# Patient Record
Sex: Female | Born: 1950 | Race: Black or African American | Hispanic: No | Smoking: Former smoker
Health system: Southern US, Community
[De-identification: ages and names within clinical notes are randomized; demographics above are authoritative.]

## PROBLEM LIST (undated history)

## (undated) DIAGNOSIS — I219 Acute myocardial infarction, unspecified: Secondary | ICD-10-CM

## (undated) DIAGNOSIS — J45909 Unspecified asthma, uncomplicated: Secondary | ICD-10-CM

## (undated) DIAGNOSIS — K635 Polyp of colon: Secondary | ICD-10-CM

## (undated) DIAGNOSIS — G8929 Other chronic pain: Secondary | ICD-10-CM

## (undated) DIAGNOSIS — Z95810 Presence of automatic (implantable) cardiac defibrillator: Secondary | ICD-10-CM

## (undated) DIAGNOSIS — E041 Nontoxic single thyroid nodule: Secondary | ICD-10-CM

## (undated) DIAGNOSIS — I209 Angina pectoris, unspecified: Secondary | ICD-10-CM

## (undated) DIAGNOSIS — N183 Chronic kidney disease, stage 3 unspecified: Secondary | ICD-10-CM

## (undated) DIAGNOSIS — I509 Heart failure, unspecified: Secondary | ICD-10-CM

## (undated) DIAGNOSIS — M1990 Unspecified osteoarthritis, unspecified site: Secondary | ICD-10-CM

## (undated) DIAGNOSIS — R519 Headache, unspecified: Secondary | ICD-10-CM

## (undated) DIAGNOSIS — J449 Chronic obstructive pulmonary disease, unspecified: Secondary | ICD-10-CM

## (undated) DIAGNOSIS — K56609 Unspecified intestinal obstruction, unspecified as to partial versus complete obstruction: Secondary | ICD-10-CM

## (undated) DIAGNOSIS — K589 Irritable bowel syndrome without diarrhea: Secondary | ICD-10-CM

## (undated) DIAGNOSIS — I2510 Atherosclerotic heart disease of native coronary artery without angina pectoris: Secondary | ICD-10-CM

## (undated) DIAGNOSIS — M797 Fibromyalgia: Secondary | ICD-10-CM

## (undated) HISTORY — PX: ABDOMINAL HYSTERECTOMY: SHX81

## (undated) HISTORY — DX: Other chronic pain: G89.29

## (undated) HISTORY — DX: Unspecified osteoarthritis, unspecified site: M19.90

## (undated) HISTORY — DX: Polyp of colon: K63.5

## (undated) HISTORY — DX: Chronic obstructive pulmonary disease, unspecified: J44.9

## (undated) HISTORY — DX: Acute myocardial infarction, unspecified: I21.9

## (undated) HISTORY — DX: Atherosclerotic heart disease of native coronary artery without angina pectoris: I25.10

## (undated) HISTORY — DX: Unspecified intestinal obstruction, unspecified as to partial versus complete obstruction: K56.609

## (undated) HISTORY — DX: Irritable bowel syndrome, unspecified: K58.9

## (undated) HISTORY — PX: BLEPHAROPLASTY: SUR158

## (undated) HISTORY — DX: Unspecified asthma, uncomplicated: J45.909

## (undated) HISTORY — DX: Headache, unspecified: R51.9

## (undated) HISTORY — PX: COLON SURGERY: SHX602

## (undated) HISTORY — PX: REFRACTIVE SURGERY: SHX103

## (undated) MED FILL — Ferumoxytol Inj 510 MG/17ML (30 MG/ML) (Elemental Fe): INTRAVENOUS | Qty: 17 | Status: AC

---

## 2012-04-19 DIAGNOSIS — H02409 Unspecified ptosis of unspecified eyelid: Secondary | ICD-10-CM | POA: Insufficient documentation

## 2013-08-16 DIAGNOSIS — Z8249 Family history of ischemic heart disease and other diseases of the circulatory system: Secondary | ICD-10-CM | POA: Insufficient documentation

## 2013-08-16 HISTORY — DX: Family history of ischemic heart disease and other diseases of the circulatory system: Z82.49

## 2013-12-08 DIAGNOSIS — E1122 Type 2 diabetes mellitus with diabetic chronic kidney disease: Secondary | ICD-10-CM | POA: Insufficient documentation

## 2013-12-08 DIAGNOSIS — N1832 Chronic kidney disease, stage 3b: Secondary | ICD-10-CM | POA: Insufficient documentation

## 2013-12-08 DIAGNOSIS — E119 Type 2 diabetes mellitus without complications: Secondary | ICD-10-CM

## 2013-12-08 HISTORY — DX: Type 2 diabetes mellitus without complications: E11.9

## 2014-04-13 DIAGNOSIS — I2510 Atherosclerotic heart disease of native coronary artery without angina pectoris: Secondary | ICD-10-CM

## 2014-04-13 HISTORY — DX: Atherosclerotic heart disease of native coronary artery without angina pectoris: I25.10

## 2014-11-08 DIAGNOSIS — H409 Unspecified glaucoma: Secondary | ICD-10-CM

## 2014-11-08 DIAGNOSIS — R3915 Urgency of urination: Secondary | ICD-10-CM | POA: Insufficient documentation

## 2014-11-08 DIAGNOSIS — R351 Nocturia: Secondary | ICD-10-CM | POA: Insufficient documentation

## 2014-11-08 DIAGNOSIS — Z90710 Acquired absence of both cervix and uterus: Secondary | ICD-10-CM | POA: Insufficient documentation

## 2014-11-08 DIAGNOSIS — G43909 Migraine, unspecified, not intractable, without status migrainosus: Secondary | ICD-10-CM

## 2014-11-08 DIAGNOSIS — N952 Postmenopausal atrophic vaginitis: Secondary | ICD-10-CM

## 2014-11-08 DIAGNOSIS — N3941 Urge incontinence: Secondary | ICD-10-CM | POA: Insufficient documentation

## 2014-11-08 DIAGNOSIS — I4949 Other premature depolarization: Secondary | ICD-10-CM

## 2014-11-08 DIAGNOSIS — R3982 Chronic bladder pain: Secondary | ICD-10-CM

## 2014-11-08 DIAGNOSIS — I10 Essential (primary) hypertension: Secondary | ICD-10-CM

## 2014-11-08 HISTORY — DX: Chronic bladder pain: R39.82

## 2014-11-08 HISTORY — DX: Unspecified glaucoma: H40.9

## 2014-11-08 HISTORY — DX: Essential (primary) hypertension: I10

## 2014-11-08 HISTORY — DX: Migraine, unspecified, not intractable, without status migrainosus: G43.909

## 2014-11-08 HISTORY — DX: Other premature depolarization: I49.49

## 2014-11-08 HISTORY — DX: Postmenopausal atrophic vaginitis: N95.2

## 2014-12-22 DIAGNOSIS — R109 Unspecified abdominal pain: Secondary | ICD-10-CM | POA: Insufficient documentation

## 2014-12-22 DIAGNOSIS — R1030 Lower abdominal pain, unspecified: Secondary | ICD-10-CM | POA: Insufficient documentation

## 2014-12-22 DIAGNOSIS — K5904 Chronic idiopathic constipation: Secondary | ICD-10-CM

## 2014-12-22 HISTORY — DX: Chronic idiopathic constipation: K59.04

## 2016-01-22 DIAGNOSIS — I10 Essential (primary) hypertension: Secondary | ICD-10-CM | POA: Insufficient documentation

## 2016-01-22 HISTORY — DX: Essential (primary) hypertension: I10

## 2017-04-07 DIAGNOSIS — R079 Chest pain, unspecified: Secondary | ICD-10-CM | POA: Insufficient documentation

## 2017-04-08 DIAGNOSIS — I739 Peripheral vascular disease, unspecified: Secondary | ICD-10-CM | POA: Insufficient documentation

## 2017-04-08 HISTORY — DX: Peripheral vascular disease, unspecified: I73.9

## 2018-03-18 DIAGNOSIS — K5732 Diverticulitis of large intestine without perforation or abscess without bleeding: Secondary | ICD-10-CM | POA: Insufficient documentation

## 2018-03-18 DIAGNOSIS — J449 Chronic obstructive pulmonary disease, unspecified: Secondary | ICD-10-CM

## 2018-03-18 HISTORY — DX: Diverticulitis of large intestine without perforation or abscess without bleeding: K57.32

## 2018-03-18 HISTORY — DX: Chronic obstructive pulmonary disease, unspecified: J44.9

## 2018-05-20 DIAGNOSIS — K5792 Diverticulitis of intestine, part unspecified, without perforation or abscess without bleeding: Secondary | ICD-10-CM | POA: Insufficient documentation

## 2018-05-20 HISTORY — DX: Diverticulitis of intestine, part unspecified, without perforation or abscess without bleeding: K57.92

## 2018-05-28 DIAGNOSIS — D72829 Elevated white blood cell count, unspecified: Secondary | ICD-10-CM | POA: Insufficient documentation

## 2018-05-28 HISTORY — DX: Elevated white blood cell count, unspecified: D72.829

## 2018-12-01 HISTORY — PX: CORONARY ANGIOPLASTY WITH STENT PLACEMENT: SHX49

## 2019-01-13 DIAGNOSIS — M5030 Other cervical disc degeneration, unspecified cervical region: Secondary | ICD-10-CM

## 2019-01-13 DIAGNOSIS — G8929 Other chronic pain: Secondary | ICD-10-CM

## 2019-01-13 DIAGNOSIS — K219 Gastro-esophageal reflux disease without esophagitis: Secondary | ICD-10-CM | POA: Insufficient documentation

## 2019-01-13 DIAGNOSIS — D509 Iron deficiency anemia, unspecified: Secondary | ICD-10-CM | POA: Insufficient documentation

## 2019-01-13 DIAGNOSIS — Z720 Tobacco use: Secondary | ICD-10-CM | POA: Insufficient documentation

## 2019-01-13 DIAGNOSIS — Z9889 Other specified postprocedural states: Secondary | ICD-10-CM | POA: Insufficient documentation

## 2019-01-13 DIAGNOSIS — E782 Mixed hyperlipidemia: Secondary | ICD-10-CM | POA: Insufficient documentation

## 2019-01-13 DIAGNOSIS — M5442 Lumbago with sciatica, left side: Secondary | ICD-10-CM | POA: Insufficient documentation

## 2019-01-13 HISTORY — DX: Iron deficiency anemia, unspecified: D50.9

## 2019-01-13 HISTORY — DX: Other cervical disc degeneration, unspecified cervical region: M50.30

## 2019-01-13 HISTORY — DX: Mixed hyperlipidemia: E78.2

## 2019-01-13 HISTORY — DX: Other chronic pain: G89.29

## 2019-01-13 HISTORY — DX: Gastro-esophageal reflux disease without esophagitis: K21.9

## 2019-01-13 HISTORY — DX: Lumbago with sciatica, left side: M54.42

## 2019-01-23 DIAGNOSIS — Z7689 Persons encountering health services in other specified circumstances: Secondary | ICD-10-CM | POA: Insufficient documentation

## 2019-01-23 DIAGNOSIS — E559 Vitamin D deficiency, unspecified: Secondary | ICD-10-CM | POA: Insufficient documentation

## 2019-01-23 DIAGNOSIS — Z01810 Encounter for preprocedural cardiovascular examination: Secondary | ICD-10-CM | POA: Insufficient documentation

## 2019-02-22 DIAGNOSIS — Z8619 Personal history of other infectious and parasitic diseases: Secondary | ICD-10-CM | POA: Insufficient documentation

## 2019-02-22 DIAGNOSIS — E889 Metabolic disorder, unspecified: Secondary | ICD-10-CM

## 2019-02-22 DIAGNOSIS — J302 Other seasonal allergic rhinitis: Secondary | ICD-10-CM | POA: Insufficient documentation

## 2019-02-22 DIAGNOSIS — G63 Polyneuropathy in diseases classified elsewhere: Secondary | ICD-10-CM

## 2019-02-22 DIAGNOSIS — E13319 Other specified diabetes mellitus with unspecified diabetic retinopathy without macular edema: Secondary | ICD-10-CM

## 2019-02-22 HISTORY — DX: Polyneuropathy in diseases classified elsewhere: G63

## 2019-02-22 HISTORY — DX: Other specified diabetes mellitus with unspecified diabetic retinopathy without macular edema: E13.319

## 2019-02-22 HISTORY — DX: Metabolic disorder, unspecified: E88.9

## 2019-02-24 ENCOUNTER — Other Ambulatory Visit: Payer: Self-pay | Admitting: Orthopedic Surgery

## 2019-02-24 DIAGNOSIS — M5416 Radiculopathy, lumbar region: Secondary | ICD-10-CM

## 2019-02-24 DIAGNOSIS — E1142 Type 2 diabetes mellitus with diabetic polyneuropathy: Secondary | ICD-10-CM

## 2019-02-24 HISTORY — DX: Type 2 diabetes mellitus with diabetic polyneuropathy: E11.42

## 2019-03-29 DIAGNOSIS — F331 Major depressive disorder, recurrent, moderate: Secondary | ICD-10-CM | POA: Insufficient documentation

## 2019-04-06 DIAGNOSIS — S99922A Unspecified injury of left foot, initial encounter: Secondary | ICD-10-CM | POA: Insufficient documentation

## 2019-04-12 DIAGNOSIS — Z4889 Encounter for other specified surgical aftercare: Secondary | ICD-10-CM | POA: Insufficient documentation

## 2019-04-20 ENCOUNTER — Other Ambulatory Visit: Payer: Medicare Other

## 2019-04-20 DIAGNOSIS — M7061 Trochanteric bursitis, right hip: Secondary | ICD-10-CM

## 2019-04-20 HISTORY — DX: Trochanteric bursitis, right hip: M70.61

## 2019-05-31 DIAGNOSIS — I519 Heart disease, unspecified: Secondary | ICD-10-CM

## 2019-05-31 HISTORY — DX: Heart disease, unspecified: I51.9

## 2019-08-06 DIAGNOSIS — R739 Hyperglycemia, unspecified: Secondary | ICD-10-CM

## 2019-08-06 DIAGNOSIS — I161 Hypertensive emergency: Secondary | ICD-10-CM | POA: Insufficient documentation

## 2019-08-06 DIAGNOSIS — I249 Acute ischemic heart disease, unspecified: Secondary | ICD-10-CM

## 2019-08-06 DIAGNOSIS — I447 Left bundle-branch block, unspecified: Secondary | ICD-10-CM

## 2019-08-06 DIAGNOSIS — I160 Hypertensive urgency: Secondary | ICD-10-CM

## 2019-09-12 DIAGNOSIS — J329 Chronic sinusitis, unspecified: Secondary | ICD-10-CM | POA: Insufficient documentation

## 2019-09-12 DIAGNOSIS — R5383 Other fatigue: Secondary | ICD-10-CM | POA: Insufficient documentation

## 2019-10-05 DIAGNOSIS — R079 Chest pain, unspecified: Secondary | ICD-10-CM

## 2019-11-05 DIAGNOSIS — G629 Polyneuropathy, unspecified: Secondary | ICD-10-CM | POA: Insufficient documentation

## 2019-11-05 DIAGNOSIS — F32A Depression, unspecified: Secondary | ICD-10-CM | POA: Insufficient documentation

## 2019-11-05 HISTORY — DX: Depression, unspecified: F32.A

## 2019-11-08 DIAGNOSIS — N2889 Other specified disorders of kidney and ureter: Secondary | ICD-10-CM

## 2019-11-08 HISTORY — DX: Other specified disorders of kidney and ureter: N28.89

## 2019-11-15 DIAGNOSIS — Z7901 Long term (current) use of anticoagulants: Secondary | ICD-10-CM | POA: Insufficient documentation

## 2019-11-15 DIAGNOSIS — Z7982 Long term (current) use of aspirin: Secondary | ICD-10-CM | POA: Insufficient documentation

## 2019-12-12 DIAGNOSIS — D649 Anemia, unspecified: Secondary | ICD-10-CM | POA: Insufficient documentation

## 2019-12-12 DIAGNOSIS — R7989 Other specified abnormal findings of blood chemistry: Secondary | ICD-10-CM

## 2019-12-12 DIAGNOSIS — Z862 Personal history of diseases of the blood and blood-forming organs and certain disorders involving the immune mechanism: Secondary | ICD-10-CM | POA: Insufficient documentation

## 2019-12-12 HISTORY — DX: Anemia, unspecified: D64.9

## 2019-12-12 HISTORY — DX: Other specified abnormal findings of blood chemistry: R79.89

## 2019-12-27 DIAGNOSIS — L600 Ingrowing nail: Secondary | ICD-10-CM | POA: Insufficient documentation

## 2020-01-02 DIAGNOSIS — D509 Iron deficiency anemia, unspecified: Secondary | ICD-10-CM

## 2020-01-07 DIAGNOSIS — R197 Diarrhea, unspecified: Secondary | ICD-10-CM | POA: Insufficient documentation

## 2020-01-14 DIAGNOSIS — M2550 Pain in unspecified joint: Secondary | ICD-10-CM | POA: Insufficient documentation

## 2020-01-14 DIAGNOSIS — M797 Fibromyalgia: Secondary | ICD-10-CM | POA: Insufficient documentation

## 2020-01-14 HISTORY — DX: Fibromyalgia: M79.7

## 2020-01-14 HISTORY — DX: Pain in unspecified joint: M25.50

## 2020-01-24 DIAGNOSIS — M79674 Pain in right toe(s): Secondary | ICD-10-CM | POA: Insufficient documentation

## 2020-03-13 DIAGNOSIS — H00014 Hordeolum externum left upper eyelid: Secondary | ICD-10-CM

## 2020-03-13 DIAGNOSIS — E538 Deficiency of other specified B group vitamins: Secondary | ICD-10-CM

## 2020-03-13 DIAGNOSIS — R7989 Other specified abnormal findings of blood chemistry: Secondary | ICD-10-CM

## 2020-03-13 HISTORY — DX: Hordeolum externum left upper eyelid: H00.014

## 2020-03-13 HISTORY — DX: Deficiency of other specified B group vitamins: E53.8

## 2020-03-13 HISTORY — DX: Other specified abnormal findings of blood chemistry: R79.89

## 2020-04-02 DIAGNOSIS — D509 Iron deficiency anemia, unspecified: Secondary | ICD-10-CM

## 2020-04-11 DIAGNOSIS — M436 Torticollis: Secondary | ICD-10-CM | POA: Insufficient documentation

## 2020-05-14 DIAGNOSIS — L080 Pyoderma: Secondary | ICD-10-CM | POA: Insufficient documentation

## 2020-06-22 DIAGNOSIS — I340 Nonrheumatic mitral (valve) insufficiency: Secondary | ICD-10-CM

## 2020-06-23 DIAGNOSIS — I5020 Unspecified systolic (congestive) heart failure: Secondary | ICD-10-CM

## 2020-06-23 DIAGNOSIS — I519 Heart disease, unspecified: Secondary | ICD-10-CM

## 2020-06-23 DIAGNOSIS — I255 Ischemic cardiomyopathy: Secondary | ICD-10-CM

## 2020-06-23 DIAGNOSIS — I2510 Atherosclerotic heart disease of native coronary artery without angina pectoris: Secondary | ICD-10-CM

## 2020-06-23 DIAGNOSIS — E119 Type 2 diabetes mellitus without complications: Secondary | ICD-10-CM

## 2020-06-23 DIAGNOSIS — I10 Essential (primary) hypertension: Secondary | ICD-10-CM

## 2020-06-23 DIAGNOSIS — E669 Obesity, unspecified: Secondary | ICD-10-CM

## 2020-06-23 DIAGNOSIS — E785 Hyperlipidemia, unspecified: Secondary | ICD-10-CM

## 2020-06-24 DIAGNOSIS — I5020 Unspecified systolic (congestive) heart failure: Secondary | ICD-10-CM | POA: Diagnosis not present

## 2020-06-24 DIAGNOSIS — I255 Ischemic cardiomyopathy: Secondary | ICD-10-CM | POA: Diagnosis not present

## 2020-06-24 DIAGNOSIS — E119 Type 2 diabetes mellitus without complications: Secondary | ICD-10-CM | POA: Diagnosis not present

## 2020-06-24 DIAGNOSIS — I2510 Atherosclerotic heart disease of native coronary artery without angina pectoris: Secondary | ICD-10-CM | POA: Diagnosis not present

## 2020-06-25 DIAGNOSIS — I255 Ischemic cardiomyopathy: Secondary | ICD-10-CM | POA: Diagnosis not present

## 2020-06-25 DIAGNOSIS — I5020 Unspecified systolic (congestive) heart failure: Secondary | ICD-10-CM | POA: Diagnosis not present

## 2020-06-25 DIAGNOSIS — I2510 Atherosclerotic heart disease of native coronary artery without angina pectoris: Secondary | ICD-10-CM | POA: Diagnosis not present

## 2020-06-25 DIAGNOSIS — E119 Type 2 diabetes mellitus without complications: Secondary | ICD-10-CM | POA: Diagnosis not present

## 2020-06-26 ENCOUNTER — Encounter: Payer: Self-pay | Admitting: Cardiology

## 2020-06-26 ENCOUNTER — Encounter: Payer: Self-pay | Admitting: *Deleted

## 2020-06-26 DIAGNOSIS — E669 Obesity, unspecified: Secondary | ICD-10-CM | POA: Insufficient documentation

## 2020-06-26 DIAGNOSIS — E66811 Obesity, class 1: Secondary | ICD-10-CM

## 2020-06-26 DIAGNOSIS — Z6831 Body mass index (BMI) 31.0-31.9, adult: Secondary | ICD-10-CM

## 2020-06-26 DIAGNOSIS — E6609 Other obesity due to excess calories: Secondary | ICD-10-CM

## 2020-06-26 HISTORY — DX: Obesity, class 1: E66.811

## 2020-06-26 HISTORY — DX: Body mass index (BMI) 31.0-31.9, adult: Z68.31

## 2020-06-26 HISTORY — DX: Other obesity due to excess calories: E66.09

## 2020-06-27 ENCOUNTER — Ambulatory Visit (INDEPENDENT_AMBULATORY_CARE_PROVIDER_SITE_OTHER): Payer: Medicare Other | Admitting: Cardiology

## 2020-06-27 ENCOUNTER — Other Ambulatory Visit: Payer: Self-pay

## 2020-06-27 ENCOUNTER — Encounter: Payer: Self-pay | Admitting: *Deleted

## 2020-06-27 VITALS — BP 102/64 | HR 88 | Ht 65.0 in | Wt 179.0 lb

## 2020-06-27 DIAGNOSIS — I519 Heart disease, unspecified: Secondary | ICD-10-CM | POA: Diagnosis not present

## 2020-06-27 DIAGNOSIS — I10 Essential (primary) hypertension: Secondary | ICD-10-CM

## 2020-06-27 DIAGNOSIS — I2510 Atherosclerotic heart disease of native coronary artery without angina pectoris: Secondary | ICD-10-CM | POA: Diagnosis not present

## 2020-06-27 DIAGNOSIS — E119 Type 2 diabetes mellitus without complications: Secondary | ICD-10-CM

## 2020-06-27 DIAGNOSIS — I739 Peripheral vascular disease, unspecified: Secondary | ICD-10-CM | POA: Diagnosis not present

## 2020-06-27 DIAGNOSIS — Z720 Tobacco use: Secondary | ICD-10-CM

## 2020-06-27 MED ORDER — ENTRESTO 24-26 MG PO TABS: 1.0000 | ORAL_TABLET | Freq: Two times a day (BID) | ORAL | 2 refills | Status: DC

## 2020-06-27 NOTE — Progress Notes (Signed)
Cardiology Office Note:    Date:  06/27/2020   ID:  [name], DOB 1950-12-11, MRN [id number]  PCP:  Laurel Dimmer, FNP   Cardiologist:  Thomasene Ripple, DO  Electrophysiologist:  None   Referring MD: No ref. provider found    hospital follow-up  History of Present Illness:    Karen Warner is a 69 y.o. female with past medical history of coronary artery disease status post left heart catheterization in September 2020 with DES to LAD, based on echo done in June 2020 ischemic cardiomyopathy EF 40-45%, hypertension, hyperlipidemia,  smoker presented to the read of [facility] to be evaluated for shortness of breath.  Based on my chart review on Care everywhere the patient follows with Dr. Desma Maxim with [facility] and was last seen by him in March 2021.  At that time it appeared that everything was stable.  He discussed her echocardiogram showing LV dysfunction of 40-45% on echocardiogram which was done in June 2020. Also discussion that was done was for smoking cessation.     I did see the patient around of hospital after she presented to the emergency room for  shortness of breath.  She was admitted for acute exacerbation of systolic heart failure. Her echo was shown to have a depressed ejection fraction EF between 25 - 30%. Her medications were adjusted while inpatient.   The patient is here today for a follow up visit.  She reports that she has been experiencing some musculoskeletal pain as well as has been very tired since her discharge from the hospital.  No other complaints at this time.  Past Medical History:  Diagnosis Date  . Arthralgia 01/14/2020  . Atherosclerotic heart disease of native coronary artery without angina pectoris 04/13/2014  . Chronic bilateral low back pain with bilateral sciatica 01/13/2019  . Chronic bladder pain 11/08/2014   Last Assessment & Plan:  Formatting of this note might be different from the original. Patient  has chronic pain syndrome in general I think this is unfortunately worsening her pelvic pain and bladder pain her examination today was very reassuring I am advised her to use the estrogen cream I did start her on Elavil 10 milligrams at that time if she does tolerated will increase it to 25 milligrams.   . Chronic idiopathic constipation 12/22/2014   Formatting of this note might be different from the original. Last Assessment & Plan:  For better bowel emptying please use either Citrucel / Benefiber start with 2 tablespoons daily and titrate up or down to effect.  Also please use glycerin suppositories as needed to assist in evacuation. Last Assessment & Plan:  Formatting of this note might be different from the original. For better bowel empt  . Chronic obstructive pulmonary disease, unspecified (HCC) 03/18/2018  . Class 1 obesity due to excess calories with serious comorbidity and body mass index (BMI) of 31.0 to 31.9 in adult 06/26/2020  . Coronary artery disease   . DDD (degenerative disc disease), cervical 01/13/2019  . Depression 11/05/2019  . Diabetic polyneuropathy associated with type 2 diabetes mellitus (HCC) 02/24/2019  . Diverticulitis 05/20/2018  . Diverticulitis of colon 03/18/2018  . Family history of ischemic heart disease (IHD) 08/16/2013  . Fibromyalgia affecting multiple sites 01/14/2020  . Gastro-esophageal reflux disease without esophagitis 01/13/2019  . Glaucoma 11/08/2014  . History of anemia 12/12/2019  . Hordeolum externum of left upper eyelid 03/13/2020  . Hypertension 11/08/2014  . Iron deficiency anemia 01/13/2019  .  Left renal mass 11/08/2019  . Leukocytosis 05/28/2018  . Low vitamin B12 level 03/13/2020  . Low vitamin D level 12/12/2019  . LV dysfunction 05/31/2019  . Malignant essential hypertension 01/22/2016  . Migraine, unspecified, not intractable, without status migrainosus 11/08/2014  . Mixed hyperlipidemia 01/13/2019  . Myocardial infarction (HCC)   . Other premature beats  11/08/2014  . 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 PVD (peripheral vascular disease) (HCC) 04/08/2017  . 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 Trochanteric bursitis of right hip 04/20/2019  . Type 2 diabetes mellitus without complications (HCC) 12/08/2013  . Vaginal atrophy 11/08/2014   Formatting of this note might be different from the original. Last Assessment & Plan:  For vaginal atrophy please place a pea size dab of Estrogen vaginal cream  into the vagina 3 times a week ( Monday, Wednesday, Friday) Last Assessment & Plan:  Formatting of this note might be different from the original. For vaginal atrophy please place a pea size dab of Estrogen vaginal cream  into the vagina     Past Surgical History:  Procedure Laterality Date  . ABDOMINAL HYSTERECTOMY    . BLEPHAROPLASTY Bilateral   . COLON SURGERY    . CORONARY ANGIOPLASTY WITH STENT PLACEMENT  2020   X3  . REFRACTIVE SURGERY Left    [place]    Current Medications: Current Meds  Medication Sig  . acetaminophen (TYLENOL) 325 MG tablet Take 2 tablets by mouth every 6 (six) hours as needed.  . ACETAMINOPHEN-BUTALBITAL 50-325 MG TABS Take 1 tablet by mouth every 4 (four) hours as needed.  Marland Kitchen albuterol (PROVENTIL) (2.5 MG/3ML) 0.083% nebulizer solution INL CONTENTS OF 1 VIAL VIA NEBULIZER Q 4 H PRF WHEEZING OR COUGH  . albuterol (VENTOLIN HFA) 108 (90 Base) MCG/ACT inhaler Inhale 2 puffs into the lungs every 6 (six) hours as needed.  Marland Kitchen aspirin 81 MG EC tablet 81 mg daily.  . baclofen (LIORESAL) 10 MG tablet Take 10 mg by mouth in the morning and at bedtime.  . budesonide (PULMICORT) 1 MG/2ML nebulizer solution Take 1 mg by nebulization in the morning and at bedtime.  . carvedilol (COREG) 6.25 MG tablet Take 6.25 mg by mouth 2 (two) times daily with a meal.  . dicyclomine (BENTYL) 10 MG capsule Take 10 mg by mouth every 6 (six) hours as needed for spasms.  . ergocalciferol (VITAMIN D2) 1.25 MG  (50000 UT) capsule Take 50,000 Units by mouth once a week.  . esomeprazole (NEXIUM) 40 MG capsule Take 40 mg by mouth daily at 12 noon.  . famotidine (PEPCID) 40 MG tablet Take 40 mg by mouth at bedtime.  . ferrous sulfate 324 MG TBEC Take 324 mg by mouth daily with breakfast.  . folic acid (FOLVITE) 1 MG tablet Take 1 mg by mouth daily.  . furosemide (LASIX) 40 MG tablet Take 40 mg by mouth daily.  . insulin lispro (HUMALOG) 100 UNIT/ML KwikPen Inject 16 Units into the skin 3 (three) times daily. Administer 16 units three times daily before a meal per sliding scale  . magnesium oxide (MAG-OX) 400 MG tablet Take 400 mg by mouth 2 (two) times daily.  . nitroGLYCERIN (NITROSTAT) 0.4 MG SL tablet Place 0.4 mg under the tongue every 5 (five) minutes as needed for chest pain.  . pregabalin (LYRICA) 50 MG capsule Take 50 mg by mouth 3 (three) times daily.  . rosuvastatin (CRESTOR) 40 MG tablet Take 40 mg by mouth  daily.  . spironolactone (ALDACTONE) 25 MG tablet Take 12.5 mg by mouth daily.  . SUMAtriptan (IMITREX) 50 MG tablet 50 mg as directed.  . ticagrelor (BRILINTA) 90 MG TABS tablet Take 90 mg by mouth in the morning and at bedtime.  . vitamin B-12 (CYANOCOBALAMIN) 1000 MCG tablet Take 1,000 mcg by mouth daily.  . [DISCONTINUED] irbesartan (AVAPRO) 75 MG tablet Take 75 mg by mouth daily.     Allergies:   Bee venom, Amoxicillin-pot clavulanate, Buprenorphine hcl, Clarithromycin, Duloxetine hcl, Hydrocodone, Lactose, Liraglutide, Oxycodone, and Tramadol hcl   Social History   Socioeconomic History  . Marital status: Married    Spouse name: Not on file  . Number of children: Not on file  . Years of education: Not on file  . Highest education level: Not on file  Occupational History  . Not on file  Tobacco Use  . Smoking status: Former Games developer  . Smokeless tobacco: Never Used  Substance and Sexual Activity  . Alcohol use: Never  . Drug use: Never  . Sexual activity: Not on file  Other  Topics Concern  . Not on file  Social History Narrative  . Not on file   Social Determinants of Health   Financial Resource Strain:   . Difficulty of Paying Living Expenses:   Food Insecurity:   . Worried About Programme researcher, broadcasting/film/video in the Last Year:   . Barista in the Last Year:   Transportation Needs:   . Freight forwarder (Medical):   Marland Kitchen Lack of Transportation (Non-Medical):   Physical Activity:   . Days of Exercise per Week:   . Minutes of Exercise per Session:   Stress:   . Feeling of Stress :   Social Connections:   . Frequency of Communication with Friends and Family:   . Frequency of Social Gatherings with Friends and Family:   . Attends Religious Services:   . Active Member of Clubs or Organizations:   . Attends Banker Meetings:   Marland Kitchen Marital Status:      Family History: The patient's family history includes Coronary artery disease in her mother; Diabetes in her daughter, maternal grandmother, and mother; Glaucoma in her mother; Hypertension in her mother; Lung cancer in her mother; Migraines in her father and son.  ROS:   Review of Systems  Constitution: Negative for decreased appetite, fever and weight gain.  HENT: Negative for congestion, ear discharge, hoarse voice and sore throat.   Eyes: Negative for discharge, redness, vision loss in right eye and visual halos.  Cardiovascular: Negative for chest pain, dyspnea on exertion, leg swelling, orthopnea and palpitations.  Respiratory: Negative for cough, hemoptysis, shortness of breath and snoring.   Endocrine: Negative for heat intolerance and polyphagia.  Hematologic/Lymphatic: Negative for bleeding problem. Does not bruise/bleed easily.  Skin: Negative for flushing, nail changes, rash and suspicious lesions.  Musculoskeletal: Negative for arthritis, joint pain, muscle cramps, myalgias, neck pain and stiffness.  Gastrointestinal: Negative for abdominal pain, bowel incontinence, diarrhea and  excessive appetite.  Genitourinary: Negative for decreased libido, genital sores and incomplete emptying.  Neurological: Negative for brief paralysis, focal weakness, headaches and loss of balance.  Psychiatric/Behavioral: Negative for altered mental status, depression and suicidal ideas.  Allergic/Immunologic: Negative for HIV exposure and persistent infections.    EKGs/Labs/Other Studies Reviewed:    The following studies were reviewed today:   EKG: None today her EKG from [facility] which was done on June 22, 2020 which  showed normal sinus rhythm and left bundle branch block was again reviewed by me today.  Echo FINDINGS June 22, 2020 ------- Procedure:2D imaging, m-mode, color and spectral Doppler were obtained and reviewed. ECG rhythm:Sinus rhythm. Study quality:This was a technically adequate study. Left Ventricle:The left ventricle is mildly dilated.   There is borderline concentric left ventricular hypertrophy.   Overall left ventricular systolic function is severely impaired with, an EF between 25 - 30 %.   The diastolic filling pattern indicates impaired relaxation.  Mid Inferoseptum, Inferior, Anteroseptal akinesis. Apical akinesis.  Mid anterior LV wall motion is akinetic.    Mid inferoseptal LV wall motion is akinetic.    Mid anteroseptal LV wall motion is akinetic.    Apical anterior LV wall motion is dyskinetic.   Apical lateral LV wall motion is dyskinetic.   Apical inferior LV wall motion is dyskinetic.   Apical septum LV wall motion is dyskinetic. Right Ventricle:The right ventricle is normal in size and function. Left Atrium:The left atrium is normal in size. Right Atrium:The right atrium is normal in size and function. ASD/VSD:No evidence of interatrial communication by color flow doppler analysis. Aortic Valve:The aortic valve is trileaflet and appears structurally normal.   There is mild aortic valve sclerosis.   There is no evidence of aortic regurgitation.    There is no evidence of aortic stenosis. Mitral Valve:Normal appearing mitral valve.    Mild mitral regurgitation is present. Tricuspid Valve:The tricuspid valve appears structurally normal.   Trace tricuspid regurgitation present. Pulmonic Valve:The pulmonic valve is normal.   There is no pulmonic regurgitation present. Aorta:The aortic root, ascending aorta and aortic arch appear normal. ZOX:WRUEAV inferior vena cava with normal inspiratory collapse. Pericardium:There is no pericardial effusion.  CONCLUSIONS ----------- 1. Overall left ventricular systolic function is severely impaired with, an EF between 25 - 30 %. 2. Mid anterior LV wall motion is akinetic. 3. Mid inferoseptal LV wall motion is akinetic. 4. Mid anteroseptal LV wall motion is akinetic. 5. Apical anterior LV wall motion is dyskinetic. 6. Apical lateral LV wall motion is dyskinetic. 7. Apical inferior LV wall motion is dyskinetic. 8. Apical septum LV wall motion is dyskinetic. 9. The left atrium is normal in size. 10. Mild mitral regurgitation is present. 11. Trace tricuspid regurgitation present  Recent Labs: No results found for requested labs within last 8760 hours.  Recent Lipid Panel No results found for: CHOL, TRIG, HDL, CHOLHDL, VLDL, LDLCALC, LDLDIRECT  Physical Exam:    VS:  BP (!) 102/64 (BP Location: Left Arm, Patient Position: Sitting, Cuff Size: Normal)   Pulse 88   Ht 5\' 5"  (1.651 m)   Wt 179 lb (81.2 kg)   SpO2 98%   BMI 29.79 kg/m     Wt Readings from Last 3 Encounters:  06/27/20 179 lb (81.2 kg)  06/14/20 188 lb (85.3 kg)     GEN: Well nourished, well developed in no acute distress HEENT: Normal NECK: No JVD; No carotid bruits LYMPHATICS: No lymphadenopathy CARDIAC: S1S2 noted,RRR, no murmurs, rubs, gallops RESPIRATORY:  Clear to auscultation without rales, wheezing or rhonchi  ABDOMEN: Soft, non-tender, non-distended, +bowel sounds, no guarding. EXTREMITIES: No edema, No cyanosis,  no clubbing MUSCULOSKELETAL:  No deformity  SKIN: Warm and dry NEUROLOGIC:  Alert and oriented x 3, non-focal PSYCHIATRIC:  Normal affect, good insight  ASSESSMENT:    1. Atherosclerosis of native coronary artery of native heart without angina pectoris   2. Essential hypertension   3. LV dysfunction  4. Malignant essential hypertension   5. PVD (peripheral vascular disease) (HCC)   6. Type 2 diabetes mellitus without complication, unspecified whether long term insulin use (HCC)   7. Tobacco abuse    PLAN:     Heart failure with reduced ejection fraction-I did see the patient while hospitalized at [facility] her EF showed depressed 25-30% compared to 40 to 45% which was done in October 2020.  There is some adjustments made to her medication regimen.  She was started on spironolactone, continue on her carvedilol at a lower dose due to hypotension.  At this time given the fact that the patient was on irbesartan and had the decreased ejection fraction I am going to stop the irbesartan watch out for 3 days and then started patient on Entresto low-dose which she has been advised to start on Sunday morning.  The plan is to optimize her medication regimen then repeat her echocardiogram in 30 days.    If her medical management does not improve her LV function we will consider doing an ischemic evaluation.  At this time she does not complain of any anginal symptoms.  We will continue patient on her dual antiplatelet therapy with aspirin 81 mg daily as well as a 1 to 90 mg twice daily.  She is also on rosuvastatin 40 mg daily.  Hypertension-her blood pressure is acceptable at this time medication changes as noted above.  Hyperlipidemia-continue with Crestor.  Diabetes mellitus per her primary provider.  Tobacco use-cessation advised.  The patient is requesting that records to be sent to [facility] as she does have a friend who works in advance heart and lung failure and  she would like her to review her records.  I have asked the patient to have her friend also record request will be more than happy to share this information with her.  The patient is in agreement with the above plan. The patient left the office in stable condition.  The patient will follow up in 1 month or sooner if needed.   Medication Adjustments/Labs and Tests Ordered: Current medicines are reviewed at length with the patient today.  Concerns regarding medicines are outlined above.  No orders of the defined types were placed in this encounter.  Meds ordered this encounter  Medications  . sacubitril-valsartan (ENTRESTO) 24-26 MG    Sig: Take 1 tablet by mouth 2 (two) times daily.    Dispense:  60 tablet    Refill:  2    Patient Instructions  Medication Instructions:  Your physician has recommended you make the following change in your medication:   STOP: irbesartan   After being off irbesartan for 3 days START: Entresto 24/26 mg twice daily.    *If you gain 2 lbs in 3 days or 5 lbs in 1 week please cal lour office.   *If you need a refill on your cardiac medications before your next appointment, please call your pharmacy*   Lab Work: None.  If you have labs (blood work) drawn today and your tests are completely normal, you will receive your results only by: Marland Kitchen MyChart Message (if you have MyChart) OR . A paper copy in the mail If you have any lab test that is abnormal or we need to change your treatment, we will call you to review the results.   Testing/Procedures: None.    Follow-Up: At [facility], you and your health needs are our priority.  As part of our continuing mission to  provide you with exceptional heart care, we have created designated Provider Care Teams.  These Care Teams include your primary Cardiologist (physician) and Advanced Practice Providers (APPs -  Physician Assistants and Nurse Practitioners) who all work together to provide you with the care  you need, when you need it.  We recommend signing up for the patient portal called "MyChart".  Sign up information is provided on this After Visit Summary.  MyChart is used to connect with patients for Virtual Visits (Telemedicine).  Patients are able to view lab/test results, encounter notes, upcoming appointments, etc.  Non-urgent messages can be sent to your provider as well.   To learn more about what you can do with MyChart, go to [email or web address].    Your next appointment:   1 month(s)  The format for your next appointment:   In Person  Provider:   Thomasene Ripple, DO   Other Instructions  Sacubitril; Valsartan Oral Tablets What is this medicine? SACUBITRIL; VALSARTAN (sak UE bi tril; val SAR tan) is a combination of a neprilysin inhibitor and a an angiotensin II receptor blocker. It treats heart failure. This medicine may be used for other purposes; ask your health care provider or pharmacist if you have questions. COMMON BRAND NAME(S): Entresto What should I tell my health care provider before I take this medicine? They need to know if you have any of these conditions:  diabetes and take a medicine that contains aliskiren  kidney disease  liver disease  an unusual or allergic reaction to sacubitril; valsartan, drugs called angiotensin converting enzyme (ACE) inhibitors, angiotensin II receptor blockers (ARBs), other medicines, foods, dyes, or preservatives  pregnant or trying to get pregnant  breast-feeding How should I use this medicine? Take this drug by mouth. Take it as directed on the prescription label at the same time every day. You can take it with or without food. If it upsets your stomach, take it with food. Keep taking it unless your health care provider tells you to stop. Talk to your health care provider about the use of this drug in children. While it may be prescribed for children as young as 1 for selected conditions, precautions do apply. Overdosage:  If you think you have taken too much of this medicine contact a poison control center or emergency room at once. NOTE: This medicine is only for you. Do not share this medicine with others. What if I miss a dose? If you miss a dose, take it as soon as you can. If it is almost time for your next dose, take only that dose. Do not take double or extra doses. What may interact with this medicine? Do not take this medicine with any of the following medicines:  aliskiren if you have diabetes  angiotensin-converting enzyme (ACE) inhibitors, like benazepril, captopril, enalapril, fosinopril, lisinopril, or ramipril This medicine may also interact with the following medicines:  angiotensin II receptor blockers (ARBs) like azilsartan, candesartan, eprosartan, irbesartan, losartan, olmesartan, telmisartan, or valsartan  lithium  NSAIDS, medicines for pain and inflammation, like ibuprofen or naproxen  potassium-sparing diuretics like amiloride, spironolactone, and triamterene  potassium supplements This list may not describe all possible interactions. Give your health care provider a list of all the medicines, herbs, non-prescription drugs, or dietary supplements you use. Also tell them if you smoke, drink alcohol, or use illegal drugs. Some items may interact with your medicine. What should I watch for while using this medicine? Tell your doctor or healthcare professional if your symptoms do  not start to get better or if they get worse. Do not become pregnant while taking this medicine. Women should inform their doctor if they wish to become pregnant or think they might be pregnant. There is a potential for serious side effects to an unborn child. Talk to your health care professional or pharmacist for more information. You may get dizzy. Do not drive, use machinery, or do anything that needs mental alertness until you know how this medicine affects you. Do not stand or sit up quickly, especially if  you are an older patient. This reduces the risk of dizzy or fainting spells. Avoid alcoholic drinks; they can make you more dizzy. What side effects may I notice from receiving this medicine? Side effects that you should report to your doctor or health care professional as soon as possible:  allergic reactions like skin rash, itching or hives, swelling of the face, lips, or tongue  signs and symptoms of increased potassium like muscle weakness; chest pain; or fast, irregular heartbeat  signs and symptoms of kidney injury like trouble passing urine or change in the amount of urine  signs and symptoms of low blood pressure like feeling dizzy or lightheaded, or if you develop extreme fatigue Side effects that usually do not require medical attention (report to your doctor or health care professional if they continue or are bothersome):  cough This list may not describe all possible side effects. Call your doctor for medical advice about side effects. You may report side effects to FDA at 1-800-FDA-1088. Where should I keep my medicine? Keep out of the reach of children and pets. Store at room temperature between 20 and 25 degrees C (68 and 77 degrees F). Protect from moisture. Keep the container tightly closed. Throw away any unused drug after the expiration date. NOTE: This sheet is a summary. It may not cover all possible information. If you have questions about this medicine, talk to your doctor, pharmacist, or health care provider.  2020 Elsevier/Gold Standard (2019-06-22 16:03:07)      Adopting a Healthy Lifestyle.  Know what a healthy weight is for you (roughly BMI <25) and aim to maintain this   Aim for 7+ servings of fruits and vegetables daily   65-80+ fluid ounces of water or unsweet tea for healthy kidneys   Limit to max 1 drink of alcohol per day; avoid smoking/tobacco   Limit animal fats in diet for cholesterol and heart health - choose grass fed whenever available     Avoid highly processed foods, and foods high in saturated/trans fats   Aim for low stress - take time to unwind and care for your mental health   Aim for 150 min of moderate intensity exercise weekly for heart health, and weights twice weekly for bone health   Aim for 7-9 hours of sleep daily   When it comes to diets, agreement about the perfect plan isnt easy to find, even among the experts. Experts at the [facility] of [employer] developed an idea known as the Healthy Eating Plate. Just imagine a plate divided into logical, healthy portions.   The emphasis is on diet quality:   Load up on vegetables and fruits - one-half of your plate: Aim for color and variety, and remember that potatoes dont count.   Go for whole grains - one-quarter of your plate: Whole wheat, barley, wheat berries, quinoa, oats, brown rice, and foods made with them. If you want pasta, go with whole wheat pasta.  Protein power - one-quarter of your plate: Fish, chicken, beans, and nuts are all healthy, versatile protein sources. Limit red meat.   The diet, however, does go beyond the plate, offering a few other suggestions.   Use healthy plant oils, such as olive, canola, soy, corn, sunflower and peanut. Check the labels, and avoid partially hydrogenated oil, which have unhealthy trans fats.   If youre thirsty, drink water. Coffee and tea are good in moderation, but skip sugary drinks and limit milk and dairy products to one or two daily servings.   The type of carbohydrate in the diet is more important than the amount. Some sources of carbohydrates, such as vegetables, fruits, whole grains, and beans-are healthier than others.   Finally, stay active  Signed, Thomasene Ripple, DO  06/27/2020 2:40 PM    [facility]

## 2020-06-27 NOTE — Patient Instructions (Signed)
Medication Instructions:  Your physician has recommended you make the following change in your medication:   STOP: irbesartan   After being off irbesartan for 3 days START: Entresto 24/26 mg twice daily.    *If you gain 2 lbs in 3 days or 5 lbs in 1 week please cal lour office.   *If you need a refill on your cardiac medications before your next appointment, please call your pharmacy*   Lab Work: None.  If you have labs (blood work) drawn today and your tests are completely normal, you will receive your results only by:  MyChart Message (if you have MyChart) OR  A paper copy in the mail If you have any lab test that is abnormal or we need to change your treatment, we will call you to review the results.   Testing/Procedures: None.    Follow-Up: At [facility], you and your health needs are our priority.  As part of our continuing mission to provide you with exceptional heart care, we have created designated Provider Care Teams.  These Care Teams include your primary Cardiologist (physician) and Advanced Practice Providers (APPs -  Physician Assistants and Nurse Practitioners) who all work together to provide you with the care you need, when you need it.  We recommend signing up for the patient portal called "MyChart".  Sign up information is provided on this After Visit Summary.  MyChart is used to connect with patients for Virtual Visits (Telemedicine).  Patients are able to view lab/test results, encounter notes, upcoming appointments, etc.  Non-urgent messages can be sent to your provider as well.   To learn more about what you can do with MyChart, go to [email or web address].    Your next appointment:   1 month(s)  The format for your next appointment:   In Person  Provider:   Thomasene Ripple, DO   Other Instructions  Sacubitril; Valsartan Oral Tablets What is this medicine? SACUBITRIL; VALSARTAN (sak UE bi tril; val SAR tan) is a combination of a neprilysin  inhibitor and a an angiotensin II receptor blocker. It treats heart failure. This medicine may be used for other purposes; ask your health care provider or pharmacist if you have questions. COMMON BRAND NAME(S): Entresto What should I tell my health care provider before I take this medicine? They need to know if you have any of these conditions:  diabetes and take a medicine that contains aliskiren  kidney disease  liver disease  an unusual or allergic reaction to sacubitril; valsartan, drugs called angiotensin converting enzyme (ACE) inhibitors, angiotensin II receptor blockers (ARBs), other medicines, foods, dyes, or preservatives  pregnant or trying to get pregnant  breast-feeding How should I use this medicine? Take this drug by mouth. Take it as directed on the prescription label at the same time every day. You can take it with or without food. If it upsets your stomach, take it with food. Keep taking it unless your health care provider tells you to stop. Talk to your health care provider about the use of this drug in children. While it may be prescribed for children as young as 1 for selected conditions, precautions do apply. Overdosage: If you think you have taken too much of this medicine contact a poison control center or emergency room at once. NOTE: This medicine is only for you. Do not share this medicine with others. What if I miss a dose? If you miss a dose, take it as soon as you can. If it is almost  time for your next dose, take only that dose. Do not take double or extra doses. What may interact with this medicine? Do not take this medicine with any of the following medicines:  aliskiren if you have diabetes  angiotensin-converting enzyme (ACE) inhibitors, like benazepril, captopril, enalapril, fosinopril, lisinopril, or ramipril This medicine may also interact with the following medicines:  angiotensin II receptor blockers (ARBs) like azilsartan, candesartan,  eprosartan, irbesartan, losartan, olmesartan, telmisartan, or valsartan  lithium  NSAIDS, medicines for pain and inflammation, like ibuprofen or naproxen  potassium-sparing diuretics like amiloride, spironolactone, and triamterene  potassium supplements This list may not describe all possible interactions. Give your health care provider a list of all the medicines, herbs, non-prescription drugs, or dietary supplements you use. Also tell them if you smoke, drink alcohol, or use illegal drugs. Some items may interact with your medicine. What should I watch for while using this medicine? Tell your doctor or healthcare professional if your symptoms do not start to get better or if they get worse. Do not become pregnant while taking this medicine. Women should inform their doctor if they wish to become pregnant or think they might be pregnant. There is a potential for serious side effects to an unborn child. Talk to your health care professional or pharmacist for more information. You may get dizzy. Do not drive, use machinery, or do anything that needs mental alertness until you know how this medicine affects you. Do not stand or sit up quickly, especially if you are an older patient. This reduces the risk of dizzy or fainting spells. Avoid alcoholic drinks; they can make you more dizzy. What side effects may I notice from receiving this medicine? Side effects that you should report to your doctor or health care professional as soon as possible:  allergic reactions like skin rash, itching or hives, swelling of the face, lips, or tongue  signs and symptoms of increased potassium like muscle weakness; chest pain; or fast, irregular heartbeat  signs and symptoms of kidney injury like trouble passing urine or change in the amount of urine  signs and symptoms of low blood pressure like feeling dizzy or lightheaded, or if you develop extreme fatigue Side effects that usually do not require medical  attention (report to your doctor or health care professional if they continue or are bothersome):  cough This list may not describe all possible side effects. Call your doctor for medical advice about side effects. You may report side effects to FDA at 1-800-FDA-1088. Where should I keep my medicine? Keep out of the reach of children and pets. Store at room temperature between 20 and 25 degrees C (68 and 77 degrees F). Protect from moisture. Keep the container tightly closed. Throw away any unused drug after the expiration date. NOTE: This sheet is a summary. It may not cover all possible information. If you have questions about this medicine, talk to your doctor, pharmacist, or health care provider.  2020 Elsevier/Gold Standard (2019-06-22 16:03:07)

## 2020-07-13 ENCOUNTER — Encounter (HOSPITAL_BASED_OUTPATIENT_CLINIC_OR_DEPARTMENT_OTHER): Payer: Self-pay

## 2020-07-13 ENCOUNTER — Emergency Department (HOSPITAL_BASED_OUTPATIENT_CLINIC_OR_DEPARTMENT_OTHER)
Admission: EM | Admit: 2020-07-13 | Discharge: 2020-07-13 | Disposition: A | Discharge status: Home / Self Care | Payer: Medicare Other | Attending: Emergency Medicine | Admitting: Emergency Medicine

## 2020-07-13 ENCOUNTER — Other Ambulatory Visit: Payer: Self-pay

## 2020-07-13 ENCOUNTER — Telehealth: Payer: Self-pay | Admitting: Cardiology

## 2020-07-13 ENCOUNTER — Emergency Department (HOSPITAL_BASED_OUTPATIENT_CLINIC_OR_DEPARTMENT_OTHER): Payer: Medicare Other

## 2020-07-13 DIAGNOSIS — I110 Hypertensive heart disease with heart failure: Secondary | ICD-10-CM | POA: Diagnosis not present

## 2020-07-13 DIAGNOSIS — Z7982 Long term (current) use of aspirin: Secondary | ICD-10-CM | POA: Insufficient documentation

## 2020-07-13 DIAGNOSIS — I509 Heart failure, unspecified: Secondary | ICD-10-CM | POA: Diagnosis not present

## 2020-07-13 DIAGNOSIS — Z794 Long term (current) use of insulin: Secondary | ICD-10-CM | POA: Insufficient documentation

## 2020-07-13 DIAGNOSIS — I2510 Atherosclerotic heart disease of native coronary artery without angina pectoris: Secondary | ICD-10-CM | POA: Diagnosis not present

## 2020-07-13 DIAGNOSIS — Z79899 Other long term (current) drug therapy: Secondary | ICD-10-CM | POA: Diagnosis not present

## 2020-07-13 DIAGNOSIS — R0789 Other chest pain: Secondary | ICD-10-CM | POA: Diagnosis present

## 2020-07-13 DIAGNOSIS — E1142 Type 2 diabetes mellitus with diabetic polyneuropathy: Secondary | ICD-10-CM | POA: Insufficient documentation

## 2020-07-13 DIAGNOSIS — Z7951 Long term (current) use of inhaled steroids: Secondary | ICD-10-CM | POA: Insufficient documentation

## 2020-07-13 DIAGNOSIS — R0600 Dyspnea, unspecified: Secondary | ICD-10-CM | POA: Diagnosis not present

## 2020-07-13 DIAGNOSIS — J449 Chronic obstructive pulmonary disease, unspecified: Secondary | ICD-10-CM | POA: Insufficient documentation

## 2020-07-13 DIAGNOSIS — R079 Chest pain, unspecified: Secondary | ICD-10-CM | POA: Diagnosis not present

## 2020-07-13 DIAGNOSIS — R0602 Shortness of breath: Secondary | ICD-10-CM | POA: Insufficient documentation

## 2020-07-13 DIAGNOSIS — E11319 Type 2 diabetes mellitus with unspecified diabetic retinopathy without macular edema: Secondary | ICD-10-CM | POA: Diagnosis not present

## 2020-07-13 DIAGNOSIS — Z87891 Personal history of nicotine dependence: Secondary | ICD-10-CM | POA: Diagnosis not present

## 2020-07-13 HISTORY — DX: Heart failure, unspecified: I50.9

## 2020-07-13 LAB — BASIC METABOLIC PANEL
Anion gap: 10 (ref 5–15)
BUN: 19 mg/dL (ref 8–23)
CO2: 25 mmol/L (ref 22–32)
Calcium: 9.3 mg/dL (ref 8.9–10.3)
Chloride: 104 mmol/L (ref 98–111)
Creatinine, Ser: 1.47 mg/dL — ABNORMAL HIGH (ref 0.44–1.00)
GFR calc Af Amer: 41 mL/min — ABNORMAL LOW (ref >60)
GFR calc non Af Amer: 36 mL/min — ABNORMAL LOW (ref >60)
Glucose, Bld: 82 mg/dL (ref 70–99)
Potassium: 3.6 mmol/L (ref 3.5–5.1)
Sodium: 139 mmol/L (ref 135–145)

## 2020-07-13 LAB — CBC
HCT: 39 % (ref 36.0–46.0)
Hemoglobin: 11.9 g/dL — ABNORMAL LOW (ref 12.0–15.0)
MCH: 27.2 pg (ref 26.0–34.0)
MCHC: 30.5 g/dL (ref 30.0–36.0)
MCV: 89.2 fL (ref 80.0–100.0)
Platelets: 338 10*3/uL (ref 150–400)
RBC: 4.37 MIL/uL (ref 3.87–5.11)
RDW: 15.5 % (ref 11.5–15.5)
WBC: 8.7 10*3/uL (ref 4.0–10.5)
nRBC: 0 % (ref 0.0–0.2)

## 2020-07-13 LAB — BRAIN NATRIURETIC PEPTIDE: B Natriuretic Peptide: 209.8 pg/mL — ABNORMAL HIGH (ref 0.0–100.0)

## 2020-07-13 LAB — TROPONIN I (HIGH SENSITIVITY)
Troponin I (High Sensitivity): 8 ng/L (ref <18)
Troponin I (High Sensitivity): 8 ng/L (ref <18)

## 2020-07-13 NOTE — ED Notes (Signed)
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 Approx 0300hrs this am felt hot and having some SOB, having sinus congestion type symptoms, has continued, having some chest tightness in the middle of her chest and also describes as heaviness. Did have some SOB. Placed on cont cardiac monitor with cont pox monitoring and int NBP monitoring.

## 2020-07-13 NOTE — ED Notes (Signed)
Repeat Troponin obtained from [facility]

## 2020-07-13 NOTE — ED Triage Notes (Addendum)
Pt c/o CP x today-denies fever/flu sx-states she had recent CHF dx-NAD-steady gait

## 2020-07-13 NOTE — ED Notes (Signed)
AVS reviewed with pt, opportunity for questions provided. Encouraged pt to follow up with her cardiologist.

## 2020-07-13 NOTE — Telephone Encounter (Signed)
Called patient she reports that she has had chest tightness and shortness of breath since yesterday. The tightness she describes in her chest is a constricting feeling and sometimes she feels it in her neck as well. She doesn't not have any lower extremity swelling, she isn't sure if she has had weight gain but she thinks she has but can't give a solid number because she hasn't been monitoring it. I advised her to at least be seen by urgent care of [facility] emergency [facility] due to her complaint. She verbally understood. No further questions.

## 2020-07-13 NOTE — Telephone Encounter (Signed)
Pt c/o Shortness Of Breath: STAT if SOB developed within the last 24 hours or pt is noticeably SOB on the phone  1. Are you currently SOB (can you hear that pt is SOB on the phone)? Yes   2. How long have you been experiencing SOB? Since yesterday, 07/12/20 around 3:00 PM  3. Are you SOB when sitting or when up moving around? Both  4. Are you currently experiencing any other symptoms? Chest tightness (no pain)

## 2020-07-13 NOTE — ED Provider Notes (Signed)
[facility] EMERGENCY DEPARTMENT Provider Note   CSN: [id number] Arrival date & time: 07/13/20  1512     History Chief Complaint  Patient presents with  . Chest Pain    Karen Warner is a 69 y.o. female with a medical history including CAD, MI sp 3 stents in 2020, CHF with EF of 25-30% on last echo in July 2021 presents today with chest pressure and tightness that started last night. States she noticed pain starting around bedtime and described pressure in her id sternal area with band like tightening below the chest. States the pain comes and goes lasting about 10 minutes at a time. Around 3 am she she became short of breath and tried sitting up and breathing cool air which helped, however she continues to have SOB that worsens with exertion and laying down.   Recently evaluated at [facility] ED and admitted for CHF with echo showing EF of 25-20% change from previous echo in June 2020 with EF of 40-45%. Follow with Cardiology on 7/28 with Dr. Servando Salina and started on spironolactone and entresto in addition to carvedilol and dual antiplatelet therapy with plan for repeat echo in 30 days. She reports compliance with medications. She denies fever, chills, diaphoresis, cough, n/v/d, weakness, lightheadedness, and dysuria.   Past Medical History:  Diagnosis Date  . Arthralgia 01/14/2020  . Atherosclerotic heart disease of native coronary artery without angina pectoris 04/13/2014  . CHF (congestive heart failure) (HCC)   . Chronic bilateral low back pain with bilateral sciatica 01/13/2019  . Chronic bladder pain 11/08/2014   Last Assessment & Plan:  Formatting of this note might be different from the original. Patient has chronic pain syndrome in general I think this is unfortunately worsening her pelvic pain and bladder pain her examination today was very reassuring I am advised her to use the estrogen cream I did start her on Elavil 10 milligrams at that time if she does tolerated  will increase it to 25 milligrams.   . Chronic idiopathic constipation 12/22/2014   Formatting of this note might be different from the original. Last Assessment & Plan:  For better bowel emptying please use either Citrucel / Benefiber start with 2 tablespoons daily and titrate up or down to effect.  Also please use glycerin suppositories as needed to assist in evacuation. Last Assessment & Plan:  Formatting of this note might be different from the original. For better bowel empt  . Chronic obstructive pulmonary disease, unspecified (HCC) 03/18/2018  . Class 1 obesity due to excess calories with serious comorbidity and body mass index (BMI) of 31.0 to 31.9 in adult 06/26/2020  . Coronary artery disease   . DDD (degenerative disc disease), cervical 01/13/2019  . Depression 11/05/2019  . Diabetic polyneuropathy associated with type 2 diabetes mellitus (HCC) 02/24/2019  . Diverticulitis 05/20/2018  . Diverticulitis of colon 03/18/2018  . Family history of ischemic heart disease (IHD) 08/16/2013  . Fibromyalgia affecting multiple sites 01/14/2020  . Gastro-esophageal reflux disease without esophagitis 01/13/2019  . Glaucoma 11/08/2014  . History of anemia 12/12/2019  . Hordeolum externum of left upper eyelid 03/13/2020  . Hypertension 11/08/2014  . Iron deficiency anemia 01/13/2019  . Left renal mass 11/08/2019  . Leukocytosis 05/28/2018  . Low vitamin B12 level 03/13/2020  . Low vitamin D level 12/12/2019  . LV dysfunction 05/31/2019  . Malignant essential hypertension 01/22/2016  . Migraine, unspecified, not intractable, without status migrainosus 11/08/2014  . Mixed hyperlipidemia 01/13/2019  . Myocardial  infarction (HCC)   . Other premature beats 11/08/2014  . 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 PVD (peripheral vascular disease) (HCC) 04/08/2017  . 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 Trochanteric bursitis of right hip 04/20/2019  . Type 2 diabetes mellitus without  complications (HCC) 12/08/2013  . Vaginal atrophy 11/08/2014   Formatting of this note might be different from the original. Last Assessment & Plan:  For vaginal atrophy please place a pea size dab of Estrogen vaginal cream  into the vagina 3 times a week ( Monday, Wednesday, Friday) Last Assessment & Plan:  Formatting of this note might be different from the original. For vaginal atrophy please place a pea size dab of Estrogen vaginal cream  into the vagina     Patient Active Problem List   Diagnosis Date Noted  . Class 1 obesity due to excess calories with serious comorbidity and body mass index (BMI) of 31.0 to 31.9 in adult 06/26/2020  . Pustular rash 05/14/2020  . Torticollis, acute 04/11/2020  . Hordeolum externum of left upper eyelid 03/13/2020  . Low vitamin B12 level 03/13/2020  . Pain of right great toe 01/24/2020  . Arthralgia 01/14/2020  . Fibromyalgia affecting multiple sites 01/14/2020  . Diarrhea of presumed infectious origin 01/07/2020  . Ingrown nail 12/27/2019  . History of anemia 12/12/2019  . Low hemoglobin 12/12/2019  . Low vitamin D level 12/12/2019  . Long-term use of aspirin therapy 11/15/2019  . Left renal mass 11/08/2019  . Depression 11/05/2019  . Neuropathy 11/05/2019  . Other fatigue 09/12/2019  . Rhinosinusitis 09/12/2019  . Hypertensive emergency 08/06/2019  . LV dysfunction 05/31/2019  . Trochanteric bursitis of right hip 04/20/2019  . Aftercare following surgery 04/12/2019  . Injury of toenail of left foot 04/06/2019  . Moderate episode of recurrent major depressive disorder (HCC) 03/29/2019  . Diabetic polyneuropathy associated with type 2 diabetes mellitus (HCC) 02/24/2019  . History of disseminated herpes simplex 02/22/2019  . 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 Seasonal allergies 02/22/2019  . Encounter to establish care 01/23/2019  . Vitamin D deficiency  01/23/2019  . Chronic bilateral low back pain with bilateral sciatica 01/13/2019  . DDD (degenerative disc disease), cervical 01/13/2019  . Gastro-esophageal reflux disease without esophagitis 01/13/2019  . History of intestinal surgery 01/13/2019  . Iron deficiency anemia 01/13/2019  . Mixed hyperlipidemia 01/13/2019  . Tobacco abuse 01/13/2019  . Leukocytosis 05/28/2018  . Diverticulitis 05/20/2018  . Chronic obstructive pulmonary disease, unspecified (HCC) 03/18/2018  . Diverticulitis of colon 03/18/2018  . PVD (peripheral vascular disease) (HCC) 04/08/2017  . Chest pain 04/07/2017  . Malignant essential hypertension 01/22/2016  . Lower abdominal pain 12/22/2014  . Chronic idiopathic constipation 12/22/2014  . Chronic bladder pain 11/08/2014  . Glaucoma 11/08/2014  . H/O vaginal hysterectomy 11/08/2014  . Hypertension 11/08/2014  . Migraine, unspecified, not intractable, without status migrainosus 11/08/2014  . Nocturia 11/08/2014  . Other premature beats 11/08/2014  . Urge incontinence 11/08/2014  . Urinary urgency 11/08/2014  . Vaginal atrophy 11/08/2014  . Atherosclerotic heart disease of native coronary artery without angina pectoris 04/13/2014  . Type 2 diabetes mellitus without complications (HCC) 12/08/2013  . Family history of ischemic heart disease (IHD) 08/16/2013  . Ptosis 04/19/2012    Past Surgical History:  Procedure Laterality Date  . ABDOMINAL HYSTERECTOMY    . BLEPHAROPLASTY Bilateral   . COLON  SURGERY    . CORONARY ANGIOPLASTY WITH STENT PLACEMENT  2020   X3  . REFRACTIVE SURGERY Left    [place]     OB History   No obstetric history on file.     Family History  Problem Relation Age of Onset  . Lung cancer Mother   . Coronary artery disease Mother   . Diabetes Mother   . Glaucoma Mother   . Hypertension Mother   . Migraines Father   . Diabetes Maternal Grandmother   . Diabetes Daughter   . Migraines Son     Social History    Tobacco Use  . Smoking status: Former Games developer  . Smokeless tobacco: Never Used  Vaping Use  . Vaping Use: Never used  Substance Use Topics  . Alcohol use: Never  . Drug use: Never    Home Medications Prior to Admission medications   Medication Sig Start Date End Date Taking? Authorizing Provider  acetaminophen (TYLENOL) 325 MG tablet Take 2 tablets by mouth every 6 (six) hours as needed. 08/15/19   [provider]  ACETAMINOPHEN-BUTALBITAL 50-325 MG TABS Take 1 tablet by mouth every 4 (four) hours as needed.    [provider]  albuterol (PROVENTIL) (2.5 MG/3ML) 0.083% nebulizer solution INL CONTENTS OF 1 VIAL VIA NEBULIZER Q 4 H PRF WHEEZING OR COUGH 01/26/18   [provider]  albuterol (VENTOLIN HFA) 108 (90 Base) MCG/ACT inhaler Inhale 2 puffs into the lungs every 6 (six) hours as needed. 01/26/18   [provider]  aspirin 81 MG EC tablet 81 mg daily.    [provider]  baclofen (LIORESAL) 10 MG tablet Take 10 mg by mouth in the morning and at bedtime. 06/29/17   [provider]  budesonide (PULMICORT) 1 MG/2ML nebulizer solution Take 1 mg by nebulization in the morning and at bedtime.    [provider]  carvedilol (COREG) 6.25 MG tablet Take 6.25 mg by mouth 2 (two) times daily with a meal.    [provider]  dicyclomine (BENTYL) 10 MG capsule Take 10 mg by mouth every 6 (six) hours as needed for spasms.    [provider]  ergocalciferol (VITAMIN D2) 1.25 MG (50000 UT) capsule Take 50,000 Units by mouth once a week.    [provider]  esomeprazole (NEXIUM) 40 MG capsule Take 40 mg by mouth daily at 12 noon.    [provider]  famotidine (PEPCID) 40 MG tablet Take 40 mg by mouth at bedtime.    [provider]  ferrous sulfate 324 MG TBEC Take 324 mg by mouth daily with breakfast.    [provider]  folic acid (FOLVITE) 1 MG tablet Take 1 mg by mouth daily.     [provider]  furosemide (LASIX) 40 MG tablet Take 40 mg by mouth daily.    [provider]  insulin lispro (HUMALOG) 100 UNIT/ML KwikPen Inject 16 Units into the skin 3 (three) times daily. Administer 16 units three times daily before a meal per sliding scale 09/22/16   [provider]  magnesium oxide (MAG-OX) 400 MG tablet Take 400 mg by mouth 2 (two) times daily.    [provider]  nitroGLYCERIN (NITROSTAT) 0.4 MG SL tablet Place 0.4 mg under the tongue every 5 (five) minutes as needed for chest pain.    [provider]  pregabalin (LYRICA) 50 MG capsule Take 50 mg by mouth 3 (three) times daily.    [provider]  rosuvastatin (CRESTOR) 40 MG tablet Take 40 mg by mouth daily.    [provider]  sacubitril-valsartan (ENTRESTO) 24-26 MG Take 1 tablet by mouth 2 (two) times daily. 06/27/20   Tobb, Kardie, DO  spironolactone (ALDACTONE) 25 MG tablet Take 12.5 mg by mouth daily.    [provider]  SUMAtriptan (IMITREX) 50 MG tablet 50 mg as directed. 05/11/20   [provider]  ticagrelor (BRILINTA) 90 MG TABS tablet Take 90 mg by mouth in the morning and at bedtime. 08/15/19 08/14/20  [provider]  vitamin B-12 (CYANOCOBALAMIN) 1000 MCG tablet Take 1,000 mcg by mouth daily.    [provider]    Allergies    Bee venom, Amoxicillin-pot clavulanate, Buprenorphine hcl, Clarithromycin, Duloxetine hcl, Hydrocodone, Lactose, Liraglutide, Oxycodone, and Tramadol hcl  Review of Systems   Review of Systems  Constitutional: Negative for chills and fever.  HENT: Negative for congestion and sore throat.   Eyes: Negative for pain and redness.  Respiratory: Positive for chest tightness and shortness of breath. Negative for cough.   Cardiovascular: Positive for chest pain and palpitations. Negative for leg swelling.  Gastrointestinal: Negative for abdominal distention, abdominal pain, nausea and  vomiting.  Endocrine: Negative for polydipsia and polyphagia.  Genitourinary: Negative for dysuria and flank pain.  Musculoskeletal: Negative for gait problem and joint swelling.  Neurological: Negative for dizziness, weakness and numbness.    Physical Exam Updated Vital Signs BP 120/73 (BP Location: Right Arm)   Pulse 72   Temp 98.1 F (36.7 C) (Oral)   Resp 16   Ht 5\' 5"  (1.651 m)   Wt 81.6 kg   SpO2 100%   BMI 29.95 kg/m   Physical Exam Constitutional:      Appearance: She is well-developed.  HENT:     Head: Normocephalic and atraumatic.  Eyes:     Extraocular Movements: Extraocular movements intact.     Pupils: Pupils are equal, round, and reactive to light.  Cardiovascular:     Rate and Rhythm: Normal rate and regular rhythm.     Pulses:          Radial pulses are 2+ on the right side and 2+ on the left side.     Heart sounds: Normal heart sounds. No murmur heard.  No friction rub. No gallop.   Pulmonary:     Effort: Pulmonary effort is normal.     Breath sounds: Normal breath sounds.  Chest:     Chest wall: No mass, deformity or tenderness.  Abdominal:     General: Bowel sounds are normal.     Palpations: Abdomen is soft.  Musculoskeletal:        General: Normal range of motion.     Cervical back: Normal range of motion and neck supple.     Right lower leg: 1+ Pitting Edema present.     Left lower leg: 1+ Pitting Edema present.  Skin:    General: Skin is warm and dry.     Capillary Refill: Capillary refill takes less than 2 seconds.  Neurological:     General: No focal deficit present.     Mental Status: She is alert and oriented to person, place, and time.  Psychiatric:        Mood and Affect: Mood normal.        Behavior: Behavior normal.     ED Results / Procedures / Treatments   Labs (all labs ordered are listed, but only abnormal results  are displayed) Labs Reviewed  BASIC METABOLIC PANEL - Abnormal; Notable for the following components:       Result Value   Creatinine, Ser 1.47 (*)    GFR calc non Af Amer 36 (*)    GFR calc Af Amer 41 (*)    All other components within normal limits  CBC - Abnormal; Notable for the following components:   Hemoglobin 11.9 (*)    All other components within normal limits  BRAIN NATRIURETIC PEPTIDE - Abnormal; Notable for the following components:   B Natriuretic Peptide 209.8 (*)    All other components within normal limits  TROPONIN I (HIGH SENSITIVITY)  TROPONIN I (HIGH SENSITIVITY)    EKG EKG Interpretation  Date/Time:  Friday July 13 2020 15:15:20 EDT Ventricular Rate:  88 PR Interval:  146 QRS Duration: 146 QT Interval:  398 QTC Calculation: 481 R Axis:   42 Text Interpretation: Normal sinus rhythm Left bundle branch block Abnormal ECG No previous ECGs available Confirmed by Alvira Monday ([id number]) on 07/13/2020 7:35:45 PM   Radiology DG Chest 2 View  Result Date: 07/13/2020 CLINICAL DATA:  Chest pain EXAM: CHEST - 2 VIEW COMPARISON:  06/22/2020 chest radiograph and prior. FINDINGS: Mild cardiomegaly. Both lungs are clear. No pneumothorax or pleural effusion. No acute osseous abnormality. IMPRESSION: No focal airspace disease. Mild cardiomegaly, unchanged. Electronically Signed   By: Stana Bunting M.D.   On: 07/13/2020 15:56    Procedures Procedures (including critical care time)  Medications Ordered in ED Medications - No data to display  ED Course  I have reviewed the triage vital signs and the nursing notes.  Pertinent labs & imaging results that were available during my care of the patient were reviewed by me and considered in my medical decision making (see chart for details).  Clinical Course as of Jul 15 303  Fri Jul 13, 2020  1905 BP: 119/74 [JL]  1909 Creatinine(!): 1.47 [JL]  2056 Troponin I (High Sensitivity): 8 [JL]    Clinical Course User Index [JL] Quincy Simmonds, MD   MDM Rules/Calculators/A&P                          69 y/o female with  history of CAD, MI sp stents, CHF presents for 2 days of chest pain and dyspnea concerning for ACS vs CHF exacerbation.Recently admitted to  [facility] for CHF with BNP in 3000s and found to have EF of 20-25% on echo. She is HDS today. Creatinine increased to 1.47 compared to 1 on 06/22/2020, BNP now 209. EKG showing no acute change with tropnin negative x2, and CXR negative. Likely presenting with CHF exacerbation, given increase in Cr will not increase dieretics. Shared decision making with patient for admission for ischemic workup vs close follow up with cardiologist. Discharged home with instructions to follow up with cardiologist.  Final Clinical Impression(s) / ED Diagnoses Final diagnoses:  Nonspecific chest pain  Dyspnea, unspecified type    Rx / DC Orders ED Discharge Orders    None       Quincy Simmonds, MD 07/14/20 6045    Alvira Monday, MD 07/16/20 2345

## 2020-07-16 ENCOUNTER — Telehealth: Payer: Self-pay | Admitting: Cardiology

## 2020-07-16 NOTE — Telephone Encounter (Signed)
How do you advise?

## 2020-07-16 NOTE — Telephone Encounter (Signed)
Patient wanted to know if it will be safe for her to travel. The [facility] on Friday and the Doctors there advised her to contact Dr. Servando Salina to determine if it is safe to travel or not. The patient will be traveling by car and stopping intermittently between here and [place].  The patient would like to leave today if possible. Please advise

## 2020-07-16 NOTE — Telephone Encounter (Signed)
  Called and spoke with the pt regarding Dr. Mallory Shirk recommendations. Pt verbalized understanding and had no additional questions.

## 2020-07-16 NOTE — Telephone Encounter (Signed)
Please let the patient know that traveling is always a personal decision. She does have multiple cardiovascular condition that could be exacerbated during the course of traveling. If she chooses to travel she need to be aware of her leg edema, as well as signs of angina. She has nitroglycerin, if she gains more than 3 lbs in 3 days and 5 lbs in 1 week to take an extra Lasix. She  will also need to know the location of the nearest emergency department if she finds herself needing to be evaluated.

## 2020-07-26 ENCOUNTER — Telehealth: Payer: Self-pay | Admitting: Cardiology

## 2020-07-26 MED ORDER — TICAGRELOR 90 MG PO TABS: 90.0000 mg | ORAL_TABLET | Freq: Two times a day (BID) | ORAL | 11 refills | Status: DC

## 2020-07-26 NOTE — Telephone Encounter (Signed)
Patient states that her family member that lives with her tested positive for covid. She was tested negative. Does her appt on 08/01/20 with Dr. Servando Salina need to be cancelled? If so, can she do virtual? Please advise.

## 2020-07-26 NOTE — Telephone Encounter (Signed)
Spoke with patient just now and let her know that Dr. Servando Salina said that it would be best to make her appointment virtual. She verbalizes understanding and thanks me for the call back.

## 2020-08-01 ENCOUNTER — Telehealth: Payer: Medicare Other | Admitting: Cardiology

## 2020-08-01 ENCOUNTER — Telehealth: Payer: Self-pay

## 2020-08-01 ENCOUNTER — Encounter: Payer: Self-pay | Admitting: Cardiology

## 2020-08-01 VITALS — BP 115/76 | HR 83 | Ht 65.0 in | Wt 181.4 lb

## 2020-08-01 NOTE — Telephone Encounter (Signed)
  Patient Consent for Virtual Visit         Karen Warner has provided verbal consent on 08/01/2020 for a virtual visit (video or telephone).   CONSENT FOR VIRTUAL VISIT FOR:  Karen Warner  By participating in this virtual visit I agree to the following:  I hereby voluntarily request, consent and authorize [facility] and its employed or contracted physicians, physician assistants, nurse practitioners or other licensed health care professionals (the Practitioner), to provide me with telemedicine health care services (the "Services") as deemed necessary by the treating Practitioner. I acknowledge and consent to receive the Services by the Practitioner via telemedicine. I understand that the telemedicine visit will involve communicating with the Practitioner through live audiovisual communication technology and the disclosure of certain medical information by electronic transmission. I acknowledge that I have been given the opportunity to request an in-person assessment or other available alternative prior to the telemedicine visit and am voluntarily participating in the telemedicine visit.  I understand that I have the right to withhold or withdraw my consent to the use of telemedicine in the course of my care at any time, without affecting my right to future care or treatment, and that the Practitioner or I may terminate the telemedicine visit at any time. I understand that I have the right to inspect all information obtained and/or recorded in the course of the telemedicine visit and may receive copies of available information for a reasonable fee.  I understand that some of the potential risks of receiving the Services via telemedicine include:  Marland Kitchen Delay or interruption in medical evaluation due to technological equipment failure or disruption; . Information transmitted may not be sufficient (e.g. poor resolution of images) to allow for appropriate medical decision making  by the Practitioner; and/or  . In rare instances, security protocols could fail, causing a breach of personal health information.  Furthermore, I acknowledge that it is my responsibility to provide information about my medical history, conditions and care that is complete and accurate to the best of my ability. I acknowledge that Practitioner's advice, recommendations, and/or decision may be based on factors not within their control, such as incomplete or inaccurate data provided by me or distortions of diagnostic images or specimens that may result from electronic transmissions. I understand that the practice of medicine is not an exact science and that Practitioner makes no warranties or guarantees regarding treatment outcomes. I acknowledge that a copy of this consent can be made available to me via my patient portal [facility] MyChart), or I can request a printed copy by calling the office of [facility].    I understand that my insurance will be billed for this visit.   I have read or had this consent read to me. . I understand the contents of this consent, which adequately explains the benefits and risks of the Services being provided via telemedicine.  . I have been provided ample opportunity to ask questions regarding this consent and the Services and have had my questions answered to my satisfaction. . I give my informed consent for the services to be provided through the use of telemedicine in my medical care

## 2020-08-01 NOTE — H&P (View-Only) (Signed)
  Cartez Mogle, DO  08/01/2020 1:16 PM    [facility]

## 2020-08-01 NOTE — Progress Notes (Signed)
  Sabirin Baray, DO  08/01/2020 1:16 PM    [facility] 

## 2020-08-02 ENCOUNTER — Telehealth: Payer: Medicare Other | Admitting: Cardiology

## 2020-08-02 DIAGNOSIS — I255 Ischemic cardiomyopathy: Secondary | ICD-10-CM

## 2020-08-02 DIAGNOSIS — E785 Hyperlipidemia, unspecified: Secondary | ICD-10-CM

## 2020-08-02 DIAGNOSIS — I2510 Atherosclerotic heart disease of native coronary artery without angina pectoris: Secondary | ICD-10-CM

## 2020-08-02 DIAGNOSIS — E119 Type 2 diabetes mellitus without complications: Secondary | ICD-10-CM

## 2020-08-02 DIAGNOSIS — I10 Essential (primary) hypertension: Secondary | ICD-10-CM

## 2020-08-03 ENCOUNTER — Encounter (HOSPITAL_COMMUNITY)
Admission: RE | Disposition: A | Discharge status: Home / Self Care | Payer: Self-pay | Source: Home / Self Care | Attending: Cardiovascular Disease

## 2020-08-03 ENCOUNTER — Ambulatory Visit (HOSPITAL_COMMUNITY)
Admission: RE | Admit: 2020-08-03 | Discharge: 2020-08-03 | Disposition: A | Discharge status: Home / Self Care | Payer: Medicare Other | Attending: Cardiovascular Disease | Admitting: Cardiovascular Disease

## 2020-08-03 DIAGNOSIS — Z955 Presence of coronary angioplasty implant and graft: Secondary | ICD-10-CM

## 2020-08-03 DIAGNOSIS — Z888 Allergy status to other drugs, medicaments and biological substances status: Secondary | ICD-10-CM | POA: Insufficient documentation

## 2020-08-03 DIAGNOSIS — I200 Unstable angina: Secondary | ICD-10-CM | POA: Diagnosis present

## 2020-08-03 DIAGNOSIS — I10 Essential (primary) hypertension: Secondary | ICD-10-CM | POA: Diagnosis not present

## 2020-08-03 DIAGNOSIS — E782 Mixed hyperlipidemia: Secondary | ICD-10-CM | POA: Diagnosis present

## 2020-08-03 DIAGNOSIS — K219 Gastro-esophageal reflux disease without esophagitis: Secondary | ICD-10-CM | POA: Diagnosis present

## 2020-08-03 DIAGNOSIS — I214 Non-ST elevation (NSTEMI) myocardial infarction: Secondary | ICD-10-CM

## 2020-08-03 DIAGNOSIS — Z7982 Long term (current) use of aspirin: Secondary | ICD-10-CM | POA: Insufficient documentation

## 2020-08-03 DIAGNOSIS — I25110 Atherosclerotic heart disease of native coronary artery with unstable angina pectoris: Secondary | ICD-10-CM | POA: Diagnosis not present

## 2020-08-03 DIAGNOSIS — Z885 Allergy status to narcotic agent status: Secondary | ICD-10-CM | POA: Diagnosis not present

## 2020-08-03 DIAGNOSIS — F17200 Nicotine dependence, unspecified, uncomplicated: Secondary | ICD-10-CM | POA: Diagnosis not present

## 2020-08-03 DIAGNOSIS — Z7902 Long term (current) use of antithrombotics/antiplatelets: Secondary | ICD-10-CM | POA: Insufficient documentation

## 2020-08-03 DIAGNOSIS — Z95820 Peripheral vascular angioplasty status with implants and grafts: Secondary | ICD-10-CM

## 2020-08-03 DIAGNOSIS — I255 Ischemic cardiomyopathy: Secondary | ICD-10-CM | POA: Diagnosis not present

## 2020-08-03 DIAGNOSIS — Z6831 Body mass index (BMI) 31.0-31.9, adult: Secondary | ICD-10-CM | POA: Diagnosis not present

## 2020-08-03 DIAGNOSIS — Z7951 Long term (current) use of inhaled steroids: Secondary | ICD-10-CM | POA: Insufficient documentation

## 2020-08-03 DIAGNOSIS — E6609 Other obesity due to excess calories: Secondary | ICD-10-CM | POA: Insufficient documentation

## 2020-08-03 DIAGNOSIS — Z79899 Other long term (current) drug therapy: Secondary | ICD-10-CM | POA: Insufficient documentation

## 2020-08-03 DIAGNOSIS — Z720 Tobacco use: Secondary | ICD-10-CM | POA: Diagnosis present

## 2020-08-03 DIAGNOSIS — I2510 Atherosclerotic heart disease of native coronary artery without angina pectoris: Secondary | ICD-10-CM | POA: Diagnosis present

## 2020-08-03 DIAGNOSIS — Z880 Allergy status to penicillin: Secondary | ICD-10-CM | POA: Insufficient documentation

## 2020-08-03 DIAGNOSIS — I447 Left bundle-branch block, unspecified: Secondary | ICD-10-CM | POA: Insufficient documentation

## 2020-08-03 DIAGNOSIS — Z794 Long term (current) use of insulin: Secondary | ICD-10-CM | POA: Insufficient documentation

## 2020-08-03 DIAGNOSIS — E669 Obesity, unspecified: Secondary | ICD-10-CM

## 2020-08-03 HISTORY — PX: CORONARY STENT INTERVENTION: CATH118234

## 2020-08-03 HISTORY — PX: LEFT HEART CATH AND CORONARY ANGIOGRAPHY: CATH118249

## 2020-08-03 HISTORY — PX: CORONARY PRESSURE/FFR STUDY: CATH118243

## 2020-08-03 HISTORY — PX: INTRAVASCULAR PRESSURE WIRE/FFR STUDY: CATH118243

## 2020-08-03 LAB — POCT ACTIVATED CLOTTING TIME
Activated Clotting Time: 346 seconds
Activated Clotting Time: 268 seconds

## 2020-08-03 LAB — GLUCOSE, CAPILLARY: Glucose-Capillary: 92 mg/dL (ref 70–99)

## 2020-08-03 SURGERY — LEFT HEART CATH AND CORONARY ANGIOGRAPHY
Anesthesia: LOCAL

## 2020-08-03 MED ORDER — IOHEXOL 350 MG/ML SOLN
INTRAVENOUS | Status: DC | PRN
  Administered 2020-08-03: 150 mL

## 2020-08-03 MED ORDER — FENTANYL CITRATE (PF) 100 MCG/2ML IJ SOLN
INTRAMUSCULAR | Status: AC
  Filled 2020-08-03: qty 2

## 2020-08-03 MED ORDER — FENTANYL CITRATE (PF) 100 MCG/2ML IJ SOLN
INTRAMUSCULAR | Status: DC | PRN
  Administered 2020-08-03 (×3): 25 ug via INTRAVENOUS

## 2020-08-03 MED ORDER — SODIUM CHLORIDE 0.9 % WEIGHT BASED INFUSION: 1.0000 mL/kg/h | INTRAVENOUS | Status: DC

## 2020-08-03 MED ORDER — MIDAZOLAM HCL 2 MG/2ML IJ SOLN
INTRAMUSCULAR | Status: AC
  Filled 2020-08-03: qty 2

## 2020-08-03 MED ORDER — NITROGLYCERIN 1 MG/10 ML FOR IR/CATH LAB
INTRA_ARTERIAL | Status: DC | PRN
  Administered 2020-08-03: 200 ug
  Administered 2020-08-03: 100 ug

## 2020-08-03 MED ORDER — SODIUM CHLORIDE 0.9 % IV SOLN: 250.0000 mL | INTRAVENOUS | Status: DC | PRN

## 2020-08-03 MED ORDER — TICAGRELOR 90 MG PO TABS
ORAL_TABLET | ORAL | Status: AC
  Filled 2020-08-03: qty 1

## 2020-08-03 MED ORDER — VERAPAMIL HCL 2.5 MG/ML IV SOLN
INTRAVENOUS | Status: DC | PRN
  Administered 2020-08-03: 10 mL via INTRA_ARTERIAL

## 2020-08-03 MED ORDER — LIDOCAINE HCL (PF) 1 % IJ SOLN
INTRAMUSCULAR | Status: AC
  Filled 2020-08-03: qty 30

## 2020-08-03 MED ORDER — HEPARIN (PORCINE) IN NACL 1000-0.9 UT/500ML-% IV SOLN
INTRAVENOUS | Status: DC | PRN
  Administered 2020-08-03 (×2): 500 mL

## 2020-08-03 MED ORDER — HYDRALAZINE HCL 20 MG/ML IJ SOLN: 10.0000 mg | INTRAMUSCULAR | Status: DC | PRN

## 2020-08-03 MED ORDER — MIDAZOLAM HCL 2 MG/2ML IJ SOLN
INTRAMUSCULAR | Status: DC | PRN
  Administered 2020-08-03: 1 mg via INTRAVENOUS
  Administered 2020-08-03: 2 mg via INTRAVENOUS

## 2020-08-03 MED ORDER — LIDOCAINE HCL (PF) 1 % IJ SOLN
INTRAMUSCULAR | Status: DC | PRN
  Administered 2020-08-03: 2 mL

## 2020-08-03 MED ORDER — SODIUM CHLORIDE 0.9% FLUSH: 3.0000 mL | INTRAVENOUS | Status: DC | PRN

## 2020-08-03 MED ORDER — LABETALOL HCL 5 MG/ML IV SOLN: 10.0000 mg | INTRAVENOUS | Status: DC | PRN

## 2020-08-03 MED ORDER — VERAPAMIL HCL 2.5 MG/ML IV SOLN
INTRAVENOUS | Status: AC
  Filled 2020-08-03: qty 2

## 2020-08-03 MED ORDER — SODIUM CHLORIDE 0.9 % WEIGHT BASED INFUSION
3.0000 mL/kg/h | INTRAVENOUS | Status: AC
  Administered 2020-08-03: 3 mL/kg/h via INTRAVENOUS

## 2020-08-03 MED ORDER — ONDANSETRON HCL 4 MG/2ML IJ SOLN: 4.0000 mg | Freq: Four times a day (QID) | INTRAMUSCULAR | Status: DC | PRN

## 2020-08-03 MED ORDER — HEPARIN SODIUM (PORCINE) 1000 UNIT/ML IJ SOLN
INTRAMUSCULAR | Status: AC
  Filled 2020-08-03: qty 1

## 2020-08-03 MED ORDER — HEPARIN SODIUM (PORCINE) 1000 UNIT/ML IJ SOLN
INTRAMUSCULAR | Status: DC | PRN
  Administered 2020-08-03 (×2): 5000 [IU] via INTRAVENOUS

## 2020-08-03 MED ORDER — HEPARIN (PORCINE) IN NACL 1000-0.9 UT/500ML-% IV SOLN
INTRAVENOUS | Status: AC
  Filled 2020-08-03: qty 1000

## 2020-08-03 MED ORDER — TICAGRELOR 90 MG PO TABS
ORAL_TABLET | ORAL | Status: DC | PRN
  Administered 2020-08-03: 180 mg via ORAL

## 2020-08-03 MED ORDER — ACETAMINOPHEN 325 MG PO TABS
650.0000 mg | ORAL_TABLET | ORAL | Status: DC | PRN
  Administered 2020-08-03: 650 mg via ORAL
  Filled 2020-08-03: qty 2

## 2020-08-03 MED ORDER — SODIUM CHLORIDE 0.9% FLUSH: 3.0000 mL | Freq: Two times a day (BID) | INTRAVENOUS | Status: DC

## 2020-08-03 SURGICAL SUPPLY — 20 items
BALLN SAPPHIRE 2.5X12 (BALLOONS) ×2
BALLN SAPPHIRE ~~LOC~~ 3.75X18 (BALLOONS) ×2 IMPLANT
BALLOON SAPPHIRE 2.5X12 (BALLOONS) ×1 IMPLANT
CATH 5FR JL3.5 JR4 ANG PIG MP (CATHETERS) ×2 IMPLANT
CATH LAUNCHER 5F EBU3.0 (CATHETERS) ×1 IMPLANT
CATH LAUNCHER 5F EBU3.5 (CATHETERS) ×2 IMPLANT
CATHETER LAUNCHER 5F EBU3.0 (CATHETERS) ×2
DEVICE RAD COMP TR BAND LRG (VASCULAR PRODUCTS) ×2 IMPLANT
GLIDESHEATH SLEND SS 6F .021 (SHEATH) ×2 IMPLANT
GUIDEWIRE INQWIRE 1.5J.035X260 (WIRE) ×1 IMPLANT
GUIDEWIRE PRESSURE COMET II (WIRE) ×2 IMPLANT
INQWIRE 1.5J .035X260CM (WIRE) ×2
KIT ENCORE 26 ADVANTAGE (KITS) ×2 IMPLANT
KIT ESSENTIALS PG (KITS) ×2 IMPLANT
KIT HEART LEFT (KITS) ×2 IMPLANT
PACK CARDIAC CATHETERIZATION (CUSTOM PROCEDURE TRAY) ×2 IMPLANT
STENT RESOLUTE ONYX 3.0X8 (Permanent Stent) ×2 IMPLANT
STENT RESOLUTE ONYX 3.5X30 (Permanent Stent) ×2 IMPLANT
TRANSDUCER W/STOPCOCK (MISCELLANEOUS) ×2 IMPLANT
TUBING CIL FLEX 10 FLL-RA (TUBING) ×2 IMPLANT

## 2020-08-03 NOTE — Discharge Instructions (Signed)
Radial Site Care  This sheet gives you information about how to care for yourself after your procedure. Your health care provider may also give you more specific instructions. If you have problems or questions, contact your health care provider. What can I expect after the procedure? After the procedure, it is common to have:  Bruising and tenderness at the catheter insertion area. Follow these instructions at home: Medicines  Take over-the-counter and prescription medicines only as told by your health care provider. Insertion site care  Follow instructions from your health care provider about how to take care of your insertion site. Make sure you: ? Wash your hands with soap and water before you change your bandage (dressing). If soap and water are not available, use hand sanitizer. ? Change your dressing as told by your health care provider. ? Leave stitches (sutures), skin glue, or adhesive strips in place. These skin closures may need to stay in place for 2 weeks or longer. If adhesive strip edges start to loosen and curl up, you may trim the loose edges. Do not remove adhesive strips completely unless your health care provider tells you to do that.  Check your insertion site every day for signs of infection. Check for: ? Redness, swelling, or pain. ? Fluid or blood. ? Pus or a bad smell. ? Warmth.  Do not take baths, swim, or use a hot tub until your health care provider approves.  You may shower 24-48 hours after the procedure, or as directed by your health care provider. ? Remove the dressing and gently wash the site with plain soap and water. ? Pat the area dry with a clean towel. ? Do not rub the site. That could cause bleeding.  Do not apply powder or lotion to the site. Activity   For 24 hours after the procedure, or as directed by your health care provider: ? Do not flex or bend the affected arm. ? Do not push or pull heavy objects with the affected arm. ? Do not  drive yourself home from the hospital or clinic. You may drive 24 hours after the procedure unless your health care provider tells you not to. ? Do not operate machinery or power tools.  Do not lift anything that is heavier than 10 lb (4.5 kg), or the limit that you are told, until your health care provider says that it is safe.  Ask your health care provider when it is okay to: ? Return to work or school. ? Resume usual physical activities or sports. ? Resume sexual activity. General instructions  If the catheter site starts to bleed, raise your arm and put firm pressure on the site. If the bleeding does not stop, get help right away. This is a medical emergency.  If you went home on the same day as your procedure, a responsible adult should be with you for the first 24 hours after you arrive home.  Keep all follow-up visits as told by your health care provider. This is important. Contact a health care provider if:  You have a fever.  You have redness, swelling, or yellow drainage around your insertion site. Get help right away if:  You have unusual pain at the radial site.  The catheter insertion area swells very fast.  The insertion area is bleeding, and the bleeding does not stop when you hold steady pressure on the area.  Your arm or hand becomes pale, cool, tingly, or numb. These symptoms may represent a serious problem   that is an emergency. Do not wait to see if the symptoms will go away. Get medical help right away. Call your local emergency services (911 in the [place] Do not drive yourself to the hospital. Summary  After the procedure, it is common to have bruising and tenderness at the site.  Follow instructions from your health care provider about how to take care of your radial site wound. Check the wound every day for signs of infection.  Do not lift anything that is heavier than 10 lb (4.5 kg), or the limit that you are told, until your health care provider says  that it is safe. This information is not intended to replace advice given to you by your health care provider. Make sure you discuss any questions you have with your health care provider. Document Revised: 12/23/2017 Document Reviewed: 12/23/2017 Elsevier Patient Education  2020 [employer]  

## 2020-08-03 NOTE — Interval H&P Note (Signed)
History and Physical Interval Note:  08/03/2020 12:57 PM  Karen Warner  has presented today for surgery, with the diagnosis of non-stemi.  The various methods of treatment have been discussed with the patient and family. After consideration of risks, benefits and other options for treatment, the patient has consented to  Procedure(s): LEFT HEART CATH AND CORONARY ANGIOGRAPHY (N/A) as a surgical intervention.  The patient's history has been reviewed, patient examined, no change in status, stable for surgery.  I have reviewed the patient's chart and labs.  Questions were answered to the patient's satisfaction.    Paper records reviewed from [facility].  I personally reviewed the patient's H&P, cardiology consultation, and discharge summary.  She is admitted with chest pain and minimal troponin elevation.  The patient has known coronary artery disease with 3 coronary stents placed about 1 year ago at [facility]  Plan was initially for stress testing, but the patient's echocardiogram revealed severe LV dysfunction with an LVEF of 20 to 25%.  This was reduced from her baseline LVEF which is known to be 40 to 45%.  Because of severe segmental LV dysfunction consistent with ischemic heart disease, she is referred for cardiac catheterization to evaluate for obstructive coronary disease.  I personally reviewed her labs which show mild troponin elevation in a flat range and are otherwise unremarkable.  Her EKG shows left bundle branch block which is chronic.  Further plans/disposition pending her cardiac catheterization results.  She is also consented for the possibility of PCI.  She remains on dual antiplatelet therapy with aspirin and ticagrelor.  Tonny Bollman

## 2020-08-03 NOTE — Progress Notes (Signed)
Ward Givens, NP in to see client and ok to d/c home

## 2020-08-03 NOTE — Progress Notes (Signed)
Discussed stent, restrictions, diet, exercise, Brilinta, NTG and CRPII. Pt receptive but very sleepy. Needs reiteration, left materials. She is very serious about her Brilinta. She has tried to do CRPII but had orthopedic issues that hindered. She then tried PT recently. She has a pool that she can walk in. Encouraged her to do so once wrist is healed. Will refer to [place] CRPII. She has quit smoking. [id number] Ethelda Chick CES, ACSM 2:59 PM 08/03/2020

## 2020-08-03 NOTE — Discharge Summary (Signed)
Discharge Summary for Same Day PCI   Patient ID: Lynnlee Revels MRN: [id number]; DOB: 07-20-1951  Admit date: 08/03/2020 Discharge date: 08/03/2020  Primary Care Provider: Laurel Dimmer, FNP  Primary Cardiologist: Thomasene Ripple, DO  Primary Electrophysiologist:  None   Discharge Diagnoses    Principal Problem:   Non-ST elevation (NSTEMI) myocardial infarction [facility]) Active Problems:   Atherosclerotic heart disease of native coronary artery without angina pectoris   Class 1 obesity due to excess calories with serious comorbidity and body mass index (BMI) of 31.0 to 31.9 in adult   Gastro-esophageal reflux disease without esophagitis   Hypertension   Mixed hyperlipidemia   Tobacco abuse   Unstable angina (HCC)   Ischemic cardiomyopathy   Diagnostic Studies/Procedures    Cardiac Catheterization 08/03/2020:   Non-stenotic Mid LAD lesion.   1.  Severe single-vessel coronary artery disease with hemodynamically significant severe stenosis of the proximal LAD, treated successfully with PCI using overlapping drug-eluting stents guided by pressure wire analysis 2.  Continued patency of the distal stented segment in the LAD, but evidence of a possible chronic dissection at the distal stent edge into a small caliber apical LAD 3.  Nonobstructive left circumflex stenosis (dominant circumflex) 4.  Small, nondominant RCA with no significant stenosis 5.  Normal LVEDP  Continue aspirin and ticagrelor x12 months.  Consider discharge later today if no early complications arise.  Continue medical therapy for ischemic cardiomyopathy and coronary artery disease.  Coronary Diagrams  Diagnostic Dominance: Left  Intervention    _____________   History of Present Illness     Patrcia Schnepp is a 69 y.o. female with pmh of CAD, LHC in September 2020 with DES to LAD, echo in January 2020 with EF 40-45% but a more recent echo showing EF 20-25%, ischemic  cardiomyopathy, HTN, HLD, LBBB, smoker who was admitted for same day PCI. The patient presented to the ER at [facility] for chest pain and seen by cardiology. She was scheduled to have a virtual appointment with Dr. Servando Salina 08/01/20 but she no-showed. The day before she presented to the ED the patient took medications for her migraine headache. After this she developed retrosternal chest pain described as a pressure. It was persistent. She that she was having indigestion so she took a gas-ex with no relief. The pain was 8/10 and radiated into the back and shoulders. She took SL nitro x 1 and went to the ED. In the ED EKG had no acute changes. First troponin was negative. She was given aspirin and Lovenox injection. The second troponin was 0.05 but the third came back at 0.26. The patient se stopped smoking. The patient was transferred to [facility] for a cardiac catheterization.   Cardiac catheterization was arranged for further evaluation.  Hospital Course     The patient underwent cardiac cath as noted above which showed severe single vessel CAD of the prox LAD treated with PCI/DES. Also showed nonobstructive disease of the left circumflex and small nondominant RCA with no significant stenosis, normal LVEDP. Plan for DAPT with ASA/Brilinta for at least 12 months. Patient was previously taking this for previous stenting in September 2020, will continue for another year. The patient was seen by cardiac rehab while in short stay. There were no observed complications post cath. Radial cath site was re-evaluated prior to discharge and found to be stable without any complications. Instructions/precautions regarding cath site care were given prior to discharge. Follow up with our office has been  arranged. Medications are listed below.  _____________  Cath/PCI Registry Performance & Quality Measures: 1. Aspirin prescribed? - Yes 2. ADP Receptor Inhibitor (Plavix/Clopidogrel, Brilinta/Ticagrelor or  Effient/Prasugrel) prescribed (includes medically managed patients)? - Yes 3. High Intensity Statin (Lipitor 40-80mg  or Crestor 20-40mg ) prescribed? - Yes 4. For EF <40%, was ACEI/ARB prescribed? - Yes 5. For EF <40%, Aldosterone Antagonist (Spironolactone or Eplerenone) prescribed? - Yes 6. Cardiac Rehab Phase II ordered (Included Medically managed Patients)? - Yes  _____________   Discharge Vitals Blood pressure 120/66, pulse 80, resp. rate 15, SpO2 100 %.  There were no vitals filed for this visit.  Last Labs & Radiologic Studies     High Sensitivity Troponin:   Recent Labs  Lab 07/13/20 1600 07/13/20 2027  TROPONINIHS 8 8    ____________  DG Chest 2 View  Result Date: 07/13/2020 CLINICAL DATA:  Chest pain EXAM: CHEST - 2 VIEW COMPARISON:  06/22/2020 chest radiograph and prior. FINDINGS: Mild cardiomegaly. Both lungs are clear. No pneumothorax or pleural effusion. No acute osseous abnormality. IMPRESSION: No focal airspace disease. Mild cardiomegaly, unchanged. Electronically Signed   By: Stana Bunting M.D.   On: 07/13/2020 15:56   Disposition   Pt is being discharged home today in good condition.  Follow-up Plans & Appointments     Follow-up Information    Tobb, Lavona Mound, DO Follow up on 08/09/2020.   Specialty: Cardiology Why: @ 1:20PM Contact information: [address] [place] [address] [phone]              Discharge Instructions    Amb Referral to Cardiac Rehabilitation   Complete by: As directed    To [place]   Diagnosis:  Coronary Stents PTCA     After initial evaluation and assessments completed: Virtual Based Care may be provided alone or in conjunction with Phase 2 Cardiac Rehab based on patient barriers.: Yes       Discharge Medications   Allergies as of 08/03/2020      Reactions   Bee Venom Anaphylaxis   Amoxicillin-pot Clavulanate Diarrhea, Nausea And Vomiting, Other (See Comments)   Other reaction(s): GI Intolerance    Buprenorphine Hcl    Other reaction(s): Itching   Clarithromycin    Abdominal pain and diarrhea   Duloxetine Hcl    drowsiness   Hydrocodone    Other reaction(s): Itching   Lactose    Liraglutide    Abdominal discomfort   Oxycodone Itching   Other reaction(s): Other (see comments) "Funny feeling in head"  "off the edge"   Tramadol Hcl    Other reaction(s): Itching      Medication List    TAKE these medications   acetaminophen 325 MG tablet Commonly known as: TYLENOL Take 2 tablets by mouth every 6 (six) hours as needed.   ACETAMINOPHEN-BUTALBITAL 50-325 MG Tabs Take 1 tablet by mouth every 4 (four) hours as needed.   albuterol 108 (90 Base) MCG/ACT inhaler Commonly known as: VENTOLIN HFA Inhale 2 puffs into the lungs every 6 (six) hours as needed.   albuterol (2.5 MG/3ML) 0.083% nebulizer solution Commonly known as: PROVENTIL INL CONTENTS OF 1 VIAL VIA NEBULIZER Q 4 H PRF WHEEZING OR COUGH   aspirin 81 MG EC tablet 81 mg daily.   baclofen 10 MG tablet Commonly known as: LIORESAL Take 10 mg by mouth daily.   budesonide 1 MG/2ML nebulizer solution Commonly known as: PULMICORT Take 1 mg by nebulization in the morning and at bedtime.   carvedilol 6.25 MG  tablet Commonly known as: COREG Take 6.25 mg by mouth 2 (two) times daily with a meal.   diclofenac Sodium 1 % Gel Commonly known as: VOLTAREN Apply 2 g topically 2 (two) times daily as needed.   dicyclomine 10 MG capsule Commonly known as: BENTYL Take 10 mg by mouth every 6 (six) hours as needed for spasms.   Entresto 24-26 MG Generic drug: sacubitril-valsartan Take 1 tablet by mouth 2 (two) times daily.   ergocalciferol 1.25 MG (50000 UT) capsule Commonly known as: VITAMIN D2 Take 50,000 Units by mouth once a week.   esomeprazole 40 MG capsule Commonly known as: NEXIUM Take 40 mg by mouth daily at 12 noon.   famotidine 40 MG tablet Commonly known as: PEPCID Take 40 mg by mouth at bedtime.     ferrous sulfate 324 MG Tbec Take 324 mg by mouth daily with breakfast.   folic acid 1 MG tablet Commonly known as: FOLVITE Take 1 mg by mouth daily.   furosemide 40 MG tablet Commonly known as: LASIX Take 40 mg by mouth daily.   insulin lispro 100 UNIT/ML KwikPen Commonly known as: HUMALOG Inject 16 Units into the skin 3 (three) times daily. Administer 16 units three times daily before a meal per sliding scale   MAGnesium-Oxide 400 (241.3 Mg) MG tablet Generic drug: magnesium oxide Take 400 mg by mouth daily.   nitroGLYCERIN 0.4 MG SL tablet Commonly known as: NITROSTAT Place 0.4 mg under the tongue every 5 (five) minutes as needed for chest pain.   pregabalin 50 MG capsule Commonly known as: LYRICA Take 50 mg by mouth 3 (three) times daily.   rosuvastatin 40 MG tablet Commonly known as: CRESTOR Take 40 mg by mouth daily.   spironolactone 25 MG tablet Commonly known as: ALDACTONE Take 12.5 mg by mouth daily.   SUMAtriptan 50 MG tablet Commonly known as: IMITREX 50 mg as directed.   ticagrelor 90 MG Tabs tablet Commonly known as: BRILINTA Take 1 tablet (90 mg total) by mouth in the morning and at bedtime.   Evaristo Bury FlexTouch 200 UNIT/ML FlexTouch Pen Generic drug: insulin degludec Inject 40 Units into the skin 2 (two) times daily.   vitamin B-12 1000 MCG tablet Commonly known as: CYANOCOBALAMIN Take 1,000 mcg by mouth daily.          Allergies Allergies  Allergen Reactions  . Bee Venom Anaphylaxis  . Amoxicillin-Pot Clavulanate Diarrhea, Nausea And Vomiting and Other (See Comments)    Other reaction(s): GI Intolerance  . Buprenorphine Hcl     Other reaction(s): Itching  . Clarithromycin     Abdominal pain and diarrhea  . Duloxetine Hcl     drowsiness  . Hydrocodone     Other reaction(s): Itching  . Lactose   . Liraglutide     Abdominal discomfort  . Oxycodone Itching    Other reaction(s): Other (see comments) "Funny feeling in head"  "off  the edge"   . Tramadol Hcl     Other reaction(s): Itching    Outstanding Labs/Studies   None  Duration of Discharge Encounter   Greater than 30 minutes including physician time.  Signed, Nicolasa Ducking, NP 08/03/2020, 7:26 PM

## 2020-08-07 ENCOUNTER — Encounter (HOSPITAL_COMMUNITY): Payer: Self-pay | Admitting: Cardiovascular Disease

## 2020-08-07 ENCOUNTER — Other Ambulatory Visit: Payer: Self-pay | Admitting: Cardiology

## 2020-08-07 MED ORDER — CARVEDILOL 6.25 MG PO TABS
6.2500 mg | ORAL_TABLET | Freq: Two times a day (BID) | ORAL | 3 refills | Status: DC
Start: 2020-08-07 — End: 2020-12-25

## 2020-08-07 MED ORDER — NITROGLYCERIN 0.4 MG SL SUBL: 0.4000 mg | SUBLINGUAL_TABLET | SUBLINGUAL | 3 refills | Status: DC | PRN

## 2020-08-07 NOTE — Telephone Encounter (Signed)
Refills sent in per request 

## 2020-08-07 NOTE — Telephone Encounter (Signed)
*  STAT* If patient is at the pharmacy, call can be transferred to refill team.   1. Which medications need to be refilled? (please list name of each medication and dose if known)  carvedilol (COREG) 6.25 MG tablet nitroGLYCERIN (NITROSTAT) 0.4 MG SL tablet  2. Which pharmacy/location (including street and city if local pharmacy) is medication to be sent to? [facility] [phone] - [place], [place] - [address]  3. Do they need a 30 day or 90 day supply? 30 day  Patient is out of medication.

## 2020-08-09 ENCOUNTER — Ambulatory Visit (INDEPENDENT_AMBULATORY_CARE_PROVIDER_SITE_OTHER): Payer: Medicare Other | Admitting: Cardiology

## 2020-08-09 ENCOUNTER — Other Ambulatory Visit: Payer: Self-pay

## 2020-08-09 ENCOUNTER — Encounter: Payer: Self-pay | Admitting: Cardiology

## 2020-08-09 VITALS — BP 102/58 | HR 90 | Ht 65.0 in | Wt 182.8 lb

## 2020-08-09 DIAGNOSIS — I2510 Atherosclerotic heart disease of native coronary artery without angina pectoris: Secondary | ICD-10-CM

## 2020-08-09 DIAGNOSIS — Z720 Tobacco use: Secondary | ICD-10-CM | POA: Diagnosis not present

## 2020-08-09 DIAGNOSIS — I10 Essential (primary) hypertension: Secondary | ICD-10-CM

## 2020-08-09 DIAGNOSIS — I255 Ischemic cardiomyopathy: Secondary | ICD-10-CM | POA: Diagnosis not present

## 2020-08-09 NOTE — Progress Notes (Signed)
Cardiology Office Note:    Date:  08/09/2020   ID:  Karen Warner, DOB May 08, 1951, MRN [id number]  PCP:  Laurel Dimmer, FNP  Cardiologist:  Thomasene Ripple, DO  Electrophysiologist:  None   Referring MD: Selena Batten *   " I am doing well"  History of Present Illness:    Karen Warner is a 69 y.o. female with a hx of coronary artery disease with recent stent to the LAD, ischemic cardiomyopathy and recent EF 20 to 25%, hypertension, hyperlipidemia smoker.  The patient was recently admitted at [facility] for chest pain and transferred to [facility] due to elevation in troponin.  [place] her troponin was 0.05 which peaked up to 0.26.  At [facility] she underwent left heart catheterization which showed significant LAD gnosis was treated with a drug-eluting stent.  She was discharged on good medical therapy and discharged home.  Today she tells me that since her hospitalization she has been improving but is still really tired.  She denies any chest pain.  Past Medical History:  Diagnosis Date  . Arthralgia 01/14/2020  . Atherosclerotic heart disease of native coronary artery without angina pectoris 04/13/2014  . CHF (congestive heart failure) (HCC)   . Chronic bilateral low back pain with bilateral sciatica 01/13/2019  . Chronic bladder pain 11/08/2014   Last Assessment & Plan:  Formatting of this note might be different from the original. Patient has chronic pain syndrome in general I think this is unfortunately worsening her pelvic pain and bladder pain her examination today was very reassuring I am advised her to use the estrogen cream I did start her on Elavil 10 milligrams at that time if she does tolerated will increase it to 25 milligrams.   . Chronic idiopathic constipation 12/22/2014   Formatting of this note might be different from the original. Last Assessment & Plan:  For better bowel emptying please use either Citrucel  / Benefiber start with 2 tablespoons daily and titrate up or down to effect.  Also please use glycerin suppositories as needed to assist in evacuation. Last Assessment & Plan:  Formatting of this note might be different from the original. For better bowel empt  . Chronic obstructive pulmonary disease, unspecified (HCC) 03/18/2018  . Class 1 obesity due to excess calories with serious comorbidity and body mass index (BMI) of 31.0 to 31.9 in adult 06/26/2020  . Coronary artery disease   . DDD (degenerative disc disease), cervical 01/13/2019  . Depression 11/05/2019  . Diabetic polyneuropathy associated with type 2 diabetes mellitus (HCC) 02/24/2019  . Diverticulitis 05/20/2018  . Diverticulitis of colon 03/18/2018  . Family history of ischemic heart disease (IHD) 08/16/2013  . Fibromyalgia affecting multiple sites 01/14/2020  . Gastro-esophageal reflux disease without esophagitis 01/13/2019  . Glaucoma 11/08/2014  . History of anemia 12/12/2019  . Hordeolum externum of left upper eyelid 03/13/2020  . Hypertension 11/08/2014  . Iron deficiency anemia 01/13/2019  . Left renal mass 11/08/2019  . Leukocytosis 05/28/2018  . Low vitamin B12 level 03/13/2020  . Low vitamin D level 12/12/2019  . LV dysfunction 05/31/2019  . Malignant essential hypertension 01/22/2016  . Migraine, unspecified, not intractable, without status migrainosus 11/08/2014  . Mixed hyperlipidemia 01/13/2019  . Myocardial infarction (HCC)   . Other premature beats 11/08/2014  . 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 PVD (peripheral vascular disease) (HCC) 04/08/2017  . 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  Trochanteric bursitis of right hip 04/20/2019  . Type 2 diabetes mellitus without complications (HCC) 12/08/2013  . Vaginal atrophy 11/08/2014   Formatting of this note might be different from the original. Last Assessment & Plan:  For vaginal atrophy please place a pea size dab of Estrogen vaginal cream   into the vagina 3 times a week ( Monday, Wednesday, Friday) Last Assessment & Plan:  Formatting of this note might be different from the original. For vaginal atrophy please place a pea size dab of Estrogen vaginal cream  into the vagina     Past Surgical History:  Procedure Laterality Date  . ABDOMINAL HYSTERECTOMY    . BLEPHAROPLASTY Bilateral   . COLON SURGERY    . CORONARY ANGIOPLASTY WITH STENT PLACEMENT  2020   X3  . CORONARY STENT INTERVENTION N/A 08/03/2020   Procedure: CORONARY STENT INTERVENTION;  Surgeon: Tonny Bollman, MD;  Location: [facility] INVASIVE CV LAB;  Service: Cardiovascular;  Laterality: N/A;  . INTRAVASCULAR PRESSURE WIRE/FFR STUDY N/A 08/03/2020   Procedure: INTRAVASCULAR PRESSURE WIRE/FFR STUDY;  Surgeon: Tonny Bollman, MD;  Location: [facility] INVASIVE CV LAB;  Service: Cardiovascular;  Laterality: N/A;  . LEFT HEART CATH AND CORONARY ANGIOGRAPHY N/A 08/03/2020   Procedure: LEFT HEART CATH AND CORONARY ANGIOGRAPHY;  Surgeon: Tonny Bollman, MD;  Location: [facility] INVASIVE CV LAB;  Service: Cardiovascular;  Laterality: N/A;  . REFRACTIVE SURGERY Left    [place]    Current Medications: Current Meds  Medication Sig  . acetaminophen (TYLENOL) 325 MG tablet Take 2 tablets by mouth every 6 (six) hours as needed.  . ACETAMINOPHEN-BUTALBITAL 50-325 MG TABS Take 1 tablet by mouth every 4 (four) hours as needed.  Marland Kitchen albuterol (PROVENTIL) (2.5 MG/3ML) 0.083% nebulizer solution INL CONTENTS OF 1 VIAL VIA NEBULIZER Q 4 H PRF WHEEZING OR COUGH  . albuterol (VENTOLIN HFA) 108 (90 Base) MCG/ACT inhaler Inhale 2 puffs into the lungs every 6 (six) hours as needed.  Marland Kitchen aspirin 81 MG EC tablet 81 mg daily.  . baclofen (LIORESAL) 10 MG tablet Take 10 mg by mouth daily.   . budesonide (PULMICORT) 1 MG/2ML nebulizer solution Take 1 mg by nebulization in the morning and at bedtime.  . carvedilol (COREG) 6.25 MG tablet Take 1 tablet (6.25 mg total) by mouth 2 (two) times daily with a meal.  .  diclofenac Sodium (VOLTAREN) 1 % GEL Apply 2 g topically 2 (two) times daily as needed.  . dicyclomine (BENTYL) 10 MG capsule Take 10 mg by mouth every 6 (six) hours as needed for spasms.  Marland Kitchen esomeprazole (NEXIUM) 40 MG capsule Take 40 mg by mouth daily at 12 noon.  . famotidine (PEPCID) 40 MG tablet Take 40 mg by mouth at bedtime.  . ferrous sulfate 324 MG TBEC Take 324 mg by mouth daily with breakfast.  . folic acid (FOLVITE) 1 MG tablet Take 1 mg by mouth daily.  . furosemide (LASIX) 40 MG tablet Take 40 mg by mouth daily.  . insulin lispro (HUMALOG) 100 UNIT/ML KwikPen Inject 16 Units into the skin 3 (three) times daily. Administer 16 units three times daily before a meal per sliding scale  . MAGNESIUM-OXIDE 400 (241.3 Mg) MG tablet Take 400 mg by mouth daily.  . nitroGLYCERIN (NITROSTAT) 0.4 MG SL tablet Place 1 tablet (0.4 mg total) under the tongue every 5 (five) minutes as needed for chest pain.  . pregabalin (LYRICA) 50 MG capsule Take 50 mg by mouth 3 (three) times daily.  . rosuvastatin (CRESTOR)  40 MG tablet Take 40 mg by mouth daily.  . sacubitril-valsartan (ENTRESTO) 24-26 MG Take 1 tablet by mouth 2 (two) times daily.  Marland Kitchen spironolactone (ALDACTONE) 25 MG tablet Take 12.5 mg by mouth daily.  . ticagrelor (BRILINTA) 90 MG TABS tablet Take 1 tablet (90 mg total) by mouth in the morning and at bedtime.  Evaristo Bury FLEXTOUCH 200 UNIT/ML FlexTouch Pen Inject 40 Units into the skin 2 (two) times daily.  . vitamin B-12 (CYANOCOBALAMIN) 1000 MCG tablet Take 1,000 mcg by mouth daily.     Allergies:   Bee venom, Amoxicillin-pot clavulanate, Buprenorphine hcl, Clarithromycin, Duloxetine hcl, Hydrocodone, Lactose, Liraglutide, Oxycodone, and Tramadol hcl   Social History   Socioeconomic History  . Marital status: Married    Spouse name: Not on file  . Number of children: Not on file  . Years of education: Not on file  . Highest education level: Not on file  Occupational History  . Not on  file  Tobacco Use  . Smoking status: Former Games developer  . Smokeless tobacco: Never Used  Vaping Use  . Vaping Use: Never used  Substance and Sexual Activity  . Alcohol use: Never  . Drug use: Never  . Sexual activity: Not on file  Other Topics Concern  . Not on file  Social History Narrative  . Not on file   Social Determinants of Health   Financial Resource Strain:   . Difficulty of Paying Living Expenses: Not on file  Food Insecurity:   . Worried About Programme researcher, broadcasting/film/video in the Last Year: Not on file  . Ran Out of Food in the Last Year: Not on file  Transportation Needs:   . Lack of Transportation (Medical): Not on file  . Lack of Transportation (Non-Medical): Not on file  Physical Activity:   . Days of Exercise per Week: Not on file  . Minutes of Exercise per Session: Not on file  Stress:   . Feeling of Stress : Not on file  Social Connections:   . Frequency of Communication with Friends and Family: Not on file  . Frequency of Social Gatherings with Friends and Family: Not on file  . Attends Religious Services: Not on file  . Active Member of Clubs or Organizations: Not on file  . Attends Banker Meetings: Not on file  . Marital Status: Not on file     Family History: The patient's family history includes Coronary artery disease in her mother; Diabetes in her daughter, maternal grandmother, and mother; Glaucoma in her mother; Hypertension in her mother; Lung cancer in her mother; Migraines in her father and son.  ROS:   Review of Systems  Constitution: Negative for decreased appetite, fever and weight gain.  HENT: Negative for congestion, ear discharge, hoarse voice and sore throat.   Eyes: Negative for discharge, redness, vision loss in right eye and visual halos.  Cardiovascular: Negative for chest pain, dyspnea on exertion, leg swelling, orthopnea and palpitations.  Respiratory: Negative for cough, hemoptysis, shortness of breath and snoring.     Endocrine: Negative for heat intolerance and polyphagia.  Hematologic/Lymphatic: Negative for bleeding problem. Does not bruise/bleed easily.  Skin: Negative for flushing, nail changes, rash and suspicious lesions.  Musculoskeletal: Negative for arthritis, joint pain, muscle cramps, myalgias, neck pain and stiffness.  Gastrointestinal: Negative for abdominal pain, bowel incontinence, diarrhea and excessive appetite.  Genitourinary: Negative for decreased libido, genital sores and incomplete emptying.  Neurological: Negative for brief paralysis, focal weakness, headaches  and loss of balance.  Psychiatric/Behavioral: Negative for altered mental status, depression and suicidal ideas.  Allergic/Immunologic: Negative for HIV exposure and persistent infections.    EKGs/Labs/Other Studies Reviewed:    The following studies were reviewed today:   EKG: None today   LHC   Non-stenotic Mid LAD lesion.  1.  Severe single-vessel coronary artery disease with hemodynamically significant severe stenosis of the proximal LAD, treated successfully with PCI using overlapping drug-eluting stents guided by pressure wire analysis 2.  Continued patency of the distal stented segment in the LAD, but evidence of a possible chronic dissection at the distal stent edge into a small caliber apical LAD 3.  Nonobstructive left circumflex stenosis (dominant circumflex) 4.  Small, nondominant RCA with no significant stenosis 5.  Normal LVEDP  Continue aspirin and ticagrelor x12 months.  Consider discharge later today if no early complications arise.  Continue medical therapy for ischemic cardiomyopathy and coronary artery disease.   Recent Labs: 07/13/2020: B Natriuretic Peptide 209.8; BUN 19; Creatinine, Ser 1.47; Hemoglobin 11.9; Platelets 338; Potassium 3.6; Sodium 139  Recent Lipid Panel No results found for: CHOL, TRIG, HDL, CHOLHDL, VLDL, LDLCALC, LDLDIRECT  Physical Exam:    VS:  BP (!) 102/58   Pulse  90   Ht 5\' 5"  (1.651 m)   Wt 182 lb 12.8 oz (82.9 kg)   SpO2 98%   BMI 30.42 kg/m     Wt Readings from Last 3 Encounters:  08/09/20 182 lb 12.8 oz (82.9 kg)  08/01/20 181 lb 6.4 oz (82.3 kg)  07/13/20 180 lb (81.6 kg)     GEN: Well nourished, well developed in no acute distress HEENT: Normal NECK: No JVD; No carotid bruits LYMPHATICS: No lymphadenopathy CARDIAC: S1S2 noted,RRR, no murmurs, rubs, gallops RESPIRATORY:  Clear to auscultation without rales, wheezing or rhonchi  ABDOMEN: Soft, non-tender, non-distended, +bowel sounds, no guarding. EXTREMITIES: No edema, No cyanosis, no clubbing MUSCULOSKELETAL:  No deformity  SKIN: Warm and dry NEUROLOGIC:  Alert and oriented x 3, non-focal PSYCHIATRIC:  Normal affect, good insight  ASSESSMENT:    1. Coronary artery disease involving native coronary artery of native heart without angina pectoris   2. Ischemic cardiomyopathy   3. Essential hypertension   4. Tobacco use    PLAN:    She appears to be still a bit fatigued but notes that she is getting by.  We again talked about cardiac rehab with the patient prefer aqua therapy.  She had discussed with [facility] who only does aerobics which I encouraged her to do for now until we can find a location for good aqua therapies.  I explained to her that the goal is to keep her active and to help with the deconditioning.  Coronary artery disease-recent stent in the LAD.  Continue patient on the aspirin and Brilinta.  She will remain on her rosuvastatin 40 mg daily.  Ischemic cardiomyopathy she is on Entresto 24-26 twice daily, Aldactone 12.5 mg daily, carvedilol 6.25 mg twice a day.  Her blood pressure cannot tolerate an up titration of any of any medication at this time.  I am hoping that once where her blood pressure can tolerate it I am going to start increasing her medication to target goals.  She also is a great candidate for SGL 2 inhibitor but for now we have to watch her blood pressure for  starting this medication.  Diabetes which is managed by her PCP blood sugar she tells me is not well controlled I discussed  with the patient that she needs to talk with her PCP for probably optimizing her insulin regimen.  She reports that she stopped smoking therefore continued smoking cessation advised.  The patient understands the need to lose weight with diet and exercise. We have discussed specific strategies for this.  I plan to repeat an echocardiogram in about 3 months.  The patient is in agreement with the above plan. The patient left the office in stable condition.  The patient will follow up in 4 weeks or sooner at which time I am hoping that I am able to continue to optimize her medication for ischemic cardiomyopathy.   Medication Adjustments/Labs and Tests Ordered: Current medicines are reviewed at length with the patient today.  Concerns regarding medicines are outlined above.  No orders of the defined types were placed in this encounter.  No orders of the defined types were placed in this encounter.   Patient Instructions  Medication Instructions:  Your physician recommends that you continue on your current medications as directed. Please refer to the Current Medication list given to you today.  *If you need a refill on your cardiac medications before your next appointment, please call your pharmacy*   Lab Work: None If you have labs (blood work) drawn today and your tests are completely normal, you will receive your results only by: Marland Kitchen MyChart Message (if you have MyChart) OR . A paper copy in the mail If you have any lab test that is abnormal or we need to change your treatment, we will call you to review the results.   Testing/Procedures: None   Follow-Up: At [facility], you and your health needs are our priority.  As part of our continuing mission to provide you with exceptional heart care, we have created designated Provider Care Teams.  These Care Teams  include your primary Cardiologist (physician) and Advanced Practice Providers (APPs -  Physician Assistants and Nurse Practitioners) who all work together to provide you with the care you need, when you need it.  We recommend signing up for the patient portal called "MyChart".  Sign up information is provided on this After Visit Summary.  MyChart is used to connect with patients for Virtual Visits (Telemedicine).  Patients are able to view lab/test results, encounter notes, upcoming appointments, etc.  Non-urgent messages can be sent to your provider as well.   To learn more about what you can do with MyChart, go to [email or web address].    Your next appointment:   4 week(s)  The format for your next appointment:   In Person  Provider:   Thomasene Ripple, DO   Other Instructions      Adopting a Healthy Lifestyle.  Know what a healthy weight is for you (roughly BMI <25) and aim to maintain this   Aim for 7+ servings of fruits and vegetables daily   65-80+ fluid ounces of water or unsweet tea for healthy kidneys   Limit to max 1 drink of alcohol per day; avoid smoking/tobacco   Limit animal fats in diet for cholesterol and heart health - choose grass fed whenever available   Avoid highly processed foods, and foods high in saturated/trans fats   Aim for low stress - take time to unwind and care for your mental health   Aim for 150 min of moderate intensity exercise weekly for heart health, and weights twice weekly for bone health   Aim for 7-9 hours of sleep daily   When it comes to diets, agreement  about the perfect plan isnt easy to find, even among the experts. Experts at the [facility] [employer] developed an idea known as the Healthy Eating Plate. Just imagine a plate divided into logical, healthy portions.   The emphasis is on diet quality:   Load up on vegetables and fruits - one-half of your plate: Aim for color and variety, and remember that potatoes dont  count.   Go for whole grains - one-quarter of your plate: Whole wheat, barley, wheat berries, quinoa, oats, brown rice, and foods made with them. If you want pasta, go with whole wheat pasta.   Protein power - one-quarter of your plate: Fish, chicken, beans, and nuts are all healthy, versatile protein sources. Limit red meat.   The diet, however, does go beyond the plate, offering a few other suggestions.   Use healthy plant oils, such as olive, canola, soy, corn, sunflower and peanut. Check the labels, and avoid partially hydrogenated oil, which have unhealthy trans fats.   If youre thirsty, drink water. Coffee and tea are good in moderation, but skip sugary drinks and limit milk and dairy products to one or two daily servings.   The type of carbohydrate in the diet is more important than the amount. Some sources of carbohydrates, such as vegetables, fruits, whole grains, and beans-are healthier than others.   Finally, stay active  Signed, Thomasene Ripple, DO  08/09/2020 3:47 PM    [facility]

## 2020-08-09 NOTE — Patient Instructions (Signed)
Medication Instructions:  °Your physician recommends that you continue on your current medications as directed. Please refer to the Current Medication list given to you today.  °*If you need a refill on your cardiac medications before your next appointment, please call your pharmacy* ° ° °Lab Work: °None °If you have labs (blood work) drawn today and your tests are completely normal, you will receive your results only by: °MyChart Message (if you have MyChart) OR °A paper copy in the mail °If you have any lab test that is abnormal or we need to change your treatment, we will call you to review the results. ° ° °Testing/Procedures: °None ° ° °Follow-Up: °At [facility], you and your health needs are our priority.  As part of our continuing mission to provide you with exceptional heart care, we have created designated Provider Care Teams.  These Care Teams include your primary Cardiologist (physician) and Advanced Practice Providers (APPs -  Physician Assistants and Nurse Practitioners) who all work together to provide you with the care you need, when you need it. ° °We recommend signing up for the patient portal called "MyChart".  Sign up information is provided on this After Visit Summary.  MyChart is used to connect with patients for Virtual Visits (Telemedicine).  Patients are able to view lab/test results, encounter notes, upcoming appointments, etc.  Non-urgent messages can be sent to your provider as well.   °To learn more about what you can do with MyChart, go to [email or web address].   ° °Your next appointment:   °4 week(s) ° °The format for your next appointment:   °In Person ° °Provider:   °Kardie Tobb, DO   ° ° °Other Instructions °  °

## 2020-08-10 ENCOUNTER — Telehealth (HOSPITAL_COMMUNITY): Payer: Self-pay

## 2020-08-10 NOTE — Telephone Encounter (Signed)
Faxed cardiac rehab referral to [place] [facility] phase II. 

## 2020-09-04 ENCOUNTER — Other Ambulatory Visit: Payer: Self-pay | Admitting: Cardiology

## 2020-09-04 MED ORDER — SPIRONOLACTONE 25 MG PO TABS
12.5000 mg | ORAL_TABLET | Freq: Every day | ORAL | 1 refills | Status: DC
Start: 2020-09-04 — End: 2021-01-01

## 2020-09-04 MED ORDER — TICAGRELOR 90 MG PO TABS: 90.0000 mg | ORAL_TABLET | Freq: Two times a day (BID) | ORAL | 1 refills | Status: DC

## 2020-09-04 NOTE — Telephone Encounter (Signed)
*  STAT* If patient is at the pharmacy, call can be transferred to refill team.   1. Which medications need to be refilled? (please list name of each medication and dose if known)  ticagrelor (BRILINTA) 90 MG TABS tablet 1 tablet 2x daily  spironolactone (ALDACTONE) 25 MG tablet 0.5 tablet once daily  2. Which pharmacy/location (including street and city if local pharmacy) is medication to be sent to? [facility] [phone] - [place], [place] - [address]  3. Do they need a 30 day or 90 day supply? 30 with refills   Patient tried to call the pharmacy to get refills but the pharmacy needs authorization from the doctor.  The patient is out of medication

## 2020-09-04 NOTE — Telephone Encounter (Signed)
Medications filled.  

## 2020-09-07 ENCOUNTER — Ambulatory Visit: Payer: Medicare Other | Admitting: Cardiology

## 2020-09-12 ENCOUNTER — Other Ambulatory Visit: Payer: Self-pay

## 2020-09-12 DIAGNOSIS — I2510 Atherosclerotic heart disease of native coronary artery without angina pectoris: Secondary | ICD-10-CM | POA: Insufficient documentation

## 2020-09-12 DIAGNOSIS — I509 Heart failure, unspecified: Secondary | ICD-10-CM | POA: Insufficient documentation

## 2020-09-12 DIAGNOSIS — I219 Acute myocardial infarction, unspecified: Secondary | ICD-10-CM | POA: Insufficient documentation

## 2020-09-13 ENCOUNTER — Other Ambulatory Visit: Payer: Self-pay

## 2020-09-13 ENCOUNTER — Ambulatory Visit (INDEPENDENT_AMBULATORY_CARE_PROVIDER_SITE_OTHER): Payer: Medicare Other | Admitting: Cardiology

## 2020-09-13 ENCOUNTER — Encounter: Payer: Self-pay | Admitting: Cardiology

## 2020-09-13 VITALS — BP 110/80 | HR 82 | Ht 65.0 in | Wt 185.0 lb

## 2020-09-13 DIAGNOSIS — I10 Essential (primary) hypertension: Secondary | ICD-10-CM

## 2020-09-13 DIAGNOSIS — E1142 Type 2 diabetes mellitus with diabetic polyneuropathy: Secondary | ICD-10-CM | POA: Diagnosis not present

## 2020-09-13 DIAGNOSIS — Z720 Tobacco use: Secondary | ICD-10-CM

## 2020-09-13 DIAGNOSIS — I2510 Atherosclerotic heart disease of native coronary artery without angina pectoris: Secondary | ICD-10-CM

## 2020-09-13 DIAGNOSIS — I519 Heart disease, unspecified: Secondary | ICD-10-CM | POA: Diagnosis not present

## 2020-09-13 DIAGNOSIS — I255 Ischemic cardiomyopathy: Secondary | ICD-10-CM

## 2020-09-13 NOTE — Progress Notes (Signed)
Cardiology Office Note:    Date:  09/13/2020   ID:  Karen Warner, DOB 04-Apr-1951, MRN [id number]  PCP:  Laurel Dimmer, FNP  Cardiologist:  Thomasene Ripple, DO  Electrophysiologist:  None   Referring MD: Selena Batten *  I am doing okay   History of Present Illness:     Britteney Warner is a 69 y.o. female with a hx of coronary artery disease with recent stent to the LAD in September 2021, ischemic cardiomyopathy and recent EF 20 to 25%, hypertension, hyperlipidemia, current smoker.    I did see the patient August 09, 2020 at that time her blood pressure was  Low therefore no significant medication changes were made due to concern for potential hypertensive response.   We discussed rehab and she was planning on considering cardiac rehab or aquatics.  She is undergoing a lot of stress with her estranged husband who lives in [place].  But she has been able to take care of herself medically and find some means to exercise.  Past Medical History:  Diagnosis Date   Arthralgia 01/14/2020   Atherosclerotic heart disease of native coronary artery without angina pectoris 04/13/2014   CHF (congestive heart failure) (HCC)    Chronic bilateral low back pain with bilateral sciatica 01/13/2019   Chronic bladder pain 11/08/2014   Last Assessment & Plan:  Formatting of this note might be different from the original. Patient has chronic pain syndrome in general I think this is unfortunately worsening her pelvic pain and bladder pain her examination today was very reassuring I am advised her to use the estrogen cream I did start her on Elavil 10 milligrams at that time if she does tolerated will increase it to 25 milligrams.    Chronic idiopathic constipation 12/22/2014   Formatting of this note might be different from the original. Last Assessment & Plan:  For better bowel emptying please use either Citrucel / Benefiber start with 2 tablespoons daily and  titrate up or down to effect.  Also please use glycerin suppositories as needed to assist in evacuation. Last Assessment & Plan:  Formatting of this note might be different from the original. For better bowel empt   Chronic obstructive pulmonary disease, unspecified (HCC) 03/18/2018   Class 1 obesity due to excess calories with serious comorbidity and body mass index (BMI) of 31.0 to 31.9 in adult 06/26/2020   Coronary artery disease    DDD (degenerative disc disease), cervical 01/13/2019   Depression 11/05/2019   Diabetic polyneuropathy associated with type 2 diabetes mellitus (HCC) 02/24/2019   Diverticulitis 05/20/2018   Diverticulitis of colon 03/18/2018   Family history of ischemic heart disease (IHD) 08/16/2013   Fibromyalgia affecting multiple sites 01/14/2020   Gastro-esophageal reflux disease without esophagitis 01/13/2019   Glaucoma 11/08/2014   History of anemia 12/12/2019   Hordeolum externum of left upper eyelid 03/13/2020   Hypertension 11/08/2014   Iron deficiency anemia 01/13/2019   Left renal mass 11/08/2019   Leukocytosis 05/28/2018   Low vitamin B12 level 03/13/2020   Low vitamin D level 12/12/2019   LV dysfunction 05/31/2019   Malignant essential hypertension 01/22/2016   Migraine, unspecified, not intractable, without status migrainosus 11/08/2014   Mixed hyperlipidemia 01/13/2019   Myocardial infarction (HCC)    Other premature beats 11/08/2014   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 Last Assessment & Plan:  For vaginal atrophy please place a pea size dab of Estrogen vaginal cream  into the vagina 3 times a week ( Monday,  Wednesday, Friday) Last Assessment & Plan:  Formatting of this note might be different from the original. For vaginal atrophy please place a pea size dab of Estrogen vaginal cream  into the vagina     Past Surgical History:  Procedure Laterality Date   ABDOMINAL HYSTERECTOMY     BLEPHAROPLASTY Bilateral    COLON SURGERY     CORONARY ANGIOPLASTY WITH STENT PLACEMENT  2020   X3   CORONARY STENT INTERVENTION N/A 08/03/2020   Procedure: CORONARY STENT INTERVENTION;  Surgeon: Tonny Bollman, MD;  Location: [facility] INVASIVE CV LAB;  Service: Cardiovascular;  Laterality: N/A;   INTRAVASCULAR PRESSURE WIRE/FFR STUDY N/A 08/03/2020   Procedure: INTRAVASCULAR PRESSURE WIRE/FFR STUDY;  Surgeon: Tonny Bollman, MD;  Location: [facility] INVASIVE CV LAB;  Service: Cardiovascular;  Laterality: N/A;   LEFT HEART CATH AND CORONARY ANGIOGRAPHY N/A 08/03/2020   Procedure: LEFT HEART CATH AND CORONARY ANGIOGRAPHY;  Surgeon: Tonny Bollman, MD;  Location: [facility] INVASIVE CV LAB;  Service: Cardiovascular;  Laterality: N/A;   REFRACTIVE SURGERY Left    [place]    Current Medications: Current Meds  Medication Sig   acetaminophen (TYLENOL) 325 MG tablet Take 2 tablets by mouth every 6 (six) hours as needed.   ACETAMINOPHEN-BUTALBITAL 50-325 MG TABS Take 1 tablet by mouth every 4 (four) hours as needed.   albuterol (PROVENTIL) (2.5 MG/3ML) 0.083% nebulizer solution INL CONTENTS OF 1 VIAL VIA NEBULIZER Q 4 H PRF WHEEZING OR COUGH   albuterol (VENTOLIN HFA) 108 (90 Base) MCG/ACT inhaler Inhale 2 puffs into the lungs every 6 (six) hours as needed.   aspirin 81 MG EC tablet 81 mg daily.   baclofen (LIORESAL) 10 MG tablet Take 10 mg by mouth daily.    budesonide (PULMICORT) 1 MG/2ML nebulizer solution Take 1 mg by nebulization in the morning and at bedtime.   carvedilol (COREG) 6.25 MG tablet Take 1 tablet (6.25 mg total) by mouth 2 (two) times daily with a meal.   diclofenac Sodium (VOLTAREN) 1 % GEL Apply 2 g  topically 2 (two) times daily as needed.   dicyclomine (BENTYL) 10 MG capsule Take 10 mg by mouth every 6 (six) hours as needed for spasms.   esomeprazole (NEXIUM) 40 MG capsule Take 40 mg by mouth daily at 12 noon.   famotidine (PEPCID) 40 MG tablet Take 40 mg by mouth at bedtime.   ferrous sulfate 324 MG TBEC Take 324 mg by mouth daily with breakfast.   folic acid (FOLVITE) 1 MG tablet Take 1 mg by mouth daily.   furosemide (LASIX) 40 MG tablet Take 40 mg by mouth daily.   insulin lispro (HUMALOG) 100 UNIT/ML KwikPen Inject 16 Units into the skin 3 (three) times daily. Administer 16 units three times daily before a meal per sliding scale   MAGNESIUM-OXIDE 400 (241.3 Mg) MG tablet Take 400 mg by mouth daily.   nitroGLYCERIN (NITROSTAT) 0.4 MG SL tablet Place 1 tablet (0.4 mg total) under the tongue every 5 (five) minutes as needed for chest pain.   predniSONE (DELTASONE) 5 MG tablet Take by mouth.   pregabalin (LYRICA) 50 MG capsule Take 50 mg by mouth  3 (three) times daily.   rosuvastatin (CRESTOR) 40 MG tablet Take 40 mg by mouth daily.   sacubitril-valsartan (ENTRESTO) 24-26 MG Take 1 tablet by mouth 2 (two) times daily.   spironolactone (ALDACTONE) 25 MG tablet Take 0.5 tablets (12.5 mg total) by mouth daily.   ticagrelor (BRILINTA) 90 MG TABS tablet Take 1 tablet (90 mg total) by mouth in the morning and at bedtime.   TRESIBA FLEXTOUCH 200 UNIT/ML FlexTouch Pen Inject 40 Units into the skin 2 (two) times daily.   TRINTELLIX 5 MG TABS tablet Take 5 mg by mouth daily.   vitamin B-12 (CYANOCOBALAMIN) 1000 MCG tablet Take 1,000 mcg by mouth daily.     Allergies:   Bee venom, Amoxicillin-pot clavulanate, Buprenorphine hcl, Clarithromycin, Duloxetine hcl, Hydrocodone, Lactose, Liraglutide, Oxycodone, and Tramadol hcl   Social History   Socioeconomic History   Marital status: Married    Spouse name: Not on file   Number of children: Not on file   Years of education:  Not on file   Highest education level: Not on file  Occupational History   Not on file  Tobacco Use   Smoking status: Former Smoker   Smokeless tobacco: Never Used  Building services engineer Use: Never used  Substance and Sexual Activity   Alcohol use: Never   Drug use: Never   Sexual activity: Not on file  Other Topics Concern   Not on file  Social History Narrative   Not on file   Social Determinants of Health   Financial Resource Strain:    Difficulty of Paying Living Expenses: Not on file  Food Insecurity:    Worried About Programme researcher, broadcasting/film/video in the Last Year: Not on file   [employer] of Food in the Last Year: Not on file  Transportation Needs:    Lack of Transportation (Medical): Not on file   Lack of Transportation (Non-Medical): Not on file  Physical Activity:    Days of Exercise per Week: Not on file   Minutes of Exercise per Session: Not on file  Stress:    Feeling of Stress : Not on file  Social Connections:    Frequency of Communication with Friends and Family: Not on file   Frequency of Social Gatherings with Friends and Family: Not on file   Attends Religious Services: Not on file   Active Member of Clubs or Organizations: Not on file   Attends Banker Meetings: Not on file   Marital Status: Not on file     Family History: The patient's family history includes Coronary artery disease in her mother; Diabetes in her daughter, maternal grandmother, and mother; Glaucoma in her mother; Hypertension in her mother; Lung cancer in her mother; Migraines in her father and son.  ROS:   Review of Systems  Constitution: Negative for decreased appetite, fever and weight gain.  HENT: Negative for congestion, ear discharge, hoarse voice and sore throat.   Eyes: Negative for discharge, redness, vision loss in right eye and visual halos.  Cardiovascular: Negative for chest pain, dyspnea on exertion, leg swelling, orthopnea and palpitations.    Respiratory: Negative for cough, hemoptysis, shortness of breath and snoring.   Endocrine: Negative for heat intolerance and polyphagia.  Hematologic/Lymphatic: Negative for bleeding problem. Does not bruise/bleed easily.  Skin: Negative for flushing, nail changes, rash and suspicious lesions.  Musculoskeletal: Negative for arthritis, joint pain, muscle cramps, myalgias, neck pain and stiffness.  Gastrointestinal: Negative for abdominal pain, bowel incontinence,  diarrhea and excessive appetite.  Genitourinary: Negative for decreased libido, genital sores and incomplete emptying.  Neurological: Negative for brief paralysis, focal weakness, headaches and loss of balance.  Psychiatric/Behavioral: Negative for altered mental status, depression and suicidal ideas.  Allergic/Immunologic: Negative for HIV exposure and persistent infections.    EKGs/Labs/Other Studies Reviewed:    The following studies were reviewed today:   EKG:  The ekg ordered today demonstrates   LHC   Non-stenotic Mid LAD lesion. 1. Severe single-vessel coronary artery disease with hemodynamically significant severe stenosis of the proximal LAD, treated successfully with PCI using overlapping drug-eluting stents guided by pressure wire analysis 2. Continued patency of the distal stented segment in the LAD, but evidence of a possible chronic dissection at the distal stent edge into a small caliber apical LAD 3. Nonobstructive left circumflex stenosis (dominant circumflex) 4. Small, nondominant RCA with no significant stenosis 5. Normal LVEDP  Continue aspirin and ticagrelor x12 months. Consider discharge later today if no early complications arise. Continue medical therapy for ischemic cardiomyopathy and coronary artery disease.   Recent Labs: 07/13/2020: B Natriuretic Peptide 209.8; BUN 19; Creatinine, Ser 1.47; Hemoglobin 11.9; Platelets 338; Potassium 3.6; Sodium 139  Recent Lipid Panel No results found  for: CHOL, TRIG, HDL, CHOLHDL, VLDL, LDLCALC, LDLDIRECT  Physical Exam:    VS:  BP 110/80 (BP Location: Right Arm, Patient Position: Sitting, Cuff Size: Normal)    Pulse 82    Ht 5\' 5"  (1.651 m)    Wt 185 lb (83.9 kg)    SpO2 97%    BMI 30.79 kg/m     Wt Readings from Last 3 Encounters:  09/13/20 185 lb (83.9 kg)  08/09/20 182 lb 12.8 oz (82.9 kg)  08/01/20 181 lb 6.4 oz (82.3 kg)     GEN: Well nourished, well developed in no acute distress HEENT: Normal NECK: No JVD; No carotid bruits LYMPHATICS: No lymphadenopathy CARDIAC: S1S2 noted,RRR, no murmurs, rubs, gallops RESPIRATORY:  Clear to auscultation without rales, wheezing or rhonchi  ABDOMEN: Soft, non-tender, non-distended, +bowel sounds, no guarding. EXTREMITIES: No edema, No cyanosis, no clubbing MUSCULOSKELETAL:  No deformity  SKIN: Warm and dry NEUROLOGIC:  Alert and oriented x 3, non-focal PSYCHIATRIC:  Normal affect, good insight  ASSESSMENT:    1. Atherosclerosis of native coronary artery of native heart without angina pectoris   2. Coronary artery disease involving native coronary artery of native heart without angina pectoris   3. Diabetic polyneuropathy associated with type 2 diabetes mellitus (HCC)   4. Tobacco abuse   5. Left ventricular dysfunction   6. Primary hypertension   7. Ischemic cardiomyopathy    PLAN:     She has no anginal symptoms, but tells me that few weeks ago she was going to significant stress and she felt a bit of chest pressure.  I have advised the patient if she feels that weight again next time to try nitroglycerin and bring this up to my attention.  Continue patient on her current dual antiplatelet therapy with her Crestor.  In terms of her cardiomyopathy continue her Entresto 24-26 mg twice daily, spironolactone 12.5 mg daily, carvedilol 6.25 mg twice daily.  She takes Lasix 40 mg daily.  I advised the patient if her weight goes up 3 pounds 2 days 5 pounds 1 week to take an extra  Lasix dose in the evening.  Should be a great candidate for 10/01/20 however I am good will forward my note to the patient PCP who manages her  diabetes medications before I start her on this medicine.  I am hoping she can review my note and give some approval for this.  The patient understands the need to lose weight with diet and exercise. We have discussed specific strategies for this.  Blood work will be done today to assess kidney function electrolytes.  The patient is in agreement with the above plan. The patient left the office in stable condition.  The patient will follow up in 8 weeks or sooner if needed.   Medication Adjustments/Labs and Tests Ordered: Current medicines are reviewed at length with the patient today.  Concerns regarding medicines are outlined above.  Orders Placed This Encounter  Procedures   Basic metabolic panel   Magnesium   No orders of the defined types were placed in this encounter.   Patient Instructions  Medication Instructions:    If you gain 3lbs over 2 days or 5lbs in 1 week take a extra 40 mg of lasix in the evening.   *If you need a refill on your cardiac medications before your next appointment, please call your pharmacy*   Lab Work: None/  If you have labs (blood work) drawn today and your tests are completely normal, you will receive your results only by:  MyChart Message (if you have MyChart) OR  A paper copy in the mail If you have any lab test that is abnormal or we need to change your treatment, we will call you to review the results.   Testing/Procedures: None.   Follow-Up: At [facility], you and your health needs are our priority.  As part of our continuing mission to provide you with exceptional heart care, we have created designated Provider Care Teams.  These Care Teams include your primary Cardiologist (physician) and Advanced Practice Providers (APPs -  Physician Assistants and Nurse Practitioners) who all work  together to provide you with the care you need, when you need it.  We recommend signing up for the patient portal called "MyChart".  Sign up information is provided on this After Visit Summary.  MyChart is used to connect with patients for Virtual Visits (Telemedicine).  Patients are able to view lab/test results, encounter notes, upcoming appointments, etc.  Non-urgent messages can be sent to your provider as well.   To learn more about what you can do with MyChart, go to [email or web address].    Your next appointment:   8 week(s)  The format for your next appointment:   In Person  Provider:   Thomasene Ripple, DO   Other Instructions      Adopting a Healthy Lifestyle.  Know what a healthy weight is for you (roughly BMI <25) and aim to maintain this   Aim for 7+ servings of fruits and vegetables daily   65-80+ fluid ounces of water or unsweet tea for healthy kidneys   Limit to max 1 drink of alcohol per day; avoid smoking/tobacco   Limit animal fats in diet for cholesterol and heart health - choose grass fed whenever available   Avoid highly processed foods, and foods high in saturated/trans fats   Aim for low stress - take time to unwind and care for your mental health   Aim for 150 min of moderate intensity exercise weekly for heart health, and weights twice weekly for bone health   Aim for 7-9 hours of sleep daily   When it comes to diets, agreement about the perfect plan isnt easy to find, even among the experts. Experts at  the [employer] developed an idea known as the Healthy Eating Plate. Just imagine a plate divided into logical, healthy portions.   The emphasis is on diet quality:   Load up on vegetables and fruits - one-half of your plate: Aim for color and variety, and remember that potatoes dont count.   Go for whole grains - one-quarter of your plate: Whole wheat, barley, wheat berries, quinoa, oats, brown rice, and foods made with  them. If you want pasta, go with whole wheat pasta.   Protein power - one-quarter of your plate: Fish, chicken, beans, and nuts are all healthy, versatile protein sources. Limit red meat.   The diet, however, does go beyond the plate, offering a few other suggestions.   Use healthy plant oils, such as olive, canola, soy, corn, sunflower and peanut. Check the labels, and avoid partially hydrogenated oil, which have unhealthy trans fats.   If youre thirsty, drink water. Coffee and tea are good in moderation, but skip sugary drinks and limit milk and dairy products to one or two daily servings.   The type of carbohydrate in the diet is more important than the amount. Some sources of carbohydrates, such as vegetables, fruits, whole grains, and beans-are healthier than others.   Finally, stay active  Signed, Thomasene Ripple, DO  09/13/2020 2:25 PM    [facility]

## 2020-09-13 NOTE — Patient Instructions (Signed)
Medication Instructions:    If you gain 3lbs over 2 days or 5lbs in 1 week take a extra 40 mg of lasix in the evening.   *If you need a refill on your cardiac medications before your next appointment, please call your pharmacy*   Lab Work: None/  If you have labs (blood work) drawn today and your tests are completely normal, you will receive your results only by: Marland Kitchen MyChart Message (if you have MyChart) OR . A paper copy in the mail If you have any lab test that is abnormal or we need to change your treatment, we will call you to review the results.   Testing/Procedures: None.   Follow-Up: At [facility], you and your health needs are our priority.  As part of our continuing mission to provide you with exceptional heart care, we have created designated Provider Care Teams.  These Care Teams include your primary Cardiologist (physician) and Advanced Practice Providers (APPs -  Physician Assistants and Nurse Practitioners) who all work together to provide you with the care you need, when you need it.  We recommend signing up for the patient portal called "MyChart".  Sign up information is provided on this After Visit Summary.  MyChart is used to connect with patients for Virtual Visits (Telemedicine).  Patients are able to view lab/test results, encounter notes, upcoming appointments, etc.  Non-urgent messages can be sent to your provider as well.   To learn more about what you can do with MyChart, go to [email or web address].    Your next appointment:   8 week(s)  The format for your next appointment:   In Person  Provider:   Thomasene Ripple, DO   Other Instructions

## 2020-09-14 LAB — BASIC METABOLIC PANEL
BUN/Creatinine Ratio: 12 (ref 12–28)
BUN: 13 mg/dL (ref 8–27)
CO2: 26 mmol/L (ref 20–29)
Calcium: 9.8 mg/dL (ref 8.7–10.3)
Chloride: 103 mmol/L (ref 96–106)
Creatinine, Ser: 1.11 mg/dL — ABNORMAL HIGH (ref 0.57–1.00)
GFR calc Af Amer: 58 mL/min/{1.73_m2} — ABNORMAL LOW (ref >59)
GFR calc non Af Amer: 50 mL/min/{1.73_m2} — ABNORMAL LOW (ref >59)
Glucose: 178 mg/dL — ABNORMAL HIGH (ref 65–99)
Potassium: 4 mmol/L (ref 3.5–5.2)
Sodium: 142 mmol/L (ref 134–144)

## 2020-09-14 LAB — MAGNESIUM: Magnesium: 1.9 mg/dL (ref 1.6–2.3)

## 2020-09-24 ENCOUNTER — Telehealth: Payer: Self-pay | Admitting: Cardiology

## 2020-09-24 MED ORDER — FUROSEMIDE 40 MG PO TABS
40.0000 mg | ORAL_TABLET | Freq: Every day | ORAL | 3 refills | Status: DC
Start: 2020-09-24 — End: 2021-01-01

## 2020-09-24 NOTE — Telephone Encounter (Signed)
Refill of Furosemide 40 mg sent to [facility] on [address].

## 2020-09-24 NOTE — Telephone Encounter (Signed)
   *  STAT* If patient is at the pharmacy, call can be transferred to refill team.   1. Which medications need to be refilled? (please list name of each medication and dose if known)   furosemide (LASIX) 40 MG tablet    2. Which pharmacy/location (including street and city if local pharmacy) is medication to be sent to? [facility] [phone] - [place], [place] - [address] OF [address]  3. Do they need a 30 day or 90 day supply? 90 days

## 2020-09-30 DIAGNOSIS — R0981 Nasal congestion: Secondary | ICD-10-CM | POA: Insufficient documentation

## 2020-10-04 ENCOUNTER — Other Ambulatory Visit: Payer: Self-pay | Admitting: Cardiology

## 2020-10-05 DIAGNOSIS — I509 Heart failure, unspecified: Secondary | ICD-10-CM

## 2020-10-05 DIAGNOSIS — I447 Left bundle-branch block, unspecified: Secondary | ICD-10-CM

## 2020-10-05 DIAGNOSIS — I2510 Atherosclerotic heart disease of native coronary artery without angina pectoris: Secondary | ICD-10-CM

## 2020-10-05 DIAGNOSIS — E785 Hyperlipidemia, unspecified: Secondary | ICD-10-CM

## 2020-10-05 DIAGNOSIS — D649 Anemia, unspecified: Secondary | ICD-10-CM

## 2020-10-05 DIAGNOSIS — I110 Hypertensive heart disease with heart failure: Secondary | ICD-10-CM

## 2020-10-05 DIAGNOSIS — K219 Gastro-esophageal reflux disease without esophagitis: Secondary | ICD-10-CM

## 2020-10-06 DIAGNOSIS — I509 Heart failure, unspecified: Secondary | ICD-10-CM

## 2020-10-06 DIAGNOSIS — I255 Ischemic cardiomyopathy: Secondary | ICD-10-CM

## 2020-10-06 DIAGNOSIS — I10 Essential (primary) hypertension: Secondary | ICD-10-CM

## 2020-10-06 DIAGNOSIS — E785 Hyperlipidemia, unspecified: Secondary | ICD-10-CM

## 2020-10-06 DIAGNOSIS — F17200 Nicotine dependence, unspecified, uncomplicated: Secondary | ICD-10-CM

## 2020-10-06 DIAGNOSIS — I110 Hypertensive heart disease with heart failure: Secondary | ICD-10-CM

## 2020-10-07 DIAGNOSIS — I110 Hypertensive heart disease with heart failure: Secondary | ICD-10-CM | POA: Diagnosis not present

## 2020-10-07 DIAGNOSIS — I509 Heart failure, unspecified: Secondary | ICD-10-CM | POA: Diagnosis not present

## 2020-10-07 DIAGNOSIS — I10 Essential (primary) hypertension: Secondary | ICD-10-CM | POA: Diagnosis not present

## 2020-10-07 DIAGNOSIS — E785 Hyperlipidemia, unspecified: Secondary | ICD-10-CM | POA: Diagnosis not present

## 2020-10-16 ENCOUNTER — Encounter: Payer: Self-pay | Admitting: Cardiology

## 2020-10-16 ENCOUNTER — Telehealth: Payer: Self-pay | Admitting: Cardiology

## 2020-10-16 ENCOUNTER — Telehealth (INDEPENDENT_AMBULATORY_CARE_PROVIDER_SITE_OTHER): Payer: Medicare Other | Admitting: Cardiology

## 2020-10-16 ENCOUNTER — Other Ambulatory Visit: Payer: Self-pay

## 2020-10-16 VITALS — BP 107/62 | HR 88 | Wt 183.3 lb

## 2020-10-16 DIAGNOSIS — I10 Essential (primary) hypertension: Secondary | ICD-10-CM | POA: Diagnosis not present

## 2020-10-16 DIAGNOSIS — I2510 Atherosclerotic heart disease of native coronary artery without angina pectoris: Secondary | ICD-10-CM

## 2020-10-16 DIAGNOSIS — R0683 Snoring: Secondary | ICD-10-CM

## 2020-10-16 DIAGNOSIS — I519 Heart disease, unspecified: Secondary | ICD-10-CM | POA: Diagnosis not present

## 2020-10-16 DIAGNOSIS — I447 Left bundle-branch block, unspecified: Secondary | ICD-10-CM

## 2020-10-16 DIAGNOSIS — R0989 Other specified symptoms and signs involving the circulatory and respiratory systems: Secondary | ICD-10-CM | POA: Diagnosis not present

## 2020-10-16 NOTE — Telephone Encounter (Signed)
Karen Warner is calling stating she was supposed to discuss having a sleep study performed with Dr. Servando Salina during her appointment today, but they got off track talking about something else. Please advise.

## 2020-10-16 NOTE — Progress Notes (Addendum)
Telephone visit  Date:  10/17/2020   ID:  [name], DOB June 07, 1951, MRN [id number]  The patient is at home having requested a video visit due to inability to have transportation. The Provider in the office  PCP:  Laurel Dimmer, FNP  Cardiologist:  Thomasene Ripple, DO  Electrophysiologist:  None   Evaluation Performed: Posthospitalization follow-up visit  Chief Complaint: Intermittent weight gain  History of Present Illness:    Karen Warner is a 69 y.o. female with a hx of coronary artery disease with recent stent to the LAD in September 2021, ischemic cardiomyopathy and recent EF 20 to 25%, hypertension, hyperlipidemia, current smoker.  I saw the patient on September 13, 2020 at that time she was doing well from a cardiovascular standpoint she had no anginal symptoms, in terms of her cardiomyopathy her medication was optimized she was on Entresto spironolactone carvedilol and Lasix daily.  In the interim the patient has some shortness of breath requiring hospitalization at [facility].  She was actively diuresed and she was discharged home.  Today she is presenting for a follow-up visit via virtual visit due to not being able to have transportation to be here.  She is saddened because she has lost her family member from a car accident.  But she tells me she is doing better with her blood pressure at home.  She has weighed herself recently which showed that she had Increased weight gain and was told by her PCP to take an additional diuretic medication.  She has a home nurse and physical therapy coming in.  The patient {does not have symptoms concerning for COVID-19 infection (fever, chills, cough, or new shortness of breath).    Past Medical History:  Diagnosis Date  . Arthralgia 01/14/2020  . Atherosclerotic heart disease of native coronary artery without angina pectoris 04/13/2014  . CHF (congestive heart failure) (HCC)   . Chronic bilateral  low back pain with bilateral sciatica 01/13/2019  . Chronic bladder pain 11/08/2014   Last Assessment & Plan:  Formatting of this note might be different from the original. Patient has chronic pain syndrome in general I think this is unfortunately worsening her pelvic pain and bladder pain her examination today was very reassuring I am advised her to use the estrogen cream I did start her on Elavil 10 milligrams at that time if she does tolerated will increase it to 25 milligrams.   . Chronic idiopathic constipation 12/22/2014   Formatting of this note might be different from the original. Last Assessment & Plan:  For better bowel emptying please use either Citrucel / Benefiber start with 2 tablespoons daily and titrate up or down to effect.  Also please use glycerin suppositories as needed to assist in evacuation. Last Assessment & Plan:  Formatting of this note might be different from the original. For better bowel empt  . Chronic obstructive pulmonary disease, unspecified (HCC) 03/18/2018  . Class 1 obesity due to excess calories with serious comorbidity and body mass index (BMI) of 31.0 to 31.9 in adult 06/26/2020  . Coronary artery disease   . DDD (degenerative disc disease), cervical 01/13/2019  . Depression 11/05/2019  . Diabetic polyneuropathy associated with type 2 diabetes mellitus (HCC) 02/24/2019  . Diverticulitis 05/20/2018  . Diverticulitis of colon 03/18/2018  . Family history of ischemic heart disease (IHD) 08/16/2013  . Fibromyalgia affecting multiple sites 01/14/2020  . Gastro-esophageal reflux disease without esophagitis 01/13/2019  . Glaucoma 11/08/2014  . History of  anemia 12/12/2019  . Hordeolum externum of left upper eyelid 03/13/2020  . Hypertension 11/08/2014  . Iron deficiency anemia 01/13/2019  . Left renal mass 11/08/2019  . Leukocytosis 05/28/2018  . Low vitamin B12 level 03/13/2020  . Low vitamin D level 12/12/2019  . LV dysfunction 05/31/2019  . Malignant essential hypertension  01/22/2016  . Migraine, unspecified, not intractable, without status migrainosus 11/08/2014  . Mixed hyperlipidemia 01/13/2019  . Myocardial infarction (HCC)   . Other premature beats 11/08/2014  . 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 PVD (peripheral vascular disease) (HCC) 04/08/2017  . 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 Trochanteric bursitis of right hip 04/20/2019  . Type 2 diabetes mellitus without complications (HCC) 12/08/2013  . Vaginal atrophy 11/08/2014   Formatting of this note might be different from the original. Last Assessment & Plan:  For vaginal atrophy please place a pea size dab of Estrogen vaginal cream  into the vagina 3 times a week ( Monday, Wednesday, Friday) Last Assessment & Plan:  Formatting of this note might be different from the original. For vaginal atrophy please place a pea size dab of Estrogen vaginal cream  into the vagina    Past Surgical History:  Procedure Laterality Date  . ABDOMINAL HYSTERECTOMY    . BLEPHAROPLASTY Bilateral   . COLON SURGERY    . CORONARY ANGIOPLASTY WITH STENT PLACEMENT  2020   X3  . CORONARY STENT INTERVENTION N/A 08/03/2020   Procedure: CORONARY STENT INTERVENTION;  Surgeon: Tonny Bollman, MD;  Location: [facility] INVASIVE CV LAB;  Service: Cardiovascular;  Laterality: N/A;  . INTRAVASCULAR PRESSURE WIRE/FFR STUDY N/A 08/03/2020   Procedure: INTRAVASCULAR PRESSURE WIRE/FFR STUDY;  Surgeon: Tonny Bollman, MD;  Location: [facility] INVASIVE CV LAB;  Service: Cardiovascular;  Laterality: N/A;  . LEFT HEART CATH AND CORONARY ANGIOGRAPHY N/A 08/03/2020   Procedure: LEFT HEART CATH AND CORONARY ANGIOGRAPHY;  Surgeon: Tonny Bollman, MD;  Location: [facility] INVASIVE CV LAB;  Service: Cardiovascular;  Laterality: N/A;  . REFRACTIVE SURGERY Left    [place]     No outpatient medications have been marked as taking for the 10/16/20 encounter (Video Visit) with Shameer Molstad, DO.     Allergies:   Bee venom,  Amoxicillin-pot clavulanate, Buprenorphine hcl, Clarithromycin, Duloxetine hcl, Hydrocodone, Lactose, Liraglutide, Oxycodone, and Tramadol hcl   Social History   Tobacco Use  . Smoking status: Former Games developer  . Smokeless tobacco: Never Used  Vaping Use  . Vaping Use: Never used  Substance Use Topics  . Alcohol use: Never  . Drug use: Never     Family Hx: The patient's family history includes Coronary artery disease in her mother; Diabetes in her daughter, maternal grandmother, and mother; Glaucoma in her mother; Hypertension in her mother; Lung cancer in her mother; Migraines in her father and son.  ROS:   Review of Systems  Constitution: Negative for decreased appetite, fever and weight gain.  HENT: Negative for congestion, ear discharge, hoarse voice and sore throat.   Eyes: Negative for discharge, redness, vision loss in right eye and visual halos.  Cardiovascular: Negative for chest pain, dyspnea on exertion, leg swelling, orthopnea and palpitations.  Respiratory: Negative for cough, hemoptysis, shortness of breath and snoring.   Endocrine: Negative for heat intolerance and polyphagia.  Hematologic/Lymphatic: Negative for bleeding problem. Does not bruise/bleed easily.  Skin: Negative for flushing, nail changes, rash and suspicious lesions.  Musculoskeletal: Negative for arthritis, joint pain, muscle cramps, myalgias, neck pain and  stiffness.  Gastrointestinal: Negative for abdominal pain, bowel incontinence, diarrhea and excessive appetite.  Genitourinary: Negative for decreased libido, genital sores and incomplete emptying.  Neurological: Negative for brief paralysis, focal weakness, headaches and loss of balance.  Psychiatric/Behavioral: Negative for altered mental status, depression and suicidal ideas.  Allergic/Immunologic: Negative for HIV exposure and persistent infections.    Prior CV studies:    LHC  Non-stenotic Mid LAD lesion. 1. Severe single-vessel coronary  artery disease with hemodynamically significant severe stenosis of the proximal LAD, treated successfully with PCI using overlapping drug-eluting stents guided by pressure wire analysis 2. Continued patency of the distal stented segment in the LAD, but evidence of a possible chronic dissection at the distal stent edge into a small caliber apical LAD 3. Nonobstructive left circumflex stenosis (dominant circumflex) 4. Small, nondominant RCA with no significant stenosis 5. Normal LVEDP  Continue aspirin and ticagrelor x12 months. Consider discharge later today if no early complications arise. Continue medical therapy for ischemic cardiomyopathy and coronary artery disease.   Labs/Other Tests and Data Reviewed:    EKG:   Recent Labs: 07/13/2020: B Natriuretic Peptide 209.8; Hemoglobin 11.9; Platelets 338 09/13/2020: BUN 13; Creatinine, Ser 1.11; Magnesium 1.9; Potassium 4.0; Sodium 142   Recent Lipid Panel No results found for: CHOL, TRIG, HDL, CHOLHDL, LDLCALC, LDLDIRECT  Wt Readings from Last 3 Encounters:  10/16/20 183 lb 4.8 oz (83.1 kg)  09/13/20 185 lb (83.9 kg)  08/09/20 182 lb 12.8 oz (82.9 kg)     Objective:    Vital Signs:  BP 107/62   Pulse 88   Wt 183 lb 4.8 oz (83.1 kg)   BMI 30.50 kg/m      ASSESSMENT & PLAN:    Assessment: Heart failure with reduced ejection fraction Coronary artery disease status post recent PCI Depressed ejection fraction Ischemic cardiomyopathy Left bundle branch block Hyperlipidemia Snoring  Plan: Based on the patient reporting of her clinical picture she appears to be stable.  She denies any leg edema therefore I suspect at this point she is euvolemic but will continue her current Lasix dosing.  She will stay on her current dose of Entresto, Aldactone, Coreg and to antiplatelet therapy.  She recently had PCI done so I am going to repeat her echocardiogram on guideline directed medical therapy.  If her ejection fraction does not  improve I have discussed with the patient will consider moving to the direction of resynchronization therapy.   She also has significant long-term snoring which we discussed when she was in the hospital, at this time I think it would be beneficial for the patient to undergo sleep study. She will come in 2 weeks for blood work. Follow-up will be in 8 weeks.  COVID-19 Education: The signs and symptoms of COVID-19 were discussed with the patient and how to seek care for testing (follow up with PCP or arrange E-visit).  Time:   Today, I have spent 12 minutes with the patient with telehealth technology discussing the above problems.     Medication Adjustments/Labs and Tests Ordered: Current medicines are reviewed at length with the patient today.  Concerns regarding medicines are outlined above.   Tests Ordered: Orders Placed This Encounter  Procedures  . Basic metabolic panel  . Magnesium    Medication Changes: No orders of the defined types were placed in this encounter.   Follow Up: Follow-up in 8 weeks  Signed, Thomasene Ripple, DO  10/17/2020 7:42 AM    [facility]

## 2020-10-16 NOTE — Telephone Encounter (Signed)
What diagnosis would you like [place] to use?

## 2020-10-16 NOTE — Telephone Encounter (Signed)
Will route to Dr. Servando Salina to see if she wants to order a sleep study for pt.

## 2020-10-16 NOTE — Telephone Encounter (Signed)
Yes please order sleep study for the patient.

## 2020-10-16 NOTE — Patient Instructions (Signed)
Medication Instructions:  Your physician recommends that you continue on your current medications as directed. Please refer to the Current Medication list given to you today.  *If you need a refill on your cardiac medications before your next appointment, please call your pharmacy*   Lab Work: Your physician recommends that you return for lab work 2 weeks: bmp mg  If you have labs (blood work) drawn today and your tests are completely normal, you will receive your results only by: Marland Kitchen MyChart Message (if you have MyChart) OR . A paper copy in the mail If you have any lab test that is abnormal or we need to change your treatment, we will call you to review the results.   Testing/Procedures: None   Follow-Up: At [facility], you and your health needs are our priority.  As part of our continuing mission to provide you with exceptional heart care, we have created designated Provider Care Teams.  These Care Teams include your primary Cardiologist (physician) and Advanced Practice Providers (APPs -  Physician Assistants and Nurse Practitioners) who all work together to provide you with the care you need, when you need it.  We recommend signing up for the patient portal called "MyChart".  Sign up information is provided on this After Visit Summary.  MyChart is used to connect with patients for Virtual Visits (Telemedicine).  Patients are able to view lab/test results, encounter notes, upcoming appointments, etc.  Non-urgent messages can be sent to your provider as well.   To learn more about what you can do with MyChart, go to [email or web address].    Your next appointment:   8 week(s)  The format for your next appointment:   In Person  Provider:   Thomasene Ripple, DO   Other Instructions

## 2020-10-17 DIAGNOSIS — R0683 Snoring: Secondary | ICD-10-CM | POA: Insufficient documentation

## 2020-10-17 NOTE — Addendum Note (Signed)
Addended by: Burnetta Sabin on: 10/17/2020 09:45 AM   Modules accepted: Orders

## 2020-10-17 NOTE — Telephone Encounter (Signed)
Sleep study has been ordered for pt. Called pt and have made her aware that once they get authorization from her insurance company, they will reach out to her to get it arranged.  Pt was very appreciative of the call.

## 2020-10-17 NOTE — Telephone Encounter (Signed)
Please use Snoring

## 2020-10-31 ENCOUNTER — Telehealth: Payer: Self-pay | Admitting: Cardiology

## 2020-10-31 NOTE — Telephone Encounter (Signed)
Spoke with the patient who states that she has been having some shortness of breath with exertion. She denies any swelling. She reports that over thanksgiving she was playing with her granddaughter and had some chest tightness. This went away after she rested. She has not taken any nitroglycerin for episodes of chest tightness. She also reports BP at ortho this morning was 95/54. She states that she did feel a bit lightheaded but also felt like her blood sugar was low.  Patient is currently not having any symptoms.  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 Reviewed nitroglycerin protocol with patient if chest tightness returns without relief as well as ER precautions. Also advised to monitor her BP at home and call back if numbers remain low.

## 2020-10-31 NOTE — Telephone Encounter (Signed)
Pt c/o Shortness Of Breath: STAT if SOB developed within the last 24 hours or pt is noticeably SOB on the phone  1. Are you currently SOB (can you hear that pt is SOB on the phone)? No   2. How long have you been experiencing SOB? Since last heart attack 08/03/20   3. Are you SOB when sitting or when up moving around? When talking or exerting herself   4. Are you currently experiencing any other symptoms? Hypotension (BP was 95/54 at ortho appt today) & Occasional tightness in chest that passes after sitting down to rest  Karen Warner is calling stating her Orthopedic Doctor was concerned with her hypotension and SOB at her appt today and advised her to call. Please advise.

## 2020-11-02 ENCOUNTER — Telehealth: Payer: Self-pay

## 2020-11-02 DIAGNOSIS — A6004 Herpesviral vulvovaginitis: Secondary | ICD-10-CM | POA: Insufficient documentation

## 2020-11-02 LAB — BASIC METABOLIC PANEL
BUN/Creatinine Ratio: 13 (ref 12–28)
BUN: 16 mg/dL (ref 8–27)
CO2: 24 mmol/L (ref 20–29)
Calcium: 9.2 mg/dL (ref 8.7–10.3)
Chloride: 105 mmol/L (ref 96–106)
Creatinine, Ser: 1.19 mg/dL — ABNORMAL HIGH (ref 0.57–1.00)
GFR calc Af Amer: 53 mL/min/{1.73_m2} — ABNORMAL LOW (ref >59)
GFR calc non Af Amer: 46 mL/min/{1.73_m2} — ABNORMAL LOW (ref >59)
Glucose: 258 mg/dL — ABNORMAL HIGH (ref 65–99)
Potassium: 4.6 mmol/L (ref 3.5–5.2)
Sodium: 140 mmol/L (ref 134–144)

## 2020-11-02 LAB — MAGNESIUM: Magnesium: 2 mg/dL (ref 1.6–2.3)

## 2020-11-02 MED ORDER — ISOSORBIDE MONONITRATE ER 30 MG PO TB24: 30.0000 mg | ORAL_TABLET | Freq: Every day | ORAL | 3 refills | Status: DC

## 2020-11-02 NOTE — Telephone Encounter (Signed)
-----   Message from Thomasene Ripple, DO sent at 11/02/2020 10:19 AM EST ----- Creatinine baseline. Also please find out see how she is doing if she still is experiencing intermittent chest pain is go ahead and start her on Imdur 30 mg daily.  If not I like to see her soon.

## 2020-11-02 NOTE — Telephone Encounter (Signed)
Spoke with patient regarding results and recommendation.  Patient verbalizes understanding and is agreeable to plan of care. Advised patient to call back with any issues or concerns.  

## 2020-11-02 NOTE — Addendum Note (Signed)
Addended by: Delorse Limber I on: 11/02/2020 08:24 AM   Modules accepted: Orders

## 2020-11-02 NOTE — Telephone Encounter (Signed)
Spoke to the patient just now and she let me know that she is still having brief episodes of chest pain. I advised that we would start her on Imdur 30 mg daily and she is agreeable to this. Her labs have resulted and are in the chart at this time. I also moved up her appointment for her so that she could be seen sooner.

## 2020-11-02 NOTE — Telephone Encounter (Signed)
Please check with patient - to see how she is doing. She also did not get her blood work done. I would like her to get bmp, mag. If she is still experiencing chest pain - start Imdur 30 mg daily. Also please see if she has an upcoming appointment.

## 2020-11-07 ENCOUNTER — Other Ambulatory Visit: Payer: Self-pay

## 2020-11-08 ENCOUNTER — Ambulatory Visit: Payer: Medicare Other | Admitting: Cardiology

## 2020-11-12 ENCOUNTER — Other Ambulatory Visit: Payer: Self-pay

## 2020-11-12 ENCOUNTER — Ambulatory Visit (INDEPENDENT_AMBULATORY_CARE_PROVIDER_SITE_OTHER): Payer: Medicare Other | Admitting: Cardiology

## 2020-11-12 ENCOUNTER — Encounter: Payer: Self-pay | Admitting: Cardiology

## 2020-11-12 VITALS — BP 130/62 | HR 88 | Ht 65.0 in | Wt 187.0 lb

## 2020-11-12 DIAGNOSIS — I10 Essential (primary) hypertension: Secondary | ICD-10-CM

## 2020-11-12 DIAGNOSIS — I255 Ischemic cardiomyopathy: Secondary | ICD-10-CM

## 2020-11-12 DIAGNOSIS — E1142 Type 2 diabetes mellitus with diabetic polyneuropathy: Secondary | ICD-10-CM

## 2020-11-12 DIAGNOSIS — I2510 Atherosclerotic heart disease of native coronary artery without angina pectoris: Secondary | ICD-10-CM | POA: Diagnosis not present

## 2020-11-12 DIAGNOSIS — E782 Mixed hyperlipidemia: Secondary | ICD-10-CM

## 2020-11-12 MED ORDER — DAPAGLIFLOZIN PROPANEDIOL 10 MG PO TABS: 10.0000 mg | ORAL_TABLET | Freq: Every day | ORAL | 12 refills | Status: DC

## 2020-11-12 MED ORDER — ISOSORBIDE MONONITRATE ER 60 MG PO TB24: 60.0000 mg | ORAL_TABLET | Freq: Every day | ORAL | 3 refills | Status: DC

## 2020-11-12 NOTE — Progress Notes (Signed)
Cardiology Office Note:    Date:  11/12/2020   ID:  Karen Warner, DOB 1951-09-28, MRN [id number]  PCP:  Laurel Dimmer, FNP  Cardiologist:  Thomasene Ripple, DO  Electrophysiologist:  None   Referring MD: Selena Batten *   " I am doing well"   History of Present Illness:    Karen Warner is a 69 y.o. female with a hx of coronary artery disease with recent stent to the [facility] September 2021, ischemic cardiomyopathy and recent EF 20 to 25%, hypertension, hyperlipidemia, current smoker.  I saw the patient on September 13, 2020 at that time she was doing well from a cardiovascular standpoint she had no anginal symptoms, in terms of her cardiomyopathy her medication was optimized she was on Entresto spironolactone carvedilol and Lasix daily.  I saw the patient on November 16 via telemedicine at that time she had just recently been discharged from [facility].  She appears to have been doing well from a cardiovascular standpoint.  Based on her description of her clinical picture I kept the patient on her medication and recommended she come to get some blood work.  We also discussed possibility for repeating an echocardiogram and start discussing possibility for resynchronization therapy.  Today she is here for follow-up visit she is very sedentary she has lost her estranged husband, her best friend is in hospice and her aunt was just recently diagnosed with cancer  Past Medical History:  Diagnosis Date  . Arthralgia 01/14/2020  . Atherosclerotic heart disease of native coronary artery without angina pectoris 04/13/2014  . CHF (congestive heart failure) (HCC)   . Chronic bilateral low back pain with bilateral sciatica 01/13/2019  . Chronic bladder pain 11/08/2014   Last Assessment & Plan:  Formatting of this note might be different from the original. Patient has chronic pain syndrome in general I think this is unfortunately worsening her pelvic pain and  bladder pain her examination today was very reassuring I am advised her to use the estrogen cream I did start her on Elavil 10 milligrams at that time if she does tolerated will increase it to 25 milligrams.   . Chronic idiopathic constipation 12/22/2014   Formatting of this note might be different from the original. Last Assessment & Plan:  For better bowel emptying please use either Citrucel / Benefiber start with 2 tablespoons daily and titrate up or down to effect.  Also please use glycerin suppositories as needed to assist in evacuation. Last Assessment & Plan:  Formatting of this note might be different from the original. For better bowel empt  . Chronic obstructive pulmonary disease, unspecified (HCC) 03/18/2018  . Class 1 obesity due to excess calories with serious comorbidity and body mass index (BMI) of 31.0 to 31.9 in adult 06/26/2020  . Coronary artery disease   . DDD (degenerative disc disease), cervical 01/13/2019  . Depression 11/05/2019  . Diabetic polyneuropathy associated with type 2 diabetes mellitus (HCC) 02/24/2019  . Diverticulitis 05/20/2018  . Diverticulitis of colon 03/18/2018  . Family history of ischemic heart disease (IHD) 08/16/2013  . Fibromyalgia affecting multiple sites 01/14/2020  . Gastro-esophageal reflux disease without esophagitis 01/13/2019  . Glaucoma 11/08/2014  . History of anemia 12/12/2019  . Hordeolum externum of left upper eyelid 03/13/2020  . Hypertension 11/08/2014  . Iron deficiency anemia 01/13/2019  . Left renal mass 11/08/2019  . Leukocytosis 05/28/2018  . Low vitamin B12 level 03/13/2020  . Low vitamin D level 12/12/2019  .  LV dysfunction 05/31/2019  . Malignant essential hypertension 01/22/2016  . Migraine, unspecified, not intractable, without status migrainosus 11/08/2014  . Mixed hyperlipidemia 01/13/2019  . Myocardial infarction (HCC)   . Other premature beats 11/08/2014  . 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 PVD (peripheral  vascular disease) (HCC) 04/08/2017  . 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 Trochanteric bursitis of right hip 04/20/2019  . Type 2 diabetes mellitus without complications (HCC) 12/08/2013  . Vaginal atrophy 11/08/2014   Formatting of this note might be different from the original. Last Assessment & Plan:  For vaginal atrophy please place a pea size dab of Estrogen vaginal cream  into the vagina 3 times a week ( Monday, Wednesday, Friday) Last Assessment & Plan:  Formatting of this note might be different from the original. For vaginal atrophy please place a pea size dab of Estrogen vaginal cream  into the vagina     Past Surgical History:  Procedure Laterality Date  . ABDOMINAL HYSTERECTOMY    . BLEPHAROPLASTY Bilateral   . COLON SURGERY    . CORONARY ANGIOPLASTY WITH STENT PLACEMENT  2020   X3  . CORONARY STENT INTERVENTION N/A 08/03/2020   Procedure: CORONARY STENT INTERVENTION;  Surgeon: Tonny Bollman, MD;  Location: [facility] INVASIVE CV LAB;  Service: Cardiovascular;  Laterality: N/A;  . INTRAVASCULAR PRESSURE WIRE/FFR STUDY N/A 08/03/2020   Procedure: INTRAVASCULAR PRESSURE WIRE/FFR STUDY;  Surgeon: Tonny Bollman, MD;  Location: [facility] INVASIVE CV LAB;  Service: Cardiovascular;  Laterality: N/A;  . LEFT HEART CATH AND CORONARY ANGIOGRAPHY N/A 08/03/2020   Procedure: LEFT HEART CATH AND CORONARY ANGIOGRAPHY;  Surgeon: Tonny Bollman, MD;  Location: [facility] INVASIVE CV LAB;  Service: Cardiovascular;  Laterality: N/A;  . REFRACTIVE SURGERY Left    [place]    Current Medications: Current Meds  Medication Sig  . acetaminophen (TYLENOL) 325 MG tablet Take 2 tablets by mouth every 6 (six) hours as needed.  . ACETAMINOPHEN-BUTALBITAL 50-325 MG TABS Take 1 tablet by mouth every 4 (four) hours as needed.  Marland Kitchen albuterol (PROVENTIL) (2.5 MG/3ML) 0.083% nebulizer solution INL CONTENTS OF 1 VIAL VIA NEBULIZER Q 4 H PRF WHEEZING OR COUGH  . albuterol (VENTOLIN HFA) 108 (90 Base) MCG/ACT inhaler Inhale  2 puffs into the lungs every 6 (six) hours as needed.  Marland Kitchen aspirin 81 MG EC tablet 81 mg daily.  . baclofen (LIORESAL) 10 MG tablet Take 10 mg by mouth daily.   . budesonide (PULMICORT) 1 MG/2ML nebulizer solution Take 1 mg by nebulization in the morning and at bedtime.  . carvedilol (COREG) 6.25 MG tablet Take 1 tablet (6.25 mg total) by mouth 2 (two) times daily with a meal.  . diclofenac Sodium (VOLTAREN) 1 % GEL Apply 2 g topically 2 (two) times daily as needed.  . dicyclomine (BENTYL) 10 MG capsule Take 10 mg by mouth every 6 (six) hours as needed for spasms.  Marland Kitchen ENTRESTO 24-26 MG TAKE 1 TABLET BY MOUTH TWICE DAILY  . esomeprazole (NEXIUM) 40 MG capsule Take 40 mg by mouth daily at 12 noon.  . famotidine (PEPCID) 40 MG tablet Take 40 mg by mouth at bedtime.  . ferrous sulfate 324 MG TBEC Take 324 mg by mouth daily with breakfast.  . folic acid (FOLVITE) 1 MG tablet Take 1 mg by mouth daily.  . furosemide (LASIX) 40 MG tablet Take 1 tablet (40 mg total) by mouth daily.  . insulin lispro (HUMALOG) 100 UNIT/ML KwikPen Inject 16  Units into the skin 3 (three) times daily. Administer 16 units three times daily before a meal per sliding scale  . loratadine (CLARITIN) 10 MG tablet Take 10 mg by mouth daily.  Marland Kitchen MAGNESIUM-OXIDE 400 (241.3 Mg) MG tablet Take 400 mg by mouth daily.  . nitroGLYCERIN (NITROSTAT) 0.4 MG SL tablet Place 1 tablet (0.4 mg total) under the tongue every 5 (five) minutes as needed for chest pain.  Marland Kitchen nystatin (MYCOSTATIN) 100000 UNIT/ML suspension Take 5 mLs by mouth 4 (four) times daily.  . potassium chloride (KLOR-CON) 10 MEQ tablet Take 10 mEq by mouth daily.  . predniSONE (DELTASONE) 5 MG tablet Take by mouth.  . pregabalin (LYRICA) 50 MG capsule Take 50 mg by mouth 3 (three) times daily.  . rosuvastatin (CRESTOR) 40 MG tablet Take 40 mg by mouth daily.  Marland Kitchen spironolactone (ALDACTONE) 25 MG tablet Take 0.5 tablets (12.5 mg total) by mouth daily.  . SYMBICORT 80-4.5 MCG/ACT  inhaler SMARTSIG:2 Puff(s) By Mouth Twice Daily  . ticagrelor (BRILINTA) 90 MG TABS tablet Take 1 tablet (90 mg total) by mouth in the morning and at bedtime.  . torsemide (DEMADEX) 20 MG tablet Take 10 mg by mouth daily.  Evaristo Bury FLEXTOUCH 200 UNIT/ML FlexTouch Pen Inject 40 Units into the skin 2 (two) times daily.  . TRINTELLIX 5 MG TABS tablet Take 5 mg by mouth daily.  . vitamin B-12 (CYANOCOBALAMIN) 1000 MCG tablet Take 1,000 mcg by mouth daily.  Marland Kitchen WAL-DRYL ALLERGY 12.5 MG/5ML liquid Take 12.5 mg by mouth.  . [DISCONTINUED] isosorbide mononitrate (IMDUR) 30 MG 24 hr tablet Take 1 tablet (30 mg total) by mouth daily.     Allergies:   Bee venom, Sumatriptan, Tramadol, Amoxicillin-pot clavulanate, Buprenorphine hcl, Clarithromycin, Duloxetine hcl, Hydrocodone, Lactose, Liraglutide, Tramadol hcl, and Oxycodone   Social History   Socioeconomic History  . Marital status: Married    Spouse name: Not on file  . Number of children: Not on file  . Years of education: Not on file  . Highest education level: Not on file  Occupational History  . Not on file  Tobacco Use  . Smoking status: Former Games developer  . Smokeless tobacco: Never Used  Vaping Use  . Vaping Use: Never used  Substance and Sexual Activity  . Alcohol use: Never  . Drug use: Never  . Sexual activity: Not on file  Other Topics Concern  . Not on file  Social History Narrative  . Not on file   Social Determinants of Health   Financial Resource Strain: Not on file  Food Insecurity: Not on file  Transportation Needs: Not on file  Physical Activity: Not on file  Stress: Not on file  Social Connections: Not on file     Family History: The patient's family history includes Coronary artery disease in her mother; Diabetes in her daughter, maternal grandmother, and mother; Glaucoma in her mother; Hypertension in her mother; Lung cancer in her mother; Migraines in her father and son.  ROS:   Review of Systems   Constitution: Negative for decreased appetite, fever and weight gain.  HENT: Negative for congestion, ear discharge, hoarse voice and sore throat.   Eyes: Negative for discharge, redness, vision loss in right eye and visual halos.  Cardiovascular: Negative for chest pain, dyspnea on exertion, leg swelling, orthopnea and palpitations.  Respiratory: Negative for cough, hemoptysis, shortness of breath and snoring.   Endocrine: Negative for heat intolerance and polyphagia.  Hematologic/Lymphatic: Negative for bleeding problem. Does not bruise/bleed  easily.  Skin: Negative for flushing, nail changes, rash and suspicious lesions.  Musculoskeletal: Negative for arthritis, joint pain, muscle cramps, myalgias, neck pain and stiffness.  Gastrointestinal: Negative for abdominal pain, bowel incontinence, diarrhea and excessive appetite.  Genitourinary: Negative for decreased libido, genital sores and incomplete emptying.  Neurological: Negative for brief paralysis, focal weakness, headaches and loss of balance.  Psychiatric/Behavioral: Negative for altered mental status, depression and suicidal ideas.  Allergic/Immunologic: Negative for HIV exposure and persistent infections.    EKGs/Labs/Other Studies Reviewed:    The following studies were reviewed today:   EKG: None today    Recent Labs: 07/13/2020: B Natriuretic Peptide 209.8; Hemoglobin 11.9; Platelets 338 11/01/2020: BUN 16; Creatinine, Ser 1.19; Magnesium 2.0; Potassium 4.6; Sodium 140  Recent Lipid Panel No results found for: CHOL, TRIG, HDL, CHOLHDL, VLDL, LDLCALC, LDLDIRECT  Physical Exam:    VS:  BP 130/62   Pulse 88   Ht 5\' 5"  (1.651 m)   Wt 187 lb (84.8 kg)   SpO2 98%   BMI 31.12 kg/m     Wt Readings from Last 3 Encounters:  11/12/20 187 lb (84.8 kg)  10/16/20 183 lb 4.8 oz (83.1 kg)  09/13/20 185 lb (83.9 kg)     GEN: Well nourished, well developed in no acute distress HEENT: Normal NECK: No JVD; No carotid  bruits LYMPHATICS: No lymphadenopathy CARDIAC: S1S2 noted,RRR, no murmurs, rubs, gallops RESPIRATORY:  Clear to auscultation without rales, wheezing or rhonchi  ABDOMEN: Soft, non-tender, non-distended, +bowel sounds, no guarding. EXTREMITIES: No edema, No cyanosis, no clubbing MUSCULOSKELETAL:  No deformity  SKIN: Warm and dry NEUROLOGIC:  Alert and oriented x 3, non-focal PSYCHIATRIC:  Normal affect, good insight  ASSESSMENT:    1. Ischemic cardiomyopathy   2. Primary hypertension   3. Coronary artery disease involving native heart without angina pectoris, unspecified vessel or lesion type   4. Diabetic polyneuropathy associated with type 2 diabetes mellitus (HCC)   5. Mixed hyperlipidemia    PLAN:      Clinically she appears to be improving and she is euvolemic today in the office.  I will keep her on her current Lasix dosing.  Keep her Entresto, Aldactone and carvedilol.  I like to add 09/15/20 to her medication regimen.  We discussed repeating her echocardiogram in January to assess LV function.  I am concerned about this patient because of all the great stresses in his life that she is at high risk for stress-induced cardiomyopathy over a superimposed ischemic cardiomyopathy.  I have educated patient about the signs and symptoms expect in this situation.  I will see the patient in 1 month.  At her last visit I set the patient up for sleep study this is still pending.  Her blood pressure is acceptable in the office today.  Coronary artery disease, she has had some intermittent angina I am going to increase her Imdur to 60 mg daily.  Continue her aspirin and her Brilinta and rosuvastatin.  The patient is in agreement with the above plan. The patient left the office in stable condition.  The patient will follow up in   Medication Adjustments/Labs and Tests Ordered: Current medicines are reviewed at length with the patient today.  Concerns regarding medicines are outlined above.   Orders Placed This Encounter  Procedures  . Basic metabolic panel  . Magnesium  . ECHOCARDIOGRAM COMPLETE   Meds ordered this encounter  Medications  . isosorbide mononitrate (IMDUR) 60 MG 24 hr tablet  Sig: Take 1 tablet (60 mg total) by mouth daily.    Dispense:  90 tablet    Refill:  3  . dapagliflozin propanediol (FARXIGA) 10 MG TABS tablet    Sig: Take 1 tablet (10 mg total) by mouth daily before breakfast.    Dispense:  30 tablet    Refill:  12    Patient Instructions  Medication Instructions:  Your physician has recommended you make the following change in your medication:   Start Farxiga 10 mg daily. Increase Imdur 60 mg daily.  *If you need a refill on your cardiac medications before your next appointment, please call your pharmacy*   Lab Work: Your physician recommends that you have labs done in the office today. Your test included  basic metabolic panel and mganesium.  If you have labs (blood work) drawn today and your tests are completely normal, you will receive your results only by: Marland Kitchen MyChart Message (if you have MyChart) OR . A paper copy in the mail If you have any lab test that is abnormal or we need to change your treatment, we will call you to review the results.   Testing/Procedures:  Your physician has requested that you have an echocardiogram. Echocardiography is a painless test that uses sound waves to create images of your heart. It provides your doctor with information about the size and shape of your heart and how well your heart's chambers and valves are working. This procedure takes approximately one hour. There are no restrictions for this procedure.     Follow-Up: At [facility], you and your health needs are our priority.  As part of our continuing mission to provide you with exceptional heart care, we have created designated Provider Care Teams.  These Care Teams include your primary Cardiologist (physician) and Advanced Practice  Providers (APPs -  Physician Assistants and Nurse Practitioners) who all work together to provide you with the care you need, when you need it.  We recommend signing up for the patient portal called "MyChart".  Sign up information is provided on this After Visit Summary.  MyChart is used to connect with patients for Virtual Visits (Telemedicine).  Patients are able to view lab/test results, encounter notes, upcoming appointments, etc.  Non-urgent messages can be sent to your provider as well.   To learn more about what you can do with MyChart, go to [email or web address].    Your next appointment:   1 month(s)  The format for your next appointment:   In Person  Provider:   Thomasene Ripple, DO   Other Instructions Isosorbide Mononitrate extended-release tablets What is this medicine? ISOSORBIDE MONONITRATE (eye soe SOR bide mon oh NYE trate) is a vasodilator. It relaxes blood vessels, increasing the blood and oxygen supply to your heart. This medicine is used to prevent chest pain caused by angina. It will not help to stop an episode of chest pain. This medicine may be used for other purposes; ask your health care provider or pharmacist if you have questions. COMMON BRAND NAME(S): Imdur, Isotrate ER What should I tell my health care provider before I take this medicine? They need to know if you have any of these conditions:  previous heart attack or heart failure  an unusual or allergic reaction to isosorbide mononitrate, nitrates, other medicines, foods, dyes, or preservatives  pregnant or trying to get pregnant  breast-feeding How should I use this medicine? Take this medicine by mouth with a glass of water. Follow the directions on the  prescription label. Do not crush or chew. Take your medicine at regular intervals. Do not take your medicine more often than directed. Do not stop taking this medicine except on the advice of your doctor or health care professional. Talk to your  pediatrician regarding the use of this medicine in children. Special care may be needed. Overdosage: If you think you have taken too much of this medicine contact a poison control center or emergency room at once. NOTE: This medicine is only for you. Do not share this medicine with others. What if I miss a dose? If you miss a dose, take it as soon as you can. If it is almost time for your next dose, take only that dose. Do not take double or extra doses. What may interact with this medicine? Do not take this medicine with any of the following medications:  medicines used to treat erectile dysfunction (ED) like avanafil, sildenafil, tadalafil, and vardenafil  riociguat This medicine may also interact with the following medications:  medicines for high blood pressure  other medicines for angina or heart failure This list may not describe all possible interactions. Give your health care provider a list of all the medicines, herbs, non-prescription drugs, or dietary supplements you use. Also tell them if you smoke, drink alcohol, or use illegal drugs. Some items may interact with your medicine. What should I watch for while using this medicine? Check your heart rate and blood pressure regularly while you are taking this medicine. Ask your doctor or health care professional what your heart rate and blood pressure should be and when you should contact him or her. Tell your doctor or health care professional if you feel your medicine is no longer working. You may get dizzy. Do not drive, use machinery, or do anything that needs mental alertness until you know how this medicine affects you. To reduce the risk of dizzy or fainting spells, do not sit or stand up quickly, especially if you are an older patient. Alcohol can make you more dizzy, and increase flushing and rapid heartbeats. Avoid alcoholic drinks. Do not treat yourself for coughs, colds, or pain while you are taking this medicine without asking  your doctor or health care professional for advice. Some ingredients may increase your blood pressure. What side effects may I notice from receiving this medicine? Side effects that you should report to your doctor or health care professional as soon as possible:  bluish discoloration of lips, fingernails, or palms of hands  irregular heartbeat, palpitations  low blood pressure  nausea, vomiting  persistent headache  unusually weak or tired Side effects that usually do not require medical attention (report to your doctor or health care professional if they continue or are bothersome):  flushing of the face or neck  rash This list may not describe all possible side effects. Call your doctor for medical advice about side effects. You may report side effects to FDA at 1-800-FDA-1088. Where should I keep my medicine? Keep out of the reach of children. Store between 15 and 30 degrees C (59 and 86 degrees F). Keep container tightly closed. Throw away any unused medicine after the expiration date. NOTE: This sheet is a summary. It may not cover all possible information. If you have questions about this medicine, talk to your doctor, pharmacist, or health care provider.  2020 Elsevier/Gold Standard (2013-09-16 14:48:19) Dapagliflozin tablets What is this medicine? DAPAGLIFLOZIN (DAP a gli FLOE zin) controls blood sugar in people with diabetes. It is  used with lifestyle changes like diet and exercise. It also treats heart failure. It may lower the need for treatment of heart failure in the hospital. This medicine may be used for other purposes; ask your health care provider or pharmacist if you have questions. COMMON BRAND NAME(S): Marcelline Deist What should I tell my health care provider before I take this medicine? They need to know if you have any of these conditions:  dehydration  diabetic ketoacidosis  diet low in salt  eating less due to illness, surgery, dieting, or any other  reason  having surgery  history of pancreatitis or pancreas problems  history of yeast infection of the penis or vagina  if you often drink alcohol  infections in the bladder, kidneys, or urinary tract  kidney disease  low blood pressure  on hemodialysis  problems urinating  type 1 diabetes  uncircumcised female  an unusual or allergic reaction to dapagliflozin, other medicines, foods, dyes, or preservatives  pregnant or trying to get pregnant  breast-feeding How should I use this medicine? Take this medicine by mouth with a glass of water. Follow the directions on the prescription label. You can take it with or without food. If it upsets your stomach, take it with food. Take this medicine in the morning. Take your dose at the same time each day. Do not take more often than directed. Do not stop taking except on your doctor's advice. A special MedGuide will be given to you by the pharmacist with each prescription and refill. Be sure to read this information carefully each time. Talk to your pediatrician regarding the use of this medicine in children. Special care may be needed. Overdosage: If you think you have taken too much of this medicine contact a poison control center or emergency room at once. NOTE: This medicine is only for you. Do not share this medicine with others. What if I miss a dose? If you miss a dose, take it as soon as you can. If it is almost time for your next dose, take only that dose. Do not take double or extra doses. What may interact with this medicine? Do not take this medicine with any of the following medications:  gatifloxacin This medicine may also interact with the following medications:  alcohol  certain medicines for blood pressure, heart disease  diuretics  insulin  nateglinide  pioglitazone  quinolone antibiotics like ciprofloxacin, levofloxacin, ofloxacin  repaglinide  some herbal dietary supplements  steroid medicines  like prednisone or cortisone  sulfonylureas like glimepiride, glipizide, glyburide  thyroid medicine This list may not describe all possible interactions. Give your health care provider a list of all the medicines, herbs, non-prescription drugs, or dietary supplements you use. Also tell them if you smoke, drink alcohol, or use illegal drugs. Some items may interact with your medicine. What should I watch for while using this medicine? Visit your doctor or health care professional for regular checks on your progress. This medicine can cause a serious condition in which there is too much acid in the blood. If you develop nausea, vomiting, stomach pain, unusual tiredness, or breathing problems, stop taking this medicine and call your doctor right away. If possible, use a ketone dipstick to check for ketones in your urine. A test called the HbA1C (A1C) will be monitored. This is a simple blood test. It measures your blood sugar control over the last 2 to 3 months. You will receive this test every 3 to 6 months. Learn how to check  your blood sugar. Learn the symptoms of low and high blood sugar and how to manage them. Always carry a quick-source of sugar with you in case you have symptoms of low blood sugar. Examples include hard sugar candy or glucose tablets. Make sure others know that you can choke if you eat or drink when you develop serious symptoms of low blood sugar, such as seizures or unconsciousness. They must get medical help at once. Tell your doctor or health care professional if you have high blood sugar. You might need to change the dose of your medicine. If you are sick or exercising more than usual, you might need to change the dose of your medicine. Do not skip meals. Ask your doctor or health care professional if you should avoid alcohol. Many nonprescription cough and cold products contain sugar or alcohol. These can affect blood sugar. Wear a medical ID bracelet or chain, and carry a  card that describes your disease and details of your medicine and dosage times. What side effects may I notice from receiving this medicine? Side effects that you should report to your doctor or health care professional as soon as possible:  allergic reactions like skin rash, itching or hives, swelling of the face, lips, or tongue  breathing problems  dizziness  feeling faint or lightheaded, falls  muscle weakness  nausea, vomiting, unusual stomach upset or pain  new pain or tenderness, change in skin color, sores or ulcers, or infection in legs or feet  penile discharge, itching, or pain in men  signs and symptoms of a genital infection, such as fever; tenderness, redness, or swelling in the genitals or area from the genitals to the back of the rectum  signs and symptoms of low blood sugar such as feeling anxious, confusion, dizziness, increased hunger, unusually weak or tired, sweating, shakiness, cold, irritable, headache, blurred vision, fast heartbeat, loss of consciousness  signs and symptoms of a urinary tract infection, such as fever, chills, a burning feeling when urinating, blood in the urine, back pain  trouble passing urine or change in the amount of urine, including an urgent need to urinate more often, in larger amounts, or at night  unusual tiredness  vaginal discharge, itching, or odor in women Side effects that usually do not require medical attention (report to your doctor or health care professional if they continue or are bothersome):  mild increase in urination  thirsty This list may not describe all possible side effects. Call your doctor for medical advice about side effects. You may report side effects to FDA at 1-800-FDA-1088. Where should I keep my medicine? Keep out of the reach of children. Store at room temperature between 15 and 30 degrees C (59 and 86 degrees F). Throw away any unused medicine after the expiration date. NOTE: This sheet is a  summary. It may not cover all possible information. If you have questions about this medicine, talk to your doctor, pharmacist, or health care provider.  2020 Elsevier/Gold Standard (2019-04-07 18:58:14)      Adopting a Healthy Lifestyle.  Know what a healthy weight is for you (roughly BMI <25) and aim to maintain this   Aim for 7+ servings of fruits and vegetables daily   65-80+ fluid ounces of water or unsweet tea for healthy kidneys   Limit to max 1 drink of alcohol per day; avoid smoking/tobacco   Limit animal fats in diet for cholesterol and heart health - choose grass fed whenever available   Avoid highly  processed foods, and foods high in saturated/trans fats   Aim for low stress - take time to unwind and care for your mental health   Aim for 150 min of moderate intensity exercise weekly for heart health, and weights twice weekly for bone health   Aim for 7-9 hours of sleep daily   When it comes to diets, agreement about the perfect plan isnt easy to find, even among the experts. Experts at the [facility] [employer] developed an idea known as the Healthy Eating Plate. Just imagine a plate divided into logical, healthy portions.   The emphasis is on diet quality:   Load up on vegetables and fruits - one-half of your plate: Aim for color and variety, and remember that potatoes dont count.   Go for whole grains - one-quarter of your plate: Whole wheat, barley, wheat berries, quinoa, oats, brown rice, and foods made with them. If you want pasta, go with whole wheat pasta.   Protein power - one-quarter of your plate: Fish, chicken, beans, and nuts are all healthy, versatile protein sources. Limit red meat.   The diet, however, does go beyond the plate, offering a few other suggestions.   Use healthy plant oils, such as olive, canola, soy, corn, sunflower and peanut. Check the labels, and avoid partially hydrogenated oil, which have unhealthy trans fats.   If  youre thirsty, drink water. Coffee and tea are good in moderation, but skip sugary drinks and limit milk and dairy products to one or two daily servings.   The type of carbohydrate in the diet is more important than the amount. Some sources of carbohydrates, such as vegetables, fruits, whole grains, and beans-are healthier than others.   Finally, stay active  Signed, Thomasene Ripple, DO  11/12/2020 5:33 PM    [facility]

## 2020-11-12 NOTE — Patient Instructions (Signed)
Medication Instructions:  Your physician has recommended you make the following change in your medication:   Start Farxiga 10 mg daily. Increase Imdur 60 mg daily.  *If you need a refill on your cardiac medications before your next appointment, please call your pharmacy*   Lab Work: Your physician recommends that you have labs done in the office today. Your test included  basic metabolic panel and mganesium.  If you have labs (blood work) drawn today and your tests are completely normal, you will receive your results only by: Marland Kitchen MyChart Message (if you have MyChart) OR . A paper copy in the mail If you have any lab test that is abnormal or we need to change your treatment, we will call you to review the results.   Testing/Procedures:  Your physician has requested that you have an echocardiogram. Echocardiography is a painless test that uses sound waves to create images of your heart. It provides your doctor with information about the size and shape of your heart and how well your heart's chambers and valves are working. This procedure takes approximately one hour. There are no restrictions for this procedure.     Follow-Up: At [facility], you and your health needs are our priority.  As part of our continuing mission to provide you with exceptional heart care, we have created designated Provider Care Teams.  These Care Teams include your primary Cardiologist (physician) and Advanced Practice Providers (APPs -  Physician Assistants and Nurse Practitioners) who all work together to provide you with the care you need, when you need it.  We recommend signing up for the patient portal called "MyChart".  Sign up information is provided on this After Visit Summary.  MyChart is used to connect with patients for Virtual Visits (Telemedicine).  Patients are able to view lab/test results, encounter notes, upcoming appointments, etc.  Non-urgent messages can be sent to your provider as well.   To  learn more about what you can do with MyChart, go to [email or web address].    Your next appointment:   1 month(s)  The format for your next appointment:   In Person  Provider:   Thomasene Ripple, DO   Other Instructions Isosorbide Mononitrate extended-release tablets What is this medicine? ISOSORBIDE MONONITRATE (eye soe SOR bide mon oh NYE trate) is a vasodilator. It relaxes blood vessels, increasing the blood and oxygen supply to your heart. This medicine is used to prevent chest pain caused by angina. It will not help to stop an episode of chest pain. This medicine may be used for other purposes; ask your health care provider or pharmacist if you have questions. COMMON BRAND NAME(S): Imdur, Isotrate ER What should I tell my health care provider before I take this medicine? They need to know if you have any of these conditions:  previous heart attack or heart failure  an unusual or allergic reaction to isosorbide mononitrate, nitrates, other medicines, foods, dyes, or preservatives  pregnant or trying to get pregnant  breast-feeding How should I use this medicine? Take this medicine by mouth with a glass of water. Follow the directions on the prescription label. Do not crush or chew. Take your medicine at regular intervals. Do not take your medicine more often than directed. Do not stop taking this medicine except on the advice of your doctor or health care professional. Talk to your pediatrician regarding the use of this medicine in children. Special care may be needed. Overdosage: If you think you have taken too much of  this medicine contact a poison control center or emergency room at once. NOTE: This medicine is only for you. Do not share this medicine with others. What if I miss a dose? If you miss a dose, take it as soon as you can. If it is almost time for your next dose, take only that dose. Do not take double or extra doses. What may interact with this medicine? Do not  take this medicine with any of the following medications:  medicines used to treat erectile dysfunction (ED) like avanafil, sildenafil, tadalafil, and vardenafil  riociguat This medicine may also interact with the following medications:  medicines for high blood pressure  other medicines for angina or heart failure This list may not describe all possible interactions. Give your health care provider a list of all the medicines, herbs, non-prescription drugs, or dietary supplements you use. Also tell them if you smoke, drink alcohol, or use illegal drugs. Some items may interact with your medicine. What should I watch for while using this medicine? Check your heart rate and blood pressure regularly while you are taking this medicine. Ask your doctor or health care professional what your heart rate and blood pressure should be and when you should contact him or her. Tell your doctor or health care professional if you feel your medicine is no longer working. You may get dizzy. Do not drive, use machinery, or do anything that needs mental alertness until you know how this medicine affects you. To reduce the risk of dizzy or fainting spells, do not sit or stand up quickly, especially if you are an older patient. Alcohol can make you more dizzy, and increase flushing and rapid heartbeats. Avoid alcoholic drinks. Do not treat yourself for coughs, colds, or pain while you are taking this medicine without asking your doctor or health care professional for advice. Some ingredients may increase your blood pressure. What side effects may I notice from receiving this medicine? Side effects that you should report to your doctor or health care professional as soon as possible:  bluish discoloration of lips, fingernails, or palms of hands  irregular heartbeat, palpitations  low blood pressure  nausea, vomiting  persistent headache  unusually weak or tired Side effects that usually do not require medical  attention (report to your doctor or health care professional if they continue or are bothersome):  flushing of the face or neck  rash This list may not describe all possible side effects. Call your doctor for medical advice about side effects. You may report side effects to FDA at 1-800-FDA-1088. Where should I keep my medicine? Keep out of the reach of children. Store between 15 and 30 degrees C (59 and 86 degrees F). Keep container tightly closed. Throw away any unused medicine after the expiration date. NOTE: This sheet is a summary. It may not cover all possible information. If you have questions about this medicine, talk to your doctor, pharmacist, or health care provider.  2020 Elsevier/Gold Standard (2013-09-16 14:48:19) Dapagliflozin tablets What is this medicine? DAPAGLIFLOZIN (DAP a gli FLOE zin) controls blood sugar in people with diabetes. It is used with lifestyle changes like diet and exercise. It also treats heart failure. It may lower the need for treatment of heart failure in the hospital. This medicine may be used for other purposes; ask your health care provider or pharmacist if you have questions. COMMON BRAND NAME(S): Marcelline Deist What should I tell my health care provider before I take this medicine? They need to know  if you have any of these conditions:  dehydration  diabetic ketoacidosis  diet low in salt  eating less due to illness, surgery, dieting, or any other reason  having surgery  history of pancreatitis or pancreas problems  history of yeast infection of the penis or vagina  if you often drink alcohol  infections in the bladder, kidneys, or urinary tract  kidney disease  low blood pressure  on hemodialysis  problems urinating  type 1 diabetes  uncircumcised female  an unusual or allergic reaction to dapagliflozin, other medicines, foods, dyes, or preservatives  pregnant or trying to get pregnant  breast-feeding How should I use this  medicine? Take this medicine by mouth with a glass of water. Follow the directions on the prescription label. You can take it with or without food. If it upsets your stomach, take it with food. Take this medicine in the morning. Take your dose at the same time each day. Do not take more often than directed. Do not stop taking except on your doctor's advice. A special MedGuide will be given to you by the pharmacist with each prescription and refill. Be sure to read this information carefully each time. Talk to your pediatrician regarding the use of this medicine in children. Special care may be needed. Overdosage: If you think you have taken too much of this medicine contact a poison control center or emergency room at once. NOTE: This medicine is only for you. Do not share this medicine with others. What if I miss a dose? If you miss a dose, take it as soon as you can. If it is almost time for your next dose, take only that dose. Do not take double or extra doses. What may interact with this medicine? Do not take this medicine with any of the following medications:  gatifloxacin This medicine may also interact with the following medications:  alcohol  certain medicines for blood pressure, heart disease  diuretics  insulin  nateglinide  pioglitazone  quinolone antibiotics like ciprofloxacin, levofloxacin, ofloxacin  repaglinide  some herbal dietary supplements  steroid medicines like prednisone or cortisone  sulfonylureas like glimepiride, glipizide, glyburide  thyroid medicine This list may not describe all possible interactions. Give your health care provider a list of all the medicines, herbs, non-prescription drugs, or dietary supplements you use. Also tell them if you smoke, drink alcohol, or use illegal drugs. Some items may interact with your medicine. What should I watch for while using this medicine? Visit your doctor or health care professional for regular checks on  your progress. This medicine can cause a serious condition in which there is too much acid in the blood. If you develop nausea, vomiting, stomach pain, unusual tiredness, or breathing problems, stop taking this medicine and call your doctor right away. If possible, use a ketone dipstick to check for ketones in your urine. A test called the HbA1C (A1C) will be monitored. This is a simple blood test. It measures your blood sugar control over the last 2 to 3 months. You will receive this test every 3 to 6 months. Learn how to check your blood sugar. Learn the symptoms of low and high blood sugar and how to manage them. Always carry a quick-source of sugar with you in case you have symptoms of low blood sugar. Examples include hard sugar candy or glucose tablets. Make sure others know that you can choke if you eat or drink when you develop serious symptoms of low blood sugar, such as  seizures or unconsciousness. They must get medical help at once. Tell your doctor or health care professional if you have high blood sugar. You might need to change the dose of your medicine. If you are sick or exercising more than usual, you might need to change the dose of your medicine. Do not skip meals. Ask your doctor or health care professional if you should avoid alcohol. Many nonprescription cough and cold products contain sugar or alcohol. These can affect blood sugar. Wear a medical ID bracelet or chain, and carry a card that describes your disease and details of your medicine and dosage times. What side effects may I notice from receiving this medicine? Side effects that you should report to your doctor or health care professional as soon as possible:  allergic reactions like skin rash, itching or hives, swelling of the face, lips, or tongue  breathing problems  dizziness  feeling faint or lightheaded, falls  muscle weakness  nausea, vomiting, unusual stomach upset or pain  new pain or tenderness, change  in skin color, sores or ulcers, or infection in legs or feet  penile discharge, itching, or pain in men  signs and symptoms of a genital infection, such as fever; tenderness, redness, or swelling in the genitals or area from the genitals to the back of the rectum  signs and symptoms of low blood sugar such as feeling anxious, confusion, dizziness, increased hunger, unusually weak or tired, sweating, shakiness, cold, irritable, headache, blurred vision, fast heartbeat, loss of consciousness  signs and symptoms of a urinary tract infection, such as fever, chills, a burning feeling when urinating, blood in the urine, back pain  trouble passing urine or change in the amount of urine, including an urgent need to urinate more often, in larger amounts, or at night  unusual tiredness  vaginal discharge, itching, or odor in women Side effects that usually do not require medical attention (report to your doctor or health care professional if they continue or are bothersome):  mild increase in urination  thirsty This list may not describe all possible side effects. Call your doctor for medical advice about side effects. You may report side effects to FDA at 1-800-FDA-1088. Where should I keep my medicine? Keep out of the reach of children. Store at room temperature between 15 and 30 degrees C (59 and 86 degrees F). Throw away any unused medicine after the expiration date. NOTE: This sheet is a summary. It may not cover all possible information. If you have questions about this medicine, talk to your doctor, pharmacist, or health care provider.  2020 Elsevier/Gold Standard (2019-04-07 18:58:14)

## 2020-11-13 ENCOUNTER — Telehealth: Payer: Self-pay

## 2020-11-13 LAB — BASIC METABOLIC PANEL
BUN/Creatinine Ratio: 15 (ref 12–28)
BUN: 23 mg/dL (ref 8–27)
CO2: 23 mmol/L (ref 20–29)
Calcium: 9.4 mg/dL (ref 8.7–10.3)
Chloride: 102 mmol/L (ref 96–106)
Creatinine, Ser: 1.58 mg/dL — ABNORMAL HIGH (ref 0.57–1.00)
GFR calc Af Amer: 38 mL/min/{1.73_m2} — ABNORMAL LOW (ref >59)
GFR calc non Af Amer: 33 mL/min/{1.73_m2} — ABNORMAL LOW (ref >59)
Glucose: 349 mg/dL — ABNORMAL HIGH (ref 65–99)
Potassium: 4.5 mmol/L (ref 3.5–5.2)
Sodium: 137 mmol/L (ref 134–144)

## 2020-11-13 LAB — MAGNESIUM: Magnesium: 1.9 mg/dL (ref 1.6–2.3)

## 2020-11-13 NOTE — Telephone Encounter (Signed)
Spoke with patient regarding results and recommendation.  Patient verbalizes understanding and is agreeable to plan of care. Advised patient to call back with any issues or concerns.  

## 2020-11-13 NOTE — Telephone Encounter (Signed)
-----   Message from Thomasene Ripple, DO sent at 11/13/2020  9:30 AM EST ----- Creatinine slightly elevated, will repeat blood work in 1 month when you come for your next visit.  Your sugars are also elevated.  Please let your PCP know that I also started you on Farxiga

## 2020-11-16 ENCOUNTER — Telehealth: Payer: Self-pay | Admitting: Cardiology

## 2020-11-16 NOTE — Telephone Encounter (Signed)
Spoke to the patient just now and let her know that Dr. Servando Salina does recommend that she proceed to the ED to be evaluated if she feels like she could be losing consciousness. She verbalizes understanding of this and states that her sleep study has not been scheduled as of yet.   I let her know that I would look into this for her.

## 2020-11-16 NOTE — Telephone Encounter (Signed)
New Message:     Please call, pt is having problems with her medications. She does not know which medicine it is. Very concerned, keep dropping off to sleep and waking up and not remembering certain things that have happen.

## 2020-11-16 NOTE — Telephone Encounter (Signed)
Spoke to the patient just now who let me know that she has been having trouble staying awake during the day. She states that she does not know if she is falling asleep or if she is losing consciousness. She states that this started when she was last in the hospital. She states that she thinks she is sleeping but she is unsure. She will wake up and she will not remember what was going on or what she was doing. This happens all day on and off.   She is going to see her PCP in the morning and will discuss this with them as well.   I advised her however that if she feels that she is losing consciousness then she needs to proceed to the ED. She states that she is unsure if it is that or if she is sleeping.   I will route to Dr. Servando Salina to advise further.

## 2020-11-16 NOTE — Telephone Encounter (Signed)
Please let her know if she is losing consciousness definitely she needs to go to the emergency department.  We also set her up for sleep study does she have a date for the sleep study yet to be able to rule out sleep apnea as a source of her daytime sleepiness-please asked the patient?

## 2020-11-17 DIAGNOSIS — R55 Syncope and collapse: Secondary | ICD-10-CM | POA: Insufficient documentation

## 2020-11-17 DIAGNOSIS — I110 Hypertensive heart disease with heart failure: Secondary | ICD-10-CM | POA: Insufficient documentation

## 2020-11-20 DIAGNOSIS — R400 Somnolence: Secondary | ICD-10-CM | POA: Insufficient documentation

## 2020-11-21 DIAGNOSIS — I951 Orthostatic hypotension: Secondary | ICD-10-CM | POA: Insufficient documentation

## 2020-11-21 DIAGNOSIS — E860 Dehydration: Secondary | ICD-10-CM | POA: Insufficient documentation

## 2020-11-26 ENCOUNTER — Telehealth: Payer: Self-pay | Admitting: Cardiology

## 2020-11-26 MED ORDER — TORSEMIDE 20 MG PO TABS: 10.0000 mg | ORAL_TABLET | Freq: Every day | ORAL | 3 refills | Status: DC

## 2020-11-26 NOTE — Telephone Encounter (Signed)
*  STAT* If patient is at the pharmacy, call can be transferred to refill team.   1. Which medications need to be refilled? (please list name of each medication and dose if known) torsemide   2. Which pharmacy/location (including street and city if local pharmacy) is medication to be sent to? [facility]  3. Do they need a 30 day or 90 day supply? 30

## 2020-11-28 ENCOUNTER — Telehealth: Payer: Self-pay | Admitting: Gastroenterology

## 2020-11-28 NOTE — Telephone Encounter (Signed)
Good morning Dr. Lavon Paganini, we received a referral and records for this patient to be seen for GERD.  She has been to a GI specialist within past year.  Will be sending records to you.  Can you please review and advise on scheduling?  Thank you.

## 2020-12-04 NOTE — Telephone Encounter (Signed)
Ok to schedule next available appt Thanks 

## 2020-12-05 ENCOUNTER — Encounter: Payer: Self-pay | Admitting: Gastroenterology

## 2020-12-05 NOTE — Telephone Encounter (Signed)
Patient scheduled for 01/14/2021.

## 2020-12-07 ENCOUNTER — Ambulatory Visit: Payer: Medicare Other | Admitting: Cardiology

## 2020-12-14 ENCOUNTER — Telehealth: Payer: Self-pay | Admitting: Cardiology

## 2020-12-14 NOTE — Telephone Encounter (Signed)
I would prefer she get the echo done here in our office.  We can reschedule her and I am okay if she wants to push her visit back.

## 2020-12-14 NOTE — Telephone Encounter (Signed)
Called patient to reschedule Echo on 12/17/20 due to office closing for weather. She stated she needs her Echo completed before her next appt with Dr. Servando Salina on 12/27/20. She requested to have the Echo completed at [facility]. If this is okay please complete RH order form and I will call to schedule.

## 2020-12-17 ENCOUNTER — Other Ambulatory Visit: Payer: Medicare Other

## 2020-12-18 ENCOUNTER — Telehealth: Payer: Self-pay | Admitting: Cardiology

## 2020-12-18 NOTE — Telephone Encounter (Signed)
     Pt would like to request to send echo order to [facility]. She wanted to get her echo before seeing Dr. Servando Salina on 01/27

## 2020-12-18 NOTE — Telephone Encounter (Signed)
Order has been placed for Split Night Sent to precert Awaiting authorization

## 2020-12-19 NOTE — Telephone Encounter (Signed)
Please let the patient know that it would be best for her to reschedule her echo in the office.

## 2020-12-19 NOTE — Telephone Encounter (Signed)
Called patient. Informed her that Dr. Servando Salina would like her to schedule echo at our office. Will have scheduler call patient.

## 2020-12-25 ENCOUNTER — Other Ambulatory Visit: Payer: Self-pay

## 2020-12-25 MED ORDER — CARVEDILOL 6.25 MG PO TABS: 6.2500 mg | ORAL_TABLET | Freq: Two times a day (BID) | ORAL | 2 refills | Status: DC

## 2020-12-25 NOTE — Telephone Encounter (Signed)
Refill of Carvedilol 6.25 mg sent to [facility].

## 2020-12-26 ENCOUNTER — Emergency Department (HOSPITAL_COMMUNITY): Payer: Medicare Other

## 2020-12-26 ENCOUNTER — Inpatient Hospital Stay (HOSPITAL_COMMUNITY)
Admission: EM | Admit: 2020-12-26 | Discharge: 2021-01-01 | DRG: 227 | Disposition: A | Discharge status: Home / Self Care | Payer: Medicare Other | Attending: Internal Medicine | Admitting: Internal Medicine

## 2020-12-26 ENCOUNTER — Other Ambulatory Visit: Payer: Self-pay

## 2020-12-26 ENCOUNTER — Telehealth: Payer: Self-pay

## 2020-12-26 ENCOUNTER — Encounter (HOSPITAL_COMMUNITY): Payer: Self-pay | Admitting: Emergency Medicine

## 2020-12-26 DIAGNOSIS — R931 Abnormal findings on diagnostic imaging of heart and coronary circulation: Secondary | ICD-10-CM | POA: Diagnosis present

## 2020-12-26 DIAGNOSIS — Z83511 Family history of glaucoma: Secondary | ICD-10-CM

## 2020-12-26 DIAGNOSIS — F419 Anxiety disorder, unspecified: Secondary | ICD-10-CM | POA: Diagnosis present

## 2020-12-26 DIAGNOSIS — Z7982 Long term (current) use of aspirin: Secondary | ICD-10-CM

## 2020-12-26 DIAGNOSIS — R55 Syncope and collapse: Secondary | ICD-10-CM | POA: Diagnosis present

## 2020-12-26 DIAGNOSIS — Z833 Family history of diabetes mellitus: Secondary | ICD-10-CM

## 2020-12-26 DIAGNOSIS — Z888 Allergy status to other drugs, medicaments and biological substances status: Secondary | ICD-10-CM

## 2020-12-26 DIAGNOSIS — Z91030 Bee allergy status: Secondary | ICD-10-CM

## 2020-12-26 DIAGNOSIS — I10 Essential (primary) hypertension: Secondary | ICD-10-CM | POA: Diagnosis present

## 2020-12-26 DIAGNOSIS — Z8249 Family history of ischemic heart disease and other diseases of the circulatory system: Secondary | ICD-10-CM

## 2020-12-26 DIAGNOSIS — N1831 Chronic kidney disease, stage 3a: Secondary | ICD-10-CM | POA: Diagnosis present

## 2020-12-26 DIAGNOSIS — I130 Hypertensive heart and chronic kidney disease with heart failure and stage 1 through stage 4 chronic kidney disease, or unspecified chronic kidney disease: Secondary | ICD-10-CM | POA: Diagnosis not present

## 2020-12-26 DIAGNOSIS — D649 Anemia, unspecified: Secondary | ICD-10-CM | POA: Diagnosis present

## 2020-12-26 DIAGNOSIS — Z90710 Acquired absence of both cervix and uterus: Secondary | ICD-10-CM

## 2020-12-26 DIAGNOSIS — I5042 Chronic combined systolic (congestive) and diastolic (congestive) heart failure: Secondary | ICD-10-CM | POA: Diagnosis present

## 2020-12-26 DIAGNOSIS — E782 Mixed hyperlipidemia: Secondary | ICD-10-CM | POA: Diagnosis present

## 2020-12-26 DIAGNOSIS — I2510 Atherosclerotic heart disease of native coronary artery without angina pectoris: Secondary | ICD-10-CM | POA: Diagnosis present

## 2020-12-26 DIAGNOSIS — M5030 Other cervical disc degeneration, unspecified cervical region: Secondary | ICD-10-CM | POA: Diagnosis present

## 2020-12-26 DIAGNOSIS — Z794 Long term (current) use of insulin: Secondary | ICD-10-CM

## 2020-12-26 DIAGNOSIS — R0989 Other specified symptoms and signs involving the circulatory and respiratory systems: Secondary | ICD-10-CM | POA: Diagnosis present

## 2020-12-26 DIAGNOSIS — K5790 Diverticulosis of intestine, part unspecified, without perforation or abscess without bleeding: Secondary | ICD-10-CM | POA: Diagnosis present

## 2020-12-26 DIAGNOSIS — F331 Major depressive disorder, recurrent, moderate: Secondary | ICD-10-CM | POA: Diagnosis present

## 2020-12-26 DIAGNOSIS — J449 Chronic obstructive pulmonary disease, unspecified: Secondary | ICD-10-CM | POA: Diagnosis present

## 2020-12-26 DIAGNOSIS — R0600 Dyspnea, unspecified: Secondary | ICD-10-CM

## 2020-12-26 DIAGNOSIS — Z955 Presence of coronary angioplasty implant and graft: Secondary | ICD-10-CM

## 2020-12-26 DIAGNOSIS — E119 Type 2 diabetes mellitus without complications: Secondary | ICD-10-CM

## 2020-12-26 DIAGNOSIS — Z862 Personal history of diseases of the blood and blood-forming organs and certain disorders involving the immune mechanism: Secondary | ICD-10-CM

## 2020-12-26 DIAGNOSIS — M797 Fibromyalgia: Secondary | ICD-10-CM | POA: Diagnosis present

## 2020-12-26 DIAGNOSIS — I447 Left bundle-branch block, unspecified: Secondary | ICD-10-CM | POA: Diagnosis present

## 2020-12-26 DIAGNOSIS — K219 Gastro-esophageal reflux disease without esophagitis: Secondary | ICD-10-CM | POA: Diagnosis present

## 2020-12-26 DIAGNOSIS — Z880 Allergy status to penicillin: Secondary | ICD-10-CM

## 2020-12-26 DIAGNOSIS — I252 Old myocardial infarction: Secondary | ICD-10-CM

## 2020-12-26 DIAGNOSIS — N1832 Chronic kidney disease, stage 3b: Secondary | ICD-10-CM

## 2020-12-26 DIAGNOSIS — G894 Chronic pain syndrome: Secondary | ICD-10-CM | POA: Diagnosis present

## 2020-12-26 DIAGNOSIS — M5442 Lumbago with sciatica, left side: Secondary | ICD-10-CM | POA: Diagnosis present

## 2020-12-26 DIAGNOSIS — Z95810 Presence of automatic (implantable) cardiac defibrillator: Secondary | ICD-10-CM

## 2020-12-26 DIAGNOSIS — F32A Depression, unspecified: Secondary | ICD-10-CM | POA: Diagnosis present

## 2020-12-26 DIAGNOSIS — G8929 Other chronic pain: Secondary | ICD-10-CM | POA: Diagnosis present

## 2020-12-26 DIAGNOSIS — E860 Dehydration: Secondary | ICD-10-CM | POA: Diagnosis present

## 2020-12-26 DIAGNOSIS — Z7952 Long term (current) use of systemic steroids: Secondary | ICD-10-CM

## 2020-12-26 DIAGNOSIS — Z7951 Long term (current) use of inhaled steroids: Secondary | ICD-10-CM

## 2020-12-26 DIAGNOSIS — D631 Anemia in chronic kidney disease: Secondary | ICD-10-CM | POA: Diagnosis present

## 2020-12-26 DIAGNOSIS — Z87891 Personal history of nicotine dependence: Secondary | ICD-10-CM

## 2020-12-26 DIAGNOSIS — E11319 Type 2 diabetes mellitus with unspecified diabetic retinopathy without macular edema: Secondary | ICD-10-CM | POA: Diagnosis present

## 2020-12-26 DIAGNOSIS — I951 Orthostatic hypotension: Secondary | ICD-10-CM | POA: Diagnosis present

## 2020-12-26 DIAGNOSIS — I739 Peripheral vascular disease, unspecified: Secondary | ICD-10-CM | POA: Diagnosis present

## 2020-12-26 DIAGNOSIS — Z79899 Other long term (current) drug therapy: Secondary | ICD-10-CM

## 2020-12-26 DIAGNOSIS — Z885 Allergy status to narcotic agent status: Secondary | ICD-10-CM

## 2020-12-26 DIAGNOSIS — G43909 Migraine, unspecified, not intractable, without status migrainosus: Secondary | ICD-10-CM | POA: Diagnosis present

## 2020-12-26 DIAGNOSIS — Z20822 Contact with and (suspected) exposure to covid-19: Secondary | ICD-10-CM | POA: Diagnosis present

## 2020-12-26 DIAGNOSIS — Z6831 Body mass index (BMI) 31.0-31.9, adult: Secondary | ICD-10-CM

## 2020-12-26 DIAGNOSIS — E1142 Type 2 diabetes mellitus with diabetic polyneuropathy: Secondary | ICD-10-CM | POA: Diagnosis present

## 2020-12-26 DIAGNOSIS — Z87892 Personal history of anaphylaxis: Secondary | ICD-10-CM

## 2020-12-26 DIAGNOSIS — H409 Unspecified glaucoma: Secondary | ICD-10-CM | POA: Diagnosis present

## 2020-12-26 DIAGNOSIS — M5441 Lumbago with sciatica, right side: Secondary | ICD-10-CM | POA: Diagnosis present

## 2020-12-26 DIAGNOSIS — I110 Hypertensive heart disease with heart failure: Secondary | ICD-10-CM | POA: Diagnosis present

## 2020-12-26 DIAGNOSIS — E1151 Type 2 diabetes mellitus with diabetic peripheral angiopathy without gangrene: Secondary | ICD-10-CM | POA: Diagnosis present

## 2020-12-26 DIAGNOSIS — E1122 Type 2 diabetes mellitus with diabetic chronic kidney disease: Secondary | ICD-10-CM

## 2020-12-26 DIAGNOSIS — I255 Ischemic cardiomyopathy: Secondary | ICD-10-CM | POA: Diagnosis present

## 2020-12-26 LAB — CBC
HCT: 38 % (ref 36.0–46.0)
Hemoglobin: 11.6 g/dL — ABNORMAL LOW (ref 12.0–15.0)
MCH: 27.4 pg (ref 26.0–34.0)
MCHC: 30.5 g/dL (ref 30.0–36.0)
MCV: 89.8 fL (ref 80.0–100.0)
Platelets: 345 10*3/uL (ref 150–400)
RBC: 4.23 MIL/uL (ref 3.87–5.11)
RDW: 15.8 % — ABNORMAL HIGH (ref 11.5–15.5)
WBC: 7.2 10*3/uL (ref 4.0–10.5)
nRBC: 0 % (ref 0.0–0.2)

## 2020-12-26 LAB — URINALYSIS, ROUTINE W REFLEX MICROSCOPIC
Bilirubin Urine: NEGATIVE
Glucose, UA: >=500 mg/dL — AB
Ketones, ur: NEGATIVE mg/dL
Leukocytes,Ua: NEGATIVE
Nitrite: NEGATIVE
Protein, ur: NEGATIVE mg/dL
Specific Gravity, Urine: 1.013 (ref 1.005–1.030)
pH: 5 (ref 5.0–8.0)

## 2020-12-26 LAB — CBG MONITORING, ED: Glucose-Capillary: 75 mg/dL (ref 70–99)

## 2020-12-26 LAB — BASIC METABOLIC PANEL
Anion gap: 10 (ref 5–15)
BUN: 18 mg/dL (ref 8–23)
CO2: 22 mmol/L (ref 22–32)
Calcium: 9.1 mg/dL (ref 8.9–10.3)
Chloride: 109 mmol/L (ref 98–111)
Creatinine, Ser: 1.43 mg/dL — ABNORMAL HIGH (ref 0.44–1.00)
GFR, Estimated: 39 mL/min — ABNORMAL LOW (ref >60)
Glucose, Bld: 127 mg/dL — ABNORMAL HIGH (ref 70–99)
Potassium: 3.8 mmol/L (ref 3.5–5.1)
Sodium: 141 mmol/L (ref 135–145)

## 2020-12-26 LAB — TROPONIN I (HIGH SENSITIVITY): Troponin I (High Sensitivity): 14 ng/L (ref <18)

## 2020-12-26 LAB — SARS CORONAVIRUS 2 BY RT PCR (HOSPITAL ORDER, PERFORMED IN ~~LOC~~ HOSPITAL LAB): SARS Coronavirus 2: NEGATIVE

## 2020-12-26 LAB — MAGNESIUM: Magnesium: 2.3 mg/dL (ref 1.7–2.4)

## 2020-12-26 MED ORDER — ALBUTEROL SULFATE HFA 108 (90 BASE) MCG/ACT IN AERS
2.0000 | INHALATION_SPRAY | Freq: Four times a day (QID) | RESPIRATORY_TRACT | Status: DC | PRN
  Filled 2020-12-26: qty 6.7

## 2020-12-26 MED ORDER — IPRATROPIUM-ALBUTEROL 20-100 MCG/ACT IN AERS
1.0000 | INHALATION_SPRAY | Freq: Two times a day (BID) | RESPIRATORY_TRACT | Status: DC
  Administered 2020-12-27 – 2021-01-01 (×9): 1 via RESPIRATORY_TRACT
  Filled 2020-12-26: qty 4

## 2020-12-26 MED ORDER — TICAGRELOR 90 MG PO TABS
90.0000 mg | ORAL_TABLET | Freq: Two times a day (BID) | ORAL | Status: DC
  Administered 2020-12-27 – 2020-12-28 (×3): 90 mg via ORAL
  Filled 2020-12-26 (×3): qty 1

## 2020-12-26 MED ORDER — INSULIN ASPART 100 UNIT/ML ~~LOC~~ SOLN
0.0000 [IU] | Freq: Three times a day (TID) | SUBCUTANEOUS | Status: DC
  Administered 2020-12-27 – 2020-12-28 (×2): 3 [IU] via SUBCUTANEOUS
  Administered 2020-12-29: 2 [IU] via SUBCUTANEOUS
  Administered 2020-12-29: 3 [IU] via SUBCUTANEOUS
  Administered 2020-12-29: 2 [IU] via SUBCUTANEOUS
  Administered 2020-12-30 (×3): 3 [IU] via SUBCUTANEOUS
  Administered 2020-12-31: 2 [IU] via SUBCUTANEOUS
  Administered 2020-12-31: 3 [IU] via SUBCUTANEOUS
  Administered 2021-01-01: 5 [IU] via SUBCUTANEOUS
  Administered 2021-01-01: 8 [IU] via SUBCUTANEOUS

## 2020-12-26 MED ORDER — ASPIRIN EC 81 MG PO TBEC
81.0000 mg | DELAYED_RELEASE_TABLET | Freq: Every day | ORAL | Status: DC
  Administered 2020-12-27 – 2021-01-01 (×6): 81 mg via ORAL
  Filled 2020-12-26 (×6): qty 1

## 2020-12-26 MED ORDER — FOLIC ACID 1 MG PO TABS
1.0000 mg | ORAL_TABLET | Freq: Every day | ORAL | Status: DC
Start: 2020-12-27 — End: 2021-01-01
  Administered 2020-12-27 – 2021-01-01 (×6): 1 mg via ORAL
  Filled 2020-12-26 (×6): qty 1

## 2020-12-26 MED ORDER — SACUBITRIL-VALSARTAN 24-26 MG PO TABS
1.0000 | ORAL_TABLET | Freq: Two times a day (BID) | ORAL | Status: DC
  Administered 2020-12-27 – 2020-12-28 (×3): 1 via ORAL
  Filled 2020-12-26 (×5): qty 1

## 2020-12-26 MED ORDER — ROSUVASTATIN CALCIUM 20 MG PO TABS
40.0000 mg | ORAL_TABLET | Freq: Every day | ORAL | Status: DC
Start: 2020-12-27 — End: 2021-01-01
  Administered 2020-12-27 – 2021-01-01 (×6): 40 mg via ORAL
  Filled 2020-12-26 (×4): qty 2
  Filled 2020-12-26: qty 8
  Filled 2020-12-26: qty 2

## 2020-12-26 MED ORDER — ENOXAPARIN SODIUM 40 MG/0.4ML ~~LOC~~ SOLN
40.0000 mg | Freq: Every day | SUBCUTANEOUS | Status: DC
  Administered 2020-12-27 – 2020-12-29 (×3): 40 mg via SUBCUTANEOUS
  Filled 2020-12-26 (×4): qty 0.4

## 2020-12-26 MED ORDER — ACETAMINOPHEN 325 MG PO TABS
650.0000 mg | ORAL_TABLET | Freq: Four times a day (QID) | ORAL | Status: DC | PRN
  Administered 2020-12-28 – 2020-12-30 (×3): 650 mg via ORAL
  Filled 2020-12-26 (×5): qty 2

## 2020-12-26 MED ORDER — PREGABALIN 25 MG PO CAPS
50.0000 mg | ORAL_CAPSULE | Freq: Three times a day (TID) | ORAL | Status: DC
  Administered 2020-12-27 – 2021-01-01 (×18): 50 mg via ORAL
  Filled 2020-12-26 (×18): qty 2

## 2020-12-26 MED ORDER — POLYETHYLENE GLYCOL 3350 17 G PO PACK: 17.0000 g | PACK | Freq: Every day | ORAL | Status: DC | PRN

## 2020-12-26 MED ORDER — POTASSIUM CHLORIDE ER 10 MEQ PO TBCR
10.0000 meq | EXTENDED_RELEASE_TABLET | Freq: Every day | ORAL | Status: DC
  Administered 2020-12-27 – 2020-12-28 (×2): 10 meq via ORAL
  Filled 2020-12-26 (×4): qty 1

## 2020-12-26 MED ORDER — VITAMIN B-12 1000 MCG PO TABS
1000.0000 ug | ORAL_TABLET | Freq: Every day | ORAL | Status: DC
  Administered 2020-12-27 – 2021-01-01 (×6): 1000 ug via ORAL
  Filled 2020-12-26 (×6): qty 1

## 2020-12-26 MED ORDER — ACETAMINOPHEN 650 MG RE SUPP: 650.0000 mg | Freq: Four times a day (QID) | RECTAL | Status: DC | PRN

## 2020-12-26 MED ORDER — DICYCLOMINE HCL 10 MG PO CAPS
10.0000 mg | ORAL_CAPSULE | Freq: Four times a day (QID) | ORAL | Status: DC | PRN
  Administered 2020-12-27 – 2021-01-01 (×4): 10 mg via ORAL
  Filled 2020-12-26 (×4): qty 1

## 2020-12-26 MED ORDER — SPIRONOLACTONE 12.5 MG HALF TABLET
12.5000 mg | ORAL_TABLET | Freq: Every day | ORAL | Status: DC
  Administered 2020-12-27 – 2020-12-28 (×2): 12.5 mg via ORAL
  Filled 2020-12-26 (×2): qty 1

## 2020-12-26 MED ORDER — MAGNESIUM OXIDE 400 (241.3 MG) MG PO TABS
400.0000 mg | ORAL_TABLET | Freq: Every day | ORAL | Status: DC
Start: 2020-12-27 — End: 2021-01-01
  Administered 2020-12-27 – 2021-01-01 (×6): 400 mg via ORAL
  Filled 2020-12-26 (×6): qty 1

## 2020-12-26 MED ORDER — FERROUS SULFATE 325 (65 FE) MG PO TABS
324.0000 mg | ORAL_TABLET | Freq: Every day | ORAL | Status: DC
  Administered 2020-12-27 – 2020-12-31 (×5): 324 mg via ORAL
  Filled 2020-12-26 (×6): qty 1

## 2020-12-26 MED ORDER — BUDESONIDE 0.5 MG/2ML IN SUSP
1.0000 mg | Freq: Two times a day (BID) | RESPIRATORY_TRACT | Status: DC | PRN
  Filled 2020-12-26: qty 4

## 2020-12-26 MED ORDER — VORTIOXETINE HBR 5 MG PO TABS
5.0000 mg | ORAL_TABLET | Freq: Every day | ORAL | Status: DC
  Administered 2020-12-27 – 2020-12-31 (×5): 5 mg via ORAL
  Filled 2020-12-26 (×7): qty 1

## 2020-12-26 MED ORDER — MOMETASONE FURO-FORMOTEROL FUM 100-5 MCG/ACT IN AERO: 2.0000 | INHALATION_SPRAY | Freq: Two times a day (BID) | RESPIRATORY_TRACT | Status: DC

## 2020-12-26 MED ORDER — PANTOPRAZOLE SODIUM 40 MG PO TBEC
40.0000 mg | DELAYED_RELEASE_TABLET | Freq: Every day | ORAL | Status: DC
  Administered 2020-12-27 – 2021-01-01 (×6): 40 mg via ORAL
  Filled 2020-12-26 (×6): qty 1

## 2020-12-26 MED ORDER — UMECLIDINIUM BROMIDE 62.5 MCG/INH IN AEPB
1.0000 | INHALATION_SPRAY | Freq: Every day | RESPIRATORY_TRACT | Status: DC
  Administered 2020-12-27 – 2021-01-01 (×4): 1 via RESPIRATORY_TRACT
  Filled 2020-12-26 (×2): qty 7

## 2020-12-26 MED ORDER — SODIUM CHLORIDE 0.9% FLUSH
3.0000 mL | Freq: Two times a day (BID) | INTRAVENOUS | Status: DC
  Administered 2020-12-27 – 2021-01-01 (×9): 3 mL via INTRAVENOUS

## 2020-12-26 MED ORDER — CARVEDILOL 6.25 MG PO TABS
6.2500 mg | ORAL_TABLET | Freq: Two times a day (BID) | ORAL | Status: DC
  Administered 2020-12-27 (×2): 6.25 mg via ORAL
  Filled 2020-12-26: qty 2
  Filled 2020-12-26 (×2): qty 1

## 2020-12-26 MED ORDER — DULOXETINE HCL 30 MG PO CPEP: 30.0000 mg | ORAL_CAPSULE | Freq: Every day | ORAL | Status: DC

## 2020-12-26 MED ORDER — TORSEMIDE 10 MG PO TABS
10.0000 mg | ORAL_TABLET | Freq: Every day | ORAL | Status: DC
  Administered 2020-12-27 – 2020-12-28 (×2): 10 mg via ORAL
  Filled 2020-12-26 (×2): qty 1

## 2020-12-26 MED ORDER — ISOSORBIDE MONONITRATE ER 60 MG PO TB24
60.0000 mg | ORAL_TABLET | Freq: Every day | ORAL | Status: DC
  Administered 2020-12-27: 60 mg via ORAL
  Filled 2020-12-26: qty 2
  Filled 2020-12-26: qty 1

## 2020-12-26 MED ORDER — ARFORMOTEROL TARTRATE 15 MCG/2ML IN NEBU
15.0000 ug | INHALATION_SOLUTION | Freq: Two times a day (BID) | RESPIRATORY_TRACT | Status: DC
  Administered 2020-12-28 – 2021-01-01 (×7): 15 ug via RESPIRATORY_TRACT
  Filled 2020-12-26 (×15): qty 2

## 2020-12-26 MED ORDER — NITROGLYCERIN 0.4 MG SL SUBL
0.4000 mg | SUBLINGUAL_TABLET | SUBLINGUAL | Status: DC | PRN
  Administered 2020-12-29 – 2020-12-31 (×3): 0.4 mg via SUBLINGUAL
  Filled 2020-12-26 (×3): qty 1

## 2020-12-26 MED ORDER — INSULIN GLARGINE 100 UNIT/ML ~~LOC~~ SOLN
25.0000 [IU] | Freq: Two times a day (BID) | SUBCUTANEOUS | Status: DC
  Administered 2020-12-27 – 2021-01-01 (×11): 25 [IU] via SUBCUTANEOUS
  Filled 2020-12-26 (×13): qty 0.25

## 2020-12-26 MED ORDER — FUROSEMIDE 20 MG PO TABS: 40.0000 mg | ORAL_TABLET | Freq: Every day | ORAL | Status: DC

## 2020-12-26 NOTE — ED Provider Notes (Signed)
[facility] EMERGENCY DEPARTMENT Provider Note   CSN: [id number] Arrival date & time: 12/26/20  1645     History Chief Complaint  Patient presents with  . Loss of Consciousness    Karen Warner is a 70 y.o. female.  Patient presents to ER chief complaint of 3 syncopal episodes today.  She states that she was eating lunch when she woke up with her head on her table.  She does not know how that happened.  Denies headache or chest pain or abdominal pain.  This happened 2 additional times when she was eating.  Denies falling to the ground.  She states she had chest pain about 4 days ago but no recent chest pain.  Denies fevers vomiting cough or diarrhea otherwise.        Past Medical History:  Diagnosis Date  . Arthralgia 01/14/2020  . Atherosclerotic heart disease of native coronary artery without angina pectoris 04/13/2014  . CHF (congestive heart failure) (HCC)   . Chronic bilateral low back pain with bilateral sciatica 01/13/2019  . Chronic bladder pain 11/08/2014   Last Assessment & Plan:  Formatting of this note might be different from the original. Patient has chronic pain syndrome in general I think this is unfortunately worsening her pelvic pain and bladder pain her examination today was very reassuring I am advised her to use the estrogen cream I did start her on Elavil 10 milligrams at that time if she does tolerated will increase it to 25 milligrams.   . Chronic idiopathic constipation 12/22/2014   Formatting of this note might be different from the original. Last Assessment & Plan:  For better bowel emptying please use either Citrucel / Benefiber start with 2 tablespoons daily and titrate up or down to effect.  Also please use glycerin suppositories as needed to assist in evacuation. Last Assessment & Plan:  Formatting of this note might be different from the original. For better bowel empt  . Chronic obstructive pulmonary disease, unspecified (HCC)  03/18/2018  . Class 1 obesity due to excess calories with serious comorbidity and body mass index (BMI) of 31.0 to 31.9 in adult 06/26/2020  . Coronary artery disease   . DDD (degenerative disc disease), cervical 01/13/2019  . Depression 11/05/2019  . Diabetic polyneuropathy associated with type 2 diabetes mellitus (HCC) 02/24/2019  . Diverticulitis 05/20/2018  . Diverticulitis of colon 03/18/2018  . Family history of ischemic heart disease (IHD) 08/16/2013  . Fibromyalgia affecting multiple sites 01/14/2020  . Gastro-esophageal reflux disease without esophagitis 01/13/2019  . Glaucoma 11/08/2014  . History of anemia 12/12/2019  . Hordeolum externum of left upper eyelid 03/13/2020  . Hypertension 11/08/2014  . Iron deficiency anemia 01/13/2019  . Left renal mass 11/08/2019  . Leukocytosis 05/28/2018  . Low vitamin B12 level 03/13/2020  . Low vitamin D level 12/12/2019  . LV dysfunction 05/31/2019  . Malignant essential hypertension 01/22/2016  . Migraine, unspecified, not intractable, without status migrainosus 11/08/2014  . Mixed hyperlipidemia 01/13/2019  . Myocardial infarction (HCC)   . Other premature beats 11/08/2014  . 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 PVD (peripheral vascular disease) (HCC) 04/08/2017  . 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 Trochanteric bursitis of right hip 04/20/2019  . Type 2 diabetes mellitus without complications (HCC) 12/08/2013  . Vaginal atrophy 11/08/2014   Formatting of this note might be different from the original. Last Assessment & Plan:  For vaginal atrophy please place  a pea size dab of Estrogen vaginal cream  into the vagina 3 times a week ( Monday, Wednesday, Friday) Last Assessment & Plan:  Formatting of this note might be different from the original. For vaginal atrophy please place a pea size dab of Estrogen vaginal cream  into the vagina     Patient Active Problem List   Diagnosis Date Noted  . 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 Orthostatic hypotension 11/21/2020  . Daytime sleepiness 11/20/2020  . Hypertensive heart disease with heart failure (HCC) 11/17/2020  . Syncope 11/17/2020  . Herpes simplex vulvovaginitis 11/02/2020  . Snoring 10/17/2020  . Depressed left ventricular ejection fraction 10/16/2020  . Left bundle branch block 10/16/2020  . Nasal congestion 09/30/2020  . Coronary artery disease   . Myocardial infarction (HCC)   . Unstable angina (HCC) 08/03/2020  . Ischemic cardiomyopathy 08/03/2020  . Non-ST elevation (NSTEMI) myocardial infarction (HCC) 08/03/2020  . Class 1 obesity due to excess calories with serious comorbidity and body mass index (BMI) of 31.0 to 31.9 in adult 06/26/2020  . Pustular rash 05/14/2020  . Torticollis, acute 04/11/2020  . Hordeolum externum of left upper eyelid 03/13/2020  . Low vitamin B12 level 03/13/2020  . Pain of right great toe 01/24/2020  . Arthralgia 01/14/2020  . Fibromyalgia affecting multiple sites 01/14/2020  . Diarrhea of presumed infectious origin 01/07/2020  . Ingrown nail 12/27/2019  . History of anemia 12/12/2019  . Low hemoglobin 12/12/2019  . Low vitamin D level 12/12/2019  . Long-term use of aspirin therapy 11/15/2019  . Left renal mass 11/08/2019  . Depression 11/05/2019  . Neuropathy 11/05/2019  . Other fatigue 09/12/2019  . Rhinosinusitis 09/12/2019  . Hypertensive emergency 08/06/2019  . Left ventricular dysfunction 05/31/2019  . LV dysfunction 05/31/2019  . Trochanteric bursitis of right hip 04/20/2019  . Aftercare following surgery 04/12/2019  . Injury of toenail of left foot 04/06/2019  . Moderate episode of recurrent major depressive disorder (HCC) 03/29/2019  . Diabetic polyneuropathy associated with type 2 diabetes mellitus (HCC) 02/24/2019  . History of disseminated herpes simplex 02/22/2019  . 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  Seasonal allergies 02/22/2019  . Encounter to establish care 01/23/2019  . Vitamin D deficiency 01/23/2019  . Chronic bilateral low back pain with bilateral sciatica 01/13/2019  . DDD (degenerative disc disease), cervical 01/13/2019  . Gastro-esophageal reflux disease without esophagitis 01/13/2019  . History of intestinal surgery 01/13/2019  . Iron deficiency anemia 01/13/2019  . Mixed hyperlipidemia 01/13/2019  . Tobacco abuse 01/13/2019  . Leukocytosis 05/28/2018  . Diverticulitis 05/20/2018  . Chronic obstructive pulmonary disease, unspecified (HCC) 03/18/2018  . Diverticulitis of colon 03/18/2018  . PVD (peripheral vascular disease) (HCC) 04/08/2017  . Chest pain 04/07/2017  . Malignant essential hypertension 01/22/2016  . Lower abdominal pain 12/22/2014  . Chronic idiopathic constipation 12/22/2014  . Chronic bladder pain 11/08/2014  . Glaucoma 11/08/2014  . H/O vaginal hysterectomy 11/08/2014  . Hypertension 11/08/2014  . Migraine, unspecified, not intractable, without status migrainosus 11/08/2014  . Nocturia 11/08/2014  . Other premature beats 11/08/2014  . Urge incontinence 11/08/2014  . Urinary urgency 11/08/2014  . Vaginal atrophy 11/08/2014  . Atherosclerotic heart disease of native coronary artery without angina pectoris 04/13/2014  . Type 2 diabetes mellitus without complications (HCC) 12/08/2013  . Family history of ischemic heart disease (IHD) 08/16/2013  . Ptosis 04/19/2012    Past Surgical History:  Procedure  Laterality Date  . ABDOMINAL HYSTERECTOMY    . BLEPHAROPLASTY Bilateral   . COLON SURGERY    . CORONARY ANGIOPLASTY WITH STENT PLACEMENT  2020   X3  . CORONARY STENT INTERVENTION N/A 08/03/2020   Procedure: CORONARY STENT INTERVENTION;  Surgeon: Tonny Bollman, MD;  Location: [facility] INVASIVE CV LAB;  Service: Cardiovascular;  Laterality: N/A;  . INTRAVASCULAR PRESSURE WIRE/FFR STUDY N/A 08/03/2020   Procedure: INTRAVASCULAR PRESSURE WIRE/FFR STUDY;   Surgeon: Tonny Bollman, MD;  Location: [facility] INVASIVE CV LAB;  Service: Cardiovascular;  Laterality: N/A;  . LEFT HEART CATH AND CORONARY ANGIOGRAPHY N/A 08/03/2020   Procedure: LEFT HEART CATH AND CORONARY ANGIOGRAPHY;  Surgeon: Tonny Bollman, MD;  Location: [facility] INVASIVE CV LAB;  Service: Cardiovascular;  Laterality: N/A;  . REFRACTIVE SURGERY Left    [place]     OB History   No obstetric history on file.     Family History  Problem Relation Age of Onset  . Lung cancer Mother   . Coronary artery disease Mother   . Diabetes Mother   . Glaucoma Mother   . Hypertension Mother   . Migraines Father   . Diabetes Maternal Grandmother   . Diabetes Daughter   . Migraines Son     Social History   Tobacco Use  . Smoking status: Former Games developer  . Smokeless tobacco: Never Used  Vaping Use  . Vaping Use: Never used  Substance Use Topics  . Alcohol use: Never  . Drug use: Never    Home Medications Prior to Admission medications   Medication Sig Start Date End Date Taking? Authorizing Provider  acetaminophen (TYLENOL) 325 MG tablet Take 2 tablets by mouth every 6 (six) hours as needed. 08/15/19   [provider]  ACETAMINOPHEN-BUTALBITAL 50-325 MG TABS Take 1 tablet by mouth every 4 (four) hours as needed.    [provider]  albuterol (PROVENTIL) (2.5 MG/3ML) 0.083% nebulizer solution INL CONTENTS OF 1 VIAL VIA NEBULIZER Q 4 H PRF WHEEZING OR COUGH 01/26/18   [provider]  albuterol (VENTOLIN HFA) 108 (90 Base) MCG/ACT inhaler Inhale 2 puffs into the lungs every 6 (six) hours as needed. 01/26/18   [provider]  aspirin 81 MG EC tablet 81 mg daily.    [provider]  baclofen (LIORESAL) 10 MG tablet Take 10 mg by mouth daily.  06/29/17   [provider]  budesonide (PULMICORT) 1 MG/2ML nebulizer solution Take 1 mg by nebulization in the morning and at bedtime.    [provider]  carvedilol (COREG) 6.25 MG tablet Take 1  tablet (6.25 mg total) by mouth 2 (two) times daily with a meal. 12/25/20   Tobb, Kardie, DO  COMBIVENT RESPIMAT 20-100 MCG/ACT AERS respimat Inhale 1 puff into the lungs 2 (two) times daily. 12/21/20   [provider]  dapagliflozin propanediol (FARXIGA) 10 MG TABS tablet Take 1 tablet (10 mg total) by mouth daily before breakfast. 11/12/20   Tobb, Kardie, DO  diclofenac Sodium (VOLTAREN) 1 % GEL Apply 2 g topically 2 (two) times daily as needed. 06/29/20   [provider]  dicyclomine (BENTYL) 10 MG capsule Take 10 mg by mouth every 6 (six) hours as needed for spasms.    [provider]  DULoxetine (CYMBALTA) 30 MG capsule Take 30 mg by mouth daily. 12/25/20   [provider]  ENTRESTO 24-26 MG TAKE 1 TABLET BY MOUTH TWICE DAILY 10/04/20   Tobb, Kardie, DO  esomeprazole (NEXIUM) 40 MG  capsule Take 40 mg by mouth daily at 12 noon.    [provider]  famotidine (PEPCID) 40 MG tablet Take 40 mg by mouth at bedtime.    [provider]  ferrous sulfate 324 MG TBEC Take 324 mg by mouth daily with breakfast.    [provider]  fluticasone (FLONASE) 50 MCG/ACT nasal spray Place into both nostrils. 12/25/20   [provider]  folic acid (FOLVITE) 1 MG tablet Take 1 mg by mouth daily.    [provider]  furosemide (LASIX) 40 MG tablet Take 1 tablet (40 mg total) by mouth daily. 09/24/20   Tobb, Kardie, DO  insulin lispro (HUMALOG) 100 UNIT/ML KwikPen Inject 16 Units into the skin 3 (three) times daily. Administer 16 units three times daily before a meal per sliding scale 09/22/16   [provider]  isosorbide mononitrate (IMDUR) 60 MG 24 hr tablet Take 1 tablet (60 mg total) by mouth daily. 11/12/20 02/10/21  Tobb, Kardie, DO  latanoprost (XALATAN) 0.005 % ophthalmic solution SMARTSIG:In Eye(s) 12/25/20   [provider]  loratadine (CLARITIN) 10 MG tablet Take 10 mg by mouth daily. 09/19/20   [provider]  magnesium oxide (MAG-OX) 400 MG tablet Take by mouth. 12/11/20   [provider]  MAGNESIUM-OXIDE 400 (241.3 Mg) MG tablet Take 400 mg by mouth daily. 07/27/20   [provider]  nitroGLYCERIN (NITROSTAT) 0.4 MG SL tablet Place 1 tablet (0.4 mg total) under the tongue every 5 (five) minutes as needed for chest pain. 08/07/20   Tobb, Kardie, DO  nystatin (MYCOSTATIN) 100000 UNIT/ML suspension Take 5 mLs by mouth 4 (four) times daily. 10/20/20   [provider]  nystatin cream (MYCOSTATIN) Apply topically.    [provider]  potassium chloride (KLOR-CON) 10 MEQ tablet Take 10 mEq by mouth daily. 10/07/20   [provider]  predniSONE (DELTASONE) 5 MG tablet Take by mouth. 08/31/20   [provider]  pregabalin (LYRICA) 50 MG capsule Take 50 mg by mouth 3 (three) times daily.    [provider]  rosuvastatin (CRESTOR) 40 MG tablet Take 40 mg by mouth daily.    [provider]  SEREVENT DISKUS 50 MCG/DOSE diskus inhaler Inhale 1 puff into the lungs 2 (two) times daily. 12/22/20   [provider]  SPIRIVA RESPIMAT 2.5 MCG/ACT AERS SMARTSIG:2 Puff(s) Via Inhaler Daily 12/22/20   [provider]  spironolactone (ALDACTONE) 25 MG tablet Take 0.5 tablets (12.5 mg total) by mouth daily. 09/04/20   Tobb, [place], DO  SYMBICORT 80-4.5 MCG/ACT inhaler SMARTSIG:2 Puff(s) By Mouth Twice Daily 10/07/20   [provider]  ticagrelor (BRILINTA) 90 MG TABS tablet Take 1 tablet (90 mg total) by mouth in the morning and at bedtime. 09/04/20 09/04/21  Tobb, Kardie, DO  torsemide (DEMADEX) 20 MG tablet Take 0.5 tablets (10 mg total) by mouth daily. 11/26/20   Tobb, Kardie, DO  TRESIBA FLEXTOUCH 200 UNIT/ML FlexTouch Pen Inject 40 Units into the skin 2 (two) times daily. 06/29/20   [provider]  TRINTELLIX 5 MG TABS tablet Take 5 mg by mouth daily. 08/14/20   [provider]  valACYclovir (VALTREX) 1000 MG tablet  Take 1,000 mg by mouth 2 (two) times daily. 11/02/20   [provider]  vitamin B-12 (CYANOCOBALAMIN) 1000 MCG tablet Take 1,000 mcg by mouth daily.    [provider]  Vitamin D, Ergocalciferol, (DRISDOL) 1.25 MG (50000 UNIT) CAPS capsule Take 50,000 Units by  mouth once a week. 12/25/20   [provider]  WAL-DRYL ALLERGY 12.5 MG/5ML liquid Take 12.5 mg by mouth. 09/16/20   [provider]    Allergies    Bee venom, Sumatriptan, Tramadol, Amoxicillin-pot clavulanate, Oxycodone, Buprenorphine hcl, Clarithromycin, Duloxetine hcl, Hydrocodone, Lactose, Liraglutide, and Tramadol hcl  Review of Systems   Review of Systems  Constitutional: Negative for fever.  HENT: Negative for ear pain.   Eyes: Negative for pain.  Respiratory: Negative for cough.   Cardiovascular: Negative for chest pain.  Gastrointestinal: Negative for abdominal pain.  Genitourinary: Negative for flank pain.  Musculoskeletal: Negative for back pain.  Skin: Negative for rash.  Neurological: Positive for syncope. Negative for headaches.    Physical Exam Updated Vital Signs BP 130/78 (BP Location: Right Arm)   Pulse 81   Temp 98 F (36.7 C) (Oral)   Resp 16   SpO2 100%   Physical Exam Constitutional:      General: She is not in acute distress.    Appearance: Normal appearance.  HENT:     Head: Normocephalic.     Nose: Nose normal.  Eyes:     Extraocular Movements: Extraocular movements intact.  Cardiovascular:     Rate and Rhythm: Normal rate.  Pulmonary:     Effort: Pulmonary effort is normal.  Musculoskeletal:        General: Normal range of motion.     Cervical back: Normal range of motion.  Neurological:     General: No focal deficit present.     Mental Status: She is alert and oriented to person, place, and time. Mental status is at baseline.     Cranial Nerves: No cranial nerve deficit.     Motor: No weakness.     ED Results / Procedures / Treatments    Labs (all labs ordered are listed, but only abnormal results are displayed) Labs Reviewed  BASIC METABOLIC PANEL - Abnormal; Notable for the following components:      Result Value   Glucose, Bld 127 (*)    Creatinine, Ser 1.43 (*)    GFR, Estimated 39 (*)    All other components within normal limits  CBC - Abnormal; Notable for the following components:   Hemoglobin 11.6 (*)    RDW 15.8 (*)    All other components within normal limits  URINALYSIS, ROUTINE W REFLEX MICROSCOPIC - Abnormal; Notable for the following components:   APPearance HAZY (*)    Glucose, UA >=500 (*)    Hgb urine dipstick MODERATE (*)    Bacteria, UA RARE (*)    All other components within normal limits  SARS CORONAVIRUS 2 BY RT PCR (HOSPITAL ORDER, PERFORMED IN  HOSPITAL LAB)  CBG MONITORING, ED  TROPONIN I (HIGH SENSITIVITY)    EKG EKG Interpretation  Date/Time:  Wednesday December 26 2020 20:46:59 EST Ventricular Rate:  85 PR Interval:  152 QRS Duration: 157 QT Interval:  418 QTC Calculation: 498 R Axis:   72 Text Interpretation: Sinus rhythm Probable left atrial enlargement Left bundle branch block Confirmed by Norman Clay ([id number]) on 12/26/2020 8:49:27 PM   Radiology DG Chest 1 View  Result Date: 12/26/2020 CLINICAL DATA:  Chest pain EXAM: CHEST  1 VIEW COMPARISON:  12/23/2020 FINDINGS: Single frontal view of the chest demonstrates stable enlargement of the cardiac silhouette. No airspace disease, effusion, or pneumothorax. No acute bony abnormalities. IMPRESSION: 1. Stable cardiomegaly.  No acute process. Electronically Signed   By: Maxwell Caul.D.  On: 12/26/2020 21:05    Procedures Procedures   Medications Ordered in ED Medications - No data to display  ED Course  I have reviewed the triage vital signs and the nursing notes.  Pertinent labs & imaging results that were available during my care of the patient were reviewed by me and considered in my medical decision making  (see chart for details).    MDM Rules/Calculators/A&P                          Labs otherwise unremarkable.  Patient is 69 years old history of cardiac disease presenting with multiple simple episodes.  Placed on the monitor, admitted to the hospitalist team for syncope.   Final Clinical Impression(s) / ED Diagnoses Final diagnoses:  Syncope, unspecified syncope type    Rx / DC Orders ED Discharge Orders    None       Cheryll Cockayne, MD 12/26/20 2147

## 2020-12-26 NOTE — ED Triage Notes (Signed)
Patient states she was sitting at home at the table and then she woke up on the floor with food around her. Then later she was sitting at a computer and woke up with her head on the computer. Patient states her blood pressure has been low. Also reports shortness of breath.

## 2020-12-26 NOTE — Telephone Encounter (Signed)
Patient called office today. This nurse spoke with Dr. Servando Salina about patient's symptoms. Dr. Servando Salina advises for patient  to go to the ED. Spoke with patient. Patient verbalizes understanding and will keep appointment tomorrow if not admitted to the hospital. No further questions or concerns at this time.

## 2020-12-26 NOTE — ED Notes (Signed)
The pt has migraine headaches  She is c/o a strange feeling on the rt aide of her body for several days  A and o x4

## 2020-12-26 NOTE — ED Notes (Signed)
Then pt had a radid covid test 2-3 days ago at [facility] that was negative

## 2020-12-26 NOTE — H&P (Signed)
History and Physical   Karen Warner ZOX:[id number] DOB: 03-11-1951 DOA: 12/26/2020  PCP: Laurel Dimmer, FNP   Patient coming from: Home  Chief Complaint: Syncope  HPI: Karen Warner is a 70 y.o. female with medical history significant of since her history CAD, hyperlipidemia, PVD, back pain, fibromyalgia, neuropathy, COPD, obesity, GERD, CHF, depression, diabetes, diverticulosis, glaucoma, hypertension, left bundle branch block, anemia, Luetscher syndrome (mineralocorticoid excess), migraines, orthostatic hypotension, syncope, CKD who presents after multiple episodes of syncope and collapse.  Patient states that she was sitting at the table at home and she awoke on the floor with food around her.  She states that later she had another episode where she was sitting at her computer and awoke with her head on the keyboard.  She denies significant head trauma or falls.  As above she does have history of syncope and orthostatic hypotension she states that her blood pressure has been lower recently.  She Denies prodromal symptoms of palpitation, lightheadedness, dizziness. This is similar to previously syncope episodes per her report. She reports chronic SOB and intermittent Chest pain (last was several days ago). Denies fever, abdominal pain, nausea.  ED Course: Vital signs in the ED significant for one blood pressure reading of 106 systolic with other readings all in the low 110s to 120s.  Vital signs otherwise stable.  Lab work-up showed BMP with creatinine elevated to 1.43 which is around her baseline, glucose 127, hemoglobin 11.6 and CBC which is near baseline.  Troponin pending, respiratory panel for flu and Covid pending.  Urinalysis showed glucose, hemoglobin, rare bacteria.  Chest x-ray was without acute abnormality.  Review of Systems: As per HPI otherwise all other systems reviewed and are negative.  Past Medical History:  Diagnosis Date  . Arthralgia  01/14/2020  . Atherosclerotic heart disease of native coronary artery without angina pectoris 04/13/2014  . CHF (congestive heart failure) (HCC)   . Chronic bilateral low back pain with bilateral sciatica 01/13/2019  . Chronic bladder pain 11/08/2014   Last Assessment & Plan:  Formatting of this note might be different from the original. Patient has chronic pain syndrome in general I think this is unfortunately worsening her pelvic pain and bladder pain her examination today was very reassuring I am advised her to use the estrogen cream I did start her on Elavil 10 milligrams at that time if she does tolerated will increase it to 25 milligrams.   . Chronic idiopathic constipation 12/22/2014   Formatting of this note might be different from the original. Last Assessment & Plan:  For better bowel emptying please use either Citrucel / Benefiber start with 2 tablespoons daily and titrate up or down to effect.  Also please use glycerin suppositories as needed to assist in evacuation. Last Assessment & Plan:  Formatting of this note might be different from the original. For better bowel empt  . Chronic obstructive pulmonary disease, unspecified (HCC) 03/18/2018  . Class 1 obesity due to excess calories with serious comorbidity and body mass index (BMI) of 31.0 to 31.9 in adult 06/26/2020  . Coronary artery disease   . DDD (degenerative disc disease), cervical 01/13/2019  . Depression 11/05/2019  . Diabetic polyneuropathy associated with type 2 diabetes mellitus (HCC) 02/24/2019  . Diverticulitis 05/20/2018  . Diverticulitis of colon 03/18/2018  . Family history of ischemic heart disease (IHD) 08/16/2013  . Fibromyalgia affecting multiple sites 01/14/2020  . Gastro-esophageal reflux disease without esophagitis 01/13/2019  . Glaucoma 11/08/2014  .  History of anemia 12/12/2019  . Hordeolum externum of left upper eyelid 03/13/2020  . Hypertension 11/08/2014  . Iron deficiency anemia 01/13/2019  . Left renal mass 11/08/2019   . Leukocytosis 05/28/2018  . Low vitamin B12 level 03/13/2020  . Low vitamin D level 12/12/2019  . LV dysfunction 05/31/2019  . Malignant essential hypertension 01/22/2016  . Migraine, unspecified, not intractable, without status migrainosus 11/08/2014  . Mixed hyperlipidemia 01/13/2019  . Myocardial infarction (HCC)   . Other premature beats 11/08/2014  . 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 PVD (peripheral vascular disease) (HCC) 04/08/2017  . 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 Trochanteric bursitis of right hip 04/20/2019  . Type 2 diabetes mellitus without complications (HCC) 12/08/2013  . Vaginal atrophy 11/08/2014   Formatting of this note might be different from the original. Last Assessment & Plan:  For vaginal atrophy please place a pea size dab of Estrogen vaginal cream  into the vagina 3 times a week ( Monday, Wednesday, Friday) Last Assessment & Plan:  Formatting of this note might be different from the original. For vaginal atrophy please place a pea size dab of Estrogen vaginal cream  into the vagina     Past Surgical History:  Procedure Laterality Date  . ABDOMINAL HYSTERECTOMY    . BLEPHAROPLASTY Bilateral   . COLON SURGERY    . CORONARY ANGIOPLASTY WITH STENT PLACEMENT  2020   X3  . CORONARY STENT INTERVENTION N/A 08/03/2020   Procedure: CORONARY STENT INTERVENTION;  Surgeon: Tonny Bollman, MD;  Location: [facility] INVASIVE CV LAB;  Service: Cardiovascular;  Laterality: N/A;  . INTRAVASCULAR PRESSURE WIRE/FFR STUDY N/A 08/03/2020   Procedure: INTRAVASCULAR PRESSURE WIRE/FFR STUDY;  Surgeon: Tonny Bollman, MD;  Location: [facility] INVASIVE CV LAB;  Service: Cardiovascular;  Laterality: N/A;  . LEFT HEART CATH AND CORONARY ANGIOGRAPHY N/A 08/03/2020   Procedure: LEFT HEART CATH AND CORONARY ANGIOGRAPHY;  Surgeon: Tonny Bollman, MD;  Location: [facility] INVASIVE CV LAB;  Service: Cardiovascular;  Laterality: N/A;  . REFRACTIVE SURGERY Left    [place]     Social History  reports that she has quit smoking. She has never used smokeless tobacco. She reports that she does not drink alcohol and does not use drugs.  Allergies  Allergen Reactions  . Bee Venom Anaphylaxis  . Sumatriptan Other (See Comments) and Shortness Of Breath    Migraine worsened  . Tramadol Hives  . Amoxicillin-Pot Clavulanate Diarrhea, Nausea And Vomiting and Other (See Comments)    Other reaction(s): GI Intolerance  . Oxycodone Itching and Hives    Other reaction(s): Other (see comments) "Funny feeling in head"  "off the edge"   . Buprenorphine Hcl     Other reaction(s): Itching  . Clarithromycin     Abdominal pain and diarrhea Abdominal pain and diarrhea Abdominal pain and diarrhea  . Duloxetine Hcl     drowsiness drowsiness drowsiness  . Hydrocodone     Other reaction(s): Itching  . Lactose   . Liraglutide     Abdominal discomfort Abdominal discomfort Abdominal discomfort  . Tramadol Hcl     Other reaction(s): Itching    Family History  Problem Relation Age of Onset  . Lung cancer Mother   . Coronary artery disease Mother   . Diabetes Mother   . Glaucoma Mother   . Hypertension Mother   . Migraines Father   . Diabetes Maternal Grandmother   . Diabetes Daughter   . Migraines Son  Reviewed on admission  Prior to Admission medications   Medication Sig Start Date End Date Taking? Authorizing Provider  acetaminophen (TYLENOL) 325 MG tablet Take 2 tablets by mouth every 6 (six) hours as needed. 08/15/19   [provider]  ACETAMINOPHEN-BUTALBITAL 50-325 MG TABS Take 1 tablet by mouth every 4 (four) hours as needed.    [provider]  albuterol (PROVENTIL) (2.5 MG/3ML) 0.083% nebulizer solution INL CONTENTS OF 1 VIAL VIA NEBULIZER Q 4 H PRF WHEEZING OR COUGH 01/26/18   [provider]  albuterol (VENTOLIN HFA) 108 (90 Base) MCG/ACT inhaler Inhale 2 puffs into the lungs every 6 (six) hours as needed. 01/26/18   [provider]  aspirin 81 MG EC tablet 81 mg daily.    [provider]  baclofen (LIORESAL) 10 MG tablet Take 10 mg by mouth daily.  06/29/17   [provider]  budesonide (PULMICORT) 1 MG/2ML nebulizer solution Take 1 mg by nebulization in the morning and at bedtime.    [provider]  carvedilol (COREG) 6.25 MG tablet Take 1 tablet (6.25 mg total) by mouth 2 (two) times daily with a meal. 12/25/20   Tobb, Kardie, DO  COMBIVENT RESPIMAT 20-100 MCG/ACT AERS respimat Inhale 1 puff into the lungs 2 (two) times daily. 12/21/20   [provider]  dapagliflozin propanediol (FARXIGA) 10 MG TABS tablet Take 1 tablet (10 mg total) by mouth daily before breakfast. 11/12/20   Tobb, Kardie, DO  diclofenac Sodium (VOLTAREN) 1 % GEL Apply 2 g topically 2 (two) times daily as needed. 06/29/20   [provider]  dicyclomine (BENTYL) 10 MG capsule Take 10 mg by mouth every 6 (six) hours as needed for spasms.    [provider]  DULoxetine (CYMBALTA) 30 MG capsule Take 30 mg by mouth daily. 12/25/20   [provider]  ENTRESTO 24-26 MG TAKE 1 TABLET BY MOUTH TWICE DAILY 10/04/20   Tobb, Kardie, DO  esomeprazole (NEXIUM) 40 MG capsule Take 40 mg by mouth daily at 12 noon.    [provider]  famotidine (PEPCID) 40 MG tablet Take 40 mg by mouth at bedtime.    [provider]  ferrous sulfate 324 MG TBEC Take 324 mg by mouth daily with breakfast.    [provider]  fluticasone (FLONASE) 50 MCG/ACT nasal spray Place into both nostrils. 12/25/20   [provider]  folic acid (FOLVITE) 1 MG tablet Take 1 mg by mouth daily.    [provider]  furosemide (LASIX) 40 MG tablet Take 1 tablet (40 mg total) by mouth daily. 09/24/20   Tobb, Kardie, DO  insulin lispro (HUMALOG) 100 UNIT/ML KwikPen Inject 16 Units into the skin 3 (three) times daily. Administer 16 units three times daily before a meal per sliding scale 09/22/16    [provider]  isosorbide mononitrate (IMDUR) 60 MG 24 hr tablet Take 1 tablet (60 mg total) by mouth daily. 11/12/20 02/10/21  Tobb, Kardie, DO  latanoprost (XALATAN) 0.005 % ophthalmic solution SMARTSIG:In Eye(s) 12/25/20   [provider]  loratadine (CLARITIN) 10 MG tablet Take 10 mg by mouth daily. 09/19/20   [provider]  magnesium oxide (MAG-OX) 400 MG tablet Take by mouth. 12/11/20   [provider]  MAGNESIUM-OXIDE 400 (241.3 Mg) MG tablet Take 400 mg by mouth daily. 07/27/20   [provider]  nitroGLYCERIN (NITROSTAT) 0.4 MG SL tablet Place 1 tablet (0.4 mg total) under the tongue every 5 (five)  minutes as needed for chest pain. 08/07/20   Tobb, Kardie, DO  nystatin (MYCOSTATIN) 100000 UNIT/ML suspension Take 5 mLs by mouth 4 (four) times daily. 10/20/20   [provider]  nystatin cream (MYCOSTATIN) Apply topically.    [provider]  potassium chloride (KLOR-CON) 10 MEQ tablet Take 10 mEq by mouth daily. 10/07/20   [provider]  predniSONE (DELTASONE) 5 MG tablet Take by mouth. 08/31/20   [provider]  pregabalin (LYRICA) 50 MG capsule Take 50 mg by mouth 3 (three) times daily.    [provider]  rosuvastatin (CRESTOR) 40 MG tablet Take 40 mg by mouth daily.    [provider]  SEREVENT DISKUS 50 MCG/DOSE diskus inhaler Inhale 1 puff into the lungs 2 (two) times daily. 12/22/20   [provider]  SPIRIVA RESPIMAT 2.5 MCG/ACT AERS SMARTSIG:2 Puff(s) Via Inhaler Daily 12/22/20   [provider]  spironolactone (ALDACTONE) 25 MG tablet Take 0.5 tablets (12.5 mg total) by mouth daily. 09/04/20   Tobb, Lavona [place]oundKard[place], DO  SYMBICORT 80-4.5 MCG/ACT inhaler SMARTSIG:2 Puff(s) By Mouth Twice Daily 10/07/20   [provider]  ticagrelor (BRILINTA) 90 MG TABS tablet Take 1 tablet (90 mg total) by mouth in the morning and at bedtime. 09/04/20 09/04/21  Tobb, Kardie, DO  torsemide  (DEMADEX) 20 MG tablet Take 0.5 tablets (10 mg total) by mouth daily. 11/26/20   Tobb, Kardie, DO  TRESIBA FLEXTOUCH 200 UNIT/ML FlexTouch Pen Inject 40 Units into the skin 2 (two) times daily. 06/29/20   [provider]  TRINTELLIX 5 MG TABS tablet Take 5 mg by mouth daily. 08/14/20   [provider]  valACYclovir (VALTREX) 1000 MG tablet Take 1,000 mg by mouth 2 (two) times daily. 11/02/20   [provider]  vitamin B-12 (CYANOCOBALAMIN) 1000 MCG tablet Take 1,000 mcg by mouth daily.    [provider]  Vitamin D, Ergocalciferol, (DRISDOL) 1.25 MG (50000 UNIT) CAPS capsule Take 50,000 Units by mouth once a week. 12/25/20   [provider]  WAL-DRYL ALLERGY 12.5 MG/5ML liquid Take 12.5 mg by mouth. 09/16/20   [provider]    Physical Exam: Vitals:   12/26/20 2130 12/26/20 2145 12/26/20 2200 12/26/20 2215  BP: (!) 123/111 123/60 113/80 118/78  Pulse: 83 84 82 81  Resp: 18 15 17 18   Temp:      TempSrc:      SpO2: 99% 98% 99% 99%   Physical Exam Constitutional:      General: She is not in acute distress.    Appearance: Normal appearance. She is obese.  HENT:     Head: Normocephalic and atraumatic.     Mouth/Throat:     Mouth: Mucous membranes are moist.     Pharynx: Oropharynx is clear.  Eyes:     Extraocular Movements: Extraocular movements intact.     Pupils: Pupils are equal, round, and reactive to light.  Cardiovascular:     Rate and Rhythm: Normal rate and regular rhythm.     Pulses: Normal pulses.     Heart sounds: Normal heart sounds.  Pulmonary:     Effort: Pulmonary effort is normal. No respiratory distress.     Breath sounds: Normal breath sounds.  Abdominal:     General: Bowel sounds are normal. There is no distension.     Palpations: Abdomen is soft.     Tenderness: There is no abdominal tenderness.  Musculoskeletal:        General: No  swelling or deformity.  Skin:    General: Skin is warm and dry.   Neurological:     General: No focal deficit present.     Mental Status: Mental status is at baseline.    Labs on Admission: I have personally reviewed following labs and imaging studies  CBC: Recent Labs  Lab 12/26/20 1712  WBC 7.2  HGB 11.6*  HCT 38.0  MCV 89.8  PLT 345    Basic Metabolic Panel: Recent Labs  Lab 12/26/20 1712  NA 141  K 3.8  CL 109  CO2 22  GLUCOSE 127*  BUN 18  CREATININE 1.43*  CALCIUM 9.1    GFR: CrCl cannot be calculated (Unknown ideal weight.).  Liver Function Tests: No results for input(s): AST, ALT, ALKPHOS, BILITOT, PROT, ALBUMIN in the last 168 hours.  Urine analysis:    Component Value Date/Time   COLORURINE YELLOW 12/26/2020 1655   APPEARANCEUR HAZY (A) 12/26/2020 1655   LABSPEC 1.013 12/26/2020 1655   PHURINE 5.0 12/26/2020 1655   GLUCOSEU >=500 (A) 12/26/2020 1655   HGBUR MODERATE (A) 12/26/2020 1655   BILIRUBINUR NEGATIVE 12/26/2020 1655   KETONESUR NEGATIVE 12/26/2020 1655   PROTEINUR NEGATIVE 12/26/2020 1655   NITRITE NEGATIVE 12/26/2020 1655   LEUKOCYTESUR NEGATIVE 12/26/2020 1655    Radiological Exams on Admission: DG Chest 1 View  Result Date: 12/26/2020 CLINICAL DATA:  Chest pain EXAM: CHEST  1 VIEW COMPARISON:  12/23/2020 FINDINGS: Single frontal view of the chest demonstrates stable enlargement of the cardiac silhouette. No airspace disease, effusion, or pneumothorax. No acute bony abnormalities. IMPRESSION: 1. Stable cardiomegaly.  No acute process. Electronically Signed   By: Sharlet Salina M.D.   On: 12/26/2020 21:05    EKG: Independently reviewed.  Normal sinus rhythm with left bundle branch block, rate 81 bpm.  Similar to previous.  Assessment/Plan Principal Problem:   Syncope and collapse Active Problems:   Chronic bilateral low back pain with bilateral sciatica   Chronic obstructive pulmonary disease, unspecified (HCC)   Depression   Diabetic polyneuropathy associated with type 2 diabetes mellitus  (HCC)   Fibromyalgia affecting multiple sites   Gastro-esophageal reflux disease without esophagitis   History of anemia   Low hemoglobin   Hypertension   PVD (peripheral vascular disease) (HCC)   Migraine, unspecified, not intractable, without status migrainosus   Mixed hyperlipidemia   Moderate episode of recurrent major depressive disorder (HCC)   Type 2 diabetes mellitus without complications (HCC)   Hypertensive heart disease with heart failure (HCC)   Coronary artery disease   Depressed left ventricular ejection fraction   Luetscher's syndrome   Orthostatic hypotension   Syncope  History of orthostatic hypotension History of syncope Syncope and collapse > Possibly orthostatic due to history of this, however her lack of prodrome and sudden onset is suspicious for arrhythmic etiology - Orthostatic vitals - Monitor on telemetry - Echocardiogram, was due for this outpatient as well - TSH  CAD status post MI and stenting x2 Hyperlipidemia PVD - Continue home aspirin, Brilinta, Coreg, Entresto - Follow-up troponin  Back pain Fibromyalgia Neuropathy - Continue home Bentyl, Lyrica  COPD - Continue home Symbicort, Spiriva (replaces Serevent), Combivent, Pulmicort, as needed albuterol  GERD  - Continue home PPI  CHF Hypertension > Blood pressure somewhat soft in the 100s, but with no hypotension - Continue home Coreg, Entresto, Imdur, spironolactone, and torsemide  Depression - Continue home Trintellix  Diabetes > 40 units twice daily long-acting and 16 units 3 times daily  short acting at home - 25 units twice daily Lantus - 8 units 3 times daily with meals - SSI  Anemia > Hemoglobin stable at 11.6 - Continue home iron, folate, B12  CKD > Creatinine stable at 1.43 - Avoid nephrotoxic agents - Trend renal function electrolytes  Hx Migraines - As needed Tylenol for now  Of note, Hx of Luetscher syndrome (mineralocorticoid excess)  DVT  prophylaxis: Lovenox  Code Status:   Full  Family Communication:  Patient states that she informed her daughter, I was going to attempt to call her however there is no information listed for her in the chart. Disposition Plan:   Patient is from:  Home  Anticipated DC to:  Home  Anticipated DC date:  1 to 2 days  Anticipated DC barriers: None  Consults called:  None  Admission status:  Observation, telemetry   Severity of Illness: The appropriate patient status for this patient is OBSERVATION. Observation status is judged to be reasonable and necessary in order to provide the required intensity of service to ensure the patient's safety. The patient's presenting symptoms, physical exam findings, and initial radiographic and laboratory data in the context of their medical condition is felt to place them at decreased risk for further clinical deterioration. Furthermore, it is anticipated that the patient will be medically stable for discharge from the hospital within 2 midnights of admission. The following factors support the patient status of observation.   " The patient's presenting symptoms include syncope and collapse. " The physical exam findings include stable exam. " The initial radiographic and laboratory data are stable with stable creatinine 1.43, glucose 127, hemoglobin 11.6.  Troponin pending respiratory panel pending.Synetta Fail MD [facility]  How to contact the [facility] Attending or Consulting provider 7A - 7P or covering provider during after hours 7P -7A, for this patient?   1. Check the care team in [facility] and look for a) attending/consulting [facility] provider listed and b) the [facility] team listed 2. Log into [email or web address] and use [place]'s universal password to access. If you do not have the password, please contact the hospital operator. 3. Locate the [facility] provider you are looking for under [facility] and page to a number that you can be directly reached. 4. If  you still have difficulty reaching the provider, please page the [facility] (Director on Call) for the Hospitalists listed on amion for assistance.  12/26/2020, 10:47 PM

## 2020-12-27 ENCOUNTER — Ambulatory Visit: Payer: Medicare Other | Admitting: Cardiology

## 2020-12-27 ENCOUNTER — Encounter (HOSPITAL_COMMUNITY): Payer: Self-pay | Admitting: Internal Medicine

## 2020-12-27 ENCOUNTER — Other Ambulatory Visit: Payer: Self-pay

## 2020-12-27 ENCOUNTER — Observation Stay (HOSPITAL_BASED_OUTPATIENT_CLINIC_OR_DEPARTMENT_OTHER): Payer: Medicare Other

## 2020-12-27 DIAGNOSIS — I10 Essential (primary) hypertension: Secondary | ICD-10-CM | POA: Diagnosis not present

## 2020-12-27 DIAGNOSIS — K219 Gastro-esophageal reflux disease without esophagitis: Secondary | ICD-10-CM | POA: Diagnosis present

## 2020-12-27 DIAGNOSIS — K5790 Diverticulosis of intestine, part unspecified, without perforation or abscess without bleeding: Secondary | ICD-10-CM | POA: Diagnosis present

## 2020-12-27 DIAGNOSIS — I429 Cardiomyopathy, unspecified: Secondary | ICD-10-CM | POA: Diagnosis not present

## 2020-12-27 DIAGNOSIS — Z20822 Contact with and (suspected) exposure to covid-19: Secondary | ICD-10-CM | POA: Diagnosis present

## 2020-12-27 DIAGNOSIS — I130 Hypertensive heart and chronic kidney disease with heart failure and stage 1 through stage 4 chronic kidney disease, or unspecified chronic kidney disease: Secondary | ICD-10-CM | POA: Diagnosis present

## 2020-12-27 DIAGNOSIS — I5022 Chronic systolic (congestive) heart failure: Secondary | ICD-10-CM | POA: Diagnosis not present

## 2020-12-27 DIAGNOSIS — R0989 Other specified symptoms and signs involving the circulatory and respiratory systems: Secondary | ICD-10-CM | POA: Diagnosis not present

## 2020-12-27 DIAGNOSIS — J449 Chronic obstructive pulmonary disease, unspecified: Secondary | ICD-10-CM | POA: Diagnosis present

## 2020-12-27 DIAGNOSIS — I447 Left bundle-branch block, unspecified: Secondary | ICD-10-CM | POA: Diagnosis present

## 2020-12-27 DIAGNOSIS — F32A Depression, unspecified: Secondary | ICD-10-CM | POA: Diagnosis present

## 2020-12-27 DIAGNOSIS — I5042 Chronic combined systolic (congestive) and diastolic (congestive) heart failure: Secondary | ICD-10-CM | POA: Diagnosis present

## 2020-12-27 DIAGNOSIS — E1151 Type 2 diabetes mellitus with diabetic peripheral angiopathy without gangrene: Secondary | ICD-10-CM | POA: Diagnosis present

## 2020-12-27 DIAGNOSIS — E1122 Type 2 diabetes mellitus with diabetic chronic kidney disease: Secondary | ICD-10-CM | POA: Diagnosis present

## 2020-12-27 DIAGNOSIS — I2510 Atherosclerotic heart disease of native coronary artery without angina pectoris: Secondary | ICD-10-CM | POA: Diagnosis not present

## 2020-12-27 DIAGNOSIS — I951 Orthostatic hypotension: Secondary | ICD-10-CM | POA: Diagnosis present

## 2020-12-27 DIAGNOSIS — E782 Mixed hyperlipidemia: Secondary | ICD-10-CM | POA: Diagnosis present

## 2020-12-27 DIAGNOSIS — G43909 Migraine, unspecified, not intractable, without status migrainosus: Secondary | ICD-10-CM | POA: Diagnosis present

## 2020-12-27 DIAGNOSIS — I255 Ischemic cardiomyopathy: Secondary | ICD-10-CM | POA: Diagnosis present

## 2020-12-27 DIAGNOSIS — R55 Syncope and collapse: Secondary | ICD-10-CM

## 2020-12-27 DIAGNOSIS — M797 Fibromyalgia: Secondary | ICD-10-CM | POA: Diagnosis present

## 2020-12-27 DIAGNOSIS — F331 Major depressive disorder, recurrent, moderate: Secondary | ICD-10-CM | POA: Diagnosis present

## 2020-12-27 DIAGNOSIS — D631 Anemia in chronic kidney disease: Secondary | ICD-10-CM | POA: Diagnosis present

## 2020-12-27 DIAGNOSIS — M5030 Other cervical disc degeneration, unspecified cervical region: Secondary | ICD-10-CM | POA: Diagnosis present

## 2020-12-27 DIAGNOSIS — F419 Anxiety disorder, unspecified: Secondary | ICD-10-CM | POA: Diagnosis present

## 2020-12-27 DIAGNOSIS — E11319 Type 2 diabetes mellitus with unspecified diabetic retinopathy without macular edema: Secondary | ICD-10-CM | POA: Diagnosis present

## 2020-12-27 DIAGNOSIS — E1142 Type 2 diabetes mellitus with diabetic polyneuropathy: Secondary | ICD-10-CM | POA: Diagnosis present

## 2020-12-27 DIAGNOSIS — N1831 Chronic kidney disease, stage 3a: Secondary | ICD-10-CM | POA: Diagnosis present

## 2020-12-27 LAB — COMPREHENSIVE METABOLIC PANEL
ALT: 11 U/L (ref 0–44)
AST: 17 U/L (ref 15–41)
Albumin: 3.2 g/dL — ABNORMAL LOW (ref 3.5–5.0)
Alkaline Phosphatase: 46 U/L (ref 38–126)
Anion gap: 8 (ref 5–15)
BUN: 16 mg/dL (ref 8–23)
CO2: 23 mmol/L (ref 22–32)
Calcium: 8.7 mg/dL — ABNORMAL LOW (ref 8.9–10.3)
Chloride: 109 mmol/L (ref 98–111)
Creatinine, Ser: 1.34 mg/dL — ABNORMAL HIGH (ref 0.44–1.00)
GFR, Estimated: 43 mL/min — ABNORMAL LOW (ref >60)
Glucose, Bld: 204 mg/dL — ABNORMAL HIGH (ref 70–99)
Potassium: 3.7 mmol/L (ref 3.5–5.1)
Sodium: 140 mmol/L (ref 135–145)
Total Bilirubin: 0.5 mg/dL (ref 0.3–1.2)
Total Protein: 6.4 g/dL — ABNORMAL LOW (ref 6.5–8.1)

## 2020-12-27 LAB — HIV ANTIBODY (ROUTINE TESTING W REFLEX): HIV Screen 4th Generation wRfx: NONREACTIVE

## 2020-12-27 LAB — CBC
HCT: 36.6 % (ref 36.0–46.0)
Hemoglobin: 11.2 g/dL — ABNORMAL LOW (ref 12.0–15.0)
MCH: 27.8 pg (ref 26.0–34.0)
MCHC: 30.6 g/dL (ref 30.0–36.0)
MCV: 90.8 fL (ref 80.0–100.0)
Platelets: 308 10*3/uL (ref 150–400)
RBC: 4.03 MIL/uL (ref 3.87–5.11)
RDW: 15.8 % — ABNORMAL HIGH (ref 11.5–15.5)
WBC: 8.3 10*3/uL (ref 4.0–10.5)
nRBC: 0 % (ref 0.0–0.2)

## 2020-12-27 LAB — CBG MONITORING, ED
Glucose-Capillary: 199 mg/dL — ABNORMAL HIGH (ref 70–99)
Glucose-Capillary: 154 mg/dL — ABNORMAL HIGH (ref 70–99)
Glucose-Capillary: 99 mg/dL (ref 70–99)

## 2020-12-27 LAB — ECHOCARDIOGRAM COMPLETE
Area-P 1/2: 5.27 cm2
Height: 65 in
Weight: 3008 oz

## 2020-12-27 LAB — GLUCOSE, CAPILLARY
Glucose-Capillary: 113 mg/dL — ABNORMAL HIGH (ref 70–99)
Glucose-Capillary: 225 mg/dL — ABNORMAL HIGH (ref 70–99)

## 2020-12-27 NOTE — Progress Notes (Signed)
  Echocardiogram 2D Echocardiogram has been performed.  Karen Warner [name] 12/27/2020, 10:37 AM

## 2020-12-27 NOTE — Progress Notes (Signed)
PROGRESS NOTE    Karen Warner  JGO:[id number]  DOB: 11/28/1951  DOA: 12/26/2020 PCP: Laurel Dimmer, FNP Outpatient Specialists:   Hospital course:  Karen Warner is a 70 y.o. female with medical history significant of since her history CAD, hyperlipidemia, PVD, back pain, fibromyalgia, neuropathy, COPD, obesity, GERD, CHF, depression, diabetes, diverticulosis, glaucoma, hypertension, left bundle branch block, anemia, Luetscher syndrome (mineralocorticoid excess), migraines, orthostatic hypotension, syncope, CKD was sent from her PCP/cardiologist office for recurrent syncope and orthostatic hypotension.   Subjective:  Patient states she is frustrated with the recurrent syncope which has been going on for the past 4 months.  Medications in the past 4 months include Farxiga and inhaled bronchodilators.  Patient notes she has no prodrome prior to syncope.   Objective: Vitals:   12/27/20 1527 12/27/20 1715 12/27/20 1743 12/27/20 1802  BP: 101/65 (!) 95/54 (!) 98/55 96/81  Pulse: 79 79 78 86  Resp: 17 15 16 17   Temp: 98.2 F (36.8 C)  98 F (36.7 C) 98 F (36.7 C)  TempSrc: Oral  Oral Oral  SpO2: 98% 97% 98% 100%  Weight:      Height:       No intake or output data in the 24 hours ending 12/27/20 1830 Filed Weights   12/26/20 2300  Weight: 85.3 kg     Exam:  General: Relatively well-appearing female looking older than stated age lying flat in bed in no acute distress. Eyes: sclera anicteric, conjuctiva mild injection bilaterally CVS: S1-S2, regular  Respiratory:  decreased air entry bilaterally secondary to decreased inspiratory effort, rales at bases  GI: NABS, soft, NT  LE: No edema.  Neuro: A/O x 3, Moving all extremities equally with normal strength, CN 3-12 intact, grossly nonfocal.  Psych: patient is logical and coherent, judgement and insight appear normal, mood and affect appropriate to situation.   Assessment & Plan:    Recurrent syncope with orthostatic changes Patient is not orthostatic here however will repeat  Of note syncope started 4 months ago when 12/28/20 was also started, possibly patient is having diuresis causing intravascular volume depletion. Will hold Marcelline Deist. However agree that her lack of prodrome is more suggestive of possible arrhythmia Continue patient on telemetry Echocardiogram ordered and pending Cardiology consult in the morning  HTN Blood pressure low normal on Coreg, Entresto, indoor, spironolactone and torsemide She may benefit from decreasing some of these medications given orthostasis Await cardiology input  DM2 Blood sugars under reasonable control on present regimen Farxiga being held  CAD/PVD No acute issues  Continue aspirin Brilinta Coreg and Entresto  COPD No wheezes on exam Continue home inhaled bronchodilators  Anxiety depression Continue home Trintellix    DVT prophylaxis: Lovenox Code Status: Full Family Communication: None Disposition Plan:   Patient is from: Home  Anticipated Discharge Location: Home  Barriers to Discharge: Still getting worked up for orthostatic syncope  Is patient medically stable for Discharge: No   Consultants:  Cardiology consultation in the morning  Procedures:  None  Antimicrobials:  None   Data Reviewed:  Basic Metabolic Panel: Recent Labs  Lab 12/26/20 1712 12/26/20 2200 12/27/20 0259  NA 141  --  140  K 3.8  --  3.7  CL 109  --  109  CO2 22  --  23  GLUCOSE 127*  --  204*  BUN 18  --  16  CREATININE 1.43*  --  1.34*  CALCIUM 9.1  --  8.7*  MG  --  2.3  --    Liver Function Tests: Recent Labs  Lab 12/27/20 0259  AST 17  ALT 11  ALKPHOS 46  BILITOT 0.5  PROT 6.4*  ALBUMIN 3.2*   No results for input(s): LIPASE, AMYLASE in the last 168 hours. No results for input(s): AMMONIA in the last 168 hours. CBC: Recent Labs  Lab 12/26/20 1712 12/27/20 0259  WBC 7.2 8.3  HGB 11.6* 11.2*   HCT 38.0 36.6  MCV 89.8 90.8  PLT 345 308   Cardiac Enzymes: No results for input(s): CKTOTAL, CKMB, CKMBINDEX, TROPONINI in the last 168 hours. BNP (last 3 results) No results for input(s): PROBNP in the last 8760 hours. CBG: Recent Labs  Lab 12/26/20 2209 12/27/20 0249 12/27/20 0627 12/27/20 1228  GLUCAP 75 199* 154* 99    Recent Results (from the past 240 hour(s))  SARS Coronavirus 2 by RT PCR (hospital order, performed in [facility] hospital lab) Nasopharyngeal Nasopharyngeal Swab     Status: None   Collection Time: 12/26/20  9:40 PM   Specimen: Nasopharyngeal Swab  Result Value Ref Range Status   SARS Coronavirus 2 NEGATIVE NEGATIVE Final    Comment: (NOTE) SARS-CoV-2 target nucleic acids are NOT DETECTED.  The SARS-CoV-2 RNA is generally detectable in upper and lower respiratory specimens during the acute phase of infection. The lowest concentration of SARS-CoV-2 viral copies this assay can detect is 250 copies / mL. A negative result does not preclude SARS-CoV-2 infection and should not be used as the sole basis for treatment or other patient management decisions.  A negative result may occur with improper specimen collection / handling, submission of specimen other than nasopharyngeal swab, presence of viral mutation(s) within the areas targeted by this assay, and inadequate number of viral copies (<250 copies / mL). A negative result must be combined with clinical observations, patient history, and epidemiological information.  Fact Sheet for Patients:   BoilerBrush.com.cy  Fact Sheet for Healthcare Providers: https://pope.com/  This test is not yet approved or  cleared by the [place] FDA and has been authorized for detection and/or diagnosis of SARS-CoV-2 by FDA under an Emergency Use Authorization (EUA).  This EUA will remain in effect (meaning this test can be used) for the duration of the COVID-19  declaration under Section 564(b)(1) of the Act, 21 U.S.C. section 360bbb-3(b)(1), unless the authorization is terminated or revoked sooner.  Performed at [facility], [address], [place], [place] [address]       Studies: DG Chest 1 View  Result Date: 12/26/2020 CLINICAL DATA:  Chest pain EXAM: CHEST  1 VIEW COMPARISON:  12/23/2020 FINDINGS: Single frontal view of the chest demonstrates stable enlargement of the cardiac silhouette. No airspace disease, effusion, or pneumothorax. No acute bony abnormalities. IMPRESSION: 1. Stable cardiomegaly.  No acute process. Electronically Signed   By: Sharlet Salina M.D.   On: 12/26/2020 21:05   ECHOCARDIOGRAM COMPLETE  Result Date: 12/27/2020    ECHOCARDIOGRAM REPORT   Patient Name:   Karen Warner Date of Exam: 12/27/2020 Medical Rec #:  [id number]                 Height:       65.0 in Accession #:    [id number]                Weight:       188.0 lb Date of Birth:  1951-08-13  BSA:          1.927 m Patient Age:    69 years                  BP:           72/61 mmHg Patient Gender: F                         HR:           77 bpm. Exam Location:  Inpatient Procedure: 2D Echo, Cardiac Doppler and Color Doppler Indications:    R55 Syncope  History:        Patient has no prior history of Echocardiogram examinations.                 CHF, CAD and Previous Myocardial Infarction, COPD; Risk                 Factors:Hypertension, Diabetes and Dyslipidemia.  Sonographer:    Elmarie Shiley Dance Referring Phys: [id number] Cecille Po MELVIN IMPRESSIONS  1. Left ventricular ejection fraction, by estimation, is 20 to 25%. The left ventricle has severely decreased function. The left ventricle demonstrates global hypokinesis. The left ventricular internal cavity size was mildly dilated. Left ventricular diastolic parameters are consistent with Grade I diastolic dysfunction (impaired relaxation).  2. Right ventricular systolic function is normal. The  right ventricular size is normal. Tricuspid regurgitation signal is inadequate for assessing PA pressure.  3. The mitral valve is normal in structure. Trivial mitral valve regurgitation. No evidence of mitral stenosis.  4. The aortic valve is tricuspid. Aortic valve regurgitation is not visualized. No aortic stenosis is present.  5. The inferior vena cava is normal in size with greater than 50% respiratory variability, suggesting right atrial pressure of 3 mmHg. FINDINGS  Left Ventricle: Left ventricular ejection fraction, by estimation, is 20 to 25%. The left ventricle has severely decreased function. The left ventricle demonstrates global hypokinesis. The left ventricular internal cavity size was mildly dilated. There is no left ventricular hypertrophy. Left ventricular diastolic parameters are consistent with Grade I diastolic dysfunction (impaired relaxation). Right Ventricle: The right ventricular size is normal. No increase in right ventricular wall thickness. Right ventricular systolic function is normal. Tricuspid regurgitation signal is inadequate for assessing PA pressure. Left Atrium: Left atrial size was normal in size. Right Atrium: Right atrial size was normal in size. Pericardium: There is no evidence of pericardial effusion. Mitral Valve: The mitral valve is normal in structure. Trivial mitral valve regurgitation. No evidence of mitral valve stenosis. Tricuspid Valve: The tricuspid valve is normal in structure. Tricuspid valve regurgitation is not demonstrated. Aortic Valve: The aortic valve is tricuspid. Aortic valve regurgitation is not visualized. No aortic stenosis is present. Pulmonic Valve: The pulmonic valve was normal in structure. Pulmonic valve regurgitation is not visualized. Aorta: The aortic root is normal in size and structure. Venous: The inferior vena cava is normal in size with greater than 50% respiratory variability, suggesting right atrial pressure of 3 mmHg. IAS/Shunts: No atrial  level shunt detected by color flow Doppler.  LEFT VENTRICLE PLAX 2D LVOT diam:     1.70 cm  Diastology LV SV:         33       LV e' medial:    4.13 cm/s LV SV Index:   17       LV E/e' medial:  21.6 LVOT Area:     2.27 cm LV e'  lateral:   4.03 cm/s                         LV E/e' lateral: 22.1  RIGHT VENTRICLE             IVC RV Basal diam:  2.50 cm     IVC diam: 1.70 cm RV S prime:     12.60 cm/s TAPSE (M-mode): 1.6 cm LEFT ATRIUM             Index       RIGHT ATRIUM           Index LA Vol (A2C):   54.4 ml 28.23 ml/m RA Area:     13.20 cm LA Vol (A4C):   38.0 ml 19.72 ml/m RA Volume:   33.40 ml  17.33 ml/m LA Biplane Vol: 46.7 ml 24.24 ml/m  AORTIC VALVE LVOT Vmax:   80.80 cm/s LVOT Vmean:  58.400 cm/s LVOT VTI:    0.145 m  AORTA Ao Root diam: 2.90 cm Ao Asc diam:  3.10 cm MITRAL VALVE MV Area (PHT): 5.27 cm     SHUNTS MV Decel Time: 144 msec     Systemic VTI:  0.14 m MV E velocity: 89.20 cm/s   Systemic Diam: 1.70 cm MV A velocity: 115.00 cm/s MV E/A ratio:  0.78 Marca Ancona MD Electronically signed by Marca Ancona MD Signature Date/Time: 12/27/2020/6:23:47 PM    Final      Scheduled Meds: . arformoterol  15 mcg Nebulization BID  . aspirin EC  81 mg Oral Daily  . carvedilol  6.25 mg Oral BID WC  . enoxaparin (LOVENOX) injection  40 mg Subcutaneous QHS  . ferrous sulfate  324 mg Oral Q breakfast  . folic acid  1 mg Oral Daily  . insulin aspart  0-15 Units Subcutaneous TID WC  . insulin glargine  25 Units Subcutaneous BID  . Ipratropium-Albuterol  1 puff Inhalation BID  . isosorbide mononitrate  60 mg Oral Daily  . magnesium oxide  400 mg Oral Daily  . pantoprazole  40 mg Oral Daily  . potassium chloride  10 mEq Oral Daily  . pregabalin  50 mg Oral TID  . rosuvastatin  40 mg Oral Daily  . sacubitril-valsartan  1 tablet Oral BID  . sodium chloride flush  3 mL Intravenous Q12H  . spironolactone  12.5 mg Oral Daily  . ticagrelor  90 mg Oral BID  . torsemide  10 mg Oral Daily  .  umeclidinium bromide  1 puff Inhalation Daily  . vitamin B-12  1,000 mcg Oral Daily  . vortioxetine HBr  5 mg Oral QHS   Continuous Infusions:  Principal Problem:   Syncope and collapse Active Problems:   Chronic bilateral low back pain with bilateral sciatica   Chronic obstructive pulmonary disease, unspecified (HCC)   Depression   Diabetic polyneuropathy associated with type 2 diabetes mellitus (HCC)   Fibromyalgia affecting multiple sites   Gastro-esophageal reflux disease without esophagitis   History of anemia   Low hemoglobin   Hypertension   PVD (peripheral vascular disease) (HCC)   Migraine, unspecified, not intractable, without status migrainosus   Mixed hyperlipidemia   Moderate episode of recurrent major depressive disorder (HCC)   Type 2 diabetes mellitus without complications (HCC)   Hypertensive heart disease with heart failure (HCC)   Coronary artery disease   Depressed left ventricular ejection fraction   Luetscher's syndrome   Orthostatic hypotension  Syncope     Chadd Tollison Orma Flamingublu Kendarious Gudino, [facility]  If 7PM-7AM, please contact night-coverage [email or web address] Password [email or web address] 12/27/2020, 6:30 PM    LOS: 0 days

## 2020-12-27 NOTE — Progress Notes (Signed)
Attempted to call nurse back to update her on ready room but a different nurse answered the phone and she just came back from lunch and knows nothing about this. The ready room is now updated in the system.

## 2020-12-27 NOTE — ED Notes (Signed)
Report is given, room is not ready. RN to call back when room is clean. CN notified.

## 2020-12-27 NOTE — ED Notes (Signed)
Report attempted and placed on hold for more than 5 min.

## 2020-12-27 NOTE — Progress Notes (Signed)
Patient arrived to unit in NAD, VS stable and patient free from pain. Patient oriented to room and call bell in reach.   

## 2020-12-27 NOTE — Progress Notes (Signed)
Instructed patient that she needs to call when wanting to ambulate in her room or to go to the bathroom due to her syncopal episodes. She wanted to be left sitting in the chair to eat her dinner, call bell in reach and agreed to call when she wanted to get up.

## 2020-12-28 DIAGNOSIS — I447 Left bundle-branch block, unspecified: Secondary | ICD-10-CM | POA: Diagnosis not present

## 2020-12-28 DIAGNOSIS — I429 Cardiomyopathy, unspecified: Secondary | ICD-10-CM

## 2020-12-28 DIAGNOSIS — R55 Syncope and collapse: Secondary | ICD-10-CM

## 2020-12-28 DIAGNOSIS — I2510 Atherosclerotic heart disease of native coronary artery without angina pectoris: Secondary | ICD-10-CM | POA: Diagnosis not present

## 2020-12-28 LAB — BASIC METABOLIC PANEL
Anion gap: 10 (ref 5–15)
BUN: 12 mg/dL (ref 8–23)
CO2: 23 mmol/L (ref 22–32)
Calcium: 9 mg/dL (ref 8.9–10.3)
Chloride: 105 mmol/L (ref 98–111)
Creatinine, Ser: 1.22 mg/dL — ABNORMAL HIGH (ref 0.44–1.00)
GFR, Estimated: 48 mL/min — ABNORMAL LOW (ref >60)
Glucose, Bld: 105 mg/dL — ABNORMAL HIGH (ref 70–99)
Potassium: 3.7 mmol/L (ref 3.5–5.1)
Sodium: 138 mmol/L (ref 135–145)

## 2020-12-28 LAB — MAGNESIUM: Magnesium: 2 mg/dL (ref 1.7–2.4)

## 2020-12-28 LAB — GLUCOSE, CAPILLARY
Glucose-Capillary: 101 mg/dL — ABNORMAL HIGH (ref 70–99)
Glucose-Capillary: 165 mg/dL — ABNORMAL HIGH (ref 70–99)
Glucose-Capillary: 102 mg/dL — ABNORMAL HIGH (ref 70–99)
Glucose-Capillary: 179 mg/dL — ABNORMAL HIGH (ref 70–99)

## 2020-12-28 LAB — CBC
HCT: 37.2 % (ref 36.0–46.0)
Hemoglobin: 11.1 g/dL — ABNORMAL LOW (ref 12.0–15.0)
MCH: 26.8 pg (ref 26.0–34.0)
MCHC: 29.8 g/dL — ABNORMAL LOW (ref 30.0–36.0)
MCV: 89.9 fL (ref 80.0–100.0)
Platelets: 320 10*3/uL (ref 150–400)
RBC: 4.14 MIL/uL (ref 3.87–5.11)
RDW: 15.5 % (ref 11.5–15.5)
WBC: 6.9 10*3/uL (ref 4.0–10.5)
nRBC: 0 % (ref 0.0–0.2)

## 2020-12-28 LAB — TSH: TSH: 1.606 u[IU]/mL (ref 0.350–4.500)

## 2020-12-28 MED ORDER — TICAGRELOR 90 MG PO TABS
90.0000 mg | ORAL_TABLET | Freq: Two times a day (BID) | ORAL | Status: AC
  Administered 2020-12-28 – 2020-12-29 (×3): 90 mg via ORAL
  Filled 2020-12-28 (×3): qty 1

## 2020-12-28 NOTE — Consult Note (Signed)
Cardiology Consultation:   Patient ID: Karen Warner MRN: [id number]; DOB: [birth year]  Admit date: 12/26/2020 Date of Consult: 12/28/2020  Primary Care Provider: Laurel Dimmer, FNP [facility] Cardiologist: Thomasene Ripple, DO    Patient Profile:   Karen Warner is a 70 y.o. female with a hx of CAD s/p stenting to LAD, chronic systolic heart failure secondary to ischemic cardiomyopathy, hypertension, hyperlipidemia, recurrent syncope and morbid obesity who is being seen today for the evaluation of syncope at the request of Dr. Luberta Robertson.  Hx of CAD s/p DES to LAD in 2020. LVEF was 40-45% at that time.  LVEF of 25-30% by echo 05/2020.   Last cardiac catheterization September 2021 in setting of non-STEMI showed severe stenosis of proximal LAD s/p overlapping DES guided by pressure wire analysis.Continued patency of the distal stented segment in the LAD, but evidence of a possible chronic dissection at the distal stent edge into a small caliber apical LAD.   Last seen by Dr. Servando Salina 11/12/2020.  History of Present Illness:   [name] admitted for syncope.  Patient reported history of multiple syncope in past 3 to 4 months.  Sometimes occurring multiple times in a week.  Most of her syncope episode occurred while sitting.  Denied prodromal symptoms of chest pain, dizziness, shortness of breath or palpitation.  Patient lives with fianc and stepchild who has witnessed some of these episode.  No seizure-like activity.  On day of admit January 26 patient had a 3 episode of syncope.  First episode while on breakfast table>> found herself slammed on table.  Second episode while taking pills on couch>> found herself on couch & pills on the follow. 3rd episode while on computer table.  Patient denies any prodromal symptoms.  Never occurred while walking.  She called our office and advised to go to ER.  Patient reported previously admitted at [facility] for  dehydration.  She also had recent chest pain requiring sublingual nitroglycerin and evaluation in ER.  She carries diagnoses of orthostatic hypotension.  Reports intermittent low blood pressure.  However, patient never felt dizzy while standing from sitting or laying position.  He has pending sleep study in March 2022.  No syncope episode while admitted.  Telemetry without arrhythmia.  TSH normal.  Hemoglobin stable. Serum creatinine 1.43>>1.34>>1.22 (baseline creatinine 1.1-1.2 with suspected CKD stage III)  Echo 12/27/20 1. Left ventricular ejection fraction, by estimation, is 20 to 25%. The  left ventricle has severely decreased function. The left ventricle  demonstrates global hypokinesis. The left ventricular internal cavity size  was mildly dilated. Left ventricular  diastolic parameters are consistent with Grade I diastolic dysfunction  (impaired relaxation).  2. Right ventricular systolic function is normal. The right ventricular  size is normal. Tricuspid regurgitation signal is inadequate for assessing  PA pressure.  3. The mitral valve is normal in structure. Trivial mitral valve  regurgitation. No evidence of mitral stenosis.  4. The aortic valve is tricuspid. Aortic valve regurgitation is not  visualized. No aortic stenosis is present.  5. The inferior vena cava is normal in size with greater than 50%  respiratory variability, suggesting right atrial pressure of 3 mmHg.   Past Medical History:  Diagnosis Date  . Arthralgia 01/14/2020  . Atherosclerotic heart disease of native coronary artery without angina pectoris 04/13/2014  . CHF (congestive heart failure) (HCC)   . Chronic bilateral low back pain with bilateral sciatica 01/13/2019  . Chronic bladder pain 11/08/2014   Last  Assessment & Plan:  Formatting of this note might be different from the original. Patient has chronic pain syndrome in general I think this is unfortunately worsening her pelvic pain and bladder pain  her examination today was very reassuring I am advised her to use the estrogen cream I did start her on Elavil 10 milligrams at that time if she does tolerated will increase it to 25 milligrams.   . Chronic idiopathic constipation 12/22/2014   Formatting of this note might be different from the original. Last Assessment & Plan:  For better bowel emptying please use either Citrucel / Benefiber start with 2 tablespoons daily and titrate up or down to effect.  Also please use glycerin suppositories as needed to assist in evacuation. Last Assessment & Plan:  Formatting of this note might be different from the original. For better bowel empt  . Chronic obstructive pulmonary disease, unspecified (HCC) 03/18/2018  . Class 1 obesity due to excess calories with serious comorbidity and body mass index (BMI) of 31.0 to 31.9 in adult 06/26/2020  . Coronary artery disease   . DDD (degenerative disc disease), cervical 01/13/2019  . Depression 11/05/2019  . Diabetic polyneuropathy associated with type 2 diabetes mellitus (HCC) 02/24/2019  . Diverticulitis 05/20/2018  . Diverticulitis of colon 03/18/2018  . Family history of ischemic heart disease (IHD) 08/16/2013  . Fibromyalgia affecting multiple sites 01/14/2020  . Gastro-esophageal reflux disease without esophagitis 01/13/2019  . Glaucoma 11/08/2014  . History of anemia 12/12/2019  . Hordeolum externum of left upper eyelid 03/13/2020  . Hypertension 11/08/2014  . Iron deficiency anemia 01/13/2019  . Left renal mass 11/08/2019  . Leukocytosis 05/28/2018  . Low vitamin B12 level 03/13/2020  . Low vitamin D level 12/12/2019  . LV dysfunction 05/31/2019  . Malignant essential hypertension 01/22/2016  . Migraine, unspecified, not intractable, without status migrainosus 11/08/2014  . Mixed hyperlipidemia 01/13/2019  . Myocardial infarction (HCC)   . Other premature beats 11/08/2014  . 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 PVD (peripheral vascular  disease) (HCC) 04/08/2017  . 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 Trochanteric bursitis of right hip 04/20/2019  . Type 2 diabetes mellitus without complications (HCC) 12/08/2013  . Vaginal atrophy 11/08/2014   Formatting of this note might be different from the original. Last Assessment & Plan:  For vaginal atrophy please place a pea size dab of Estrogen vaginal cream  into the vagina 3 times a week ( Monday, Wednesday, Friday) Last Assessment & Plan:  Formatting of this note might be different from the original. For vaginal atrophy please place a pea size dab of Estrogen vaginal cream  into the vagina     Past Surgical History:  Procedure Laterality Date  . ABDOMINAL HYSTERECTOMY    . BLEPHAROPLASTY Bilateral   . COLON SURGERY    . CORONARY ANGIOPLASTY WITH STENT PLACEMENT  2020   X3  . CORONARY STENT INTERVENTION N/A 08/03/2020   Procedure: CORONARY STENT INTERVENTION;  Surgeon: Tonny Bollman, MD;  Location: [facility] INVASIVE CV LAB;  Service: Cardiovascular;  Laterality: N/A;  . INTRAVASCULAR PRESSURE WIRE/FFR STUDY N/A 08/03/2020   Procedure: INTRAVASCULAR PRESSURE WIRE/FFR STUDY;  Surgeon: Tonny Bollman, MD;  Location: [facility] INVASIVE CV LAB;  Service: Cardiovascular;  Laterality: N/A;  . LEFT HEART CATH AND CORONARY ANGIOGRAPHY N/A 08/03/2020   Procedure: LEFT HEART CATH AND CORONARY ANGIOGRAPHY;  Surgeon: Tonny Bollman, MD;  Location: [facility] INVASIVE CV LAB;  Service: Cardiovascular;  Laterality: N/A;  .  REFRACTIVE SURGERY Left    [place]     Inpatient Medications: Scheduled Meds: . arformoterol  15 mcg Nebulization BID  . aspirin EC  81 mg Oral Daily  . carvedilol  6.25 mg Oral BID WC  . enoxaparin (LOVENOX) injection  40 mg Subcutaneous QHS  . ferrous sulfate  324 mg Oral Q breakfast  . folic acid  1 mg Oral Daily  . insulin aspart  0-15 Units Subcutaneous TID WC  . insulin glargine  25 Units Subcutaneous BID  . Ipratropium-Albuterol  1 puff Inhalation BID  . isosorbide  mononitrate  60 mg Oral Daily  . magnesium oxide  400 mg Oral Daily  . pantoprazole  40 mg Oral Daily  . potassium chloride  10 mEq Oral Daily  . pregabalin  50 mg Oral TID  . rosuvastatin  40 mg Oral Daily  . sacubitril-valsartan  1 tablet Oral BID  . sodium chloride flush  3 mL Intravenous Q12H  . spironolactone  12.5 mg Oral Daily  . ticagrelor  90 mg Oral BID  . torsemide  10 mg Oral Daily  . umeclidinium bromide  1 puff Inhalation Daily  . vitamin B-12  1,000 mcg Oral Daily  . vortioxetine HBr  5 mg Oral QHS   Continuous Infusions:  PRN Meds: acetaminophen **OR** acetaminophen, albuterol, budesonide, dicyclomine, nitroGLYCERIN, polyethylene glycol  Allergies:    Allergies  Allergen Reactions  . Bee Venom Anaphylaxis  . Sumatriptan Other (See Comments) and Shortness Of Breath    Migraine worsened  . Tramadol Hives  . Amoxicillin-Pot Clavulanate Diarrhea, Nausea And Vomiting and Other (See Comments)    Other reaction(s): GI Intolerance  . Oxycodone Itching and Hives    Other reaction(s): Other (see comments) "Funny feeling in head"  "off the edge"   . Buprenorphine Hcl     Other reaction(s): Itching  . Clarithromycin     Abdominal pain and diarrhea Abdominal pain and diarrhea Abdominal pain and diarrhea  . Duloxetine Hcl     drowsiness drowsiness drowsiness  . Hydrocodone     Other reaction(s): Itching  . Lactose   . Liraglutide     Abdominal discomfort Abdominal discomfort Abdominal discomfort  . Tramadol Hcl     Other reaction(s): Itching    Social History:   Social History   Socioeconomic History  . Marital status: Married    Spouse name: Not on file  . Number of children: Not on file  . Years of education: Not on file  . Highest education level: Not on file  Occupational History  . Not on file  Tobacco Use  . Smoking status: Former Games developermoker  . Smokeless tobacco: Never Used  Vaping Use  . Vaping Use: Never used  Substance and Sexual Activity   . Alcohol use: Never  . Drug use: Never  . Sexual activity: Not on file  Other Topics Concern  . Not on file  Social History Narrative  . Not on file   Social Determinants of Health   Financial Resource Strain: Not on file  Food Insecurity: Not on file  Transportation Needs: Not on file  Physical Activity: Not on file  Stress: Not on file  Social Connections: Not on file  Intimate Partner Violence: Not on file    Family History:  Family History  Problem Relation Age of Onset  . Lung cancer Mother   . Coronary artery disease Mother   . Diabetes Mother   . Glaucoma Mother   .  Hypertension Mother   . Migraines Father   . Diabetes Maternal Grandmother   . Diabetes Daughter   . Migraines Son      ROS:  Please see the history of present illness.  All other ROS reviewed and negative.     Physical Exam/Data:   Vitals:   12/27/20 2336 12/28/20 0502 12/28/20 0857 12/28/20 0919  BP: 103/72 107/70  97/65  Pulse: 72 86  78  Resp: 17 17  16   Temp: (!) 97.5 F (36.4 C) 98.2 F (36.8 C)  (!) 97.5 F (36.4 C)  TempSrc: Oral Oral  Oral  SpO2: 98% 98% 99% 100%  Weight:  85.5 kg    Height:       No intake or output data in the 24 hours ending 12/28/20 1222 Last 3 Weights 12/28/2020 12/26/2020 11/12/2020  Weight (lbs) 188 lb 6.4 oz 188 lb 187 lb  Weight (kg) 85.458 kg 85.276 kg 84.823 kg     Body mass index is 31.35 kg/m.  General:  Well nourished, well developed, in no acute distress HEENT: normal Lymph: no adenopathy Neck: no JVD Endocrine:  No thryomegaly Vascular: No carotid bruits; FA pulses 2+ bilaterally without bruits  Cardiac:  normal S1, S2; RRR; no murmur  Lungs:  clear to auscultation bilaterally, no wheezing, rhonchi or rales  Abd: soft, nontender, no hepatomegaly  Ext: no edema Musculoskeletal:  No deformities, BUE and BLE strength normal and equal Skin: warm and dry  Neuro:  CNs 2-12 intact, no focal abnormalities noted Psych:  Normal affect   EKG:   The EKG was personally reviewed and demonstrates: Sinus rhythm with chronic left bundle branch block and T wave inversion in inferior lateral leads Telemetry:  Telemetry was personally reviewed and demonstrates: Sinus rhythm  Relevant CV Studies: CORONARY STENT INTERVENTION   08/03/2020  INTRAVASCULAR PRESSURE WIRE/FFR STUDY  LEFT HEART CATH AND CORONARY ANGIOGRAPHY    Conclusion    Non-stenotic Mid LAD lesion.   1.  Severe single-vessel coronary artery disease with hemodynamically significant severe stenosis of the proximal LAD, treated successfully with PCI using overlapping drug-eluting stents guided by pressure wire analysis 2.  Continued patency of the distal stented segment in the LAD, but evidence of a possible chronic dissection at the distal stent edge into a small caliber apical LAD 3.  Nonobstructive left circumflex stenosis (dominant circumflex) 4.  Small, nondominant RCA with no significant stenosis 5.  Normal LVEDP  Continue aspirin and ticagrelor x12 months.  Consider discharge later today if no early complications arise.  Continue medical therapy for ischemic cardiomyopathy and coronary artery disease.  Diagnostic Dominance: Left    Intervention        Laboratory Data:  High Sensitivity Troponin:   Recent Labs  Lab 12/26/20 2246  TROPONINIHS 14     Chemistry Recent Labs  Lab 12/26/20 1712 12/27/20 0259 12/28/20 0736  NA 141 140 138  K 3.8 3.7 3.7  CL 109 109 105  CO2 22 23 23   GLUCOSE 127* 204* 105*  BUN 18 16 12   CREATININE 1.43* 1.34* 1.22*  CALCIUM 9.1 8.7* 9.0  GFRNONAA 39* 43* 48*  ANIONGAP 10 8 10     Recent Labs  Lab 12/27/20 0259  PROT 6.4*  ALBUMIN 3.2*  AST 17  ALT 11  ALKPHOS 46  BILITOT 0.5   Hematology Recent Labs  Lab 12/26/20 1712 12/27/20 0259 12/28/20 0736  WBC 7.2 8.3 6.9  RBC 4.23 4.03 4.14  HGB 11.6* 11.2* 11.1*  HCT 38.0 36.6 37.2  MCV 89.8 90.8 89.9  MCH 27.4 27.8 26.8  MCHC 30.5 30.6 29.8*  RDW  15.8* 15.8* 15.5  PLT 345 308 320   BNPNo results for input(s): BNP, PROBNP in the last 168 hours.  DDimer No results for input(s): DDIMER in the last 168 hours.   Radiology/Studies:  DG Chest 1 View  Result Date: 12/26/2020 CLINICAL DATA:  Chest pain EXAM: CHEST  1 VIEW COMPARISON:  12/23/2020 FINDINGS: Single frontal view of the chest demonstrates stable enlargement of the cardiac silhouette. No airspace disease, effusion, or pneumothorax. No acute bony abnormalities. IMPRESSION: 1. Stable cardiomegaly.  No acute process. Electronically Signed   By: Sharlet Salina M.D.   On: 12/26/2020 21:05   ECHOCARDIOGRAM COMPLETE  Result Date: 12/27/2020    ECHOCARDIOGRAM REPORT   Patient Name:   Karen Warner Date of Exam: 12/27/2020 Medical Rec #:  [id number]                 Height:       65.0 in Accession #:    [id number]                Weight:       188.0 lb Date of Birth:  October 27, 1951                 BSA:          1.927 m Patient Age:    69 years                  BP:           72/61 mmHg Patient Gender: F                         HR:           77 bpm. Exam Location:  Inpatient Procedure: 2D Echo, Cardiac Doppler and Color Doppler Indications:    R55 Syncope  History:        Patient has no prior history of Echocardiogram examinations.                 CHF, CAD and Previous Myocardial Infarction, COPD; Risk                 Factors:Hypertension, Diabetes and Dyslipidemia.  Sonographer:    Elmarie Shiley Dance Referring Phys: [id number] Cecille Po MELVIN IMPRESSIONS  1. Left ventricular ejection fraction, by estimation, is 20 to 25%. The left ventricle has severely decreased function. The left ventricle demonstrates global hypokinesis. The left ventricular internal cavity size was mildly dilated. Left ventricular diastolic parameters are consistent with Grade I diastolic dysfunction (impaired relaxation).  2. Right ventricular systolic function is normal. The right ventricular size is normal. Tricuspid  regurgitation signal is inadequate for assessing PA pressure.  3. The mitral valve is normal in structure. Trivial mitral valve regurgitation. No evidence of mitral stenosis.  4. The aortic valve is tricuspid. Aortic valve regurgitation is not visualized. No aortic stenosis is present.  5. The inferior vena cava is normal in size with greater than 50% respiratory variability, suggesting right atrial pressure of 3 mmHg. FINDINGS  Left Ventricle: Left ventricular ejection fraction, by estimation, is 20 to 25%. The left ventricle has severely decreased function. The left ventricle demonstrates global hypokinesis. The left ventricular internal cavity size was mildly dilated. There is no left ventricular hypertrophy. Left ventricular diastolic parameters are consistent with Grade I diastolic dysfunction (impaired  relaxation). Right Ventricle: The right ventricular size is normal. No increase in right ventricular wall thickness. Right ventricular systolic function is normal. Tricuspid regurgitation signal is inadequate for assessing PA pressure. Left Atrium: Left atrial size was normal in size. Right Atrium: Right atrial size was normal in size. Pericardium: There is no evidence of pericardial effusion. Mitral Valve: The mitral valve is normal in structure. Trivial mitral valve regurgitation. No evidence of mitral valve stenosis. Tricuspid Valve: The tricuspid valve is normal in structure. Tricuspid valve regurgitation is not demonstrated. Aortic Valve: The aortic valve is tricuspid. Aortic valve regurgitation is not visualized. No aortic stenosis is present. Pulmonic Valve: The pulmonic valve was normal in structure. Pulmonic valve regurgitation is not visualized. Aorta: The aortic root is normal in size and structure. Venous: The inferior vena cava is normal in size with greater than 50% respiratory variability, suggesting right atrial pressure of 3 mmHg. IAS/Shunts: No atrial level shunt detected by color flow Doppler.   LEFT VENTRICLE PLAX 2D LVOT diam:     1.70 cm  Diastology LV SV:         33       LV e' medial:    4.13 cm/s LV SV Index:   17       LV E/e' medial:  21.6 LVOT Area:     2.27 cm LV e' lateral:   4.03 cm/s                         LV E/e' lateral: 22.1  RIGHT VENTRICLE             IVC RV Basal diam:  2.50 cm     IVC diam: 1.70 cm RV S prime:     12.60 cm/s TAPSE (M-mode): 1.6 cm LEFT ATRIUM             Index       RIGHT ATRIUM           Index LA Vol (A2C):   54.4 ml 28.23 ml/m RA Area:     13.20 cm LA Vol (A4C):   38.0 ml 19.72 ml/m RA Volume:   33.40 ml  17.33 ml/m LA Biplane Vol: 46.7 ml 24.24 ml/m  AORTIC VALVE LVOT Vmax:   80.80 cm/s LVOT Vmean:  58.400 cm/s LVOT VTI:    0.145 m  AORTA Ao Root diam: 2.90 cm Ao Asc diam:  3.10 cm MITRAL VALVE MV Area (PHT): 5.27 cm     SHUNTS MV Decel Time: 144 msec     Systemic VTI:  0.14 m MV E velocity: 89.20 cm/s   Systemic Diam: 1.70 cm MV A velocity: 115.00 cm/s MV E/A ratio:  0.78 Marca Ancona MD Electronically signed by Marca Ancona MD Signature Date/Time: 12/27/2020/6:23:47 PM    Final      Assessment and Plan:   1. Recurrent syncope - Patient with 3 to 4 months history of recurrent syncope.  No prodromal symptoms each time.  Previously felt vasovagal mediated and dehydration (evaluated at [facility], no records available for review).  No mention of syncope on Dr. Mallory Shirk note.  Each episode happened while sitting.  No seizure-like activity.  Her blood pressure is soft here but not positive for orthostatic.  -Patient has pending sleep study and reports daytime tiredness.  Suspect her symptoms could be falling asleep.  However, she does have cardiac problem which may lead to syncope.  -No arrhythmia on telemetry. -Blood pressure soft on advanced  heart failure regimen. ?  Chronic soft blood pressure leading to fatigue. -Could consider monitor at discharge versus ICD with CRT-D given LBBB (see below).  2.  Ischemic cardiomyopathy/chronic combined systolic  and diastolic heart failure - Hx of CAD s/p DES to LAD in 2020. LVEF was 40-45% at that time.  LVEF of 25-30% by echo 05/2020.  - Last cardiac catheterization September 2021 in setting of non-STEMI showed severe stenosis of proximal LAD s/p overlapping DES guided by pressure wire analysis.Continued patency of the distal stented segment in the LAD, but evidence of a possible chronic dissection at the distal stent edge into a small caliber apical LAD.  -Echo this admission showed depressed LV function at 20 to 25%, grade 1 diastolic dysfunction.  Normal RV function. -Her EF remains low despite guideline directed medical therapy to 6.25 mg twice daily, spironolactone 12.5 mg daily, Entresto 24/26 mg twice daily and Farxiga. She does have indications for CRT-D -Appears euvolemic on exam -Not sure why patient on Lasix and torsemide both at home - Will plan to simplify her medication.   3.  CAD  -Last cath September 2021 as above with concern for possible chronic dissection of distal LAD. -Patient has recurrent chest pain requiring nitroglycerin 1 every week or 2. -She is treated with dual antiplatelet therapy aspirin and Brilinta; Imdur; beta-blocker and statin.  4. Acute on presumed CKD III - Serum creatinine 1.43>>1.34>>1.22 (baseline creatinine 1.1-1.2)  5. DM - Per primary team     For questions or updates, please contact [facility] Please consult [email or web address] for contact info under    Lorelei Pont, [place]  12/28/2020 12:22 PM

## 2020-12-28 NOTE — Progress Notes (Signed)
PROGRESS NOTE    Karen Warner  OIN:[id number]  DOB: 03/23/1951  DOA: 12/26/2020 PCP: Laurel Dimmer, FNP Outpatient Specialists:   Hospital course:  [name] is a 70 y.o. female with medical history significant of since her history CAD, hyperlipidemia, PVD, back pain, fibromyalgia, neuropathy, COPD, obesity, GERD, CHF, depression, diabetes, diverticulosis, glaucoma, hypertension, left bundle branch block, anemia, Luetscher syndrome (mineralocorticoid excess), migraines, orthostatic hypotension, syncope, CKD was sent from her PCP/cardiologist office for recurrent syncope and orthostatic hypotension.   Subjective:  Patient is very appreciative of cardiology consultation done earlier today.  She understands she is to have a defibrillator placed and they are going to change her medications around.  She states she feels relieved that there is a plan in place to help her with her recurrent syncope.  Objective: Vitals:   12/28/20 0502 12/28/20 0857 12/28/20 0919 12/28/20 1647  BP: 107/70  97/65 94/64  Pulse: 86  78 85  Resp: 17  16 17   Temp: 98.2 F (36.8 C)  (!) 97.5 F (36.4 C) (!) 97.5 F (36.4 C)  TempSrc: Oral  Oral Oral  SpO2: 98% 99% 100% 100%  Weight: 85.5 kg     Height:       No intake or output data in the 24 hours ending 12/28/20 1653 Filed Weights   12/26/20 2300 12/28/20 0502  Weight: 85.3 kg 85.5 kg     Exam:  General: Appears much better than yesterday, sitting on side of bed chatting with her husband.   Eyes: sclera anicteric, conjuctiva mild injection bilaterally CVS: S1-S2, regular  Respiratory:  decreased air entry bilaterally secondary to decreased inspiratory effort, rales at bases  GI: NABS, soft, NT  LE: No edema.  Neuro: A/O x 3, Moving all extremities equally with normal strength, CN 3-12 intact, grossly nonfocal.  Psych: patient is logical and coherent, judgement and insight appear normal, mood and affect  appropriate to situation.   Assessment & Plan:   Recurrent syncope with orthostatic changes Very much appreciate cardiology consultation Plan is for CRT-D on Monday per cardiology Cardiology is also concerned about her relative hypotension and has discontinued all of her cardiac medications including carvedilol, Entresto, spironolactone and torsemide. Plan is to add back one medication at a time starting with carvedilol followed by Entresto. I am continuing to hold Marcelline Deist has episodes of syncope coincide with initiation of medicine and this may be causing some diuresis intravascular volume depletion.  HFrEF Will need to follow exam closely as etiologies are holding all of her medications including carvedilol, Entresto, spironolactone and torsemide.  As noted above.  HTN Medications were discontinued as noted above Cardiology to add back medications as tolerated by BP.  DM2 Blood sugars under reasonable control on present regimen Farxiga being held  CAD/PVD No acute issues  Continue aspirin, Brilinta and rosuvastatin Imdur has been discontinued by cardiology  COPD No wheezes on exam Continue home inhaled bronchodilators  Anxiety depression Continue home Trintellix    DVT prophylaxis: Lovenox Code Status: Full Family Communication: None Disposition Plan:   Patient is from: Home  Anticipated Discharge Location: Home  Barriers to Discharge: Still getting worked up for orthostatic syncope  Is patient medically stable for Discharge: No   Consultants:  Cardiology  EP  Procedures:  Plan is for CRT-D on Monday.  Antimicrobials:  None   Data Reviewed:  Basic Metabolic Panel: Recent Labs  Lab 12/26/20 1712 12/26/20 2200 12/27/20 0259 12/28/20 0736  NA 141  --  140 138  K 3.8  --  3.7 3.7  CL 109  --  109 105  CO2 22  --  23 23  GLUCOSE 127*  --  204* 105*  BUN 18  --  16 12  CREATININE 1.43*  --  1.34* 1.22*  CALCIUM 9.1  --  8.7* 9.0  MG  --  2.3   --  2.0   Liver Function Tests: Recent Labs  Lab 12/27/20 0259  AST 17  ALT 11  ALKPHOS 46  BILITOT 0.5  PROT 6.4*  ALBUMIN 3.2*   No results for input(s): LIPASE, AMYLASE in the last 168 hours. No results for input(s): AMMONIA in the last 168 hours. CBC: Recent Labs  Lab 12/26/20 1712 12/27/20 0259 12/28/20 0736  WBC 7.2 8.3 6.9  HGB 11.6* 11.2* 11.1*  HCT 38.0 36.6 37.2  MCV 89.8 90.8 89.9  PLT 345 308 320   Cardiac Enzymes: No results for input(s): CKTOTAL, CKMB, CKMBINDEX, TROPONINI in the last 168 hours. BNP (last 3 results) No results for input(s): PROBNP in the last 8760 hours. CBG: Recent Labs  Lab 12/27/20 1834 12/27/20 2128 12/28/20 0603 12/28/20 1117 12/28/20 1615  GLUCAP 113* 225* 101* 165* 102*    Recent Results (from the past 240 hour(s))  SARS Coronavirus 2 by RT PCR (hospital order, performed in [facility] hospital lab) Nasopharyngeal Nasopharyngeal Swab     Status: None   Collection Time: 12/26/20  9:40 PM   Specimen: Nasopharyngeal Swab  Result Value Ref Range Status   SARS Coronavirus 2 NEGATIVE NEGATIVE Final    Comment: (NOTE) SARS-CoV-2 target nucleic acids are NOT DETECTED.  The SARS-CoV-2 RNA is generally detectable in upper and lower respiratory specimens during the acute phase of infection. The lowest concentration of SARS-CoV-2 viral copies this assay can detect is 250 copies / mL. A negative result does not preclude SARS-CoV-2 infection and should not be used as the sole basis for treatment or other patient management decisions.  A negative result may occur with improper specimen collection / handling, submission of specimen other than nasopharyngeal swab, presence of viral mutation(s) within the areas targeted by this assay, and inadequate number of viral copies (<250 copies / mL). A negative result must be combined with clinical observations, patient history, and epidemiological information.  Fact Sheet for Patients:    BoilerBrush.com.cy  Fact Sheet for Healthcare Providers: https://pope.com/  This test is not yet approved or  cleared by the [place] FDA and has been authorized for detection and/or diagnosis of SARS-CoV-2 by FDA under an Emergency Use Authorization (EUA).  This EUA will remain in effect (meaning this test can be used) for the duration of the COVID-19 declaration under Section 564(b)(1) of the Act, 21 U.S.C. section 360bbb-3(b)(1), unless the authorization is terminated or revoked sooner.  Performed at [facility], [address], [place], [place] [address]       Studies: DG Chest 1 View  Result Date: 12/26/2020 CLINICAL DATA:  Chest pain EXAM: CHEST  1 VIEW COMPARISON:  12/23/2020 FINDINGS: Single frontal view of the chest demonstrates stable enlargement of the cardiac silhouette. No airspace disease, effusion, or pneumothorax. No acute bony abnormalities. IMPRESSION: 1. Stable cardiomegaly.  No acute process. Electronically Signed   By: Sharlet Salina M.D.   On: 12/26/2020 21:05   ECHOCARDIOGRAM COMPLETE  Result Date: 12/27/2020    ECHOCARDIOGRAM REPORT   Patient Name:   Karen Warner Date of Exam: 12/27/2020 Medical Rec #:  [id number]                 Height:       65.0 in Accession #:    [id number]                Weight:       188.0 lb Date of Birth:  June 22, 1951                 BSA:          1.927 m Patient Age:    69 years                  BP:           72/61 mmHg Patient Gender: F                         HR:           77 bpm. Exam Location:  Inpatient Procedure: 2D Echo, Cardiac Doppler and Color Doppler Indications:    R55 Syncope  History:        Patient has no prior history of Echocardiogram examinations.                 CHF, CAD and Previous Myocardial Infarction, COPD; Risk                 Factors:Hypertension, Diabetes and Dyslipidemia.  Sonographer:    Elmarie Shiley Dance Referring Phys: [id number] Cecille Po MELVIN  IMPRESSIONS  1. Left ventricular ejection fraction, by estimation, is 20 to 25%. The left ventricle has severely decreased function. The left ventricle demonstrates global hypokinesis. The left ventricular internal cavity size was mildly dilated. Left ventricular diastolic parameters are consistent with Grade I diastolic dysfunction (impaired relaxation).  2. Right ventricular systolic function is normal. The right ventricular size is normal. Tricuspid regurgitation signal is inadequate for assessing PA pressure.  3. The mitral valve is normal in structure. Trivial mitral valve regurgitation. No evidence of mitral stenosis.  4. The aortic valve is tricuspid. Aortic valve regurgitation is not visualized. No aortic stenosis is present.  5. The inferior vena cava is normal in size with greater than 50% respiratory variability, suggesting right atrial pressure of 3 mmHg. FINDINGS  Left Ventricle: Left ventricular ejection fraction, by estimation, is 20 to 25%. The left ventricle has severely decreased function. The left ventricle demonstrates global hypokinesis. The left ventricular internal cavity size was mildly dilated. There is no left ventricular hypertrophy. Left ventricular diastolic parameters are consistent with Grade I diastolic dysfunction (impaired relaxation). Right Ventricle: The right ventricular size is normal. No increase in right ventricular wall thickness. Right ventricular systolic function is normal. Tricuspid regurgitation signal is inadequate for assessing PA pressure. Left Atrium: Left atrial size was normal in size. Right Atrium: Right atrial size was normal in size. Pericardium: There is no evidence of pericardial effusion. Mitral Valve: The mitral valve is normal in structure. Trivial mitral valve regurgitation. No evidence of mitral valve stenosis. Tricuspid Valve: The tricuspid valve is normal in structure. Tricuspid valve regurgitation is not demonstrated. Aortic Valve: The aortic valve is  tricuspid. Aortic valve regurgitation is not visualized. No aortic stenosis is present. Pulmonic Valve: The pulmonic valve was normal in structure. Pulmonic valve regurgitation is not visualized. Aorta: The aortic root is normal in size and structure. Venous: The inferior vena cava is normal in size with greater than 50% respiratory variability, suggesting right atrial pressure of 3 mmHg.  IAS/Shunts: No atrial level shunt detected by color flow Doppler.  LEFT VENTRICLE PLAX 2D LVOT diam:     1.70 cm  Diastology LV SV:         33       LV e' medial:    4.13 cm/s LV SV Index:   17       LV E/e' medial:  21.6 LVOT Area:     2.27 cm LV e' lateral:   4.03 cm/s                         LV E/e' lateral: 22.1  RIGHT VENTRICLE             IVC RV Basal diam:  2.50 cm     IVC diam: 1.70 cm RV S prime:     12.60 cm/s TAPSE (M-mode): 1.6 cm LEFT ATRIUM             Index       RIGHT ATRIUM           Index LA Vol (A2C):   54.4 ml 28.23 ml/m RA Area:     13.20 cm LA Vol (A4C):   38.0 ml 19.72 ml/m RA Volume:   33.40 ml  17.33 ml/m LA Biplane Vol: 46.7 ml 24.24 ml/m  AORTIC VALVE LVOT Vmax:   80.80 cm/s LVOT Vmean:  58.400 cm/s LVOT VTI:    0.145 m  AORTA Ao Root diam: 2.90 cm Ao Asc diam:  3.10 cm MITRAL VALVE MV Area (PHT): 5.27 cm     SHUNTS MV Decel Time: 144 msec     Systemic VTI:  0.14 m MV E velocity: 89.20 cm/s   Systemic Diam: 1.70 cm MV A velocity: 115.00 cm/s MV E/A ratio:  0.78 Marca Anconaalton Mclean MD Electronically signed by Marca Anconaalton Mclean MD Signature Date/Time: 12/27/2020/6:23:47 PM    Final      Scheduled Meds: . arformoterol  15 mcg Nebulization BID  . aspirin EC  81 mg Oral Daily  . enoxaparin (LOVENOX) injection  40 mg Subcutaneous QHS  . ferrous sulfate  324 mg Oral Q breakfast  . folic acid  1 mg Oral Daily  . insulin aspart  0-15 Units Subcutaneous TID WC  . insulin glargine  25 Units Subcutaneous BID  . Ipratropium-Albuterol  1 puff Inhalation BID  . magnesium oxide  400 mg Oral Daily  .  pantoprazole  40 mg Oral Daily  . pregabalin  50 mg Oral TID  . rosuvastatin  40 mg Oral Daily  . sodium chloride flush  3 mL Intravenous Q12H  . ticagrelor  90 mg Oral BID  . umeclidinium bromide  1 puff Inhalation Daily  . vitamin B-12  1,000 mcg Oral Daily  . vortioxetine HBr  5 mg Oral QHS   Continuous Infusions:  Principal Problem:   Syncope and collapse Active Problems:   Chronic bilateral low back pain with bilateral sciatica   Chronic obstructive pulmonary disease, unspecified (HCC)   Depression   Diabetic polyneuropathy associated with type 2 diabetes mellitus (HCC)   Fibromyalgia affecting multiple sites   Gastro-esophageal reflux disease without esophagitis   History of anemia   Low hemoglobin   Hypertension   PVD (peripheral vascular disease) (HCC)   Migraine, unspecified, not intractable, without status migrainosus   Mixed hyperlipidemia   Moderate episode of recurrent major depressive disorder (HCC)   Type 2 diabetes mellitus without complications (HCC)   Hypertensive heart disease  with heart failure (HCC)   Coronary artery disease   Depressed left ventricular ejection fraction   Luetscher's syndrome   Orthostatic hypotension   Syncope     Clotine Heiner Orma Flaming, [facility]  If 7PM-7AM, please contact night-coverage [email or web address] Password [email or web address] 12/28/2020, 4:53 PM    LOS: 1 day

## 2020-12-28 NOTE — Telephone Encounter (Signed)
No PA Required °

## 2020-12-29 ENCOUNTER — Inpatient Hospital Stay (HOSPITAL_COMMUNITY): Payer: Medicare Other

## 2020-12-29 ENCOUNTER — Other Ambulatory Visit: Payer: Self-pay

## 2020-12-29 DIAGNOSIS — I2510 Atherosclerotic heart disease of native coronary artery without angina pectoris: Secondary | ICD-10-CM | POA: Diagnosis not present

## 2020-12-29 DIAGNOSIS — R55 Syncope and collapse: Secondary | ICD-10-CM | POA: Diagnosis not present

## 2020-12-29 DIAGNOSIS — I5022 Chronic systolic (congestive) heart failure: Secondary | ICD-10-CM | POA: Diagnosis not present

## 2020-12-29 LAB — BASIC METABOLIC PANEL
Anion gap: 8 (ref 5–15)
BUN: 14 mg/dL (ref 8–23)
CO2: 24 mmol/L (ref 22–32)
Calcium: 9.2 mg/dL (ref 8.9–10.3)
Chloride: 105 mmol/L (ref 98–111)
Creatinine, Ser: 1.22 mg/dL — ABNORMAL HIGH (ref 0.44–1.00)
GFR, Estimated: 48 mL/min — ABNORMAL LOW (ref >60)
Glucose, Bld: 198 mg/dL — ABNORMAL HIGH (ref 70–99)
Potassium: 3.9 mmol/L (ref 3.5–5.1)
Sodium: 137 mmol/L (ref 135–145)

## 2020-12-29 LAB — GLUCOSE, CAPILLARY
Glucose-Capillary: 121 mg/dL — ABNORMAL HIGH (ref 70–99)
Glucose-Capillary: 148 mg/dL — ABNORMAL HIGH (ref 70–99)
Glucose-Capillary: 158 mg/dL — ABNORMAL HIGH (ref 70–99)
Glucose-Capillary: 178 mg/dL — ABNORMAL HIGH (ref 70–99)

## 2020-12-29 LAB — TROPONIN I (HIGH SENSITIVITY)
Troponin I (High Sensitivity): 15 ng/L (ref <18)
Troponin I (High Sensitivity): 15 ng/L (ref <18)

## 2020-12-29 MED ORDER — MORPHINE SULFATE (PF) 2 MG/ML IV SOLN
1.0000 mg | Freq: Once | INTRAVENOUS | Status: AC
  Administered 2020-12-29: 1 mg via INTRAVENOUS
  Filled 2020-12-29: qty 1

## 2020-12-29 MED ORDER — CLOPIDOGREL BISULFATE 75 MG PO TABS: 75.0000 mg | ORAL_TABLET | Freq: Every day | ORAL | Status: DC

## 2020-12-29 MED ORDER — CLOPIDOGREL BISULFATE 300 MG PO TABS
300.0000 mg | ORAL_TABLET | Freq: Every day | ORAL | Status: AC
  Administered 2020-12-30: 300 mg via ORAL
  Filled 2020-12-29: qty 1

## 2020-12-29 MED ORDER — CARVEDILOL 3.125 MG PO TABS
3.1250 mg | ORAL_TABLET | Freq: Two times a day (BID) | ORAL | Status: DC
  Administered 2020-12-30 – 2021-01-01 (×5): 3.125 mg via ORAL
  Filled 2020-12-29 (×6): qty 1

## 2020-12-29 MED ORDER — SALINE SPRAY 0.65 % NA SOLN
1.0000 | NASAL | Status: DC | PRN
  Administered 2020-12-30: 1 via NASAL
  Filled 2020-12-29: qty 44

## 2020-12-29 MED ORDER — CLOPIDOGREL BISULFATE 300 MG PO TABS: 300.0000 mg | ORAL_TABLET | Freq: Every day | ORAL | Status: DC

## 2020-12-29 NOTE — Progress Notes (Addendum)
HOSPITAL MEDICINE OVERNIGHT EVENT NOTE    Notified by nursing that patient is exhibiting yet another bout of sounds to be extremely atypical chest discomfort in a patient with known significant coronary disease and cardiomyopathy.  Patient has already received nitroglycerin with no improvement in symptoms.  Patient is hemodynamically stable with no evidence of shortness of breath, hypoxia or tachycardia.  EKG reviewed, left bundle branch block makes ST segment analysis difficult but there is nothing obvious.  No evidence of arrhythmias on telemetry. Serial troponins performed earlier in this hospital stay all unremarkable.   Clinically, it seems extremely unlikely the patient would be suffering from a pulmonary embolism.  Chest x-ray performed earlier today revealed no evidence of acute cardiopulmonary disease.  It seems to me this patient has known coronary disease and according to cardiology notes additionally gets frequent bouts of chest discomfort.  While patient has been on scheduled daily nitrates in the past with as needed additional short acting nitroglycerin clinical response is variable.  Temped a one-time dose of 1 mg of IV morphine and monitor for response.  Will additionally order troponin (last done done afternoon of 1/29.)  Marinda Elk  MD [facility]

## 2020-12-29 NOTE — Consult Note (Signed)
Cardiology Consultation:   Patient ID: Karen Warner MRN: [id number]; DOB: 01-03-1951  Admit date: 12/26/2020 Date of Consult: 12/29/2020  Primary Care Provider: Laurel Dimmer, FNP [facility] Cardiologist: Thomasene Ripple, DO  [facility] Electrophysiologist:  None    Patient Profile:   Hiya Point is a 70 y.o. 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  History of Present Illness:   Karen Warner is a 70 year old female who was admitted for episodes of syncope.  She has been having syncope multiple times of the last 3 to 4 months.  They occur multiple times a week.  Her symptoms have occurred while sitting.  She has no prodrome.  She has woken up at one point and found her insulin needle stuck in her abdomen.  On the day of admission she had 3 episodes of syncope.  At first was at breakfast, the second was taking the pills on her couch and the third was at her computer table.  She denied any prodromal symptoms.  She called the cardiology office and was referred to the emergency room.  She does state that she went to her primary care's office and was found to be orthostatic.  Since being in the hospital, she has had no further issues.  She has not had any more syncopal episodes.  Telemetry shows sinus rhythm.   Past Medical History:  Diagnosis Date  . Arthralgia 01/14/2020  . Atherosclerotic heart disease of native coronary artery without angina pectoris 04/13/2014  . CHF (congestive heart failure) (HCC)   . Chronic bilateral low back pain with bilateral sciatica 01/13/2019  . Chronic bladder pain 11/08/2014   Last Assessment & Plan:  Formatting of this note might be different from the original. Patient has chronic  pain syndrome in general I think this is unfortunately worsening her pelvic pain and bladder pain her examination today was very reassuring I am advised her to use the estrogen cream I did start her on Elavil 10 milligrams at that time if she does tolerated Tiger Spieker increase it to 25 milligrams.   . Chronic idiopathic constipation 12/22/2014   Formatting of this note might be different from the original. Last Assessment & Plan:  For better bowel emptying please use either Citrucel / Benefiber start with 2 tablespoons daily and titrate up or down to effect.  Also please use glycerin suppositories as needed to assist in evacuation. Last Assessment & Plan:  Formatting of this note might be different from the original. For better bowel empt  . Chronic obstructive pulmonary disease, unspecified (HCC) 03/18/2018  . Class 1 obesity due to excess calories with serious comorbidity and body mass index (BMI) of 31.0 to 31.9 in adult 06/26/2020  . Coronary artery disease   . DDD (degenerative disc disease), cervical 01/13/2019  . Depression 11/05/2019  . Diabetic polyneuropathy associated with type 2 diabetes mellitus (HCC) 02/24/2019  . Diverticulitis 05/20/2018  . Diverticulitis of colon 03/18/2018  . Family history of ischemic heart disease (IHD) 08/16/2013  . Fibromyalgia affecting multiple sites 01/14/2020  . Gastro-esophageal reflux disease without esophagitis 01/13/2019  . Glaucoma 11/08/2014  . History of anemia 12/12/2019  . Hordeolum externum of left upper eyelid 03/13/2020  . Hypertension 11/08/2014  . Iron deficiency anemia 01/13/2019  . Left renal mass  11/08/2019  . Leukocytosis 05/28/2018  . Low vitamin B12 level 03/13/2020  . Low vitamin D level 12/12/2019  . LV dysfunction 05/31/2019  . Malignant essential hypertension 01/22/2016  . Migraine, unspecified, not intractable, without status migrainosus 11/08/2014  . Mixed hyperlipidemia 01/13/2019  . Myocardial infarction (HCC)   . Other premature beats 11/08/2014   . 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 PVD (peripheral vascular disease) (HCC) 04/08/2017  . 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 Trochanteric bursitis of right hip 04/20/2019  . Type 2 diabetes mellitus without complications (HCC) 12/08/2013  . Vaginal atrophy 11/08/2014   Formatting of this note might be different from the original. Last Assessment & Plan:  For vaginal atrophy please place a pea size dab of Estrogen vaginal cream  into the vagina 3 times a week ( Monday, Wednesday, Friday) Last Assessment & Plan:  Formatting of this note might be different from the original. For vaginal atrophy please place a pea size dab of Estrogen vaginal cream  into the vagina     Past Surgical History:  Procedure Laterality Date  . ABDOMINAL HYSTERECTOMY    . BLEPHAROPLASTY Bilateral   . COLON SURGERY    . CORONARY ANGIOPLASTY WITH STENT PLACEMENT  2020   X3  . CORONARY STENT INTERVENTION N/A 08/03/2020   Procedure: CORONARY STENT INTERVENTION;  Surgeon: Tonny Bollman, MD;  Location: [facility] INVASIVE CV LAB;  Service: Cardiovascular;  Laterality: N/A;  . INTRAVASCULAR PRESSURE WIRE/FFR STUDY N/A 08/03/2020   Procedure: INTRAVASCULAR PRESSURE WIRE/FFR STUDY;  Surgeon: Tonny Bollman, MD;  Location: [facility] INVASIVE CV LAB;  Service: Cardiovascular;  Laterality: N/A;  . LEFT HEART CATH AND CORONARY ANGIOGRAPHY N/A 08/03/2020   Procedure: LEFT HEART CATH AND CORONARY ANGIOGRAPHY;  Surgeon: Tonny Bollman, MD;  Location: [facility] INVASIVE CV LAB;  Service: Cardiovascular;  Laterality: N/A;  . REFRACTIVE SURGERY Left    [place]     Home Medications:  Prior to Admission medications   Medication Sig Start Date End Date Taking? Authorizing Provider  albuterol (PROVENTIL) (2.5 MG/3ML) 0.083% nebulizer solution INL CONTENTS OF 1 VIAL VIA NEBULIZER Q 4 H PRF WHEEZING OR COUGH 01/26/18  Yes [provider]  albuterol (VENTOLIN HFA) 108 (90 Base) MCG/ACT inhaler Inhale 2 puffs  into the lungs every 6 (six) hours as needed. 01/26/18  Yes [provider]  aspirin 81 MG EC tablet 81 mg daily.   Yes [provider]  baclofen (LIORESAL) 10 MG tablet Take 10 mg by mouth 2 (two) times daily. 06/29/17  Yes [provider]  carvedilol (COREG) 6.25 MG tablet Take 1 tablet (6.25 mg total) by mouth 2 (two) times daily with a meal. 12/25/20  Yes Tobb, Kardie, DO  COMBIVENT RESPIMAT 20-100 MCG/ACT AERS respimat Inhale 1 puff into the lungs 2 (two) times daily. 12/21/20  Yes [provider]  dapagliflozin propanediol (FARXIGA) 10 MG TABS tablet Take 1 tablet (10 mg total) by mouth daily before breakfast. 11/12/20  Yes Tobb, Kardie, DO  diclofenac Sodium (VOLTAREN) 1 % GEL Apply 2 g topically 2 (two) times daily as needed. 06/29/20  Yes [provider]  dicyclomine (BENTYL) 10 MG capsule Take 10 mg by mouth every 6 (six) hours as needed for spasms.   Yes [provider]  ENTRESTO 24-26 MG TAKE 1 TABLET BY MOUTH TWICE DAILY 10/04/20  Yes Tobb, Kardie, DO  esomeprazole (NEXIUM) 40 MG capsule Take 40 mg by mouth daily at 12 noon.  Yes [provider]  famotidine (PEPCID) 40 MG tablet Take 40 mg by mouth at bedtime.   Yes [provider]  ferrous sulfate 324 MG TBEC Take 324 mg by mouth daily with breakfast.   Yes [provider]  folic acid (FOLVITE) 1 MG tablet Take 1 mg by mouth daily.   Yes [provider]  insulin lispro (HUMALOG) 100 UNIT/ML KwikPen Inject 16 Units into the skin 3 (three) times daily. Administer 16 units three times daily before a meal per sliding scale 09/22/16  Yes [provider]  isosorbide mononitrate (IMDUR) 60 MG 24 hr tablet Take 1 tablet (60 mg total) by mouth daily. 11/12/20 02/10/21 Yes Tobb, Kardie, DO  loratadine (CLARITIN) 10 MG tablet Take 10 mg by mouth daily. 09/19/20  Yes [provider]  magnesium oxide (MAG-OX) 400 MG tablet Take 400 mg by mouth  daily. 12/11/20  Yes [provider]  potassium chloride (KLOR-CON) 10 MEQ tablet Take 10 mEq by mouth daily. 10/07/20  Yes [provider]  pregabalin (LYRICA) 50 MG capsule Take 50 mg by mouth 3 (three) times daily.   Yes [provider]  rosuvastatin (CRESTOR) 40 MG tablet Take 40 mg by mouth daily.   Yes [provider]  SEREVENT DISKUS 50 MCG/DOSE diskus inhaler Inhale 1 puff into the lungs 2 (two) times daily. 12/22/20  Yes [provider]  SPIRIVA RESPIMAT 2.5 MCG/ACT AERS SMARTSIG:2 Puff(s) Via Inhaler Daily 12/22/20  Yes [provider]  spironolactone (ALDACTONE) 25 MG tablet Take 0.5 tablets (12.5 mg total) by mouth daily. 09/04/20  Yes Tobb, Kardie, DO  ticagrelor (BRILINTA) 90 MG TABS tablet Take 1 tablet (90 mg total) by mouth in the morning and at bedtime. 09/04/20 09/04/21 Yes Tobb, Kardie, DO  torsemide (DEMADEX) 20 MG tablet Take 0.5 tablets (10 mg total) by mouth daily. 11/26/20  Yes Tobb, Kardie, DO  TRESIBA FLEXTOUCH 200 UNIT/ML FlexTouch Pen Inject 40 Units into the skin 2 (two) times daily. 06/29/20  Yes [provider]  TRINTELLIX 5 MG TABS tablet Take 5 mg by mouth at bedtime. 08/14/20  Yes [provider]  vitamin B-12 (CYANOCOBALAMIN) 1000 MCG tablet Take 1,000 mcg by mouth daily.   Yes [provider]  Vitamin D, Ergocalciferol, (DRISDOL) 1.25 MG (50000 UNIT) CAPS capsule Take 50,000 Units by mouth once a week. sundays 12/25/20  Yes [provider]  WAL-DRYL ALLERGY 12.5 MG/5ML liquid Take 12.5 mg by mouth daily as needed for allergies or itching. 09/16/20  Yes [provider]  acetaminophen (TYLENOL) 325 MG tablet Take 2 tablets by mouth every 6 (six) hours as needed. 08/15/19   [provider]  ACETAMINOPHEN-BUTALBITAL 50-325 MG TABS Take 1 tablet by mouth every 4 (four) hours as needed.    [provider]  budesonide (PULMICORT) 1 MG/2ML nebulizer solution Take 1 mg  by nebulization 2 (two) times daily as needed (shortness of breath).    [provider]  DULoxetine (CYMBALTA) 30 MG capsule Take 30 mg by mouth daily. Patient not taking: Reported on 12/26/2020 12/25/20   [provider]  fluticasone (FLONASE) 50 MCG/ACT nasal spray Place into both nostrils. 12/25/20   [provider]  furosemide (LASIX) 40 MG tablet Take 1 tablet (40 mg total) by mouth daily. Patient not taking: Reported on 12/26/2020 09/24/20   Tobb, Kardie, DO  latanoprost (XALATAN) 0.005 % ophthalmic solution SMARTSIG:In Eye(s) Patient not taking: Reported on 12/26/2020 12/25/20   [provider]  nitroGLYCERIN (NITROSTAT) 0.4 MG SL tablet Place 1 tablet (0.4 mg total) under the tongue every 5 (five) minutes as needed for chest pain. 08/07/20   Tobb, Kardie, DO  nystatin (MYCOSTATIN) 100000 UNIT/ML suspension Take 5 mLs by mouth 4 (four) times daily. 10/20/20   [provider]  nystatin cream (MYCOSTATIN) Apply topically. Patient not taking: Reported on 12/26/2020    [provider]  predniSONE (DELTASONE) 5 MG tablet Take by mouth. Patient not taking: Reported on 12/26/2020 08/31/20   [provider]  SYMBICORT 80-4.5 MCG/ACT inhaler SMARTSIG:2 Puff(s) By Mouth Twice Daily Patient not taking: Reported on 12/26/2020 10/07/20   [provider]  valACYclovir (VALTREX) 1000 MG tablet Take 1,000 mg by mouth 2 (two) times daily as needed (flares). 11/02/20   [provider]    Inpatient Medications: Scheduled Meds: . arformoterol  15 mcg Nebulization BID  . aspirin EC  81 mg Oral Daily  . enoxaparin (LOVENOX) injection  40 mg Subcutaneous QHS  . ferrous sulfate  324 mg Oral Q breakfast  . folic acid  1 mg Oral Daily  . insulin aspart  0-15 Units Subcutaneous TID WC  . insulin glargine  25 Units Subcutaneous BID  . Ipratropium-Albuterol  1 puff Inhalation BID  . magnesium oxide  400 mg Oral Daily  . pantoprazole  40 mg  Oral Daily  . pregabalin  50 mg Oral TID  . rosuvastatin  40 mg Oral Daily  . sodium chloride flush  3 mL Intravenous Q12H  . ticagrelor  90 mg Oral BID  . umeclidinium bromide  1 puff Inhalation Daily  . vitamin B-12  1,000 mcg Oral Daily  . vortioxetine HBr  5 mg Oral QHS   Continuous Infusions:  PRN Meds: acetaminophen **OR** acetaminophen, albuterol, budesonide, dicyclomine, nitroGLYCERIN, polyethylene glycol  Allergies:    Allergies  Allergen Reactions  . Bee Venom Anaphylaxis  . Sumatriptan Other (See Comments) and Shortness Of Breath    Migraine worsened  . Tramadol Hives  . Amoxicillin-Pot Clavulanate Diarrhea, Nausea And Vomiting and Other (See Comments)    Other reaction(s): GI Intolerance  . Oxycodone Itching and Hives    Other reaction(s): Other (see comments) "Funny feeling in head"  "off the edge"   . Buprenorphine Hcl     Other reaction(s): Itching  . Clarithromycin     Abdominal pain and diarrhea Abdominal pain and diarrhea Abdominal pain and diarrhea  . Duloxetine Hcl     drowsiness drowsiness drowsiness  . Hydrocodone     Other reaction(s): Itching  . Lactose   . Liraglutide     Abdominal discomfort Abdominal discomfort Abdominal discomfort  . Tramadol Hcl     Other reaction(s): Itching    Social History:   Social History   Socioeconomic History  . Marital status: Married    Spouse name: Not on file  . Number of children: Not on file  . Years of education: Not on file  . Highest education level: Not on file  Occupational History  . Not on file  Tobacco Use  . Smoking status: Former Games developer  . Smokeless tobacco: Never Used  Vaping Use  . Vaping Use: Never used  Substance and Sexual Activity  . Alcohol use: Never  . Drug use: Never  . Sexual activity: Not on file  Other Topics Concern  . Not on file  Social History Narrative  . Not on file   Social Determinants of Health   Financial Resource Strain: Not on  file  Food  Insecurity: Not on file  Transportation Needs: Not on file  Physical Activity: Not on file  Stress: Not on file  Social Connections: Not on file  Intimate Partner Violence: Not on file    Family History:    Family History  Problem Relation Age of Onset  . Lung cancer Mother   . Coronary artery disease Mother   . Diabetes Mother   . Glaucoma Mother   . Hypertension Mother   . Migraines Father   . Diabetes Maternal Grandmother   . Diabetes Daughter   . Migraines Son      ROS:  Please see the history of present illness.   All other ROS reviewed and negative.     Physical Exam/Data:   Vitals:   12/28/20 1647 12/28/20 2323 12/29/20 0500 12/29/20 0600  BP: 94/64 116/71  117/77  Pulse: 85 82  86  Resp: 17 18  18   Temp: (!) 97.5 F (36.4 C) 97.7 F (36.5 C)  97.9 F (36.6 C)  TempSrc: Oral Oral  Oral  SpO2: 100% 100%  100%  Weight:   87.4 kg   Height:        Intake/Output Summary (Last 24 hours) at 12/29/2020 1025 Last data filed at 12/28/2020 2130 Gross per 24 hour  Intake 120 ml  Output --  Net 120 ml   Last 3 Weights 12/29/2020 12/28/2020 12/26/2020  Weight (lbs) 192 lb 10.9 oz 188 lb 6.4 oz 188 lb  Weight (kg) 87.4 kg 85.458 kg 85.276 kg     Body mass index is 32.06 kg/m.  General:  Well nourished, well developed, in no acute distress HEENT: normal Lymph: no adenopathy Neck: no JVD Endocrine:  No thryomegaly Vascular: No carotid bruits; FA pulses 2+ bilaterally without bruits  Cardiac:  normal S1, S2; RRR; no murmur  Lungs:  clear to auscultation bilaterally, no wheezing, rhonchi or rales  Abd: soft, nontender, no hepatomegaly  Ext: no edema Musculoskeletal:  No deformities, BUE and BLE strength normal and equal Skin: warm and dry  Neuro:  CNs 2-12 intact, no focal abnormalities noted Psych:  Normal affect   EKG:  The EKG was personally reviewed and demonstrates: Sinus rhythm, atypical left bundle branch block Telemetry:  Telemetry was personally  reviewed and demonstrates: Sinus rhythm  Relevant CV Studies: TTE 12/27/2020 1. Left ventricular ejection fraction, by estimation, is 20 to 25%. The  left ventricle has severely decreased function. The left ventricle  demonstrates global hypokinesis. The left ventricular internal cavity size  was mildly dilated. Left ventricular  diastolic parameters are consistent with Grade I diastolic dysfunction  (impaired relaxation).  2. Right ventricular systolic function is normal. The right ventricular  size is normal. Tricuspid regurgitation signal is inadequate for assessing  PA pressure.  3. The mitral valve is normal in structure. Trivial mitral valve  regurgitation. No evidence of mitral stenosis.  4. The aortic valve is tricuspid. Aortic valve regurgitation is not  visualized. No aortic stenosis is present.  5. The inferior vena cava is normal in size with greater than 50%  respiratory variability, suggesting right atrial pressure of 3 mmHg.   Left heart cath 08/03/2020 1.  Severe single-vessel coronary artery disease with hemodynamically significant severe stenosis of the proximal LAD, treated successfully with PCI using overlapping drug-eluting stents guided by pressure wire analysis 2.  Continued patency of the distal stented segment in the LAD, but evidence of a possible chronic dissection at the distal stent edge into  a small caliber apical LAD 3.  Nonobstructive left circumflex stenosis (dominant circumflex) 4.  Small, nondominant RCA with no significant stenosis 5.  Normal LVEDP  Laboratory Data:  High Sensitivity Troponin:   Recent Labs  Lab 12/26/20 2246  TROPONINIHS 14     Chemistry Recent Labs  Lab 12/27/20 0259 12/28/20 0736 12/29/20 0757  NA 140 138 137  K 3.7 3.7 3.9  CL 109 105 105  CO2 23 23 24   GLUCOSE 204* 105* 198*  BUN 16 12 14   CREATININE 1.34* 1.22* 1.22*  CALCIUM 8.7* 9.0 9.2  GFRNONAA 43* 48* 48*  ANIONGAP 8 10 8     Recent Labs  Lab  12/27/20 0259  PROT 6.4*  ALBUMIN 3.2*  AST 17  ALT 11  ALKPHOS 46  BILITOT 0.5   Hematology Recent Labs  Lab 12/26/20 1712 12/27/20 0259 12/28/20 0736  WBC 7.2 8.3 6.9  RBC 4.23 4.03 4.14  HGB 11.6* 11.2* 11.1*  HCT 38.0 36.6 37.2  MCV 89.8 90.8 89.9  MCH 27.4 27.8 26.8  MCHC 30.5 30.6 29.8*  RDW 15.8* 15.8* 15.5  PLT 345 308 320   BNPNo results for input(s): BNP, PROBNP in the last 168 hours.  DDimer No results for input(s): DDIMER in the last 168 hours.   Radiology/Studies:  DG Chest 1 View  Result Date: 12/26/2020 CLINICAL DATA:  Chest pain EXAM: CHEST  1 VIEW COMPARISON:  12/23/2020 FINDINGS: Single frontal view of the chest demonstrates stable enlargement of the cardiac silhouette. No airspace disease, effusion, or pneumothorax. No acute bony abnormalities. IMPRESSION: 1. Stable cardiomegaly.  No acute process. Electronically Signed   By: Sharlet Salina M.D.   On: 12/26/2020 21:05   ECHOCARDIOGRAM COMPLETE  Result Date: 12/27/2020    ECHOCARDIOGRAM REPORT   Patient Name:   Mattilynn ANN EVANS WALTON Date of Exam: 12/27/2020 Medical Rec #:  [id number]                 Height:       65.0 in Accession #:    [id number]                Weight:       188.0 lb Date of Birth:  [birth year]                 BSA:          1.927 m Patient Age:    69 years                  BP:           72/61 mmHg Patient Gender: F                         HR:           77 bpm. Exam Location:  Inpatient Procedure: 2D Echo, Cardiac Doppler and Color Doppler Indications:    R55 Syncope  History:        Patient has no prior history of Echocardiogram examinations.                 CHF, CAD and Previous Myocardial Infarction, COPD; Risk                 Factors:Hypertension, Diabetes and Dyslipidemia.  Sonographer:    Elmarie Shiley Dance Referring Phys: [id number] Cecille Po MELVIN IMPRESSIONS  1. Left ventricular ejection fraction, by estimation, is 20 to 25%. The left ventricle has severely decreased function.  The left  ventricle demonstrates global hypokinesis. The left ventricular internal cavity size was mildly dilated. Left ventricular diastolic parameters are consistent with Grade I diastolic dysfunction (impaired relaxation).  2. Right ventricular systolic function is normal. The right ventricular size is normal. Tricuspid regurgitation signal is inadequate for assessing PA pressure.  3. The mitral valve is normal in structure. Trivial mitral valve regurgitation. No evidence of mitral stenosis.  4. The aortic valve is tricuspid. Aortic valve regurgitation is not visualized. No aortic stenosis is present.  5. The inferior vena cava is normal in size with greater than 50% respiratory variability, suggesting right atrial pressure of 3 mmHg. FINDINGS  Left Ventricle: Left ventricular ejection fraction, by estimation, is 20 to 25%. The left ventricle has severely decreased function. The left ventricle demonstrates global hypokinesis. The left ventricular internal cavity size was mildly dilated. There is no left ventricular hypertrophy. Left ventricular diastolic parameters are consistent with Grade I diastolic dysfunction (impaired relaxation). Right Ventricle: The right ventricular size is normal. No increase in right ventricular wall thickness. Right ventricular systolic function is normal. Tricuspid regurgitation signal is inadequate for assessing PA pressure. Left Atrium: Left atrial size was normal in size. Right Atrium: Right atrial size was normal in size. Pericardium: There is no evidence of pericardial effusion. Mitral Valve: The mitral valve is normal in structure. Trivial mitral valve regurgitation. No evidence of mitral valve stenosis. Tricuspid Valve: The tricuspid valve is normal in structure. Tricuspid valve regurgitation is not demonstrated. Aortic Valve: The aortic valve is tricuspid. Aortic valve regurgitation is not visualized. No aortic stenosis is present. Pulmonic Valve: The pulmonic valve was normal in  structure. Pulmonic valve regurgitation is not visualized. Aorta: The aortic root is normal in size and structure. Venous: The inferior vena cava is normal in size with greater than 50% respiratory variability, suggesting right atrial pressure of 3 mmHg. IAS/Shunts: No atrial level shunt detected by color flow Doppler.  LEFT VENTRICLE PLAX 2D LVOT diam:     1.70 cm  Diastology LV SV:         33       LV e' medial:    4.13 cm/s LV SV Index:   17       LV E/e' medial:  21.6 LVOT Area:     2.27 cm LV e' lateral:   4.03 cm/s                         LV E/e' lateral: 22.1  RIGHT VENTRICLE             IVC RV Basal diam:  2.50 cm     IVC diam: 1.70 cm RV S prime:     12.60 cm/s TAPSE (M-mode): 1.6 cm LEFT ATRIUM             Index       RIGHT ATRIUM           Index LA Vol (A2C):   54.4 ml 28.23 ml/m RA Area:     13.20 cm LA Vol (A4C):   38.0 ml 19.72 ml/m RA Volume:   33.40 ml  17.33 ml/m LA Biplane Vol: 46.7 ml 24.24 ml/m  AORTIC VALVE LVOT Vmax:   80.80 cm/s LVOT Vmean:  58.400 cm/s LVOT VTI:    0.145 m  AORTA Ao Root diam: 2.90 cm Ao Asc diam:  3.10 cm MITRAL VALVE MV Area (PHT): 5.27 cm     SHUNTS MV Decel Time:  144 msec     Systemic VTI:  0.14 m MV E velocity: 89.20 cm/s   Systemic Diam: 1.70 cm MV A velocity: 115.00 cm/s MV E/A ratio:  0.78 Marca Anconaalton Mclean MD Electronically signed by Marca Anconaalton Mclean MD Signature Date/Time: 12/27/2020/6:23:47 PM    Final      Assessment and Plan:   1. Chronic systolic heart failure due to ischemic cardiomyopathy: Ejection fraction this admission 20 to 25%.  Was previously on optimal medical therapy but these medications have been held due to her episode of syncope.  Per the patient's report, she was found to be orthostatic, though her episodes of syncope occurred while sitting.  We Dinero Chavira slowly add back her heart failure medications this admission. 2. Coronary artery disease status post LAD stents: Patient currently on aspirin and Brilinta.  If ICD would be implanted, would be  better for her to be off of Brilinta.  We Preslynn Bier switch to Plavix. 3. Recurrent syncope: Has been having episodes of syncope over the last 3 to 4 months.  These of occurred with no prodrome and have occurred while sitting.  She does have a history of orthostatic hypotension, but due to issues while sitting, this is unlikely due to orthostasis.  Is unclear to me whether or not these are due to arrhythmias, though this cannot be fully ruled out as she has not had syncope while on cardiac monitor here in the hospital.  She would likely benefit from an ICD.  She has an atypical left bundle branch block, but has had ECGs in the past with typical left bundle branch block.  She would likely need CRT-D.   Risk Assessment/Risk Scores:        New York Heart Association (NYHA) Functional Class NYHA Class II        For questions or updates, please contact [facility] Please consult [email or web address] for contact info under    Signed, Lila Lufkin Jorja LoaMartin Paralee Pendergrass, MD  12/29/2020 10:25 AM

## 2020-12-29 NOTE — Progress Notes (Signed)
PROGRESS NOTE    Karen Warner  NFA:[id number]  DOB: [birth year]  DOA: 12/26/2020 PCP: Laurel Dimmer, FNP Outpatient Specialists:   Hospital course:  Karen Warner is a 70 y.o. female with medical history significant of since her history CAD, hyperlipidemia, PVD, back pain, fibromyalgia, neuropathy, COPD, obesity, GERD, CHF, depression, diabetes, diverticulosis, glaucoma, hypertension, left bundle branch block, anemia, Luetscher syndrome (mineralocorticoid excess), migraines, orthostatic hypotension, syncope, CKD was sent from her PCP/cardiologist office for recurrent syncope and orthostatic hypotension.  Subjective: In to see patient early this AM, doing well.  At 1340 paged that patient having chest pain. At bedside, rates at 8/10, radiating to right arm. Associated dyspnea (she was sleeping), reports similar to prior MI.   Objective: Vitals:   12/28/20 1647 12/28/20 2323 12/29/20 0500 12/29/20 0600  BP: 94/64 116/71  117/77  Pulse: 85 82  86  Resp: 17 18  18   Temp: (!) 97.5 F (36.4 C) 97.7 F (36.5 C)  97.9 F (36.6 C)  TempSrc: Oral Oral  Oral  SpO2: 100% 100%  100%  Weight:   87.4 kg   Height:        Intake/Output Summary (Last 24 hours) at 12/29/2020 0742 Last data filed at 12/28/2020 2130 Gross per 24 hour  Intake 120 ml  Output --  Net 120 ml   Filed Weights   12/26/20 2300 12/28/20 0502 12/29/20 0500  Weight: 85.3 kg 85.5 kg 87.4 kg     Exam:  General: This PM laying flat, appears anxious  Eyes: sclera anicteric, conjuctiva mild injection bilaterally CVS: S1-S2, regular  Respiratory:  decreased air entry bilaterally secondary to decreased inspiratory effort, rales at bases  GI: NABS, soft, NT  LE: No edema.  Neuro: A/O x 3, Moving all extremities equally with normal strength, CN 3-12 intact, grossly nonfocal.  Psych: patient is logical and coherent, judgement and insight appear normal, mood and affect appropriate to  situation.   Assessment & Plan:   Recurrent syncope with orthostatic changes - Appreciate Cardiology and EP recommendations, planning for CRT-D  - Transition Brilinta to Plavix per EP   Atypical Chest Pain, sharp, stabbing, given history warrants evaluation, also considered anxiety, no tachycardia but could consider PE.  - EKG without changes - Troponin X2 -  Restarted carvedilol   HFrEF Will need to follow exam closely - Restarted carvedilol - Will restart Entresto, Aldactone, SGLT2i as BP allows   HTN -Holding medications as above, restarting slowly   DM2  Lantus 25 units + SS Holding home SGLT2 inhibitor   CAD/PVD No acute issues  Continue aspirin, Brilinta -> Plavis and rosuvastatin Imdur has been discontinued by cardiology  COPD No wheezes on exam Continue home inhaled bronchodilators  Anemia, chronic stable, continue iron and B12  CKD IIIA- monitor, avoid nephrotoxic agents   Anxiety depression Continue home Trintellix  DVT prophylaxis: Lovenox Code Status: Full Family Communication: None Disposition Plan:   Patient is from: Home  Anticipated Discharge Location: Home  Barriers to Discharge: Still getting worked up for orthostatic syncope  Is patient medically stable for Discharge: No   Consultants:  Cardiology  EP  Procedures:  Plan is for CRT-D on Monday 1/31 .  Antimicrobials:  None   Data Reviewed:  Basic Metabolic Panel: Recent Labs  Lab 12/26/20 1712 12/26/20 2200 12/27/20 0259 12/28/20 0736  NA 141  --  140 138  K 3.8  --  3.7 3.7  CL 109  --  109  105  CO2 22  --  23 23  GLUCOSE 127*  --  204* 105*  BUN 18  --  16 12  CREATININE 1.43*  --  1.34* 1.22*  CALCIUM 9.1  --  8.7* 9.0  MG  --  2.3  --  2.0   Liver Function Tests: Recent Labs  Lab 12/27/20 0259  AST 17  ALT 11  ALKPHOS 46  BILITOT 0.5  PROT 6.4*  ALBUMIN 3.2*   No results for input(s): LIPASE, AMYLASE in the last 168 hours. No results for input(s):  AMMONIA in the last 168 hours. CBC: Recent Labs  Lab 12/26/20 1712 12/27/20 0259 12/28/20 0736  WBC 7.2 8.3 6.9  HGB 11.6* 11.2* 11.1*  HCT 38.0 36.6 37.2  MCV 89.8 90.8 89.9  PLT 345 308 320   Cardiac Enzymes: No results for input(s): CKTOTAL, CKMB, CKMBINDEX, TROPONINI in the last 168 hours. BNP (last 3 results) No results for input(s): PROBNP in the last 8760 hours. CBG: Recent Labs  Lab 12/28/20 0603 12/28/20 1117 12/28/20 1615 12/28/20 2104 12/29/20 0604  GLUCAP 101* 165* 102* 179* 121*    Recent Results (from the past 240 hour(s))  SARS Coronavirus 2 by RT PCR (hospital order, performed in [facility] hospital lab) Nasopharyngeal Nasopharyngeal Swab     Status: None   Collection Time: 12/26/20  9:40 PM   Specimen: Nasopharyngeal Swab  Result Value Ref Range Status   SARS Coronavirus 2 NEGATIVE NEGATIVE Final    Comment: (NOTE) SARS-CoV-2 target nucleic acids are NOT DETECTED.  The SARS-CoV-2 RNA is generally detectable in upper and lower respiratory specimens during the acute phase of infection. The lowest concentration of SARS-CoV-2 viral copies this assay can detect is 250 copies / mL. A negative result does not preclude SARS-CoV-2 infection and should not be used as the sole basis for treatment or other patient management decisions.  A negative result may occur with improper specimen collection / handling, submission of specimen other than nasopharyngeal swab, presence of viral mutation(s) within the areas targeted by this assay, and inadequate number of viral copies (<250 copies / mL). A negative result must be combined with clinical observations, patient history, and epidemiological information.  Fact Sheet for Patients:   BoilerBrush.com.cy  Fact Sheet for Healthcare Providers: https://pope.com/  This test is not yet approved or  cleared by the [place] FDA and has been authorized for detection  and/or diagnosis of SARS-CoV-2 by FDA under an Emergency Use Authorization (EUA).  This EUA will remain in effect (meaning this test can be used) for the duration of the COVID-19 declaration under Section 564(b)(1) of the Act, 21 U.S.C. section 360bbb-3(b)(1), unless the authorization is terminated or revoked sooner.  Performed at [facility], [address], [place], [place] [address]       Studies: ECHOCARDIOGRAM COMPLETE  Result Date: 12/27/2020    ECHOCARDIOGRAM REPORT   Patient Name:   Gerrianne ANN Silvio Pate Date of Exam: 12/27/2020 Medical Rec #:  [id number]                 Height:       65.0 in Accession #:    [id number]                Weight:       188.0 lb Date of Birth:  12-May-1951                 BSA:  1.927 m Patient Age:    69 years                  BP:           72/61 mmHg Patient Gender: F                         HR:           77 bpm. Exam Location:  Inpatient Procedure: 2D Echo, Cardiac Doppler and Color Doppler Indications:    R55 Syncope  History:        Patient has no prior history of Echocardiogram examinations.                 CHF, CAD and Previous Myocardial Infarction, COPD; Risk                 Factors:Hypertension, Diabetes and Dyslipidemia.  Sonographer:    Elmarie Shiley Dance Referring Phys: [id number] Cecille Po MELVIN IMPRESSIONS  1. Left ventricular ejection fraction, by estimation, is 20 to 25%. The left ventricle has severely decreased function. The left ventricle demonstrates global hypokinesis. The left ventricular internal cavity size was mildly dilated. Left ventricular diastolic parameters are consistent with Grade I diastolic dysfunction (impaired relaxation).  2. Right ventricular systolic function is normal. The right ventricular size is normal. Tricuspid regurgitation signal is inadequate for assessing PA pressure.  3. The mitral valve is normal in structure. Trivial mitral valve regurgitation. No evidence of mitral stenosis.  4. The aortic valve is  tricuspid. Aortic valve regurgitation is not visualized. No aortic stenosis is present.  5. The inferior vena cava is normal in size with greater than 50% respiratory variability, suggesting right atrial pressure of 3 mmHg. FINDINGS  Left Ventricle: Left ventricular ejection fraction, by estimation, is 20 to 25%. The left ventricle has severely decreased function. The left ventricle demonstrates global hypokinesis. The left ventricular internal cavity size was mildly dilated. There is no left ventricular hypertrophy. Left ventricular diastolic parameters are consistent with Grade I diastolic dysfunction (impaired relaxation). Right Ventricle: The right ventricular size is normal. No increase in right ventricular wall thickness. Right ventricular systolic function is normal. Tricuspid regurgitation signal is inadequate for assessing PA pressure. Left Atrium: Left atrial size was normal in size. Right Atrium: Right atrial size was normal in size. Pericardium: There is no evidence of pericardial effusion. Mitral Valve: The mitral valve is normal in structure. Trivial mitral valve regurgitation. No evidence of mitral valve stenosis. Tricuspid Valve: The tricuspid valve is normal in structure. Tricuspid valve regurgitation is not demonstrated. Aortic Valve: The aortic valve is tricuspid. Aortic valve regurgitation is not visualized. No aortic stenosis is present. Pulmonic Valve: The pulmonic valve was normal in structure. Pulmonic valve regurgitation is not visualized. Aorta: The aortic root is normal in size and structure. Venous: The inferior vena cava is normal in size with greater than 50% respiratory variability, suggesting right atrial pressure of 3 mmHg. IAS/Shunts: No atrial level shunt detected by color flow Doppler.  LEFT VENTRICLE PLAX 2D LVOT diam:     1.70 cm  Diastology LV SV:         33       LV e' medial:    4.13 cm/s LV SV Index:   17       LV E/e' medial:  21.6 LVOT Area:     2.27 cm LV e' lateral:    4.03 cm/s  LV E/e' lateral: 22.1  RIGHT VENTRICLE             IVC RV Basal diam:  2.50 cm     IVC diam: 1.70 cm RV S prime:     12.60 cm/s TAPSE (M-mode): 1.6 cm LEFT ATRIUM             Index       RIGHT ATRIUM           Index LA Vol (A2C):   54.4 ml 28.23 ml/m RA Area:     13.20 cm LA Vol (A4C):   38.0 ml 19.72 ml/m RA Volume:   33.40 ml  17.33 ml/m LA Biplane Vol: 46.7 ml 24.24 ml/m  AORTIC VALVE LVOT Vmax:   80.80 cm/s LVOT Vmean:  58.400 cm/s LVOT VTI:    0.145 m  AORTA Ao Root diam: 2.90 cm Ao Asc diam:  3.10 cm MITRAL VALVE MV Area (PHT): 5.27 cm     SHUNTS MV Decel Time: 144 msec     Systemic VTI:  0.14 m MV E velocity: 89.20 cm/s   Systemic Diam: 1.70 cm MV A velocity: 115.00 cm/s MV E/A ratio:  0.78 Marca Ancona MD Electronically signed by Marca Ancona MD Signature Date/Time: 12/27/2020/6:23:47 PM    Final      Scheduled Meds: . arformoterol  15 mcg Nebulization BID  . aspirin EC  81 mg Oral Daily  . enoxaparin (LOVENOX) injection  40 mg Subcutaneous QHS  . ferrous sulfate  324 mg Oral Q breakfast  . folic acid  1 mg Oral Daily  . insulin aspart  0-15 Units Subcutaneous TID WC  . insulin glargine  25 Units Subcutaneous BID  . Ipratropium-Albuterol  1 puff Inhalation BID  . magnesium oxide  400 mg Oral Daily  . pantoprazole  40 mg Oral Daily  . pregabalin  50 mg Oral TID  . rosuvastatin  40 mg Oral Daily  . sodium chloride flush  3 mL Intravenous Q12H  . ticagrelor  90 mg Oral BID  . umeclidinium bromide  1 puff Inhalation Daily  . vitamin B-12  1,000 mcg Oral Daily  . vortioxetine HBr  5 mg Oral QHS   Continuous Infusions:  Principal Problem:   Syncope and collapse Active Problems:   Chronic bilateral low back pain with bilateral sciatica   Chronic obstructive pulmonary disease, unspecified (HCC)   Depression   Diabetic polyneuropathy associated with type 2 diabetes mellitus (HCC)   Fibromyalgia affecting multiple sites   Gastro-esophageal  reflux disease without esophagitis   History of anemia   Low hemoglobin   Hypertension   PVD (peripheral vascular disease) (HCC)   Migraine, unspecified, not intractable, without status migrainosus   Mixed hyperlipidemia   Moderate episode of recurrent major depressive disorder (HCC)   Type 2 diabetes mellitus without complications (HCC)   Hypertensive heart disease with heart failure (HCC)   Coronary artery disease   Depressed left ventricular ejection fraction   Luetscher's syndrome   Orthostatic hypotension   Syncope     Dann Ventress Bartholome Bill, [facility]  If 7PM-7AM, please contact night-coverage [email or web address] Password [email or web address] 12/29/2020, 7:42 AM    LOS: 2 days

## 2020-12-30 ENCOUNTER — Encounter (HOSPITAL_COMMUNITY): Payer: Self-pay | Admitting: Internal Medicine

## 2020-12-30 DIAGNOSIS — I951 Orthostatic hypotension: Secondary | ICD-10-CM

## 2020-12-30 DIAGNOSIS — R55 Syncope and collapse: Secondary | ICD-10-CM | POA: Diagnosis not present

## 2020-12-30 DIAGNOSIS — I2510 Atherosclerotic heart disease of native coronary artery without angina pectoris: Secondary | ICD-10-CM | POA: Diagnosis not present

## 2020-12-30 DIAGNOSIS — I5022 Chronic systolic (congestive) heart failure: Secondary | ICD-10-CM | POA: Diagnosis not present

## 2020-12-30 LAB — BASIC METABOLIC PANEL
Anion gap: 7 (ref 5–15)
BUN: 14 mg/dL (ref 8–23)
CO2: 25 mmol/L (ref 22–32)
Calcium: 8.7 mg/dL — ABNORMAL LOW (ref 8.9–10.3)
Chloride: 105 mmol/L (ref 98–111)
Creatinine, Ser: 1.16 mg/dL — ABNORMAL HIGH (ref 0.44–1.00)
GFR, Estimated: 51 mL/min — ABNORMAL LOW (ref >60)
Glucose, Bld: 252 mg/dL — ABNORMAL HIGH (ref 70–99)
Potassium: 3.8 mmol/L (ref 3.5–5.1)
Sodium: 137 mmol/L (ref 135–145)

## 2020-12-30 LAB — GLUCOSE, CAPILLARY
Glucose-Capillary: 166 mg/dL — ABNORMAL HIGH (ref 70–99)
Glucose-Capillary: 157 mg/dL — ABNORMAL HIGH (ref 70–99)
Glucose-Capillary: 180 mg/dL — ABNORMAL HIGH (ref 70–99)
Glucose-Capillary: 275 mg/dL — ABNORMAL HIGH (ref 70–99)

## 2020-12-30 LAB — CBC
HCT: 37.8 % (ref 36.0–46.0)
Hemoglobin: 11.3 g/dL — ABNORMAL LOW (ref 12.0–15.0)
MCH: 27 pg (ref 26.0–34.0)
MCHC: 29.9 g/dL — ABNORMAL LOW (ref 30.0–36.0)
MCV: 90.4 fL (ref 80.0–100.0)
Platelets: 313 10*3/uL (ref 150–400)
RBC: 4.18 MIL/uL (ref 3.87–5.11)
RDW: 15.4 % (ref 11.5–15.5)
WBC: 7.9 10*3/uL (ref 4.0–10.5)
nRBC: 0 % (ref 0.0–0.2)

## 2020-12-30 LAB — TROPONIN I (HIGH SENSITIVITY): Troponin I (High Sensitivity): 17 ng/L (ref <18)

## 2020-12-30 MED ORDER — SACUBITRIL-VALSARTAN 24-26 MG PO TABS
1.0000 | ORAL_TABLET | Freq: Two times a day (BID) | ORAL | Status: DC
  Administered 2020-12-30 – 2021-01-01 (×5): 1 via ORAL
  Filled 2020-12-30 (×6): qty 1

## 2020-12-30 MED ORDER — DICLOFENAC SODIUM 1 % EX GEL
2.0000 g | Freq: Three times a day (TID) | CUTANEOUS | Status: DC | PRN
  Administered 2020-12-30: 2 g via TOPICAL
  Filled 2020-12-30: qty 100

## 2020-12-30 NOTE — Progress Notes (Signed)
Progress Note    Karen Warner  JME:[id number] DOB: [birth year]  DOA: 12/26/2020 PCP: Laurel Dimmer, FNP      Brief Narrative:    Medical records reviewed and are as summarized below:  Karen Warner is a 70 y.o. female with medical history significant ofsince her history CAD, hyperlipidemia, PVD, back pain, fibromyalgia, neuropathy, COPD, obesity, GERD, CHF, depression, diabetes, diverticulosis, glaucoma, hypertension, left bundle branch block, anemia, Luetscher syndrome(mineralocorticoid excess),migraines, orthostatic hypotension, syncope, CKD was sent from her PCP/cardiologist office for recurrent syncope and orthostatic hypotension.      Assessment/Plan:   Principal Problem:   Syncope and collapse Active Problems:   Chronic bilateral low back pain with bilateral sciatica   Chronic obstructive pulmonary disease, unspecified (HCC)   Depression   Diabetic polyneuropathy associated with type 2 diabetes mellitus (HCC)   Fibromyalgia affecting multiple sites   Gastro-esophageal reflux disease without esophagitis   History of anemia   Low hemoglobin   Hypertension   PVD (peripheral vascular disease) (HCC)   Migraine, unspecified, not intractable, without status migrainosus   Mixed hyperlipidemia   Moderate episode of recurrent major depressive disorder (HCC)   Type 2 diabetes mellitus without complications (HCC)   Hypertensive heart disease with heart failure (HCC)   Coronary artery disease   Depressed left ventricular ejection fraction   Luetscher's syndrome   Orthostatic hypotension   Syncope    Body mass index is 31.5 kg/m.  (Obesity)    Recurrent syncope with orthostatic changes, chronic systolic CHF/ischemic cardiomyopathy, CAD, PVD, hypertension -Plan for CRT-D implant on 12/31/2020 Brilinta has been switched to Plavix.  Continue aspirin and rosuvastatin. Continue antihypertensives  Chest Pain,  Troponins were  negative. Chest pain may be due to anxiety.  DM2 Continue Lantus and NovoLog  COPD Continue bronchodilators  Other comorbidities include anxiety, depression, anemia of chronic disease, CKD stage IIIa.   Diet Order            Diet NPO time specified  Diet effective midnight           Diet heart healthy/carb modified Room service appropriate? Yes; Fluid consistency: Thin  Diet effective now                    Consultants:  Cardiologist  Procedures:  Plan for CRT placement on 12/31/2020    Medications:   . arformoterol  15 mcg Nebulization BID  . aspirin EC  81 mg Oral Daily  . carvedilol  3.125 mg Oral BID WC  . [START ON 12/31/2020] clopidogrel  75 mg Oral Daily  . enoxaparin (LOVENOX) injection  40 mg Subcutaneous QHS  . ferrous sulfate  324 mg Oral Q breakfast  . folic acid  1 mg Oral Daily  . insulin aspart  0-15 Units Subcutaneous TID WC  . insulin glargine  25 Units Subcutaneous BID  . Ipratropium-Albuterol  1 puff Inhalation BID  . magnesium oxide  400 mg Oral Daily  . pantoprazole  40 mg Oral Daily  . pregabalin  50 mg Oral TID  . rosuvastatin  40 mg Oral Daily  . sacubitril-valsartan  1 tablet Oral BID  . sodium chloride flush  3 mL Intravenous Q12H  . umeclidinium bromide  1 puff Inhalation Daily  . vitamin B-12  1,000 mcg Oral Daily  . vortioxetine HBr  5 mg Oral QHS   Continuous Infusions:   Anti-infectives (From admission, onward)   None  Family Communication/Anticipated D/C date and plan/Code Status   DVT prophylaxis: enoxaparin (LOVENOX) injection 40 mg Start: 12/26/20 2245     Code Status: Full Code  Family Communication: None Disposition Plan: None   Status is: Inpatient  Remains inpatient appropriate because:Inpatient level of care appropriate due to severity of illness   Dispo: The patient is from: Home              Anticipated d/c is to: Home              Anticipated d/c date is: 3 days               Patient currently is not medically stable to d/c.   Difficult to place patient No           Subjective:   She had chest pain last night but this is improved.  No shortness of breath.  Objective:    Vitals:   12/30/20 0532 12/30/20 0836 12/30/20 0937 12/30/20 1054  BP: 115/86 118/74  112/72  Pulse: 92 92  81  Resp: 18   17  Temp: 98.3 F (36.8 C)   97.7 F (36.5 C)  TempSrc: Oral   Oral  SpO2: 100%  99% 100%  Weight: 85.9 kg     Height:       No data found.  No intake or output data in the 24 hours ending 12/30/20 1425 Filed Weights   12/28/20 0502 12/29/20 0500 12/30/20 0532  Weight: 85.5 kg 87.4 kg 85.9 kg    Exam:  GEN: NAD SKIN: No rash EYES: EOMI ENT: MMM CV: RRR PULM: CTA B ABD: soft, obese, NT, +BS CNS: AAO x 3, non focal EXT: No edema or tenderness    Data Reviewed:   I have personally reviewed following labs and imaging studies:  Labs: Labs show the following:   Basic Metabolic Panel: Recent Labs  Lab 12/26/20 1712 12/26/20 2200 12/27/20 0259 12/28/20 0736 12/29/20 0757 12/30/20 0021  NA 141  --  140 138 137 137  K 3.8  --  3.7 3.7 3.9 3.8  CL 109  --  109 105 105 105  CO2 22  --  23 23 24 25   GLUCOSE 127*  --  204* 105* 198* 252*  BUN 18  --  16 12 14 14   CREATININE 1.43*  --  1.34* 1.22* 1.22* 1.16*  CALCIUM 9.1  --  8.7* 9.0 9.2 8.7*  MG  --  2.3  --  2.0  --   --    GFR Estimated Creatinine Clearance: 48.9 mL/min (A) (by C-G formula based on SCr of 1.16 mg/dL (H)). Liver Function Tests: Recent Labs  Lab 12/27/20 0259  AST 17  ALT 11  ALKPHOS 46  BILITOT 0.5  PROT 6.4*  ALBUMIN 3.2*   No results for input(s): LIPASE, AMYLASE in the last 168 hours. No results for input(s): AMMONIA in the last 168 hours. Coagulation profile No results for input(s): INR, PROTIME in the last 168 hours.  CBC: Recent Labs  Lab 12/26/20 1712 12/27/20 0259 12/28/20 0736 12/30/20 0021  WBC 7.2 8.3 6.9 7.9  HGB 11.6* 11.2*  11.1* 11.3*  HCT 38.0 36.6 37.2 37.8  MCV 89.8 90.8 89.9 90.4  PLT 345 308 320 313   Cardiac Enzymes: No results for input(s): CKTOTAL, CKMB, CKMBINDEX, TROPONINI in the last 168 hours. BNP (last 3 results) No results for input(s): PROBNP in the last 8760 hours. CBG: Recent Labs  Lab 12/29/20  1102 12/29/20 1604 12/29/20 2100 12/30/20 0615 12/30/20 1047  GLUCAP 148* 158* 178* 166* 157*   D-Dimer: No results for input(s): DDIMER in the last 72 hours. Hgb A1c: No results for input(s): HGBA1C in the last 72 hours. Lipid Profile: No results for input(s): CHOL, HDL, LDLCALC, TRIG, CHOLHDL, LDLDIRECT in the last 72 hours. Thyroid function studies: Recent Labs    12/28/20 0736  TSH 1.606   Anemia work up: No results for input(s): VITAMINB12, FOLATE, FERRITIN, TIBC, IRON, RETICCTPCT in the last 72 hours. Sepsis Labs: Recent Labs  Lab 12/26/20 1712 12/27/20 0259 12/28/20 0736 12/30/20 0021  WBC 7.2 8.3 6.9 7.9    Microbiology Recent Results (from the past 240 hour(s))  SARS Coronavirus 2 by RT PCR (hospital order, performed in [facility] hospital lab) Nasopharyngeal Nasopharyngeal Swab     Status: None   Collection Time: 12/26/20  9:40 PM   Specimen: Nasopharyngeal Swab  Result Value Ref Range Status   SARS Coronavirus 2 NEGATIVE NEGATIVE Final    Comment: (NOTE) SARS-CoV-2 target nucleic acids are NOT DETECTED.  The SARS-CoV-2 RNA is generally detectable in upper and lower respiratory specimens during the acute phase of infection. The lowest concentration of SARS-CoV-2 viral copies this assay can detect is 250 copies / mL. A negative result does not preclude SARS-CoV-2 infection and should not be used as the sole basis for treatment or other patient management decisions.  A negative result may occur with improper specimen collection / handling, submission of specimen other than nasopharyngeal swab, presence of viral mutation(s) within the areas targeted by this  assay, and inadequate number of viral copies (<250 copies / mL). A negative result must be combined with clinical observations, patient history, and epidemiological information.  Fact Sheet for Patients:   BoilerBrush.com.cy  Fact Sheet for Healthcare Providers: https://pope.com/  This test is not yet approved or  cleared by the [place] FDA and has been authorized for detection and/or diagnosis of SARS-CoV-2 by FDA under an Emergency Use Authorization (EUA).  This EUA will remain in effect (meaning this test can be used) for the duration of the COVID-19 declaration under Section 564(b)(1) of the Act, 21 U.S.C. section 360bbb-3(b)(1), unless the authorization is terminated or revoked sooner.  Performed at [facility], [address] [place], [place] [address]     Procedures and diagnostic studies:  DG Chest 1 View  Result Date: 12/29/2020 CLINICAL DATA:  Dyspnea.  Loss of consciousness. EXAM: CHEST  1 VIEW COMPARISON:  12/26/2020 FINDINGS: Stable enlarged cardiac silhouette and mildly tortuous aorta. Clear lungs with normal vascularity. Mild bilateral shoulder degenerative changes. IMPRESSION: No acute abnormality. Stable cardiomegaly. Electronically Signed   By: Beckie Salts M.D.   On: 12/29/2020 14:19               LOS: 3 days   Stefanie Hodgens  [facility]   Pager on [email or web address]. If 7PM-7AM, please contact night-coverage at [email or web address]     12/30/2020, 2:25 PM

## 2020-12-30 NOTE — Progress Notes (Signed)
Progress Note  Patient Name: Karen Warner Date of Encounter: 12/30/2020  [facility] Cardiologist: Thomasene Ripple, DO   Subjective   Patient feeling well this morning.  Had an episode of chest pain yesterday evening which was relieved by both nitroglycerin and morphine.  High-sensitivity troponin was 17 and ECG remained unchanged with left bundle branch block.  The patient feels that this may have been due to anxiety.  Inpatient Medications    Scheduled Meds: . arformoterol  15 mcg Nebulization BID  . aspirin EC  81 mg Oral Daily  . carvedilol  3.125 mg Oral BID WC  . [START ON 12/31/2020] clopidogrel  75 mg Oral Daily  . enoxaparin (LOVENOX) injection  40 mg Subcutaneous QHS  . ferrous sulfate  324 mg Oral Q breakfast  . folic acid  1 mg Oral Daily  . insulin aspart  0-15 Units Subcutaneous TID WC  . insulin glargine  25 Units Subcutaneous BID  . Ipratropium-Albuterol  1 puff Inhalation BID  . magnesium oxide  400 mg Oral Daily  . pantoprazole  40 mg Oral Daily  . pregabalin  50 mg Oral TID  . rosuvastatin  40 mg Oral Daily  . sacubitril-valsartan  1 tablet Oral BID  . sodium chloride flush  3 mL Intravenous Q12H  . umeclidinium bromide  1 puff Inhalation Daily  . vitamin B-12  1,000 mcg Oral Daily  . vortioxetine HBr  5 mg Oral QHS   Continuous Infusions:  PRN Meds: acetaminophen **OR** acetaminophen, albuterol, budesonide, dicyclomine, nitroGLYCERIN, polyethylene glycol, sodium chloride   Vital Signs    Vitals:   12/29/20 2328 12/30/20 0532 12/30/20 0836 12/30/20 0937  BP: 119/79 115/86 118/74   Pulse: 95 92 92   Resp: 18 18    Temp:  98.3 F (36.8 C)    TempSrc:  Oral    SpO2: 96% 100%  99%  Weight:  85.9 kg    Height:       No intake or output data in the 24 hours ending 12/30/20 0957 Last 3 Weights 12/30/2020 12/29/2020 12/28/2020  Weight (lbs) 189 lb 4.8 oz 192 lb 10.9 oz 188 lb 6.4 oz  Weight (kg) 85.866 kg 87.4 kg 85.458 kg       Telemetry    Sinus rhythm, left bundle branch block- Personally Reviewed  ECG    Sinus rhythm, left bundle branch block- Personally Reviewed  Physical Exam   GEN: No acute distress.   Neck: No JVD Cardiac: RRR, no murmurs, rubs, or gallops.  Respiratory: Clear to auscultation bilaterally. GI: Soft, nontender, non-distended  MS: No edema; No deformity. Neuro:  Nonfocal  Psych: Normal affect   Labs    High Sensitivity Troponin:   Recent Labs  Lab 12/26/20 2246 12/29/20 1411 12/29/20 1655 12/30/20 0336  TROPONINIHS 14 15 15 17       Chemistry Recent Labs  Lab 12/27/20 0259 12/28/20 0736 12/29/20 0757 12/30/20 0021  NA 140 138 137 137  K 3.7 3.7 3.9 3.8  CL 109 105 105 105  CO2 23 23 24 25   GLUCOSE 204* 105* 198* 252*  BUN 16 12 14 14   CREATININE 1.34* 1.22* 1.22* 1.16*  CALCIUM 8.7* 9.0 9.2 8.7*  PROT 6.4*  --   --   --   ALBUMIN 3.2*  --   --   --   AST 17  --   --   --   ALT 11  --   --   --  ALKPHOS 46  --   --   --   BILITOT 0.5  --   --   --   GFRNONAA 43* 48* 48* 51*  ANIONGAP 8 10 8 7      Hematology Recent Labs  Lab 12/27/20 0259 12/28/20 0736 12/30/20 0021  WBC 8.3 6.9 7.9  RBC 4.03 4.14 4.18  HGB 11.2* 11.1* 11.3*  HCT 36.6 37.2 37.8  MCV 90.8 89.9 90.4  MCH 27.8 26.8 27.0  MCHC 30.6 29.8* 29.9*  RDW 15.8* 15.5 15.4  PLT 308 320 313    BNPNo results for input(s): BNP, PROBNP in the last 168 hours.   DDimer No results for input(s): DDIMER in the last 168 hours.   Radiology    DG Chest 1 View  Result Date: 12/29/2020 CLINICAL DATA:  Dyspnea.  Loss of consciousness. EXAM: CHEST  1 VIEW COMPARISON:  12/26/2020 FINDINGS: Stable enlarged cardiac silhouette and mildly tortuous aorta. Clear lungs with normal vascularity. Mild bilateral shoulder degenerative changes. IMPRESSION: No acute abnormality. Stable cardiomegaly. Electronically Signed   By: 12/28/2020 M.D.   On: 12/29/2020 14:19    Cardiac Studies   TTE 12/28/19 1. Left  ventricular ejection fraction, by estimation, is 20 to 25%. The  left ventricle has severely decreased function. The left ventricle  demonstrates global hypokinesis. The left ventricular internal cavity size  was mildly dilated. Left ventricular  diastolic parameters are consistent with Grade I diastolic dysfunction  (impaired relaxation).  2. Right ventricular systolic function is normal. The right ventricular  size is normal. Tricuspid regurgitation signal is inadequate for assessing  PA pressure.  3. The mitral valve is normal in structure. Trivial mitral valve  regurgitation. No evidence of mitral stenosis.  4. The aortic valve is tricuspid. Aortic valve regurgitation is not  visualized. No aortic stenosis is present.  5. The inferior vena cava is normal in size with greater than 50%  respiratory variability, suggesting right atrial pressure of 3 mmHg.   Patient Profile     70 y.o. female with a history of coronary artery disease and ischemic cardiomyopathy presented to the hospital with syncope.  Assessment & Plan    Ischemic cardiomyopathy Coronary artery disease Syncope  Patient appears to be well compensated.  Due to her syncope, her heart failure medications were held.  And restarting slowly her current medications.  She is now on both Entresto and carvedilol.  She was previously on Aldactone and 66.  These can be added back as an outpatient.  Her episodes of syncope sound somewhat atypical for VT, though they occur when she is at rest, not orthostatic, and are quite sudden.  Due to that, I do think that she would benefit from CRT-D implant.  We Bonna Steury plan for tomorrow.  She did have some chest pain.  This was relieved with nitroglycerin and morphine.  The patient does feel like this was potentially due to anxiety.  Fortunately high-sensitivity troponin was negative.     For questions or updates, please contact [facility] Please consult [email or web address] for contact  info under        Signed, Nehemiah Montee [place], MD  12/30/2020, 9:57 AM

## 2020-12-31 ENCOUNTER — Inpatient Hospital Stay (HOSPITAL_COMMUNITY)
Admission: EM | Disposition: A | Discharge status: Home / Self Care | Payer: Self-pay | Source: Home / Self Care | Attending: Internal Medicine

## 2020-12-31 DIAGNOSIS — I10 Essential (primary) hypertension: Secondary | ICD-10-CM | POA: Diagnosis not present

## 2020-12-31 DIAGNOSIS — I5043 Acute on chronic combined systolic (congestive) and diastolic (congestive) heart failure: Secondary | ICD-10-CM

## 2020-12-31 DIAGNOSIS — I5022 Chronic systolic (congestive) heart failure: Secondary | ICD-10-CM

## 2020-12-31 DIAGNOSIS — R0989 Other specified symptoms and signs involving the circulatory and respiratory systems: Secondary | ICD-10-CM | POA: Diagnosis not present

## 2020-12-31 DIAGNOSIS — I255 Ischemic cardiomyopathy: Secondary | ICD-10-CM

## 2020-12-31 DIAGNOSIS — R55 Syncope and collapse: Secondary | ICD-10-CM | POA: Diagnosis not present

## 2020-12-31 DIAGNOSIS — I110 Hypertensive heart disease with heart failure: Secondary | ICD-10-CM

## 2020-12-31 DIAGNOSIS — I2510 Atherosclerotic heart disease of native coronary artery without angina pectoris: Secondary | ICD-10-CM | POA: Diagnosis not present

## 2020-12-31 HISTORY — PX: BIV ICD INSERTION CRT-D: EP1195

## 2020-12-31 LAB — SURGICAL PCR SCREEN
MRSA, PCR: NEGATIVE
Staphylococcus aureus: NEGATIVE

## 2020-12-31 LAB — GLUCOSE, CAPILLARY
Glucose-Capillary: 195 mg/dL — ABNORMAL HIGH (ref 70–99)
Glucose-Capillary: 181 mg/dL — ABNORMAL HIGH (ref 70–99)
Glucose-Capillary: 124 mg/dL — ABNORMAL HIGH (ref 70–99)
Glucose-Capillary: 261 mg/dL — ABNORMAL HIGH (ref 70–99)

## 2020-12-31 SURGERY — BIV ICD INSERTION CRT-D

## 2020-12-31 MED ORDER — SODIUM CHLORIDE 0.9 % IV SOLN: 250.0000 mL | INTRAVENOUS | Status: DC

## 2020-12-31 MED ORDER — LIDOCAINE HCL 1 % IJ SOLN
INTRAMUSCULAR | Status: AC
  Filled 2020-12-31: qty 60

## 2020-12-31 MED ORDER — MIDAZOLAM HCL 5 MG/5ML IJ SOLN
INTRAMUSCULAR | Status: DC | PRN
  Administered 2020-12-31 (×4): 1 mg via INTRAVENOUS

## 2020-12-31 MED ORDER — SODIUM CHLORIDE 0.9 % IV SOLN
INTRAVENOUS | Status: DC
Start: 2020-12-31 — End: 2020-12-31

## 2020-12-31 MED ORDER — FENTANYL CITRATE (PF) 100 MCG/2ML IJ SOLN
INTRAMUSCULAR | Status: DC | PRN
  Administered 2020-12-31: 25 ug via INTRAVENOUS
  Administered 2020-12-31 (×3): 12.5 ug via INTRAVENOUS

## 2020-12-31 MED ORDER — CHLORHEXIDINE GLUCONATE 4 % EX LIQD
60.0000 mL | Freq: Once | CUTANEOUS | Status: DC
  Filled 2020-12-31 (×2): qty 60

## 2020-12-31 MED ORDER — BUTALBITAL-APAP-CAFFEINE 50-325-40 MG PO TABS
1.0000 | ORAL_TABLET | ORAL | Status: DC | PRN
  Administered 2020-12-31: 1 via ORAL
  Filled 2020-12-31: qty 1

## 2020-12-31 MED ORDER — HEPARIN (PORCINE) IN NACL 1000-0.9 UT/500ML-% IV SOLN
INTRAVENOUS | Status: DC | PRN
  Administered 2020-12-31: 500 mL

## 2020-12-31 MED ORDER — MIDAZOLAM HCL 5 MG/5ML IJ SOLN
INTRAMUSCULAR | Status: AC
  Filled 2020-12-31: qty 5

## 2020-12-31 MED ORDER — VANCOMYCIN HCL IN DEXTROSE 1-5 GM/200ML-% IV SOLN
1000.0000 mg | INTRAVENOUS | Status: AC
  Administered 2020-12-31: 1000 mg via INTRAVENOUS
  Filled 2020-12-31: qty 200

## 2020-12-31 MED ORDER — KETOROLAC TROMETHAMINE 15 MG/ML IJ SOLN
15.0000 mg | Freq: Once | INTRAMUSCULAR | Status: AC
  Administered 2020-12-31: 15 mg via INTRAVENOUS
  Filled 2020-12-31: qty 1

## 2020-12-31 MED ORDER — IOHEXOL 350 MG/ML SOLN
INTRAVENOUS | Status: DC | PRN
  Administered 2020-12-31: 25 mL

## 2020-12-31 MED ORDER — FENTANYL CITRATE (PF) 100 MCG/2ML IJ SOLN
INTRAMUSCULAR | Status: AC
  Filled 2020-12-31: qty 2

## 2020-12-31 MED ORDER — SODIUM CHLORIDE 0.9% FLUSH
3.0000 mL | Freq: Two times a day (BID) | INTRAVENOUS | Status: DC
  Administered 2020-12-31: 3 mL via INTRAVENOUS

## 2020-12-31 MED ORDER — SODIUM CHLORIDE 0.9 % IV SOLN
80.0000 mg | INTRAVENOUS | Status: AC
  Administered 2020-12-31: 80 mg
  Filled 2020-12-31: qty 2

## 2020-12-31 MED ORDER — SODIUM CHLORIDE 0.9% FLUSH: 3.0000 mL | INTRAVENOUS | Status: DC | PRN

## 2020-12-31 MED ORDER — CHLORHEXIDINE GLUCONATE 4 % EX LIQD
60.0000 mL | Freq: Once | CUTANEOUS | Status: AC
  Administered 2020-12-31: 4 via TOPICAL
  Filled 2020-12-31: qty 60

## 2020-12-31 MED ORDER — VANCOMYCIN HCL IN DEXTROSE 1-5 GM/200ML-% IV SOLN
INTRAVENOUS | Status: AC
  Filled 2020-12-31: qty 200

## 2020-12-31 MED ORDER — SODIUM CHLORIDE 0.9 % IV SOLN
INTRAVENOUS | Status: AC
  Filled 2020-12-31: qty 2

## 2020-12-31 MED ORDER — LIDOCAINE HCL (PF) 1 % IJ SOLN
INTRAMUSCULAR | Status: DC | PRN
  Administered 2020-12-31: 60 mL

## 2020-12-31 MED ORDER — HYDROXYZINE HCL 25 MG PO TABS
25.0000 mg | ORAL_TABLET | Freq: Three times a day (TID) | ORAL | Status: DC | PRN
  Administered 2020-12-31: 25 mg via ORAL
  Filled 2020-12-31: qty 1

## 2020-12-31 SURGICAL SUPPLY — 20 items
CABLE SURGICAL S-101-97-12 (CABLE) ×2 IMPLANT
CATH ACUITYPRO 45CM H 9F (CATHETERS) ×2 IMPLANT
CATH ACUITYPRO IC-130 7F (CATHETERS) ×1
CATH GD CS-IC 130 CVD 69X7FR (CATHETERS) ×1 IMPLANT
CATH JOSEPH QUAD ALLRED 6F REP (CATHETERS) ×2 IMPLANT
CATH RIGHTSITE C315HIS02 (CATHETERS) ×2 IMPLANT
ICD MOMENTUM G125 (ICD Generator) ×2 IMPLANT
KIT ESSENTIALS PG (KITS) ×2 IMPLANT
LEAD INGEVITY 7840 45 (Lead) ×2 IMPLANT
LEAD RELIANCE 0137-59 (Lead) ×2 IMPLANT
LEAD SELECT SECURE 3830 383069 (Lead) ×1 IMPLANT
PAD PRO RADIOLUCENT 2001M-C (PAD) ×2 IMPLANT
SELECT SECURE 3830 383069 (Lead) ×2 IMPLANT
SHEATH 7FR PRELUDE SNAP 13 (SHEATH) ×2 IMPLANT
SHEATH 9.5FR PRELUDE SNAP 13 (SHEATH) ×2 IMPLANT
SHEATH 9FR PRELUDE SNAP 13 (SHEATH) ×2 IMPLANT
SLITTER 6232ADJ (MISCELLANEOUS) ×2 IMPLANT
TRAY PACEMAKER INSERTION (PACKS) ×2 IMPLANT
WIRE ACUITY WHISPER EDS 4648 (WIRE) ×4 IMPLANT
WIRE HI TORQ VERSACORE-J 145CM (WIRE) ×2 IMPLANT

## 2020-12-31 NOTE — Progress Notes (Addendum)
Progress Note  Patient Name: Karen Warner Date of Encounter: 12/31/2020  [facility] Cardiologist: Thomasene Ripple, DO   Subjective   No active complaint of CP, no SOB, no syncope since here  Inpatient Medications    Scheduled Meds: . arformoterol  15 mcg Nebulization BID  . aspirin EC  81 mg Oral Daily  . carvedilol  3.125 mg Oral BID WC  . ferrous sulfate  324 mg Oral Q breakfast  . folic acid  1 mg Oral Daily  . insulin aspart  0-15 Units Subcutaneous TID WC  . insulin glargine  25 Units Subcutaneous BID  . Ipratropium-Albuterol  1 puff Inhalation BID  . magnesium oxide  400 mg Oral Daily  . pantoprazole  40 mg Oral Daily  . pregabalin  50 mg Oral TID  . rosuvastatin  40 mg Oral Daily  . sacubitril-valsartan  1 tablet Oral BID  . sodium chloride flush  3 mL Intravenous Q12H  . umeclidinium bromide  1 puff Inhalation Daily  . vitamin B-12  1,000 mcg Oral Daily  . vortioxetine HBr  5 mg Oral QHS   Continuous Infusions:  PRN Meds: acetaminophen **OR** acetaminophen, albuterol, budesonide, diclofenac Sodium, dicyclomine, hydrOXYzine, nitroGLYCERIN, polyethylene glycol, sodium chloride   Vital Signs    Vitals:   12/30/20 1955 12/30/20 2352 12/31/20 0536 12/31/20 0741  BP:  99/62 118/69   Pulse:  84 91 96  Resp:  16 16 14   Temp:  97.9 F (36.6 C) 98.4 F (36.9 C)   TempSrc:  Oral Oral   SpO2: 99% 99% 100% 100%  Weight:   85.5 kg   Height:       No intake or output data in the 24 hours ending 12/31/20 0744 Last 3 Weights 12/31/2020 12/30/2020 12/29/2020  Weight (lbs) 188 lb 9.6 oz 189 lb 4.8 oz 192 lb 10.9 oz  Weight (kg) 85.548 kg 85.866 kg 87.4 kg      Telemetry    SR 80's - Personally Reviewed  ECG    No new EKGs - Personally Reviewed  Physical Exam   GEN: No acute distress.   Neck: No JVD Cardiac: RRR, no murmurs, rubs, or gallops.  Respiratory: CTA b/l. GI: Soft, nontender, non-distended  MS: No edema; No deformity. Neuro:   Nonfocal  Psych: Normal affect   Labs    High Sensitivity Troponin:   Recent Labs  Lab 12/26/20 2246 12/29/20 1411 12/29/20 1655 12/30/20 0336  TROPONINIHS 14 15 15 17       Chemistry Recent Labs  Lab 12/27/20 0259 12/28/20 0736 12/29/20 0757 12/30/20 0021  NA 140 138 137 137  K 3.7 3.7 3.9 3.8  CL 109 105 105 105  CO2 23 23 24 25   GLUCOSE 204* 105* 198* 252*  BUN 16 12 14 14   CREATININE 1.34* 1.22* 1.22* 1.16*  CALCIUM 8.7* 9.0 9.2 8.7*  PROT 6.4*  --   --   --   ALBUMIN 3.2*  --   --   --   AST 17  --   --   --   ALT 11  --   --   --   ALKPHOS 46  --   --   --   BILITOT 0.5  --   --   --   GFRNONAA 43* 48* 48* 51*  ANIONGAP 8 10 8 7      Hematology Recent Labs  Lab 12/27/20 0259 12/28/20 0736 12/30/20 0021  WBC 8.3 6.9 7.9  RBC 4.03 4.14 4.18  HGB 11.2* 11.1* 11.3*  HCT 36.6 37.2 37.8  MCV 90.8 89.9 90.4  MCH 27.8 26.8 27.0  MCHC 30.6 29.8* 29.9*  RDW 15.8* 15.5 15.4  PLT 308 320 313    BNPNo results for input(s): BNP, PROBNP in the last 168 hours.   DDimer No results for input(s): DDIMER in the last 168 hours.   Radiology    DG Chest 1 View  Result Date: 12/29/2020 CLINICAL DATA:  Dyspnea.  Loss of consciousness. EXAM: CHEST  1 VIEW COMPARISON:  12/26/2020 FINDINGS: Stable enlarged cardiac silhouette and mildly tortuous aorta. Clear lungs with normal vascularity. Mild bilateral shoulder degenerative changes. IMPRESSION: No acute abnormality. Stable cardiomegaly. Electronically Signed   By: Beckie Salts M.D.   On: 12/29/2020 14:19    Cardiac Studies    12/27/2020: TTE IMPRESSIONS  1. Left ventricular ejection fraction, by estimation, is 20 to 25%. The  left ventricle has severely decreased function. The left ventricle  demonstrates global hypokinesis. The left ventricular internal cavity size  was mildly dilated. Left ventricular  diastolic parameters are consistent with Grade I diastolic dysfunction  (impaired relaxation).  2. Right  ventricular systolic function is normal. The right ventricular  size is normal. Tricuspid regurgitation signal is inadequate for assessing  PA pressure.  3. The mitral valve is normal in structure. Trivial mitral valve  regurgitation. No evidence of mitral stenosis.  4. The aortic valve is tricuspid. Aortic valve regurgitation is not  visualized. No aortic stenosis is present.  5. The inferior vena cava is normal in size with greater than 50%  respiratory variability, suggesting right atrial pressure of 3 mmHg.     08/03/2020: LHC/PCI  Non-stenotic Mid LAD lesion.   1.  Severe single-vessel coronary artery disease with hemodynamically significant severe stenosis of the proximal LAD, treated successfully with PCI using overlapping drug-eluting stents guided by pressure wire analysis 2.  Continued patency of the distal stented segment in the LAD, but evidence of a possible chronic dissection at the distal stent edge into a small caliber apical LAD 3.  Nonobstructive left circumflex stenosis (dominant circumflex) 4.  Small, nondominant RCA with no significant stenosis 5.  Normal LVEDP  Continue aspirin and ticagrelor x12 months.  Consider discharge later today if no early complications arise.  Continue medical therapy for ischemic cardiomyopathy and coronary artery disease.    Patient Profile     70 y.o. female w/PMHx of CAD (PCI to LAD Sept 2021), CM (presumed ischemic, though fet out of proportion to CAD), HTN, HLD, COPD, ongoing smoker, depression, glaucoma, DM,    Assessment & Plan    1. Syncope 2. LBBB     No warning and recurrent, concerning of arrhythmic etiology in setting of CM     Seen by EP over the weekend, recommend CRT-D implant  I revisited the rational for ICD implant given CM and concerns of possible cardiac arrhythmia We discussed the implant procedure, potential risks/benifits she remains agreeable to proceed. Dr. Ladona Ridgel will see her later this  morning  Will hold plavix this AM (last coronary stent was 08/03/20) resume post implant pending pocket stability  2. CAD     S/p PCI Sep 2021 (prior stent 2020)     On ASA/brilinta >> plavix, BB, statin, getting nitrate and morphine PRN     Home imdur held on admission     Ongoing c/o CP (sounds somewhat chronic), she states that the stent done a few months ago in  September did not change or help the pattern of her CP, s/l NTG sometimes helps sometimes does not, Imdur at home, she was unaware was a nitrate but stated did not seem to change or help her CP either.      Morphine here did help when she got it (lnce)     HS trop neg     ? Mention associated with anxiety, ? Non-cardiac     Management deferred to primary cardiology team  3. CM     In review of Dr. Mallory Shirk note (dated July 2021, echo in June 2020 LVEF 40-45% >> discusses a hospitalization with subsequent LVEF 25-30% and started on GDMT)     Felt out of proportion to her CAD, ? ICM vs NICM     Not felt volume OL currently      Out pt on BB, Entresto, diuretics, aldactone      For questions or updates, please contact [facility] Please consult [email or web address] for contact info under    Signed, Sheilah Pigeon, PA-C  12/31/2020, 7:44 AM    EP Attending  Patient seen and examined. Agree with above. The patient has syncope, an EF of 25%, LBBB, and CAD. The concern is that she is either having transient CHB or VT. I have discussed the indications/risks/benefits/goals/expectations of Biv ICD insertion with the patient and her daughter and they wish to proceed.  Loman Chroman Taylor,MD

## 2020-12-31 NOTE — Progress Notes (Signed)
Progress Note    Karen Warner  NWG:[id number] DOB: 1951-01-27  DOA: 12/26/2020 PCP: Laurel Dimmer, FNP      Brief Narrative:    Medical records reviewed and are as summarized below:  Karen Warner is a 70 y.o. female with medical history significant ofsince her history CAD, hyperlipidemia, PVD, back pain, fibromyalgia, neuropathy, COPD, obesity, GERD, CHF, depression, diabetes, diverticulosis, glaucoma, hypertension, left bundle branch block, anemia, Luetscher syndrome(mineralocorticoid excess),migraines, orthostatic hypotension, syncope, CKD was sent from her PCP/cardiologist office for recurrent syncope and orthostatic hypotension.      Assessment/Plan:   Principal Problem:   Syncope and collapse Active Problems:   Chronic bilateral low back pain with bilateral sciatica   Chronic obstructive pulmonary disease, unspecified (HCC)   Depression   Diabetic polyneuropathy associated with type 2 diabetes mellitus (HCC)   Fibromyalgia affecting multiple sites   Gastro-esophageal reflux disease without esophagitis   History of anemia   Low hemoglobin   Hypertension   PVD (peripheral vascular disease) (HCC)   Migraine, unspecified, not intractable, without status migrainosus   Mixed hyperlipidemia   Moderate episode of recurrent major depressive disorder (HCC)   Type 2 diabetes mellitus without complications (HCC)   Hypertensive heart disease with heart failure (HCC)   Coronary artery disease   Depressed left ventricular ejection fraction   Luetscher's syndrome   Orthostatic hypotension   Syncope    Body mass index is 31.38 kg/m.  (Obesity)    Recurrent syncope with orthostatic changes, chronic systolic CHF/ischemic cardiomyopathy, CAD, PVD, hypertension -Plan for CRT-D implant today. Brilinta has been switched to Plavix.  Continue aspirin and rosuvastatin. Continue antihypertensives  Chest Pain,  Troponins were  negative. Chest pain may be due to anxiety.  DM2 Continue Lantus and NovoLog  COPD Continue bronchodilators  Depression and anxiety Continue vortioxetine  Other comorbidities include anemia of chronic disease, CKD stage IIIa.   Diet Order            Diet NPO time specified Except for: Sips with Meds  Diet effective now                    Consultants:  Cardiologist  Procedures:  Plan for CRT placement on 12/31/2020    Medications:   . [MAR Hold] arformoterol  15 mcg Nebulization BID  . [MAR Hold] aspirin EC  81 mg Oral Daily  . [MAR Hold] carvedilol  3.125 mg Oral BID WC  . chlorhexidine  60 mL Topical Once  . [MAR Hold] ferrous sulfate  324 mg Oral Q breakfast  . [MAR Hold] folic acid  1 mg Oral Daily  . gentamicin irrigation  80 mg Irrigation To Cath  . [MAR Hold] insulin aspart  0-15 Units Subcutaneous TID WC  . [MAR Hold] insulin glargine  25 Units Subcutaneous BID  . [MAR Hold] Ipratropium-Albuterol  1 puff Inhalation BID  . [MAR Hold] magnesium oxide  400 mg Oral Daily  . [MAR Hold] pantoprazole  40 mg Oral Daily  . [MAR Hold] pregabalin  50 mg Oral TID  . [MAR Hold] rosuvastatin  40 mg Oral Daily  . [MAR Hold] sacubitril-valsartan  1 tablet Oral BID  . [MAR Hold] sodium chloride flush  3 mL Intravenous Q12H  . [MAR Hold] sodium chloride flush  3 mL Intravenous Q12H  . [MAR Hold] umeclidinium bromide  1 puff Inhalation Daily  . [MAR Hold] vitamin B-12  1,000 mcg Oral Daily  . [facility]  Hold] vortioxetine HBr  5 mg Oral QHS   Continuous Infusions: . sodium chloride    . sodium chloride       Anti-infectives (From admission, onward)   Start     Dose/Rate Route Frequency Ordered Stop   12/31/20 1215  vancomycin (VANCOCIN) IVPB 1000 mg/200 mL premix        1,000 mg 200 mL/hr over 60 Minutes Intravenous On call 12/31/20 1116 12/31/20 1505   12/31/20 1200  gentamicin (GARAMYCIN) 80 mg in sodium chloride 0.9 % 500 mL irrigation        80 mg Irrigation  To Cath Lab 12/31/20 1116 01/01/21 1200             Family Communication/Anticipated D/C date and plan/Code Status   DVT prophylaxis: Place and maintain sequential compression device Start: 12/31/20 0651     Code Status: Full Code  Family Communication: None Disposition Plan: None   Status is: Inpatient  Remains inpatient appropriate because:Inpatient level of care appropriate due to severity of illness   Dispo: The patient is from: Home              Anticipated d/c is to: Home              Anticipated d/c date is: 1 day              Patient currently is not medically stable to d/c.   Difficult to place patient No           Subjective:   Interval events noted.  She said she had a rough night and the chest pain recurred. She admits to having anxiety.  She said her sister, her husband and her close friend all died on the same day in Dec 01, 2020.  She has been grieving and has had mixed emotions.   Objective:    Vitals:   12/31/20 1448 12/31/20 1454 12/31/20 1458 12/31/20 1505  BP:      Pulse:      Resp:      Temp:      TempSrc:      SpO2: 99% 100% 100% 99%  Weight:      Height:       No data found.  No intake or output data in the 24 hours ending 12/31/20 1505 Filed Weights   12/29/20 0500 12/30/20 0532 12/31/20 0536  Weight: 87.4 kg 85.9 kg 85.5 kg    Exam:  GEN: NAD SKIN: No rash EYES: EOMI ENT: MMM CV: RRR PULM: CTA B ABD: soft, obese, NT, +BS CNS: AAO x 3, non focal EXT: No edema or tenderness     Data Reviewed:   I have personally reviewed following labs and imaging studies:  Labs: Labs show the following:   Basic Metabolic Panel: Recent Labs  Lab 12/26/20 1712 12/26/20 2200 12/27/20 0259 12/28/20 0736 12/29/20 0757 12/30/20 0021  NA 141  --  140 138 137 137  K 3.8  --  3.7 3.7 3.9 3.8  CL 109  --  109 105 105 105  CO2 22  --  23 23 24 25   GLUCOSE 127*  --  204* 105* 198* 252*  BUN 18  --  16 12 14 14    CREATININE 1.43*  --  1.34* 1.22* 1.22* 1.16*  CALCIUM 9.1  --  8.7* 9.0 9.2 8.7*  MG  --  2.3  --  2.0  --   --    GFR Estimated Creatinine Clearance: 48.7 mL/min (A) (  by C-G formula based on SCr of 1.16 mg/dL (H)). Liver Function Tests: Recent Labs  Lab 12/27/20 0259  AST 17  ALT 11  ALKPHOS 46  BILITOT 0.5  PROT 6.4*  ALBUMIN 3.2*   No results for input(s): LIPASE, AMYLASE in the last 168 hours. No results for input(s): AMMONIA in the last 168 hours. Coagulation profile No results for input(s): INR, PROTIME in the last 168 hours.  CBC: Recent Labs  Lab 12/26/20 1712 12/27/20 0259 12/28/20 0736 12/30/20 0021  WBC 7.2 8.3 6.9 7.9  HGB 11.6* 11.2* 11.1* 11.3*  HCT 38.0 36.6 37.2 37.8  MCV 89.8 90.8 89.9 90.4  PLT 345 308 320 313   Cardiac Enzymes: No results for input(s): CKTOTAL, CKMB, CKMBINDEX, TROPONINI in the last 168 hours. BNP (last 3 results) No results for input(s): PROBNP in the last 8760 hours. CBG: Recent Labs  Lab 12/30/20 1047 12/30/20 1547 12/30/20 2057 12/31/20 0600 12/31/20 1104  GLUCAP 157* 180* 275* 195* 181*   D-Dimer: No results for input(s): DDIMER in the last 72 hours. Hgb A1c: No results for input(s): HGBA1C in the last 72 hours. Lipid Profile: No results for input(s): CHOL, HDL, LDLCALC, TRIG, CHOLHDL, LDLDIRECT in the last 72 hours. Thyroid function studies: No results for input(s): TSH, T4TOTAL, T3FREE, THYROIDAB in the last 72 hours.  Invalid input(s): FREET3 Anemia work up: No results for input(s): VITAMINB12, FOLATE, FERRITIN, TIBC, IRON, RETICCTPCT in the last 72 hours. Sepsis Labs: Recent Labs  Lab 12/26/20 1712 12/27/20 0259 12/28/20 0736 12/30/20 0021  WBC 7.2 8.3 6.9 7.9    Microbiology Recent Results (from the past 240 hour(s))  SARS Coronavirus 2 by RT PCR (hospital order, performed in [facility] hospital lab) Nasopharyngeal Nasopharyngeal Swab     Status: None   Collection Time: 12/26/20  9:40 PM    Specimen: Nasopharyngeal Swab  Result Value Ref Range Status   SARS Coronavirus 2 NEGATIVE NEGATIVE Final    Comment: (NOTE) SARS-CoV-2 target nucleic acids are NOT DETECTED.  The SARS-CoV-2 RNA is generally detectable in upper and lower respiratory specimens during the acute phase of infection. The lowest concentration of SARS-CoV-2 viral copies this assay can detect is 250 copies / mL. A negative result does not preclude SARS-CoV-2 infection and should not be used as the sole basis for treatment or other patient management decisions.  A negative result may occur with improper specimen collection / handling, submission of specimen other than nasopharyngeal swab, presence of viral mutation(s) within the areas targeted by this assay, and inadequate number of viral copies (<250 copies / mL). A negative result must be combined with clinical observations, patient history, and epidemiological information.  Fact Sheet for Patients:   BoilerBrush.com.cy  Fact Sheet for Healthcare Providers: https://pope.com/  This test is not yet approved or  cleared by the [place] FDA and has been authorized for detection and/or diagnosis of SARS-CoV-2 by FDA under an Emergency Use Authorization (EUA).  This EUA will remain in effect (meaning this test can be used) for the duration of the COVID-19 declaration under Section 564(b)(1) of the Act, 21 U.S.C. section 360bbb-3(b)(1), unless the authorization is terminated or revoked sooner.  Performed at [facility], [address], [place], [place] [address]   Surgical PCR screen     Status: None   Collection Time: 12/31/20 11:17 AM   Specimen: Nasal Mucosa; Nasal Swab  Result Value Ref Range Status   MRSA, PCR NEGATIVE NEGATIVE Final   Staphylococcus aureus  NEGATIVE NEGATIVE Final    Comment: (NOTE) The Xpert SA Assay (FDA approved for NASAL specimens in patients 71 years of age and older),  is one component of a comprehensive surveillance program. It is not intended to diagnose infection nor to guide or monitor treatment. Performed at [facility], [address], [place], [place] [address]     Procedures and diagnostic studies:  No results found.             LOS: 4 days   BERNARD AYIKU  [facility]   Pager on [email or web address]. If 7PM-7AM, please contact night-coverage at [email or web address]     12/31/2020, 3:05 PM

## 2020-12-31 NOTE — Progress Notes (Signed)
Patient had another episode of chest pain 10/10. Relieved by nitro SL. On call MD notified. See epic for orders. Will cont. to monitor.

## 2020-12-31 NOTE — Progress Notes (Addendum)
-*/  Progress Note  Patient Name: Karen Warner Date of Encounter: 12/31/2020  [facility] Cardiologist: Thomasene Ripple, DO   Subjective   -Another episode of chest pain last night while laying down which resolved with sublingual nitroglycerin x1.  Had chest pain episode on 1/29 late evening as well which required 2 nitroglycerin.  Both episodes occur while laying down.  Associated with shortness of breath.  Patient does not think this is related to acid reflux. -Patient lost significant other in December. -Currently chest pain and shortness of breath free  Inpatient Medications    Scheduled Meds: . arformoterol  15 mcg Nebulization BID  . aspirin EC  81 mg Oral Daily  . carvedilol  3.125 mg Oral BID WC  . ferrous sulfate  324 mg Oral Q breakfast  . folic acid  1 mg Oral Daily  . insulin aspart  0-15 Units Subcutaneous TID WC  . insulin glargine  25 Units Subcutaneous BID  . Ipratropium-Albuterol  1 puff Inhalation BID  . magnesium oxide  400 mg Oral Daily  . pantoprazole  40 mg Oral Daily  . pregabalin  50 mg Oral TID  . rosuvastatin  40 mg Oral Daily  . sacubitril-valsartan  1 tablet Oral BID  . sodium chloride flush  3 mL Intravenous Q12H  . sodium chloride flush  3 mL Intravenous Q12H  . umeclidinium bromide  1 puff Inhalation Daily  . vitamin B-12  1,000 mcg Oral Daily  . vortioxetine HBr  5 mg Oral QHS   Continuous Infusions:  PRN Meds: acetaminophen **OR** acetaminophen, albuterol, budesonide, diclofenac Sodium, dicyclomine, hydrOXYzine, nitroGLYCERIN, polyethylene glycol, sodium chloride   Vital Signs    Vitals:   12/30/20 1955 12/30/20 2352 12/31/20 0536 12/31/20 0741  BP:  99/62 118/69   Pulse:  84 91 96  Resp:  16 16 14   Temp:  97.9 F (36.6 C) 98.4 F (36.9 C)   TempSrc:  Oral Oral   SpO2: 99% 99% 100% 100%  Weight:   85.5 kg   Height:       No intake or output data in the 24 hours ending 12/31/20 0906 Last 3 Weights 12/31/2020 12/30/2020  12/29/2020  Weight (lbs) 188 lb 9.6 oz 189 lb 4.8 oz 192 lb 10.9 oz  Weight (kg) 85.548 kg 85.866 kg 87.4 kg      Telemetry    SR - Personally Reviewed  ECG    No new tracing  Physical Exam   GEN: No acute distress.   Neck: No JVD Cardiac: RRR, no murmurs, rubs, or gallops.  Respiratory: Clear to auscultation bilaterally. GI: Soft, nontender, non-distended  MS: No edema; No deformity. Neuro:  Nonfocal  Psych: Normal affect   Labs    High Sensitivity Troponin:   Recent Labs  Lab 12/26/20 2246 12/29/20 1411 12/29/20 1655 12/30/20 0336  TROPONINIHS 14 15 15 17       Chemistry Recent Labs  Lab 12/27/20 0259 12/28/20 0736 12/29/20 0757 12/30/20 0021  NA 140 138 137 137  K 3.7 3.7 3.9 3.8  CL 109 105 105 105  CO2 23 23 24 25   GLUCOSE 204* 105* 198* 252*  BUN 16 12 14 14   CREATININE 1.34* 1.22* 1.22* 1.16*  CALCIUM 8.7* 9.0 9.2 8.7*  PROT 6.4*  --   --   --   ALBUMIN 3.2*  --   --   --   AST 17  --   --   --   ALT  11  --   --   --   ALKPHOS 46  --   --   --   BILITOT 0.5  --   --   --   GFRNONAA 43* 48* 48* 51*  ANIONGAP 8 10 8 7      Hematology Recent Labs  Lab 12/27/20 0259 12/28/20 0736 12/30/20 0021  WBC 8.3 6.9 7.9  RBC 4.03 4.14 4.18  HGB 11.2* 11.1* 11.3*  HCT 36.6 37.2 37.8  MCV 90.8 89.9 90.4  MCH 27.8 26.8 27.0  MCHC 30.6 29.8* 29.9*  RDW 15.8* 15.5 15.4  PLT 308 320 313     Radiology    DG Chest 1 View  Result Date: 12/29/2020 CLINICAL DATA:  Dyspnea.  Loss of consciousness. EXAM: CHEST  1 VIEW COMPARISON:  12/26/2020 FINDINGS: Stable enlarged cardiac silhouette and mildly tortuous aorta. Clear lungs with normal vascularity. Mild bilateral shoulder degenerative changes. IMPRESSION: No acute abnormality. Stable cardiomegaly. Electronically Signed   By: 12/28/2020 M.D.   On: 12/29/2020 14:19    Cardiac Studies  Echo 12/27/20 1. Left ventricular ejection fraction, by estimation, is 20 to 25%. The  left ventricle has severely  decreased function. The left ventricle  demonstrates global hypokinesis. The left ventricular internal cavity size  was mildly dilated. Left ventricular  diastolic parameters are consistent with Grade I diastolic dysfunction  (impaired relaxation).  2. Right ventricular systolic function is normal. The right ventricular  size is normal. Tricuspid regurgitation signal is inadequate for assessing  PA pressure.  3. The mitral valve is normal in structure. Trivial mitral valve  regurgitation. No evidence of mitral stenosis.  4. The aortic valve is tricuspid. Aortic valve regurgitation is not  visualized. No aortic stenosis is present.  5. The inferior vena cava is normal in size with greater than 50%  respiratory variability, suggesting right atrial pressure of 3 mmHg.   Cath 08/03/2020 CORONARY STENT INTERVENTION  INTRAVASCULAR PRESSURE WIRE/FFR STUDY  LEFT HEART CATH AND CORONARY ANGIOGRAPHY    Conclusion    Non-stenotic Mid LAD lesion.   1.  Severe single-vessel coronary artery disease with hemodynamically significant severe stenosis of the proximal LAD, treated successfully with PCI using overlapping drug-eluting stents guided by pressure wire analysis 2.  Continued patency of the distal stented segment in the LAD, but evidence of a possible chronic dissection at the distal stent edge into a small caliber apical LAD 3.  Nonobstructive left circumflex stenosis (dominant circumflex) 4.  Small, nondominant RCA with no significant stenosis 5.  Normal LVEDP  Continue aspirin and ticagrelor x12 months.  Consider discharge later today if no early complications arise.  Continue medical therapy for ischemic cardiomyopathy and coronary artery disease.    Patient Profile     70 y.o. female with a hx of CAD s/p stenting to LAD, chronic systolic heart failure secondary to ischemic cardiomyopathy, hypertension, hyperlipidemia, recurrent syncope and morbid obesity seen for syncope.    Assessment & Plan    1. Syncope/LBBB -No prodromal symptoms. -Seen by EP and plan for CRT-D implantation today  2.  Chronic systolic heart failure - Echo this admission showed stable LVEF at 20-25% and grade 1DD -Presume ischemic cardiomyopathy however felt out of proportion to her CAD -Her home heart failure medical regiment (carvedilol, dapagliflozin, entresto, imdur, spironolactone, and torsemide  ) held on 1/28 give chronic low blood pressure and fatigue with plan to add gradually.  - Currently on Coreg 3.125mg  BID and Entresto 24-26mg  BID. - Appears euvolemic -  Consider resuming Dapagliflozin at discharge as would not have effect on BP - Will review diuretics with MD  3. Ongoing chest pan with hx of CAD S/p overlapping DES to pLAD 08/2020.  -Home Imdur held due to hypotension and to see response on 1/28. However with ongoing chest pain, especially at night.  Resolved with sublingual nitroglycerin echo time. 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 Likely add Imdur. ? Repeat study.  - Continue ASA and Plavix (switched from Brillinta due to ICD need).   4. HTN - BP improving with above measures  5. DM - Per primary tea m   For questions or updates, please contact [facility] Please consult [email or web address] for contact info under     SignedManson Passey, PA  12/31/2020, 9:06 AM     The patient was seen, examined and discussed with Bhagat,Bhavinkumar PA-C and I agree with the above.   The patient is scheduled to undergo CRT-D implantation today. He is on GDMT at home (carvedilol, dapagliflozin, entresto, imdur, spironolactone, and torsemide), BP soft, diuretics and Dapagliflozin are being held. He has been having on and off chest pain,that she describes as pressure and squeezing, mostly at night when she  is not in upright position, associate with SOB, this started in July. ECG shows LBBB, his last cath showed evidence of a possible chronic dissection at the distal stent edge into a small caliber apical LAD, we will hydrate and add Imdur post procedure today, also reevaluate his chest pain. He appears euvolemic.  Tobias Alexander, MD 12/31/2020

## 2021-01-01 ENCOUNTER — Inpatient Hospital Stay (HOSPITAL_COMMUNITY): Payer: Medicare Other

## 2021-01-01 ENCOUNTER — Encounter (HOSPITAL_COMMUNITY): Payer: Self-pay | Admitting: Internal Medicine

## 2021-01-01 DIAGNOSIS — I2510 Atherosclerotic heart disease of native coronary artery without angina pectoris: Secondary | ICD-10-CM | POA: Diagnosis not present

## 2021-01-01 DIAGNOSIS — I10 Essential (primary) hypertension: Secondary | ICD-10-CM | POA: Diagnosis not present

## 2021-01-01 DIAGNOSIS — I255 Ischemic cardiomyopathy: Secondary | ICD-10-CM | POA: Diagnosis not present

## 2021-01-01 DIAGNOSIS — R55 Syncope and collapse: Secondary | ICD-10-CM | POA: Diagnosis not present

## 2021-01-01 DIAGNOSIS — R0989 Other specified symptoms and signs involving the circulatory and respiratory systems: Secondary | ICD-10-CM | POA: Diagnosis not present

## 2021-01-01 LAB — GLUCOSE, CAPILLARY
Glucose-Capillary: 225 mg/dL — ABNORMAL HIGH (ref 70–99)
Glucose-Capillary: 260 mg/dL — ABNORMAL HIGH (ref 70–99)

## 2021-01-01 MED ORDER — ONDANSETRON HCL 4 MG/2ML IJ SOLN
4.0000 mg | Freq: Four times a day (QID) | INTRAMUSCULAR | Status: DC | PRN
  Administered 2021-01-01: 4 mg via INTRAVENOUS
  Filled 2021-01-01: qty 2

## 2021-01-01 MED ORDER — DAPAGLIFLOZIN PROPANEDIOL 5 MG PO TABS: 5.0000 mg | ORAL_TABLET | Freq: Every day | ORAL | Status: DC

## 2021-01-01 MED ORDER — ACETAMINOPHEN 325 MG PO TABS: 325.0000 mg | ORAL_TABLET | ORAL | Status: DC | PRN

## 2021-01-01 MED ORDER — DICYCLOMINE HCL 20 MG PO TABS
20.0000 mg | ORAL_TABLET | Freq: Three times a day (TID) | ORAL | Status: DC
  Filled 2021-01-01: qty 1

## 2021-01-01 MED ORDER — KETOROLAC TROMETHAMINE 10 MG PO TABS
10.0000 mg | ORAL_TABLET | Freq: Four times a day (QID) | ORAL | Status: DC | PRN
  Administered 2021-01-01: 10 mg via ORAL
  Filled 2021-01-01 (×3): qty 1

## 2021-01-01 MED ORDER — KETOROLAC TROMETHAMINE 10 MG PO TABS: 10.0000 mg | ORAL_TABLET | Freq: Four times a day (QID) | ORAL | 0 refills | Status: DC | PRN

## 2021-01-01 MED ORDER — VANCOMYCIN HCL IN DEXTROSE 1-5 GM/200ML-% IV SOLN
1000.0000 mg | Freq: Two times a day (BID) | INTRAVENOUS | Status: AC
  Administered 2021-01-01: 1000 mg via INTRAVENOUS
  Filled 2021-01-01: qty 200

## 2021-01-01 MED ORDER — FERROUS SULFATE 325 (65 FE) MG PO TABS
325.0000 mg | ORAL_TABLET | Freq: Every day | ORAL | Status: DC
  Administered 2021-01-01: 325 mg via ORAL

## 2021-01-01 MED ORDER — CARVEDILOL 3.125 MG PO TABS: 3.1250 mg | ORAL_TABLET | Freq: Two times a day (BID) | ORAL | 1 refills | Status: DC

## 2021-01-01 MED ORDER — VANCOMYCIN HCL IN DEXTROSE 1-5 GM/200ML-% IV SOLN: 1000.0000 mg | INTRAVENOUS | Status: DC

## 2021-01-01 MED ORDER — CLOPIDOGREL BISULFATE 75 MG PO TABS: 75.0000 mg | ORAL_TABLET | Freq: Every day | ORAL | 1 refills | Status: DC

## 2021-01-01 MED FILL — Lidocaine HCl Local Inj 1%: INTRAMUSCULAR | Qty: 60 | Status: AC

## 2021-01-01 NOTE — Care Management Important Message (Signed)
Important Message  Patient Details  Name: Karen Warner MRN: [id number] Date of Birth: [birth year]   Medicare Important Message Given:  Yes - Important Message mailed due to current National Emergency  Verbal consent obtained due to current National Emergency  Relationship to patient: Self Contact Name: Veroncia Jezek Call Date: 01/01/21  Time: 1510 Phone: [phone] Outcome: No Answer/Busy Important Message mailed to: Patient address on file    Orson Aloe 01/01/2021, 3:10 PM

## 2021-01-01 NOTE — Progress Notes (Addendum)
Progress Note  Patient Name: Karen Warner Date of Encounter: 01/01/2021  [facility] Cardiologist: Thomasene Ripple, DO   Subjective   No active complaint of CP, no SOB, no syncope since here, mild aching at device site  Inpatient Medications    Scheduled Meds: . arformoterol  15 mcg Nebulization BID  . aspirin EC  81 mg Oral Daily  . carvedilol  3.125 mg Oral BID WC  . ferrous sulfate  324 mg Oral Q breakfast  . folic acid  1 mg Oral Daily  . insulin aspart  0-15 Units Subcutaneous TID WC  . insulin glargine  25 Units Subcutaneous BID  . Ipratropium-Albuterol  1 puff Inhalation BID  . magnesium oxide  400 mg Oral Daily  . pantoprazole  40 mg Oral Daily  . pregabalin  50 mg Oral TID  . rosuvastatin  40 mg Oral Daily  . sacubitril-valsartan  1 tablet Oral BID  . sodium chloride flush  3 mL Intravenous Q12H  . sodium chloride flush  3 mL Intravenous Q12H  . umeclidinium bromide  1 puff Inhalation Daily  . vitamin B-12  1,000 mcg Oral Daily  . vortioxetine HBr  5 mg Oral QHS   Continuous Infusions: . vancomycin     PRN Meds: acetaminophen **OR** acetaminophen, acetaminophen, albuterol, budesonide, butalbital-acetaminophen-caffeine, diclofenac Sodium, dicyclomine, hydrOXYzine, nitroGLYCERIN, ondansetron (ZOFRAN) IV, polyethylene glycol, sodium chloride   Vital Signs    Vitals:   12/31/20 2033 12/31/20 2309 01/01/21 0612 01/01/21 0728  BP:  104/61 106/62   Pulse:  91 84   Resp:  14 16   Temp:  98.5 F (36.9 C) 97.8 F (36.6 C)   TempSrc:  Oral Oral   SpO2: 98% 100% 100% 96%  Weight:   85.8 kg   Height:        Intake/Output Summary (Last 24 hours) at 01/01/2021 0750 Last data filed at 12/31/2020 2314 Gross per 24 hour  Intake 693.33 ml  Output -  Net 693.33 ml   Last 3 Weights 01/01/2021 12/31/2020 12/30/2020  Weight (lbs) 189 lb 3.2 oz 188 lb 9.6 oz 189 lb 4.8 oz  Weight (kg) 85.821 kg 85.548 kg 85.866 kg      Telemetry    SR/VP 80's - Personally  Reviewed  ECG    No new EKGs - Personally Reviewed  Physical Exam   GEN: No acute distress.   Neck: No JVD Cardiac: RRR, no murmurs, rubs, or gallops.  Respiratory: CTA b/l. GI: Soft, nontender, non-distended  MS: No edema; No deformity. Neuro:  Nonfocal  Psych: Normal affect   ICD site: stable, no bleeding, no hematoma  Labs    High Sensitivity Troponin:   Recent Labs  Lab 12/26/20 2246 12/29/20 1411 12/29/20 1655 12/30/20 0336  TROPONINIHS 14 15 15 17       Chemistry Recent Labs  Lab 12/27/20 0259 12/28/20 0736 12/29/20 0757 12/30/20 0021  NA 140 138 137 137  K 3.7 3.7 3.9 3.8  CL 109 105 105 105  CO2 23 23 24 25   GLUCOSE 204* 105* 198* 252*  BUN 16 12 14 14   CREATININE 1.34* 1.22* 1.22* 1.16*  CALCIUM 8.7* 9.0 9.2 8.7*  PROT 6.4*  --   --   --   ALBUMIN 3.2*  --   --   --   AST 17  --   --   --   ALT 11  --   --   --   ALKPHOS 46  --   --   --  BILITOT 0.5  --   --   --   GFRNONAA 43* 48* 48* 51*  ANIONGAP 8 10 8 7      Hematology Recent Labs  Lab 12/27/20 0259 12/28/20 0736 12/30/20 0021  WBC 8.3 6.9 7.9  RBC 4.03 4.14 4.18  HGB 11.2* 11.1* 11.3*  HCT 36.6 37.2 37.8  MCV 90.8 89.9 90.4  MCH 27.8 26.8 27.0  MCHC 30.6 29.8* 29.9*  RDW 15.8* 15.5 15.4  PLT 308 320 313    BNPNo results for input(s): BNP, PROBNP in the last 168 hours.   DDimer No results for input(s): DDIMER in the last 168 hours.   Radiology    DG Chest 2 View  Result Date: 01/01/2021 CLINICAL DATA:  AICD. EXAM: CHEST - 2 VIEW COMPARISON:  12/29/2020. FINDINGS: AICD in stable position. Heart size stable. Low lung volumes. Very mild bilateral interstitial prominence. Very mild interstitial edema cannot be excluded. No pleural effusion or pneumothorax. IMPRESSION: 1. AICD in stable position. 2. Very mild bilateral interstitial prominence. Very mild interstitial edema cannot be excluded. Electronically Signed   By: 12/31/2020  Register   On: 01/01/2021 07:25   EP PPM/ICD  IMPLANT  Result Date: 12/31/2020 Conclusion: Successful insertion of a [employer] biventricular ICD in a patient with an ischemic cardiomyopathy status post myocardial infarction, chronic systolic heart failure, ejection fraction 25%, left bundle branch block, and syncope. 01/02/2021, MD   Cardiac Studies    12/27/2020: TTE IMPRESSIONS  1. Left ventricular ejection fraction, by estimation, is 20 to 25%. The  left ventricle has severely decreased function. The left ventricle  demonstrates global hypokinesis. The left ventricular internal cavity size  was mildly dilated. Left ventricular  diastolic parameters are consistent with Grade I diastolic dysfunction  (impaired relaxation).  2. Right ventricular systolic function is normal. The right ventricular  size is normal. Tricuspid regurgitation signal is inadequate for assessing  PA pressure.  3. The mitral valve is normal in structure. Trivial mitral valve  regurgitation. No evidence of mitral stenosis.  4. The aortic valve is tricuspid. Aortic valve regurgitation is not  visualized. No aortic stenosis is present.  5. The inferior vena cava is normal in size with greater than 50%  respiratory variability, suggesting right atrial pressure of 3 mmHg.     08/03/2020: LHC/PCI  Non-stenotic Mid LAD lesion.   1.  Severe single-vessel coronary artery disease with hemodynamically significant severe stenosis of the proximal LAD, treated successfully with PCI using overlapping drug-eluting stents guided by pressure wire analysis 2.  Continued patency of the distal stented segment in the LAD, but evidence of a possible chronic dissection at the distal stent edge into a small caliber apical LAD 3.  Nonobstructive left circumflex stenosis (dominant circumflex) 4.  Small, nondominant RCA with no significant stenosis 5.  Normal LVEDP  Continue aspirin and ticagrelor x12 months.  Consider discharge later today if no early complications  arise.  Continue medical therapy for ischemic cardiomyopathy and coronary artery disease.    Patient Profile     70 y.o. female w/PMHx of CAD (PCI to LAD Sept 2021), CM (presumed ischemic, though fet out of proportion to CAD), HTN, HLD, COPD, ongoing smoker, depression, glaucoma, DM,    Assessment & Plan    1. Syncope 2. LBBB     No warning and recurrent, concerning of arrhythmic etiology in setting of CM     Seen by EP over the weekend, recommend CRT-D implant  S/p CRT ICD implant (LBundle position  and RV) Site is stable CXR with stable lead position and no ptx Device check this AM with stable measurements Routine post implant follow up is in place Wound care and activity instructions reviewed with the patient No PLAVIX or BRILINTA until 01/05/21, ASA is OK   2. CAD     S/p PCI Sep 2021 (prior stent 2020)     On ASA/brilinta >> plavix, BB, statin, getting nitrate and morphine PRN     Home imdur held on admission     Ongoing c/o CP (sounds somewhat chronic), she states that the stent done a few months ago in September did not change or help the pattern of her CP, s/l NTG sometimes helps sometimes does not, Imdur at home, she was unaware was a nitrate but stated did not seem to change or help her CP either.      Morphine here did help when she got it (lnce)     HS trop neg     ? Mention associated with anxiety, ? Non-cardiac     Management deferred to primary cardiology team  No PLAVIX or BRILINTA until 01/05/21, ASA is OK  3. CM     In review of Dr. Mallory Shirk note (dated July 2021, echo in June 2020 LVEF 40-45% >> discusses a hospitalization with subsequent LVEF 25-30% and started on GDMT)     Felt out of proportion to her CAD, ? ICM vs NICM     Not felt volume OL currently      Out pt on BB, Entresto, diuretics, aldactone     Continue with primary cardiology team    OK to discharge from EP perspective when medically ready otherwise. We will sign off though remain available,  please recall if needed      For questions or updates, please contact [facility] Please consult [email or web address] for contact info under    Signed, Sheilah Pigeon, PA-C  01/01/2021, 7:50 AM    EP Attending  Patient seen and examined. Agree with above. The patient is doing well after biv ICD insertion. Her device interrogation under my supervision demonstrates normal biv ICD function. Her CXR looks good. Her incision demonstrates no hematoma. Her QRS has been decreased by over 30 ms. She is stable for DC home with usual followup.  Sharlot Gowda Taylor,MD

## 2021-01-01 NOTE — Progress Notes (Signed)
Pt IV removed, catheter intact. Tele removed, ccmd aware. Pt has all belongings and understands d/c instructions. Pt d/c via wheelchair by RN.  

## 2021-01-01 NOTE — Progress Notes (Signed)
Orthopedic Tech Progress Note Patient Details:  Karen Warner 12-04-1950 [id number] RN stated patient has ARM SLING Patient ID: Karen Warner, female   DOB: 1950/12/05, 70 y.o.   MRN: [id number]   Karen Warner 01/01/2021, 8:37 AM

## 2021-01-01 NOTE — Progress Notes (Incomplete)
Pt IV removed, catheter intact. Tele removed, ccmd aware. Pt has all belongings and understands d/c instructions. Pt d/c via wheelchair by

## 2021-01-01 NOTE — Progress Notes (Addendum)
Progress Note  Patient Name: Karen Warner Date of Encounter: 01/01/2021  [facility] Cardiologist: Thomasene Ripple, DO   Subjective   Feeling well. No chest pain, sob or palpitations.   Inpatient Medications    Scheduled Meds: . arformoterol  15 mcg Nebulization BID  . aspirin EC  81 mg Oral Daily  . carvedilol  3.125 mg Oral BID WC  . dicyclomine  20 mg Oral TID AC & HS  . ferrous sulfate  325 mg Oral Q breakfast  . folic acid  1 mg Oral Daily  . insulin aspart  0-15 Units Subcutaneous TID WC  . insulin glargine  25 Units Subcutaneous BID  . Ipratropium-Albuterol  1 puff Inhalation BID  . magnesium oxide  400 mg Oral Daily  . pantoprazole  40 mg Oral Daily  . pregabalin  50 mg Oral TID  . rosuvastatin  40 mg Oral Daily  . sacubitril-valsartan  1 tablet Oral BID  . sodium chloride flush  3 mL Intravenous Q12H  . sodium chloride flush  3 mL Intravenous Q12H  . umeclidinium bromide  1 puff Inhalation Daily  . vitamin B-12  1,000 mcg Oral Daily  . vortioxetine HBr  5 mg Oral QHS   Continuous Infusions:  PRN Meds: acetaminophen **OR** acetaminophen, acetaminophen, albuterol, budesonide, butalbital-acetaminophen-caffeine, diclofenac Sodium, hydrOXYzine, nitroGLYCERIN, ondansetron (ZOFRAN) IV, polyethylene glycol, sodium chloride   Vital Signs    Vitals:   12/31/20 2033 12/31/20 2309 01/01/21 0612 01/01/21 0728  BP:  104/61 106/62   Pulse:  91 84   Resp:  14 16   Temp:  98.5 F (36.9 C) 97.8 F (36.6 C)   TempSrc:  Oral Oral   SpO2: 98% 100% 100% 96%  Weight:   85.8 kg   Height:        Intake/Output Summary (Last 24 hours) at 01/01/2021 1033 Last data filed at 12/31/2020 2314 Gross per 24 hour  Intake 693.33 ml  Output --  Net 693.33 ml   Last 3 Weights 01/01/2021 12/31/2020 12/30/2020  Weight (lbs) 189 lb 3.2 oz 188 lb 9.6 oz 189 lb 4.8 oz  Weight (kg) 85.821 kg 85.548 kg 85.866 kg      Telemetry    Mostly V paced  - Personally Reviewed  ECG     AV paced - Personally Reviewed  Physical Exam   GEN: No acute distress.   Neck: No JVD Cardiac: RRR, no murmurs, rubs, or gallops. No bleeding at ICD site.  Respiratory: Clear to auscultation bilaterally. GI: Soft, nontender, non-distended  MS: No edema; No deformity. Neuro:  Nonfocal  Psych: Normal affect   Labs    High Sensitivity Troponin:   Recent Labs  Lab 12/26/20 2246 12/29/20 1411 12/29/20 1655 12/30/20 0336  TROPONINIHS 14 15 15 17       Chemistry Recent Labs  Lab 12/27/20 0259 12/28/20 0736 12/29/20 0757 12/30/20 0021  NA 140 138 137 137  K 3.7 3.7 3.9 3.8  CL 109 105 105 105  CO2 23 23 24 25   GLUCOSE 204* 105* 198* 252*  BUN 16 12 14 14   CREATININE 1.34* 1.22* 1.22* 1.16*  CALCIUM 8.7* 9.0 9.2 8.7*  PROT 6.4*  --   --   --   ALBUMIN 3.2*  --   --   --   AST 17  --   --   --   ALT 11  --   --   --   ALKPHOS 46  --   --   --  BILITOT 0.5  --   --   --   GFRNONAA 43* 48* 48* 51*  ANIONGAP 8 10 8 7      Hematology Recent Labs  Lab 12/27/20 0259 12/28/20 0736 12/30/20 0021  WBC 8.3 6.9 7.9  RBC 4.03 4.14 4.18  HGB 11.2* 11.1* 11.3*  HCT 36.6 37.2 37.8  MCV 90.8 89.9 90.4  MCH 27.8 26.8 27.0  MCHC 30.6 29.8* 29.9*  RDW 15.8* 15.5 15.4  PLT 308 320 313    Radiology    DG Chest 2 View  Result Date: 01/01/2021 CLINICAL DATA:  AICD. EXAM: CHEST - 2 VIEW COMPARISON:  12/29/2020. FINDINGS: AICD in stable position. Heart size stable. Low lung volumes. Very mild bilateral interstitial prominence. Very mild interstitial edema cannot be excluded. No pleural effusion or pneumothorax. IMPRESSION: 1. AICD in stable position. 2. Very mild bilateral interstitial prominence. Very mild interstitial edema cannot be excluded. Electronically Signed   By: 12/31/2020  Register   On: 01/01/2021 07:25   EP PPM/ICD IMPLANT  Result Date: 12/31/2020 Conclusion: Successful insertion of a [employer] biventricular ICD in a patient with an ischemic cardiomyopathy  status post myocardial infarction, chronic systolic heart failure, ejection fraction 25%, left bundle branch block, and syncope. 01/02/2021, MD   Cardiac Studies   BIV ICD INSERTION CRT-D  12/31/20   Conclusion  Conclusion: Successful insertion of a [employer] biventricular ICD in a patient with an ischemic cardiomyopathy status post myocardial infarction, chronic systolic heart failure, ejection fraction 25%, left bundle branch block, and syncope.  Echo 12/27/20 1. Left ventricular ejection fraction, by estimation, is 20 to 25%. The  left ventricle has severely decreased function. The left ventricle  demonstrates global hypokinesis. The left ventricular internal cavity size  was mildly dilated. Left ventricular  diastolic parameters are consistent with Grade I diastolic dysfunction  (impaired relaxation).  2. Right ventricular systolic function is normal. The right ventricular  size is normal. Tricuspid regurgitation signal is inadequate for assessing  PA pressure.  3. The mitral valve is normal in structure. Trivial mitral valve  regurgitation. No evidence of mitral stenosis.  4. The aortic valve is tricuspid. Aortic valve regurgitation is not  visualized. No aortic stenosis is present.  5. The inferior vena cava is normal in size with greater than 50%  respiratory variability, suggesting right atrial pressure of 3 mmHg.    Patient Profile     70 y.o. female with a hx of CAD s/p stenting to LAD, chronic systolic heart failure secondary to ischemic cardiomyopathy, hypertension, hyperlipidemia, recurrent syncope and morbid obesityseen for syncope.   Assessment & Plan    1. Syncope/LBBB -No prodromal symptoms. -She has had multiple spells with increasing frequency of losing consciousness. Mostly sitting down then slouched over. Pending outpatient sleep study.  -Seen by EP and underwent CRT-D implantation yesterday  2.  Chronic systolic heart failure - Echo this  admission showed stable LVEF at 20-25% and grade 1DD -Presume ischemic cardiomyopathy however felt out of proportion to her CAD -Her home heart failure medical regiment (carvedilol, dapagliflozin, entresto, imdur, spironolactone, and torsemide ) held on 1/28 give chronic low blood pressure and fatigue with plan to add gradually.  - Currently on Coreg 3.125mg  BID and Entresto 24-26mg  BID. - Appears euvolemic. Likely sliding scale diuretics going forward - Resume Dapagliflozin at discharge as would not have effect on BP  3. Ongoing chest pan with hx of CAD S/p overlapping DES to pLAD 08/2020.  -Home Imdur held due to  hypotension and to see response on 1/28. However with ongoing chest pain, especially at night.  Resolved with sublingual nitroglycerin echo time. Some pain medication as helps at home.  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 Likely add Imdur. ? Repeat study.  - Continue ASA and Plavix (switched from Brillinta due to ICD need).   4. HTN - BP improving with above measures  5. DM - Per primary team  Likely home later today. Ambulate prior to discharge.  - Continue ASA 81mg  qd, Plavix 75 mg qd (resume on 01/05/21), Crestor 40mg  qd, Coreg 3.125mg  BID and Entresto 24/26mg  BID - Resume Dapagliflozin at discharge - Imdur (likely low dose) and Torsemide (likely PRN) recommendation per Dr. 03/05/21  - Follow up with Dr. 01/16/21  For questions or updates, please contact [facility] Please consult [email or web address] for contact info under     Signed, Servando Salina, PA  01/01/2021, 10:33 AM    The patient was seen, examined and discussed with Bhagat,Bhavinkumar PA-C and I agree with the above.   The patient is post successful CRT-D implantation yesterday, QRS width decreased from 158 to 124 ms. She was on GDMT  at home (carvedilol, dapagliflozin, entresto, imdur, spironolactone, and torsemide), currently on Coreg 3.125mg  BID and Entresto 24-26mg  BID, we will resume Dapagliflozin today, hold diuretics, she appears euvolemic and BP soft. We will arrange for an early follow up and readjust meds as needed at that point. Her chest pain/SOB/Orthopnea was more consistent with CHF rather than CAD type of pain, hopefully she improves now post CRT D implantation. We will reevaluate at the next clinic visit.  She can be discharged today.  Manson Passey, MD 01/01/2021

## 2021-01-01 NOTE — Discharge Instructions (Signed)
After Your ICD (Implantable Cardiac Defibrillator)   . You have a Environmental manager ICD  ACTIVITY . Do not lift your arm above shoulder height for 1 week after your procedure. After 7 days, you may progress as below.  . You should remove your sling 24 hours after your procedure, unless otherwise instructed by your provider.     Monday January 07, 2021  Tuesday January 08, 2021 Wednesday January 09, 2021 Thursday January 10, 2021   . Do not lift, push, pull, or carry anything over 10 pounds with the affected arm until 6 weeks (Monday February 11, 2021 ) after your procedure.   . Do NOT DRIVE until you have been seen for your wound check, or as long as instructed by your healthcare provider.   . Ask your healthcare provider when you can go back to work   INCISION/Dressing If you are on a blood thinner such as Coumadin, Xarelto, Eliquis, Plavix, Brilinta, or Pradaxa  Do NOT resume until  01/05/21  . Monitor your defibrillator site for redness, swelling, and drainage. Call the device clinic at [phone] if you experience these symptoms or fever/chills.  . Your incision is sealed with Steri-strips or staples, you may shower 10 days after your procedure or when told by your provider. Do not remove the steri-strips or let the shower hit directly on your site. You may wash around your site with soap and water.    Marland Kitchen Avoid lotions, ointments, or perfumes over your incision until it is well-healed.  . You may use a hot tub or a pool AFTER your wound check appointment if the incision is completely closed.  . Your ICD is designed to protect you from life threatening heart rhythms. Because of this, you may receive a shock.   o 1 shock with no symptoms:  Call the office during business hours. o 1 shock with symptoms (chest pain, chest pressure, dizziness, lightheadedness, shortness of breath, overall feeling unwell):  Call 911. o If you experience 2 or more shocks in 24 hours:  Call 911. o If  you receive a shock, you should not drive for 6 months per the [place] DMV IF you receive appropriate therapy from your ICD.   . ICD Alerts:  Some alerts are vibratory and others beep. These are NOT emergencies. Please call our office to let us know. If this occurs at night or on weekends, it can wait until the next business day. Send a remote transmission.  . If your device is capable of reading fluid status (for heart failure), you will be offered monthly monitoring to review this with you.   DEVICE MANAGEMENT . Remote monitoring is used to monitor your ICD from home. This monitoring is scheduled every 91 days by our office. It allows [place] to keep an eye on the functioning of your device to ensure it is working properly. You will routinely see your Electrophysiologist annually (more often if necessary).   . You should receive your ID card for your new device in 4-8 weeks. Keep this card with you at all times once received. Consider wearing a medical alert bracelet or necklace.  . Your ICD  may be MRI compatible. This will be discussed at your next office visit/wound check.  You should avoid contact with strong electric or magnetic fields.    Do not use amateur (ham) radio equipment or electric (arc) welding torches. MP3 player headphones with magnets should not be used. Some devices are safe to use if held  at least 12 inches (30 cm) from your defibrillator. These include power tools, lawn mowers, and speakers. If you are unsure if something is safe to use, ask your health care provider.   When using your cell phone, hold it to the ear that is on the opposite side from the defibrillator. Do not leave your cell phone in a pocket over the defibrillator.   You may safely use electric blankets, heating pads, computers, and microwave ovens.  Call the office right away if:  You have chest pain.  You feel more than one shock.  You feel more short of breath than you have felt before.  You feel more  light-headed than you have felt before.  Your incision starts to open up.  This information is not intended to replace advice given to you by your health care provider. Make sure you discuss any questions you have with your health care provider.

## 2021-01-01 NOTE — Discharge Summary (Addendum)
Physician Discharge Summary  Aramis Zobel WUJ:[id number] DOB: 08/15/1951 DOA: 12/26/2020  PCP: Laurel Dimmer, FNP  Admit date: 12/26/2020 Discharge date: 01/01/2021  Admitted From: Home Disposition:  Home  Discharge Condition:Stable CODE STATUS:FULL Diet recommendation: Heart Healthy  Brief/Interim Summary: Karen Warner is a 70 y.o. female with medical history significant ofsince her history CAD, hyperlipidemia, PVD, back pain, fibromyalgia, neuropathy, COPD, obesity, GERD, CHF, depression, diabetes, diverticulosis, glaucoma, hypertension, left bundle branch block, anemia, Luetscher syndrome(mineralocorticoid excess),migraines, orthostatic hypotension, syncope, CKD was sent from her PCP/cardiologist office for recurrent syncope and orthostatic hypotension.  She was found to have left bundle branch block.  Cardiology, EP were following and she underwent CRT-D implantation.  She has been cleared by cardiology for discharge.  She will follow-up with cardiology as an outpatient.  Currently she remains hemodynamically stable.  Following problems were addressed during hospitalization:  Recurrent syncope with orthostatic changes, chronic systolic CHF/ischemic cardiomyopathy, CAD, PVD, hypertension -Underwent  CRT-D implantation. -Brilinta has been switched to Plavix.  Continue aspirin and rosuvastatin. -Diuretics, Imdur on hold on discharge.  She will follow-up with cardiology and her medications will be titrated.  Chest Pain,  Troponins were negative. Resolved  DM2 Continue home regimen  COPD Continue home regimen  Depression and anxiety Continue vortioxetine  Other comorbidities include anemia of chronic disease, CKD stage IIIa.  Stable   Discharge Diagnoses:  Principal Problem:   Syncope and collapse Active Problems:   Chronic bilateral low back pain with bilateral sciatica   Chronic obstructive pulmonary disease, unspecified (HCC)    Depression   Diabetic polyneuropathy associated with type 2 diabetes mellitus (HCC)   Fibromyalgia affecting multiple sites   Gastro-esophageal reflux disease without esophagitis   History of anemia   Low hemoglobin   Hypertension   PVD (peripheral vascular disease) (HCC)   Migraine, unspecified, not intractable, without status migrainosus   Mixed hyperlipidemia   Moderate episode of recurrent major depressive disorder (HCC)   Type 2 diabetes mellitus without complications (HCC)   Hypertensive heart disease with heart failure (HCC)   Coronary artery disease   Depressed left ventricular ejection fraction   Luetscher's syndrome   Orthostatic hypotension   Syncope    Discharge Instructions  Discharge Instructions    Diet - low sodium heart healthy   Complete by: As directed    Discharge instructions   Complete by: As directed    1)Please take your medications as instructed 2)Follow up with your PCP in a week 3)Follow up with cardiology on 01/16/2021 at 11: 20 a.m.  Name and number the provider has been attached   Increase activity slowly   Complete by: As directed      Allergies as of 01/01/2021      Reactions   Bee Venom Anaphylaxis   Sumatriptan Other (See Comments), Shortness Of Breath   Migraine worsened   Tramadol Hives   Amoxicillin-pot Clavulanate Diarrhea, Nausea And Vomiting, Other (See Comments)   Other reaction(s): GI Intolerance   Oxycodone Itching, Hives   Other reaction(s): Other (see comments) "Funny feeling in head"  "off the edge"   Buprenorphine Hcl    Other reaction(s): Itching   Clarithromycin    Abdominal pain and diarrhea Abdominal pain and diarrhea Abdominal pain and diarrhea   Duloxetine Hcl    drowsiness drowsiness drowsiness   Hydrocodone    Other reaction(s): Itching   Lactose    Liraglutide    Abdominal discomfort Abdominal discomfort Abdominal discomfort   Tramadol Hcl  Other reaction(s): Itching      Medication List     STOP taking these medications   furosemide 40 MG tablet Commonly known as: LASIX   isosorbide mononitrate 60 MG 24 hr tablet Commonly known as: IMDUR   predniSONE 5 MG tablet Commonly known as: DELTASONE   spironolactone 25 MG tablet Commonly known as: ALDACTONE   Symbicort 80-4.5 MCG/ACT inhaler Generic drug: budesonide-formoterol   ticagrelor 90 MG Tabs tablet Commonly known as: BRILINTA   torsemide 20 MG tablet Commonly known as: DEMADEX     TAKE these medications   acetaminophen 325 MG tablet Commonly known as: TYLENOL Take 2 tablets by mouth every 6 (six) hours as needed.   ACETAMINOPHEN-BUTALBITAL 50-325 MG Tabs Take 1 tablet by mouth every 4 (four) hours as needed.   albuterol 108 (90 Base) MCG/ACT inhaler Commonly known as: VENTOLIN HFA Inhale 2 puffs into the lungs every 6 (six) hours as needed.   albuterol (2.5 MG/3ML) 0.083% nebulizer solution Commonly known as: PROVENTIL INL CONTENTS OF 1 VIAL VIA NEBULIZER Q 4 H PRF WHEEZING OR COUGH   aspirin 81 MG EC tablet 81 mg daily.   baclofen 10 MG tablet Commonly known as: LIORESAL Take 10 mg by mouth 2 (two) times daily.   budesonide 1 MG/2ML nebulizer solution Commonly known as: PULMICORT Take 1 mg by nebulization 2 (two) times daily as needed (shortness of breath).   carvedilol 3.125 MG tablet Commonly known as: COREG Take 1 tablet (3.125 mg total) by mouth 2 (two) times daily with a meal. What changed:   medication strength  how much to take   clopidogrel 75 MG tablet Commonly known as: Plavix Take 1 tablet (75 mg total) by mouth daily. Start taking on: January 05, 2021   Combivent Respimat 20-100 MCG/ACT Aers respimat Generic drug: Ipratropium-Albuterol Inhale 1 puff into the lungs 2 (two) times daily.   dapagliflozin propanediol 10 MG Tabs tablet Commonly known as: Farxiga Take 1 tablet (10 mg total) by mouth daily before breakfast.   diclofenac Sodium 1 % Gel Commonly known as:  VOLTAREN Apply 2 g topically 2 (two) times daily as needed.   dicyclomine 10 MG capsule Commonly known as: BENTYL Take 10 mg by mouth every 6 (six) hours as needed for spasms.   DULoxetine 30 MG capsule Commonly known as: CYMBALTA Take 30 mg by mouth daily.   Entresto 24-26 MG Generic drug: sacubitril-valsartan TAKE 1 TABLET BY MOUTH TWICE DAILY   esomeprazole 40 MG capsule Commonly known as: NEXIUM Take 40 mg by mouth daily at 12 noon.   famotidine 40 MG tablet Commonly known as: PEPCID Take 40 mg by mouth at bedtime.   ferrous sulfate 324 MG Tbec Take 324 mg by mouth daily with breakfast.   fluticasone 50 MCG/ACT nasal spray Commonly known as: FLONASE Place into both nostrils.   folic acid 1 MG tablet Commonly known as: FOLVITE Take 1 mg by mouth daily.   insulin lispro 100 UNIT/ML KwikPen Commonly known as: HUMALOG Inject 16 Units into the skin 3 (three) times daily. Administer 16 units three times daily before a meal per sliding scale   ketorolac 10 MG tablet Commonly known as: TORADOL Take 1 tablet (10 mg total) by mouth every 6 (six) hours as needed for moderate pain.   latanoprost 0.005 % ophthalmic solution Commonly known as: XALATAN SMARTSIG:In Eye(s)   loratadine 10 MG tablet Commonly known as: CLARITIN Take 10 mg by mouth daily.   magnesium oxide 400 MG  tablet Commonly known as: MAG-OX Take 400 mg by mouth daily.   nitroGLYCERIN 0.4 MG SL tablet Commonly known as: NITROSTAT Place 1 tablet (0.4 mg total) under the tongue every 5 (five) minutes as needed for chest pain.   nystatin 100000 UNIT/ML suspension Commonly known as: MYCOSTATIN Take 5 mLs by mouth 4 (four) times daily.   nystatin cream Commonly known as: MYCOSTATIN Apply topically.   potassium chloride 10 MEQ tablet Commonly known as: KLOR-CON Take 10 mEq by mouth daily.   pregabalin 50 MG capsule Commonly known as: LYRICA Take 50 mg by mouth 3 (three) times daily.    rosuvastatin 40 MG tablet Commonly known as: CRESTOR Take 40 mg by mouth daily.   Serevent Diskus 50 MCG/DOSE diskus inhaler Generic drug: salmeterol Inhale 1 puff into the lungs 2 (two) times daily.   Spiriva Respimat 2.5 MCG/ACT Aers Generic drug: Tiotropium Bromide Monohydrate SMARTSIG:2 Puff(s) Via Inhaler Daily   Tresiba FlexTouch 200 UNIT/ML FlexTouch Pen Generic drug: insulin degludec Inject 40 Units into the skin 2 (two) times daily.   Trintellix 5 MG Tabs tablet Generic drug: vortioxetine HBr Take 5 mg by mouth at bedtime.   valACYclovir 1000 MG tablet Commonly known as: VALTREX Take 1,000 mg by mouth 2 (two) times daily as needed (flares).   vitamin B-12 1000 MCG tablet Commonly known as: CYANOCOBALAMIN Take 1,000 mcg by mouth daily.   Vitamin D (Ergocalciferol) 1.25 MG (50000 UNIT) Caps capsule Commonly known as: DRISDOL Take 50,000 Units by mouth once a week. sundays   Wal-Dryl Allergy 12.5 MG/5ML liquid Generic drug: diphenhydrAMINE Take 12.5 mg by mouth daily as needed for allergies or itching.       Follow-up Information    [facility] Office Follow up.   Specialty: Cardiology Why: 01/15/21 @ 10:00AM, wound check visit Contact information: [address] [place] [address] [phone]       Marinus Maw, MD Follow up.   Specialty: Cardiology Why: 04/03/21 @ 3:45PM Contact information: [address] [place] [address] [phone]        Thomasene Ripple, DO. Go on 01/16/2021.   Specialty: Cardiology Why: @11 :20am for hospital follow up. Call if need to reschedule.  Contact information: [address] [place] [address] [phone]        Laurel Dimmer, FNP. Schedule an appointment as soon as possible for a visit in 1 week(s).   Specialty: General Practice Contact information: [address] [name] [address] [phone]         Thomasene Ripple, DO .   Specialty: Cardiology Contact information: [address] [place] [address] [phone]              Allergies  Allergen Reactions  . Bee Venom Anaphylaxis  . Sumatriptan Other (See Comments) and Shortness Of Breath    Migraine worsened  . Tramadol Hives  . Amoxicillin-Pot Clavulanate Diarrhea, Nausea And Vomiting and Other (See Comments)    Other reaction(s): GI Intolerance  . Oxycodone Itching and Hives    Other reaction(s): Other (see comments) "Funny feeling in head"  "off the edge"   . Buprenorphine Hcl     Other reaction(s): Itching  . Clarithromycin     Abdominal pain and diarrhea Abdominal pain and diarrhea Abdominal pain and diarrhea  . Duloxetine Hcl     drowsiness drowsiness drowsiness  . Hydrocodone     Other  reaction(s): Itching  . Lactose   . Liraglutide     Abdominal discomfort Abdominal discomfort Abdominal discomfort  . Tramadol Hcl     Other reaction(s): Itching    Consultations:  Cardiology   Procedures/Studies: DG Chest 1 View  Result Date: 12/29/2020 CLINICAL DATA:  Dyspnea.  Loss of consciousness. EXAM: CHEST  1 VIEW COMPARISON:  12/26/2020 FINDINGS: Stable enlarged cardiac silhouette and mildly tortuous aorta. Clear lungs with normal vascularity. Mild bilateral shoulder degenerative changes. IMPRESSION: No acute abnormality. Stable cardiomegaly. Electronically Signed   By: Beckie SaltsSteven  Reid M.D.   On: 12/29/2020 14:19   DG Chest 1 View  Result Date: 12/26/2020 CLINICAL DATA:  Chest pain EXAM: CHEST  1 VIEW COMPARISON:  12/23/2020 FINDINGS: Single frontal view of the chest demonstrates stable enlargement of the cardiac silhouette. No airspace disease, effusion, or pneumothorax. No acute bony abnormalities. IMPRESSION: 1. Stable cardiomegaly.  No acute process. Electronically Signed   By: Sharlet SalinaMichael  Brown M.D.   On: 12/26/2020 21:05   DG Chest 2 View  Result Date: 01/01/2021 CLINICAL DATA:  AICD. EXAM: CHEST - 2  VIEW COMPARISON:  12/29/2020. FINDINGS: AICD in stable position. Heart size stable. Low lung volumes. Very mild bilateral interstitial prominence. Very mild interstitial edema cannot be excluded. No pleural effusion or pneumothorax. IMPRESSION: 1. AICD in stable position. 2. Very mild bilateral interstitial prominence. Very mild interstitial edema cannot be excluded. Electronically Signed   By: Maisie Fushomas  Register   On: 01/01/2021 07:25   EP PPM/ICD IMPLANT  Result Date: 12/31/2020 Conclusion: Successful insertion of a [employer] biventricular ICD in a patient with an ischemic cardiomyopathy status post myocardial infarction, chronic systolic heart failure, ejection fraction 25%, left bundle branch block, and syncope. Lewayne BuntingGregg Taylor, MD  ECHOCARDIOGRAM COMPLETE  Result Date: 12/27/2020    ECHOCARDIOGRAM REPORT   Patient Name:   Asyah ANN EVANS WALTON Date of Exam: 12/27/2020 Medical Rec #:  [id number]                 Height:       65.0 in Accession #:    [id number]                Weight:       188.0 lb Date of Birth:  [birth year]                 BSA:          1.927 m Patient Age:    69 years                  BP:           72/61 mmHg Patient Gender: F                         HR:           77 bpm. Exam Location:  Inpatient Procedure: 2D Echo, Cardiac Doppler and Color Doppler Indications:    R55 Syncope  History:        Patient has no prior history of Echocardiogram examinations.                 CHF, CAD and Previous Myocardial Infarction, COPD; Risk                 Factors:Hypertension, Diabetes and Dyslipidemia.  Sonographer:    Elmarie Shileyiffany Dance Referring Phys: [id number] Cecille PoALEXANDER B MELVIN IMPRESSIONS  1. Left ventricular ejection fraction, by estimation, is 20 to 25%.  The left ventricle has severely decreased function. The left ventricle demonstrates global hypokinesis. The left ventricular internal cavity size was mildly dilated. Left ventricular diastolic parameters are consistent with Grade I diastolic  dysfunction (impaired relaxation).  2. Right ventricular systolic function is normal. The right ventricular size is normal. Tricuspid regurgitation signal is inadequate for assessing PA pressure.  3. The mitral valve is normal in structure. Trivial mitral valve regurgitation. No evidence of mitral stenosis.  4. The aortic valve is tricuspid. Aortic valve regurgitation is not visualized. No aortic stenosis is present.  5. The inferior vena cava is normal in size with greater than 50% respiratory variability, suggesting right atrial pressure of 3 mmHg. FINDINGS  Left Ventricle: Left ventricular ejection fraction, by estimation, is 20 to 25%. The left ventricle has severely decreased function. The left ventricle demonstrates global hypokinesis. The left ventricular internal cavity size was mildly dilated. There is no left ventricular hypertrophy. Left ventricular diastolic parameters are consistent with Grade I diastolic dysfunction (impaired relaxation). Right Ventricle: The right ventricular size is normal. No increase in right ventricular wall thickness. Right ventricular systolic function is normal. Tricuspid regurgitation signal is inadequate for assessing PA pressure. Left Atrium: Left atrial size was normal in size. Right Atrium: Right atrial size was normal in size. Pericardium: There is no evidence of pericardial effusion. Mitral Valve: The mitral valve is normal in structure. Trivial mitral valve regurgitation. No evidence of mitral valve stenosis. Tricuspid Valve: The tricuspid valve is normal in structure. Tricuspid valve regurgitation is not demonstrated. Aortic Valve: The aortic valve is tricuspid. Aortic valve regurgitation is not visualized. No aortic stenosis is present. Pulmonic Valve: The pulmonic valve was normal in structure. Pulmonic valve regurgitation is not visualized. Aorta: The aortic root is normal in size and structure. Venous: The inferior vena cava is normal in size with greater than 50%  respiratory variability, suggesting right atrial pressure of 3 mmHg. IAS/Shunts: No atrial level shunt detected by color flow Doppler.  LEFT VENTRICLE PLAX 2D LVOT diam:     1.70 cm  Diastology LV SV:         33       LV e' medial:    4.13 cm/s LV SV Index:   17       LV E/e' medial:  21.6 LVOT Area:     2.27 cm LV e' lateral:   4.03 cm/s                         LV E/e' lateral: 22.1  RIGHT VENTRICLE             IVC RV Basal diam:  2.50 cm     IVC diam: 1.70 cm RV S prime:     12.60 cm/s TAPSE (M-mode): 1.6 cm LEFT ATRIUM             Index       RIGHT ATRIUM           Index LA Vol (A2C):   54.4 ml 28.23 ml/m RA Area:     13.20 cm LA Vol (A4C):   38.0 ml 19.72 ml/m RA Volume:   33.40 ml  17.33 ml/m LA Biplane Vol: 46.7 ml 24.24 ml/m  AORTIC VALVE LVOT Vmax:   80.80 cm/s LVOT Vmean:  58.400 cm/s LVOT VTI:    0.145 m  AORTA Ao Root diam: 2.90 cm Ao Asc diam:  3.10 cm MITRAL VALVE MV Area (PHT): 5.27 cm  SHUNTS MV Decel Time: 144 msec     Systemic VTI:  0.14 m MV E velocity: 89.20 cm/s   Systemic Diam: 1.70 cm MV A velocity: 115.00 cm/s MV E/A ratio:  0.78 Marca Ancona MD Electronically signed by Marca Ancona MD Signature Date/Time: 12/27/2020/6:23:47 PM    Final       Subjective: Patient seen and examined the bedside this morning.  Hemodynamically stable for discharge.  Discharge Exam: Vitals:   01/01/21 0728 01/01/21 1245  BP:  103/67  Pulse:  84  Resp:  16  Temp:  97.8 F (36.6 C)  SpO2: 96% 100%   Vitals:   12/31/20 2309 01/01/21 0612 01/01/21 0728 01/01/21 1245  BP: 104/61 106/62  103/67  Pulse: 91 84  84  Resp: 14 16  16   Temp: 98.5 F (36.9 C) 97.8 F (36.6 C)  97.8 F (36.6 C)  TempSrc: Oral Oral  Oral  SpO2: 100% 100% 96% 100%  Weight:  85.8 kg    Height:        General: Pt is alert, awake, not in acute distress Cardiovascular: RRR, S1/S2 +, no rubs, no gallops, pacemaker Respiratory: CTA bilaterally, no wheezing, no rhonchi Abdominal: Soft, NT, ND, bowel sounds  + Extremities: no edema, no cyanosis    The results of significant diagnostics from this hospitalization (including imaging, microbiology, ancillary and laboratory) are listed below for reference.     Microbiology: Recent Results (from the past 240 hour(s))  SARS Coronavirus 2 by RT PCR (hospital order, performed in [facility] hospital lab) Nasopharyngeal Nasopharyngeal Swab     Status: None   Collection Time: 12/26/20  9:40 PM   Specimen: Nasopharyngeal Swab  Result Value Ref Range Status   SARS Coronavirus 2 NEGATIVE NEGATIVE Final    Comment: (NOTE) SARS-CoV-2 target nucleic acids are NOT DETECTED.  The SARS-CoV-2 RNA is generally detectable in upper and lower respiratory specimens during the acute phase of infection. The lowest concentration of SARS-CoV-2 viral copies this assay can detect is 250 copies / mL. A negative result does not preclude SARS-CoV-2 infection and should not be used as the sole basis for treatment or other patient management decisions.  A negative result may occur with improper specimen collection / handling, submission of specimen other than nasopharyngeal swab, presence of viral mutation(s) within the areas targeted by this assay, and inadequate number of viral copies (<250 copies / mL). A negative result must be combined with clinical observations, patient history, and epidemiological information.  Fact Sheet for Patients:   BoilerBrush.com.cy  Fact Sheet for Healthcare Providers: https://pope.com/  This test is not yet approved or  cleared by the [place] FDA and has been authorized for detection and/or diagnosis of SARS-CoV-2 by FDA under an Emergency Use Authorization (EUA).  This EUA will remain in effect (meaning this test can be used) for the duration of the COVID-19 declaration under Section 564(b)(1) of the Act, 21 U.S.C. section 360bbb-3(b)(1), unless the authorization is terminated  or revoked sooner.  Performed at [facility], [address], [place], [place] [address]   Surgical PCR screen     Status: None   Collection Time: 12/31/20 11:17 AM   Specimen: Nasal Mucosa; Nasal Swab  Result Value Ref Range Status   MRSA, PCR NEGATIVE NEGATIVE Final   Staphylococcus aureus NEGATIVE NEGATIVE Final    Comment: (NOTE) The Xpert SA Assay (FDA approved for NASAL specimens in patients 15 years of age and older), is one component of  a comprehensive surveillance program. It is not intended to diagnose infection nor to guide or monitor treatment. Performed at [facility], [address] [place], [place] [address]      Labs: BNP (last 3 results) Recent Labs    07/13/20 1600  BNP 209.8*   Basic Metabolic Panel: Recent Labs  Lab 12/26/20 1712 12/26/20 2200 12/27/20 0259 12/28/20 0736 12/29/20 0757 12/30/20 0021  NA 141  --  140 138 137 137  K 3.8  --  3.7 3.7 3.9 3.8  CL 109  --  109 105 105 105  CO2 22  --  23 23 24 25   GLUCOSE 127*  --  204* 105* 198* 252*  BUN 18  --  16 12 14 14   CREATININE 1.43*  --  1.34* 1.22* 1.22* 1.16*  CALCIUM 9.1  --  8.7* 9.0 9.2 8.7*  MG  --  2.3  --  2.0  --   --    Liver Function Tests: Recent Labs  Lab 12/27/20 0259  AST 17  ALT 11  ALKPHOS 46  BILITOT 0.5  PROT 6.4*  ALBUMIN 3.2*   No results for input(s): LIPASE, AMYLASE in the last 168 hours. No results for input(s): AMMONIA in the last 168 hours. CBC: Recent Labs  Lab 12/26/20 1712 12/27/20 0259 12/28/20 0736 12/30/20 0021  WBC 7.2 8.3 6.9 7.9  HGB 11.6* 11.2* 11.1* 11.3*  HCT 38.0 36.6 37.2 37.8  MCV 89.8 90.8 89.9 90.4  PLT 345 308 320 313   Cardiac Enzymes: No results for input(s): CKTOTAL, CKMB, CKMBINDEX, TROPONINI in the last 168 hours. BNP: Invalid input(s): POCBNP CBG: Recent Labs  Lab 12/31/20 1104 12/31/20 1614 12/31/20 2101 01/01/21 0614 01/01/21 1116  GLUCAP 181* 124* 261* 225* 260*   D-Dimer No results for  input(s): DDIMER in the last 72 hours. Hgb A1c No results for input(s): HGBA1C in the last 72 hours. Lipid Profile No results for input(s): CHOL, HDL, LDLCALC, TRIG, CHOLHDL, LDLDIRECT in the last 72 hours. Thyroid function studies No results for input(s): TSH, T4TOTAL, T3FREE, THYROIDAB in the last 72 hours.  Invalid input(s): FREET3 Anemia work up No results for input(s): VITAMINB12, FOLATE, FERRITIN, TIBC, IRON, RETICCTPCT in the last 72 hours. Urinalysis    Component Value Date/Time   COLORURINE YELLOW 12/26/2020 1655   APPEARANCEUR HAZY (A) 12/26/2020 1655   LABSPEC 1.013 12/26/2020 1655   PHURINE 5.0 12/26/2020 1655   GLUCOSEU >=500 (A) 12/26/2020 1655   HGBUR MODERATE (A) 12/26/2020 1655   BILIRUBINUR NEGATIVE 12/26/2020 1655   KETONESUR NEGATIVE 12/26/2020 1655   PROTEINUR NEGATIVE 12/26/2020 1655   NITRITE NEGATIVE 12/26/2020 1655   LEUKOCYTESUR NEGATIVE 12/26/2020 1655   Sepsis Labs Invalid input(s): PROCALCITONIN,  WBC,  LACTICIDVEN Microbiology Recent Results (from the past 240 hour(s))  SARS Coronavirus 2 by RT PCR (hospital order, performed in [facility] hospital lab) Nasopharyngeal Nasopharyngeal Swab     Status: None   Collection Time: 12/26/20  9:40 PM   Specimen: Nasopharyngeal Swab  Result Value Ref Range Status   SARS Coronavirus 2 NEGATIVE NEGATIVE Final    Comment: (NOTE) SARS-CoV-2 target nucleic acids are NOT DETECTED.  The SARS-CoV-2 RNA is generally detectable in upper and lower respiratory specimens during the acute phase of infection. The lowest concentration of SARS-CoV-2 viral copies this assay can detect is 250 copies / mL. A negative result does not preclude SARS-CoV-2 infection and should not be used as the sole basis for treatment or other  patient management decisions.  A negative result may occur with improper specimen collection / handling, submission of specimen other than nasopharyngeal swab, presence of viral mutation(s) within  the areas targeted by this assay, and inadequate number of viral copies (<250 copies / mL). A negative result must be combined with clinical observations, patient history, and epidemiological information.  Fact Sheet for Patients:   BoilerBrush.com.cy  Fact Sheet for Healthcare Providers: https://pope.com/  This test is not yet approved or  cleared by the [place] FDA and has been authorized for detection and/or diagnosis of SARS-CoV-2 by FDA under an Emergency Use Authorization (EUA).  This EUA will remain in effect (meaning this test can be used) for the duration of the COVID-19 declaration under Section 564(b)(1) of the Act, 21 U.S.C. section 360bbb-3(b)(1), unless the authorization is terminated or revoked sooner.  Performed at [facility], [address] [place], [place] [address]   Surgical PCR screen     Status: None   Collection Time: 12/31/20 11:17 AM   Specimen: Nasal Mucosa; Nasal Swab  Result Value Ref Range Status   MRSA, PCR NEGATIVE NEGATIVE Final   Staphylococcus aureus NEGATIVE NEGATIVE Final    Comment: (NOTE) The Xpert SA Assay (FDA approved for NASAL specimens in patients 39 years of age and older), is one component of a comprehensive surveillance program. It is not intended to diagnose infection nor to guide or monitor treatment. Performed at [facility], [address], [place], [place] [address]     Please note: You were cared for by a hospitalist during your hospital stay. Once you are discharged, your primary care physician will handle any further medical issues. Please note that NO REFILLS for any discharge medications will be authorized once you are discharged, as it is imperative that you return to your primary care physician (or establish a relationship with a primary care physician if you do not have one) for your post hospital discharge needs so that they can reassess your need for  medications and monitor your lab values.    Time coordinating discharge: 40 minutes  SIGNED:   Burnadette Pop, MD  [facility] 01/01/2021, 2:50 PM Pager [phone]  If 7PM-7AM, please contact night-coverage [email or web address] Password [email or web address]

## 2021-01-02 ENCOUNTER — Telehealth: Payer: Self-pay | Admitting: Cardiology

## 2021-01-02 NOTE — Telephone Encounter (Signed)
No PA Required for sleep study, pt notified.

## 2021-01-07 ENCOUNTER — Telehealth: Payer: Self-pay | Admitting: Cardiology

## 2021-01-07 MED ORDER — PANTOPRAZOLE SODIUM 40 MG PO TBEC: 40.0000 mg | DELAYED_RELEASE_TABLET | Freq: Every day | ORAL | 2 refills | Status: DC

## 2021-01-07 NOTE — Telephone Encounter (Signed)
Patient said she has some swelling at the insertion site of her pacemaker. She wanted to know if that was normal or not. Please advise

## 2021-01-07 NOTE — Telephone Encounter (Signed)
Called patient informed her to stay on aspirin and plavix however we will have her stop nexium and try pantoprazole 40 mg daily instead. She verbally understood. Due to her 5lb weight gain she wants to know if Dr. Servando Salina thinks she needs to go back on torsemide. When she was in hospital they took her off. No shortness of breath or swelling right now but she has had the weight gain. Will consult with Dr. Servando Salina.

## 2021-01-07 NOTE — Telephone Encounter (Signed)
Please let her know she should stay on the aspirin and the Plavix and I would like her to take pantoprazole instead of the Nexium.  We can call that in for her.

## 2021-01-07 NOTE — Telephone Encounter (Signed)
Pt c/o medication issue:  1. Name of Medication: clopidogrel (PLAVIX) 75 MG tablet  2. How are you currently taking this medication (dosage and times per day)? Patient has not started medication yet   3. Are you having a reaction (difficulty breathing--STAT)? no  4. What is your medication issue? Patient is taking Nexium and Baby Aspirin. She was reading the medication information for the Plavix and there may be interactions with both Nexium and Baby Aspirin. She wanted to know if she should still start taking this medication or not. She does not have an appointment to see Dr. Servando Salina until next week   Pt c/o swelling: STAT is pt has developed SOB within 24 hours  1) How much weight have you gained and in what time span? 5 pounds overnight   2) If swelling, where is the swelling located? Feet and hands  3) Are you currently taking a fluid pill? No. Patient said it was d/c when she was sent home from the hospital   4) Are you currently SOB? no  5) Do you have a log of your daily weights (if so, list)?   01/04/21: 193.4  01/05/21: 195.3  01/06/21: 190  01/07/21: 195 6) Have you gained 3 pounds in a day or 5 pounds in a week? 5 pounds overnight   7) Have you traveled recently? no

## 2021-01-07 NOTE — Telephone Encounter (Signed)
Pantoprazole 40 mg daily

## 2021-01-07 NOTE — Telephone Encounter (Signed)
Returning patients phone call. Patient reports a concern for swelling at her ICD site. States her significant other states it has gone down in size but she states she is concerned it has grown in size with swelling. Denies any fever or chills, redness or warmth. Requested patient send picture through email and we can evaluate to see if she needs to come into device clinic for in person assessment. Work Agricultural engineer provided to patient. Advised patient we will call after picture is received.

## 2021-01-07 NOTE — Addendum Note (Signed)
Addended by: Hazle Quant on: 01/07/2021 04:47 PM   Modules accepted: Orders

## 2021-01-07 NOTE — Telephone Encounter (Signed)
Patient gave permission to attach to medical record. Advised patient her site looks very well with minimal swelling from what the picture shows. Advised patient to continue to monitor for increased swelling, redness, warmth, fever or chills. Made aware of device clinic follow up 01/15/21 @ 10:00.  Verbalized understanding.

## 2021-01-08 MED ORDER — TORSEMIDE 10 MG PO TABS: ORAL_TABLET | ORAL | 1 refills | Status: DC

## 2021-01-08 NOTE — Telephone Encounter (Signed)
Please have her start back on the torsemide every other day.

## 2021-01-08 NOTE — Addendum Note (Signed)
Addended by: Hazle Quant on: 01/08/2021 08:08 AM   Modules accepted: Orders

## 2021-01-08 NOTE — Telephone Encounter (Signed)
Called patient informed her to start torsemide 10 mg back but every other day per Dr. Servando Salina. Patient verbally understood. She will let us know if this doesn't help.

## 2021-01-11 ENCOUNTER — Telehealth: Payer: Self-pay | Admitting: Cardiology

## 2021-01-11 NOTE — Telephone Encounter (Signed)
I would prefer that that patient use other agents other than NSAIDs. I recommend the use of tylenol given her heart failure with reduced ejection fraction.

## 2021-01-11 NOTE — Telephone Encounter (Signed)
Called patient informed her Dr. Servando Salina prefers she use tylenol instead of anymore NSAIDS. Patient verbally understood.

## 2021-01-11 NOTE — Telephone Encounter (Signed)
New Message:     Pt had  Pacemaker put in . She says she is still in a lot of pain. She would like for Dr Servando Salina to call her in some more pain medicine until she see her on 01-16-21 please.    *STAT* If patient is at the pharmacy, call can be transferred to refill team.   1. Which medications need to be refilled? (please list name of each medication and dose if known) Ketorolac 2. Which pharmacy/location (including street and city if local pharmacy) is medication to be sent to? [facility] [place], ,[place]  3. Do they need a 30 day or 90 day supply? Enough until her appt on Wednesday(01-16-21)

## 2021-01-11 NOTE — Telephone Encounter (Signed)
Patient is asking for more ketorolac to be sent in for her. The hospitalist at the time Dr. Renford Dills is the one who orignally called in. Will check with Dr. Servando Salina.

## 2021-01-14 ENCOUNTER — Other Ambulatory Visit: Payer: Self-pay

## 2021-01-14 ENCOUNTER — Ambulatory Visit (INDEPENDENT_AMBULATORY_CARE_PROVIDER_SITE_OTHER): Payer: Medicare Other | Admitting: Gastroenterology

## 2021-01-14 ENCOUNTER — Encounter: Payer: Self-pay | Admitting: Gastroenterology

## 2021-01-14 VITALS — BP 90/60 | HR 84 | Ht 63.5 in | Wt 195.0 lb

## 2021-01-14 DIAGNOSIS — I255 Ischemic cardiomyopathy: Secondary | ICD-10-CM

## 2021-01-14 DIAGNOSIS — R1310 Dysphagia, unspecified: Secondary | ICD-10-CM | POA: Diagnosis not present

## 2021-01-14 DIAGNOSIS — K219 Gastro-esophageal reflux disease without esophagitis: Secondary | ICD-10-CM

## 2021-01-14 DIAGNOSIS — R1013 Epigastric pain: Secondary | ICD-10-CM | POA: Diagnosis not present

## 2021-01-14 MED ORDER — FAMOTIDINE 40 MG PO TABS: 40.0000 mg | ORAL_TABLET | Freq: Every day | ORAL | 6 refills | Status: DC

## 2021-01-14 NOTE — Progress Notes (Unsigned)
Karen [facility]    [id number]    September 20, 1951  Primary Care Physician:[facility]  Referring Physician: Laurel Dimmer, Warner [address] [place] [facility] [employer],  [place] [address]   Chief complaint:  GERD, Nausea, regurgitation, epigastric abdominal pain  HPI:  70 year old very pleasant female with history of COPD, hypertension, hyperlipidemia, diabetes, CAD, ischemic cardiomyopathy with severe CHF s/p biventricular ICD in January 2022  Echocardiogram December 27, 2020: LVEF 20 to 25%, left ventricle has severely decreased function.  She has history of chronic GERD, was previously taking Nexium, stopped due to drug interaction with Plavix.  She was taking famotidine daily with persistent heartburn and was recently started on pantoprazole daily.  She has only taken it for past 2 days. Complains of intermittent dysphagia mostly with pills and meat.  She has epigastric abdominal discomfort, nausea, regurgitation worse after meals.  She had EGD, capsule endoscopy and colonoscopy in [place] by Dr. Jennye [facility] in 2019 for evaluation of anemia. colonoscopy October 15, 2018 showed evidence of left hemicolectomy with anastomosis 2 cm from the anal verge.  Diverticulosis. EGD October 25, 2018 showed gastritis, hiatal hernia.  Gastric biopsies negative for H. Pylori Small bowel video capsule October 07, 2018: AVM of the proximal jejunum, nonbleeding.  Colonoscopy October 07, 2019: 3 diminutive tubular adenomas removed, few scattered diverticula proximal to the anastomosis, left hemicolectomy for diverticular disease.  She has been receiving IV iron and also takes oral iron.  Baseline hemoglobin around 10. Denies any rectal bleeding or melena.  Outpatient Encounter Medications as of 01/14/2021  Medication Sig  . acetaminophen (TYLENOL) 325 MG tablet Take 2 tablets by mouth every 6 (six) hours as needed.  . ACETAMINOPHEN-BUTALBITAL 50-325 MG TABS Take 1  tablet by mouth every 4 (four) hours as needed.  Marland Kitchen albuterol (PROVENTIL) (2.5 MG/3ML) 0.083% nebulizer solution INL CONTENTS OF 1 VIAL VIA NEBULIZER Q 4 H PRF WHEEZING OR COUGH  . albuterol (VENTOLIN HFA) 108 (90 Base) MCG/ACT inhaler Inhale 2 puffs into the lungs every 6 (six) hours as needed.  Marland Kitchen aspirin 81 MG EC tablet 81 mg daily.  . baclofen (LIORESAL) 10 MG tablet Take 10 mg by mouth 2 (two) times daily.  . budesonide (PULMICORT) 1 MG/2ML nebulizer solution Take 1 mg by nebulization 2 (two) times daily as needed (shortness of breath).  . carvedilol (COREG) 3.125 MG tablet Take 1 tablet (3.125 mg total) by mouth 2 (two) times daily with a meal.  . clopidogrel (PLAVIX) 75 MG tablet Take 1 tablet (75 mg total) by mouth daily.  . COMBIVENT RESPIMAT 20-100 MCG/ACT AERS respimat Inhale 1 puff into the lungs 2 (two) times daily.  . dapagliflozin propanediol (FARXIGA) 10 MG TABS tablet Take 1 tablet (10 mg total) by mouth daily before breakfast.  . diclofenac Sodium (VOLTAREN) 1 % GEL Apply 2 g topically 2 (two) times daily as needed.  . dicyclomine (BENTYL) 10 MG capsule Take 10 mg by mouth every 6 (six) hours as needed for spasms.  . DULoxetine (CYMBALTA) 30 MG capsule Take 30 mg by mouth daily.  Marland Kitchen ENTRESTO 24-26 MG TAKE 1 TABLET BY MOUTH TWICE DAILY  . famotidine (PEPCID) 40 MG tablet Take 40 mg by mouth at bedtime.  . ferrous sulfate 324 MG TBEC Take 324 mg by mouth daily with breakfast.  . fluticasone (FLONASE) 50 MCG/ACT nasal spray Place into both nostrils.  . folic acid (FOLVITE) 1 MG tablet Take  1 mg by mouth daily.  . insulin lispro (HUMALOG) 100 UNIT/ML KwikPen Inject 16 Units into the skin 3 (three) times daily. Administer 16 units three times daily before a meal per sliding scale  . ketorolac (TORADOL) 10 MG tablet Take 1 tablet (10 mg total) by mouth every 6 (six) hours as needed for moderate pain.  Marland Kitchen latanoprost (XALATAN) 0.005 % ophthalmic solution   . loratadine (CLARITIN) 10 MG  tablet Take 10 mg by mouth daily.  . magnesium oxide (MAG-OX) 400 MG tablet Take 400 mg by mouth daily.  . nitroGLYCERIN (NITROSTAT) 0.4 MG SL tablet Place 1 tablet (0.4 mg total) under the tongue every 5 (five) minutes as needed for chest pain.  Marland Kitchen nystatin (MYCOSTATIN) 100000 UNIT/ML suspension Take 5 mLs by mouth 4 (four) times daily.  Marland Kitchen nystatin cream (MYCOSTATIN) Apply topically.  . pantoprazole (PROTONIX) 40 MG tablet Take 1 tablet (40 mg total) by mouth daily.  . potassium chloride (KLOR-CON) 10 MEQ tablet Take 10 mEq by mouth daily.  . pregabalin (LYRICA) 50 MG capsule Take 50 mg by mouth 3 (three) times daily.  . rosuvastatin (CRESTOR) 40 MG tablet Take 40 mg by mouth daily.  . SEREVENT DISKUS 50 MCG/DOSE diskus inhaler Inhale 1 puff into the lungs 2 (two) times daily.  Marland Kitchen SPIRIVA RESPIMAT 2.5 MCG/ACT AERS SMARTSIG:2 Puff(s) Via Inhaler Daily  . TRESIBA FLEXTOUCH 200 UNIT/ML FlexTouch Pen Inject 40 Units into the skin 2 (two) times daily.  . TRINTELLIX 5 MG TABS tablet Take 5 mg by mouth at bedtime.  . valACYclovir (VALTREX) 1000 MG tablet Take 1,000 mg by mouth 2 (two) times daily as needed (flares).  . vitamin B-12 (CYANOCOBALAMIN) 1000 MCG tablet Take 1,000 mcg by mouth daily.  . Vitamin D, Ergocalciferol, (DRISDOL) 1.25 MG (50000 UNIT) CAPS capsule Take 50,000 Units by mouth once a week. sundays  . WAL-DRYL ALLERGY 12.5 MG/5ML liquid Take 12.5 mg by mouth daily as needed for allergies or itching.  . [DISCONTINUED] torsemide (DEMADEX) 10 MG tablet Take one tablet every other day.   No facility-administered encounter medications on file as of 01/14/2021.    Allergies as of 01/14/2021 - Review Complete 12/31/2020  Allergen Reaction Noted  . Bee venom Anaphylaxis 02/01/2018  . Sumatriptan Other (See Comments) and Shortness Of Breath 11/02/2020  . Tramadol Hives 05/06/2019  . Amoxicillin-pot clavulanate Diarrhea, Nausea And Vomiting, and Other (See Comments) 11/08/2014  . Oxycodone  Itching and Hives 11/08/2014  . Buprenorphine hcl  04/19/2012  . Clarithromycin  04/19/2012  . Duloxetine hcl  04/19/2012  . Hydrocodone  04/19/2012  . Lactose  05/26/2018  . Liraglutide  04/19/2012  . Tramadol hcl  04/19/2012    Past Medical History:  Diagnosis Date  . Arthralgia 01/14/2020  . Atherosclerotic heart disease of native coronary artery without angina pectoris 04/13/2014  . CHF (congestive heart failure) (HCC)   . Chronic bilateral low back pain with bilateral sciatica 01/13/2019  . Chronic bladder pain 11/08/2014   Last Assessment & Plan:  Formatting of this note might be different from the original. Patient has chronic pain syndrome in general I think this is unfortunately worsening her pelvic pain and bladder pain her examination today was very reassuring I am advised her to use the estrogen cream I did start her on Elavil 10 milligrams at that time if she does tolerated will increase it to 25 milligrams.   . Chronic idiopathic constipation 12/22/2014   Formatting of this note might be different from  the original. Last Assessment & Plan:  For better bowel emptying please use either Citrucel / Benefiber start with 2 tablespoons daily and titrate up or down to effect.  Also please use glycerin suppositories as needed to assist in evacuation. Last Assessment & Plan:  Formatting of this note might be different from the original. For better bowel empt  . Chronic obstructive pulmonary disease, unspecified (HCC) 03/18/2018  . Class 1 obesity due to excess calories with serious comorbidity and body mass index (BMI) of 31.0 to 31.9 in adult 06/26/2020  . Coronary artery disease   . DDD (degenerative disc disease), cervical 01/13/2019  . Depression 11/05/2019  . Diabetic polyneuropathy associated with type 2 diabetes mellitus (HCC) 02/24/2019  . Diverticulitis 05/20/2018  . Diverticulitis of colon 03/18/2018  . Family history of ischemic heart disease (IHD) 08/16/2013  . Fibromyalgia affecting  multiple sites 01/14/2020  . Gastro-esophageal reflux disease without esophagitis 01/13/2019  . Glaucoma 11/08/2014  . History of anemia 12/12/2019  . Hordeolum externum of left upper eyelid 03/13/2020  . Hypertension 11/08/2014  . Iron deficiency anemia 01/13/2019  . Left renal mass 11/08/2019  . Leukocytosis 05/28/2018  . Low vitamin B12 level 03/13/2020  . Low vitamin D level 12/12/2019  . LV dysfunction 05/31/2019  . Malignant essential hypertension 01/22/2016  . Migraine, unspecified, not intractable, without status migrainosus 11/08/2014  . Mixed hyperlipidemia 01/13/2019  . Myocardial infarction (HCC)   . Other premature beats 11/08/2014  . 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 PVD (peripheral vascular disease) (HCC) 04/08/2017  . 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 Trochanteric bursitis of right hip 04/20/2019  . Type 2 diabetes mellitus without complications (HCC) 12/08/2013  . Vaginal atrophy 11/08/2014   Formatting of this note might be different from the original. Last Assessment & Plan:  For vaginal atrophy please place a pea size dab of Estrogen vaginal cream  into the vagina 3 times a week ( Monday, Wednesday, Friday) Last Assessment & Plan:  Formatting of this note might be different from the original. For vaginal atrophy please place a pea size dab of Estrogen vaginal cream  into the vagina     Past Surgical History:  Procedure Laterality Date  . ABDOMINAL HYSTERECTOMY    . BIV ICD INSERTION CRT-D N/A 12/31/2020   Procedure: BIV ICD INSERTION CRT-D;  Surgeon: Marinus Maw, MD;  Location: [facility] INVASIVE CV LAB;  Service: Cardiovascular;  Laterality: N/A;  . BLEPHAROPLASTY Bilateral   . COLON SURGERY    . CORONARY ANGIOPLASTY WITH STENT PLACEMENT  2020   X3  . CORONARY STENT INTERVENTION N/A 08/03/2020   Procedure: CORONARY STENT INTERVENTION;  Surgeon: Tonny Bollman, MD;  Location[facility] INVASIVE CV LAB;  Service: Cardiovascular;  Laterality: N/A;   . INTRAVASCULAR PRESSURE WIRE/FFR STUDY N/A 08/03/2020   Procedure: INTRAVASCULAR PRESSURE WIRE/FFR STUDY;  Surgeon: Tonny Bollman, MD;  Location[facility] INVASIVE CV LAB;  Service: Cardiovascular;  Laterality: N/A;  . LEFT HEART CATH AND CORONARY ANGIOGRAPHY N/A 08/03/2020   Procedure: LEFT HEART CATH AND CORONARY ANGIOGRAPHY;  Surgeon: Tonny Bollman, MD;  Location: [facility] INVASIVE CV LAB;  Service: Cardiovascular;  Laterality: N/A;  . REFRACTIVE SURGERY Left    [place]    Family History  Problem Relation Age of Onset  . Lung cancer Mother   . Coronary artery disease Mother   . Diabetes Mother   . Glaucoma Mother   . Hypertension Mother   . Migraines Father   .  Diabetes Maternal Grandmother   . Diabetes Daughter   . Migraines Son     Social History   Socioeconomic History  . Marital status: Married    Spouse name: Not on file  . Number of children: Not on file  . Years of education: Not on file  . Highest education level: Not on file  Occupational History  . Not on file  Tobacco Use  . Smoking status: Former Games developer  . Smokeless tobacco: Never Used  Vaping Use  . Vaping Use: Never used  Substance and Sexual Activity  . Alcohol use: Never  . Drug use: Never  . Sexual activity: Not on file  Other Topics Concern  . Not on file  Social History Narrative  . Not on file   Social Determinants of Health   Financial Resource Strain: Not on file  Food Insecurity: Not on file  Transportation Needs: Not on file  Physical Activity: Not on file  Stress: Not on file  Social Connections: Not on file  Intimate Partner Violence: Not on file      Review of systems: All other review of systems negative except as mentioned in the HPI.   Physical Exam: Vitals:   01/14/21 1049  BP: 90/60  Pulse: 84   Body mass index is 34 kg/m. Gen:      No acute distress HEENT:  sclera anicteric Abd:      soft, non-tender; no palpable masses, no distension Ext:    No edema Neuro: alert  and oriented x 3 Psych: normal mood and affect  Data Reviewed:  Reviewed labs, radiology imaging, old records and pertinent past GI work up   Assessment and Plan/Recommendations:  70 year old very pleasant female with history of type 2 diabetes, hypertension, hyperlipidemia, CAD, ischemic cardiomyopathy with severe CHF s/p biventricular ICD and chronic GERD here with complaints of intermittent dysphagia, epigastric abdominal pain, nausea and regurgitation  Symptoms could be secondary to uncontrolled GERD, will obtain upper GI series to further evaluate exclude erosive esophagitis, peptic stricture or neoplastic lesion.  She has history of hiatal hernia based on recent EGD in 2019 Continue pantoprazole 40 mg daily and Pepcid at bedtime as needed Discussed antireflux measures  Chronic iron deficiency anemia: History of small bowel AVMs.  She is followed by hematology and is on IV iron infusion  History of colonic diverticulosis: S/p left hemicolectomy for diverticular disease.  History of adenomatous colon polyps, she is up-to-date with colorectal cancer screening.  Return in 4 to 6 weeks or sooner if needed  This visit required 60 minutes of patient care (this includes precharting, chart review, review of results, face-to-face time used for counseling as well as treatment plan and follow-up. The patient was provided an opportunity to ask questions and all were answered. The patient agreed with the plan and demonstrated an understanding of the instructions.  Iona Beard , MD    CC: Selena Batten *

## 2021-01-14 NOTE — Patient Instructions (Signed)
You have been scheduled for an Upper GI Series at [facility] . Your appointment is on 01/22/2021 at 9:30am. Please arrive 15 minutes prior to your test for registration. Make sure not to eat or drink anything after midnight on the night before your test. If you need to reschedule, please call radiology at [phone]. ________________________________________________________________ An upper GI series uses x rays to help diagnose problems of the upper GI tract, which includes the esophagus, stomach, and duodenum. The duodenum is the first part of the small intestine. An upper GI series is conducted by a radiology technologist or a radiologist-a doctor who specializes in x-ray imaging-at a hospital or outpatient center. While sitting or standing in front of an x-ray machine, the patient drinks barium liquid, which is often white and has a chalky consistency and taste. The barium liquid coats the lining of the upper GI tract and makes signs of disease show up more clearly on x rays. X-ray video, called fluoroscopy, is used to view the barium liquid moving through the esophagus, stomach, and duodenum. Additional x rays and fluoroscopy are performed while the patient lies on an x-ray table. To fully coat the upper GI tract with barium liquid, the technologist or radiologist may press on the abdomen or ask the patient to change position. Patients hold still in various positions, allowing the technologist or radiologist to take x rays of the upper GI tract at different angles. If a technologist conducts the upper GI series, a radiologist will later examine the images to look for problems.  This test typically takes about 1 hour to complete. __________________________________________________________________   Continue Protonix 40 mg daily  Take Pepcid at bedtime as needed   [employer]'s Current Therapy 2021 (pp. 213-216). [place], [place]: [employer].">  Gastroesophageal Reflux Disease,  Adult Gastroesophageal reflux (GER) happens when acid from the stomach flows up into the tube that connects the mouth and the stomach (esophagus). Normally, food travels down the esophagus and stays in the stomach to be digested. However, when a person has GER, food and stomach acid sometimes move back up into the esophagus. If this becomes a more serious problem, the person may be diagnosed with a disease called gastroesophageal reflux disease (GERD). GERD occurs when the reflux:  Happens often.  Causes frequent or severe symptoms.  Causes problems such as damage to the esophagus. When stomach acid comes in contact with the esophagus, the acid may cause inflammation in the esophagus. Over time, GERD may create small holes (ulcers) in the lining of the esophagus. What are the causes? This condition is caused by a problem with the muscle between the esophagus and the stomach (lower esophageal sphincter, or LES). Normally, the LES muscle closes after food passes through the esophagus to the stomach. When the LES is weakened or abnormal, it does not close properly, and that allows food and stomach acid to go back up into the esophagus. The LES can be weakened by certain dietary substances, medicines, and medical conditions, including:  Tobacco use.  Pregnancy.  Having a hiatal hernia.  Alcohol use.  Certain foods and beverages, such as coffee, chocolate, onions, and peppermint. What increases the risk? You are more likely to develop this condition if you:  Have an increased body weight.  Have a connective tissue disorder.  Take NSAIDs, such as ibuprofen. What are the signs or symptoms? Symptoms of this condition include:  Heartburn.  Difficult or painful swallowing and the feeling of having a lump in the throat.  A  bitter taste in the mouth.  Bad breath and having a large amount of saliva.  Having an upset or bloated stomach and belching.  Chest pain. Different conditions can  cause chest pain. Make sure you see your health care provider if you experience chest pain.  Shortness of breath or wheezing.  Ongoing (chronic) cough or a nighttime cough.  Wearing away of tooth enamel.  Weight loss. How is this diagnosed? This condition may be diagnosed based on a medical history and a physical exam. To determine if you have mild or severe GERD, your health care provider may also monitor how you respond to treatment. You may also have tests, including:  A test to examine your stomach and esophagus with a small camera (endoscopy).  A test that measures the acidity level in your esophagus.  A test that measures how much pressure is on your esophagus.  A barium swallow or modified barium swallow test to show the shape, size, and functioning of your esophagus. How is this treated? Treatment for this condition may vary depending on how severe your symptoms are. Your health care provider may recommend:  Changes to your diet.  Medicine.  Surgery. The goal of treatment is to help relieve your symptoms and to prevent complications. Follow these instructions at home: Eating and drinking  Follow a diet as recommended by your health care provider. This may involve avoiding foods and drinks such as: ? Coffee and tea, with or without caffeine. ? Drinks that contain alcohol. ? Energy drinks and sports drinks. ? Carbonated drinks or sodas. ? Chocolate and cocoa. ? Peppermint and mint flavorings. ? Garlic and onions. ? Horseradish. ? Spicy and acidic foods, including peppers, chili powder, curry powder, vinegar, hot sauces, and barbecue sauce. ? Citrus fruit juices and citrus fruits, such as oranges, lemons, and limes. ? Tomato-based foods, such as red sauce, chili, salsa, and pizza with red sauce. ? Fried and fatty foods, such as donuts, french fries, potato chips, and high-fat dressings. ? High-fat meats, such as hot dogs and fatty cuts of red and white meats, such as  rib eye steak, sausage, ham, and bacon. ? High-fat dairy items, such as whole milk, butter, and cream cheese.  Eat small, frequent meals instead of large meals.  Avoid drinking large amounts of liquid with your meals.  Avoid eating meals during the 2-3 hours before bedtime.  Avoid lying down right after you eat.  Do not exercise right after you eat.   Lifestyle  Do not use any products that contain nicotine or tobacco. These products include cigarettes, chewing tobacco, and vaping devices, such as e-cigarettes. If you need help quitting, ask your health care provider.  Try to reduce your stress by using methods such as yoga or meditation. If you need help reducing stress, ask your health care provider.  If you are overweight, reduce your weight to an amount that is healthy for you. Ask your health care provider for guidance about a safe weight loss goal.   General instructions  Pay attention to any changes in your symptoms.  Take over-the-counter and prescription medicines only as told by your health care provider. Do not take aspirin, ibuprofen, or other NSAIDs unless your health care provider told you to take these medicines.  Wear loose-fitting clothing. Do not wear anything tight around your waist that causes pressure on your abdomen.  Raise (elevate) the head of your bed about 6 inches (15 cm). You can use a wedge to do this.  Avoid bending over if this makes your symptoms worse.  Keep all follow-up visits. This is important. Contact a health care provider if:  You have: ? New symptoms. ? Unexplained weight loss. ? Difficulty swallowing or it hurts to swallow. ? Wheezing or a persistent cough. ? A hoarse voice.  Your symptoms do not improve with treatment. Get help right away if:  You have sudden pain in your arms, neck, jaw, teeth, or back.  You suddenly feel sweaty, dizzy, or light-headed.  You have chest pain or shortness of breath.  You vomit and the vomit  is green, yellow, or black, or it looks like blood or coffee grounds.  You faint.  You have stool that is red, bloody, or black.  You cannot swallow, drink, or eat. These symptoms may represent a serious problem that is an emergency. Do not wait to see if the symptoms will go away. Get medical help right away. Call your local emergency services (911 in the [place] Do not drive yourself to the hospital. Summary  Gastroesophageal reflux happens when acid from the stomach flows up into the esophagus. GERD is a disease in which the reflux happens often, causes frequent or severe symptoms, or causes problems such as damage to the esophagus.  Treatment for this condition may vary depending on how severe your symptoms are. Your health care provider may recommend diet and lifestyle changes, medicine, or surgery.  Contact a health care provider if you have new or worsening symptoms.  Take over-the-counter and prescription medicines only as told by your health care provider. Do not take aspirin, ibuprofen, or other NSAIDs unless your health care provider told you to do so.  Keep all follow-up visits as told by your health care provider. This is important. This information is not intended to replace advice given to you by your health care provider. Make sure you discuss any questions you have with your health care provider. Document Revised: 05/28/2020 Document Reviewed: 05/28/2020 Elsevier Patient Education  2021 [employer].   I appreciate the  opportunity to care for you  Thank You   Marsa Aris , MD

## 2021-01-15 ENCOUNTER — Ambulatory Visit (INDEPENDENT_AMBULATORY_CARE_PROVIDER_SITE_OTHER): Payer: Medicare Other | Admitting: Emergency Medicine

## 2021-01-15 ENCOUNTER — Other Ambulatory Visit: Payer: Self-pay

## 2021-01-15 ENCOUNTER — Encounter: Payer: Self-pay | Admitting: Gastroenterology

## 2021-01-15 DIAGNOSIS — I255 Ischemic cardiomyopathy: Secondary | ICD-10-CM

## 2021-01-15 DIAGNOSIS — K589 Irritable bowel syndrome without diarrhea: Secondary | ICD-10-CM | POA: Insufficient documentation

## 2021-01-15 LAB — CUP PACEART INCLINIC DEVICE CHECK
Brady Statistic RA Percent Paced: 1 % — CL
Brady Statistic RV Percent Paced: 100 %
Date Time Interrogation Session: 20220215131132
HighPow Impedance: 62 Ohm
Implantable Lead Implant Date: 20220131
Implantable Lead Implant Date: 20220131
Implantable Lead Implant Date: 20220131
Implantable Lead Location: 753858
Implantable Lead Location: 753859
Implantable Lead Location: 753860
Implantable Lead Model: 137
Implantable Lead Model: 3830
Implantable Lead Model: 7840
Implantable Lead Serial Number: 1047234
Implantable Lead Serial Number: 301179
Implantable Pulse Generator Implant Date: 20220131
Lead Channel Impedance Value: 583 Ohm
Lead Channel Impedance Value: 674 Ohm
Lead Channel Impedance Value: 729 Ohm
Lead Channel Pacing Threshold Amplitude: 0.5 V
Lead Channel Pacing Threshold Amplitude: 0.8 V
Lead Channel Pacing Threshold Amplitude: 1 V
Lead Channel Pacing Threshold Pulse Width: 0.1 ms
Lead Channel Pacing Threshold Pulse Width: 0.4 ms
Lead Channel Pacing Threshold Pulse Width: 0.4 ms
Lead Channel Sensing Intrinsic Amplitude: 20.5 mV
Lead Channel Sensing Intrinsic Amplitude: 3 mV
Lead Channel Sensing Intrinsic Amplitude: 8.3 mV
Lead Channel Setting Pacing Amplitude: 0.1 V
Lead Channel Setting Pacing Amplitude: 3.5 V
Lead Channel Setting Pacing Amplitude: 3.5 V
Lead Channel Setting Pacing Pulse Width: 0.1 ms
Lead Channel Setting Pacing Pulse Width: 0.4 ms
Lead Channel Setting Sensing Sensitivity: 0.5 mV
Lead Channel Setting Sensing Sensitivity: 1 mV
Pulse Gen Serial Number: 149649

## 2021-01-15 NOTE — Progress Notes (Signed)
Wound check appointment. Steri-strips removed. Wound without redness or edema. Incision edges approximated, wound well healed.   Device check completed with industry.   Normal device function.  Thresholds, sensing, and impedances consistent with implant measurements. Device programmed for LV only pacing, RV currently set subthreshold.   RA and LV programmed at 3.5V for extra safety margin until 3 month visit. Histogram distribution appropriate for patient and level of activity. No mode switches or ventricular arrhythmias noted. Patient educated about wound care, arm mobility, lifting restrictions, shock plan. Patient is enrolled in remote monitoring, new monitor provided by industry rep today.  Next scheduled check 04/05/21.  ROV with Dr. Ladona Ridgel on 04/03/21.

## 2021-01-16 ENCOUNTER — Ambulatory Visit (INDEPENDENT_AMBULATORY_CARE_PROVIDER_SITE_OTHER): Payer: Medicare Other | Admitting: Cardiology

## 2021-01-16 ENCOUNTER — Encounter: Payer: Self-pay | Admitting: Cardiology

## 2021-01-16 VITALS — BP 106/64 | HR 82 | Ht 63.5 in | Wt 194.0 lb

## 2021-01-16 DIAGNOSIS — Z95810 Presence of automatic (implantable) cardiac defibrillator: Secondary | ICD-10-CM

## 2021-01-16 DIAGNOSIS — I447 Left bundle-branch block, unspecified: Secondary | ICD-10-CM

## 2021-01-16 DIAGNOSIS — E13319 Other specified diabetes mellitus with unspecified diabetic retinopathy without macular edema: Secondary | ICD-10-CM

## 2021-01-16 DIAGNOSIS — I739 Peripheral vascular disease, unspecified: Secondary | ICD-10-CM | POA: Diagnosis not present

## 2021-01-16 DIAGNOSIS — E669 Obesity, unspecified: Secondary | ICD-10-CM

## 2021-01-16 DIAGNOSIS — E1142 Type 2 diabetes mellitus with diabetic polyneuropathy: Secondary | ICD-10-CM

## 2021-01-16 DIAGNOSIS — I10 Essential (primary) hypertension: Secondary | ICD-10-CM | POA: Diagnosis not present

## 2021-01-16 DIAGNOSIS — I255 Ischemic cardiomyopathy: Secondary | ICD-10-CM

## 2021-01-16 DIAGNOSIS — I2510 Atherosclerotic heart disease of native coronary artery without angina pectoris: Secondary | ICD-10-CM | POA: Diagnosis not present

## 2021-01-16 NOTE — Progress Notes (Signed)
Cardiology Office Note:    Date:  01/16/2021   ID:  Karen Warner, DOB 03-09-1951, MRN [id number]  PCP:  Laurel Dimmer, FNP  Cardiologist:  Thomasene Ripple, DO  Electrophysiologist:  None   Referring MD: Selena Batten *   Chief Complaint  Patient presents with  . Hospitalization Follow-up   History of Present Illness:    Karen Warner is a 70 y.o. female with a hx of coronary artery disease status post PCI, ischemic cardiomyopathy status post BiV pacemaker during her hospitalization in January 2022 presented for syncope episode, heart failure reduced ejection fraction recent EF 20 to 25% is here today for follow-up visit.  Patient tells me that yesterday she had an episode where she was sleeping in her chair and fell face forward.  She is not sure what really happened.  And has called the device clinic to have a repeat check.  She has no shortness of breath no chest pain.  She is experiencing some depression giving her cardiac disease and has been seeing a therapist.    Past Medical History:  Diagnosis Date  . Anemia 12/12/2019  . Arthralgia 01/14/2020  . Arthritis   . Asthma   . Atherosclerotic heart disease of native coronary artery without angina pectoris 04/13/2014  . CHF (congestive heart failure) (HCC)   . Chronic bilateral low back pain with bilateral sciatica 01/13/2019  . Chronic bladder pain 11/08/2014   Last Assessment & Plan:  Formatting of this note might be different from the original. Patient has chronic pain syndrome in general I think this is unfortunately worsening her pelvic pain and bladder pain her examination today was very reassuring I am advised her to use the estrogen cream I did start her on Elavil 10 milligrams at that time if she does tolerated will increase it to 25 milligrams.   . Chronic headaches   . Chronic idiopathic constipation 12/22/2014   Formatting of this note might be different from the original. Last  Assessment & Plan:  For better bowel emptying please use either Citrucel / Benefiber start with 2 tablespoons daily and titrate up or down to effect.  Also please use glycerin suppositories as needed to assist in evacuation. Last Assessment & Plan:  Formatting of this note might be different from the original. For better bowel empt  . Chronic obstructive pulmonary disease, unspecified (HCC) 03/18/2018  . Class 1 obesity due to excess calories with serious comorbidity and body mass index (BMI) of 31.0 to 31.9 in adult 06/26/2020  . Colon polyps   . COPD (chronic obstructive pulmonary disease) (HCC)   . Coronary artery disease   . DDD (degenerative disc disease), cervical 01/13/2019  . Depression 11/05/2019  . Diabetic polyneuropathy associated with type 2 diabetes mellitus (HCC) 02/24/2019  . Diverticulitis 05/20/2018  . Diverticulitis of colon 03/18/2018  . Family history of ischemic heart disease (IHD) 08/16/2013  . Fibromyalgia affecting multiple sites 01/14/2020  . Gastro-esophageal reflux disease without esophagitis 01/13/2019  . Glaucoma 11/08/2014  . Hordeolum externum of left upper eyelid 03/13/2020  . Hypertension 11/08/2014  . IBS (irritable bowel syndrome)   . Iron deficiency anemia 01/13/2019  . Left renal mass 11/08/2019  . Leukocytosis 05/28/2018  . Low vitamin B12 level 03/13/2020  . Low vitamin D level 12/12/2019  . LV dysfunction 05/31/2019  . Malignant essential hypertension 01/22/2016  . Migraine, unspecified, not intractable, without status migrainosus 11/08/2014  . Mixed hyperlipidemia 01/13/2019  . Myocardial infarction (HCC)   .  Other premature beats 11/08/2014  . 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 PVD (peripheral vascular disease) (HCC) 04/08/2017  . 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 Trochanteric bursitis of right hip 04/20/2019  . Type 2 diabetes mellitus without complications (HCC) 12/08/2013  . Vaginal atrophy 11/08/2014   Formatting of  this note might be different from the original. Last Assessment & Plan:  For vaginal atrophy please place a pea size dab of Estrogen vaginal cream  into the vagina 3 times a week ( Monday, Wednesday, Friday) Last Assessment & Plan:  Formatting of this note might be different from the original. For vaginal atrophy please place a pea size dab of Estrogen vaginal cream  into the vagina     Past Surgical History:  Procedure Laterality Date  . ABDOMINAL HYSTERECTOMY    . BIV ICD INSERTION CRT-D N/A 12/31/2020   Procedure: BIV ICD INSERTION CRT-D;  Surgeon: Marinus Maw, MD;  Location: [facility] INVASIVE CV LAB;  Service: Cardiovascular;  Laterality: N/A;  . BLEPHAROPLASTY Bilateral   . COLON SURGERY    . CORONARY ANGIOPLASTY WITH STENT PLACEMENT  2020   X3  . CORONARY STENT INTERVENTION N/A 08/03/2020   Procedure: CORONARY STENT INTERVENTION;  Surgeon: Tonny Bollman, MD;  Location: [facility] INVASIVE CV LAB;  Service: Cardiovascular;  Laterality: N/A;  . INTRAVASCULAR PRESSURE WIRE/FFR STUDY N/A 08/03/2020   Procedure: INTRAVASCULAR PRESSURE WIRE/FFR STUDY;  Surgeon: Tonny Bollman, MD;  Location: [facility] INVASIVE CV LAB;  Service: Cardiovascular;  Laterality: N/A;  . LEFT HEART CATH AND CORONARY ANGIOGRAPHY N/A 08/03/2020   Procedure: LEFT HEART CATH AND CORONARY ANGIOGRAPHY;  Surgeon: Tonny Bollman, MD;  Location: [facility] INVASIVE CV LAB;  Service: Cardiovascular;  Laterality: N/A;  . REFRACTIVE SURGERY Left    [place]    Current Medications: Current Meds  Medication Sig  . acetaminophen (TYLENOL) 325 MG tablet Take 2 tablets by mouth every 6 (six) hours as needed.  . ACETAMINOPHEN-BUTALBITAL 50-325 MG TABS Take 1 tablet by mouth every 4 (four) hours as needed.  Marland Kitchen albuterol (PROVENTIL) (2.5 MG/3ML) 0.083% nebulizer solution INL CONTENTS OF 1 VIAL VIA NEBULIZER Q 4 H PRF WHEEZING OR COUGH  . albuterol (VENTOLIN HFA) 108 (90 Base) MCG/ACT inhaler Inhale 2 puffs into the lungs every 6 (six) hours as needed.  Marland Kitchen  aspirin 81 MG EC tablet 81 mg daily.  . baclofen (LIORESAL) 10 MG tablet Take 10 mg by mouth 2 (two) times daily.  . budesonide (PULMICORT) 1 MG/2ML nebulizer solution Take 1 mg by nebulization 2 (two) times daily as needed (shortness of breath).  . carvedilol (COREG) 3.125 MG tablet Take 1 tablet (3.125 mg total) by mouth 2 (two) times daily with a meal.  . clopidogrel (PLAVIX) 75 MG tablet Take 1 tablet (75 mg total) by mouth daily.  . COMBIVENT RESPIMAT 20-100 MCG/ACT AERS respimat Inhale 1 puff into the lungs 2 (two) times daily.  . dapagliflozin propanediol (FARXIGA) 10 MG TABS tablet Take 1 tablet (10 mg total) by mouth daily before breakfast.  . diclofenac Sodium (VOLTAREN) 1 % GEL Apply 2 g topically 2 (two) times daily as needed.  . dicyclomine (BENTYL) 10 MG capsule Take 10 mg by mouth every 6 (six) hours as needed for spasms.  Marland Kitchen ENTRESTO 24-26 MG TAKE 1 TABLET BY MOUTH TWICE DAILY  . famotidine (PEPCID) 40 MG tablet Take 1 tablet (40 mg total) by mouth at bedtime.  . ferrous sulfate 324 MG TBEC Take  324 mg by mouth daily with breakfast.  . fluticasone (FLONASE) 50 MCG/ACT nasal spray Place into both nostrils.  . insulin lispro (HUMALOG) 100 UNIT/ML KwikPen Inject 16 Units into the skin 3 (three) times daily. Administer 16 units three times daily before a meal per sliding scale  . latanoprost (XALATAN) 0.005 % ophthalmic solution   . loratadine (CLARITIN) 10 MG tablet Take 10 mg by mouth daily.  . magnesium oxide (MAG-OX) 400 MG tablet Take 400 mg by mouth daily.  . nitroGLYCERIN (NITROSTAT) 0.4 MG SL tablet Place 1 tablet (0.4 mg total) under the tongue every 5 (five) minutes as needed for chest pain.  Marland Kitchen nystatin (MYCOSTATIN) 100000 UNIT/ML suspension Take 5 mLs by mouth 4 (four) times daily.  . pantoprazole (PROTONIX) 40 MG tablet Take 1 tablet (40 mg total) by mouth daily.  . potassium chloride (KLOR-CON) 10 MEQ tablet Take 10 mEq by mouth daily.  . pregabalin (LYRICA) 50 MG capsule  Take 50 mg by mouth 3 (three) times daily.  . rosuvastatin (CRESTOR) 40 MG tablet Take 40 mg by mouth daily.  . SEREVENT DISKUS 50 MCG/DOSE diskus inhaler Inhale 1 puff into the lungs 2 (two) times daily.  Evaristo Bury FLEXTOUCH 200 UNIT/ML FlexTouch Pen Inject 40 Units into the skin 2 (two) times daily.  . TRINTELLIX 5 MG TABS tablet Take 5 mg by mouth at bedtime.  . valACYclovir (VALTREX) 1000 MG tablet Take 1,000 mg by mouth 2 (two) times daily as needed (flares).  . vitamin B-12 (CYANOCOBALAMIN) 1000 MCG tablet Take 1,000 mcg by mouth daily.  . Vitamin D, Ergocalciferol, (DRISDOL) 1.25 MG (50000 UNIT) CAPS capsule Take 50,000 Units by mouth once a week. sundays  . WAL-DRYL ALLERGY 12.5 MG/5ML liquid Take 12.5 mg by mouth daily as needed for allergies or itching.     Allergies:   Bee venom, Sumatriptan, Tramadol, Amoxicillin-pot clavulanate, Oxycodone, Buprenorphine hcl, Clarithromycin, Duloxetine hcl, Hydrocodone, Lactose, Liraglutide, and Tramadol hcl   Social History   Socioeconomic History  . Marital status: Married    Spouse name: Not on file  . Number of children: 3  . Years of education: Not on file  . Highest education level: Not on file  Occupational History  . Occupation: retired  Tobacco Use  . Smoking status: Former Smoker    Types: Cigarettes    Quit date: 2021    Years since quitting: 1.1  . Smokeless tobacco: Never Used  Vaping Use  . Vaping Use: Never used  Substance and Sexual Activity  . Alcohol use: Never  . Drug use: Never  . Sexual activity: Not on file  Other Topics Concern  . Not on file  Social History Narrative  . Not on file   Social Determinants of Health   Financial Resource Strain: Not on file  Food Insecurity: Not on file  Transportation Needs: Not on file  Physical Activity: Not on file  Stress: Not on file  Social Connections: Not on file     Family History: The patient's family history includes Colon polyps in her mother; Coronary  artery disease in her mother; Diabetes in her daughter, maternal grandmother, and mother; Glaucoma in her mother; Heart disease in her father; Hypertension in her brother and mother; Lung cancer in her mother; Migraines in her father and son.  ROS:   Review of Systems  Constitution: Negative for decreased appetite, fever and weight gain.  HENT: Negative for congestion, ear discharge, hoarse voice and sore throat.   Eyes: Negative  for discharge, redness, vision loss in right eye and visual halos.  Cardiovascular: Negative for chest pain, dyspnea on exertion, leg swelling, orthopnea and palpitations.  Respiratory: Negative for cough, hemoptysis, shortness of breath and snoring.   Endocrine: Negative for heat intolerance and polyphagia.  Hematologic/Lymphatic: Negative for bleeding problem. Does not bruise/bleed easily.  Skin: Negative for flushing, nail changes, rash and suspicious lesions.  Musculoskeletal: Negative for arthritis, joint pain, muscle cramps, myalgias, neck pain and stiffness.  Gastrointestinal: Negative for abdominal pain, bowel incontinence, diarrhea and excessive appetite.  Genitourinary: Negative for decreased libido, genital sores and incomplete emptying.  Neurological: Negative for brief paralysis, focal weakness, headaches and loss of balance.  Psychiatric/Behavioral: Negative for altered mental status, depression and suicidal ideas.  Allergic/Immunologic: Negative for HIV exposure and persistent infections.    EKGs/Labs/Other Studies Reviewed:    The following studies were reviewed today:   EKG:  The ekg ordered today demonstrates   Recent Labs: 07/13/2020: B Natriuretic Peptide 209.8 12/27/2020: ALT 11 12/28/2020: Magnesium 2.0; TSH 1.606 12/30/2020: BUN 14; Creatinine, Ser 1.16; Hemoglobin 11.3; Platelets 313; Potassium 3.8; Sodium 137  Recent Lipid Panel No results found for: CHOL, TRIG, HDL, CHOLHDL, VLDL, LDLCALC, LDLDIRECT  Physical Exam:    VS:  BP 106/64  (BP Location: Right Arm, Patient Position: Sitting, Cuff Size: Normal)   Pulse 82   Ht 5' 3.5" (1.613 m)   Wt 194 lb (88 kg)   SpO2 97%   BMI 33.83 kg/m     Wt Readings from Last 3 Encounters:  01/16/21 194 lb (88 kg)  01/14/21 195 lb (88.5 kg)  01/01/21 189 lb 3.2 oz (85.8 kg)     GEN: Well nourished, well developed in no acute distress HEENT: Normal NECK: No JVD; No carotid bruits LYMPHATICS: No lymphadenopathy CARDIAC: S1S2 noted,RRR, no murmurs, rubs, gallops RESPIRATORY:  Clear to auscultation without rales, wheezing or rhonchi  ABDOMEN: Soft, non-tender, non-distended, +bowel sounds, no guarding. EXTREMITIES: No edema, No cyanosis, no clubbing MUSCULOSKELETAL:  No deformity  SKIN: Warm and dry NEUROLOGIC:  Alert and oriented x 3, non-focal PSYCHIATRIC:  Normal affect, good insight  ASSESSMENT:    1. Ischemic cardiomyopathy   2. Atherosclerosis of native coronary artery of native heart without angina pectoris   3. PVD (peripheral vascular disease) (HCC)   4. Hypertension, unspecified type   5. Left bundle branch block   6. 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 Diabetic polyneuropathy associated with type 2 diabetes mellitus (HCC)   8. Biventricular automatic implantable cardioverter defibrillator in situ   9. Obesity (BMI 30-39.9)    PLAN:     1.  She does have some bilateral leg edema however her weight has been steady her dry weight is 193.  She takes her torsemide 10 mg every other day with potassium supplement.  We are going to continue this for now.  2.  No anginal symptoms continue the patient on her dual antiplatelet therapy with aspirin and Plavix.  Along with her lipid-lowering agents.  3.  In terms of her ischemic cardiomyopathy she is status post BiV ICD.  Continue patient on her medical regimen which includes Entresto, Coreg  4.  Blood work will be done today BMP and mag.  The patient is in agreement with the above plan. The patient left the  office in stable condition.  The patient will follow up in 8 weeks or sooner if needed.   Medication Adjustments/Labs and Tests Ordered: Current medicines are reviewed at  length with the patient today.  Concerns regarding medicines are outlined above.  Orders Placed This Encounter  Procedures  . Basic metabolic panel  . Magnesium   No orders of the defined types were placed in this encounter.   Patient Instructions  Medication Instructions:  Your physician recommends that you continue on your current medications as directed. Please refer to the Current Medication list given to you today.  *If you need a refill on your cardiac medications before your next appointment, please call your pharmacy*   Lab Work: Your physician recommends that you return for lab work: TODAY BMET, Mag  If you have labs (blood work) drawn today and your tests are completely normal, you will receive your results only by: Marland Kitchen MyChart Message (if you have MyChart) OR . A paper copy in the mail If you have any lab test that is abnormal or we need to change your treatment, we will call you to review the results.   Testing/Procedures: None   Follow-Up: At [facility], you and your health needs are our priority.  As part of our continuing mission to provide you with exceptional heart care, we have created designated Provider Care Teams.  These Care Teams include your primary Cardiologist (physician) and Advanced Practice Providers (APPs -  Physician Assistants and Nurse Practitioners) who all work together to provide you with the care you need, when you need it.  We recommend signing up for the patient portal called "MyChart".  Sign up information is provided on this After Visit Summary.  MyChart is used to connect with patients for Virtual Visits (Telemedicine).  Patients are able to view lab/test results, encounter notes, upcoming appointments, etc.  Non-urgent messages can be sent to your provider as well.    To learn more about what you can do with MyChart, go to [email or web address].    Your next appointment:   8 week(s)  The format for your next appointment:   In Person  Provider:   Thomasene Ripple, DO   Other Instructions      Adopting a Healthy Lifestyle.  Know what a healthy weight is for you (roughly BMI <25) and aim to maintain this   Aim for 7+ servings of fruits and vegetables daily   65-80+ fluid ounces of water or unsweet tea for healthy kidneys   Limit to max 1 drink of alcohol per day; avoid smoking/tobacco   Limit animal fats in diet for cholesterol and heart health - choose grass fed whenever available   Avoid highly processed foods, and foods high in saturated/trans fats   Aim for low stress - take time to unwind and care for your mental health   Aim for 150 min of moderate intensity exercise weekly for heart health, and weights twice weekly for bone health   Aim for 7-9 hours of sleep daily   When it comes to diets, agreement about the perfect plan isnt easy to find, even among the experts. Experts at the [facility] [employer] developed an idea known as the Healthy Eating Plate. Just imagine a plate divided into logical, healthy portions.   The emphasis is on diet quality:   Load up on vegetables and fruits - one-half of your plate: Aim for color and variety, and remember that potatoes dont count.   Go for whole grains - one-quarter of your plate: Whole wheat, barley, wheat berries, quinoa, oats, brown rice, and foods made with them. If you want pasta, go with whole wheat pasta.  Protein power - one-quarter of your plate: Fish, chicken, beans, and nuts are all healthy, versatile protein sources. Limit red meat.   The diet, however, does go beyond the plate, offering a few other suggestions.   Use healthy plant oils, such as olive, canola, soy, corn, sunflower and peanut. Check the labels, and avoid partially hydrogenated oil, which have  unhealthy trans fats.   If youre thirsty, drink water. Coffee and tea are good in moderation, but skip sugary drinks and limit milk and dairy products to one or two daily servings.   The type of carbohydrate in the diet is more important than the amount. Some sources of carbohydrates, such as vegetables, fruits, whole grains, and beans-are healthier than others.   Finally, stay active  Signed, Thomasene Ripple, DO  01/16/2021 12:35 PM    [facility]

## 2021-01-16 NOTE — Patient Instructions (Signed)
Medication Instructions:  Your physician recommends that you continue on your current medications as directed. Please refer to the Current Medication list given to you today.  *If you need a refill on your cardiac medications before your next appointment, please call your pharmacy*   Lab Work: Your physician recommends that you return for lab work: TODAY BMET, Mag  If you have labs (blood work) drawn today and your tests are completely normal, you will receive your results only by: Marland Kitchen MyChart Message (if you have MyChart) OR . A paper copy in the mail If you have any lab test that is abnormal or we need to change your treatment, we will call you to review the results.   Testing/Procedures: None   Follow-Up: At [facility], you and your health needs are our priority.  As part of our continuing mission to provide you with exceptional heart care, we have created designated Provider Care Teams.  These Care Teams include your primary Cardiologist (physician) and Advanced Practice Providers (APPs -  Physician Assistants and Nurse Practitioners) who all work together to provide you with the care you need, when you need it.  We recommend signing up for the patient portal called "MyChart".  Sign up information is provided on this After Visit Summary.  MyChart is used to connect with patients for Virtual Visits (Telemedicine).  Patients are able to view lab/test results, encounter notes, upcoming appointments, etc.  Non-urgent messages can be sent to your provider as well.   To learn more about what you can do with MyChart, go to [email or web address].    Your next appointment:   8 week(s)  The format for your next appointment:   In Person  Provider:   Thomasene Ripple, DO   Other Instructions

## 2021-01-17 LAB — MAGNESIUM: Magnesium: 2.2 mg/dL (ref 1.6–2.3)

## 2021-01-17 LAB — BASIC METABOLIC PANEL
BUN/Creatinine Ratio: 13 (ref 12–28)
BUN: 18 mg/dL (ref 8–27)
CO2: 19 mmol/L — ABNORMAL LOW (ref 20–29)
Calcium: 8.8 mg/dL (ref 8.7–10.3)
Chloride: 110 mmol/L — ABNORMAL HIGH (ref 96–106)
Creatinine, Ser: 1.38 mg/dL — ABNORMAL HIGH (ref 0.57–1.00)
GFR calc Af Amer: 45 mL/min/{1.73_m2} — ABNORMAL LOW (ref >59)
GFR calc non Af Amer: 39 mL/min/{1.73_m2} — ABNORMAL LOW (ref >59)
Glucose: 85 mg/dL (ref 65–99)
Potassium: 4.4 mmol/L (ref 3.5–5.2)
Sodium: 143 mmol/L (ref 134–144)

## 2021-01-21 ENCOUNTER — Other Ambulatory Visit: Payer: Self-pay | Admitting: Cardiology

## 2021-01-21 ENCOUNTER — Telehealth: Payer: Self-pay | Admitting: Cardiology

## 2021-01-21 NOTE — Telephone Encounter (Signed)
    Pt said Dr. Servando Salina referred her to get a home health nurse. She called [facility], home health and she was told they need some information from Dr. Servando Salina, they need last OV notes, orders and demographics fax to [phone]

## 2021-01-21 NOTE — Telephone Encounter (Signed)
Refill sent to pharmacy.   

## 2021-01-22 ENCOUNTER — Other Ambulatory Visit: Payer: Self-pay

## 2021-01-22 ENCOUNTER — Ambulatory Visit (HOSPITAL_COMMUNITY)
Admission: RE | Admit: 2021-01-22 | Discharge: 2021-01-22 | Disposition: A | Discharge status: Home / Self Care | Payer: Medicare Other | Source: Ambulatory Visit | Attending: Gastroenterology | Admitting: Gastroenterology

## 2021-01-22 DIAGNOSIS — K219 Gastro-esophageal reflux disease without esophagitis: Secondary | ICD-10-CM

## 2021-01-22 DIAGNOSIS — R1310 Dysphagia, unspecified: Secondary | ICD-10-CM | POA: Insufficient documentation

## 2021-01-22 DIAGNOSIS — R1013 Epigastric pain: Secondary | ICD-10-CM

## 2021-01-30 NOTE — Telephone Encounter (Signed)
Home Health Verbal Orders - Caller/Agency: [facility] .  Roena Malady Number: [phone] Requesting OT/PT/Skilled Nursing/Social Work/Speech Therapy: Nursing and PT  Frequency:  1 week 1 2 week 2 1 week 6  She also stated stated that when she ran her med list, pt has a drug interaction  TRINTELLIX 5 MG TABS tablet [[id number]]  diclofenac Sodium (VOLTAREN) 1 % GEL [[id number]]   Other reaction is with her :  pantoprazole (PROTONIX) 40 MG tablet [[id number] clopidogrel (PLAVIX) 75 MG tablet [[id number]

## 2021-02-01 ENCOUNTER — Other Ambulatory Visit: Payer: Self-pay | Admitting: Cardiology

## 2021-02-04 ENCOUNTER — Telehealth: Payer: Self-pay

## 2021-02-04 NOTE — Telephone Encounter (Signed)
Spoke with Barbara Cower, PT at [facility]. He states the patient has endurance related limitations. He is suggesting to work with the patient once a week for 4 weeks, to work with her balance, pacing herself and increasing her endurance. He states he will each out at the end of the 4 weeks to give an update and see if the patient needs further assistance.  Will relay information to Dr. Servando Salina.

## 2021-02-05 NOTE — Telephone Encounter (Signed)
Please have her talk to her PCP about those medication reaction.  It will be best because since I am not the one who write these prescriptions I would like them to discuss it with her.

## 2021-02-07 NOTE — Telephone Encounter (Signed)
Recommendations left of VM for pt to speak with her PCP regarding reactions.

## 2021-02-14 ENCOUNTER — Other Ambulatory Visit: Payer: Self-pay | Admitting: Hematology and Oncology

## 2021-02-14 ENCOUNTER — Inpatient Hospital Stay (INDEPENDENT_AMBULATORY_CARE_PROVIDER_SITE_OTHER): Payer: Medicare Other | Admitting: Oncology

## 2021-02-14 ENCOUNTER — Other Ambulatory Visit: Payer: Self-pay

## 2021-02-14 ENCOUNTER — Ambulatory Visit (HOSPITAL_BASED_OUTPATIENT_CLINIC_OR_DEPARTMENT_OTHER): Payer: Medicare Other | Attending: Cardiology | Admitting: Cardiovascular Disease

## 2021-02-14 ENCOUNTER — Inpatient Hospital Stay: Payer: Medicare Other | Attending: Oncology

## 2021-02-14 ENCOUNTER — Telehealth: Payer: Self-pay | Admitting: Oncology

## 2021-02-14 ENCOUNTER — Other Ambulatory Visit: Payer: Self-pay | Admitting: Oncology

## 2021-02-14 DIAGNOSIS — R0683 Snoring: Secondary | ICD-10-CM | POA: Diagnosis present

## 2021-02-14 DIAGNOSIS — D631 Anemia in chronic kidney disease: Secondary | ICD-10-CM | POA: Diagnosis present

## 2021-02-14 DIAGNOSIS — I255 Ischemic cardiomyopathy: Secondary | ICD-10-CM

## 2021-02-14 DIAGNOSIS — G4733 Obstructive sleep apnea (adult) (pediatric): Secondary | ICD-10-CM | POA: Diagnosis not present

## 2021-02-14 DIAGNOSIS — N189 Chronic kidney disease, unspecified: Secondary | ICD-10-CM

## 2021-02-14 DIAGNOSIS — G4736 Sleep related hypoventilation in conditions classified elsewhere: Secondary | ICD-10-CM | POA: Insufficient documentation

## 2021-02-14 DIAGNOSIS — R0902 Hypoxemia: Secondary | ICD-10-CM | POA: Diagnosis not present

## 2021-02-14 LAB — CBC
MCV: 86 (ref 81–99)
RBC: 4.59 (ref 3.87–5.11)

## 2021-02-14 LAB — FERRITIN: Ferritin: 19 ng/mL (ref 11–307)

## 2021-02-14 LAB — HEPATIC FUNCTION PANEL
ALT: 15 (ref 7–35)
AST: 31 (ref 13–35)
Alkaline Phosphatase: 72 (ref 25–125)
Bilirubin, Total: 0.4

## 2021-02-14 LAB — BASIC METABOLIC PANEL
BUN: 11 (ref 4–21)
CO2: 28 — AB (ref 13–22)
Chloride: 107 (ref 99–108)
Creatinine: 0.9 (ref 0.5–1.1)
Glucose: 208
Potassium: 3.9 (ref 3.4–5.3)
Sodium: 140 (ref 137–147)

## 2021-02-14 LAB — IRON AND TIBC
Iron: 162 ug/dL (ref 28–170)
Saturation Ratios: 45 % — ABNORMAL HIGH (ref 10.4–31.8)
TIBC: 363 ug/dL (ref 250–450)
UIBC: 201 ug/dL

## 2021-02-14 LAB — CBC AND DIFFERENTIAL
HCT: 39 (ref 36–46)
Hemoglobin: 12.7 (ref 12.0–16.0)
Neutrophils Absolute: 2.55
Platelets: 234 (ref 150–399)
WBC: 4.9

## 2021-02-14 LAB — COMPREHENSIVE METABOLIC PANEL
Albumin: 4.2 (ref 3.5–5.0)
Calcium: 8.7 (ref 8.7–10.7)

## 2021-02-14 NOTE — Progress Notes (Signed)
  [facility]  [address] [place],  [place]  [address] [phone]  Clinic Day:  02/14/2021  Referring physician: Selena Batten *   HISTORY OF PRESENT ILLNESS:  The patient is a 70 y.o. 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  In the past, IV Feraheme was effective in replenishing her iron stores and improving her hemoglobin.  She comes in today to reassess her anemia.  Since her last visit, the patient has been doing fairly well.  She denies having increased fatigue or any overt forms of blood loss which concern her for progressive anemia.   PHYSICAL EXAM:  Blood pressure 140/67, pulse 84, temperature 98 F (36.7 C), resp. rate 16, height 5' 3.5" (1.613 m), weight 198 lb 12.8 oz (90.2 kg), SpO2 98 %. Wt Readings from Last 3 Encounters:  02/14/21 190 lb (86.2 kg)  02/14/21 198 lb 12.8 oz (90.2 kg)  01/16/21 194 lb (88 kg)   Body mass index is 34.66 kg/m. Performance status (ECOG): 1 - Symptomatic but completely ambulatory Physical Exam  LABS:   CBC Latest Ref Rng & Units 02/14/2021 12/30/2020 12/28/2020  WBC - 4.9 7.9 6.9  Hemoglobin 12.0 - 16.0 12.7 11.3(L) 11.1(L)  Hematocrit 36 - 46 39 37.8 37.2  Platelets 150 - 399 234 313 320   CMP Latest Ref Rng & Units 02/14/2021 01/16/2021 12/30/2020  Glucose 65 - 99 mg/dL - 85 962(E)  BUN 4 - 21 11 18 14   Creatinine 0.5 - 1.1 0.9 1.38(H) 1.16(H)  Sodium 137 - 147 140 143 137  Potassium 3.4 - 5.3 3.9 4.4 3.8  Chloride 99 - 108 107 110(H) 105  CO2 13 - 22 28(A) 19(L) 25  Calcium 8.7 - 10.7 8.7 8.8 8.7(L)  Total Protein 6.5 - 8.1 g/dL - - -  Total Bilirubin 0.3 - 1.2 mg/dL - - -  Alkaline Phos 25 - 125 72 - -  AST 13 - 35 31 - -  ALT 7 - 35 15 - -    Ref. Range 02/14/2021 14:32  Iron Latest Ref Range: 28 - 170 ug/dL 02/16/2021  UIBC Latest Units: ug/dL 366  TIBC Latest Ref Range: 250 - 450 ug/dL 294  Saturation Ratios Latest Ref Range: 10.4 - 31.8 %  45 (H)  Ferritin Latest Ref Range: 11 - 307 ng/mL 19   ASSESSMENT & PLAN:  Assessment/Plan:  A 70 y.o. female with anemia secondary to iron deficiency and previous renal insufficiency.  I am pleased as her hemoglobin has returned to normal.  Her iron parameters are normal.  Her kidney parameters have also returned to normal as she has completely recuperated from her acute medical issues, including COVID.  Clinically, she is doing very well.  As that is the case, I will see her back in 6 months for repeat clinical assessment.  The patient understands all the plans discussed today and is in agreement with them.    Syana Degraffenreid 61, MD

## 2021-02-14 NOTE — Progress Notes (Unsigned)
[facility]  [address] [place],  [place]  [address] [phone]  Clinic Day:  02/14/2021  Referring physician: No ref. provider found   HISTORY OF PRESENT ILLNESS:  The patient is a 70 y.o. 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  Doc receive IV Feraheme earlier this year, which was effective in replenishing her iron stores and improving her hemoglobin.  She comes in today to reassess her anemia.  Since her last visit, the patient has run into multiple health problems, including congestive heart failure, a heart attack, and COVID infection.  She claims to have undergone a cardiac catheterization recently, for which 2 stents were placed.  Despite all of her recent health issues, the patient claims to be doing fairly well today.  She denies having increased fatigue or any overt forms of blood loss which concern her for progressive anemia. Marland Kitchen  PHYSICAL EXAM:  There were no vitals taken for this visit. Wt Readings from Last 3 Encounters:  01/16/21 194 lb (88 kg)  01/14/21 195 lb (88.5 kg)  01/01/21 189 lb 3.2 oz (85.8 kg)   There is no height or weight on file to calculate BMI. Performance status (ECOG): {CHL ONC [id number] Physical Exam  LABS:   CBC Latest Ref Rng & Units 12/30/2020 12/28/2020 12/27/2020  WBC 4.0 - 10.5 K/uL 7.9 6.9 8.3  Hemoglobin 12.0 - 15.0 g/dL 11.3(L) 11.1(L) 11.2(L)  Hematocrit 36.0 - 46.0 % 37.8 37.2 36.6  Platelets 150 - 400 K/uL 313 320 308   CMP Latest Ref Rng & Units 01/16/2021 12/30/2020 12/29/2020  Glucose 65 - 99 mg/dL 85 086(V) 784(O)  BUN 8 - 27 mg/dL 18 14 14   Creatinine 0.57 - 1.00 mg/dL ) 9.62(X) 5.28(U)  Sodium 134 - 144 mmol/L 143 137 137  Potassium 3.5 - 5.2 mmol/L 4.4 3.8 3.9  Chloride 96 - 106 mmol/L 110(H) 105 105  CO2 20 - 29 mmol/L 19(L) 25 24  Calcium 8.7 - 10.3 mg/dL 8.8 1.32(G) 9.2  Total  Protein 6.5 - 8.1 g/dL - - -  Total Bilirubin 0.3 - 1.2 mg/dL - - -  Alkaline Phos 38 - 126 U/L - - -  AST 15 - 41 U/L - - -  ALT 0 - 44 U/L - - -     No results found for: CEA1 / No results found for: CEA1 No results found for: PSA1 No results found for: 4.0(N No results found for: UUV253  No results found for: TOTALPROTELP, ALBUMINELP, A1GS, A2GS, BETS, BETA2SER, GAMS, MSPIKE, SPEI No results found for: TIBC, FERRITIN, IRONPCTSAT No results found for: LDH  No results found for: AFPTUMOR, TOTALPROTELP, ALBUMINELP, A1GS, A2GS, BETS, BETA2SER, GAMS, MSPIKE, SPEI, LDH, CEA1, PSA1, IGASERUM, IGGSERUM, IGMSERUM, THGAB, THYROGLB  Recent Review Flowsheet Data   There is no flowsheet data to display.      STUDIES:  DG UGI W DOUBLE CM (HD BA)  Result Date: 01/22/2021 CLINICAL DATA:  Chronic epigastric EXAM: UPPER GI SERIES WITH KUB TECHNIQUE: After obtaining a scout radiograph a routine upper GI series was performed using thin and high density barium. FLUOROSCOPY TIME:  Fluoroscopy Time:  2 minutes 6 seconds Radiation Exposure Index (if provided by the fluoroscopic device): 37.6 mGy Number of Acquired Spot Images: 0 COMPARISON:  None. FINDINGS: The scout radiograph shows a nonobstructive bowel gas pattern. The swallowing mechanism is unremarkable.  No aspiration noted. The esophagus is patent.  There is no stricture or mass identified. A 13 mm barium tablet was ingested which passed through the esophagus and into the stomach without difficulty. Mild esophageal dysmotility was noted with a few non propulsive tertiary waves identified. Initially, the stomach was slow to empty. The appearance of the stomach, duodenal bulb and duodenal c loop is normal. No hiatal hernia or reflux identified. IMPRESSION: 1. Mild esophageal dysmotility. 2. No explanation for patient's chronic epigastric pain. Electronically Signed   By: Signa Kell M.D.   On: 01/22/2021 12:49   CUP PACEART INCLINIC DEVICE  CHECK  Result Date: 01/15/2021 Wound check appointment. Steri-strips removed. Wound without redness or edema. Incision edges approximated, wound well healed.   Device check completed with industry.   Normal device function.  Thresholds, sensing, and impedances consistent with implant measurements. Device programmed for LV only pacing, RV currently set subthreshold.   RA and LV programmed at 3.5V for extra safety margin until 3 month visit. Histogram distribution appropriate for patient and level of activity. No mode switches or ventricular arrhythmias noted. Patient educated about wound care, arm mobility, lifting restrictions, shock plan. Patient is enrolled in remote monitoring, new monitor provided by industry rep today.  Next scheduled check 04/05/21.  ROV with Dr. Ladona Ridgel on 04/03/21.Judy Pimple, BSN, RN     ASSESSMENT & PLAN:   Assessment/Plan:  A 70 y.o. female with *** .The patient understands all the plans discussed today and is in agreement with them.      Milanya Sunderland Kirby Funk, MD

## 2021-02-14 NOTE — Telephone Encounter (Signed)
02/14/21 Per los next appt scheduled and given to patient

## 2021-02-19 ENCOUNTER — Telehealth: Payer: Self-pay

## 2021-02-19 DIAGNOSIS — D631 Anemia in chronic kidney disease: Secondary | ICD-10-CM | POA: Insufficient documentation

## 2021-02-19 DIAGNOSIS — N189 Chronic kidney disease, unspecified: Secondary | ICD-10-CM | POA: Insufficient documentation

## 2021-02-19 NOTE — Telephone Encounter (Signed)
The patient asked do she needs to take her monitor to [place] with her? I told her if she is going to be gone for more than 7 days, then she needs to take the monitor with her. The patient verbalized understanding.

## 2021-02-19 NOTE — Telephone Encounter (Signed)
Scheduled with Leah at [facility]..no PA required 02/12/21:  Study completed.

## 2021-02-25 ENCOUNTER — Ambulatory Visit: Payer: Medicare Other | Admitting: Gastroenterology

## 2021-02-25 NOTE — Progress Notes (Deleted)
Karen Warner    [id number]    [birth year]  Primary Care Physician:Kruppenbach, Metro Kung, FNP  Referring Physician: Laurel Dimmer, FNP [address] [name],  [place] [address]   Chief complaint:  ***  HPI:  70 year old very pleasant female with history of COPD, hypertension, hyperlipidemia, diabetes, CAD, ischemic cardiomyopathy with severe CHF s/p biventricular ICD in January 2022  Echocardiogram December 27, 2020: LVEF 20 to 25%, left ventricle has severely decreased function.  Upper GI series 01/22/21 The scout radiograph shows a nonobstructive bowel gas pattern. The swallowing mechanism is unremarkable.  No aspiration noted. The esophagus is patent. There is no stricture or mass identified. A 13 mm barium tablet was ingested which passed through the esophagus and into the stomach without difficulty. Mild esophageal dysmotility was noted with a few non propulsive tertiary waves identified. Initially, the stomach was slow to empty. The appearance of the stomach, duodenal bulb and duodenal c loop is normal. No hiatal hernia or reflux identified.  She has history of chronic GERD, was previously taking Nexium, stopped due to drug interaction with Plavix.  She was taking famotidine daily with persistent heartburn and was recently started on pantoprazole daily.  She has only taken it for past 2 days. Complains of intermittent dysphagia mostly with pills and meat.  She has epigastric abdominal discomfort, nausea, regurgitation worse after meals.  She had EGD, capsule endoscopy and colonoscopy in [place] by Dr. Jennye Boroughs in 2019 for evaluation of anemia. colonoscopy October 15, 2018 showed evidence of left hemicolectomy with anastomosis 2 cm from the anal verge.  Diverticulosis. EGD October 25, 2018 showed gastritis, hiatal hernia.  Gastric biopsies negative for H. Pylori Small bowel video capsule October 07, 2018: AVM of the proximal  jejunum, nonbleeding.  Colonoscopy October 07, 2019: 3 diminutive tubular adenomas removed, few scattered diverticula proximal to the anastomosis, left hemicolectomy for diverticular disease.  She has been receiving IV iron and also takes oral iron.  Baseline hemoglobin around 10. Denies any rectal bleeding or melena.   Outpatient Encounter Medications as of 02/25/2021  Medication Sig  . acetaminophen (TYLENOL) 325 MG tablet Take 2 tablets by mouth every 6 (six) hours as needed.  . ACETAMINOPHEN-BUTALBITAL 50-325 MG TABS Take 1 tablet by mouth every 4 (four) hours as needed.  Marland Kitchen albuterol (PROVENTIL) (2.5 MG/3ML) 0.083% nebulizer solution INL CONTENTS OF 1 VIAL VIA NEBULIZER Q 4 H PRF WHEEZING OR COUGH  . albuterol (VENTOLIN HFA) 108 (90 Base) MCG/ACT inhaler Inhale 2 puffs into the lungs every 6 (six) hours as needed.  Marland Kitchen aspirin 81 MG EC tablet 81 mg daily.  . baclofen (LIORESAL) 10 MG tablet Take 10 mg by mouth 2 (two) times daily.  . budesonide (PULMICORT) 1 MG/2ML nebulizer solution Take 1 mg by nebulization 2 (two) times daily as needed (shortness of breath).  . carvedilol (COREG) 3.125 MG tablet TAKE 1 TABLET(3.125 MG) BY MOUTH TWICE DAILY WITH A MEAL  . clopidogrel (PLAVIX) 75 MG tablet Take 1 tablet (75 mg total) by mouth daily.  . COMBIVENT RESPIMAT 20-100 MCG/ACT AERS respimat Inhale 1 puff into the lungs 2 (two) times daily.  . dapagliflozin propanediol (FARXIGA) 10 MG TABS tablet Take 1 tablet (10 mg total) by mouth daily before breakfast.  . diclofenac Sodium (VOLTAREN) 1 % GEL Apply 2 g topically 2 (two) times daily as needed.  . dicyclomine (BENTYL) 10 MG capsule Take 10 mg by mouth  every 6 (six) hours as needed for spasms.  Marland Kitchen ENTRESTO 24-26 MG TAKE 1 TABLET BY MOUTH TWICE DAILY  . famotidine (PEPCID) 40 MG tablet Take 1 tablet (40 mg total) by mouth at bedtime.  . ferrous sulfate 324 MG TBEC Take 324 mg by mouth daily with breakfast.  . fluticasone (FLONASE) 50 MCG/ACT nasal  spray Place into both nostrils.  . insulin lispro (HUMALOG) 100 UNIT/ML KwikPen Inject 16 Units into the skin 3 (three) times daily. Administer 16 units three times daily before a meal per sliding scale  . latanoprost (XALATAN) 0.005 % ophthalmic solution   . loratadine (CLARITIN) 10 MG tablet Take 10 mg by mouth daily.  . magnesium oxide (MAG-OX) 400 MG tablet Take 400 mg by mouth daily.  . nitroGLYCERIN (NITROSTAT) 0.4 MG SL tablet Place 1 tablet (0.4 mg total) under the tongue every 5 (five) minutes as needed for chest pain.  Marland Kitchen nystatin (MYCOSTATIN) 100000 UNIT/ML suspension Take 5 mLs by mouth 4 (four) times daily.  . pantoprazole (PROTONIX) 40 MG tablet Take 1 tablet (40 mg total) by mouth daily.  . potassium chloride (KLOR-CON) 10 MEQ tablet Take 10 mEq by mouth daily.  . pregabalin (LYRICA) 50 MG capsule Take 50 mg by mouth 3 (three) times daily.  . rosuvastatin (CRESTOR) 40 MG tablet Take 40 mg by mouth daily.  . SEREVENT DISKUS 50 MCG/DOSE diskus inhaler Inhale 1 puff into the lungs 2 (two) times daily.  Evaristo Bury FLEXTOUCH 200 UNIT/ML FlexTouch Pen Inject 40 Units into the skin 2 (two) times daily.  . TRINTELLIX 5 MG TABS tablet Take 5 mg by mouth at bedtime.  . valACYclovir (VALTREX) 1000 MG tablet Take 1,000 mg by mouth 2 (two) times daily as needed (flares).  . vitamin B-12 (CYANOCOBALAMIN) 1000 MCG tablet Take 1,000 mcg by mouth daily.  . Vitamin D, Ergocalciferol, (DRISDOL) 1.25 MG (50000 UNIT) CAPS capsule Take 50,000 Units by mouth once a week. sundays  . WAL-DRYL ALLERGY 12.5 MG/5ML liquid Take 12.5 mg by mouth daily as needed for allergies or itching.   No facility-administered encounter medications on file as of 02/25/2021.    Allergies as of 02/25/2021 - Review Complete 01/16/2021  Allergen Reaction Noted  . Bee venom Anaphylaxis 02/01/2018  . Sumatriptan Other (See Comments) and Shortness Of Breath 11/02/2020  . Tramadol Hives 05/06/2019  . Amoxicillin-pot clavulanate  Diarrhea, Nausea And Vomiting, and Other (See Comments) 11/08/2014  . Oxycodone Itching and Hives 11/08/2014  . Buprenorphine hcl  04/19/2012  . Clarithromycin  04/19/2012  . Duloxetine hcl  04/19/2012  . Hydrocodone  04/19/2012  . Lactose  05/26/2018  . Liraglutide  04/19/2012  . Tramadol hcl  04/19/2012    Past Medical History:  Diagnosis Date  . Anemia 12/12/2019  . Arthralgia 01/14/2020  . Arthritis   . Asthma   . Atherosclerotic heart disease of native coronary artery without angina pectoris 04/13/2014  . CHF (congestive heart failure) (HCC)   . Chronic bilateral low back pain with bilateral sciatica 01/13/2019  . Chronic bladder pain 11/08/2014   Last Assessment & Plan:  Formatting of this note might be different from the original. Patient has chronic pain syndrome in general I think this is unfortunately worsening her pelvic pain and bladder pain her examination today was very reassuring I am advised her to use the estrogen cream I did start her on Elavil 10 milligrams at that time if she does tolerated will increase it to 25 milligrams.   Marland Kitchen  Chronic headaches   . Chronic idiopathic constipation 12/22/2014   Formatting of this note might be different from the original. Last Assessment & Plan:  For better bowel emptying please use either Citrucel / Benefiber start with 2 tablespoons daily and titrate up or down to effect.  Also please use glycerin suppositories as needed to assist in evacuation. Last Assessment & Plan:  Formatting of this note might be different from the original. For better bowel empt  . Chronic obstructive pulmonary disease, unspecified (HCC) 03/18/2018  . Class 1 obesity due to excess calories with serious comorbidity and body mass index (BMI) of 31.0 to 31.9 in adult 06/26/2020  . Colon polyps   . COPD (chronic obstructive pulmonary disease) (HCC)   . Coronary artery disease   . DDD (degenerative disc disease), cervical 01/13/2019  . Depression 11/05/2019  . Diabetic  polyneuropathy associated with type 2 diabetes mellitus (HCC) 02/24/2019  . Diverticulitis 05/20/2018  . Diverticulitis of colon 03/18/2018  . Family history of ischemic heart disease (IHD) 08/16/2013  . Fibromyalgia affecting multiple sites 01/14/2020  . Gastro-esophageal reflux disease without esophagitis 01/13/2019  . Glaucoma 11/08/2014  . Hordeolum externum of left upper eyelid 03/13/2020  . Hypertension 11/08/2014  . IBS (irritable bowel syndrome)   . Iron deficiency anemia 01/13/2019  . Left renal mass 11/08/2019  . Leukocytosis 05/28/2018  . Low vitamin B12 level 03/13/2020  . Low vitamin D level 12/12/2019  . LV dysfunction 05/31/2019  . Malignant essential hypertension 01/22/2016  . Migraine, unspecified, not intractable, without status migrainosus 11/08/2014  . Mixed hyperlipidemia 01/13/2019  . Myocardial infarction (HCC)   . Other premature beats 11/08/2014  . 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 PVD (peripheral vascular disease) (HCC) 04/08/2017  . 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 Trochanteric bursitis of right hip 04/20/2019  . Type 2 diabetes mellitus without complications (HCC) 12/08/2013  . Vaginal atrophy 11/08/2014   Formatting of this note might be different from the original. Last Assessment & Plan:  For vaginal atrophy please place a pea size dab of Estrogen vaginal cream  into the vagina 3 times a week ( Monday, Wednesday, Friday) Last Assessment & Plan:  Formatting of this note might be different from the original. For vaginal atrophy please place a pea size dab of Estrogen vaginal cream  into the vagina     Past Surgical History:  Procedure Laterality Date  . ABDOMINAL HYSTERECTOMY    . BIV ICD INSERTION CRT-D N/A 12/31/2020   Procedure: BIV ICD INSERTION CRT-D;  Surgeon: Marinus Maw, MD;  Location[facility] INVASIVE CV LAB;  Service: Cardiovascular;  Laterality: N/A;  . BLEPHAROPLASTY Bilateral   . COLON SURGERY    . CORONARY  ANGIOPLASTY WITH STENT PLACEMENT  2020   X3  . CORONARY STENT INTERVENTION N/A 08/03/2020   Procedure: CORONARY STENT INTERVENTION;  Surgeon: Tonny Bollman, MD;  Location[facility] INVASIVE CV LAB;  Service: Cardiovascular;  Laterality: N/A;  . INTRAVASCULAR PRESSURE WIRE/FFR STUDY N/A 08/03/2020   Procedure: INTRAVASCULAR PRESSURE WIRE/FFR STUDY;  Surgeon: Tonny Bollman, MD;  Location: [facility] INVASIVE CV LAB;  Service: Cardiovascular;  Laterality: N/A;  . LEFT HEART CATH AND CORONARY ANGIOGRAPHY N/A 08/03/2020   Procedure: LEFT HEART CATH AND CORONARY ANGIOGRAPHY;  Surgeon: Tonny Bollman, MD;  Location: [facility] INVASIVE CV LAB;  Service: Cardiovascular;  Laterality: N/A;  . REFRACTIVE SURGERY Left    [place]    Family History  Problem Relation Age of  Onset  . Lung cancer Mother   . Coronary artery disease Mother   . Diabetes Mother   . Glaucoma Mother   . Hypertension Mother   . Colon polyps Mother   . Migraines Father   . Heart disease Father   . Diabetes Maternal Grandmother   . Diabetes Daughter   . Migraines Son   . Hypertension Brother     Social History   Socioeconomic History  . Marital status: Married    Spouse name: Not on file  . Number of children: 3  . Years of education: Not on file  . Highest education level: Not on file  Occupational History  . Occupation: retired  Tobacco Use  . Smoking status: Former Smoker    Types: Cigarettes    Quit date: 2021    Years since quitting: 1.2  . Smokeless tobacco: Never Used  Vaping Use  . Vaping Use: Never used  Substance and Sexual Activity  . Alcohol use: Never  . Drug use: Never  . Sexual activity: Not on file  Other Topics Concern  . Not on file  Social History Narrative  . Not on file   Social Determinants of Health   Financial Resource Strain: Not on file  Food Insecurity: Not on file  Transportation Needs: Not on file  Physical Activity: Not on file  Stress: Not on file  Social Connections: Not on file   Intimate Partner Violence: Not on file      Review of systems: All other review of systems negative except as mentioned in the HPI.   Physical Exam: There were no vitals filed for this visit. There is no height or weight on file to calculate BMI. Gen:      No acute distress HEENT:  sclera anicteric Abd:      soft, non-tender; no palpable masses, no distension Ext:    No edema Neuro: alert and oriented x 3 Psych: normal mood and affect  Data Reviewed:  Reviewed labs, radiology imaging, old records and pertinent past GI work up   Assessment and Plan/Recommendations:  ***  This visit required *** minutes of patient care (this includes precharting, chart review, review of results, face-to-face time used for counseling as well as treatment plan and follow-up. The patient was provided an opportunity to ask questions and all were answered. The patient agreed with the plan and demonstrated an understanding of the instructions.  Iona Beard , MD    CC: Selena Batten *

## 2021-02-28 ENCOUNTER — Encounter (HOSPITAL_BASED_OUTPATIENT_CLINIC_OR_DEPARTMENT_OTHER): Payer: Self-pay | Admitting: Cardiovascular Disease

## 2021-02-28 NOTE — Procedures (Signed)
Patient Name: Karen Warner, Filler Date: 02/14/2021 Gender: Female D.O.B: March 14, 1951 Age (years): 84 Referring Provider: Lavona Mound Tobb DO Height (inches): 65 Interpreting Physician: Nicki Guadalajara MD, ABSM Weight (lbs): 190 RPSGT: Cherylann Parr BMI: 32 MRN: [id number] Neck Size: 16.00  CLINICAL INFORMATION Sleep Study Type: NPSG  Indication for sleep study: Snoring, Witnesses Apnea / Gasping During Sleep  Epworth Sleepiness Score: 16  SLEEP STUDY TECHNIQUE As per the AASM Manual for the Scoring of Sleep and Associated Events v2.3 (April 2016) with a hypopnea requiring 4% desaturations.  The channels recorded and monitored were frontal, central and occipital EEG, electrooculogram (EOG), submentalis EMG (chin), nasal and oral airflow, thoracic and abdominal wall motion, anterior tibialis EMG, snore microphone, electrocardiogram, and pulse oximetry.  MEDICATIONS acetaminophen (TYLENOL) 325 MG tablet  ACETAMINOPHEN-BUTALBITAL 50-325 MG TABS  albuterol (PROVENTIL) (2.5 MG/3ML) 0.083% nebulizer solution  albuterol (VENTOLIN HFA) 108 (90 Base) MCG/ACT inhaler  aspirin 81 MG EC tablet  baclofen (LIORESAL) 10 MG tablet  budesonide (PULMICORT) 1 MG/2ML nebulizer solution  carvedilol (COREG) 3.125 MG tablet  clopidogrel (PLAVIX) 75 MG tablet  COMBIVENT RESPIMAT 20-100 MCG/ACT AERS respimat  dapagliflozin propanediol (FARXIGA) 10 MG TABS tablet  diclofenac Sodium (VOLTAREN) 1 % GEL  dicyclomine (BENTYL) 10 MG capsule  ENTRESTO 24-26 MG  famotidine (PEPCID) 40 MG tablet  ferrous sulfate 324 MG TBEC  fluticasone (FLONASE) 50 MCG/ACT nasal spray  insulin lispro (HUMALOG) 100 UNIT/ML KwikPen  latanoprost (XALATAN) 0.005 % ophthalmic solution  loratadine (CLARITIN) 10 MG tablet  magnesium oxide (MAG-OX) 400 MG tablet  nitroGLYCERIN (NITROSTAT) 0.4 MG SL tablet  nystatin (MYCOSTATIN) 100000 UNIT/ML suspension  pantoprazole (PROTONIX) 40 MG tablet  potassium chloride  (KLOR-CON) 10 MEQ tablet  pregabalin (LYRICA) 50 MG capsule  rosuvastatin (CRESTOR) 40 MG tablet  SEREVENT DISKUS 50 MCG/DOSE diskus inhaler  TRESIBA FLEXTOUCH 200 UNIT/ML FlexTouch Pen  TRINTELLIX 5 MG TABS tablet  valACYclovir (VALTREX) 1000 MG tablet  vitamin B-12 (CYANOCOBALAMIN) 1000 MCG tablet  Vitamin D, Ergocalciferol, (DRISDOL) 1.25 MG (50000 UNIT) CAPS capsule  WAL-DRYL ALLERGY 12.5 MG/5ML liquid  Medications self-administered by patient taken the night of the study : N/A  SLEEP ARCHITECTURE The study was initiated at 10:17:36 PM and ended at 5:00:47 AM.  Sleep onset time was 9.0 minutes and the sleep efficiency was 95.0%%. The total sleep time was 383.2 minutes.  Stage REM latency was 106.0 minutes.  The patient spent 2.1%% of the night in stage N1 sleep, 85.0%% in stage N2 sleep, 0.0%% in stage N3 and 12.9% in REM.  Alpha intrusion was absent.  Supine sleep was 59.47%.  RESPIRATORY PARAMETERS The overall apnea/hypopnea index (AHI) was 17.1 per hour. The respiratory disturbance index (RDI) was 17.4/h. There were 2 total apneas, including 2 obstructive, 0 central and 0 mixed apneas. There were 107 hypopneas and 2 RERAs.  The AHI during Stage REM sleep was 14.5 per hour.  AHI while supine was 12.9 per hour.  The mean oxygen saturation was 93.9%. The minimum SpO2 during sleep was 85.0%.  Moderate snoring was noted during this study.  CARDIAC DATA The 2 lead EKG demonstrated sinus rhythm, pacemaker generated. The mean heart rate was 90.0 beats per minute. Other EKG findings include: PVCs.  LEG MOVEMENT DATA The total PLMS were 0 with a resulting PLMS index of 0.0. Associated arousal with leg movement index was 0.5 .  IMPRESSIONS - Moderate obstructive sleep apnea occurred during this study (AHI 17.1/h; RDI 17.4/h). - Mild oxygen desaturation to a  nadir of 85%. - The patient snored with moderate snoring volume. - EKG findings include paced rhythm, PVCs. -  Clinically significant periodic limb movements did not occur during sleep. No significant associated arousals.  DIAGNOSIS - Obstructive Sleep Apnea (G47.33) - Nocturnal Hypoxemia (G47.36)  RECOMMENDATIONS - In this patient with significant cardiovascular co-morbidities recommend an in-lab therapeutic CPAP titration to determine optimal pressure required to alleviate sleep disordered breathing. - Effort should be made to optimize nasal and oropharyngeal patency.  - Avoid alcohol, sedatives and other CNS depressants that may worsen sleep apnea and disrupt normal sleep architecture. - Sleep hygiene should be reviewed to assess factors that may improve sleep quality. - Weight management (BMI 32) and regular exercise should be initiated or continued if appropriate.  [Electronically signed] 02/28/2021 11:05 AM  Nicki Guadalajara MD, [facility], ABSM Diplomate, American Board of Sleep Medicine   NPI: [id number] [facility] PH: [phone]   FX: [phone] ACCREDITED BY THE AMERICAN ACADEMY OF SLEEP MEDICINE

## 2021-03-01 ENCOUNTER — Other Ambulatory Visit: Payer: Self-pay | Admitting: Cardiology

## 2021-03-05 ENCOUNTER — Telehealth: Payer: Self-pay | Admitting: *Deleted

## 2021-03-05 ENCOUNTER — Other Ambulatory Visit: Payer: Self-pay | Admitting: Cardiovascular Disease

## 2021-03-05 DIAGNOSIS — G4733 Obstructive sleep apnea (adult) (pediatric): Secondary | ICD-10-CM

## 2021-03-05 NOTE — Telephone Encounter (Signed)
Patient notified of sleep study results and recommendations. She agrees to proceed with titration study. CPAP titration has been scheduled for June 2

## 2021-03-14 DIAGNOSIS — J449 Chronic obstructive pulmonary disease, unspecified: Secondary | ICD-10-CM | POA: Insufficient documentation

## 2021-03-14 DIAGNOSIS — R519 Headache, unspecified: Secondary | ICD-10-CM | POA: Insufficient documentation

## 2021-03-14 DIAGNOSIS — M1990 Unspecified osteoarthritis, unspecified site: Secondary | ICD-10-CM | POA: Insufficient documentation

## 2021-03-14 DIAGNOSIS — G8929 Other chronic pain: Secondary | ICD-10-CM | POA: Insufficient documentation

## 2021-03-14 DIAGNOSIS — I509 Heart failure, unspecified: Secondary | ICD-10-CM | POA: Insufficient documentation

## 2021-03-14 DIAGNOSIS — K635 Polyp of colon: Secondary | ICD-10-CM | POA: Insufficient documentation

## 2021-03-14 DIAGNOSIS — J45909 Unspecified asthma, uncomplicated: Secondary | ICD-10-CM | POA: Insufficient documentation

## 2021-03-15 ENCOUNTER — Other Ambulatory Visit: Payer: Self-pay

## 2021-03-15 ENCOUNTER — Ambulatory Visit (INDEPENDENT_AMBULATORY_CARE_PROVIDER_SITE_OTHER): Payer: Medicare Other | Admitting: Cardiology

## 2021-03-15 ENCOUNTER — Encounter: Payer: Self-pay | Admitting: Cardiology

## 2021-03-15 VITALS — BP 110/72 | HR 80 | Ht 65.0 in | Wt 194.8 lb

## 2021-03-15 DIAGNOSIS — Z720 Tobacco use: Secondary | ICD-10-CM

## 2021-03-15 DIAGNOSIS — I519 Heart disease, unspecified: Secondary | ICD-10-CM

## 2021-03-15 DIAGNOSIS — E1142 Type 2 diabetes mellitus with diabetic polyneuropathy: Secondary | ICD-10-CM

## 2021-03-15 DIAGNOSIS — G4733 Obstructive sleep apnea (adult) (pediatric): Secondary | ICD-10-CM

## 2021-03-15 DIAGNOSIS — I2510 Atherosclerotic heart disease of native coronary artery without angina pectoris: Secondary | ICD-10-CM | POA: Diagnosis not present

## 2021-03-15 DIAGNOSIS — I10 Essential (primary) hypertension: Secondary | ICD-10-CM | POA: Diagnosis not present

## 2021-03-15 MED ORDER — CLOPIDOGREL BISULFATE 75 MG PO TABS: 75.0000 mg | ORAL_TABLET | Freq: Every day | ORAL | 3 refills | Status: DC

## 2021-03-15 NOTE — Progress Notes (Signed)
Cardiology Office Note:    Date:  03/15/2021   ID:  Karen Warner, DOB 29-Jul-1951, MRN [id number]  PCP:  Laurel Dimmer, FNP  Cardiologist:  Thomasene Ripple, DO  Electrophysiologist:  None   Referring MD: Selena Batten *   I am doing better  History of Present Illness:    Karen Warner is a 70 y.o. female with a hx of of coronary artery disease status post PCI, ischemic cardiomyopathy status post BiV pacemaker during her hospitalization in January 2022 presented for syncope episode, heart failure reduced ejection fraction recent EF 20 to 25% is here today for follow-up visit.  I saw the patient most recently on January 16, 2021 at that time she had had a fall and was concerned about information on her device.  We reviewed her device check which was normal.  She did have some bilateral leg edema but was still at her dry weight.  Therefore her diuretics was not changed.   Since I saw the patient she has had her sleep study which showed moderate sleep apnea, her CPAP titration study is set for May 02, 2021.  Past Medical History:  Diagnosis Date  . Anemia 12/12/2019  . Arthralgia 01/14/2020  . Arthritis   . Asthma   . Atherosclerotic heart disease of native coronary artery without angina pectoris 04/13/2014  . CHF (congestive heart failure) (HCC)   . Chronic bilateral low back pain with bilateral sciatica 01/13/2019  . Chronic bladder pain 11/08/2014   Last Assessment & Plan:  Formatting of this note might be different from the original. Patient has chronic pain syndrome in general I think this is unfortunately worsening her pelvic pain and bladder pain her examination today was very reassuring I am advised her to use the estrogen cream I did start her on Elavil 10 milligrams at that time if she does tolerated will increase it to 25 milligrams.   . Chronic headaches   . Chronic idiopathic constipation 12/22/2014   Formatting of this note might be  different from the original. Last Assessment & Plan:  For better bowel emptying please use either Citrucel / Benefiber start with 2 tablespoons daily and titrate up or down to effect.  Also please use glycerin suppositories as needed to assist in evacuation. Last Assessment & Plan:  Formatting of this note might be different from the original. For better bowel empt  . Chronic obstructive pulmonary disease, unspecified (HCC) 03/18/2018  . Class 1 obesity due to excess calories with serious comorbidity and body mass index (BMI) of 31.0 to 31.9 in adult 06/26/2020  . Colon polyps   . COPD (chronic obstructive pulmonary disease) (HCC)   . Coronary artery disease   . DDD (degenerative disc disease), cervical 01/13/2019  . Depression 11/05/2019  . Diabetic polyneuropathy associated with type 2 diabetes mellitus (HCC) 02/24/2019  . Diverticulitis 05/20/2018  . Diverticulitis of colon 03/18/2018  . Family history of ischemic heart disease (IHD) 08/16/2013  . Fibromyalgia affecting multiple sites 01/14/2020  . Gastro-esophageal reflux disease without esophagitis 01/13/2019  . Glaucoma 11/08/2014  . Hordeolum externum of left upper eyelid 03/13/2020  . Hypertension 11/08/2014  . IBS (irritable bowel syndrome)   . Iron deficiency anemia 01/13/2019  . Left renal mass 11/08/2019  . Leukocytosis 05/28/2018  . Low vitamin B12 level 03/13/2020  . Low vitamin D level 12/12/2019  . LV dysfunction 05/31/2019  . Malignant essential hypertension 01/22/2016  . Migraine, unspecified, not intractable, without status migrainosus 11/08/2014  .  Mixed hyperlipidemia 01/13/2019  . Myocardial infarction (HCC)   . Other premature beats 11/08/2014  . 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 PVD (peripheral vascular disease) (HCC) 04/08/2017  . 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 Trochanteric bursitis of right hip 04/20/2019  . Type 2 diabetes mellitus without complications (HCC) 12/08/2013  . 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 Last Assessment & Plan:  For vaginal atrophy please place a pea size dab of Estrogen vaginal cream  into the vagina 3 times a week ( Monday, Wednesday, Friday) Last Assessment & Plan:  Formatting of this note might be different from the original. For vaginal atrophy please place a pea size dab of Estrogen vaginal cream  into the vagina     Past Surgical History:  Procedure Laterality Date  . ABDOMINAL HYSTERECTOMY    . BIV ICD INSERTION CRT-D N/A 12/31/2020   Procedure: BIV ICD INSERTION CRT-D;  Surgeon: Marinus Maw, MD;  Location: [facility] INVASIVE CV LAB;  Service: Cardiovascular;  Laterality: N/A;  . BLEPHAROPLASTY Bilateral   . COLON SURGERY    . CORONARY ANGIOPLASTY WITH STENT PLACEMENT  2020   X3  . CORONARY STENT INTERVENTION N/A 08/03/2020   Procedure: CORONARY STENT INTERVENTION;  Surgeon: Tonny Bollman, MD;  Location: [facility] INVASIVE CV LAB;  Service: Cardiovascular;  Laterality: N/A;  . INTRAVASCULAR PRESSURE WIRE/FFR STUDY N/A 08/03/2020   Procedure: INTRAVASCULAR PRESSURE WIRE/FFR STUDY;  Surgeon: Tonny Bollman, MD;  Location: [facility] INVASIVE CV LAB;  Service: Cardiovascular;  Laterality: N/A;  . LEFT HEART CATH AND CORONARY ANGIOGRAPHY N/A 08/03/2020   Procedure: LEFT HEART CATH AND CORONARY ANGIOGRAPHY;  Surgeon: Tonny Bollman, MD;  Location: [facility] INVASIVE CV LAB;  Service: Cardiovascular;  Laterality: N/A;  . REFRACTIVE SURGERY Left    [place]    Current Medications: Current Meds  Medication Sig  . acetaminophen (TYLENOL) 325 MG tablet Take 2 tablets by mouth every 6 (six) hours as needed for pain.  . ACETAMINOPHEN-BUTALBITAL 50-325 MG TABS Take 1 tablet by mouth every 4 (four) hours as needed for severe pain.  Marland Kitchen albuterol (PROVENTIL) (2.5 MG/3ML) 0.083% nebulizer solution INL CONTENTS OF 1 VIAL VIA NEBULIZER Q 4 H PRF WHEEZING OR COUGH  . albuterol (VENTOLIN HFA) 108 (90 Base) MCG/ACT inhaler Inhale  2 puffs into the lungs every 6 (six) hours as needed for shortness of breath or wheezing.  Marland Kitchen aspirin 81 MG EC tablet 81 mg daily.  . baclofen (LIORESAL) 10 MG tablet Take 10 mg by mouth 2 (two) times daily.  . budesonide (PULMICORT) 1 MG/2ML nebulizer solution Take 1 mg by nebulization 2 (two) times daily as needed (shortness of breath).  . carvedilol (COREG) 3.125 MG tablet TAKE 1 TABLET(3.125 MG) BY MOUTH TWICE DAILY WITH A MEAL  . COMBIVENT RESPIMAT 20-100 MCG/ACT AERS respimat Inhale 1 puff into the lungs 2 (two) times daily.  . dapagliflozin propanediol (FARXIGA) 10 MG TABS tablet Take 1 tablet (10 mg total) by mouth daily before breakfast.  . diclofenac Sodium (VOLTAREN) 1 % GEL Apply 2 g topically 2 (two) times daily as needed.  . dicyclomine (BENTYL) 10 MG capsule Take 10 mg by mouth every 6 (six) hours as needed for spasms.  Marland Kitchen ENTRESTO 24-26 MG TAKE 1 TABLET BY MOUTH TWICE DAILY  . famotidine (PEPCID) 40 MG tablet Take 1 tablet (40 mg total) by mouth at bedtime.  . ferrous sulfate 324 MG  TBEC Take 324 mg by mouth daily with breakfast.  . fluticasone (FLONASE) 50 MCG/ACT nasal spray Place into both nostrils.  . insulin lispro (HUMALOG) 100 UNIT/ML KwikPen Inject 16 Units into the skin 3 (three) times daily. Administer 16 units three times daily before a meal per sliding scale  . loratadine (CLARITIN) 10 MG tablet Take 10 mg by mouth daily.  . magnesium oxide (MAG-OX) 400 MG tablet Take 400 mg by mouth daily.  . nitroGLYCERIN (NITROSTAT) 0.4 MG SL tablet Place 1 tablet (0.4 mg total) under the tongue every 5 (five) minutes as needed for chest pain.  Marland Kitchen nystatin (MYCOSTATIN) 100000 UNIT/ML suspension Take 5 mLs by mouth 4 (four) times daily.  . pantoprazole (PROTONIX) 40 MG tablet Take 1 tablet (40 mg total) by mouth daily.  . potassium chloride (KLOR-CON) 10 MEQ tablet Take 10 mEq by mouth daily.  . pregabalin (LYRICA) 50 MG capsule Take 50 mg by mouth 3 (three) times daily.  .  rosuvastatin (CRESTOR) 40 MG tablet Take 40 mg by mouth daily.  . SEREVENT DISKUS 50 MCG/DOSE diskus inhaler Inhale 1 puff into the lungs 2 (two) times daily.  Evaristo Bury FLEXTOUCH 200 UNIT/ML FlexTouch Pen Inject 40 Units into the skin 2 (two) times daily.  . TRINTELLIX 5 MG TABS tablet Take 5 mg by mouth at bedtime.  . valACYclovir (VALTREX) 1000 MG tablet Take 1,000 mg by mouth 2 (two) times daily as needed (flares).  . vitamin B-12 (CYANOCOBALAMIN) 1000 MCG tablet Take 1,000 mcg by mouth daily.  . Vitamin D, Ergocalciferol, (DRISDOL) 1.25 MG (50000 UNIT) CAPS capsule Take 50,000 Units by mouth once a week. sundays  . WAL-DRYL ALLERGY 12.5 MG/5ML liquid Take 12.5 mg by mouth daily as needed for allergies or itching.  . [DISCONTINUED] clopidogrel (PLAVIX) 75 MG tablet Take 1 tablet (75 mg total) by mouth daily.  . [DISCONTINUED] latanoprost (XALATAN) 0.005 % ophthalmic solution Place 1 drop into both eyes daily.     Allergies:   Bee venom, Sumatriptan, Tramadol, Amoxicillin-pot clavulanate, Oxycodone, Buprenorphine hcl, Clarithromycin, Duloxetine hcl, Hydrocodone, Lactose, Liraglutide, and Tramadol hcl   Social History   Socioeconomic History  . Marital status: Married    Spouse name: Not on file  . Number of children: 3  . Years of education: Not on file  . Highest education level: Not on file  Occupational History  . Occupation: retired  Tobacco Use  . Smoking status: Former Smoker    Types: Cigarettes    Quit date: 2021    Years since quitting: 1.2  . Smokeless tobacco: Never Used  Vaping Use  . Vaping Use: Never used  Substance and Sexual Activity  . Alcohol use: Never  . Drug use: Never  . Sexual activity: Not on file  Other Topics Concern  . Not on file  Social History Narrative  . Not on file   Social Determinants of Health   Financial Resource Strain: Not on file  Food Insecurity: Not on file  Transportation Needs: Not on file  Physical Activity: Not on file   Stress: Not on file  Social Connections: Not on file     Family History: The patient's family history includes Colon polyps in her mother; Coronary artery disease in her mother; Diabetes in her daughter, maternal grandmother, and mother; Glaucoma in her mother; Heart disease in her father; Hypertension in her brother and mother; Lung cancer in her mother; Migraines in her father and son.  ROS:   Review of Systems  Constitution: Negative for decreased appetite, fever and weight gain.  HENT: Negative for congestion, ear discharge, hoarse voice and sore throat.   Eyes: Negative for discharge, redness, vision loss in right eye and visual halos.  Cardiovascular: Negative for chest pain, dyspnea on exertion, leg swelling, orthopnea and palpitations.  Respiratory: Negative for cough, hemoptysis, shortness of breath and snoring.   Endocrine: Negative for heat intolerance and polyphagia.  Hematologic/Lymphatic: Negative for bleeding problem. Does not bruise/bleed easily.  Skin: Negative for flushing, nail changes, rash and suspicious lesions.  Musculoskeletal: Negative for arthritis, joint pain, muscle cramps, myalgias, neck pain and stiffness.  Gastrointestinal: Negative for abdominal pain, bowel incontinence, diarrhea and excessive appetite.  Genitourinary: Negative for decreased libido, genital sores and incomplete emptying.  Neurological: Negative for brief paralysis, focal weakness, headaches and loss of balance.  Psychiatric/Behavioral: Negative for altered mental status, depression and suicidal ideas.  Allergic/Immunologic: Negative for HIV exposure and persistent infections.    EKGs/Labs/Other Studies Reviewed:    The following studies were reviewed today:   EKG: None today  Recent Labs: 07/13/2020: B Natriuretic Peptide 209.8 12/28/2020: TSH 1.606 01/16/2021: Magnesium 2.2 02/14/2021: ALT 15; BUN 11; Creatinine 0.9; Hemoglobin 12.7; Platelets 234; Potassium 3.9; Sodium 140  Recent  Lipid Panel No results found for: CHOL, TRIG, HDL, CHOLHDL, VLDL, LDLCALC, LDLDIRECT  Physical Exam:    VS:  BP 110/72   Pulse 80   Ht 5\' 5"  (1.651 m)   Wt 194 lb 12.8 oz (88.4 kg)   SpO2 98%   BMI 32.42 kg/m     Wt Readings from Last 3 Encounters:  03/15/21 194 lb 12.8 oz (88.4 kg)  02/14/21 190 lb (86.2 kg)  02/14/21 198 lb 12.8 oz (90.2 kg)     GEN: Well nourished, well developed in no acute distress HEENT: Normal NECK: No JVD; No carotid bruits LYMPHATICS: No lymphadenopathy CARDIAC: S1S2 noted,RRR, no murmurs, rubs, gallops RESPIRATORY:  Clear to auscultation without rales, wheezing or rhonchi  ABDOMEN: Soft, non-tender, non-distended, +bowel sounds, no guarding. EXTREMITIES: No edema, No cyanosis, no clubbing MUSCULOSKELETAL:  No deformity  SKIN: Warm and dry NEUROLOGIC:  Alert and oriented x 3, non-focal PSYCHIATRIC:  Normal affect, good insight  ASSESSMENT:    1. Atherosclerosis of native coronary artery of native heart without angina pectoris   2. Left ventricular dysfunction   3. Hypertension, unspecified type   4. Diabetic polyneuropathy associated with type 2 diabetes mellitus (HCC)   5. Tobacco abuse   6. Obstructive sleep apnea    PLAN:     1.  She appears to be doing well from a cardiovascular standpoint.  She has not had an episode of syncope since I last saw the patient.  She appears to be euvolemic today.  No medication changes will be made today.  2.  Moderate obstructive sleep apnea pending CPAP titration study on May 02, 2021.  3.  We did give the patient [facility].  Samples also given today.  The patient understands the need to lose weight with diet and exercise. We have discussed specific strategies for this.  Blood pressure is acceptable, continue with current antihypertensive regimen.  The patient is in agreement with the above plan. The patient left the office in stable condition.  The patient will follow up  in   Medication Adjustments/Labs and Tests Ordered: Current medicines are reviewed at length with the patient today.  Concerns regarding medicines are outlined above.  No orders of the defined types were placed  in this encounter.  Meds ordered this encounter  Medications  . clopidogrel (PLAVIX) 75 MG tablet    Sig: Take 1 tablet (75 mg total) by mouth daily.    Dispense:  90 tablet    Refill:  3    Patient Instructions  Medication Instructions:  Your physician recommends that you continue on your current medications as directed. Please refer to the Current Medication list given to you today. Refill for Plavix sent to pharmacy.  *If you need a refill on your cardiac medications before your next appointment, please call your pharmacy*   Lab Work: None If you have labs (blood work) drawn today and your tests are completely normal, you will receive your results only by: Marland Kitchen MyChart Message (if you have MyChart) OR . A paper copy in the mail If you have any lab test that is abnormal or we need to change your treatment, we will call you to review the results.   Testing/Procedures: None   Follow-Up: At [facility], you and your health needs are our priority.  As part of our continuing mission to provide you with exceptional heart care, we have created designated Provider Care Teams.  These Care Teams include your primary Cardiologist (physician) and Advanced Practice Providers (APPs -  Physician Assistants and Nurse Practitioners) who all work together to provide you with the care you need, when you need it.  We recommend signing up for the patient portal called "MyChart".  Sign up information is provided on this After Visit Summary.  MyChart is used to connect with patients for Virtual Visits (Telemedicine).  Patients are able to view lab/test results, encounter notes, upcoming appointments, etc.  Non-urgent messages can be sent to your provider as well.   To learn more about what  you can do with MyChart, go to [email or web address].    Your next appointment:   6 month(s)  The format for your next appointment:   In Person  Provider:   Thomasene Ripple, DO   Other Instructions      Adopting a Healthy Lifestyle.  Know what a healthy weight is for you (roughly BMI <25) and aim to maintain this   Aim for 7+ servings of fruits and vegetables daily   65-80+ fluid ounces of water or unsweet tea for healthy kidneys   Limit to max 1 drink of alcohol per day; avoid smoking/tobacco   Limit animal fats in diet for cholesterol and heart health - choose grass fed whenever available   Avoid highly processed foods, and foods high in saturated/trans fats   Aim for low stress - take time to unwind and care for your mental health   Aim for 150 min of moderate intensity exercise weekly for heart health, and weights twice weekly for bone health   Aim for 7-9 hours of sleep daily   When it comes to diets, agreement about the perfect plan isnt easy to find, even among the experts. Experts at the [facility] [employer] developed an idea known as the Healthy Eating Plate. Just imagine a plate divided into logical, healthy portions.   The emphasis is on diet quality:   Load up on vegetables and fruits - one-half of your plate: Aim for color and variety, and remember that potatoes dont count.   Go for whole grains - one-quarter of your plate: Whole wheat, barley, wheat berries, quinoa, oats, brown rice, and foods made with them. If you want pasta, go with whole wheat pasta.  Protein power - one-quarter of your plate: Fish, chicken, beans, and nuts are all healthy, versatile protein sources. Limit red meat.   The diet, however, does go beyond the plate, offering a few other suggestions.   Use healthy plant oils, such as olive, canola, soy, corn, sunflower and peanut. Check the labels, and avoid partially hydrogenated oil, which have unhealthy trans fats.   If  youre thirsty, drink water. Coffee and tea are good in moderation, but skip sugary drinks and limit milk and dairy products to one or two daily servings.   The type of carbohydrate in the diet is more important than the amount. Some sources of carbohydrates, such as vegetables, fruits, whole grains, and beans-are healthier than others.   Finally, stay active  Signed, Thomasene Ripple, DO  03/15/2021 12:06 PM    [facility]

## 2021-03-15 NOTE — Patient Instructions (Signed)
Medication Instructions:  Your physician recommends that you continue on your current medications as directed. Please refer to the Current Medication list given to you today. Refill for Plavix sent to pharmacy.  *If you need a refill on your cardiac medications before your next appointment, please call your pharmacy*   Lab Work: None If you have labs (blood work) drawn today and your tests are completely normal, you will receive your results only by: Marland Kitchen MyChart Message (if you have MyChart) OR . A paper copy in the mail If you have any lab test that is abnormal or we need to change your treatment, we will call you to review the results.   Testing/Procedures: None   Follow-Up: At [facility], you and your health needs are our priority.  As part of our continuing mission to provide you with exceptional heart care, we have created designated Provider Care Teams.  These Care Teams include your primary Cardiologist (physician) and Advanced Practice Providers (APPs -  Physician Assistants and Nurse Practitioners) who all work together to provide you with the care you need, when you need it.  We recommend signing up for the patient portal called "MyChart".  Sign up information is provided on this After Visit Summary.  MyChart is used to connect with patients for Virtual Visits (Telemedicine).  Patients are able to view lab/test results, encounter notes, upcoming appointments, etc.  Non-urgent messages can be sent to your provider as well.   To learn more about what you can do with MyChart, go to [email or web address].    Your next appointment:   6 month(s)  The format for your next appointment:   In Person  Provider:   Thomasene Ripple, DO   Other Instructions

## 2021-04-01 ENCOUNTER — Telehealth: Payer: Self-pay

## 2021-04-01 NOTE — Telephone Encounter (Signed)
Faxed assistance form

## 2021-04-03 ENCOUNTER — Encounter: Payer: Self-pay | Admitting: Internal Medicine

## 2021-04-03 ENCOUNTER — Ambulatory Visit (INDEPENDENT_AMBULATORY_CARE_PROVIDER_SITE_OTHER): Payer: Medicare Other | Admitting: Internal Medicine

## 2021-04-03 ENCOUNTER — Other Ambulatory Visit: Payer: Self-pay

## 2021-04-03 VITALS — BP 128/72 | HR 82 | Ht 65.0 in | Wt 198.8 lb

## 2021-04-03 DIAGNOSIS — I447 Left bundle-branch block, unspecified: Secondary | ICD-10-CM | POA: Diagnosis not present

## 2021-04-03 DIAGNOSIS — I255 Ischemic cardiomyopathy: Secondary | ICD-10-CM

## 2021-04-03 DIAGNOSIS — R55 Syncope and collapse: Secondary | ICD-10-CM | POA: Diagnosis not present

## 2021-04-03 DIAGNOSIS — Z95810 Presence of automatic (implantable) cardiac defibrillator: Secondary | ICD-10-CM | POA: Insufficient documentation

## 2021-04-03 NOTE — Patient Instructions (Signed)
Medication Instructions:  Your physician recommends that you continue on your current medications as directed. Please refer to the Current Medication list given to you today.  Labwork: None ordered.  Testing/Procedures: None ordered.  Follow-Up: Your physician wants you to follow-up in: one year with Lewayne Bunting, MD or one of the following Advanced Practice Providers on your designated Care Team:    Gypsy Balsam, NP  Francis Dowse, PA-C  Casimiro Needle "Mardelle Matte" [place], [place]  Remote monitoring is used to monitor your ICD from home. This monitoring reduces the number of office visits required to check your device to one time per year. It allows [place] to keep an eye on the functioning of your device to ensure it is working properly. You are scheduled for a device check from home on 04/05/2021. You may send your transmission at any time that day. If you have a wireless device, the transmission will be sent automatically. After your physician reviews your transmission, you will receive a postcard with your next transmission date.  Any Other Special Instructions Will Be Listed Below (If Applicable).  If you need a refill on your cardiac medications before your next appointment, please call your pharmacy.

## 2021-04-03 NOTE — Progress Notes (Signed)
HPI [name] returns today for followup. She is a pleasant 70 yo woman with a h/o an ICM, CAD, s/p MI, syncope and LBBB who underwent biv ICD insertion 4 months ago. She is much improved. She has rare episodes of chest pain and takes slntg. She has not had syncope or ICD shock. Her CHF is better. No edema. Allergies  Allergen Reactions  . Bee Venom Anaphylaxis  . Sumatriptan Other (See Comments) and Shortness Of Breath    Migraine worsened  . Tramadol Hives  . Amoxicillin-Pot Clavulanate Diarrhea, Nausea And Vomiting and Other (See Comments)    Other reaction(s): GI Intolerance  . Oxycodone Itching and Hives    Other reaction(s): Other (see comments) "Funny feeling in head"  "off the edge"   . Buprenorphine Hcl     Other reaction(s): Itching  . Clarithromycin     Abdominal pain and diarrhea Abdominal pain and diarrhea Abdominal pain and diarrhea  . Duloxetine Hcl     drowsiness drowsiness drowsiness  . Hydrocodone     Other reaction(s): Itching  . Lactose   . Liraglutide     Abdominal discomfort Abdominal discomfort Abdominal discomfort  . Tramadol Hcl     Other reaction(s): Itching     Current Outpatient Medications  Medication Sig Dispense Refill  . acetaminophen (TYLENOL) 325 MG tablet Take 2 tablets by mouth every 6 (six) hours as needed for pain.    . ACETAMINOPHEN-BUTALBITAL 50-325 MG TABS Take 1 tablet by mouth every 4 (four) hours as needed for severe pain.    Marland Kitchen albuterol (PROVENTIL) (2.5 MG/3ML) 0.083% nebulizer solution INL CONTENTS OF 1 VIAL VIA NEBULIZER Q 4 H PRF WHEEZING OR COUGH    . albuterol (VENTOLIN HFA) 108 (90 Base) MCG/ACT inhaler Inhale 2 puffs into the lungs every 6 (six) hours as needed for shortness of breath or wheezing.    Marland Kitchen aspirin 81 MG EC tablet 81 mg daily.    . baclofen (LIORESAL) 10 MG tablet Take 10 mg by mouth 2 (two) times daily.    . budesonide (PULMICORT) 1 MG/2ML nebulizer solution Take 1 mg by nebulization 2 (two)  times daily as needed (shortness of breath).    . carvedilol (COREG) 3.125 MG tablet TAKE 1 TABLET(3.125 MG) BY MOUTH TWICE DAILY WITH A MEAL 180 tablet 2  . clopidogrel (PLAVIX) 75 MG tablet Take 1 tablet (75 mg total) by mouth daily. 90 tablet 3  . COMBIVENT RESPIMAT 20-100 MCG/ACT AERS respimat Inhale 1 puff into the lungs 2 (two) times daily.    . dapagliflozin propanediol (FARXIGA) 10 MG TABS tablet Take 1 tablet (10 mg total) by mouth daily before breakfast. 30 tablet 12  . diclofenac Sodium (VOLTAREN) 1 % GEL Apply 2 g topically 2 (two) times daily as needed.    . dicyclomine (BENTYL) 10 MG capsule Take 10 mg by mouth every 6 (six) hours as needed for spasms.    Marland Kitchen ENTRESTO 24-26 MG TAKE 1 TABLET BY MOUTH TWICE DAILY 60 tablet 2  . famotidine (PEPCID) 40 MG tablet Take 1 tablet (40 mg total) by mouth at bedtime. 30 tablet 6  . ferrous sulfate 324 MG TBEC Take 324 mg by mouth daily with breakfast.    . fluticasone (FLONASE) 50 MCG/ACT nasal spray Place into both nostrils.    . insulin lispro (HUMALOG) 100 UNIT/ML KwikPen Inject 16 Units into the skin 3 (three) times daily. Administer 16 units three times daily before a meal per  sliding scale    . loratadine (CLARITIN) 10 MG tablet Take 10 mg by mouth daily.    . magnesium oxide (MAG-OX) 400 MG tablet Take 400 mg by mouth daily.    . nitroGLYCERIN (NITROSTAT) 0.4 MG SL tablet Place 1 tablet (0.4 mg total) under the tongue every 5 (five) minutes as needed for chest pain. 25 tablet 3  . nystatin (MYCOSTATIN) 100000 UNIT/ML suspension Take 5 mLs by mouth 4 (four) times daily.    . pantoprazole (PROTONIX) 40 MG tablet Take 1 tablet (40 mg total) by mouth daily. 30 tablet 2  . potassium chloride (KLOR-CON) 10 MEQ tablet Take 10 mEq by mouth daily.    . pregabalin (LYRICA) 50 MG capsule Take 50 mg by mouth 3 (three) times daily.    . rosuvastatin (CRESTOR) 40 MG tablet Take 40 mg by mouth daily.    . SEREVENT DISKUS 50 MCG/DOSE diskus inhaler  Inhale 1 puff into the lungs 2 (two) times daily.    Evaristo Bury FLEXTOUCH 200 UNIT/ML FlexTouch Pen Inject 40 Units into the skin 2 (two) times daily.    . TRINTELLIX 5 MG TABS tablet Take 5 mg by mouth at bedtime.    . valACYclovir (VALTREX) 1000 MG tablet Take 1,000 mg by mouth 2 (two) times daily as needed (flares).    . vitamin B-12 (CYANOCOBALAMIN) 1000 MCG tablet Take 1,000 mcg by mouth daily.    . Vitamin D, Ergocalciferol, (DRISDOL) 1.25 MG (50000 UNIT) CAPS capsule Take 50,000 Units by mouth once a week. sundays    . WAL-DRYL ALLERGY 12.5 MG/5ML liquid Take 12.5 mg by mouth daily as needed for allergies or itching.     No current facility-administered medications for this visit.     Past Medical History:  Diagnosis Date  . Anemia 12/12/2019  . Arthralgia 01/14/2020  . Arthritis   . Asthma   . Atherosclerotic heart disease of native coronary artery without angina pectoris 04/13/2014  . CHF (congestive heart failure) (HCC)   . Chronic bilateral low back pain with bilateral sciatica 01/13/2019  . Chronic bladder pain 11/08/2014   Last Assessment & Plan:  Formatting of this note might be different from the original. Patient has chronic pain syndrome in general I think this is unfortunately worsening her pelvic pain and bladder pain her examination today was very reassuring I am advised her to use the estrogen cream I did start her on Elavil 10 milligrams at that time if she does tolerated will increase it to 25 milligrams.   . Chronic headaches   . Chronic idiopathic constipation 12/22/2014   Formatting of this note might be different from the original. Last Assessment & Plan:  For better bowel emptying please use either Citrucel / Benefiber start with 2 tablespoons daily and titrate up or down to effect.  Also please use glycerin suppositories as needed to assist in evacuation. Last Assessment & Plan:  Formatting of this note might be different from the original. For better bowel empt  .  Chronic obstructive pulmonary disease, unspecified (HCC) 03/18/2018  . Class 1 obesity due to excess calories with serious comorbidity and body mass index (BMI) of 31.0 to 31.9 in adult 06/26/2020  . Colon polyps   . COPD (chronic obstructive pulmonary disease) (HCC)   . Coronary artery disease   . DDD (degenerative disc disease), cervical 01/13/2019  . Depression 11/05/2019  . Diabetic polyneuropathy associated with type 2 diabetes mellitus (HCC) 02/24/2019  . Diverticulitis 05/20/2018  . Diverticulitis  of colon 03/18/2018  . Family history of ischemic heart disease (IHD) 08/16/2013  . Fibromyalgia affecting multiple sites 01/14/2020  . Gastro-esophageal reflux disease without esophagitis 01/13/2019  . Glaucoma 11/08/2014  . Hordeolum externum of left upper eyelid 03/13/2020  . Hypertension 11/08/2014  . IBS (irritable bowel syndrome)   . Iron deficiency anemia 01/13/2019  . Left renal mass 11/08/2019  . Leukocytosis 05/28/2018  . Low vitamin B12 level 03/13/2020  . Low vitamin D level 12/12/2019  . LV dysfunction 05/31/2019  . Malignant essential hypertension 01/22/2016  . Migraine, unspecified, not intractable, without status migrainosus 11/08/2014  . Mixed hyperlipidemia 01/13/2019  . Myocardial infarction (HCC)   . Other premature beats 11/08/2014  . 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 PVD (peripheral vascular disease) (HCC) 04/08/2017  . 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 Trochanteric bursitis of right hip 04/20/2019  . Type 2 diabetes mellitus without complications (HCC) 12/08/2013  . Vaginal atrophy 11/08/2014   Formatting of this note might be different from the original. Last Assessment & Plan:  For vaginal atrophy please place a pea size dab of Estrogen vaginal cream  into the vagina 3 times a week ( Monday, Wednesday, Friday) Last Assessment & Plan:  Formatting of this note might be different from the original. For vaginal atrophy please place a pea  size dab of Estrogen vaginal cream  into the vagina     ROS:   All systems reviewed and negative except as noted in the HPI.   Past Surgical History:  Procedure Laterality Date  . ABDOMINAL HYSTERECTOMY    . BIV ICD INSERTION CRT-D N/A 12/31/2020   Procedure: BIV ICD INSERTION CRT-D;  Surgeon: Marinus Maw, MD;  Location: [facility] INVASIVE CV LAB;  Service: Cardiovascular;  Laterality: N/A;  . BLEPHAROPLASTY Bilateral   . COLON SURGERY    . CORONARY ANGIOPLASTY WITH STENT PLACEMENT  2020   X3  . CORONARY STENT INTERVENTION N/A 08/03/2020   Procedure: CORONARY STENT INTERVENTION;  Surgeon: Tonny Bollman, MD;  [facility] INVAS[facility] CV LAB;  Service: Cardiovascular;  Laterality: N/A;  . INTRAVASCULAR PRESSURE WIRE/FFR STUDY N/A 08/03/2020   Procedure: INTRAVASCULAR PRESSURE WIRE/FFR STUDY;  Surgeon: Tonny Bollman, MD;  Location: [facility] INVASIVE CV LAB;  Service: Cardiovascular;  Laterality: N/A;  . LEFT HEART CATH AND CORONARY ANGIOGRAPHY N/A 08/03/2020   Procedure: LEFT HEART CATH AND CORONARY ANGIOGRAPHY;  Surgeon: Tonny Bollman, MD;  Location: [facility] INVASIVE CV LAB;  Service: Cardiovascular;  Laterality: N/A;  . REFRACTIVE SURGERY Left    [place]     Family History  Problem Relation Age of Onset  . Lung cancer Mother   . Coronary artery disease Mother   . Diabetes Mother   . Glaucoma Mother   . Hypertension Mother   . Colon polyps Mother   . Migraines Father   . Heart disease Father   . Diabetes Maternal Grandmother   . Diabetes Daughter   . Migraines Son   . Hypertension Brother      Social History   Socioeconomic History  . Marital status: Married    Spouse name: Not on file  . Number of children: 3  . Years of education: Not on file  . Highest education level: Not on file  Occupational History  . Occupation: retired  Tobacco Use  . Smoking status: Former Smoker    Types: Cigarettes    Quit date: 2021    Years since quitting: 1.3  .  Smokeless tobacco: Never Used   Vaping Use  . Vaping Use: Never used  Substance and Sexual Activity  . Alcohol use: Never  . Drug use: Never  . Sexual activity: Not on file  Other Topics Concern  . Not on file  Social History Narrative  . Not on file   Social Determinants of Health   Financial Resource Strain: Not on file  Food Insecurity: Not on file  Transportation Needs: Not on file  Physical Activity: Not on file  Stress: Not on file  Social Connections: Not on file  Intimate Partner Violence: Not on file     BP 128/72   Pulse 82   Ht 5\' 5"  (1.651 m)   Wt 198 lb 12.8 oz (90.2 kg)   SpO2 97%   BMI 33.08 kg/m   Physical Exam:  Well appearing NAD HEENT: Unremarkable Neck:  No JVD, no thyromegally Lymphatics:  No adenopathy Back:  No CVA tenderness Lungs:  Clear HEART:  Regular rate rhythm, no murmurs, no rubs, no clicks Abd:  soft, positive bowel sounds, no organomegally, no rebound, no guarding Ext:  2 plus pulses, no edema, no cyanosis, no clubbing Skin:  No rashes no nodules Neuro:  CN II through XII intact, motor grossly intact  EKG - nsr with ventricular pacing  DEVICE  Normal device function.  See PaceArt for details.   Assess/Plan: 1. Chronic systolic heart failure - her symptoms are improved, now class 2A. She will continue her current meds. 2. CAD - she has rare anginal symptoms. We will follow. 3. Syncope - none since her biv ICD 4. ICD - her [employer] biv ICD is working normally. We will recheck in several months.  Glen burnie Skiler Olden,MD

## 2021-04-08 ENCOUNTER — Ambulatory Visit (INDEPENDENT_AMBULATORY_CARE_PROVIDER_SITE_OTHER): Payer: Medicare Other

## 2021-04-08 DIAGNOSIS — I255 Ischemic cardiomyopathy: Secondary | ICD-10-CM

## 2021-04-09 LAB — CUP PACEART REMOTE DEVICE CHECK
Battery Remaining Longevity: 96 mo
Battery Remaining Percentage: 100 %
Brady Statistic RA Percent Paced: 0 %
Brady Statistic RV Percent Paced: 100 %
Date Time Interrogation Session: 20220506120400
HighPow Impedance: 79 Ohm
Implantable Lead Implant Date: 20220131
Implantable Lead Implant Date: 20220131
Implantable Lead Implant Date: 20220131
Implantable Lead Location: 753858
Implantable Lead Location: 753859
Implantable Lead Location: 753860
Implantable Lead Model: 137
Implantable Lead Model: 3830
Implantable Lead Model: 7840
Implantable Lead Serial Number: 1047234
Implantable Lead Serial Number: 301179
Implantable Pulse Generator Implant Date: 20220131
Lead Channel Impedance Value: 562 Ohm
Lead Channel Impedance Value: 601 Ohm
Lead Channel Impedance Value: 816 Ohm
Lead Channel Setting Pacing Amplitude: 0.1 V
Lead Channel Setting Pacing Amplitude: 3.5 V
Lead Channel Setting Pacing Amplitude: 3.5 V
Lead Channel Setting Pacing Pulse Width: 0.1 ms
Lead Channel Setting Pacing Pulse Width: 0.4 ms
Lead Channel Setting Sensing Sensitivity: 0.5 mV
Lead Channel Setting Sensing Sensitivity: 1 mV
Pulse Gen Serial Number: 149649

## 2021-04-17 ENCOUNTER — Other Ambulatory Visit: Payer: Self-pay | Admitting: Cardiology

## 2021-04-26 NOTE — Progress Notes (Signed)
Remote ICD transmission.   

## 2021-05-02 ENCOUNTER — Encounter (HOSPITAL_BASED_OUTPATIENT_CLINIC_OR_DEPARTMENT_OTHER): Payer: Medicare Other | Admitting: Cardiology

## 2021-05-03 ENCOUNTER — Telehealth: Payer: Self-pay | Admitting: Cardiology

## 2021-05-03 ENCOUNTER — Ambulatory Visit: Payer: Medicare Other | Admitting: Physician Assistant

## 2021-05-03 NOTE — Telephone Encounter (Signed)
*  STAT* If patient is at the pharmacy, call can be transferred to refill team.   1. Which medications need to be refilled? (please list name of each medication and dose if known)  Furosemide (patient unsure of dose)  2. Which pharmacy/location (including street and city if local pharmacy) is medication to be sent to?  [id number] - [place], [place] - [address]  3. Do they need a 30 day or 90 day supply? 90 day supply  Patient states she has 2 tablets remaining

## 2021-05-06 ENCOUNTER — Telehealth: Payer: Self-pay

## 2021-05-06 NOTE — Telephone Encounter (Signed)
Patient was returning call. Please advise ?

## 2021-05-06 NOTE — Telephone Encounter (Signed)
Spoke with patient, see chart.    

## 2021-05-06 NOTE — Telephone Encounter (Signed)
Spoke with patient see chart.  

## 2021-05-06 NOTE — Telephone Encounter (Signed)
Left message for patient to return our call, trying to inquire about her Lasix.

## 2021-05-06 NOTE — Telephone Encounter (Signed)
Spoke with patient she states she called the insurance company because the refill request was denied for her Torsemide. Patient was unsure why it was denied, the insurance got the pharmacy to refill the prescription. Patient will call back and update update [place] about her refill when she gets it. No further questions or concerns at this time.

## 2021-05-20 ENCOUNTER — Other Ambulatory Visit: Payer: Self-pay

## 2021-05-27 ENCOUNTER — Other Ambulatory Visit: Payer: Self-pay | Admitting: Cardiology

## 2021-05-27 NOTE — Telephone Encounter (Signed)
Rx sent 

## 2021-06-03 NOTE — Progress Notes (Signed)
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 S/P left hemicolectomy secondary to diverticular disease 05/2016 and colon polyps. She is followed by Dr. Lavon Paganini.   She awakens in the morning with a "stomach ache" for the past month. She has upper and lower abdominal pain, more to the RLQ than LLQ. She has chronic abdominal pain but for the past month the upper and RLQ pains have worsened. Increased RLQ pain when walking. Eating does not trigger or worsen her pain. No N/V. No fever. She complains of increased abdominal bloat and gas per the rectum. Eating does not worsen her abdominal pain. No constipation. She is passing 2 to 3 normal formed darker brown bowel movements daily. Stools are a bit darker since taking oral iron. No black stools. No rectal bleeding. She receives IV iron as needed. No heartburn or dysphagia. She remains on Pantoprazole 40mg  QD. She is on Plavix. Labs 05/30/2021 showed WBC level of 6.5. Hg 12.8. HCT 39.9. PLT 276.   Colonoscopy October 15, 2018 showed evidence of left hemicolectomy with anastomosis 2 cm from the anal verge.  Diverticulosis. EGD October 25, 2018 showed gastritis, hiatal hernia.  Gastric biopsies negative for H. Pylori Small bowel video capsule October 07, 2018: AVM of the proximal jejunum, nonbleeding.   Colonoscopy October 07, 2019: 3 diminutive tubular adenomas removed, few scattered diverticula proximal to the anastomosis, left hemicolectomy for diverticular disease. Recall colonoscopy 5 years recommended.     Past Medical History:  Diagnosis Date   Anemia 12/12/2019   Arthralgia 01/14/2020   Arthritis    Asthma     Atherosclerotic heart disease of native coronary artery without angina pectoris 04/13/2014   CHF (congestive heart failure) (HCC)    Chronic bilateral low back pain with bilateral sciatica 01/13/2019   Chronic bladder pain 11/08/2014   Last Assessment & Plan:  Formatting of this note might be different from the original. Patient has chronic pain syndrome in general I think this is unfortunately worsening her pelvic pain and bladder pain her examination today was very reassuring I am advised her to use the estrogen cream I did start her on Elavil 10 milligrams at that time if she does tolerated will increase it to 25 milligrams.    Chronic headaches    Chronic idiopathic constipation 12/22/2014   Formatting of this note might be different from the original. Last Assessment & Plan:  For better bowel emptying please use either Citrucel / Benefiber start with 2 tablespoons daily and titrate up or down to effect.  Also please use glycerin suppositories as needed to assist in evacuation. Last Assessment & Plan:  Formatting of this note might be different from the original. For better bowel empt   Chronic obstructive pulmonary disease, unspecified (HCC) 03/18/2018   Class 1 obesity due to excess calories with serious comorbidity and body mass index (BMI) of 31.0 to 31.9 in adult 06/26/2020   Colon polyps    COPD (chronic obstructive pulmonary disease) (HCC)    Coronary artery disease    DDD (degenerative disc disease), cervical 01/13/2019   Depression 11/05/2019   Diabetic polyneuropathy associated with type 2 diabetes mellitus (HCC) 02/24/2019  Diverticulitis 05/20/2018   Diverticulitis of colon 03/18/2018   Family history of ischemic heart disease (IHD) 08/16/2013   Fibromyalgia affecting multiple sites 01/14/2020   Gastro-esophageal reflux disease without esophagitis 01/13/2019   Glaucoma 11/08/2014   Hordeolum externum of left upper eyelid 03/13/2020   Hypertension 11/08/2014   IBS (irritable bowel  syndrome)    Iron deficiency anemia 01/13/2019   Left renal mass 11/08/2019   Leukocytosis 05/28/2018   Low vitamin B12 level 03/13/2020   Low vitamin D level 12/12/2019   LV dysfunction 05/31/2019   Malignant essential hypertension 01/22/2016   Migraine, unspecified, not intractable, without status migrainosus 11/08/2014   Mixed hyperlipidemia 01/13/2019   Myocardial infarction [facility])    Other premature beats 11/08/2014   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 Last Assessment & Plan:  For vaginal atrophy please place a pea size dab of Estrogen vaginal cream  into the vagina 3 times a week ( Monday, Wednesday, Friday) Last Assessment & Plan:  Formatting of this note might be different from the original. For vaginal atrophy please place a pea size dab of Estrogen vaginal cream  into the vagina     Current Outpatient Medications on File Prior to Visit  Medication Sig Dispense Refill   acetaminophen (TYLENOL) 325 MG tablet Take 2 tablets by mouth every 6 (six) hours as needed for pain.     ACETAMINOPHEN-BUTALBITAL 50-325 MG TABS Take 1 tablet by mouth every 4 (four) hours as needed for severe pain.     albuterol (PROVENTIL) (2.5 MG/3ML) 0.083% nebulizer solution INL CONTENTS OF 1 VIAL VIA NEBULIZER Q 4 H PRF WHEEZING OR COUGH     albuterol (VENTOLIN HFA) 108 (90 Base) MCG/ACT inhaler Inhale 2 puffs into the lungs every 6 (six) hours as needed for shortness of breath or wheezing.     aspirin 81 MG EC tablet 81 mg daily.     baclofen (LIORESAL) 10 MG tablet Take 10 mg by mouth 2 (two) times daily.     budesonide (PULMICORT) 1 MG/2ML nebulizer solution Take 1 mg by nebulization 2  (two) times daily as needed (shortness of breath).     carvedilol (COREG) 3.125 MG tablet TAKE 1 TABLET(3.125 MG) BY MOUTH TWICE DAILY WITH A MEAL 180 tablet 2   clopidogrel (PLAVIX) 75 MG tablet Take 1 tablet (75 mg total) by mouth daily. 90 tablet 3   COMBIVENT RESPIMAT 20-100 MCG/ACT AERS respimat Inhale 1 puff into the lungs 2 (two) times daily.     dapagliflozin propanediol (FARXIGA) 10 MG TABS tablet Take 1 tablet (10 mg total) by mouth daily before breakfast. 30 tablet 12   diclofenac Sodium (VOLTAREN) 1 % GEL Apply 2 g topically 2 (two) times daily as needed.     dicyclomine (BENTYL) 10 MG capsule Take 10 mg by mouth every 6 (six) hours as needed for spasms.     ENTRESTO 24-26 MG TAKE 1 TABLET BY MOUTH TWICE DAILY 60 tablet 2   ferrous sulfate 324 MG TBEC Take 324 mg by mouth daily with breakfast.     fluticasone (FLONASE) 50 MCG/ACT nasal spray Place into both nostrils.     insulin lispro (HUMALOG) 100 UNIT/ML KwikPen  Inject 16 Units into the skin 3 (three) times daily. Administer 16 units three times daily before a meal per sliding scale     isosorbide mononitrate (IMDUR) 30 MG 24 hr tablet Take by mouth.     loratadine (CLARITIN) 10 MG tablet Take 10 mg by mouth daily.     magnesium oxide (MAG-OX) 400 MG tablet Take 400 mg by mouth daily.     nitroGLYCERIN (NITROSTAT) 0.4 MG SL tablet Place 1 tablet (0.4 mg total) under the tongue every 5 (five) minutes as needed for chest pain. 25 tablet 3   nystatin (MYCOSTATIN) 100000 UNIT/ML suspension Take 5 mLs by mouth 4 (four) times daily.     pantoprazole (PROTONIX) 40 MG tablet TAKE 1 TABLET(40 MG) BY MOUTH DAILY 90 tablet 2   potassium chloride (KLOR-CON) 10 MEQ tablet Take 10 mEq by mouth daily.     pregabalin (LYRICA) 50 MG capsule Take 50 mg by mouth 3 (three) times daily.     rosuvastatin (CRESTOR) 40 MG tablet Take 40 mg by mouth daily.     SEREVENT DISKUS 50 MCG/DOSE diskus inhaler Inhale 1 puff into the lungs 2 (two) times daily.      ticagrelor (BRILINTA) 90 MG TABS tablet Take by mouth.     torsemide (DEMADEX) 20 MG tablet Take by mouth.     TRESIBA FLEXTOUCH 200 UNIT/ML FlexTouch Pen Inject 40 Units into the skin 2 (two) times daily.     TRINTELLIX 5 MG TABS tablet Take 5 mg by mouth at bedtime.     valACYclovir (VALTREX) 1000 MG tablet Take 1,000 mg by mouth 2 (two) times daily as needed (flares).     vitamin B-12 (CYANOCOBALAMIN) 1000 MCG tablet Take 1,000 mcg by mouth daily.     Vitamin D, Ergocalciferol, (DRISDOL) 1.25 MG (50000 UNIT) CAPS capsule Take 50,000 Units by mouth once a week. sundays     WAL-DRYL ALLERGY 12.5 MG/5ML liquid Take 12.5 mg by mouth daily as needed for allergies or itching.     No current facility-administered medications on file prior to visit.   Allergies  Allergen Reactions   Bee Venom Anaphylaxis   Sumatriptan Other (See Comments) and Shortness Of Breath    Migraine worsened   Tramadol Hives   Amoxicillin-Pot Clavulanate Diarrhea, Nausea And Vomiting and Other (See Comments)    Other reaction(s): GI Intolerance   Oxycodone Itching and Hives    Other reaction(s): Other (see comments) "Funny feeling in head"  "off the edge"    Buprenorphine Hcl     Other reaction(s): Itching   Clarithromycin     Abdominal pain and diarrhea Abdominal pain and diarrhea Abdominal pain and diarrhea   Duloxetine Hcl     drowsiness drowsiness drowsiness   Hydrocodone     Other reaction(s): Itching   Lactose    Liraglutide     Abdominal discomfort Abdominal discomfort Abdominal discomfort   Tramadol Hcl     Other reaction(s): Itching    Current Medications, Allergies, Past Medical History, Past Surgical History, Family History and Social History were reviewed in [employer] record.   Review of Systems:   Constitutional: Negative for fever, sweats, chills or weight loss.  Respiratory: Negative for shortness of breath.   Cardiovascular: Negative for chest pain,  palpitations and leg swelling.  Gastrointestinal: See HPI.  Musculoskeletal: Negative for back pain or muscle aches.  Neurological: Negative for dizziness, headaches or paresthesias.    Physical Exam: BP 118/80 (BP Location: Left Arm,  Patient Position: Sitting, Cuff Size: Normal)   Pulse 92   Ht 5' 3.5" (1.613 m)   Wt 200 lb (90.7 kg)   BMI 34.87 kg/m   General: 70 year old female in no acute distress. Head: Normocephalic and atraumatic. Eyes: No scleral icterus. Conjunctiva pink . Ears: Normal auditory acuity. Mouth: Dentition intact. No ulcers or lesions.  Chest: ICD palpated left subclavian area.  Lungs: Clear throughout to auscultation. Heart: Regular rate and rhythm, no murmur. Abdomen: Soft, nondistended. Epigastric and RLQ tenderness without rebound or guarding. No masses or hepatomegaly. Normal bowel sounds x 4 quadrants.  Rectal: Deferred.  Musculoskeletal: Symmetrical with no gross deformities. Extremities: No edema. Neurological: Alert oriented x 4. No focal deficits.  Psychological: Alert and cooperative. Normal mood and affect  Assessment and Recommendations:  44. 70 year old female with epigastric and RLQ pain x 1 month.  Variable chronic upper and lower abdominal pains.  -CTAP with oral contrast only  -Increase Famotidine 40mg  po bid for next 2 to 4 weeks, continue Pantoprazole 40mg  po Qam -Probiotic of choice once daily  -Dicyclomine 10mg  one po tid PRN, patient has supply  -Patient to present to the ED if severe abdominal pain develops  -Further follow up to be determined after CTAP results received   2. 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 Hg 12.8 on 6/30/202.   3. Colon polyps. Her most recent colonoscopy was October 07, 2019: 3 diminutive tubular adenomas removed -Next colonoscopy due 10/2024  4. Past colon resection secondary to diverticular disease   5. Coronary artery disease s/p stent on Plavix, ischemic cardiomyopathy with severe CHF s/p  biventricular ICD placement 12/2020

## 2021-06-04 ENCOUNTER — Ambulatory Visit (INDEPENDENT_AMBULATORY_CARE_PROVIDER_SITE_OTHER): Payer: Medicare Other | Admitting: Nurse Practitioner

## 2021-06-04 ENCOUNTER — Encounter: Payer: Self-pay | Admitting: Nurse Practitioner

## 2021-06-04 VITALS — BP 118/80 | HR 92 | Ht 63.5 in | Wt 200.0 lb

## 2021-06-04 DIAGNOSIS — R1011 Right upper quadrant pain: Secondary | ICD-10-CM

## 2021-06-04 DIAGNOSIS — K5732 Diverticulitis of large intestine without perforation or abscess without bleeding: Secondary | ICD-10-CM

## 2021-06-04 DIAGNOSIS — R1013 Epigastric pain: Secondary | ICD-10-CM

## 2021-06-04 MED ORDER — FAMOTIDINE 40 MG PO TABS: 40.0000 mg | ORAL_TABLET | Freq: Two times a day (BID) | ORAL | 2 refills | Status: DC

## 2021-06-04 NOTE — Patient Instructions (Addendum)
If you are age 71 or older, your body mass index should be between 23-30. Your Body mass index is 34.87 kg/m. If this is out of the aforementioned range listed, please consider follow up with your Primary Care Provider.  IMAGING: You will be contacted by [facility] Scheduling (Your caller ID will indicate phone # [phone]) in the next 2 days to schedule your Abdominal CT scan. If you have not heard from them within 2 business days, please call [facility] Scheduling at [phone] to follow up on the status of your appointment.    RECOMMENDATIONS: Continue Pantoprazole 40 MG every morning. Famotidine (Pepcid) 40 MG, once at noon and once at bedtime. If insurance does not cover this then you can buy it over the counter. Take a Probiotic of your choice once a day.  Please go to the emergency room if you develop severe abdominal pain.   It was great seeing you today! Thank you for entrusting me with your care and choosing [facility].  Arnaldo Natal, CRNP  The [place] [facility] providers would like to encourage you to use [facility] to communicate with providers for non-urgent requests or questions.  Due to long hold times on the telephone, sending your provider a message by [facility] may be faster and more efficient way to get a response. Please allow 48 business hours for a response.  Please remember that this is for non-urgent requests/questions.

## 2021-06-12 ENCOUNTER — Other Ambulatory Visit: Payer: Self-pay

## 2021-06-12 ENCOUNTER — Ambulatory Visit (HOSPITAL_COMMUNITY)
Admission: RE | Admit: 2021-06-12 | Discharge: 2021-06-12 | Disposition: A | Discharge status: Home / Self Care | Payer: Medicare Other | Source: Ambulatory Visit | Attending: Nurse Practitioner | Admitting: Nurse Practitioner

## 2021-06-12 DIAGNOSIS — R1013 Epigastric pain: Secondary | ICD-10-CM | POA: Diagnosis present

## 2021-06-12 DIAGNOSIS — R1011 Right upper quadrant pain: Secondary | ICD-10-CM | POA: Diagnosis present

## 2021-06-12 DIAGNOSIS — K5732 Diverticulitis of large intestine without perforation or abscess without bleeding: Secondary | ICD-10-CM

## 2021-06-14 DIAGNOSIS — I255 Ischemic cardiomyopathy: Secondary | ICD-10-CM

## 2021-06-14 DIAGNOSIS — E785 Hyperlipidemia, unspecified: Secondary | ICD-10-CM

## 2021-06-14 DIAGNOSIS — I509 Heart failure, unspecified: Secondary | ICD-10-CM

## 2021-06-14 DIAGNOSIS — I447 Left bundle-branch block, unspecified: Secondary | ICD-10-CM

## 2021-06-14 DIAGNOSIS — Z794 Long term (current) use of insulin: Secondary | ICD-10-CM

## 2021-06-14 DIAGNOSIS — E1169 Type 2 diabetes mellitus with other specified complication: Secondary | ICD-10-CM

## 2021-06-14 DIAGNOSIS — I10 Essential (primary) hypertension: Secondary | ICD-10-CM

## 2021-06-14 DIAGNOSIS — Z950 Presence of cardiac pacemaker: Secondary | ICD-10-CM

## 2021-06-15 DIAGNOSIS — I255 Ischemic cardiomyopathy: Secondary | ICD-10-CM

## 2021-06-15 DIAGNOSIS — I2510 Atherosclerotic heart disease of native coronary artery without angina pectoris: Secondary | ICD-10-CM

## 2021-06-15 DIAGNOSIS — E785 Hyperlipidemia, unspecified: Secondary | ICD-10-CM

## 2021-06-15 DIAGNOSIS — I119 Hypertensive heart disease without heart failure: Secondary | ICD-10-CM

## 2021-06-15 DIAGNOSIS — I5020 Unspecified systolic (congestive) heart failure: Secondary | ICD-10-CM

## 2021-06-15 DIAGNOSIS — G4733 Obstructive sleep apnea (adult) (pediatric): Secondary | ICD-10-CM

## 2021-06-16 DIAGNOSIS — I255 Ischemic cardiomyopathy: Secondary | ICD-10-CM | POA: Diagnosis not present

## 2021-06-16 DIAGNOSIS — I2510 Atherosclerotic heart disease of native coronary artery without angina pectoris: Secondary | ICD-10-CM | POA: Diagnosis not present

## 2021-06-16 DIAGNOSIS — I5020 Unspecified systolic (congestive) heart failure: Secondary | ICD-10-CM | POA: Diagnosis not present

## 2021-06-16 DIAGNOSIS — G4733 Obstructive sleep apnea (adult) (pediatric): Secondary | ICD-10-CM | POA: Diagnosis not present

## 2021-06-17 DIAGNOSIS — G4733 Obstructive sleep apnea (adult) (pediatric): Secondary | ICD-10-CM | POA: Diagnosis not present

## 2021-06-17 DIAGNOSIS — I2510 Atherosclerotic heart disease of native coronary artery without angina pectoris: Secondary | ICD-10-CM | POA: Diagnosis not present

## 2021-06-17 DIAGNOSIS — I255 Ischemic cardiomyopathy: Secondary | ICD-10-CM | POA: Diagnosis not present

## 2021-06-17 DIAGNOSIS — I5020 Unspecified systolic (congestive) heart failure: Secondary | ICD-10-CM | POA: Diagnosis not present

## 2021-06-27 ENCOUNTER — Telehealth: Payer: Self-pay | Admitting: Cardiology

## 2021-06-27 NOTE — Telephone Encounter (Signed)
Pt is calling stating Dr. Dulce Sellar advised her in the hospital that someone would be calling her to setup an appt with him for a hospital f/u due to him covering for Dr.Tobb. No one has contacted her in regards to this. Please advise.

## 2021-07-02 ENCOUNTER — Telehealth: Payer: Self-pay | Admitting: Nurse Practitioner

## 2021-07-02 NOTE — Telephone Encounter (Signed)
Called patient to set her up with an appointment. She verbalized understanding.

## 2021-07-02 NOTE — Telephone Encounter (Signed)
Inbound call from patient requesting call back for results.

## 2021-07-02 NOTE — Telephone Encounter (Signed)
     Pt is calling back to f/u, she wanted to ask if she can now get in with Dr. Servando Salina for sooner appt

## 2021-07-02 NOTE — Telephone Encounter (Signed)
Poke with patient , rec's relayed to her from 7/13 scan. Faxed report to PCP. Nothing further needed at this time.

## 2021-07-03 ENCOUNTER — Ambulatory Visit (HOSPITAL_BASED_OUTPATIENT_CLINIC_OR_DEPARTMENT_OTHER): Payer: Medicare Other | Attending: Cardiovascular Disease | Admitting: Cardiology

## 2021-07-03 ENCOUNTER — Other Ambulatory Visit: Payer: Self-pay

## 2021-07-03 DIAGNOSIS — I493 Ventricular premature depolarization: Secondary | ICD-10-CM | POA: Diagnosis not present

## 2021-07-03 DIAGNOSIS — G4733 Obstructive sleep apnea (adult) (pediatric): Secondary | ICD-10-CM | POA: Diagnosis present

## 2021-07-03 DIAGNOSIS — Z79899 Other long term (current) drug therapy: Secondary | ICD-10-CM | POA: Insufficient documentation

## 2021-07-04 NOTE — Procedures (Signed)
   Patient Name: Karen Warner, Karen Warner Date: 07/03/2021 Gender: Female D.O.B: Feb 11, 1951 Age (years): 33 Referring Provider: Lavona Mound Tobb DO Height (inches): 65 Interpreting Physician: Armanda Magic MD, ABSM Weight (lbs): 190 RPSGT: Shelah Lewandowsky BMI: 32 MRN: [id number] Neck Size: 14.00  CLINICAL INFORMATION The patient is referred for a BiPAP titration to treat sleep apnea.  SLEEP STUDY TECHNIQUE As per the AASM Manual for the Scoring of Sleep and Associated Events v2.3 (April 2016) with a hypopnea requiring 4% desaturations.  The channels recorded and monitored were frontal, central and occipital EEG, electrooculogram (EOG), submentalis EMG (chin), nasal and oral airflow, thoracic and abdominal wall motion, anterior tibialis EMG, snore microphone, electrocardiogram, and pulse oximetry. Bilevel positive airway pressure (BPAP) was initiated at the beginning of the study and titrated to treat sleep-disordered breathing.  MEDICATIONS Medications self-administered by patient taken the night of the study : BACLOFEN, CARVEDILOL, Entresto, FAMOTIDINE, PREGABALIN, Rosuvastatin, Trintellix  RESPIRATORY PARAMETERS Optimal IPAP Pressure (cm): N/A  AHI at Optimal Pressure (/hr) N/A Optimal EPAP Pressure (cm):N/A  Overall Minimal O2 (%):N/A  Minimal O2 at Optimal Pressure (%): N/A  SLEEP ARCHITECTURE Start Time:11:21:10 PM  Stop Time:5:42:12 AM  Total Time (min):381  Total Sleep Time (min):345.6 Sleep Latency (min):18.0  Sleep Efficiency (%):90.7%  REM Latency (min):82.5  WASO (min):17.5 Stage N1 (%):9.5%  Stage N2 (%):73.8%  Stage N3 (%):0.0%  Stage R (%):16.6 Supine (%):55.86  Arousal Index (/hr):14.2   CARDIAC DATA The 2 lead EKG demonstrated pacemaker generated. The mean heart rate was 73.7 beats per minute. Other EKG findings include: PVCs.  LEG MOVEMENT DATA The total Periodic Limb Movements of Sleep (PLMS) were 0. The PLMS index was 0.0. A PLMS index of <15 is  considered normal in adults.  IMPRESSIONS - An optimal PAP pressure could not be selected for this patient due to ongoing respiratory events.  - Central sleep apnea was not noted during this titration (CAI = 4/h). - Significant oxygen desaturations were not observed during this titration (min O2 = 90.0%). - No snoring was audible during this study. - 2-lead EKG demonstrated: PVCs and Pacing. - Clinically significant periodic limb movements were not noted during this study. Arousals associated with PLMs were rare.  DIAGNOSIS - Obstructive Sleep Apnea (G47.33)  RECOMMENDATIONS - Trial of auto BiPAP therapy with IPAP max 20cm H2O, EPAP min 5cm H2O and PS of 4cm H2O with a Small size Fisher&Paykel Full Face Mask F&P Vitera (new) mask and heated humidification. - Avoid alcohol, sedatives and other CNS depressants that may worsen sleep apnea and disrupt normal sleep architecture. - Sleep hygiene should be reviewed to assess factors that may improve sleep quality. - Weight management and regular exercise should be initiated or continued. - Return to Sleep Center for re-evaluation after 4 weeks of therapy  [Electronically signed] 07/04/2021 08:50 PM  Armanda Magic MD, ABSM Diplomate, American Board of Sleep Medicine

## 2021-07-05 ENCOUNTER — Ambulatory Visit (INDEPENDENT_AMBULATORY_CARE_PROVIDER_SITE_OTHER): Payer: Medicare Other

## 2021-07-05 DIAGNOSIS — I255 Ischemic cardiomyopathy: Secondary | ICD-10-CM | POA: Diagnosis not present

## 2021-07-08 LAB — CUP PACEART REMOTE DEVICE CHECK
Battery Remaining Longevity: 114 mo
Battery Remaining Percentage: 100 %
Brady Statistic RA Percent Paced: 0 %
Brady Statistic RV Percent Paced: 100 %
Date Time Interrogation Session: 20220805033300
HighPow Impedance: 84 Ohm
Implantable Lead Implant Date: 20220131
Implantable Lead Implant Date: 20220131
Implantable Lead Implant Date: 20220131
Implantable Lead Location: 753858
Implantable Lead Location: 753859
Implantable Lead Location: 753860
Implantable Lead Model: 137
Implantable Lead Model: 3830
Implantable Lead Model: 7840
Implantable Lead Serial Number: 1047234
Implantable Lead Serial Number: 301179
Implantable Pulse Generator Implant Date: 20220131
Lead Channel Impedance Value: 606 Ohm
Lead Channel Impedance Value: 610 Ohm
Lead Channel Impedance Value: 825 Ohm
Lead Channel Setting Pacing Amplitude: 0.1 V
Lead Channel Setting Pacing Amplitude: 3.5 V
Lead Channel Setting Pacing Amplitude: 3.5 V
Lead Channel Setting Pacing Pulse Width: 0.1 ms
Lead Channel Setting Pacing Pulse Width: 0.4 ms
Lead Channel Setting Sensing Sensitivity: 0.5 mV
Lead Channel Setting Sensing Sensitivity: 1 mV
Pulse Gen Serial Number: 149649

## 2021-07-15 NOTE — Progress Notes (Signed)
Reviewed and agree with documentation and assessment and plan. K. Veena Shantel Wesely , MD   

## 2021-07-16 ENCOUNTER — Ambulatory Visit (INDEPENDENT_AMBULATORY_CARE_PROVIDER_SITE_OTHER): Payer: Medicare Other | Admitting: Cardiology

## 2021-07-16 ENCOUNTER — Encounter: Payer: Self-pay | Admitting: Cardiology

## 2021-07-16 ENCOUNTER — Other Ambulatory Visit: Payer: Self-pay

## 2021-07-16 VITALS — BP 100/74 | HR 80 | Ht 65.0 in | Wt 196.0 lb

## 2021-07-16 DIAGNOSIS — I2510 Atherosclerotic heart disease of native coronary artery without angina pectoris: Secondary | ICD-10-CM

## 2021-07-16 DIAGNOSIS — Z95810 Presence of automatic (implantable) cardiac defibrillator: Secondary | ICD-10-CM

## 2021-07-16 DIAGNOSIS — I447 Left bundle-branch block, unspecified: Secondary | ICD-10-CM | POA: Diagnosis not present

## 2021-07-16 DIAGNOSIS — E782 Mixed hyperlipidemia: Secondary | ICD-10-CM

## 2021-07-16 DIAGNOSIS — I519 Heart disease, unspecified: Secondary | ICD-10-CM

## 2021-07-16 DIAGNOSIS — I255 Ischemic cardiomyopathy: Secondary | ICD-10-CM

## 2021-07-16 DIAGNOSIS — E6609 Other obesity due to excess calories: Secondary | ICD-10-CM

## 2021-07-16 DIAGNOSIS — Z6831 Body mass index (BMI) 31.0-31.9, adult: Secondary | ICD-10-CM

## 2021-07-16 MED ORDER — LIDOCAINE 5 % EX PTCH: 1.0000 | MEDICATED_PATCH | CUTANEOUS | 0 refills | Status: DC

## 2021-07-16 MED ORDER — VERQUVO 2.5 MG PO TABS: 2.5000 mg | ORAL_TABLET | Freq: Every day | ORAL | 6 refills | Status: DC

## 2021-07-16 NOTE — Patient Instructions (Signed)
Medication Instructions:  Your physician has recommended you make the following change in your medication:  START: Verquvo 2.5 mg once daily  *If you need a refill on your cardiac medications before your next appointment, please call your pharmacy*   Lab Work: None If you have labs (blood work) drawn today and your tests are completely normal, you will receive your results only by: MyChart Message (if you have MyChart) OR A paper copy in the mail If you have any lab test that is abnormal or we need to change your treatment, we will call you to review the results.   Testing/Procedures: None   Follow-Up: At [facility], you and your health needs are our priority.  As part of our continuing mission to provide you with exceptional heart care, we have created designated Provider Care Teams.  These Care Teams include your primary Cardiologist (physician) and Advanced Practice Providers (APPs -  Physician Assistants and Nurse Practitioners) who all work together to provide you with the care you need, when you need it.  We recommend signing up for the patient portal called "MyChart".  Sign up information is provided on this After Visit Summary.  MyChart is used to connect with patients for Virtual Visits (Telemedicine).  Patients are able to view lab/test results, encounter notes, upcoming appointments, etc.  Non-urgent messages can be sent to your provider as well.   To learn more about what you can do with MyChart, go to [email or web address].    Your next appointment:   6 week(s)  The format for your next appointment:   In Person  Provider:   Thomasene Ripple, DO   Other Instructions

## 2021-07-16 NOTE — Progress Notes (Signed)
Cardiology Office Note:    Date:  07/17/2021   ID:  Karen Warner, DOB [birth year], MRN [id number]  PCP:  Hadley Pen, MD  Cardiologist:  Thomasene Ripple, DO  Electrophysiologist:  None   Referring MD: Thomasene Ripple, DO   Chief Complaint  Patient presents with   Hospitalization Follow-up    History of Present Illness:    Karen Warner is a 70 y.o. female with a hx of coronary artery disease status post PCI, ischemic cardiomyopathy status post BiV pacemaker during her hospitalization in January 2022 presented for syncope episode, heart failure reduced ejection fraction recent EF 20 to 25% on her echo which was done in January 2022, sleep apnea is here today for follow-up visit.   I saw the patient most recently on January 16, 2021 at that time she had had a fall and was concerned about information on her device.  We reviewed her device check which was normal.  She did have some bilateral leg edema but was still at her dry weight.  Therefore her diuretics was not changed.  At her visit on January 16, 2021 we continued her torsemide which was every other day.  In terms of her ischemic cardiomyopathy we kept the Entresto, Coreg with no changes.  She was off Aldactone but her blood pressure did not tolerate starting this medication.   On March 15, 2021 she appeared to be doing well from a cardiovascular standpoint.  She was still waiting for her CPAP titration study.  Since I saw the patient she was hospitalized at [facility] as she did have evidence of acute on chronic heart failure exacerbation.  She was diuresed and now back to baseline.  During that time Aldactone was added back to her regimen.  Today she is doing well.  Closer to her baseline.   Past Medical History:  Diagnosis Date   Anemia 12/12/2019   Arthralgia 01/14/2020   Arthritis    Asthma    Atherosclerotic heart disease of native coronary artery without angina pectoris 04/13/2014   CHF  (congestive heart failure) (HCC)    Chronic bilateral low back pain with bilateral sciatica 01/13/2019   Chronic bladder pain 11/08/2014   Last Assessment & Plan:  Formatting of this note might be different from the original. Patient has chronic pain syndrome in general I think this is unfortunately worsening her pelvic pain and bladder pain her examination today was very reassuring I am advised her to use the estrogen cream I did start her on Elavil 10 milligrams at that time if she does tolerated will increase it to 25 milligrams.    Chronic headaches    Chronic idiopathic constipation 12/22/2014   Formatting of this note might be different from the original. Last Assessment & Plan:  For better bowel emptying please use either Citrucel / Benefiber start with 2 tablespoons daily and titrate up or down to effect.  Also please use glycerin suppositories as needed to assist in evacuation. Last Assessment & Plan:  Formatting of this note might be different from the original. For better bowel empt   Chronic obstructive pulmonary disease, unspecified (HCC) 03/18/2018   Class 1 obesity due to excess calories with serious comorbidity and body mass index (BMI) of 31.0 to 31.9 in adult 06/26/2020   Colon polyps    COPD (chronic obstructive pulmonary disease) (HCC)    Coronary artery disease    DDD (degenerative disc disease), cervical 01/13/2019   Depression 11/05/2019  Diabetic polyneuropathy associated with type 2 diabetes mellitus (HCC) 02/24/2019   Diverticulitis 05/20/2018   Diverticulitis of colon 03/18/2018   Family history of ischemic heart disease (IHD) 08/16/2013   Fibromyalgia affecting multiple sites 01/14/2020   Gastro-esophageal reflux disease without esophagitis 01/13/2019   Glaucoma 11/08/2014   Hordeolum externum of left upper eyelid 03/13/2020   Hypertension 11/08/2014   IBS (irritable bowel syndrome)    Iron deficiency anemia 01/13/2019   Left renal mass 11/08/2019   Leukocytosis 05/28/2018   Low  vitamin B12 level 03/13/2020   Low vitamin D level 12/12/2019   LV dysfunction 05/31/2019   Malignant essential hypertension 01/22/2016   Migraine, unspecified, not intractable, without status migrainosus 11/08/2014   Mixed hyperlipidemia 01/13/2019   Myocardial infarction (HCC)    Other premature beats 11/08/2014   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 Last Assessment & Plan:  For vaginal atrophy please place a pea size dab of Estrogen vaginal cream  into the vagina 3 times a week ( Monday, Wednesday, Friday) Last Assessment & Plan:  Formatting of this note might be different from the original. For vaginal atrophy please place a pea size dab of Estrogen vaginal cream  into the vagina     Past Surgical History:  Procedure Laterality Date   ABDOMINAL HYSTERECTOMY     BIV ICD INSERTION CRT-D N/A 12/31/2020   Procedure: BIV ICD INSERTION CRT-D;  Surgeon: Marinus Maw, MD;  Location: [facility] INVASIVE CV LAB;  Service: Cardiovascular;  Laterality: N/A;   BLEPHAROPLASTY Bilateral    COLON SURGERY     CORONARY ANGIOPLASTY WITH STENT PLACEMENT  2020   X3   CORONARY STENT INTERVENTION N/A 08/03/2020   Procedure: CORONARY STENT INTERVENTION;  Surgeon: Tonny Bollman, MD;  Location[facility] INVASIVE CV LAB;  Service: Cardiovascular;  Laterality: N/A;   INTRAVASCULAR PRESSURE WIRE/FFR STUDY N/A 08/03/2020   Procedure: INTRAVASCULAR PRESSURE WIRE/FFR STUDY;  Surgeon: Tonny Bollman, MD;  Location: [facility] INVASIVE CV LAB;  Service: Cardiovascular;  Laterality: N/A;   LEFT HEART CATH AND CORONARY ANGIOGRAPHY N/A 08/03/2020   Procedure: LEFT HEART CATH AND CORONARY ANGIOGRAPHY;  Surgeon: Tonny Bollman, MD;  Location: [facility] INVASIVE CV LAB;  Service: Cardiovascular;  Laterality: N/A;   REFRACTIVE SURGERY Left    [place]    Current Medications: Current Meds  Medication Sig   acetaminophen (TYLENOL) 325 MG tablet Take 2 tablets by mouth every 6 (six) hours as needed for pain.   ACETAMINOPHEN-BUTALBITAL 50-325 MG TABS Take 1 tablet by mouth every 4 (four) hours as needed for severe pain.   albuterol (PROVENTIL) (2.5 MG/3ML) 0.083% nebulizer solution INL CONTENTS OF 1 VIAL VIA NEBULIZER Q 4 H PRF WHEEZING OR COUGH   albuterol (VENTOLIN HFA) 108 (90 Base) MCG/ACT inhaler Inhale 2 puffs into the lungs every 6 (six) hours as needed for shortness of breath or wheezing.   aspirin 81 MG EC tablet 81 mg daily.   baclofen (LIORESAL) 10 MG tablet Take 10 mg by mouth 2 (two) times daily.   carvedilol (COREG) 3.125 MG tablet TAKE 1 TABLET(3.125 MG) BY MOUTH TWICE DAILY WITH A MEAL   clopidogrel (PLAVIX) 75 MG tablet Take 1 tablet (75  mg total) by mouth daily.   dapagliflozin propanediol (FARXIGA) 10 MG TABS tablet Take 1 tablet (10 mg total) by mouth daily before breakfast.   diclofenac Sodium (VOLTAREN) 1 % GEL Apply 2 g topically 2 (two) times daily as needed (pain).   dicyclomine (BENTYL) 10 MG capsule Take 10 mg by mouth every 6 (six) hours as needed for spasms.   ENTRESTO 24-26 MG TAKE 1 TABLET BY MOUTH TWICE DAILY   famotidine (PEPCID) 40 MG tablet Take 40 mg by mouth 3 (three) times daily.   ferrous sulfate 324 MG TBEC Take 324 mg by mouth daily with breakfast.   fluticasone (FLONASE) 50 MCG/ACT nasal spray Place 1 spray into both nostrils daily.   folic acid (FOLVITE) 1 MG tablet Take 1 mg by mouth daily.   insulin lispro (HUMALOG) 100 UNIT/ML KwikPen Inject 16 Units into the skin 3 (three) times daily. Administer 16 units three times daily before a meal per sliding scale   lidocaine (LIDODERM) 5 % Place 1 patch onto the skin daily. Remove & Discard patch within 12 hours or as directed by  MD   loratadine (CLARITIN) 10 MG tablet Take 10 mg by mouth daily.   magnesium oxide (MAG-OX) 400 MG tablet Take 400 mg by mouth daily.   nitroGLYCERIN (NITROSTAT) 0.4 MG SL tablet Place 1 tablet (0.4 mg total) under the tongue every 5 (five) minutes as needed for chest pain.   nystatin (MYCOSTATIN) 100000 UNIT/ML suspension Take 5 mLs by mouth 4 (four) times daily.   pantoprazole (PROTONIX) 40 MG tablet TAKE 1 TABLET(40 MG) BY MOUTH DAILY   potassium chloride (KLOR-CON) 10 MEQ tablet Take 10 mEq by mouth daily.   pregabalin (LYRICA) 50 MG capsule Take 50 mg by mouth 3 (three) times daily.   rosuvastatin (CRESTOR) 40 MG tablet Take 40 mg by mouth daily.   spironolactone (ALDACTONE) 25 MG tablet Take 25 mg by mouth daily.   torsemide (DEMADEX) 20 MG tablet Take 20 mg by mouth daily.   TRESIBA FLEXTOUCH 200 UNIT/ML FlexTouch Pen Inject 40 Units into the skin 2 (two) times daily.   TRINTELLIX 5 MG TABS tablet Take 5 mg by mouth at bedtime.   valACYclovir (VALTREX) 1000 MG tablet Take 1,000 mg by mouth 2 (two) times daily as needed (flares).   Vericiguat (VERQUVO) 2.5 MG TABS Take 2.5 mg by mouth daily.   vitamin B-12 (CYANOCOBALAMIN) 1000 MCG tablet Take 1,000 mcg by mouth daily.   Vitamin D, Ergocalciferol, (DRISDOL) 1.25 MG (50000 UNIT) CAPS capsule Take 50,000 Units by mouth once a week. sundays     Allergies:   Bee venom, Sumatriptan, Tramadol, Amoxicillin-pot clavulanate, Oxycodone, Buprenorphine hcl, Clarithromycin, Duloxetine hcl, Hydrocodone, Lactose, Liraglutide, and Tramadol hcl   Social History   Socioeconomic History   Marital status: Significant Other    Spouse name: Not on file   Number of children: 3   Years of education: Not on file   Highest education level: Not on file  Occupational History   Occupation: retired  Tobacco Use   Smoking status: Former    Types: Cigarettes    Quit date: 2021    Years since quitting: 1.6   Smokeless tobacco: Never  Vaping Use   Vaping  Use: Never used  Substance and Sexual Activity   Alcohol use: Never   Drug use: Never   Sexual activity: Not on file  Other Topics Concern   Not on file  Social History Narrative   Not on file  Social Determinants of Health   Financial Resource Strain: Not on file  Food Insecurity: Not on file  Transportation Needs: Not on file  Physical Activity: Not on file  Stress: Not on file  Social Connections: Not on file     Family History: The patient's family history includes Colon polyps in her mother; Coronary artery disease in her mother; Diabetes in her daughter, maternal grandmother, and mother; Glaucoma in her mother; Heart disease in her father; Hypertension in her brother and mother; Lung cancer in her mother; Migraines in her father and son.  ROS:   Review of Systems  Constitution: Negative for decreased appetite, fever and weight gain.  HENT: Negative for congestion, ear discharge, hoarse voice and sore throat.   Eyes: Negative for discharge, redness, vision loss in right eye and visual halos.  Cardiovascular: Negative for chest pain, dyspnea on exertion, leg swelling, orthopnea and palpitations.  Respiratory: Negative for cough, hemoptysis, shortness of breath and snoring.   Endocrine: Negative for heat intolerance and polyphagia.  Hematologic/Lymphatic: Negative for bleeding problem. Does not bruise/bleed easily.  Skin: Negative for flushing, nail changes, rash and suspicious lesions.  Musculoskeletal: Negative for arthritis, joint pain, muscle cramps, myalgias, neck pain and stiffness.  Gastrointestinal: Negative for abdominal pain, bowel incontinence, diarrhea and excessive appetite.  Genitourinary: Negative for decreased libido, genital sores and incomplete emptying.  Neurological: Negative for brief paralysis, focal weakness, headaches and loss of balance.  Psychiatric/Behavioral: Negative for altered mental status, depression and suicidal ideas.  Allergic/Immunologic:  Negative for HIV exposure and persistent infections.    EKGs/Labs/Other Studies Reviewed:    The following studies were reviewed today:   EKG:  none today  TTE 12/27/2020 IMPRESSIONS     1. Left ventricular ejection fraction, by estimation, is 20 to 25%. The  left ventricle has severely decreased function. The left ventricle  demonstrates global hypokinesis. The left ventricular internal cavity size  was mildly dilated. Left ventricular  diastolic parameters are consistent with Grade I diastolic dysfunction  (impaired relaxation).   2. Right ventricular systolic function is normal. The right ventricular  size is normal. Tricuspid regurgitation signal is inadequate for assessing  PA pressure.   3. The mitral valve is normal in structure. Trivial mitral valve  regurgitation. No evidence of mitral stenosis.   4. The aortic valve is tricuspid. Aortic valve regurgitation is not  visualized. No aortic stenosis is present.   5. The inferior vena cava is normal in size with greater than 50%  respiratory variability, suggesting right atrial pressure of 3 mmHg.   FINDINGS   Left Ventricle: Left ventricular ejection fraction, by estimation, is 20  to 25%. The left ventricle has severely decreased function. The left  ventricle demonstrates global hypokinesis. The left ventricular internal  cavity size was mildly dilated. There  is no left ventricular hypertrophy. Left ventricular diastolic parameters  are consistent with Grade I diastolic dysfunction (impaired relaxation).   Right Ventricle: The right ventricular size is normal. No increase in  right ventricular wall thickness. Right ventricular systolic function is  normal. Tricuspid regurgitation signal is inadequate for assessing PA  pressure.   Left Atrium: Left atrial size was normal in size.   Right Atrium: Right atrial size was normal in size.   Pericardium: There is no evidence of pericardial effusion.   Mitral Valve: The  mitral valve is normal in structure. Trivial mitral  valve regurgitation. No evidence of mitral valve stenosis.   Tricuspid Valve: The tricuspid valve is normal  in structure. Tricuspid  valve regurgitation is not demonstrated.   Aortic Valve: The aortic valve is tricuspid. Aortic valve regurgitation is  not visualized. No aortic stenosis is present.   Pulmonic Valve: The pulmonic valve was normal in structure. Pulmonic valve  regurgitation is not visualized.   Aorta: The aortic root is normal in size and structure.   Venous: The inferior vena cava is normal in size with greater than 50%  respiratory variability, suggesting right atrial pressure of 3 mmHg.   IAS/Shunts: No atrial level shunt detected by color flow Doppler.   Recent Labs: 12/28/2020: TSH 1.606 01/16/2021: Magnesium 2.2 02/14/2021: ALT 15; BUN 11; Creatinine 0.9; Hemoglobin 12.7; Platelets 234; Potassium 3.9; Sodium 140  Recent Lipid Panel No results found for: CHOL, TRIG, HDL, CHOLHDL, VLDL, LDLCALC, LDLDIRECT  Physical Exam:    VS:  BP 100/74 (BP Location: Right Arm, Patient Position: Sitting, Cuff Size: Normal)   Pulse 80   Ht 5\' 5"  (1.651 m)   Wt 196 lb (88.9 kg)   SpO2 94%   BMI 32.62 kg/m     Wt Readings from Last 3 Encounters:  07/16/21 196 lb (88.9 kg)  07/03/21 190 lb (86.2 kg)  06/04/21 200 lb (90.7 kg)     GEN: Well nourished, well developed in no acute distress HEENT: Normal NECK: No JVD; No carotid bruits LYMPHATICS: No lymphadenopathy CARDIAC: S1S2 noted,RRR, no murmurs, rubs, gallops RESPIRATORY:  Clear to auscultation without rales, wheezing or rhonchi  ABDOMEN: Soft, non-tender, non-distended, +bowel sounds, no guarding. EXTREMITIES: No edema, No cyanosis, no clubbing MUSCULOSKELETAL:  No deformity  SKIN: Warm and dry NEUROLOGIC:  Alert and oriented x 3, non-focal PSYCHIATRIC:  Normal affect, good insight  ASSESSMENT:    1. Ischemic cardiomyopathy   2. Atherosclerosis of native  coronary artery of native heart without angina pectoris   3. Left ventricular dysfunction   4. Left bundle branch block   5. Class 1 obesity due to excess calories with serious comorbidity and body mass index (BMI) of 31.0 to 31.9 in adult   6. Mixed hyperlipidemia   7. Biventricular ICD (implantable cardioverter-defibrillator) in place    PLAN:     She appears to be at her baseline today.  She has been diuresed effectively she is back on torsemide 20 mg daily.  We will get kidney function today.  In terms of her cardiomyopathy she is back on optimal medical therapy which includes Aldactone, carvedilol, Entresto, and Farxiga.  Her last echo was in January 2022.  We will repeat an echocardiogram to see if she has had any improvement since her BiV ICD as well as optimal medical therapy.  I am going to start the patient on Verquvo 2.5mg  daily with the intention to titrate up to the maximum tolerable dose.  Hoping this can help with decreasing her hospitalizations as we keep her on maximal medical therapy.  She is also undergoing some therapy due to underlying depression.  She is happy with her therapist for now.  I have offered if needed we can always refer her to another therapy if she feels that this is not working.  For her musculoskeletal pain lidocaine patch has been ordered hopefully this will help.  The patient is in agreement with the above plan. The patient left the office in stable condition.  The patient will follow up in 6 weeks due to medication change.   Medication Adjustments/Labs and Tests Ordered: Current medicines are reviewed at length with the patient today.  Concerns regarding medicines are outlined above.  No orders of the defined types were placed in this encounter.  Meds ordered this encounter  Medications   Vericiguat (VERQUVO) 2.5 MG TABS    Sig: Take 2.5 mg by mouth daily.    Dispense:  60 tablet    Refill:  6   lidocaine (LIDODERM) 5 %    Sig: Place 1 patch  onto the skin daily. Remove & Discard patch within 12 hours or as directed by MD    Dispense:  5 patch    Refill:  0    Patient Instructions  Medication Instructions:  Your physician has recommended you make the following change in your medication:  START: Verquvo 2.5 mg once daily  *If you need a refill on your cardiac medications before your next appointment, please call your pharmacy*   Lab Work: None If you have labs (blood work) drawn today and your tests are completely normal, you will receive your results only by: MyChart Message (if you have MyChart) OR A paper copy in the mail If you have any lab test that is abnormal or we need to change your treatment, we will call you to review the results.   Testing/Procedures: None   Follow-Up: At [facility], you and your health needs are our priority.  As part of our continuing mission to provide you with exceptional heart care, we have created designated Provider Care Teams.  These Care Teams include your primary Cardiologist (physician) and Advanced Practice Providers (APPs -  Physician Assistants and Nurse Practitioners) who all work together to provide you with the care you need, when you need it.  We recommend signing up for the patient portal called "MyChart".  Sign up information is provided on this After Visit Summary.  MyChart is used to connect with patients for Virtual Visits (Telemedicine).  Patients are able to view lab/test results, encounter notes, upcoming appointments, etc.  Non-urgent messages can be sent to your provider as well.   To learn more about what you can do with MyChart, go to [email or web address].    Your next appointment:   6 week(s)  The format for your next appointment:   In Person  Provider:   Thomasene Ripple, DO   Other Instructions    Adopting a Healthy Lifestyle.  Know what a healthy weight is for you (roughly BMI <25) and aim to maintain this   Aim for 7+ servings of fruits and  vegetables daily   65-80+ fluid ounces of water or unsweet tea for healthy kidneys   Limit to max 1 drink of alcohol per day; avoid smoking/tobacco   Limit animal fats in diet for cholesterol and heart health - choose grass fed whenever available   Avoid highly processed foods, and foods high in saturated/trans fats   Aim for low stress - take time to unwind and care for your mental health   Aim for 150 min of moderate intensity exercise weekly for heart health, and weights twice weekly for bone health   Aim for 7-9 hours of sleep daily   When it comes to diets, agreement about the perfect plan isnt easy to find, even among the experts. Experts at the [facility] [employer] developed an idea known as the Healthy Eating Plate. Just imagine a plate divided into logical, healthy portions.   The emphasis is on diet quality:   Load up on vegetables and fruits - one-half of your plate: Aim for color and variety, and  remember that potatoes dont count.   Go for whole grains - one-quarter of your plate: Whole wheat, barley, wheat berries, quinoa, oats, brown rice, and foods made with them. If you want pasta, go with whole wheat pasta.   Protein power - one-quarter of your plate: Fish, chicken, beans, and nuts are all healthy, versatile protein sources. Limit red meat.   The diet, however, does go beyond the plate, offering a few other suggestions.   Use healthy plant oils, such as olive, canola, soy, corn, sunflower and peanut. Check the labels, and avoid partially hydrogenated oil, which have unhealthy trans fats.   If youre thirsty, drink water. Coffee and tea are good in moderation, but skip sugary drinks and limit milk and dairy products to one or two daily servings.   The type of carbohydrate in the diet is more important than the amount. Some sources of carbohydrates, such as vegetables, fruits, whole grains, and beans-are healthier than others.   Finally, stay  active  Signed, Thomasene Ripple, DO  07/17/2021 8:23 AM    [facility]

## 2021-07-17 ENCOUNTER — Other Ambulatory Visit: Payer: Self-pay

## 2021-07-17 DIAGNOSIS — I255 Ischemic cardiomyopathy: Secondary | ICD-10-CM

## 2021-07-17 DIAGNOSIS — R0989 Other specified symptoms and signs involving the circulatory and respiratory systems: Secondary | ICD-10-CM

## 2021-07-22 ENCOUNTER — Telehealth: Payer: Self-pay | Admitting: Cardiology

## 2021-07-22 ENCOUNTER — Telehealth: Payer: Self-pay

## 2021-07-22 MED ORDER — SPIRONOLACTONE 25 MG PO TABS: 12.5000 mg | ORAL_TABLET | Freq: Every day | ORAL | 3 refills | Status: DC

## 2021-07-22 NOTE — Telephone Encounter (Signed)
Left message for patient to return the call. Will relay message to Dr. Servando Salina.

## 2021-07-22 NOTE — Telephone Encounter (Signed)
Called to let pt know to reduce her Aldactone dose to 12.5 mg once daily per Dr. Mallory Shirk recommendations. No answer, left instructions on the machine. Instructed pt to call back to let me know she did receive the instructions.

## 2021-07-22 NOTE — Telephone Encounter (Signed)
Pt c/o BP issue: STAT if pt c/o blurred vision, one-sided weakness or slurred speech  1. What are your last 5 BP readings? 8/11 115/76; 8/12 109/82; 8/14 118/69; 8/15 102/68 8/16 112/68; 8/17 114/67; 8/18 126/81; 8/19 112//72; 8/20 123/92; 8/21 107/63; 8/22 106/74  2. Are you having any other symptoms (ex. Dizziness, headache, blurred vision, passed out)? Headache, a little blurred vision   3. What is your BP issue? Low    Patient calling in because she stated that if her bp was too low then Dr. Servando Salina will take her off the medication. Please advise

## 2021-07-23 NOTE — Telephone Encounter (Signed)
Spoke to patient. She did not receive my message yesterday but now she is aware to cut her Aldactone to 12.05 mg once daily. No questions expressed at this time.

## 2021-07-25 ENCOUNTER — Telehealth: Payer: Self-pay | Admitting: *Deleted

## 2021-07-25 NOTE — Telephone Encounter (Signed)
-----   Message from Quintella Reichert, MD sent at 07/04/2021  8:53 PM EDT ----- Please let patient know that they had a successful BiPAP titration and let DME know that orders are in EPIC.  Please set up 6 week OV with me.

## 2021-07-25 NOTE — Telephone Encounter (Signed)
Informed patient of sleep study results.  LMTCB ON VOICEMAIL. Patient understands her sleep study showed she had a successful BiPAP titration and let DME know that orders are in EPIC.  Please set up 6 week OV with me.   Upon patient request DME selection is [facility]. Patient understands she will be contacted by [facility] to set up his cpap. Patient understands to call if [facility] does not contact her with new setup in a timely manner. Patient understands they will be called once confirmation has been received from [facility] that they have received their new machine to schedule 10 week follow up appointment. [facility] notified of new cpap order  Please add to airview Patient was grateful for the call and thanked me .

## 2021-07-25 NOTE — Progress Notes (Signed)
Remote ICD transmission.   

## 2021-07-26 NOTE — Telephone Encounter (Signed)
Returned call Pt is aware and agreeable to treatment. 

## 2021-07-29 ENCOUNTER — Ambulatory Visit (INDEPENDENT_AMBULATORY_CARE_PROVIDER_SITE_OTHER): Payer: Medicare Other

## 2021-07-29 ENCOUNTER — Other Ambulatory Visit: Payer: Self-pay

## 2021-07-29 DIAGNOSIS — R0989 Other specified symptoms and signs involving the circulatory and respiratory systems: Secondary | ICD-10-CM | POA: Diagnosis not present

## 2021-07-29 DIAGNOSIS — I255 Ischemic cardiomyopathy: Secondary | ICD-10-CM

## 2021-07-29 MED ORDER — PERFLUTREN LIPID MICROSPHERE
1.0000 mL | INTRAVENOUS | Status: AC | PRN
  Administered 2021-07-29: 8 mL via INTRAVENOUS

## 2021-08-01 LAB — ECHOCARDIOGRAM COMPLETE
Area-P 1/2: 4.68 cm2
Calc EF: 24.4 %
S' Lateral: 5.3 cm
Single Plane A2C EF: 23 %
Single Plane A4C EF: 30.2 %

## 2021-08-06 ENCOUNTER — Other Ambulatory Visit: Payer: Self-pay

## 2021-08-06 DIAGNOSIS — I77819 Aortic ectasia, unspecified site: Secondary | ICD-10-CM

## 2021-08-09 NOTE — Progress Notes (Signed)
[facility]  [address] [place],  [place]  [address] [phone]  Clinic Day:  08/16/2021  Referring physician: Selena Batten *  This document serves as a record of services personally performed by Weston Settle, MD. It was created on their behalf by Curry,Lauren E, a trained medical scribe. The creation of this record is based on the scribe's personal observations and the provider's statements to them.  HISTORY OF PRESENT ILLNESS:  The patient is a 70 y.o. 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  In the past, IV Feraheme was effective in replenishing her iron stores and improving her hemoglobin.  She comes in today to reassess her anemia.  Since her last visit, the patient has been doing fairly well.  She denies having increased fatigue or any overt forms of blood loss which concern her for progressive anemia.   PHYSICAL EXAM:  Blood pressure 124/67, pulse 85, temperature 98.4 F (36.9 C), resp. rate 16, height 5\' 5"  (1.651 m), weight 198 lb 6.4 oz (90 kg), SpO2 100 %. Wt Readings from Last 3 Encounters:  08/16/21 198 lb 6.4 oz (90 kg)  07/16/21 196 lb (88.9 kg)  07/03/21 190 lb (86.2 kg)   Body mass index is 33.02 kg/m. Performance status (ECOG): 1 - Symptomatic but completely ambulatory Physical Exam Constitutional:      General: She is not in acute distress.    Appearance: Normal appearance. She is normal weight.  HENT:     Head: Normocephalic and atraumatic.  Eyes:     General: No scleral icterus.    Extraocular Movements: Extraocular movements intact.     Conjunctiva/sclera: Conjunctivae normal.     Pupils: Pupils are equal, round, and reactive to light.  Cardiovascular:     Rate and Rhythm: Normal rate and regular rhythm.     Pulses: Normal pulses.     Heart sounds: Normal heart sounds. No murmur heard.   No friction rub. No gallop.  Pulmonary:     Effort: Pulmonary effort  is normal. No respiratory distress.     Breath sounds: Normal breath sounds.  Abdominal:     General: Bowel sounds are normal. There is no distension.     Palpations: Abdomen is soft. There is no hepatomegaly, splenomegaly or mass.     Tenderness: There is no abdominal tenderness.  Musculoskeletal:        General: Normal range of motion.     Cervical back: Normal range of motion and neck supple.     Right lower leg: No edema.     Left lower leg: No edema.  Lymphadenopathy:     Cervical: No cervical adenopathy.  Skin:    General: Skin is warm and dry.  Neurological:     General: No focal deficit present.     Mental Status: She is alert and oriented to person, place, and time. Mental status is at baseline.  Psychiatric:        Mood and Affect: Mood normal.        Behavior: Behavior normal.        Thought Content: Thought content normal.        Judgment: Judgment normal.    LABS:   CBC Latest Ref Rng & Units 08/16/2021 02/14/2021 12/30/2020  WBC - 6.4 4.9 7.9  Hemoglobin 12.0 - 16.0 12.8 12.7 11.3(L)  Hematocrit 36 - 46 38 39 37.8  Platelets 150 - 399 250 234 313  CMP Latest Ref Rng & Units 08/16/2021 02/14/2021 01/16/2021  Glucose 65 - 99 mg/dL - - 85  BUN 4 - 21 18 11 18   Creatinine 0.5 - 1.1 1.2(A) 0.9 1.38(H)  Sodium 137 - 147 137 140 143  Potassium 3.4 - 5.3 4.1 3.9 4.4  Chloride 99 - 108 103 107 110(H)  CO2 13 - 22 24(A) 28(A) 19(L)  Calcium 8.7 - 10.7 9.1 8.7 8.8  Total Protein 6.5 - 8.1 g/dL - - -  Total Bilirubin 0.3 - 1.2 mg/dL - - -  Alkaline Phos 25 - 125 76 72 -  AST 13 - 35 26 31 -  ALT 7 - 35 13 15 -    Ref. Range 02/14/2021 14:32  Iron Latest Ref Range: 28 - 170 ug/dL 02/16/2021  UIBC Latest Units: ug/dL 826  TIBC Latest Ref Range: 250 - 450 ug/dL 415  Saturation Ratios Latest Ref Range: 10.4 - 31.8 % 45 (H)  Ferritin Latest Ref Range: 11 - 307 ng/mL 19   ASSESSMENT & PLAN:  Assessment/Plan:  A 70 y.o. female with anemia secondary to iron deficiency and  previous renal insufficiency.  I am pleased as her hemoglobin remains at an ideal level.  Her iron parameters also remain normal.  Clinically, she is doing very well.  As that is the case, I will see her back in 6 months for repeat clinical assessment.  The patient understands all the plans discussed today and is in agreement with them.    I, 61, am acting as scribe for Foye Deer, MD    I have reviewed this report as typed by the medical scribe, and it is complete and accurate.  Korissa Horsford Weston Settle, MD

## 2021-08-12 ENCOUNTER — Telehealth: Payer: Self-pay | Admitting: Oncology

## 2021-08-12 NOTE — Telephone Encounter (Signed)
Patient called to verify 9/16 Appt's

## 2021-08-16 ENCOUNTER — Inpatient Hospital Stay: Payer: Medicare Other | Attending: Oncology | Admitting: Oncology

## 2021-08-16 ENCOUNTER — Other Ambulatory Visit: Payer: Self-pay | Admitting: Oncology

## 2021-08-16 ENCOUNTER — Inpatient Hospital Stay: Payer: Medicare Other

## 2021-08-16 ENCOUNTER — Encounter: Payer: Self-pay | Admitting: Oncology

## 2021-08-16 VITALS — BP 124/67 | HR 85 | Temp 98.4°F | Resp 16 | Ht 65.0 in | Wt 198.4 lb

## 2021-08-16 DIAGNOSIS — N189 Chronic kidney disease, unspecified: Secondary | ICD-10-CM

## 2021-08-16 DIAGNOSIS — D508 Other iron deficiency anemias: Secondary | ICD-10-CM

## 2021-08-16 DIAGNOSIS — D631 Anemia in chronic kidney disease: Secondary | ICD-10-CM | POA: Diagnosis not present

## 2021-08-16 DIAGNOSIS — D509 Iron deficiency anemia, unspecified: Secondary | ICD-10-CM | POA: Insufficient documentation

## 2021-08-16 LAB — HEPATIC FUNCTION PANEL
ALT: 13 (ref 7–35)
AST: 26 (ref 13–35)
Alkaline Phosphatase: 76 (ref 25–125)
Bilirubin, Total: 0.3

## 2021-08-16 LAB — FERRITIN: Ferritin: 27 ng/mL (ref 11–307)

## 2021-08-16 LAB — BASIC METABOLIC PANEL
BUN: 18 (ref 4–21)
CO2: 24 — AB (ref 13–22)
Chloride: 103 (ref 99–108)
Creatinine: 1.2 — AB (ref 0.5–1.1)
Glucose: 283
Potassium: 4.1 (ref 3.4–5.3)
Sodium: 137 (ref 137–147)

## 2021-08-16 LAB — CBC AND DIFFERENTIAL
HCT: 38 (ref 36–46)
Hemoglobin: 12.8 (ref 12.0–16.0)
Neutrophils Absolute: 3.46
Platelets: 250 (ref 150–399)
WBC: 6.4

## 2021-08-16 LAB — COMPREHENSIVE METABOLIC PANEL
Albumin: 3.9 (ref 3.5–5.0)
Calcium: 9.1 (ref 8.7–10.7)

## 2021-08-16 LAB — IRON AND TIBC
Iron: 55 ug/dL (ref 28–170)
Saturation Ratios: 17 % (ref 10.4–31.8)
TIBC: 316 ug/dL (ref 250–450)
UIBC: 261 ug/dL

## 2021-08-16 LAB — CBC: RBC: 4.24 (ref 3.87–5.11)

## 2021-08-22 ENCOUNTER — Ambulatory Visit (HOSPITAL_COMMUNITY)
Admission: RE | Admit: 2021-08-22 | Payer: Medicare Other | Source: Ambulatory Visit | Attending: Cardiology | Admitting: Cardiology

## 2021-08-27 ENCOUNTER — Other Ambulatory Visit: Payer: Self-pay | Admitting: Cardiology

## 2021-08-31 IMAGING — CT CT ABD-PELV W/O CM
2 of 4 series · 16 of 46 positions shown, 18 images · non-contrast
Comparison: None.

CLINICAL DATA: Right upper quadrant pain.

EXAM:
CT ABDOMEN AND PELVIS WITHOUT CONTRAST
TECHNIQUE: Multidetector CT imaging of the abdomen and pelvis was performed
following the standard protocol without IV contrast.

[Series 2: axial st · axial · 0.97mm/px · z∈[-677,-242]mm · 13 of 101 slices shown, 15 images]
[im 7/101  soft-tissue]
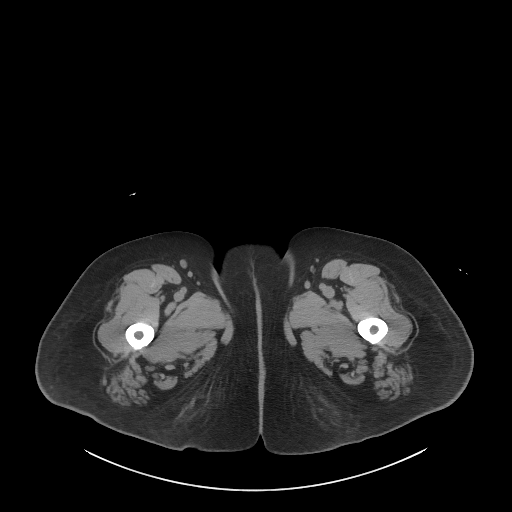
[im 7/101  bone]
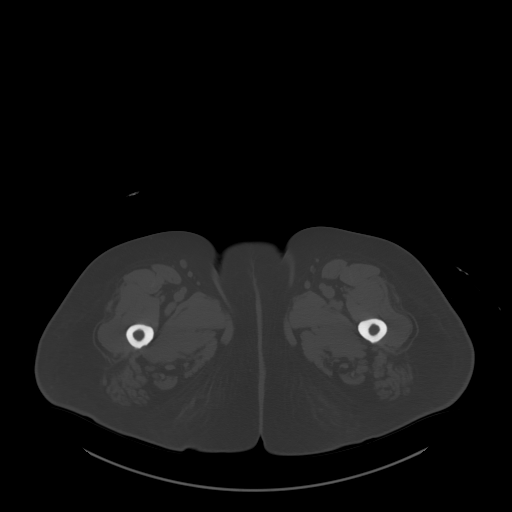
[im 13/101  soft-tissue]
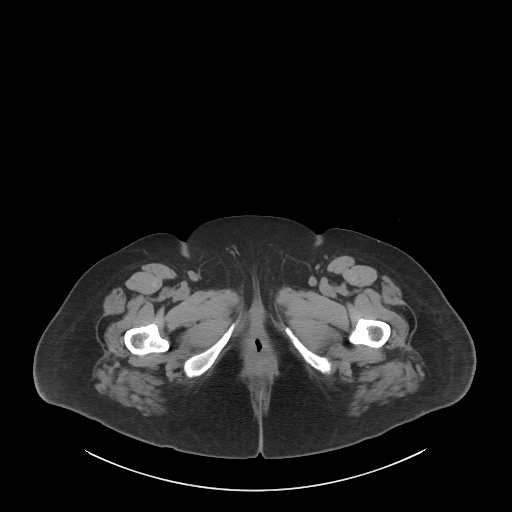
[im 19/101  soft-tissue]
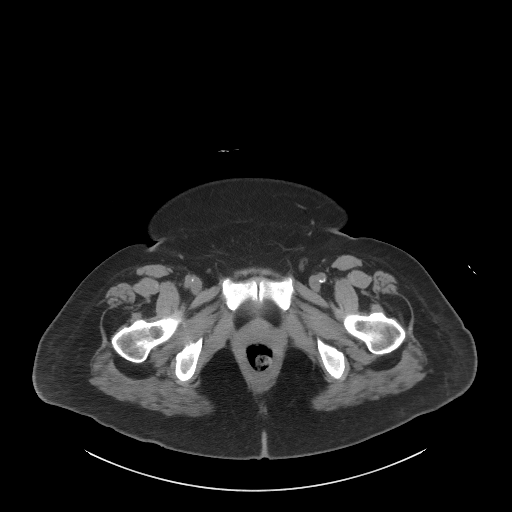
[im 32/101  soft-tissue]
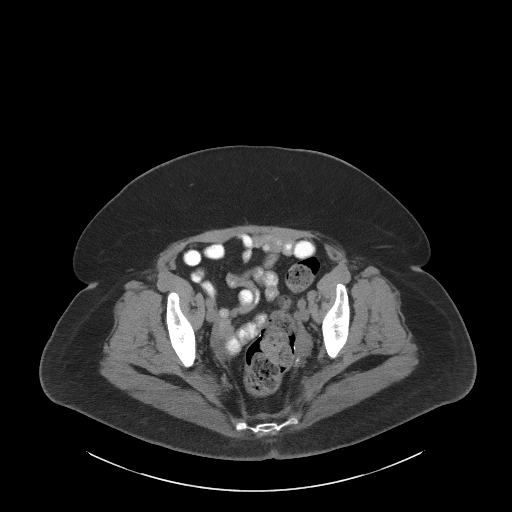
[im 38/101  soft-tissue]
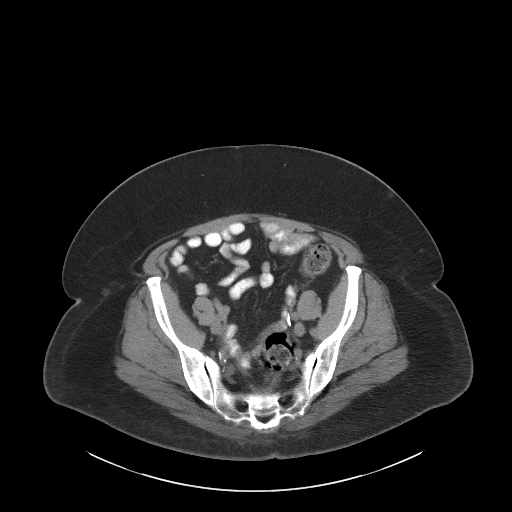
[im 44/101  soft-tissue]
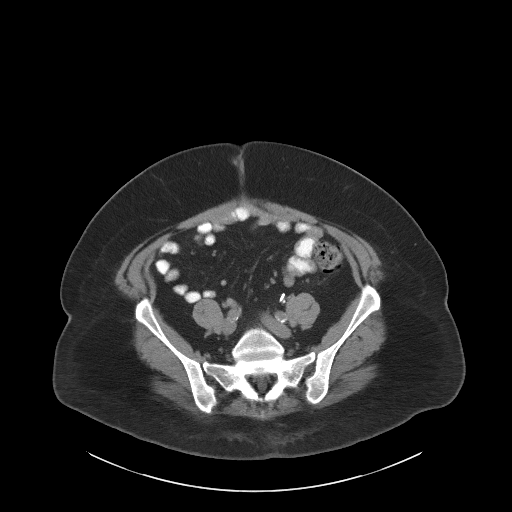
[im 51/101  soft-tissue]
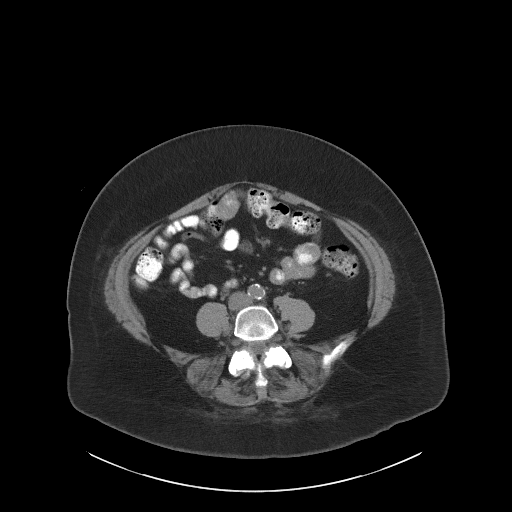
[im 57/101  soft-tissue]
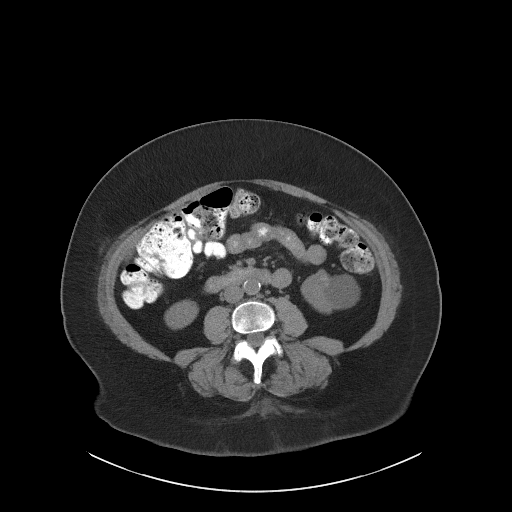
[im 63/101  soft-tissue]
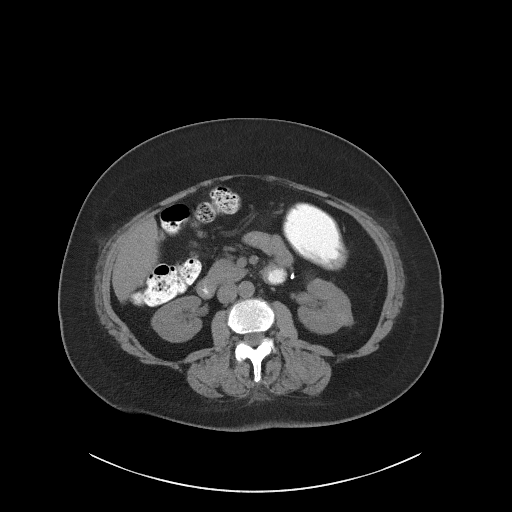
[im 63/101  bone]
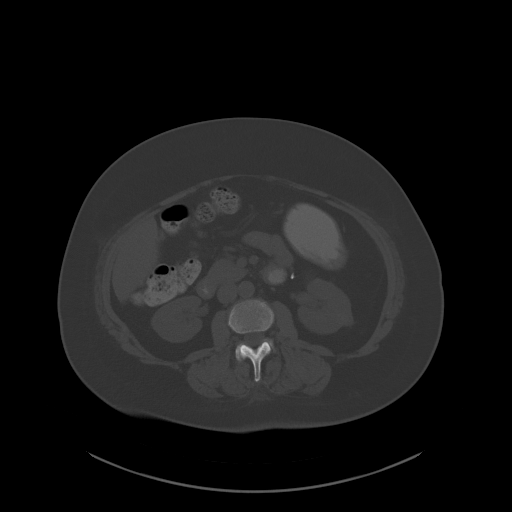
[im 69/101  soft-tissue]
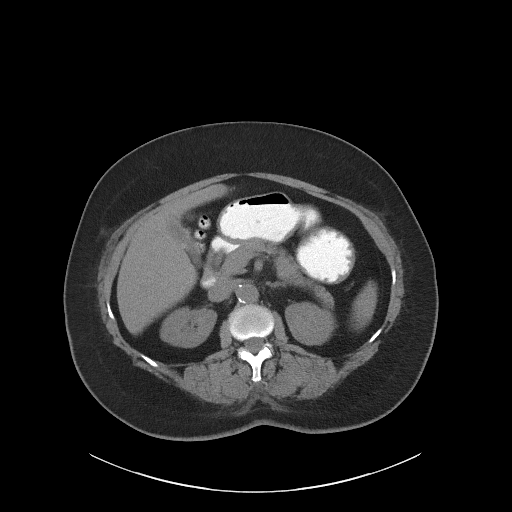
[im 82/101  soft-tissue]
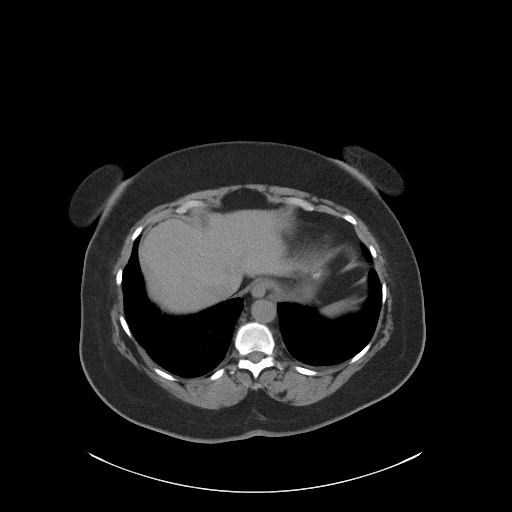
[im 88/101  soft-tissue]
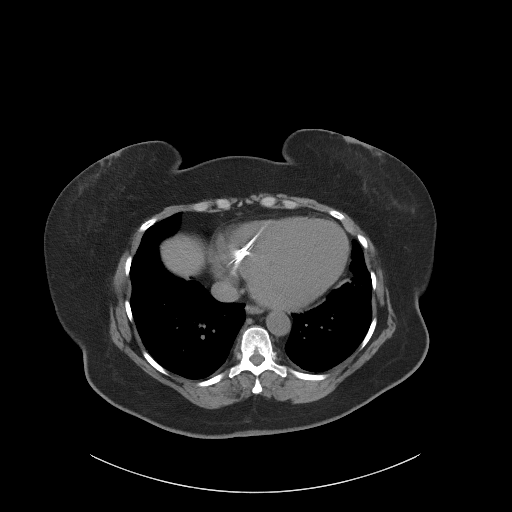
[im 94/101  soft-tissue]
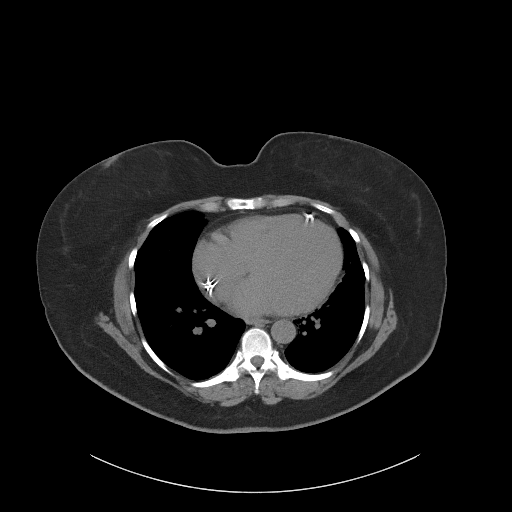

[Series 4: coronal st · coronal · 0.77mm/px · 3 of 117 slices shown]
[im 39/117  soft-tissue]
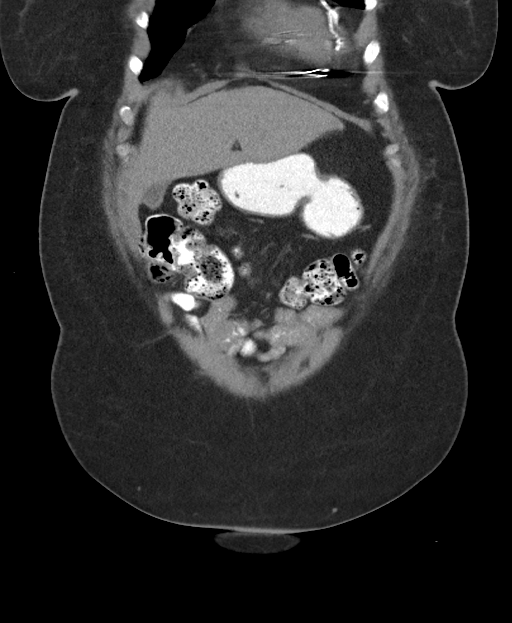
[im 52/117  soft-tissue]
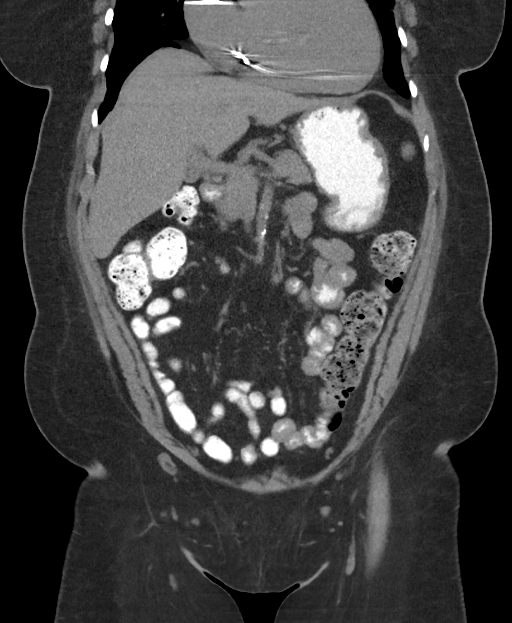
[im 65/117  soft-tissue]
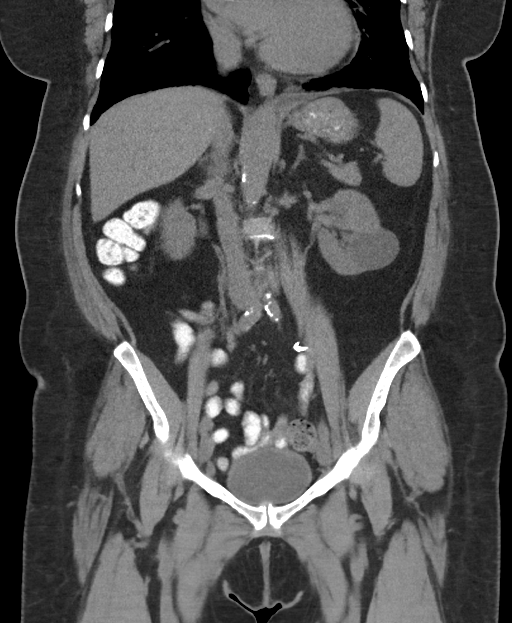

[16 of 46 positions shown; findings below may reference images not displayed]

FINDINGS: Lower chest: Mild basilar bronchiectasis and pulmonary parenchymal
scarring. Heart is enlarged. No pericardial or pleural effusion.
Distal esophagus is unremarkable.

Hepatobiliary: Liver and gallbladder No biliary ductal are
unremarkable dilatation.

Pancreas: Negative.

Spleen: Negative.

Adrenals/Urinary Tract: Adrenal glands and right kidney are
unremarkable. Low-attenuation lesions in the left kidney measure up
to 3.9 cm and are likely cysts. 11 mm hyperdense lesion in the lower
pole left kidney, too small to characterize. No urinary stones.
Ureters are decompressed. Bladder is grossly unremarkable.

Stomach/Bowel: Stomach and majority of the small bowel are
unremarkable. There may be a segment of small bowel wall thickening
in the right paramidline abdomen, just above the umbilicus. This
area is under distended, however. Remainder of the small bowel and
appendix are unremarkable. Stool is seen in the majority of the
colon, indicative of constipation. Anastomosis is seen in the
sigmoid. Colon is otherwise unremarkable.

Vascular/Lymphatic: Atherosclerotic calcification of the aorta. No
pathologically enlarged lymph nodes.

Reproductive: Hysterectomy.  No adnexal mass.

Other: No free fluid. Probable small area of fat necrosis adjacent
to the distal descending colon (2/62), possibly related to reported
history of diverticulitis. Small midline ventral hernia contains fat
with a wide neck. Mesenteries and peritoneum are otherwise
unremarkable.

Musculoskeletal: Degenerative changes in the spine.
IMPRESSION: 1. Possible segmental small bowel wall thickening in the ventral
right paramidline abdomen, just above the umbilicus. This area is
under distended, however. Otherwise, no findings to explain the
patient's right upper quadrant pain.
2. Stool in the majority of the colon is indicative of constipation.
3.  Aortic atherosclerosis ([id number]).

## 2021-09-05 ENCOUNTER — Ambulatory Visit (HOSPITAL_COMMUNITY)
Admission: RE | Admit: 2021-09-05 | Discharge: 2021-09-05 | Disposition: A | Discharge status: Home / Self Care | Payer: Medicare Other | Source: Ambulatory Visit | Attending: Cardiology | Admitting: Cardiology

## 2021-09-05 ENCOUNTER — Other Ambulatory Visit: Payer: Self-pay

## 2021-09-05 DIAGNOSIS — I700 Atherosclerosis of aorta: Secondary | ICD-10-CM

## 2021-09-05 DIAGNOSIS — I77819 Aortic ectasia, unspecified site: Secondary | ICD-10-CM | POA: Diagnosis present

## 2021-09-17 ENCOUNTER — Encounter: Payer: Self-pay | Admitting: Cardiology

## 2021-09-17 ENCOUNTER — Other Ambulatory Visit: Payer: Self-pay

## 2021-09-17 ENCOUNTER — Ambulatory Visit (INDEPENDENT_AMBULATORY_CARE_PROVIDER_SITE_OTHER): Payer: Medicare Other | Admitting: Cardiology

## 2021-09-17 VITALS — BP 122/70 | HR 74 | Ht 65.0 in | Wt 201.8 lb

## 2021-09-17 DIAGNOSIS — I2510 Atherosclerotic heart disease of native coronary artery without angina pectoris: Secondary | ICD-10-CM | POA: Diagnosis not present

## 2021-09-17 DIAGNOSIS — I110 Hypertensive heart disease with heart failure: Secondary | ICD-10-CM

## 2021-09-17 DIAGNOSIS — Z95810 Presence of automatic (implantable) cardiac defibrillator: Secondary | ICD-10-CM

## 2021-09-17 DIAGNOSIS — I519 Heart disease, unspecified: Secondary | ICD-10-CM

## 2021-09-17 DIAGNOSIS — I739 Peripheral vascular disease, unspecified: Secondary | ICD-10-CM | POA: Diagnosis not present

## 2021-09-17 DIAGNOSIS — I5020 Unspecified systolic (congestive) heart failure: Secondary | ICD-10-CM | POA: Diagnosis not present

## 2021-09-17 DIAGNOSIS — E6609 Other obesity due to excess calories: Secondary | ICD-10-CM

## 2021-09-17 DIAGNOSIS — Z6831 Body mass index (BMI) 31.0-31.9, adult: Secondary | ICD-10-CM

## 2021-09-17 DIAGNOSIS — E782 Mixed hyperlipidemia: Secondary | ICD-10-CM

## 2021-09-17 MED ORDER — VERQUVO 5 MG PO TABS: 5.0000 mg | ORAL_TABLET | Freq: Every day | ORAL | 0 refills | Status: DC

## 2021-09-17 MED ORDER — TORSEMIDE 20 MG PO TABS: ORAL_TABLET | ORAL | 3 refills | Status: DC

## 2021-09-17 NOTE — Patient Instructions (Signed)
Medication Instructions:  Your physician has recommended you make the following change in your medication:  CHANGE: Torsemide 20 mg twice on Tuesday and Thursday take once on other days.  *If you need a refill on your cardiac medications before your next appointment, please call your pharmacy*   Lab Work: Your physician recommends that you return for lab work in:  TODAY: BMET, Mag If you have labs (blood work) drawn today and your tests are completely normal, you will receive your results only by: MyChart Message (if you have MyChart) OR A paper copy in the mail If you have any lab test that is abnormal or we need to change your treatment, we will call you to review the results.   Testing/Procedures: None   Follow-Up: At [facility], you and your health needs are our priority.  As part of our continuing mission to provide you with exceptional heart care, we have created designated Provider Care Teams.  These Care Teams include your primary Cardiologist (physician) and Advanced Practice Providers (APPs -  Physician Assistants and Nurse Practitioners) who all work together to provide you with the care you need, when you need it.  We recommend signing up for the patient portal called "MyChart".  Sign up information is provided on this After Visit Summary.  MyChart is used to connect with patients for Virtual Visits (Telemedicine).  Patients are able to view lab/test results, encounter notes, upcoming appointments, etc.  Non-urgent messages can be sent to your provider as well.   To learn more about what you can do with MyChart, go to [email or web address].    Your next appointment:   6 month(s)  The format for your next appointment:   In Person  Provider:   Thomasene Ripple, DO [id number], [place], [place] [address]    Other Instructions

## 2021-09-17 NOTE — Progress Notes (Signed)
Cardiology Office Note:    Date:  09/17/2021   ID:  [name], DOB [birth year], MRN [id number]  PCP:  Hadley Pen, MD  Cardiologist:  Thomasene Ripple, DO  Electrophysiologist:  None   Referring MD: Hadley Pen, MD     History of Present Illness:    Karen Warner is a 70 y.o. female with a hx of coronary artery disease status post PCI, ischemic cardiomyopathy status post BiV pacemaker during her hospitalization in January 2022 presented for syncope episode, heart failure reduced ejection fraction recent EF 20 to 25% on her echo which was done in January 2022, sleep apnea is here today for follow-up visit.   I saw the patient most recently on January 16, 2021 at that time she had had a fall and was concerned about information on her device.  We reviewed her device check which was normal.  She did have some bilateral leg edema but was still at her dry weight.  Therefore her diuretics was not changed.   At her visit on January 16, 2021 we continued her torsemide which was every other day.  In terms of her ischemic cardiomyopathy we kept the Entresto, Coreg with no changes.  She was off Aldactone but her blood pressure did not tolerate starting this medication.    On March 15, 2021 she appeared to be doing well from a cardiovascular standpoint.  She was still waiting for her CPAP titration study.  I saw the patient on July 16, 2021 at that time she was post hospitalization from [facility] for heart failure exacerbation.  During that visit I started the patient on Verquvo 2.5mg  daily with the intention to titrate up to the maximum tolerable dose.  Continue her other guideline medical therapy.  Therapy. Since I saw the patient she has been doing well.  Offers no specific complaints.  She had an questionable dilated abdominal aorta therefore a dedicated ultrasound of the liver was done with concern for any aneurysm.  She is planning to travel to [place] with friends.  She is currently distraught.  She also has questions about some diets program which she would like to try.  Past Medical History:  Diagnosis Date   Anemia 12/12/2019   Arthralgia 01/14/2020   Arthritis    Asthma    Atherosclerotic heart disease of native coronary artery without angina pectoris 04/13/2014   CHF (congestive heart failure) (HCC)    Chronic bilateral low back pain with bilateral sciatica 01/13/2019   Chronic bladder pain 11/08/2014   Last Assessment & Plan:  Formatting of this note might be different from the original. Patient has chronic pain syndrome in general I think this is unfortunately worsening her pelvic pain and bladder pain her examination today was very reassuring I am advised her to use the estrogen cream I did start her on Elavil 10 milligrams at that time if she does tolerated will increase it to 25 milligrams.    Chronic headaches    Chronic idiopathic constipation 12/22/2014   Formatting of this note might be different from the original. Last Assessment & Plan:  For better bowel emptying please use either Citrucel / Benefiber start with 2 tablespoons daily and titrate up or down to effect.  Also please use glycerin suppositories as needed to assist in evacuation. Last Assessment & Plan:  Formatting of this note might be different from the original. For better bowel empt   Chronic obstructive pulmonary disease, unspecified (HCC)  03/18/2018   Class 1 obesity due to excess calories with serious comorbidity and body mass index (BMI) of 31.0 to 31.9 in adult 06/26/2020   Colon polyps    COPD (chronic obstructive pulmonary disease) (HCC)    Coronary artery disease    DDD (degenerative disc disease), cervical 01/13/2019   Depression 11/05/2019   Diabetic polyneuropathy associated with type 2 diabetes mellitus (HCC) 02/24/2019   Diverticulitis 05/20/2018   Diverticulitis of colon 03/18/2018   Family history of ischemic heart disease (IHD) 08/16/2013    Fibromyalgia affecting multiple sites 01/14/2020   Gastro-esophageal reflux disease without esophagitis 01/13/2019   Glaucoma 11/08/2014   Hordeolum externum of left upper eyelid 03/13/2020   Hypertension 11/08/2014   IBS (irritable bowel syndrome)    Iron deficiency anemia 01/13/2019   Left renal mass 11/08/2019   Leukocytosis 05/28/2018   Low vitamin B12 level 03/13/2020   Low vitamin D level 12/12/2019   LV dysfunction 05/31/2019   Malignant essential hypertension 01/22/2016   Migraine, unspecified, not intractable, without status migrainosus 11/08/2014   Mixed hyperlipidemia 01/13/2019   Myocardial infarction (HCC)    Other premature beats 11/08/2014   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 Last Assessment & Plan:  For vaginal atrophy please place a pea size dab of Estrogen vaginal cream  into the vagina 3 times a week ( Monday, Wednesday, Friday) Last Assessment & Plan:  Formatting of this note might be different from the original. For vaginal atrophy please place a pea size dab of Estrogen vaginal cream  into the vagina     Past Surgical History:  Procedure Laterality Date   ABDOMINAL HYSTERECTOMY     BIV ICD INSERTION CRT-D N/A 12/31/2020   Procedure: BIV ICD INSERTION CRT-D;  Surgeon: Marinus Maw, MD;  Location[facility] INVASIVE CV LAB;  Service: Cardiovascular;  Laterality: N/A;   BLEPHAROPLASTY Bilateral    COLON SURGERY     CORONARY ANGIOPLASTY WITH STENT PLACEMENT  2020   X3   CORONARY STENT INTERVENTION N/A 08/03/2020   Procedure: CORONARY STENT INTERVENTION;  Surgeon: Tonny Bollman, MD;  Location: [facility] INVASIVE CV LAB;  Service: Cardiovascular;  Laterality: N/A;    INTRAVASCULAR PRESSURE WIRE/FFR STUDY N/A 08/03/2020   Procedure: INTRAVASCULAR PRESSURE WIRE/FFR STUDY;  Surgeon: Tonny Bollman, MD;  Location: [facility] INVASIVE CV LAB;  Service: Cardiovascular;  Laterality: N/A;   LEFT HEART CATH AND CORONARY ANGIOGRAPHY N/A 08/03/2020   Procedure: LEFT HEART CATH AND CORONARY ANGIOGRAPHY;  Surgeon: Tonny Bollman, MD;  Location: [facility] INVASIVE CV LAB;  Service: Cardiovascular;  Laterality: N/A;   REFRACTIVE SURGERY Left    [place]    Current Medications: Current Meds  Medication Sig   acetaminophen (TYLENOL) 325 MG tablet Take 2 tablets by mouth every 6 (six) hours as needed for pain.   ACETAMINOPHEN-BUTALBITAL 50-325 MG TABS Take 1 tablet by mouth every 4 (four) hours as needed for severe pain.   albuterol (PROVENTIL) (2.5 MG/3ML) 0.083% nebulizer solution INL CONTENTS OF 1 VIAL VIA NEBULIZER Q 4 H PRF WHEEZING OR COUGH   albuterol (VENTOLIN HFA) 108 (90 Base) MCG/ACT inhaler Inhale 2 puffs into the lungs every 6 (six) hours as needed for shortness  of breath or wheezing.   aspirin 81 MG EC tablet 81 mg daily.   baclofen (LIORESAL) 10 MG tablet Take 10 mg by mouth 2 (two) times daily.   carvedilol (COREG) 3.125 MG tablet TAKE 1 TABLET(3.125 MG) BY MOUTH TWICE DAILY WITH A MEAL   clopidogrel (PLAVIX) 75 MG tablet Take 1 tablet (75 mg total) by mouth daily.   dapagliflozin propanediol (FARXIGA) 10 MG TABS tablet Take 1 tablet (10 mg total) by mouth daily before breakfast.   diclofenac Sodium (VOLTAREN) 1 % GEL Apply 2 g topically 2 (two) times daily as needed (pain).   dicyclomine (BENTYL) 10 MG capsule Take 10 mg by mouth every 6 (six) hours as needed for spasms.   ENTRESTO 24-26 MG TAKE 1 TABLET BY MOUTH TWICE DAILY   famotidine (PEPCID) 40 MG tablet Take 40 mg by mouth 3 (three) times daily.   ferrous sulfate 324 MG TBEC Take 324 mg by mouth daily with breakfast.   fluticasone (FLONASE) 50 MCG/ACT nasal spray Place 1 spray into both nostrils as needed for  allergies or rhinitis.   folic acid (FOLVITE) 1 MG tablet Take 1 mg by mouth daily.   insulin lispro (HUMALOG) 100 UNIT/ML KwikPen Inject 16 Units into the skin 3 (three) times daily. Administer 16 units three times daily before a meal per sliding scale   lidocaine (LIDODERM) 5 % Place 1 patch onto the skin daily. Remove & Discard patch within 12 hours or as directed by MD   loratadine (CLARITIN) 10 MG tablet Take 10 mg by mouth daily.   nitroGLYCERIN (NITROSTAT) 0.4 MG SL tablet Place 1 tablet (0.4 mg total) under the tongue every 5 (five) minutes as needed for chest pain.   nystatin (MYCOSTATIN) 100000 UNIT/ML suspension Take 5 mLs by mouth as needed.   pantoprazole (PROTONIX) 40 MG tablet TAKE 1 TABLET(40 MG) BY MOUTH DAILY   potassium chloride (KLOR-CON) 10 MEQ tablet Take 10 mEq by mouth daily.   pregabalin (LYRICA) 50 MG capsule Take 50 mg by mouth 3 (three) times daily.   rosuvastatin (CRESTOR) 40 MG tablet Take 40 mg by mouth daily.   spironolactone (ALDACTONE) 25 MG tablet Take 0.5 tablets (12.5 mg total) by mouth daily.   torsemide (DEMADEX) 20 MG tablet Take twice on Tuesday and Thursday take once on other days   TRESIBA FLEXTOUCH 200 UNIT/ML FlexTouch Pen Inject 40 Units into the skin 2 (two) times daily. 40 units in the morning and up to 45 units at night   TRINTELLIX 5 MG TABS tablet Take 5 mg by mouth at bedtime.   valACYclovir (VALTREX) 1000 MG tablet Take 1,000 mg by mouth 2 (two) times daily as needed (flares).   vitamin B-12 (CYANOCOBALAMIN) 1000 MCG tablet Take 1,000 mcg by mouth daily.   Vitamin D, Ergocalciferol, (DRISDOL) 1.25 MG (50000 UNIT) CAPS capsule Take 50,000 Units by mouth once a week. sundays   [DISCONTINUED] torsemide (DEMADEX) 20 MG tablet Take 20 mg by mouth daily.   [DISCONTINUED] Vericiguat (VERQUVO) 2.5 MG TABS Take 2.5 mg by mouth daily.     Allergies:   Bee venom, Sumatriptan, Tramadol, Amoxicillin-pot clavulanate, Oxycodone, Buprenorphine hcl,  Clarithromycin, Duloxetine hcl, Hydrocodone, Lactose, Liraglutide, and Tramadol hcl   Social History   Socioeconomic History   Marital status: Significant Other    Spouse name: Not on file   Number of children: 3   Years of education: Not on file   Highest education level: Not on file  Occupational History  Occupation: retired  Tobacco Use   Smoking status: Former    Types: Cigarettes    Quit date: 2021    Years since quitting: 1.7   Smokeless tobacco: Never  Vaping Use   Vaping Use: Never used  Substance and Sexual Activity   Alcohol use: Never   Drug use: Never   Sexual activity: Not on file  Other Topics Concern   Not on file  Social History Narrative   Not on file   Social Determinants of Health   Financial Resource Strain: Not on file  Food Insecurity: Not on file  Transportation Needs: Not on file  Physical Activity: Not on file  Stress: Not on file  Social Connections: Not on file     Family History: The patient's family history includes Colon polyps in her mother; Coronary artery disease in her mother; Diabetes in her daughter, maternal grandmother, and mother; Glaucoma in her mother; Heart disease in her father; Hypertension in her brother and mother; Lung cancer in her mother; Migraines in her father and son.  ROS:   Review of Systems  Constitution: Negative for decreased appetite, fever and weight gain.  HENT: Negative for congestion, ear discharge, hoarse voice and sore throat.   Eyes: Negative for discharge, redness, vision loss in right eye and visual halos.  Cardiovascular: Negative for chest pain, dyspnea on exertion, leg swelling, orthopnea and palpitations.  Respiratory: Negative for cough, hemoptysis, shortness of breath and snoring.   Endocrine: Negative for heat intolerance and polyphagia.  Hematologic/Lymphatic: Negative for bleeding problem. Does not bruise/bleed easily.  Skin: Negative for flushing, nail changes, rash and suspicious lesions.   Musculoskeletal: Negative for arthritis, joint pain, muscle cramps, myalgias, neck pain and stiffness.  Gastrointestinal: Negative for abdominal pain, bowel incontinence, diarrhea and excessive appetite.  Genitourinary: Negative for decreased libido, genital sores and incomplete emptying.  Neurological: Negative for brief paralysis, focal weakness, headaches and loss of balance.  Psychiatric/Behavioral: Negative for altered mental status, depression and suicidal ideas.  Allergic/Immunologic: Negative for HIV exposure and persistent infections.    EKGs/Labs/Other Studies Reviewed:    The following studies were reviewed today:   EKG: None today  Recent Labs: 12/28/2020: TSH 1.606 01/16/2021: Magnesium 2.2 08/16/2021: ALT 13; BUN 18; Creatinine 1.2; Hemoglobin 12.8; Platelets 250; Potassium 4.1; Sodium 137  Recent Lipid Panel No results found for: CHOL, TRIG, HDL, CHOLHDL, VLDL, LDLCALC, LDLDIRECT  Physical Exam:    VS:  BP 122/70   Pulse 74   Ht 5\' 5"  (1.651 m)   Wt 201 lb 12.8 oz (91.5 kg)   SpO2 98%   BMI 33.58 kg/m     Wt Readings from Last 3 Encounters:  09/17/21 201 lb 12.8 oz (91.5 kg)  08/16/21 198 lb 6.4 oz (90 kg)  07/16/21 196 lb (88.9 kg)     GEN: Well nourished, well developed in no acute distress HEENT: Normal NECK: No JVD; No carotid bruits LYMPHATICS: No lymphadenopathy CARDIAC: S1S2 noted,RRR, no murmurs, rubs, gallops RESPIRATORY:  Clear to auscultation without rales, wheezing or rhonchi  ABDOMEN: Soft, non-tender, non-distended, +bowel sounds, no guarding. EXTREMITIES: +1 bilateral leg edema, No cyanosis, no clubbing MUSCULOSKELETAL:  No deformity  SKIN: Warm and dry NEUROLOGIC:  Alert and oriented x 3, non-focal PSYCHIATRIC:  Normal affect, good insight  ASSESSMENT:    1. Heart failure with reduced ejection fraction (HCC)   2. Atherosclerosis of native coronary artery of native heart without angina pectoris   3. Left ventricular dysfunction   4.  PVD (peripheral vascular disease) (HCC)   5. Hypertensive heart disease with heart failure (HCC)   6. Class 1 obesity due to excess calories with serious comorbidity and body mass index (BMI) of 31.0 to 31.9 in adult   7. Mixed hyperlipidemia   8. Biventricular ICD (implantable cardioverter-defibrillator) in place    PLAN:    She does have some increasing leg edema.  The plan will be to adjust her diuretic dosing.  Tuesdays and Thursdays she will take 20 mg twice a day of her torsemide on other 20 mg daily.  We will get blood work today.  In addition she has tolerated the Verquvo 2.5mg  daily and plan to increase this medicine to 5 mg daily.  We also will help get information for financial assistance for the patient hopefully she will qualify for this.  The patient understands the need to lose weight with diet and exercise. We have discussed specific strategies for this.  She is planning on doing a super beets diet program.  No anginal symptoms, continue her current medication regimen.  The patient is in agreement with the above plan. The patient left the office in stable condition.  The patient will follow up in 6 months.   Medication Adjustments/Labs and Tests Ordered: Current medicines are reviewed at length with the patient today.  Concerns regarding medicines are outlined above.  Orders Placed This Encounter  Procedures   Basic metabolic panel   Magnesium   Meds ordered this encounter  Medications   torsemide (DEMADEX) 20 MG tablet    Sig: Take twice on Tuesday and Thursday take once on other days    Dispense:  180 tablet    Refill:  3    Patient Instructions  Medication Instructions:  Your physician has recommended you make the following change in your medication:  CHANGE: Torsemide 20 mg twice on Tuesday and Thursday take once on other days.  *If you need a refill on your cardiac medications before your next appointment, please call your pharmacy*   Lab Work: Your  physician recommends that you return for lab work in:  TODAY: BMET, Mag If you have labs (blood work) drawn today and your tests are completely normal, you will receive your results only by: MyChart Message (if you have MyChart) OR A paper copy in the mail If you have any lab test that is abnormal or we need to change your treatment, we will call you to review the results.   Testing/Procedures: None   Follow-Up: At [facility], you and your health needs are our priority.  As part of our continuing mission to provide you with exceptional heart care, we have created designated Provider Care Teams.  These Care Teams include your primary Cardiologist (physician) and Advanced Practice Providers (APPs -  Physician Assistants and Nurse Practitioners) who all work together to provide you with the care you need, when you need it.  We recommend signing up for the patient portal called "MyChart".  Sign up information is provided on this After Visit Summary.  MyChart is used to connect with patients for Virtual Visits (Telemedicine).  Patients are able to view lab/test results, encounter notes, upcoming appointments, etc.  Non-urgent messages can be sent to your provider as well.   To learn more about what you can do with MyChart, go to [email or web address].    Your next appointment:   6 month(s)  The format for your next appointment:   In Person  Provider:   Thomasene Ripple, DO  [address] [place], [place], [place] [address]    Other Instructions     Adopting a Healthy Lifestyle.  Know what a healthy weight is for you (roughly BMI <25) and aim to maintain this   Aim for 7+ servings of fruits and vegetables daily   65-80+ fluid ounces of water or unsweet tea for healthy kidneys   Limit to max 1 drink of alcohol per day; avoid smoking/tobacco   Limit animal fats in diet for cholesterol and heart health - choose grass fed whenever available   Avoid highly processed foods, and foods  high in saturated/trans fats   Aim for low stress - take time to unwind and care for your mental health   Aim for 150 min of moderate intensity exercise weekly for heart health, and weights twice weekly for bone health   Aim for 7-9 hours of sleep daily   When it comes to diets, agreement about the perfect plan isnt easy to find, even among the experts. Experts at the [facility] [employer] developed an idea known as the Healthy Eating Plate. Just imagine a plate divided into logical, healthy portions.   The emphasis is on diet quality:   Load up on vegetables and fruits - one-half of your plate: Aim for color and variety, and remember that potatoes dont count.   Go for whole grains - one-quarter of your plate: Whole wheat, barley, wheat berries, quinoa, oats, brown rice, and foods made with them. If you want pasta, go with whole wheat pasta.   Protein power - one-quarter of your plate: Fish, chicken, beans, and nuts are all healthy, versatile protein sources. Limit red meat.   The diet, however, does go beyond the plate, offering a few other suggestions.   Use healthy plant oils, such as olive, canola, soy, corn, sunflower and peanut. Check the labels, and avoid partially hydrogenated oil, which have unhealthy trans fats.   If youre thirsty, drink water. Coffee and tea are good in moderation, but skip sugary drinks and limit milk and dairy products to one or two daily servings.   The type of carbohydrate in the diet is more important than the amount. Some sources of carbohydrates, such as vegetables, fruits, whole grains, and beans-are healthier than others.   Finally, stay active  Signed, Thomasene Ripple, DO  09/17/2021 2:51 PM    [facility]

## 2021-09-18 LAB — BASIC METABOLIC PANEL
BUN/Creatinine Ratio: 14 (ref 12–28)
BUN: 15 mg/dL (ref 8–27)
CO2: 23 mmol/L (ref 20–29)
Calcium: 9.7 mg/dL (ref 8.7–10.3)
Chloride: 105 mmol/L (ref 96–106)
Creatinine, Ser: 1.1 mg/dL — ABNORMAL HIGH (ref 0.57–1.00)
Glucose: 132 mg/dL — ABNORMAL HIGH (ref 70–99)
Potassium: 4.6 mmol/L (ref 3.5–5.2)
Sodium: 144 mmol/L (ref 134–144)
eGFR: 54 mL/min/{1.73_m2} — ABNORMAL LOW (ref >59)

## 2021-09-18 LAB — MAGNESIUM: Magnesium: 2.4 mg/dL — ABNORMAL HIGH (ref 1.6–2.3)

## 2021-10-04 ENCOUNTER — Ambulatory Visit (INDEPENDENT_AMBULATORY_CARE_PROVIDER_SITE_OTHER): Payer: Medicare Other

## 2021-10-04 DIAGNOSIS — I255 Ischemic cardiomyopathy: Secondary | ICD-10-CM

## 2021-10-04 LAB — CUP PACEART REMOTE DEVICE CHECK
Battery Remaining Longevity: 114 mo
Battery Remaining Percentage: 100 %
Brady Statistic RA Percent Paced: 0 %
Brady Statistic RV Percent Paced: 100 %
Date Time Interrogation Session: 20221104031100
HighPow Impedance: 81 Ohm
Implantable Lead Implant Date: 20220131
Implantable Lead Implant Date: 20220131
Implantable Lead Implant Date: 20220131
Implantable Lead Location: 753858
Implantable Lead Location: 753859
Implantable Lead Location: 753860
Implantable Lead Model: 137
Implantable Lead Model: 3830
Implantable Lead Model: 7840
Implantable Lead Serial Number: 1047234
Implantable Lead Serial Number: 301179
Implantable Pulse Generator Implant Date: 20220131
Lead Channel Impedance Value: 565 Ohm
Lead Channel Impedance Value: 584 Ohm
Lead Channel Impedance Value: 797 Ohm
Lead Channel Setting Pacing Amplitude: 0.1 V
Lead Channel Setting Pacing Amplitude: 3.5 V
Lead Channel Setting Pacing Amplitude: 3.5 V
Lead Channel Setting Pacing Pulse Width: 0.1 ms
Lead Channel Setting Pacing Pulse Width: 0.4 ms
Lead Channel Setting Sensing Sensitivity: 0.5 mV
Lead Channel Setting Sensing Sensitivity: 1 mV
Pulse Gen Serial Number: 149649

## 2021-10-09 NOTE — Progress Notes (Signed)
Remote ICD transmission.   

## 2021-10-17 ENCOUNTER — Other Ambulatory Visit: Payer: Self-pay | Admitting: Rehabilitation

## 2021-10-17 DIAGNOSIS — M5416 Radiculopathy, lumbar region: Secondary | ICD-10-CM

## 2021-11-08 ENCOUNTER — Telehealth: Payer: Self-pay | Admitting: Cardiology

## 2021-11-08 NOTE — Telephone Encounter (Signed)
   Pt would like to speak with [facility] regarding her pt assistance form, she said she filled up the paperwork and wanted to know what is the next step

## 2021-11-11 NOTE — Telephone Encounter (Signed)
Called pt, she is made aware she can bring the paperwork to the office and we will fax it off or her. She verbalized understanding and will bring the paperwork this week.

## 2021-11-16 ENCOUNTER — Other Ambulatory Visit: Payer: Self-pay | Admitting: Cardiology

## 2021-11-21 ENCOUNTER — Telehealth: Payer: Self-pay

## 2021-11-21 NOTE — Telephone Encounter (Signed)
Called pt to let her know she needs to sign a couple pages of the [facility] application. No answer at this time, left a message for her to return the call.

## 2021-11-22 ENCOUNTER — Telehealth: Payer: Self-pay

## 2021-11-22 ENCOUNTER — Other Ambulatory Visit: Payer: Self-pay | Admitting: Cardiology

## 2021-11-22 NOTE — Telephone Encounter (Signed)
Ok to give Eliquis 5 mg samples  °

## 2021-11-22 NOTE — Telephone Encounter (Signed)
No Eliquis 5 mg samples in the closet.

## 2021-11-22 NOTE — Telephone Encounter (Signed)
Called pt to go over her paperwork. She is aware she needs to come sign a couple pages of her application. She states she will cone next week to sign the paperwork. She also states she is out of the Eliquis and wants to know can she have some samples. Will get message over to pharmacy to determine that.

## 2021-11-29 NOTE — Telephone Encounter (Signed)
Patient is following up. She states she would like to let [place], RN know that this week she caught a cold and she has been experiencing terrible sinus symptoms. She plans to come in next week to complete paperwork and for Eliquis Samples if they are still available. She would like to know if there is a certain day next week that [place], [place] would prefer. Please advise when able. Thank you.

## 2021-11-29 NOTE — Telephone Encounter (Signed)
Called patient she is aware he samples are still at the front and I have her paperwork. She will come next week sometime to sign the paperwork and get the samples.

## 2021-12-01 ENCOUNTER — Other Ambulatory Visit: Payer: Self-pay | Admitting: Cardiology

## 2021-12-04 NOTE — Telephone Encounter (Signed)
Pt is calling to advise that she picked up Samples yesterday and it was the wrong meds. Pt said she picked up Eliquis instead of Verquvo

## 2021-12-04 NOTE — Telephone Encounter (Signed)
Called pt and apologized for the mix up. She is made aware the correct samples will be at the front desk for her. She verbalized understanding and will bring the Eliquis samples back to the office.

## 2021-12-17 ENCOUNTER — Encounter: Payer: Self-pay | Admitting: Nurse Practitioner

## 2021-12-17 ENCOUNTER — Ambulatory Visit (INDEPENDENT_AMBULATORY_CARE_PROVIDER_SITE_OTHER): Payer: Medicare Other | Admitting: Nurse Practitioner

## 2021-12-17 VITALS — BP 80/50 | HR 88 | Ht 63.5 in | Wt 197.5 lb

## 2021-12-17 DIAGNOSIS — R1031 Right lower quadrant pain: Secondary | ICD-10-CM

## 2021-12-17 DIAGNOSIS — R143 Flatulence: Secondary | ICD-10-CM | POA: Diagnosis not present

## 2021-12-17 DIAGNOSIS — K648 Other hemorrhoids: Secondary | ICD-10-CM

## 2021-12-17 DIAGNOSIS — R159 Full incontinence of feces: Secondary | ICD-10-CM | POA: Diagnosis not present

## 2021-12-17 MED ORDER — HYDROCORTISONE ACETATE 25 MG RE SUPP: 25.0000 mg | Freq: Every evening | RECTAL | 0 refills | Status: AC

## 2021-12-17 MED ORDER — DICYCLOMINE HCL 10 MG PO CAPS: 10.0000 mg | ORAL_CAPSULE | Freq: Two times a day (BID) | ORAL | 0 refills | Status: DC

## 2021-12-17 NOTE — Progress Notes (Signed)
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 S/P left hemicolectomy secondary to diverticular disease 05/2016 and colon polyps. She is followed by Dr. Lavon Paganini.  She presents her office today for further evaluation regarding increased flatulence and she often passes gas per the rectum and stool leaks out which is undesirable.  She is passing a normal formed bowel movement twice daily, her stools are formed but soft and require a lot of wiping.  She sometimes sees a small amount of bright red blood on the toilet tissue after wiping.  No anorectal pain.  No black stools.  She stopped taking a probiotic a few weeks ago without any noticeable change in her symptoms.  No recent antibiotics.  She eats ice cream every other day and takes Lactaid with each dairy product.  She has chronic RLQ pain which comes and goes.  She takes Dicyclomine as needed which relieves this pain.  CTAP without contrast to evaluate her RLQ pain was done 06/12/2021 which showed possible segmental small bowel wall thickening in the ventral right paramidline abdomen just above the umbilicus and stool was noted throughout the majority of the colon.  Her most recent colonoscopy was 10/07/2019, see results below.  Colonoscopy October 07, 2019: 3 diminutive tubular adenomas removed, few scattered diverticula proximal to the anastomosis, left hemicolectomy for diverticular disease. Recall colonoscopy 5 years recommended  Colonoscopy October 15, 2018 showed evidence of left hemicolectomy with anastomosis 2 cm from the anal verge.  Diverticulosis.  EGD October 25, 2018  showed gastritis, hiatal hernia.  Gastric biopsies negative for H. Pylori  Small bowel video capsule October 07, 2018: AVM of the proximal jejunum, nonbleeding.  Labs 11/07/2021: WBC 5.8.  Hemoglobin 13.1.  Retacrit 39.3.  Platelet 283.  Current Outpatient Medications on File Prior to Visit  Medication Sig Dispense Refill   acetaminophen (TYLENOL) 325 MG tablet Take 2 tablets by mouth every 6 (six) hours as needed for pain.     ACETAMINOPHEN-BUTALBITAL 50-325 MG TABS Take 1 tablet by mouth every 4 (four) hours as needed for severe pain.     albuterol (PROVENTIL) (2.5 MG/3ML) 0.083% nebulizer solution INL CONTENTS OF 1 VIAL VIA NEBULIZER Q 4 H PRF WHEEZING OR COUGH     albuterol (VENTOLIN HFA) 108 (90 Base) MCG/ACT inhaler Inhale 2 puffs into the lungs every 6 (six) hours as needed for shortness of breath or wheezing.     aspirin 81 MG EC tablet 81 mg daily.     baclofen (LIORESAL) 10 MG tablet Take 10 mg by mouth 2 (two) times daily.     carvedilol (COREG) 3.125 MG tablet TAKE 1 TABLET(3.125 MG) BY MOUTH TWICE DAILY WITH A MEAL 180 tablet 2   clopidogrel (PLAVIX) 75 MG tablet Take 1 tablet (75 mg total) by mouth daily. 90 tablet 3   diclofenac Sodium (VOLTAREN) 1 % GEL Apply 2 g topically 2 (two) times daily as needed (pain).     famotidine (PEPCID) 40 MG tablet Take 40 mg by mouth 3 (three) times daily.     FARXIGA 10 MG TABS tablet TAKE 1 TABLET(10 MG) BY MOUTH DAILY BEFORE BREAKFAST 30 tablet 12  ferrous sulfate 324 MG TBEC Take 324 mg by mouth daily with breakfast.     fluticasone (FLONASE) 50 MCG/ACT nasal spray Place 1 spray into both nostrils as needed for allergies or rhinitis.     folic acid (FOLVITE) 1 MG tablet Take 1 mg by mouth daily.     insulin lispro (HUMALOG) 100 UNIT/ML KwikPen Inject 16 Units into the skin 3 (three) times daily. Administer 16 units three times daily before a meal per sliding scale     lidocaine (LIDODERM) 5 % Place 1 patch onto the skin daily. Remove &  Discard patch within 12 hours or as directed by MD 5 patch 0   loratadine (CLARITIN) 10 MG tablet Take 10 mg by mouth daily.     magnesium oxide (MAG-OX) 400 MG tablet Take 400 mg by mouth daily.     nitroGLYCERIN (NITROSTAT) 0.4 MG SL tablet Place 1 tablet (0.4 mg total) under the tongue every 5 (five) minutes as needed for chest pain. 25 tablet 3   nystatin (MYCOSTATIN) 100000 UNIT/ML suspension Take 5 mLs by mouth as needed.     pantoprazole (PROTONIX) 40 MG tablet TAKE 1 TABLET(40 MG) BY MOUTH DAILY 90 tablet 2   potassium chloride (KLOR-CON) 10 MEQ tablet Take 10 mEq by mouth daily.     pregabalin (LYRICA) 50 MG capsule Take 50 mg by mouth 3 (three) times daily.     rosuvastatin (CRESTOR) 40 MG tablet Take 40 mg by mouth daily.     sacubitril-valsartan (ENTRESTO) 24-26 MG TAKE 1 TABLET BY MOUTH TWICE DAILY 60 tablet 3   spironolactone (ALDACTONE) 25 MG tablet Take 0.5 tablets (12.5 mg total) by mouth daily. 45 tablet 3   torsemide (DEMADEX) 20 MG tablet Take twice on Tuesday and Thursday take once on other days 180 tablet 3   TRESIBA FLEXTOUCH 200 UNIT/ML FlexTouch Pen Inject 40 Units into the skin 2 (two) times daily. 40 units in the morning and up to 45 units at night     TRINTELLIX 5 MG TABS tablet Take 5 mg by mouth at bedtime.     valACYclovir (VALTREX) 1000 MG tablet Take 1,000 mg by mouth 2 (two) times daily as needed (flares).     Vericiguat (VERQUVO) 5 MG TABS Take 1 tablet (5 mg total) by mouth daily. 42 tablet 0   vitamin B-12 (CYANOCOBALAMIN) 1000 MCG tablet Take 1,000 mcg by mouth daily.     Vitamin D, Ergocalciferol, (DRISDOL) 1.25 MG (50000 UNIT) CAPS capsule Take 50,000 Units by mouth once a week. sundays     No current facility-administered medications on file prior to visit.   Allergies  Allergen Reactions   Bee Venom Anaphylaxis   Sumatriptan Other (See Comments) and Shortness Of Breath    Migraine worsened   Tramadol Hives   Amoxicillin-Pot Clavulanate Diarrhea,  Nausea And Vomiting and Other (See Comments)    Other reaction(s): GI Intolerance   Oxycodone Itching and Hives    Other reaction(s): Other (see comments) "Funny feeling in head"  "off the edge"    Buprenorphine Hcl     Other reaction(s): Itching   Clarithromycin     Abdominal pain and diarrhea Abdominal pain and diarrhea Abdominal pain and diarrhea   Duloxetine Hcl     drowsiness drowsiness drowsiness   Hydrocodone     Other reaction(s): Itching   Lactose     Upset stomach, gas/bloating    Liraglutide     Abdominal discomfort Abdominal discomfort Abdominal discomfort  Tramadol Hcl     Other reaction(s): Itching   Current Medications, Allergies, Past Medical History, Past Surgical History, Family History and Social History were reviewed in [employer] record.  Review of Systems:   Constitutional: Negative for fever, sweats, chills or weight loss.  Respiratory: Negative for shortness of breath.   Cardiovascular: Negative for chest pain, palpitations and leg swelling.  Gastrointestinal: See HPI.  Musculoskeletal: Negative for back pain or muscle aches.  Neurological: Negative for dizziness, headaches or paresthesias.   Physical Exam: Ht 5' 3.5" (1.613 m)    Wt 197 lb 8 oz (89.6 kg)    BMI 34.44 kg/m  General: Pleasant 71 year old female in no acute distress. Head: Normocephalic and atraumatic. Eyes: No scleral icterus. Conjunctiva pink . Ears: Normal auditory acuity. Mouth: Dentition intact. No ulcers or lesions.  Lungs: Clear throughout to auscultation. Heart: Regular rate and rhythm, no murmur. Abdomen: Soft, nontender and nondistended. No masses or hepatomegaly. Normal bowel sounds x 4 quadrants.  Rectal: No external hemorrhoids.  Internal hemorrhoids present without prolapse.  No stool or blood in the rectal vault.  No mass.  Diminished sphincter tone. Musculoskeletal: Symmetrical with no gross deformities. Extremities: No  edema. Neurological: Alert oriented x 4. No focal deficits.  Psychological: Alert and cooperative. Normal mood and affect  Assessment and Recommendations:  97) 71 year old female with fecal leakage with the passage of gas per the rectum -Benefiber 1 tablespoon daily to bulk up stool with the intention of a cleaner exit, patient to stop Benefiber if she develops bloat or worse flatulence -Anusol suppositories 1 PR nightly x5 nights to reduce minor hemorrhoidal inflammation  2) Chronic RLQ pain which comes and goes.  She takes Dicyclomine as needed which relieves this pain.  CTAP without contrast to evaluate her RLQ pain was done 06/12/2021 which showed possible segmental small bowel wall thickening in the ventral right paramidline abdomen just above the umbilicus and stool was noted. throughout the majority of the colon.  -Dicyclomine 10 mg 1 p.o. 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  No overt GI bleeding.  Hemoglobin level 13.1 on 11/07/2021.   4) History of colon polyps. Her most recent colonoscopy was October 07, 2019: 3 diminutive tubular adenomas removed -Next colonoscopy due 10/2024   5) Past colon resection secondary to diverticular disease    6) Coronary artery disease s/p stent on Plavix, ischemic cardiomyopathy with severe CHF s/p biventricular ICD placement 12/2020

## 2021-12-17 NOTE — Patient Instructions (Addendum)
It was great seeing you today! Thank you for entrusting me with your care and choosing [facility].  Noralyn Pick, CRNP  MEDICATION: We have sent the following medication to your pharmacy for you to pick up at your convenience: Dicyclomine 10 MG take 1 tablet twice a day. Anusol rectal suppository. Insert 1 per rectum at bedtime for 5 nights.  RECOMMENDATIONS: Benefiber- 1 tablespoon daily. Gas-X 1 tablet twice a day as needed. Please contact our office if your symptoms worsen.  The [place] [facility] providers would like to encourage you to use [facility] to communicate with providers for non-urgent requests or questions.  Due to long hold times on the telephone, sending your provider a message by [facility] may be faster and more efficient way to get a response. Please allow 48 business hours for a response.  Please remember that this is for non-urgent requests/questions. If you are age 54 or older, your body mass index should be between 23-30. Your Body mass index is 34.44 kg/m. If this is out of the aforementioned range listed, please consider follow up with your Primary Care Provider.  If you are age 16 or younger, your body mass index should be between 19-25. Your Body mass index is 34.44 kg/m. If this is out of the aformentioned range listed, please consider follow up with your Primary Care Provider.

## 2021-12-24 NOTE — Progress Notes (Signed)
Reviewed and agree with documentation and assessment and plan. K. Veena Cordarryl Monrreal , MD   

## 2022-01-02 ENCOUNTER — Telehealth: Payer: Self-pay | Admitting: Cardiology

## 2022-01-02 NOTE — Telephone Encounter (Signed)
Patient would like to speak to nurse. Patient is in the hospital in [facility] and she would like for Dr. Harriet Masson to know that.  They are trying to establish a care plan with the cardiologist there, but Dr. Harriet Masson is her heart doctor and she would like her to be involved.  They are talking about possible doing a cardiac cath.

## 2022-01-02 NOTE — Telephone Encounter (Signed)
Spoke to patient . She wanted Dr Servando Salina to be aware she has been admitted to [facility].  Patient  states she was having chest pain - tightness around rib cage.  She states she has an another echo at [facility].   She states  EF is 25 %.  Patient states her Hosptialist stated he was calling in a cardiac consult there.  Patient states she does not want  any retesting  like a cath done  because Dr Servando Salina has done them .patient also stated doctors said Dr Servando Salina could see  information from [facility]. RN informed patient that is true but not everyday treatment care through [facility] to another  facility .   RN informed patient  Dr Servando Salina is not affiliated with [facility]-  if patient would like to transferred  she would have to ask doctors there to contact  [facility] for  transfer to [facility] or continue treatment  and follow up with Dr Servando Salina after discharger.  Patient aware will informed Dr Servando Salina

## 2022-01-03 ENCOUNTER — Ambulatory Visit (INDEPENDENT_AMBULATORY_CARE_PROVIDER_SITE_OTHER): Payer: Medicare Other

## 2022-01-03 DIAGNOSIS — I255 Ischemic cardiomyopathy: Secondary | ICD-10-CM

## 2022-01-05 LAB — CUP PACEART REMOTE DEVICE CHECK
Battery Remaining Longevity: 108 mo
Battery Remaining Percentage: 100 %
Brady Statistic RA Percent Paced: 0 %
Brady Statistic RV Percent Paced: 100 %
Date Time Interrogation Session: 20230204161200
HighPow Impedance: 80 Ohm
Implantable Lead Implant Date: 20220131
Implantable Lead Implant Date: 20220131
Implantable Lead Implant Date: 20220131
Implantable Lead Location: 753858
Implantable Lead Location: 753859
Implantable Lead Location: 753860
Implantable Lead Model: 137
Implantable Lead Model: 3830
Implantable Lead Model: 7840
Implantable Lead Serial Number: 1047234
Implantable Lead Serial Number: 301179
Implantable Pulse Generator Implant Date: 20220131
Lead Channel Impedance Value: 534 Ohm
Lead Channel Impedance Value: 558 Ohm
Lead Channel Impedance Value: 772 Ohm
Lead Channel Setting Pacing Amplitude: 0.1 V
Lead Channel Setting Pacing Amplitude: 3.5 V
Lead Channel Setting Pacing Amplitude: 3.5 V
Lead Channel Setting Pacing Pulse Width: 0.1 ms
Lead Channel Setting Pacing Pulse Width: 0.4 ms
Lead Channel Setting Sensing Sensitivity: 0.5 mV
Lead Channel Setting Sensing Sensitivity: 1 mV
Pulse Gen Serial Number: 149649

## 2022-01-06 NOTE — Telephone Encounter (Addendum)
Pt went to PT last week, Was asked where does she have pain and what is going on. Pain in side like muscle spasm. Pt was seen by doc the week before he states it seem musculoskeletal, prescribed rest and otc tylenol. They did a cath. I still have the pressure but it is not as bed. They have been giving me NTG, morphine and tylenol. Pt was d/c Saturday, will see her PCP tomorrow. Pt added to see Dr. Servando Salina 2/21. Pt needs samples of Verquvo. Will check in this.

## 2022-01-08 NOTE — Progress Notes (Signed)
Remote ICD transmission.   

## 2022-01-14 ENCOUNTER — Telehealth: Payer: Self-pay | Admitting: Cardiology

## 2022-01-14 NOTE — Telephone Encounter (Signed)
Calling stating that they need a Valid Diagnostic code on the provider page (5th page) of the [facility] Enrollment Form.   [employer] [phone]  fax#[phone]

## 2022-01-17 NOTE — Telephone Encounter (Signed)
Faxed addition information and codes to company.

## 2022-01-21 ENCOUNTER — Encounter: Payer: Self-pay | Admitting: Cardiology

## 2022-01-21 ENCOUNTER — Ambulatory Visit (INDEPENDENT_AMBULATORY_CARE_PROVIDER_SITE_OTHER): Payer: Medicare Other | Admitting: Cardiology

## 2022-01-21 ENCOUNTER — Other Ambulatory Visit: Payer: Self-pay

## 2022-01-21 VITALS — BP 118/62 | HR 84 | Ht 63.5 in | Wt 197.8 lb

## 2022-01-21 DIAGNOSIS — Z79899 Other long term (current) drug therapy: Secondary | ICD-10-CM | POA: Diagnosis not present

## 2022-01-21 DIAGNOSIS — I519 Heart disease, unspecified: Secondary | ICD-10-CM | POA: Diagnosis not present

## 2022-01-21 DIAGNOSIS — I739 Peripheral vascular disease, unspecified: Secondary | ICD-10-CM | POA: Diagnosis not present

## 2022-01-21 DIAGNOSIS — E782 Mixed hyperlipidemia: Secondary | ICD-10-CM

## 2022-01-21 DIAGNOSIS — I2510 Atherosclerotic heart disease of native coronary artery without angina pectoris: Secondary | ICD-10-CM

## 2022-01-21 DIAGNOSIS — I255 Ischemic cardiomyopathy: Secondary | ICD-10-CM

## 2022-01-21 DIAGNOSIS — E119 Type 2 diabetes mellitus without complications: Secondary | ICD-10-CM

## 2022-01-21 NOTE — Patient Instructions (Signed)
Medication Instructions:  Your physician recommends that you continue on your current medications as directed. Please refer to the Current Medication list given to you today.  *If you need a refill on your cardiac medications before your next appointment, please call your pharmacy*   Lab Work: Your physician recommends that you return for lab work in:  TODAY: BMET, Mag If you have labs (blood work) drawn today and your tests are completely normal, you will receive your results only by: MyChart Message (if you have MyChart) OR A paper copy in the mail If you have any lab test that is abnormal or we need to change your treatment, we will call you to review the results.   Testing/Procedures: None   Follow-Up: At [facility], you and your health needs are our priority.  As part of our continuing mission to provide you with exceptional heart care, we have created designated Provider Care Teams.  These Care Teams include your primary Cardiologist (physician) and Advanced Practice Providers (APPs -  Physician Assistants and Nurse Practitioners) who all work together to provide you with the care you need, when you need it.  We recommend signing up for the patient portal called "MyChart".  Sign up information is provided on this After Visit Summary.  MyChart is used to connect with patients for Virtual Visits (Telemedicine).  Patients are able to view lab/test results, encounter notes, upcoming appointments, etc.  Non-urgent messages can be sent to your provider as well.   To learn more about what you can do with MyChart, go to [email or web address].    Your next appointment:   6 month(s)  The format for your next appointment:   In Person  Provider:   Kardie Tobb, DO     Other Instructions   

## 2022-01-21 NOTE — Progress Notes (Signed)
Cardiology Office Note:    Date:  01/21/2022   ID:  Karen Warner, DOB Mar 26, 1951, MRN [id number]  PCP:  Hadley Pen, MD  Cardiologist:  Thomasene Ripple, DO  Electrophysiologist:  None   Referring MD: Hadley Pen, MD   " I a doing well"  History of Present Illness:    Karen Warner is a 71 y.o. female with a hx of coronary artery disease status post PCI, ischemic cardiomyopathy status post BiV pacemaker during her hospitalization in January 2022 presented for syncope episode, heart failure reduced ejection fraction recent EF 20 to 25% on her echo which was done in January 2022, sleep apnea is here today for follow-up visit.   I saw the patient most recently on January 16, 2021 at that time she had had a fall and was concerned about information on her device.  We reviewed her device check which was normal.  She did have some bilateral leg edema but was still at her dry weight.  Therefore her diuretics was not changed.   At her visit on January 16, 2021 we continued her torsemide which was every other day.  In terms of her ischemic cardiomyopathy we kept the Entresto, Coreg with no changes.  She was off Aldactone but her blood pressure did not tolerate starting this medication.    On March 15, 2021 she appeared to be doing well from a cardiovascular standpoint.  She was still waiting for her CPAP titration study.   I saw the patient on July 16, 2021 at that time she was post hospitalization from [facility] for heart failure exacerbation.  During that visit I started the patient on Verquvo 2.5mg  daily with the intention to titrate up to the maximum tolerable dose.  Continue her other guideline medical therapy.  Therapy.  I saw the patient September 17, 2021 At that time she has some increasing edema I increased her torsemide.   Since I saw the patient she had been hospitalized at [facility].  She underwent heart catheterization while  she was there.  No PCI was noted.  Past Medical History:  Diagnosis Date   Anemia 12/12/2019   Arthralgia 01/14/2020   Arthritis    Asthma    Atherosclerotic heart disease of native coronary artery without angina pectoris 04/13/2014   CHF (congestive heart failure) (HCC)    Chronic bilateral low back pain with bilateral sciatica 01/13/2019   Chronic bladder pain 11/08/2014   Last Assessment & Plan:  Formatting of this note might be different from the original. Patient has chronic pain syndrome in general I think this is unfortunately worsening her pelvic pain and bladder pain her examination today was very reassuring I am advised her to use the estrogen cream I did start her on Elavil 10 milligrams at that time if she does tolerated will increase it to 25 milligrams.    Chronic headaches    Chronic idiopathic constipation 12/22/2014   Formatting of this note might be different from the original. Last Assessment & Plan:  For better bowel emptying please use either Citrucel / Benefiber start with 2 tablespoons daily and titrate up or down to effect.  Also please use glycerin suppositories as needed to assist in evacuation. Last Assessment & Plan:  Formatting of this note might be different from the original. For better bowel empt   Chronic obstructive pulmonary disease, unspecified (HCC) 03/18/2018   Class 1 obesity due to excess calories with serious comorbidity  and body mass index (BMI) of 31.0 to 31.9 in adult 06/26/2020   Colon polyps    COPD (chronic obstructive pulmonary disease) (HCC)    Coronary artery disease    DDD (degenerative disc disease), cervical 01/13/2019   Depression 11/05/2019   Diabetic polyneuropathy associated with type 2 diabetes mellitus (HCC) 02/24/2019   Diverticulitis 05/20/2018   Diverticulitis of colon 03/18/2018   Family history of ischemic heart disease (IHD) 08/16/2013   Fibromyalgia affecting multiple sites 01/14/2020   Gastro-esophageal reflux disease without  esophagitis 01/13/2019   Glaucoma 11/08/2014   Hordeolum externum of left upper eyelid 03/13/2020   Hypertension 11/08/2014   IBS (irritable bowel syndrome)    Iron deficiency anemia 01/13/2019   Left renal mass 11/08/2019   Leukocytosis 05/28/2018   Low vitamin B12 level 03/13/2020   Low vitamin D level 12/12/2019   LV dysfunction 05/31/2019   Malignant essential hypertension 01/22/2016   Migraine, unspecified, not intractable, without status migrainosus 11/08/2014   Mixed hyperlipidemia 01/13/2019   Myocardial infarction (HCC)    Other premature beats 11/08/2014   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 Last Assessment & Plan:  For vaginal atrophy please place a pea size dab of Estrogen vaginal cream  into the vagina 3 times a week ( Monday, Wednesday, Friday) Last Assessment & Plan:  Formatting of this note might be different from the original. For vaginal atrophy please place a pea size dab of Estrogen vaginal cream  into the vagina     Past Surgical History:  Procedure Laterality Date   ABDOMINAL HYSTERECTOMY     BIV ICD INSERTION CRT-D N/A 12/31/2020   Procedure: BIV ICD INSERTION CRT-D;  Surgeon: Marinus Maw, MD;  Location: [facility] INVASIVE CV LAB;  Service: Cardiovascular;  Laterality: N/A;   BLEPHAROPLASTY Bilateral    COLON SURGERY     CORONARY ANGIOPLASTY WITH STENT PLACEMENT  2020   X3   CORONARY STENT INTERVENTION N/A 08/03/2020   Procedure: CORONARY STENT INTERVENTION;  Surgeon: Tonny Bollman, MD;  Location: [facility] INVASIVE CV LAB;  Service: Cardiovascular;  Laterality: N/A;   INTRAVASCULAR PRESSURE WIRE/FFR STUDY N/A 08/03/2020   Procedure: INTRAVASCULAR PRESSURE  WIRE/FFR STUDY;  Surgeon: Tonny Bollman, MD;  Location: [facility] INVASIVE CV LAB;  Service: Cardiovascular;  Laterality: N/A;   LEFT HEART CATH AND CORONARY ANGIOGRAPHY N/A 08/03/2020   Procedure: LEFT HEART CATH AND CORONARY ANGIOGRAPHY;  Surgeon: Tonny Bollman, MD;  Location: [facility] INVASIVE CV LAB;  Service: Cardiovascular;  Laterality: N/A;   REFRACTIVE SURGERY Left    [place]    Current Medications: Current Meds  Medication Sig   acetaminophen (TYLENOL) 325 MG tablet Take 2 tablets by mouth every 6 (six) hours as needed for pain.   ACETAMINOPHEN-BUTALBITAL 50-325 MG TABS Take 1 tablet by mouth every 4 (four) hours as needed for severe pain.   albuterol (PROVENTIL) (2.5 MG/3ML) 0.083% nebulizer solution INL CONTENTS OF 1 VIAL VIA NEBULIZER Q 4 H PRF WHEEZING OR COUGH   albuterol (VENTOLIN HFA) 108 (90 Base) MCG/ACT inhaler Inhale 2 puffs into the lungs every 6 (six) hours as needed for shortness of breath or wheezing.   aspirin 81 MG EC tablet 81 mg  daily.   baclofen (LIORESAL) 10 MG tablet Take 10 mg by mouth 2 (two) times daily.   carvedilol (COREG) 3.125 MG tablet TAKE 1 TABLET(3.125 MG) BY MOUTH TWICE DAILY WITH A MEAL   clopidogrel (PLAVIX) 75 MG tablet Take 1 tablet (75 mg total) by mouth daily.   diclofenac Sodium (VOLTAREN) 1 % GEL Apply 2 g topically 2 (two) times daily as needed (pain).   dicyclomine (BENTYL) 10 MG capsule Take 1 capsule (10 mg total) by mouth in the morning and at bedtime.   famotidine (PEPCID) 40 MG tablet Take 40 mg by mouth 3 (three) times daily.   FARXIGA 10 MG TABS tablet TAKE 1 TABLET(10 MG) BY MOUTH DAILY BEFORE BREAKFAST   ferrous sulfate 324 MG TBEC Take 324 mg by mouth daily with breakfast.   fluticasone (FLONASE) 50 MCG/ACT nasal spray Place 1 spray into both nostrils as needed for allergies or rhinitis.   folic acid (FOLVITE) 1 MG tablet Take 1 mg by mouth daily.   insulin lispro (HUMALOG) 100 UNIT/ML KwikPen Inject 16 Units into the skin 3 (three)  times daily. Administer 16 units three times daily before a meal per sliding scale   isosorbide mononitrate (IMDUR) 30 MG 24 hr tablet Take by mouth.   lidocaine (LIDODERM) 5 % Place 1 patch onto the skin daily. Remove & Discard patch within 12 hours or as directed by MD   loratadine (CLARITIN) 10 MG tablet Take 10 mg by mouth daily.   magnesium oxide (MAG-OX) 400 MG tablet Take 400 mg by mouth daily.   nitroGLYCERIN (NITROSTAT) 0.4 MG SL tablet Place 1 tablet (0.4 mg total) under the tongue every 5 (five) minutes as needed for chest pain.   nystatin (MYCOSTATIN) 100000 UNIT/ML suspension Take 5 mLs by mouth as needed.   pantoprazole (PROTONIX) 40 MG tablet TAKE 1 TABLET(40 MG) BY MOUTH DAILY   pregabalin (LYRICA) 50 MG capsule Take 50 mg by mouth 3 (three) times daily.   rosuvastatin (CRESTOR) 40 MG tablet Take 40 mg by mouth daily.   sacubitril-valsartan (ENTRESTO) 24-26 MG TAKE 1 TABLET BY MOUTH TWICE DAILY   torsemide (DEMADEX) 20 MG tablet Take twice on Tuesday and Thursday take once on other days   TRESIBA FLEXTOUCH 200 UNIT/ML FlexTouch Pen Inject 40 Units into the skin 2 (two) times daily. 40 units in the morning and up to 45 units at night   TRINTELLIX 5 MG TABS tablet Take 5 mg by mouth at bedtime.   valACYclovir (VALTREX) 1000 MG tablet Take 1,000 mg by mouth 2 (two) times daily as needed (flares).   Vericiguat (VERQUVO) 5 MG TABS Take 1 tablet (5 mg total) by mouth daily.   vitamin B-12 (CYANOCOBALAMIN) 1000 MCG tablet Take 1,000 mcg by mouth daily.   Vitamin D, Ergocalciferol, (DRISDOL) 1.25 MG (50000 UNIT) CAPS capsule Take 50,000 Units by mouth once a week. sundays     Allergies:   Bee venom, Sumatriptan, Tramadol, Amoxicillin-pot clavulanate, Oxycodone, Buprenorphine hcl, Clarithromycin, Duloxetine hcl, Hydrocodone, Lactose, Liraglutide, and Tramadol hcl   Social History   Socioeconomic History   Marital status: Significant Other    Spouse name: Not on file   Number of  children: 3   Years of education: Not on file   Highest education level: Not on file  Occupational History   Occupation: retired  Tobacco Use   Smoking status: Former    Types: Cigarettes    Quit date: 2021    Years since quitting: 2.1  Smokeless tobacco: Never  Vaping Use   Vaping Use: Never used  Substance and Sexual Activity   Alcohol use: Never   Drug use: Never   Sexual activity: Not on file  Other Topics Concern   Not on file  Social History Narrative   Not on file   Social Determinants of Health   Financial Resource Strain: Not on file  Food Insecurity: Not on file  Transportation Needs: Not on file  Physical Activity: Not on file  Stress: Not on file  Social Connections: Not on file     Family History: The patient's family history includes Colon polyps in her mother; Coronary artery disease in her mother; Diabetes in her daughter, maternal grandmother, and mother; Glaucoma in her mother; Heart disease in her father; Hypertension in her brother and mother; Lung cancer in her mother; Migraines in her father and son.  ROS:   Review of Systems  Constitution: Negative for decreased appetite, fever and weight gain.  HENT: Negative for congestion, ear discharge, hoarse voice and sore throat.   Eyes: Negative for discharge, redness, vision loss in right eye and visual halos.  Cardiovascular: Negative for chest pain, dyspnea on exertion, leg swelling, orthopnea and palpitations.  Respiratory: Negative for cough, hemoptysis, shortness of breath and snoring.   Endocrine: Negative for heat intolerance and polyphagia.  Hematologic/Lymphatic: Negative for bleeding problem. Does not bruise/bleed easily.  Skin: Negative for flushing, nail changes, rash and suspicious lesions.  Musculoskeletal: Negative for arthritis, joint pain, muscle cramps, myalgias, neck pain and stiffness.  Gastrointestinal: Negative for abdominal pain, bowel incontinence, diarrhea and excessive appetite.   Genitourinary: Negative for decreased libido, genital sores and incomplete emptying.  Neurological: Negative for brief paralysis, focal weakness, headaches and loss of balance.  Psychiatric/Behavioral: Negative for altered mental status, depression and suicidal ideas.  Allergic/Immunologic: Negative for HIV exposure and persistent infections.    EKGs/Labs/Other Studies Reviewed:    The following studies were reviewed today:   EKG:  None today   TTE 01/02/2022 SUMMARY  The left ventricle is severely dilated.  Left ventricular systolic function is severely reduced.  LV ejection fraction = 20-25%.  Left ventricular filling pattern is prolonged relaxation.  There is severe global hypokinesis of the left ventricle.  The right ventricle is normal in size and function.  There is no significant valvular stenosis or regurgitation.  The aortic sinus is normal size.  The IVC is normal in size with an inspiratory collapse of greater then  50%, suggesting normal right atrial pressure.  There is no pericardial effusion.  Compared to prior study dated 08/08/19, LV function has further  declined.    LHC 01/03/2022 At the conclusion of the procedure the catheter was removed with the aid  of the J-tip guidewire.  The sheath was then removed and hemostasis was  achieved with a preludesync Evo band.  There were no evident  complications.   ANGIOGRAM/CORONARY ARTERIOGRAM:    The Coronary arteries showed diffuse mild disease with no target for PCI   LEFT VENTRICULOGRAM:  Left ventriculography was not done, EDP normal   Complications:  None   Conclusions:    Mildly abnormal Coronary arteries as described.   RECOMMENDATION:   Medical therapy for Coronary artery disease risk factor reduction and  Angina  Coronary Findings Diagnostic Dominance: Left  Left Main: The vessel was visualized by angiography, is moderate in size and is angiographically normal.  Left Anterior Descending: Ost LAD to  Prox LAD lesion is 5% stenosed.  Mid LAD-1 lesion is 15% stenosed. Mid LAD-2 lesion is 20% stenosed.  First Obtuse Marginal Branch: 1st Mrg lesion is 15% stenosed. Left Posterior Descending Artery: LPDA lesion is 20% stenosed.  Right Coronary Artery: The vessel was visualized by angiography, is moderate in size and is angiographically normal.   Intervention  No interventions have been documented. Specimen Collected: --    Recent Labs: 08/16/2021: ALT 13; Hemoglobin 12.8; Platelets 250 09/17/2021: BUN 15; Creatinine, Ser 1.10; Magnesium 2.4; Potassium 4.6; Sodium 144  Recent Lipid Panel No results found for: CHOL, TRIG, HDL, CHOLHDL, VLDL, LDLCALC, LDLDIRECT  Physical Exam:    VS:  BP 118/62    Pulse 84    Ht 5' 3.5" (1.613 m)    Wt 197 lb 12.8 oz (89.7 kg)    SpO2 95%    BMI 34.49 kg/m     Wt Readings from Last 3 Encounters:  01/21/22 197 lb 12.8 oz (89.7 kg)  12/17/21 197 lb 8 oz (89.6 kg)  09/17/21 201 lb 12.8 oz (91.5 kg)     GEN: Well nourished, well developed in no acute distress HEENT: Normal NECK: No JVD; No carotid bruits LYMPHATICS: No lymphadenopathy CARDIAC: S1S2 noted,RRR, no murmurs, rubs, gallops RESPIRATORY:  Clear to auscultation without rales, wheezing or rhonchi  ABDOMEN: Soft, non-tender, non-distended, +bowel sounds, no guarding. EXTREMITIES: No edema, No cyanosis, no clubbing MUSCULOSKELETAL:  No deformity  SKIN: Warm and dry NEUROLOGIC:  Alert and oriented x 3, non-focal PSYCHIATRIC:  Normal affect, good insight  ASSESSMENT:    1. Medication management   2. Atherosclerosis of native coronary artery of native heart without angina pectoris   3. Left ventricular dysfunction   4. PVD (peripheral vascular disease) (HCC)   5. Ischemic cardiomyopathy   6. Type 2 diabetes mellitus without complication, without long-term current use of insulin (HCC)   7. Mixed hyperlipidemia    PLAN:    Since she left the hospital she has been doing well.  She has not had  any anginal symptoms.  Clinically she appears to be euvolemic.   We will get blood work with BMP and mag to assess electrolytes as well as her kidney function.  Ischemic cardiomyopathy-continue entresto 24-26 twice daily, farxiga 10 mg daily, Coreg 3.125 mg twice daily, Vericiguat 5 mg daily, Aldactone 12.5 mg daily.  The patient understands the need to lose weight with diet and exercise. We have discussed specific strategies for this.  Diabetes mellitus -this is being managed by his primary care doctor.  No adjustments for antidiabetic medications were made today.  Hyperlipidemia - continue with current statin medication.  The patient is in agreement with the above plan. The patient left the office in stable condition.  The patient will follow up in 6 months.   Medication Adjustments/Labs and Tests Ordered: Current medicines are reviewed at length with the patient today.  Concerns regarding medicines are outlined above.  Orders Placed This Encounter  Procedures   Basic Metabolic Panel (BMET)   Magnesium   No orders of the defined types were placed in this encounter.   Patient Instructions  Medication Instructions:  Your physician recommends that you continue on your current medications as directed. Please refer to the Current Medication list given to you today.  *If you need a refill on your cardiac medications before your next appointment, please call your pharmacy*   Lab Work: Your physician recommends that you return for lab work in:  TODAY: BMET, Mag If you have labs (blood work)  drawn today and your tests are completely normal, you will receive your results only by: MyChart Message (if you have MyChart) OR A paper copy in the mail If you have any lab test that is abnormal or we need to change your treatment, we will call you to review the results.   Testing/Procedures: None   Follow-Up: At [facility], you and your health needs are our priority.  As part of our  continuing mission to provide you with exceptional heart care, we have created designated Provider Care Teams.  These Care Teams include your primary Cardiologist (physician) and Advanced Practice Providers (APPs -  Physician Assistants and Nurse Practitioners) who all work together to provide you with the care you need, when you need it.  We recommend signing up for the patient portal called "MyChart".  Sign up information is provided on this After Visit Summary.  MyChart is used to connect with patients for Virtual Visits (Telemedicine).  Patients are able to view lab/test results, encounter notes, upcoming appointments, etc.  Non-urgent messages can be sent to your provider as well.   To learn more about what you can do with MyChart, go to [email or web address].    Your next appointment:   6 month(s)  The format for your next appointment:   In Person  Provider:   Thomasene Ripple, DO     Other Instructions     Adopting a Healthy Lifestyle.  Know what a healthy weight is for you (roughly BMI <25) and aim to maintain this   Aim for 7+ servings of fruits and vegetables daily   65-80+ fluid ounces of water or unsweet tea for healthy kidneys   Limit to max 1 drink of alcohol per day; avoid smoking/tobacco   Limit animal fats in diet for cholesterol and heart health - choose grass fed whenever available   Avoid highly processed foods, and foods high in saturated/trans fats   Aim for low stress - take time to unwind and care for your mental health   Aim for 150 min of moderate intensity exercise weekly for heart health, and weights twice weekly for bone health   Aim for 7-9 hours of sleep daily   When it comes to diets, agreement about the perfect plan isnt easy to find, even among the experts. Experts at the [facility] of [employer] developed an idea known as the Healthy Eating Plate. Just imagine a plate divided into logical, healthy portions.   The emphasis is on diet  quality:   Load up on vegetables and fruits - one-half of your plate: Aim for color and variety, and remember that potatoes dont count.   Go for whole grains - one-quarter of your plate: Whole wheat, barley, wheat berries, quinoa, oats, brown rice, and foods made with them. If you want pasta, go with whole wheat pasta.   Protein power - one-quarter of your plate: Fish, chicken, beans, and nuts are all healthy, versatile protein sources. Limit red meat.   The diet, however, does go beyond the plate, offering a few other suggestions.   Use healthy plant oils, such as olive, canola, soy, corn, sunflower and peanut. Check the labels, and avoid partially hydrogenated oil, which have unhealthy trans fats.   If youre thirsty, drink water. Coffee and tea are good in moderation, but skip sugary drinks and limit milk and dairy products to one or two daily servings.   The type of carbohydrate in the diet is more important than the amount. Some  sources of carbohydrates, such as vegetables, fruits, whole grains, and beans-are healthier than others.   Finally, stay active  Signed, Thomasene Ripple, DO  01/21/2022 5:12 PM    [facility]

## 2022-01-22 ENCOUNTER — Other Ambulatory Visit: Payer: Self-pay

## 2022-01-22 ENCOUNTER — Telehealth: Payer: Self-pay

## 2022-01-22 LAB — BASIC METABOLIC PANEL
BUN/Creatinine Ratio: 12 (ref 12–28)
BUN: 15 mg/dL (ref 8–27)
CO2: 25 mmol/L (ref 20–29)
Calcium: 9.2 mg/dL (ref 8.7–10.3)
Chloride: 103 mmol/L (ref 96–106)
Creatinine, Ser: 1.21 mg/dL — ABNORMAL HIGH (ref 0.57–1.00)
Glucose: 222 mg/dL — ABNORMAL HIGH (ref 70–99)
Potassium: 4.1 mmol/L (ref 3.5–5.2)
Sodium: 142 mmol/L (ref 134–144)
eGFR: 48 mL/min/{1.73_m2} — ABNORMAL LOW (ref >59)

## 2022-01-22 LAB — MAGNESIUM: Magnesium: 2.3 mg/dL (ref 1.6–2.3)

## 2022-01-22 MED ORDER — VERQUVO 5 MG PO TABS: 5.0000 mg | ORAL_TABLET | Freq: Every day | ORAL | 3 refills | Status: DC

## 2022-01-22 NOTE — Progress Notes (Signed)
Called pt's pharmacy to see if they created the PA for Verquvo 2.5 mg she states they do not have a prescription for the 5 mg strength. I sent the prescription to the pharmacy, will see if they will fill it or need a PA to be completed.

## 2022-01-22 NOTE — Telephone Encounter (Signed)
Called pt to let her know I will place samples up front for her to pick up. Verquvo 2.5 mg she will just have to take two tablets to equal the 5 mg amount. No answer, left a message for her to return the call.

## 2022-01-30 ENCOUNTER — Other Ambulatory Visit: Payer: Self-pay

## 2022-01-30 ENCOUNTER — Telehealth: Payer: Self-pay | Admitting: Cardiology

## 2022-01-30 NOTE — Telephone Encounter (Signed)
[employer] called for a prior Information systems manager for [employer].  They said this was there third call on this, it would be there last attempt.  ?

## 2022-01-30 NOTE — Telephone Encounter (Signed)
Called [employer] spoke with April she states that we need to do a prior auth in cover my meds  before they can approve the pt assistance ?CMM KEY [id number]. Per April she states that this case will remain open until the prior Karen Warner is done. Will forward to Dr Terrial Rhodes nurse to have this completed. ?

## 2022-01-31 NOTE — Telephone Encounter (Signed)
Called [facility] to see how much pt's Karen Warner was the cost was $518/90 days. He further states that pt still has to pay the deductible. ?

## 2022-01-31 NOTE — Telephone Encounter (Signed)
Unfortunately, there is no great alternative to this medication. If too expensive, she will likely need to stop the medication and we can reach out to the rep to see if there is patient assistance we can get for her.  ?

## 2022-01-31 NOTE — Telephone Encounter (Signed)
Entered prior auth in cover my meds will await result.  ?Karen Warner - PA Case ID: [id number] Need help? Call us at [phone] ?

## 2022-01-31 NOTE — Telephone Encounter (Signed)
Your request has been approved ?Request Reference Number: [id number]. VERQUVO TAB 2.5MG  is approved through 11/30/2022. Your patient may now fill this prescription and it will be covered. ?

## 2022-02-05 NOTE — Telephone Encounter (Signed)
No updates at this time. Will continue to monitor.   ?

## 2022-02-06 NOTE — Progress Notes (Incomplete)
[facility]  [address] [place],  [place]  [address] [phone]  Clinic Day:  02/13/2022  Referring physician: Hadley Pen, MD  This document serves as a record of services personally performed by Dequincy Kirby Funk, MD. It was created on their behalf by [facility] E, a trained medical scribe. The creation of this record is based on the scribe's personal observations and the provider's statements to them.  HISTORY OF PRESENT ILLNESS:  The patient is a 71 y.o. 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  In the past, IV Feraheme was effective in replenishing her iron stores and improving her hemoglobin.  She comes in today to reassess her anemia.  Since her last visit, the patient has been doing fairly well.  She denies having increased fatigue or any overt forms of blood loss which concern her for progressive anemia.   PHYSICAL EXAM:  There were no vitals taken for this visit. Wt Readings from Last 3 Encounters:  01/21/22 197 lb 12.8 oz (89.7 kg)  12/17/21 197 lb 8 oz (89.6 kg)  09/17/21 201 lb 12.8 oz (91.5 kg)   There is no height or weight on file to calculate BMI. Performance status (ECOG): 1 - Symptomatic but completely ambulatory Physical Exam Constitutional:      General: She is not in acute distress.    Appearance: Normal appearance. She is normal weight.  HENT:     Head: Normocephalic and atraumatic.  Eyes:     General: No scleral icterus.    Extraocular Movements: Extraocular movements intact.     Conjunctiva/sclera: Conjunctivae normal.     Pupils: Pupils are equal, round, and reactive to light.  Cardiovascular:     Rate and Rhythm: Normal rate and regular rhythm.     Pulses: Normal pulses.     Heart sounds: Normal heart sounds. No murmur heard.   No friction rub. No gallop.  Pulmonary:     Effort: Pulmonary effort is normal. No respiratory distress.     Breath sounds: Normal  breath sounds.  Abdominal:     General: Bowel sounds are normal. There is no distension.     Palpations: Abdomen is soft. There is no hepatomegaly, splenomegaly or mass.     Tenderness: There is no abdominal tenderness.  Musculoskeletal:        General: Normal range of motion.     Cervical back: Normal range of motion and neck supple.     Right lower leg: No edema.     Left lower leg: No edema.  Lymphadenopathy:     Cervical: No cervical adenopathy.  Skin:    General: Skin is warm and dry.  Neurological:     General: No focal deficit present.     Mental Status: She is alert and oriented to person, place, and time. Mental status is at baseline.  Psychiatric:        Mood and Affect: Mood normal.        Behavior: Behavior normal.        Thought Content: Thought content normal.        Judgment: Judgment normal.    LABS:   CBC Latest Ref Rng & Units 08/16/2021 02/14/2021 12/30/2020  WBC - 6.4 4.9 7.9  Hemoglobin 12.0 - 16.0 12.8 12.7 11.3(L)  Hematocrit 36 - 46 38 39 37.8  Platelets 150 - 399 250 234 313   CMP Latest Ref Rng & Units 01/21/2022 09/17/2021 08/16/2021  Glucose 70 - 99 mg/dL 222(H) 132(H) -  BUN 8 - 27 mg/dL 15 15 18   Creatinine 0.57 - 1.00 mg/dL 1.21(H) 1.10(H) 1.2(A)  Sodium 134 - 144 mmol/L 142 144 137  Potassium 3.5 - 5.2 mmol/L 4.1 4.6 4.1  Chloride 96 - 106 mmol/L 103 105 103  CO2 20 - 29 mmol/L 25 23 24(A)  Calcium 8.7 - 10.3 mg/dL 9.2 9.7 9.1  Total Protein 6.5 - 8.1 g/dL - - -  Total Bilirubin 0.3 - 1.2 mg/dL - - -  Alkaline Phos 25 - 125 - - 76  AST 13 - 35 - - 26  ALT 7 - 35 - - 13    Ref. Range 02/14/2021 14:32  Iron Latest Ref Range: 28 - 170 ug/dL 162  UIBC Latest Units: ug/dL 201  TIBC Latest Ref Range: 250 - 450 ug/dL 363  Saturation Ratios Latest Ref Range: 10.4 - 31.8 % 45 (H)  Ferritin Latest Ref Range: 11 - 307 ng/mL 19   ASSESSMENT & PLAN:  Assessment/Plan:  A 71 y.o. female with anemia secondary to iron deficiency and previous renal  insufficiency.  I am pleased as her hemoglobin remains at an ideal level.  Her iron parameters also remain normal.  Clinically, she is doing very well.  As that is the case, I will see her back in 6 months for repeat clinical assessment.  The patient understands all the plans discussed today and is in agreement with them.    I, Rita Ohara, am acting as scribe for Marice Potter, MD    I have reviewed this report as typed by the medical scribe, and it is complete and accurate.  Dequincy Macarthur Critchley, MD

## 2022-02-10 DIAGNOSIS — I2510 Atherosclerotic heart disease of native coronary artery without angina pectoris: Secondary | ICD-10-CM | POA: Diagnosis not present

## 2022-02-10 DIAGNOSIS — Z950 Presence of cardiac pacemaker: Secondary | ICD-10-CM | POA: Diagnosis not present

## 2022-02-10 DIAGNOSIS — I5020 Unspecified systolic (congestive) heart failure: Secondary | ICD-10-CM | POA: Diagnosis not present

## 2022-02-10 DIAGNOSIS — R079 Chest pain, unspecified: Secondary | ICD-10-CM | POA: Diagnosis not present

## 2022-02-13 ENCOUNTER — Other Ambulatory Visit: Payer: Self-pay

## 2022-02-13 ENCOUNTER — Other Ambulatory Visit: Payer: Self-pay | Admitting: Oncology

## 2022-02-13 ENCOUNTER — Inpatient Hospital Stay (INDEPENDENT_AMBULATORY_CARE_PROVIDER_SITE_OTHER): Payer: Medicare Other | Admitting: Oncology

## 2022-02-13 ENCOUNTER — Inpatient Hospital Stay: Payer: Medicare Other | Attending: Oncology

## 2022-02-13 VITALS — BP 134/63 | HR 79 | Temp 97.8°F | Resp 14 | Ht 63.5 in | Wt 198.4 lb

## 2022-02-13 DIAGNOSIS — D631 Anemia in chronic kidney disease: Secondary | ICD-10-CM | POA: Diagnosis not present

## 2022-02-13 DIAGNOSIS — N189 Chronic kidney disease, unspecified: Secondary | ICD-10-CM

## 2022-02-13 DIAGNOSIS — D508 Other iron deficiency anemias: Secondary | ICD-10-CM | POA: Diagnosis not present

## 2022-02-13 DIAGNOSIS — D509 Iron deficiency anemia, unspecified: Secondary | ICD-10-CM | POA: Diagnosis present

## 2022-02-13 LAB — CBC AND DIFFERENTIAL
HCT: 40 (ref 36–46)
Hemoglobin: 12.5 (ref 12.0–16.0)
Neutrophils Absolute: 4.03
Platelets: 276 10*3/uL (ref 150–400)
WBC: 6.5

## 2022-02-13 LAB — BASIC METABOLIC PANEL
BUN: 15 (ref 4–21)
CO2: 29 — AB (ref 13–22)
Chloride: 107 (ref 99–108)
Creatinine: 1 (ref 0.5–1.1)
Glucose: 188
Potassium: 3.9 mEq/L (ref 3.5–5.1)
Sodium: 142 (ref 137–147)

## 2022-02-13 LAB — IRON AND TIBC
Iron: 46 ug/dL (ref 28–170)
Saturation Ratios: 13 % (ref 10.4–31.8)
TIBC: 360 ug/dL (ref 250–450)
UIBC: 314 ug/dL

## 2022-02-13 LAB — HEPATIC FUNCTION PANEL
ALT: 15 U/L (ref 7–35)
AST: 25 (ref 13–35)
Alkaline Phosphatase: 74 (ref 25–125)
Bilirubin, Total: 0.5

## 2022-02-13 LAB — FERRITIN: Ferritin: 22 ng/mL (ref 11–307)

## 2022-02-13 LAB — CBC: RBC: 4.39 (ref 3.87–5.11)

## 2022-02-13 LAB — COMPREHENSIVE METABOLIC PANEL
Albumin: 4 (ref 3.5–5.0)
Calcium: 8.8 (ref 8.7–10.7)

## 2022-02-13 NOTE — Telephone Encounter (Signed)
Patient states she received the approval for the verquvo, but it is approving 2.5 mg dose. She states she was prescribed the 5 mg dose.  ?

## 2022-02-14 ENCOUNTER — Telehealth: Payer: Self-pay

## 2022-02-14 NOTE — Telephone Encounter (Signed)
Called pt to let her know what the pharmacy said. She states she will call her insurance company can get back to [place]. Pt requested to be seen by Dr. Harriet Masson due to being see in the hospital recently and being prescribed NTG ointment by another cardiologist. She was added to the schedule.  ?

## 2022-02-14 NOTE — Telephone Encounter (Signed)
Called pt's pharmacy to see if they could run the medication for the pt. He ran the prescription for Verquvo 5 mg. Staff states it will be $500 for a 90 day supply and $180 for a 30 day supply. Pharmacy staff advised pt contact her insurance company and find out why she has to pay so much. Will call pt to let her know this information.   ?

## 2022-02-17 ENCOUNTER — Telehealth: Payer: Self-pay | Admitting: Cardiology

## 2022-02-17 NOTE — Telephone Encounter (Signed)
Spoke to patient she stated she needed to speak to Dr.Tobb's RN about patient assistance for [employer]. ?

## 2022-02-17 NOTE — Telephone Encounter (Signed)
Pt spoke with her insurance company. And she has fallen into a gap and has to cover 25% of the cost. Pt was given information for two other programs to possibility get assistant through them. She will call them then reach back out to [place] top give [place] an update. ?

## 2022-02-17 NOTE — Telephone Encounter (Signed)
Error

## 2022-02-17 NOTE — Telephone Encounter (Signed)
Patient states she was calling back with info concerning insurance  ?

## 2022-02-19 ENCOUNTER — Ambulatory Visit (INDEPENDENT_AMBULATORY_CARE_PROVIDER_SITE_OTHER): Payer: Medicare Other | Admitting: Cardiology

## 2022-02-19 ENCOUNTER — Telehealth: Payer: Self-pay

## 2022-02-19 ENCOUNTER — Other Ambulatory Visit: Payer: Self-pay

## 2022-02-19 ENCOUNTER — Encounter: Payer: Self-pay | Admitting: Cardiology

## 2022-02-19 VITALS — BP 110/62 | HR 90 | Ht 61.0 in | Wt 196.4 lb

## 2022-02-19 DIAGNOSIS — I739 Peripheral vascular disease, unspecified: Secondary | ICD-10-CM | POA: Diagnosis not present

## 2022-02-19 DIAGNOSIS — R0989 Other specified symptoms and signs involving the circulatory and respiratory systems: Secondary | ICD-10-CM

## 2022-02-19 DIAGNOSIS — E1122 Type 2 diabetes mellitus with diabetic chronic kidney disease: Secondary | ICD-10-CM

## 2022-02-19 DIAGNOSIS — I110 Hypertensive heart disease with heart failure: Secondary | ICD-10-CM

## 2022-02-19 DIAGNOSIS — E669 Obesity, unspecified: Secondary | ICD-10-CM

## 2022-02-19 DIAGNOSIS — I2510 Atherosclerotic heart disease of native coronary artery without angina pectoris: Secondary | ICD-10-CM | POA: Diagnosis not present

## 2022-02-19 DIAGNOSIS — N1832 Chronic kidney disease, stage 3b: Secondary | ICD-10-CM

## 2022-02-19 DIAGNOSIS — Z95810 Presence of automatic (implantable) cardiac defibrillator: Secondary | ICD-10-CM

## 2022-02-19 DIAGNOSIS — Z794 Long term (current) use of insulin: Secondary | ICD-10-CM

## 2022-02-19 MED ORDER — RANOLAZINE ER 500 MG PO TB12: 500.0000 mg | ORAL_TABLET | Freq: Two times a day (BID) | ORAL | 3 refills | Status: DC

## 2022-02-19 NOTE — Telephone Encounter (Signed)
Called pt to see if she wanted to come in sooner. No answer, left a message for her to return the call.  ?

## 2022-02-19 NOTE — Patient Instructions (Signed)
Medication Instructions:  ?Your physician has recommended you make the following change in your medication:  ?START: Ranexa 500 mg twice daily ?*If you need a refill on your cardiac medications before your next appointment, please call your pharmacy* ? ? ?Lab Work: ?None ?If you have labs (blood work) drawn today and your tests are completely normal, you will receive your results only by: ?MyChart Message (if you have MyChart) OR ?A paper copy in the mail ?If you have any lab test that is abnormal or we need to change your treatment, we will call you to review the results. ? ? ?Testing/Procedures: ?None ? ? ?Follow-Up: ?At [facility], you and your health needs are our priority.  As part of our continuing mission to provide you with exceptional heart care, we have created designated Provider Care Teams.  These Care Teams include your primary Cardiologist (physician) and Advanced Practice Providers (APPs -  Physician Assistants and Nurse Practitioners) who all work together to provide you with the care you need, when you need it. ? ?We recommend signing up for the patient portal called "MyChart".  Sign up information is provided on this After Visit Summary.  MyChart is used to connect with patients for Virtual Visits (Telemedicine).  Patients are able to view lab/test results, encounter notes, upcoming appointments, etc.  Non-urgent messages can be sent to your provider as well.   ?To learn more about what you can do with MyChart, go to [email or web address].   ? ?Your next appointment:   ?4 month(s) ? ?The format for your next appointment:   ?In Person ? ?Provider:   ?Thomasene Ripple, DO   ? ? ?Other Instructions ?  ?

## 2022-02-19 NOTE — Progress Notes (Signed)
?Cardiology Office Note:   ? ?Date:  02/20/2022  ? ?ID:  Karen Warner, DOB 10-Mar-1951, MRN [id number] ? ?PCP:  Leane Call, PA-C  ?Cardiologist:  Thomasene Ripple, DO  ?Electrophysiologist:  None  ? ?Referring MD: Leane Call, PA-C  ? ?" I am doing ok now" ? ?History of Present Illness:   ? ?Karen Warner is a 71 y.o. female with a hx of  coronary artery disease status post PCI, ischemic cardiomyopathy status post BiV pacemaker during her hospitalization in January 2022 presented for syncope episode, heart failure reduced ejection fraction recent EF 20 to 25% on her echo which was done in January 2022, sleep apnea is here today for follow-up visit. ?  ?I saw the patient most recently on January 16, 2021 at that time she had had a fall and was concerned about information on her device.  We reviewed her device check which was normal.  She did have some bilateral leg edema but was still at her dry weight.  Therefore her diuretics was not changed. ?  ?At her visit on January 16, 2021 we continued her torsemide which was every other day.  In terms of her ischemic cardiomyopathy we kept the Entresto, Coreg with no changes.  She was off Aldactone but her blood pressure did not tolerate starting this medication. ? ?  ?On March 15, 2021 she appeared to be doing well from a cardiovascular standpoint.  She was still waiting for her CPAP titration study. ?  ?I saw the patient on July 16, 2021 at that time she was post hospitalization from [facility] for heart failure exacerbation.  During that visit I started the patient on Verquvo 2.5mg  daily with the intention to titrate up to the maximum tolerable dose.  Continue her other guideline medical therapy.  Therapy. ? ?I saw the patient September 17, 2021 At that time she has some increasing edema I increased her torsemide.  ? ?I saw the patient on January 21, 2022 at that time she was posthospitalization at [facility] where she  underwent a left heart catheterization with no indication for PCI. ? ?During that visit she was doing well.  No anginal symptoms.  She also was euvolemic. ? ?Since her last visit she was admitted to [facility] for chest pain.  At that time she was started on Nitropaste.  She was discharged home.  During that visit at rate of hospital she was seen by Dr. Dulce Sellar no testing was done which I agree with. ?  ? ?Past Medical History:  ?Diagnosis Date  ? Anemia 12/12/2019  ? Arthralgia 01/14/2020  ? Arthritis   ? Asthma   ? Atherosclerotic heart disease of native coronary artery without angina pectoris 04/13/2014  ? CHF (congestive heart failure) (HCC)   ? Chronic bilateral low back pain with bilateral sciatica 01/13/2019  ? Chronic bladder pain 11/08/2014  ? Last Assessment & Plan:  Formatting of this note might be different from the original. Patient has chronic pain syndrome in general I think this is unfortunately worsening her pelvic pain and bladder pain her examination today was very reassuring I am advised her to use the estrogen cream I did start her on Elavil 10 milligrams at that time if she does tolerated will increase it to 25 milligrams.   ? Chronic headaches   ? Chronic idiopathic constipation 12/22/2014  ? Formatting of this note might be different from the original. Last Assessment & Plan:  For  better bowel emptying please use either Citrucel / Benefiber start with 2 tablespoons daily and titrate up or down to effect.  Also please use glycerin suppositories as needed to assist in evacuation. Last Assessment & Plan:  Formatting of this note might be different from the original. For better bowel empt  ? Chronic obstructive pulmonary disease, unspecified (HCC) 03/18/2018  ? Class 1 obesity due to excess calories with serious comorbidity and body mass index (BMI) of 31.0 to 31.9 in adult 06/26/2020  ? Colon polyps   ? COPD (chronic obstructive pulmonary disease) (HCC)   ? Coronary artery disease   ? DDD  (degenerative disc disease), cervical 01/13/2019  ? Depression 11/05/2019  ? Diabetic polyneuropathy associated with type 2 diabetes mellitus (HCC) 02/24/2019  ? Diverticulitis 05/20/2018  ? Diverticulitis of colon 03/18/2018  ? Family history of ischemic heart disease (IHD) 08/16/2013  ? Fibromyalgia affecting multiple sites 01/14/2020  ? Gastro-esophageal reflux disease without esophagitis 01/13/2019  ? Glaucoma 11/08/2014  ? Hordeolum externum of left upper eyelid 03/13/2020  ? Hypertension 11/08/2014  ? IBS (irritable bowel syndrome)   ? Iron deficiency anemia 01/13/2019  ? Left renal mass 11/08/2019  ? Leukocytosis 05/28/2018  ? Low vitamin B12 level 03/13/2020  ? Low vitamin D level 12/12/2019  ? LV dysfunction 05/31/2019  ? Malignant essential hypertension 01/22/2016  ? Migraine, unspecified, not intractable, without status migrainosus 11/08/2014  ? Mixed hyperlipidemia 01/13/2019  ? Myocardial infarction [facility])   ? Other premature beats 11/08/2014  ? 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 PVD (peripheral vascular disease) (HCC) 04/08/2017  ? 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 Trochanteric bursitis of right hip 04/20/2019  ? Type 2 diabetes mellitus without complications (HCC) 12/08/2013  ? Vaginal atrophy 11/08/2014  ? Formatting of this note might be different from the original. Last Assessment & Plan:  For vaginal atrophy please place a pea size dab of Estrogen vaginal cream  into the vagina 3 times a week ( Monday, Wednesday, Friday) Last Assessment & Plan:  Formatting of this note might be different from the original. For vaginal atrophy please place a pea size dab of Estrogen vaginal cream  into the vagina   ? ? ?Past Surgical History:  ?Procedure Laterality Date  ? ABDOMINAL HYSTERECTOMY    ? BIV ICD INSERTION CRT-D N/A 12/31/2020  ? Procedure: BIV ICD INSERTION CRT-D;  Surgeon: Marinus Maw, MD;  Location: [facility] INVASIVE CV LAB;  Service: Cardiovascular;  Laterality: N/A;  ?  BLEPHAROPLASTY Bilateral   ? COLON SURGERY    ? CORONARY ANGIOPLASTY WITH STENT PLACEMENT  2020  ? X3  ? CORONARY STENT INTERVENTION N/A 08/03/2020  ? Procedure: CORONARY STENT INTERVENTION;  Surgeon: Tonny Bollman, MD;  Location: [facility] INVASIVE CV LAB;  Service: Cardiovascular;  Laterality: N/A;  ? INTRAVASCULAR PRESSURE WIRE/FFR STUDY N/A 08/03/2020  ? Procedure: INTRAVASCULAR PRESSURE WIRE/FFR STUDY;  Surgeon: Tonny Bollman, MD;  Location: [facility] INVASIVE CV LAB;  Service: Cardiovascular;  Laterality: N/A;  ? LEFT HEART CATH AND CORONARY ANGIOGRAPHY N/A 08/03/2020  ? Procedure: LEFT HEART CATH AND CORONARY ANGIOGRAPHY;  Surgeon: Tonny Bollman, MD;  Location: [facility] INVASIVE CV LAB;  Service: Cardiovascular;  Laterality: N/A;  ? REFRACTIVE SURGERY Left   ? [place]  ? ? ?Current Medications: ?Current Meds  ?Medication Sig  ? acetaminophen (TYLENOL) 325 MG tablet Take 2 tablets by mouth every 6 (six) hours as needed for pain.  ? ACETAMINOPHEN-BUTALBITAL 50-325 MG TABS Take  1 tablet by mouth every 4 (four) hours as needed for severe pain.  ? albuterol (PROVENTIL) (2.5 MG/3ML) 0.083% nebulizer solution INL CONTENTS OF 1 VIAL VIA NEBULIZER Q 4 H PRF WHEEZING OR COUGH  ? albuterol (VENTOLIN HFA) 108 (90 Base) MCG/ACT inhaler Inhale 2 puffs into the lungs every 6 (six) hours as needed for shortness of breath or wheezing.  ? aspirin 81 MG EC tablet 81 mg daily.  ? baclofen (LIORESAL) 10 MG tablet Take 10 mg by mouth 2 (two) times daily.  ? carvedilol (COREG) 3.125 MG tablet TAKE 1 TABLET(3.125 MG) BY MOUTH TWICE DAILY WITH A MEAL  ? clopidogrel (PLAVIX) 75 MG tablet Take 1 tablet (75 mg total) by mouth daily.  ? diclofenac Sodium (VOLTAREN) 1 % GEL Apply 2 g topically 2 (two) times daily as needed (pain).  ? dicyclomine (BENTYL) 10 MG capsule Take 1 capsule (10 mg total) by mouth in the morning and at bedtime.  ? famotidine (PEPCID) 40 MG tablet Take 40 mg by mouth 3 (three) times daily.  ? FARXIGA 10 MG TABS tablet TAKE 1  TABLET(10 MG) BY MOUTH DAILY BEFORE BREAKFAST  ? ferrous sulfate 324 MG TBEC Take 324 mg by mouth daily with breakfast.  ? fluticasone (FLONASE) 50 MCG/ACT nasal spray Place 1 spray into both nostrils as needed for allergies o

## 2022-02-20 ENCOUNTER — Encounter: Payer: Self-pay | Admitting: Nurse Practitioner

## 2022-02-20 ENCOUNTER — Ambulatory Visit (INDEPENDENT_AMBULATORY_CARE_PROVIDER_SITE_OTHER): Payer: Medicare Other | Admitting: Nurse Practitioner

## 2022-02-20 VITALS — BP 118/60 | HR 91 | Ht 65.0 in | Wt 198.0 lb

## 2022-02-20 DIAGNOSIS — R140 Abdominal distension (gaseous): Secondary | ICD-10-CM

## 2022-02-20 DIAGNOSIS — R159 Full incontinence of feces: Secondary | ICD-10-CM | POA: Diagnosis not present

## 2022-02-20 DIAGNOSIS — R1030 Lower abdominal pain, unspecified: Secondary | ICD-10-CM | POA: Diagnosis not present

## 2022-02-20 DIAGNOSIS — I255 Ischemic cardiomyopathy: Secondary | ICD-10-CM

## 2022-02-20 MED ORDER — GLYCERIN (ADULT) 2 G RE SUPP: 1.0000 | RECTAL | 0 refills | Status: DC | PRN

## 2022-02-20 NOTE — Patient Instructions (Addendum)
If you are age 72 or older, your body mass index should be between 23-30. Your Body mass index is 32.95 kg/m?Marland Kitchen If this is out of the aforementioned range listed, please consider follow up with your Primary Care Provider. ? ?If you are age 73 or younger, your body mass index should be between 19-25. Your Body mass index is 32.95 kg/m?Marland Kitchen If this is out of the aformentioned range listed, please consider follow up with your Primary Care Provider.  ? ?________________________________________________________ ? ?The [facility] providers would like to encourage you to use [facility] to communicate with providers for non-urgent requests or questions.  Due to long hold times on the telephone, sending your provider a message by [facility] may be a faster and more efficient way to get a response.  Please allow 48 business hours for a response.  Please remember that this is for non-urgent requests.  ?_______________________________________________________ ? ?DISCONTINUE: Fiber ? ?Please purchase the following medications over the counter and take as directed: ? ?START: glycerin suppository apply after bowel movements. ? ?You are scheduled to follow up in the office on 03-20-22 at 9:00am. ? ?You have been referred to Pelvic Floor Therapy.  Someone should contact you regarding an appointment. ? ?Thank you for entrusting me with your care and choosing [facility]. ? ?Zerita Boers, NP ?

## 2022-02-20 NOTE — Progress Notes (Signed)
? ? ?Assessment  ?History of diverticulitis s/p colon resection in 2019 ?History of epiploic appendagitis ?Excessive flatus and bloating.  ?Chronic intermittent lower abdominal pain - unclear etiology but possibly related to adhesions or maybe musculoskeletal? Of note a non-contrast CT scan in July 2022 showed a possible segment of small bowel wall thickening in right paramidline abdomen just below the umbilicus.  ?History of colon polyps- follow up colonoscopy due in 2025.   ? ? ?Plan:   ?Possible reasons for bloating discussed such as food intolerances / SIBO. A list of low gas foods was given ?Recently started Fiber to help with fecal leakage associated with passage of flatus. Since she hasn't noticed any improvement in symptoms will stop Fiber because it can exacerbate bloating.  ?If no improvement with above consider testing for or treating empirically for SIBO ?Regarding fecal leakage, possible causes were discussed including internal hemorrhoid, incomplete rectal emptying, decreased sphincter tone. Recommend she use glycerin supp after BM to  facilitate rectal emptying. If still no improvement then consider referral to pelvic floor PT.  ?Return to see me in a few weeks. If bloating better but pain persists then consider follow up CT AP.  ? ? ?History of Present Illness  ? ?Patient Profile:  ?Karen Warner is a 71 y.o. 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  Additional medical history as listed in Karen Warner .  ? ?Chief Complaint : follow up on fecal leakage, abdominal pain also excessive bloating and flatus.   ? ?Karen Warner saw Karen Warner, [place] in July 2022 and again in January  2023, this is my first time seeing here. Karen Warner has been following her for upper and lower abdominal pain, bloating, gas and fecal leakage.  ? ?At her last visit here in January 2023 for fecal leakage she was given a course of steroid suppositories for internal hemorrhoids and started on daily fiber . Fiber hasn't changed her bowel movements in any way, Stools are soft but formed and she goes 2-3 times a day after meals. Sometimes has sensation of incomplete rectal emptying. She continues pass a small amount of small stool with flatus.  No urinary incontinence.  ? ?Also, she wants to talk about the excessive gas / bloating and lower abdominal pain that she has been having for months. Feels bloated all the time so it isn't related to eating. No nausea or vomiting. Gasx nor Tums help.  Passage of flatus doesn't help the bloating. She has   ?chronic generalized lower abdominal pain unrelieved with defecation. The pain is not positional. Her lower abdominal incision from colon resection ( 2019) is still sore.  ? ?Patient had a non-contrast CT scan in July 2022 after seeing Karen Warner in the office.  Findings included left kidney lesions ( likely cysts). Possible segment of small bowel wall thickening in right paramidline abdomen just below the umbilicus.  ? ?Laboratory data: ? ?  Latest Ref Rng & Units 02/13/2022  ? 12:00 AM 08/16/2021  ? 12:00 AM 02/14/2021  ? 12:00 AM  ?Hepatic Function  ?Albumin 3.5 - 5.0 4.0      3.9   4.2       ?AST 13 - 35 25      26  31       ?ALT 7 - 35 U/L 15      13   15        ?Alk Phosphatase 25 - 125 74      76   72       ?  ? This result is from an external source.  ? ? ? ?  Latest Ref Rng & Units 02/13/2022  ? 12:00 AM 08/16/2021  ? 12:00 AM 02/14/2021  ? 12:00 AM  ?CBC  ?WBC  6.5      6.4   4.9       ?Hemoglobin 12.0 - 16.0 12.5      12.8   12.7       ?Hematocrit 36 - 46 40      38   39       ?Platelets 150 - 400 K/uL 276      250   234       ?  ? This result is from an external source.   ? ? ?Previous GI Evaluations  ? ?Endoscopies:  ? ?EGD October 25, 2018 showed gastritis, hiatal hernia.  Gastric biopsies negative for H. Pylori ?  ?Small bowel video capsule October 07, 2018: AVM of the proximal jejunum, nonbleeding ? ?Colonoscopy October 07, 2019: 3 diminutive tubular adenomas removed, few scattered diverticula proximal to the anastomosis, left hemicolectomy for diverticular disease. Recall colonoscopy 5 years recommended ? ? ?Past Medical History:  ?Diagnosis Date  ? Anemia 12/12/2019  ? Arthralgia 01/14/2020  ? Arthritis   ? Asthma   ? Atherosclerotic heart disease of native coronary artery without angina pectoris 04/13/2014  ? CHF (congestive heart failure) (Karen Warner)   ? Chronic bilateral low back pain with bilateral sciatica 01/13/2019  ? Chronic bladder pain 11/08/2014  ? Last Assessment & Plan:  Formatting of this note might be different from the original. Patient has chronic pain syndrome in general I think this is unfortunately worsening her pelvic pain and bladder pain her examination today was very reassuring I am advised her to use the estrogen cream I did start her on Elavil 10 milligrams at that time if she does tolerated will increase it to 25 milligrams.   ? Chronic headaches   ? Chronic idiopathic constipation 12/22/2014  ? Formatting of this note might be different from the original. Last Assessment & Plan:  For better bowel emptying please use either Citrucel / Benefiber start with 2 tablespoons daily and titrate up or down to effect.  Also please use glycerin suppositories as needed to assist in evacuation. Last Assessment & Plan:  Formatting of this note might be different from the original. For better bowel empt  ? Chronic obstructive pulmonary disease, unspecified (Karen Warner) 03/18/2018  ? Class 1 obesity due to excess calories with serious comorbidity and body mass index (BMI) of 31.0 to 31.9 in adult 06/26/2020  ? Colon polyps   ? COPD (chronic obstructive pulmonary disease) (Karen Warner)   ?  Coronary artery disease   ? DDD (degenerative disc disease), cervical 01/13/2019  ? Depression 11/05/2019  ? Diabetic polyneuropathy associated with type 2 diabetes mellitus ([place]) 02/24/2019  ? Diverticulitis 05/20/2018  ? Diverticulitis of colon 03/18/2018  ? Family history of ischemic heart disease (IHD) 08/16/2013  ? Fibromyalgia affecting multiple sites 01/14/2020  ? Gastro-esophageal reflux disease without esophagitis 01/13/2019  ? Glaucoma 11/08/2014  ? Hordeolum externum of left upper eyelid 03/13/2020  ? Hypertension 11/08/2014  ? IBS (irritable bowel syndrome)   ?  Iron deficiency anemia 01/13/2019  ? Left renal mass 11/08/2019  ? Leukocytosis 05/28/2018  ? Low vitamin B12 level 03/13/2020  ? Low vitamin D level 12/12/2019  ? LV dysfunction 05/31/2019  ? Malignant essential hypertension 01/22/2016  ? Migraine, unspecified, not intractable, without status migrainosus 11/08/2014  ? Mixed hyperlipidemia 01/13/2019  ? Myocardial infarction [facility])   ? Other premature beats 11/08/2014  ? 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 PVD (peripheral vascular disease) ([place]) 04/08/2017  ? 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 Trochanteric bursitis of right hip 04/20/2019  ? Type 2 diabetes mellitus without complications ([place]) 04/07/5276  ? Vaginal atrophy 11/08/2014  ? Formatting of this note might be different from the original. Last Assessment & Plan:  For vaginal atrophy please place a pea size dab of Estrogen vaginal cream  into the vagina 3 times a week ( Monday, Wednesday, Friday) Last Assessment & Plan:  Formatting of this note might be different from the original. For vaginal atrophy please place a pea size dab of Estrogen vaginal cream  into the vagina   ? ? ?Past Surgical History:  ?Procedure Laterality Date  ? ABDOMINAL HYSTERECTOMY    ? BIV ICD INSERTION CRT-D N/A 12/31/2020  ? Procedure: BIV ICD INSERTION CRT-D;  Surgeon: Evans Lance, MD;  Location: [place] [facility];  Service: Cardiovascular;   Laterality: N/A;  ? BLEPHAROPLASTY Bilateral   ? COLON SURGERY    ? CORONARY ANGIOPLASTY WITH STENT PLACEMENT  2020  ? X3  ? CORONARY STENT INTERVENTION N/A 08/03/2020  ? Procedure: CORONARY STENT INTERVENTION;

## 2022-02-24 ENCOUNTER — Telehealth: Payer: Self-pay | Admitting: Cardiology

## 2022-02-24 NOTE — Telephone Encounter (Signed)
Patient calling to speak with Leavy Cella, states it is in regards to a patient assistance application. Cannot remember the name of the medication. Please advise.  ?

## 2022-02-24 NOTE — Telephone Encounter (Signed)
Spoke with pt regarding pt assistance. Pt is currently in the donut hole and found out that the [employer] foundation could help her with drug cost as long as they have a diagnosis code. [employer] foundation number is [phone]. Will forward to Dr. Harriet Masson and primary nurse. Pt verbalizes understanding. ?

## 2022-02-25 NOTE — Telephone Encounter (Signed)
Called the [employer] foundation. Spoke with Erie Noe she asked for the ICD-10 code. I gave her the code for heart failure since that is the reason the pt is prescribed the medication. She states the pt has not applied to the program yet and can apply over the phone. Will call the pt to let her know to call [employer] and enroll in the program.  ?

## 2022-02-25 NOTE — Telephone Encounter (Signed)
Called pt to let her know to call [employer] and apply for the program over the phone. She verbalized understanding and thanked me for calling her.  ?

## 2022-02-26 ENCOUNTER — Ambulatory Visit: Payer: Medicare Other | Admitting: Cardiology

## 2022-02-27 NOTE — Progress Notes (Signed)
Reviewed and agree with documentation and assessment and plan. Karen Warner , MD   

## 2022-03-08 ENCOUNTER — Other Ambulatory Visit: Payer: Self-pay | Admitting: Cardiology

## 2022-03-20 ENCOUNTER — Telehealth: Payer: Self-pay | Admitting: Cardiology

## 2022-03-20 ENCOUNTER — Other Ambulatory Visit: Payer: Self-pay

## 2022-03-20 ENCOUNTER — Encounter: Payer: Self-pay | Admitting: Nurse Practitioner

## 2022-03-20 ENCOUNTER — Ambulatory Visit (INDEPENDENT_AMBULATORY_CARE_PROVIDER_SITE_OTHER): Payer: Medicare Other | Admitting: Nurse Practitioner

## 2022-03-20 VITALS — BP 120/70 | HR 80 | Ht 65.0 in | Wt 200.0 lb

## 2022-03-20 DIAGNOSIS — R140 Abdominal distension (gaseous): Secondary | ICD-10-CM | POA: Diagnosis not present

## 2022-03-20 DIAGNOSIS — R143 Flatulence: Secondary | ICD-10-CM | POA: Diagnosis not present

## 2022-03-20 DIAGNOSIS — I255 Ischemic cardiomyopathy: Secondary | ICD-10-CM | POA: Diagnosis not present

## 2022-03-20 MED ORDER — VERQUVO 5 MG PO TABS: 5.0000 mg | ORAL_TABLET | Freq: Every day | ORAL | 3 refills | Status: DC

## 2022-03-20 NOTE — Telephone Encounter (Signed)
Called [employer] spoke with Karen Warner, he states the application was denied due to the date being longer than 90 days. Will correct form and mail to refax it.  ?

## 2022-03-20 NOTE — Patient Instructions (Signed)
If you are age 72 or older, your body mass index should be between 23-30. Your Body mass index is 33.28 kg/m?Marland Kitchen If this is out of the aforementioned range listed, please consider follow up with your Primary Care Provider. ?________________________________________________________ ? ?The [place] [facility] providers would like to encourage you to use [facility] to communicate with providers for non-urgent requests or questions.  Due to long hold times on the telephone, sending your provider a message by [facility] may be a faster and more efficient way to get a response.  Please allow 48 business hours for a response.  Please remember that this is for non-urgent requests.  ?_______________________________________________________ ? ?Stop artifical sweeteners including diet drinks.  ?Call with a condition update in 4 weeks. ? ?Thank you for entrusting me with your care and choosing [facility]. ? ?Willette Cluster, NP ?

## 2022-03-20 NOTE — Telephone Encounter (Signed)
Faxed updated form to [employer].  ?

## 2022-03-20 NOTE — Progress Notes (Signed)
Prescription printed for pt assistance form.  ?

## 2022-03-20 NOTE — Progress Notes (Signed)
? ? ? ?Assessment  ? ?Patient Profile:  ?Karen Warner is a 71 y.o. female known to Dr. Silverio Decamp with a past medical history of hypertension, hyperlipidemia, coronary artery disease s/p stent on plavix, ischemic cardiomyopathy with severe CHF s/p biventricular ICD placement 12/2020, DM, IDA intestinal AVMs, diverticular disease, s/p left hemicolectomy,  epiploic appendicitis, colon polyps and GERD, hiatal hernia, gastritis.  Additional medical history as listed in [place] . ? ?Bloating / flatus. Stopping fiber didn't help so she has resumed it . Could have SIBO with diabetes as one of the risk factors. However she is still consuming dairy which she knows exacerbates symptoms. Also using artificial sweeteners which is known to increased bloating / gas.  ? ?GERD, asymptomatic on daily PPI and BID Pepcid ? ?Plan  ? ?Stop artifical sweeteners and diet drinks. Given her a low FODMAP diet . Maybe she can identify potential culprits. Holding off on SIBO testing for now  ?Call with a condition update in 4 weeks, it not improved the further dietary modifications will test for SIBO ? ? ?History of Present Illness  ? ?Chief Complaint :  bloating , excessive gas ? ?Karen Warner is here for a 4-week follow-up.  I saw her 02/20/2022 for excessive bloating and gas, chronic abdominal pain.  Fiber was stopped with the thought that it could be exacerbating her bloating. If symptoms persisted we would consider testing for SIBO.  She was given a list of low gas foods.  For fecal leakage she was advised to try glycerin suppositories after bowel movement to facilitate rectal emptying.  The cause of her abdominal pain was unclear, possibly musculoskeletal or adhesions from prior surgery.  Prior to seeing me patient had seen [name], P.A with similar symptoms.  A non-contrast CT scan in July 2022 showed a possible segment of small bowel wall thickening in right paramidline abdomen just below the umbilicus. Please refer to  that 02/20/2022 office note for further details.  ? ?INTERVAL HISTORY ?She stopped fiber for 1.5 weeks and bloating / gas didn't improve so she restarted it back yesterday. She is having 1-2 solid BMs a day so constipation doesn't seem to be a factor in the bloating.  Because of the excessive flatulence she does not want to leave the house. She stopped eating broccoli. Not eating beans anymore. She has noticed that both white and sweet potatoes increased gas so tries to avoid those.  She knows that diary increases the bloating and gas but still consumes it especially in the form of ice creams.  Tries to substitute fruit for ice cream as much as possible. Uses Truvia sweetener. She drinks diet drinks.  ? ?Laboratory data: ? ?  Latest Ref Rng & Units 02/13/2022  ? 12:00 AM 08/16/2021  ? 12:00 AM 02/14/2021  ? 12:00 AM  ?Hepatic Function  ?Albumin 3.5 - 5.0 4.0      3.9   4.2       ?AST 13 - 35 _0 ?ALT 7 - 35 U/L _1 ?Alk Phosphatase 25 - 125 74      76   72       ?  ? This result is from an external source.  ? ? ?Previous GI Evaluations  ? ?Endoscopies:  ? ?EGD October 25, 2018 showed gastritis, hiatal hernia.  Gastric biopsies negative for H. Pylori ?  ?Small bowel video capsule October 07, 2018: AVM of the proximal jejunum, nonbleeding ?  ?Colonoscopy October 07, 2019: 3 diminutive tubular adenomas removed, few scattered diverticula proximal to the anastomosis, left hemicolectomy for diverticular disease. Recall colonoscopy 5 years recommended ? ? ?Imaging:  ?July 2022 CTAP wo contrast ?IMPRESSION: ?1. Possible segmental small bowel wall thickening in the ventral right paramidline abdomen, just above the umbilicus. This area is under distended, however. Otherwise, no findings to explain the ?patient's right upper quadrant pain. ?2. Stool in the majority of the colon is indicative of constipation. ?3.  Aortic atherosclerosis (ICD10-I70.0). ?  ? ? ?Past Medical History:  ?Diagnosis  Date  ? Anemia 12/12/2019  ? Arthralgia 01/14/2020  ? Arthritis   ? Asthma   ? Atherosclerotic heart disease of native coronary artery without angina pectoris 04/13/2014  ? CHF (congestive heart failure) ([place])   ? Chronic bilateral low back pain with bilateral sciatica 01/13/2019  ? Chronic bladder pain 11/08/2014  ? Last Assessment & Plan:  Formatting of this note might be different from the original. Patient has chronic pain syndrome in general I think this is unfortunately worsening her pelvic pain and bladder pain her examination today was very reassuring I am advised her to use the estrogen cream I did start her on Elavil 10 milligrams at that time if she does tolerated will increase it to 25 milligrams.   ? Chronic headaches   ? Chronic idiopathic constipation 12/22/2014  ? Formatting of this note might be different from the original. Last Assessment & Plan:  For better bowel emptying please use either Citrucel / Benefiber start with 2 tablespoons daily and titrate up or down to effect.  Also please use glycerin suppositories as needed to assist in evacuation. Last Assessment & Plan:  Formatting of this note might be different from the original. For better bowel empt  ? Chronic obstructive pulmonary disease, unspecified ([place]) 03/18/2018  ? Class 1 obesity due to excess calories with serious comorbidity and body mass index (BMI) of 31.0 to 31.9 in adult 06/26/2020  ? Colon polyps   ? COPD (chronic obstructive pulmonary disease) ([place])   ? Coronary artery disease   ? DDD (degenerative disc disease), cervical 01/13/2019  ? Depression 11/05/2019  ? Diabetic polyneuropathy associated with type 2 diabetes mellitus ([place]) 02/24/2019  ? Diverticulitis 05/20/2018  ? Diverticulitis of colon 03/18/2018  ? Family history of ischemic heart disease (IHD) 08/16/2013  ? Fibromyalgia affecting multiple sites 01/14/2020  ? Gastro-esophageal reflux disease without esophagitis 01/13/2019  ? Glaucoma 11/08/2014  ? Hordeolum externum of left upper  eyelid 03/13/2020  ? Hypertension 11/08/2014  ? IBS (irritable bowel syndrome)   ? Iron deficiency anemia 01/13/2019  ? Left renal mass 11/08/2019  ? Leukocytosis 05/28/2018  ? Low vitamin B12 level 03/13/2020  ? Low vitamin D level 12/12/2019  ? LV dysfunction 05/31/2019  ? Malignant essential hypertension 01/22/2016  ? Migraine, unspecified, not intractable, without status migrainosus 11/08/2014  ? Mixed hyperlipidemia 01/13/2019  ? Myocardial infarction [facility])   ? Other premature beats 11/08/2014  ? 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 PVD (peripheral vascular disease) ([place]) 04/08/2017  ? 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 Trochanteric bursitis of right hip 04/20/2019  ? Type 2 diabetes mellitus without complications ([place]) 02/07/7672  ? Vaginal atrophy 11/08/2014  ? Formatting of this note might be different from the original. Last Assessment & Plan:  For vaginal atrophy please place a pea size dab of Estrogen vaginal cream  into the vagina 3 times a week ( Monday, Wednesday, Friday) Last Assessment & Plan:  Formatting of this note might be different from the original. For vaginal atrophy please place a pea size dab of Estrogen vaginal cream  into the vagina   ? ? ?Past Surgical History:  ?Procedure Laterality Date  ? ABDOMINAL HYSTERECTOMY    ? BIV ICD INSERTION CRT-D N/A 12/31/2020  ? Procedure: BIV ICD INSERTION CRT-D;  Surgeon: Evans Lance, MD;  Location: [place] [facility];  Service: Cardiovascular;  Laterality: N/A;  ? BLEPHAROPLASTY Bilateral   ? COLON SURGERY    ? CORONARY ANGIOPLASTY WITH STENT PLACEMENT  2020  ? X3  ? CORONARY STENT INTERVENTION N/A 08/03/2020  ? Procedure: CORONARY STENT INTERVENTION;  Surgeon: Sherren Mocha, MD;  Location: [place] [facility];  Service: Cardiovascular;  Laterality: N/A;  ? INTRAVASCULAR PRESSURE WIRE/FFR STUDY N/A 08/03/2020  ? Procedure: INTRAVASCULAR PRESSURE WIRE/FFR STUDY;  Surgeon: Sherren Mocha, MD;  Location: [place] [facility];   Service: Cardiovascular;  Laterality: N/A;  ? LEFT HEART CATH AND CORONARY ANGIOGRAPHY N/A 08/03/2020  ? Procedure: LEFT HEART CATH AND CORONARY ANGIOGRAPHY;  Surgeon: Sherren Mocha, MD;  [facility]

## 2022-03-20 NOTE — Telephone Encounter (Signed)
Patient calling the office for samples of medication: ? ? ?1.  What medication and dosage are you requesting samples for? ? Vericiguat (VERQUVO) 5 MG TABS ? ?2.  Are you currently out of this medication?  ? ?No, patient states she has about 7 tablets remaining. Patient is also requesting to speak with Leavy Cella, RN regarding patient assistance for this medication. She states she has been approved (see 3/27 phone encounter). ? ?

## 2022-03-20 NOTE — Telephone Encounter (Signed)
Called pt to let her know this development about the patient assistance form.  After speaking with Hazeline Junker he states as along as they get the information today or tomorrow they can have the medication to the pt by Saturday. She verbalized understanding.  ?

## 2022-03-24 ENCOUNTER — Other Ambulatory Visit: Payer: Self-pay | Admitting: Cardiology

## 2022-03-25 NOTE — Telephone Encounter (Signed)
-  Pt called stating she still have not received her medication. ?-Nurse advised Leavy Cella is out of the office today, but will forward a message to contact her once she return.  ?-Pt verbalized understanding.  ?

## 2022-03-25 NOTE — Telephone Encounter (Signed)
Called [employer] to see why the pt still did not get her medication. Spoke with Shari Prows he states the pt's account was sent to patient assistance and I should speak to them to see why the pt has not received the medication (stated on the phone with Shari Prows for 18 minutes). Shari Prows transferred me to patient assistance. Spoke with Casimiro Needle, he states they did not receive the fax last week.  ?I asked a co-worker to fax the form to [phone] (number provided by Casimiro Needle) since I am out of the office today.  ?The call was 29 mins total.    ?

## 2022-03-25 NOTE — Telephone Encounter (Signed)
Patient called to say she still hasn't received her medication.  ?

## 2022-03-26 NOTE — Telephone Encounter (Signed)
Patient has been made aware that the forms were successfully faxed today. She only has a few days remaining.  ?

## 2022-03-26 NOTE — Telephone Encounter (Signed)
Patient called wanting to speak to [facility].  She states she still hasn't received her medication, she wants to know what she should do.   ?

## 2022-03-31 ENCOUNTER — Other Ambulatory Visit: Payer: Self-pay | Admitting: Cardiology

## 2022-04-01 ENCOUNTER — Telehealth: Payer: Self-pay | Admitting: Cardiology

## 2022-04-01 NOTE — Telephone Encounter (Signed)
Spoke with Alexus from [employer], she states the dx code was not valid for the pt to receive the medication. I provided corrected codes on the form and refaxed it. She states she will make note of that. When asked what number the form should be faxed to she states [phone], fax sent there.  ?

## 2022-04-01 NOTE — Telephone Encounter (Signed)
Patient states nobody got back in contact with her concerning the medication. Please advise ?

## 2022-04-01 NOTE — Telephone Encounter (Signed)
Patient id following up on application for assistance for verquvo 5 mg daily. There are no samples to provide at this time of 5 mg or 2.5 mg. ?

## 2022-04-04 ENCOUNTER — Ambulatory Visit (INDEPENDENT_AMBULATORY_CARE_PROVIDER_SITE_OTHER): Payer: Medicare Other

## 2022-04-04 DIAGNOSIS — I255 Ischemic cardiomyopathy: Secondary | ICD-10-CM | POA: Diagnosis not present

## 2022-04-04 LAB — CUP PACEART REMOTE DEVICE CHECK
Battery Remaining Longevity: 102 mo
Battery Remaining Percentage: 100 %
Brady Statistic RA Percent Paced: 0 %
Brady Statistic RV Percent Paced: 100 %
Date Time Interrogation Session: 20230505144200
HighPow Impedance: 83 Ohm
Implantable Lead Implant Date: 20220131
Implantable Lead Implant Date: 20220131
Implantable Lead Implant Date: 20220131
Implantable Lead Location: 753858
Implantable Lead Location: 753859
Implantable Lead Location: 753860
Implantable Lead Model: 137
Implantable Lead Model: 3830
Implantable Lead Model: 7840
Implantable Lead Serial Number: 1047234
Implantable Lead Serial Number: 301179
Implantable Pulse Generator Implant Date: 20220131
Lead Channel Impedance Value: 561 Ohm
Lead Channel Impedance Value: 566 Ohm
Lead Channel Impedance Value: 756 Ohm
Lead Channel Setting Pacing Amplitude: 0.1 V
Lead Channel Setting Pacing Amplitude: 3.5 V
Lead Channel Setting Pacing Amplitude: 3.5 V
Lead Channel Setting Pacing Pulse Width: 0.1 ms
Lead Channel Setting Pacing Pulse Width: 0.4 ms
Lead Channel Setting Sensing Sensitivity: 0.5 mV
Lead Channel Setting Sensing Sensitivity: 1 mV
Pulse Gen Serial Number: 149649

## 2022-04-11 ENCOUNTER — Telehealth: Payer: Self-pay | Admitting: Gastroenterology

## 2022-04-11 ENCOUNTER — Ambulatory Visit: Payer: Medicare Other | Admitting: Gastroenterology

## 2022-04-11 NOTE — Telephone Encounter (Signed)
Called pt to let her know I received a fax requesting she sign a attestation for [employer] to process her form. Pt verbalized understanding. She states she is in the hospital now de to stomach pain that would not stop. She states she has a small bowel obstruction and may have to have surgery. She states she will come sign the paperwork after she is released from the hospital. Will let Dr. Servando Salina know what is going on with the pt.  ?

## 2022-04-11 NOTE — Telephone Encounter (Signed)
Patient called tto cancel her appointment with Dr. Lavon Paganini his morning stating she could wait no longer and ended up going to [facility] yesterday where she was admitted.  They have a tube down her throat, have done x-rays and other testing, and have determined she has a small bowel obstruction.  They are doing another 8-hour dye test on her today to determine if she needs surgery.  She will call later to reschedule a follow up appointment with Dr. Lavon Paganini. ?

## 2022-04-11 NOTE — Telephone Encounter (Signed)
Thank you for the update!

## 2022-04-16 ENCOUNTER — Other Ambulatory Visit: Payer: Self-pay | Admitting: Nurse Practitioner

## 2022-04-17 NOTE — Progress Notes (Signed)
Remote ICD transmission.   

## 2022-04-22 ENCOUNTER — Encounter: Payer: Medicare Other | Attending: Nurse Practitioner | Admitting: Physical Therapy

## 2022-04-24 NOTE — Progress Notes (Signed)
Reviewed and agree with documentation and assessment and plan. K. Veena Remell Giaimo , MD   

## 2022-04-29 ENCOUNTER — Encounter: Payer: Medicare Other | Admitting: Physical Therapy

## 2022-04-29 NOTE — Therapy (Deleted)
OUTPATIENT PHYSICAL THERAPY FEMALE PELVIC EVALUATION   Patient Name: Karen Warner MRN: [id number] DOB:1951/05/14, 71 y.o., female Today's Date: 04/29/2022    Past Medical History:  Diagnosis Date   Anemia 12/12/2019   Arthralgia 01/14/2020   Arthritis    Asthma    Atherosclerotic heart disease of native coronary artery without angina pectoris 04/13/2014   CHF (congestive heart failure) (HCC)    Chronic bilateral low back pain with bilateral sciatica 01/13/2019   Chronic bladder pain 11/08/2014   Last Assessment & Plan:  Formatting of this note might be different from the original. Patient has chronic pain syndrome in general I think this is unfortunately worsening her pelvic pain and bladder pain her examination today was very reassuring I am advised her to use the estrogen cream I did start her on Elavil 10 milligrams at that time if she does tolerated will increase it to 25 milligrams.    Chronic headaches    Chronic idiopathic constipation 12/22/2014   Formatting of this note might be different from the original. Last Assessment & Plan:  For better bowel emptying please use either Citrucel / Benefiber start with 2 tablespoons daily and titrate up or down to effect.  Also please use glycerin suppositories as needed to assist in evacuation. Last Assessment & Plan:  Formatting of this note might be different from the original. For better bowel empt   Chronic obstructive pulmonary disease, unspecified ([place]) 03/18/2018   Class 1 obesity due to excess calories with serious comorbidity and body mass index (BMI) of 31.0 to 31.9 in adult 06/26/2020   Colon polyps    COPD (chronic obstructive pulmonary disease) (HCC)    Coronary artery disease    DDD (degenerative disc disease), cervical 01/13/2019   Depression 11/05/2019   Diabetic polyneuropathy associated with type 2 diabetes mellitus ([place]) 02/24/2019   Diverticulitis 05/20/2018   Diverticulitis of colon 03/18/2018   Family history of  ischemic heart disease (IHD) 08/16/2013   Fibromyalgia affecting multiple sites 01/14/2020   Gastro-esophageal reflux disease without esophagitis 01/13/2019   Glaucoma 11/08/2014   Hordeolum externum of left upper eyelid 03/13/2020   Hypertension 11/08/2014   IBS (irritable bowel syndrome)    Iron deficiency anemia 01/13/2019   Left renal mass 11/08/2019   Leukocytosis 05/28/2018   Low vitamin B12 level 03/13/2020   Low vitamin D level 12/12/2019   LV dysfunction 05/31/2019   Malignant essential hypertension 01/22/2016   Migraine, unspecified, not intractable, without status migrainosus 11/08/2014   Mixed hyperlipidemia 01/13/2019   Myocardial infarction ([place])    Other premature beats 11/08/2014   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 Last Assessment & Plan:  For vaginal atrophy please place a pea size dab of Estrogen vaginal cream  into the vagina 3 times a week ( Monday, Wednesday, Friday) Last Assessment & Plan:  Formatting of this note might be different from the original. For vaginal atrophy please place a pea size dab of Estrogen vaginal cream  into the vagina    Past Surgical History:  Procedure Laterality Date   ABDOMINAL HYSTERECTOMY     BIV ICD INSERTION CRT-D N/A 12/31/2020   Procedure: BIV ICD INSERTION CRT-D;  Surgeon: Evans Lance, MD;  Location: [facility];  Service: Cardiovascular;  Laterality: N/A;   BLEPHAROPLASTY Bilateral    COLON SURGERY     CORONARY ANGIOPLASTY WITH STENT PLACEMENT  2020   X3   CORONARY STENT INTERVENTION N/A 08/03/2020   Procedure: CORONARY STENT INTERVENTION;  Surgeon: Sherren Mocha, MD;  Location: [facility];   Service: Cardiovascular;  Laterality: N/A;   INTRAVASCULAR PRESSURE WIRE/FFR STUDY N/A 08/03/2020   Procedure: INTRAVASCULAR PRESSURE WIRE/FFR STUDY;  Surgeon: Sherren Mocha, MD;  Location: [facility];  Service: Cardiovascular;  Laterality: N/A;   LEFT HEART CATH AND CORONARY ANGIOGRAPHY N/A 08/03/2020   Procedure: LEFT HEART CATH AND CORONARY ANGIOGRAPHY;  Surgeon: Sherren Mocha, MD;  Location: [facility];  Service: Cardiovascular;  Laterality: N/A;   REFRACTIVE SURGERY Left    [place]   Patient Active Problem List   Diagnosis Date Noted   Biventricular ICD (implantable cardioverter-defibrillator) in place 04/03/2021   Obstructive sleep apnea 03/15/2021   COPD (chronic obstructive pulmonary disease) (HCC)    Colon polyps    Chronic headaches    CHF (congestive heart failure) (HCC)    Asthma    Arthritis    Anemia in chronic kidney disease 02/19/2021   IBS (irritable bowel syndrome)    Syncope and collapse 12/26/2020   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   Orthostatic hypotension 11/21/2020   Daytime sleepiness 11/20/2020   Hypertensive heart disease with heart failure (HCC) 11/17/2020   Syncope 11/17/2020   Herpes simplex vulvovaginitis 11/02/2020   Snoring 10/17/2020   Depressed left ventricular ejection fraction 10/16/2020   Left bundle branch block 10/16/2020   Nasal congestion 09/30/2020   Coronary artery disease    Myocardial infarction [facility])    Unstable angina ([place]) 08/03/2020   Ischemic cardiomyopathy 08/03/2020   Non-ST elevation (NSTEMI) myocardial infarction ([place]) 08/03/2020   Obesity (BMI 30-39.9) 06/26/2020   Pustular rash 05/14/2020   Torticollis, acute 04/11/2020   Hordeolum externum of left upper eyelid 03/13/2020   Low vitamin B12 level 03/13/2020   Pain of right great toe 01/24/2020   Arthralgia 01/14/2020   Fibromyalgia affecting multiple sites 01/14/2020   Diarrhea of presumed infectious origin 01/07/2020   Ingrown nail 12/27/2019    History of anemia 12/12/2019   Low hemoglobin 12/12/2019   Low vitamin D level 12/12/2019   Anemia 12/12/2019   Long-term use of aspirin therapy 11/15/2019   Left renal mass 11/08/2019   Depression 11/05/2019   Neuropathy 11/05/2019   Other fatigue 09/12/2019   Rhinosinusitis 09/12/2019   Hypertensive emergency 08/06/2019   Left ventricular dysfunction 05/31/2019   LV dysfunction 05/31/2019   Trochanteric bursitis of right hip 04/20/2019   Aftercare following surgery 04/12/2019   Injury of toenail of left foot 04/06/2019   Moderate episode of recurrent major depressive disorder ([place]) 03/29/2019   Diabetic polyneuropathy associated with type 2 diabetes mellitus ([place]) 02/24/2019   History of disseminated herpes simplex 02/22/2019   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 ([place]) [id number]   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 ([place]) 02/22/2019   Seasonal allergies 02/22/2019   Encounter to establish care 01/23/2019   Vitamin D deficiency 01/23/2019   Chronic bilateral low back pain with bilateral sciatica 01/13/2019   DDD (degenerative disc disease), cervical 01/13/2019   Gastro-esophageal reflux disease without esophagitis 01/13/2019   History of intestinal surgery 01/13/2019   Iron deficiency anemia 01/13/2019   Mixed hyperlipidemia 01/13/2019   Tobacco abuse 01/13/2019   Leukocytosis  05/28/2018   Diverticulitis 05/20/2018   Chronic obstructive pulmonary disease, unspecified ([place]) 03/18/2018   Diverticulitis of colon 03/18/2018   PVD (peripheral vascular disease) ([place]) 04/08/2017   Chest pain 04/07/2017   Malignant essential hypertension 01/22/2016   Lower abdominal pain 12/22/2014   Chronic idiopathic constipation 12/22/2014   Chronic bladder pain 11/08/2014   Glaucoma 11/08/2014   H/O vaginal hysterectomy 11/08/2014   Hypertension 11/08/2014   Migraine, unspecified, not intractable, without status migrainosus 11/08/2014   Nocturia 11/08/2014   Other premature beats  11/08/2014   Urge incontinence 11/08/2014   Urinary urgency 11/08/2014   Vaginal atrophy 11/08/2014   Atherosclerotic heart disease of native coronary artery without angina pectoris 04/13/2014   Type 2 diabetes mellitus with stage 3b chronic kidney disease, with long-term current use of insulin ([place]) 12/08/2013   Family history of ischemic heart disease (IHD) 08/16/2013   Ptosis 04/19/2012    PCP: Waldemar Dickens  REFERRING PROVIDER: Stacie Glaze., NP  REFERRING DIAG: R15.9 (ICD-10-CM) - Incontinence of feces, unspecified fecal incontinence type  THERAPY DIAG:  No diagnosis found.  Rationale for Evaluation and Treatment Rehabilitation  ONSET DATE: ***  SUBJECTIVE:                                                                                                                                                                                           SUBJECTIVE STATEMENT: *** Fluid intake: {Yes/No:304960894}   Patient confirms identification and approves PT to assess pelvic floor and treatment {yes/no:20286}   PAIN:  Are you having pain? {yes/no:20286} NPRS scale: ***/10 Pain location: {pelvic pain location:27098}  Pain type: {type:313116} Pain description: {PAIN DESCRIPTION:21022940}   Aggravating factors: *** Relieving factors: ***  PRECAUTIONS: {Therapy precautions:24002}  WEIGHT BEARING RESTRICTIONS {Yes ***/No:24003}  FALLS:  Has patient fallen in last 6 months? {fallsyesno:27318}  LIVING ENVIRONMENT: Lives with: {OPRC lives with:25569::"lives with their family"} Lives in: {Lives in:25570} Stairs: {opstairs:27293} Has following equipment at home: {Assistive devices:23999}  OCCUPATION: ***  PLOF: {PLOF:24004}  PATIENT GOALS ***  PERTINENT HISTORY:  CHF, Diabetes type 2, IBS, Abdominal Hysterectomy,  Sexual abuse: {Yes/No:304960894}  BOWEL MOVEMENT Pain with bowel movement: {yes/no:20286} Type of bowel movement:{PT BM type:27100} Fully  empty rectum: {Yes/No:304960894} Leakage: {Yes/No:304960894} Pads: {Yes/No:304960894} Fiber supplement: {Yes/No:304960894}  URINATION Pain with urination: {yes/no:20286} Fully empty bladder: {Yes/No:304960894} Stream: {PT urination:27102} Urgency: {Yes/No:304960894} Frequency: *** Leakage: {PT leakage:27103} Pads: {Yes/No:304960894}  INTERCOURSE Pain with intercourse: {pain with intercourse PA:27099} Ability to have vaginal penetration:  {Yes/No:304960894} Climax: *** Marinoff Scale: ***/3  PREGNANCY Vaginal deliveries *** Tearing {Yes***/No:304960894} C-section deliveries *** Currently pregnant {Yes***/No:304960894}  PROLAPSE {PT prolapse:27101}  OBJECTIVE:   DIAGNOSTIC FINDINGS:  ***  PATIENT SURVEYS:  {rehab surveys:24030}  PFIQ-7 ***  COGNITION:  Overall cognitive status: {cognition:24006}     SENSATION:  Light touch: {intact/deficits:24005}  Proprioception: {intact/deficits:24005}  MUSCLE LENGTH: Hamstrings: Right *** deg; Left *** deg Thomas test: Right *** deg; Left *** deg  LUMBAR SPECIAL TESTS:  {lumbar special test:25242}  FUNCTIONAL TESTS:  {Functional tests:24029}  GAIT: Distance walked: *** Assistive device utilized: {Assistive devices:23999} Level of assistance: {Levels of assistance:24026} Comments: ***  POSTURE:  ***  LUMBARAROM/PROM  A/PROM A/PROM  eval  Flexion   Extension   Right lateral flexion   Left lateral flexion   Right rotation   Left rotation    (Blank rows = not tested)  LOWER EXTREMITY ROM:  {AROM/PROM:27142} ROM Right eval Left eval  Hip flexion    Hip extension    Hip abduction    Hip adduction    Hip internal rotation    Hip external rotation    Knee flexion    Knee extension    Ankle dorsiflexion    Ankle plantarflexion    Ankle inversion    Ankle eversion     (Blank rows = not tested)  LOWER EXTREMITY MMT:  MMT Right eval Left eval  Hip flexion    Hip extension    Hip abduction     Hip adduction    Hip internal rotation    Hip external rotation    Knee flexion    Knee extension    Ankle dorsiflexion    Ankle plantarflexion    Ankle inversion    Ankle eversion     PELVIC MMT:   MMT eval  Vaginal   Internal Anal Sphincter   External Anal Sphincter   Puborectalis   Diastasis Recti   (Blank rows = not tested)        PALPATION:   General  ***                External Perineal Exam ***                             Internal Pelvic Floor ***  TONE: ***  PROLAPSE: ***  TODAY'S TREATMENT  EVAL ***   PATIENT EDUCATION:  Education details: *** Person educated: {Person educated:25204} Education method: {Education Method:25205} Education comprehension: {Education Comprehension:25206}   HOME EXERCISE PROGRAM: ***  ASSESSMENT:  CLINICAL IMPRESSION: Patient is a *** y.o. *** who was seen today for physical therapy evaluation and treatment for ***.    OBJECTIVE IMPAIRMENTS {opptimpairments:25111}.   ACTIVITY LIMITATIONS {activitylimitations:27494}  PARTICIPATION LIMITATIONS: {participationrestrictions:25113}  PERSONAL FACTORS {Personal factors:25162} are also affecting patient's functional outcome.   REHAB POTENTIAL: {rehabpotential:25112}  CLINICAL DECISION MAKING: {clinical decision making:25114}  EVALUATION COMPLEXITY: {Evaluation complexity:25115}   GOALS: Goals reviewed with patient? {yes/no:20286}  SHORT TERM GOALS: Target date: {follow up:25551}  *** Baseline: Goal status: {GOALSTATUS:25110}  2.  *** Baseline:  Goal status: {GOALSTATUS:25110}  3.  *** Baseline:  Goal status: {GOALSTATUS:25110}  4.  *** Baseline:  Goal status: {GOALSTATUS:25110}  5.  *** Baseline:  Goal status: {GOALSTATUS:25110}  6.  *** Baseline:  Goal status: {GOALSTATUS:25110}  LONG TERM GOALS: Target date: {follow up:25551}   *** Baseline:  Goal status: {GOALSTATUS:25110}  2.  *** Baseline:  Goal status: {GOALSTATUS:25110}  3.   *** Baseline:  Goal status: {GOALSTATUS:25110}  4.  *** Baseline:  Goal status: {GOALSTATUS:25110}  5.  *** Baseline:  Goal  status: {GOALSTATUS:25110}  6.  *** Baseline:  Goal status: {GOALSTATUS:25110}  PLAN: PT FREQUENCY: {rehab frequency:25116}  PT DURATION: {rehab duration:25117}  PLANNED INTERVENTIONS: {rehab planned interventions:25118::"Therapeutic exercises","Therapeutic activity","Neuromuscular re-education","Balance training","Gait training","Patient/Family education","Joint mobilization"}  PLAN FOR NEXT SESSION: ***   Jonesha Tsuchiya, PT 04/29/2022, 8:00 AM

## 2022-04-30 NOTE — Therapy (Deleted)
OUTPATIENT PHYSICAL THERAPY FEMALE PELVIC EVALUATION   Patient Name: Karen Warner MRN: [id number] DOB:1951-05-30, 71 y.o., female Today's Date: 04/30/2022    Past Medical History:  Diagnosis Date   Anemia 12/12/2019   Arthralgia 01/14/2020   Arthritis    Asthma    Atherosclerotic heart disease of native coronary artery without angina pectoris 04/13/2014   CHF (congestive heart failure) (HCC)    Chronic bilateral low back pain with bilateral sciatica 01/13/2019   Chronic bladder pain 11/08/2014   Last Assessment & Plan:  Formatting of this note might be different from the original. Patient has chronic pain syndrome in general I think this is unfortunately worsening her pelvic pain and bladder pain her examination today was very reassuring I am advised her to use the estrogen cream I did start her on Elavil 10 milligrams at that time if she does tolerated will increase it to 25 milligrams.    Chronic headaches    Chronic idiopathic constipation 12/22/2014   Formatting of this note might be different from the original. Last Assessment & Plan:  For better bowel emptying please use either Citrucel / Benefiber start with 2 tablespoons daily and titrate up or down to effect.  Also please use glycerin suppositories as needed to assist in evacuation. Last Assessment & Plan:  Formatting of this note might be different from the original. For better bowel empt   Chronic obstructive pulmonary disease, unspecified (HCC) 03/18/2018   Class 1 obesity due to excess calories with serious comorbidity and body mass index (BMI) of 31.0 to 31.9 in adult 06/26/2020   Colon polyps    COPD (chronic obstructive pulmonary disease) (HCC)    Coronary artery disease    DDD (degenerative disc disease), cervical 01/13/2019   Depression 11/05/2019   Diabetic polyneuropathy associated with type 2 diabetes mellitus (HCC) 02/24/2019   Diverticulitis 05/20/2018   Diverticulitis of colon 03/18/2018   Family history of  ischemic heart disease (IHD) 08/16/2013   Fibromyalgia affecting multiple sites 01/14/2020   Gastro-esophageal reflux disease without esophagitis 01/13/2019   Glaucoma 11/08/2014   Hordeolum externum of left upper eyelid 03/13/2020   Hypertension 11/08/2014   IBS (irritable bowel syndrome)    Iron deficiency anemia 01/13/2019   Left renal mass 11/08/2019   Leukocytosis 05/28/2018   Low vitamin B12 level 03/13/2020   Low vitamin D level 12/12/2019   LV dysfunction 05/31/2019   Malignant essential hypertension 01/22/2016   Migraine, unspecified, not intractable, without status migrainosus 11/08/2014   Mixed hyperlipidemia 01/13/2019   Myocardial infarction (HCC)    Other premature beats 11/08/2014   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 Last Assessment & Plan:  For vaginal atrophy please place a pea size dab of Estrogen vaginal cream  into the vagina 3 times a week ( Monday, Wednesday, Friday) Last Assessment & Plan:  Formatting of this note might be different from the original. For vaginal atrophy please place a pea size dab of Estrogen vaginal cream  into the vagina    Past Surgical History:  Procedure Laterality Date   ABDOMINAL HYSTERECTOMY     BIV ICD INSERTION CRT-D N/A 12/31/2020   Procedure: BIV ICD INSERTION CRT-D;  Surgeon: Evans Lance, MD;  Location: [place] [facility];  Service: Cardiovascular;  Laterality: N/A;   BLEPHAROPLASTY Bilateral    COLON SURGERY     CORONARY ANGIOPLASTY WITH STENT PLACEMENT  2020   X3   CORONARY STENT INTERVENTION N/A 08/03/2020   Procedure: CORONARY STENT INTERVENTION;  Surgeon: Sherren Mocha, MD;  Location: [place] [facility];   Service: Cardiovascular;  Laterality: N/A;   INTRAVASCULAR PRESSURE WIRE/FFR STUDY N/A 08/03/2020   Procedure: INTRAVASCULAR PRESSURE WIRE/FFR STUDY;  Surgeon: Sherren Mocha, MD;  Location: [place] [facility];  Service: Cardiovascular;  Laterality: N/A;   LEFT HEART CATH AND CORONARY ANGIOGRAPHY N/A 08/03/2020   Procedure: LEFT HEART CATH AND CORONARY ANGIOGRAPHY;  Surgeon: Sherren Mocha, MD;  Location: [place] [facility];  Service: Cardiovascular;  Laterality: N/A;   REFRACTIVE SURGERY Left    [place]   Patient Active Problem List   Diagnosis Date Noted   Biventricular ICD (implantable cardioverter-defibrillator) in place 04/03/2021   Obstructive sleep apnea 03/15/2021   COPD (chronic obstructive pulmonary disease) (HCC)    Colon polyps    Chronic headaches    CHF (congestive heart failure) (HCC)    Asthma    Arthritis    Anemia in chronic kidney disease 02/19/2021   IBS (irritable bowel syndrome)    Syncope and collapse 12/26/2020   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) 08/03/2020   Ischemic cardiomyopathy 08/03/2020   Non-ST elevation (NSTEMI) myocardial infarction ([place]) 08/03/2020   Obesity (BMI 30-39.9) 06/26/2020   Pustular rash 05/14/2020   Torticollis, acute 04/11/2020   Hordeolum externum of left upper eyelid 03/13/2020   Low vitamin B12 level 03/13/2020   Pain of right great toe 01/24/2020   Arthralgia 01/14/2020   Fibromyalgia affecting multiple sites 01/14/2020   Diarrhea of presumed infectious origin 01/07/2020   Ingrown nail 12/27/2019    History of anemia 12/12/2019   Low hemoglobin 12/12/2019   Low vitamin D level 12/12/2019   Anemia 12/12/2019   Long-term use of aspirin therapy 11/15/2019   Left renal mass 11/08/2019   Depression 11/05/2019   Neuropathy 11/05/2019   Other fatigue 09/12/2019   Rhinosinusitis 09/12/2019   Hypertensive emergency 08/06/2019   Left ventricular dysfunction 05/31/2019   LV dysfunction 05/31/2019   Trochanteric bursitis of right hip 04/20/2019   Aftercare following surgery 04/12/2019   Injury of toenail of left foot 04/06/2019   Moderate episode of recurrent major depressive disorder ([place]) 03/29/2019   Diabetic polyneuropathy associated with type 2 diabetes mellitus ([place]) 02/24/2019   History of disseminated herpes simplex 02/22/2019   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 ([place]) [id number]   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 ([place]) 02/22/2019   Seasonal allergies 02/22/2019   Encounter to establish care 01/23/2019   Vitamin D deficiency 01/23/2019   Chronic bilateral low back pain with bilateral sciatica 01/13/2019   DDD (degenerative disc disease), cervical 01/13/2019   Gastro-esophageal reflux disease without esophagitis 01/13/2019   History of intestinal surgery 01/13/2019   Iron deficiency anemia 01/13/2019   Mixed hyperlipidemia 01/13/2019   Tobacco abuse 01/13/2019   Leukocytosis  05/28/2018   Diverticulitis 05/20/2018   Chronic obstructive pulmonary disease, unspecified ([place]) 03/18/2018   Diverticulitis of colon 03/18/2018   PVD (peripheral vascular disease) ([place]) 04/08/2017   Chest pain 04/07/2017   Malignant essential hypertension 01/22/2016   Lower abdominal pain 12/22/2014   Chronic idiopathic constipation 12/22/2014   Chronic bladder pain 11/08/2014   Glaucoma 11/08/2014   H/O vaginal hysterectomy 11/08/2014   Hypertension 11/08/2014   Migraine, unspecified, not intractable, without status migrainosus 11/08/2014   Nocturia 11/08/2014   Other premature beats  11/08/2014   Urge incontinence 11/08/2014   Urinary urgency 11/08/2014   Vaginal atrophy 11/08/2014   Atherosclerotic heart disease of native coronary artery without angina pectoris 04/13/2014   Type 2 diabetes mellitus with stage 3b chronic kidney disease, with long-term current use of insulin ([place]) 12/08/2013   Family history of ischemic heart disease (IHD) 08/16/2013   Ptosis 04/19/2012    PCP: Nodal, JR. Gerre Scull, PA-C  REFERRING PROVIDER: Dan Europe, NP  REFERRING DIAG: R15.9 (ICD-10-CM) - Incontinence of feces, unspecified fecal incontinence type  THERAPY DIAG:  No diagnosis found.  Rationale for Evaluation and Treatment Rehabilitation  ONSET DATE: ***  SUBJECTIVE:                                                                                                                                                                                           SUBJECTIVE STATEMENT: *** Fluid intake: {Yes/No:304960894}   Patient confirms identification and approves PT to assess pelvic floor and treatment {yes/no:20286}   PAIN:  Are you having pain? {yes/no:20286} NPRS scale: ***/10 Pain location: {pelvic pain location:27098}  Pain type: {type:313116} Pain description: {PAIN DESCRIPTION:21022940}   Aggravating factors: *** Relieving factors: ***  PRECAUTIONS: {Therapy precautions:24002}  WEIGHT BEARING RESTRICTIONS {Yes ***/No:24003}  FALLS:  Has patient fallen in last 6 months? {fallsyesno:27318}  LIVING ENVIRONMENT: Lives with: {OPRC lives with:25569::"lives with their family"} Lives in: {Lives in:25570} Stairs: {opstairs:27293} Has following equipment at home: {Assistive devices:23999}  OCCUPATION: ***  PLOF: {PLOF:24004}  PATIENT GOALS ***  PERTINENT HISTORY:  CHF, Chronic obstructive pulmonary disease; Fibromyalgia, IBS, Type Diabetes, Abdominal hysterectomy Sexual abuse: {Yes/No:304960894}  BOWEL MOVEMENT Pain with bowel movement:  {yes/no:20286} Type of bowel movement:{PT BM type:27100} Fully empty rectum: {Yes/No:304960894} Leakage: {Yes/No:304960894} Pads: {Yes/No:304960894} Fiber supplement: {Yes/No:304960894}  URINATION Pain with urination: {yes/no:20286} Fully empty bladder: {Yes/No:304960894} Stream: {PT urination:27102} Urgency: {Yes/No:304960894} Frequency: *** Leakage: {PT leakage:27103} Pads: {Yes/No:304960894}  INTERCOURSE Pain with intercourse: {pain with intercourse PA:27099} Ability to have vaginal penetration:  {Yes/No:304960894} Climax: *** Marinoff Scale: ***/3  PREGNANCY Vaginal deliveries *** Tearing {Yes***/No:304960894} C-section deliveries *** Currently pregnant {Yes***/No:304960894}  PROLAPSE {PT  prolapse:27101}    OBJECTIVE:   DIAGNOSTIC FINDINGS:  ***  PATIENT SURVEYS:  {rehab surveys:24030}  PFIQ-7 ***  COGNITION:  Overall cognitive status: {cognition:24006}     SENSATION:  Light touch: {intact/deficits:24005}  Proprioception: {intact/deficits:24005}  MUSCLE LENGTH: Hamstrings: Right *** deg; Left *** deg Thomas test: Right *** deg; Left *** deg  LUMBAR SPECIAL TESTS:  {lumbar special test:25242}  FUNCTIONAL TESTS:  {Functional tests:24029}  GAIT: Distance walked: *** Assistive device utilized: {Assistive devices:23999} Level of assistance: {Levels of assistance:24026} Comments: ***  POSTURE:  ***  LUMBARAROM/PROM  A/PROM A/PROM  eval  Flexion   Extension   Right lateral flexion   Left lateral flexion   Right rotation   Left rotation    (Blank rows = not tested)  LOWER EXTREMITY ROM:  {AROM/PROM:27142} ROM Right eval Left eval  Hip flexion    Hip extension    Hip abduction    Hip adduction    Hip internal rotation    Hip external rotation    Knee flexion    Knee extension    Ankle dorsiflexion    Ankle plantarflexion    Ankle inversion    Ankle eversion     (Blank rows = not tested)  LOWER EXTREMITY MMT:  MMT  Right eval Left eval  Hip flexion    Hip extension    Hip abduction    Hip adduction    Hip internal rotation    Hip external rotation    Knee flexion    Knee extension    Ankle dorsiflexion    Ankle plantarflexion    Ankle inversion    Ankle eversion     PELVIC MMT:   MMT eval  Vaginal   Internal Anal Sphincter   External Anal Sphincter   Puborectalis   Diastasis Recti   (Blank rows = not tested)        PALPATION:   General  ***                External Perineal Exam ***                             Internal Pelvic Floor ***  TONE: ***  PROLAPSE: ***  TODAY'S TREATMENT  EVAL ***   PATIENT EDUCATION:  Education details: *** Person educated: {Person educated:25204} Education method: {Education Method:25205} Education comprehension: {Education Comprehension:25206}   HOME EXERCISE PROGRAM: ***  ASSESSMENT:  CLINICAL IMPRESSION: Patient is a *** y.o. *** who was seen today for physical therapy evaluation and treatment for ***.    OBJECTIVE IMPAIRMENTS {opptimpairments:25111}.   ACTIVITY LIMITATIONS {activitylimitations:27494}  PARTICIPATION LIMITATIONS: {participationrestrictions:25113}  PERSONAL FACTORS {Personal factors:25162} are also affecting patient's functional outcome.   REHAB POTENTIAL: {rehabpotential:25112}  CLINICAL DECISION MAKING: {clinical decision making:25114}  EVALUATION COMPLEXITY: {Evaluation complexity:25115}   GOALS: Goals reviewed with patient? {yes/no:20286}  SHORT TERM GOALS: Target date: {follow up:25551}  *** Baseline: Goal status: {GOALSTATUS:25110}  2.  *** Baseline:  Goal status: {GOALSTATUS:25110}  3.  *** Baseline:  Goal status: {GOALSTATUS:25110}  4.  *** Baseline:  Goal status: {GOALSTATUS:25110}  5.  *** Baseline:  Goal status: {GOALSTATUS:25110}  6.  *** Baseline:  Goal status: {GOALSTATUS:25110}  LONG TERM GOALS: Target date: {follow up:25551}   *** Baseline:  Goal status:  {GOALSTATUS:25110}  2.  *** Baseline:  Goal status: {GOALSTATUS:25110}  3.  *** Baseline:  Goal status: {GOALSTATUS:25110}  4.  *** Baseline:  Goal status: {GOALSTATUS:25110}  5.  ***  Baseline:  Goal status: {GOALSTATUS:25110}  6.  *** Baseline:  Goal status: {GOALSTATUS:25110}  PLAN: PT FREQUENCY: {rehab frequency:25116}  PT DURATION: {rehab duration:25117}  PLANNED INTERVENTIONS: {rehab planned interventions:25118::"Therapeutic exercises","Therapeutic activity","Neuromuscular re-education","Balance training","Gait training","Patient/Family education","Joint mobilization"}  PLAN FOR NEXT SESSION: ***   Madysyn Hanken, PT 04/30/2022, 9:16 AM

## 2022-05-01 ENCOUNTER — Encounter: Payer: Medicare Other | Admitting: Physical Therapy

## 2022-05-02 ENCOUNTER — Ambulatory Visit: Payer: Medicare Other | Admitting: Nurse Practitioner

## 2022-05-02 ENCOUNTER — Telehealth: Payer: Self-pay | Admitting: Nurse Practitioner

## 2022-05-02 NOTE — Telephone Encounter (Signed)
Spoke with pt who stated she was discharged about 1 week ago for a bowel obstruction. Pt has followed up with her PCP since being discharged and completed xrays. Pt stated that PCP first told her to take laxatives to help remove excess stool from bowel and then also prescribed Linzess. Pt stated she went to her pharmacy to pick up Clarence but was told medication was not sent in and pt reported she did not follow up with PCP office to see why medication was not sent in. Pt stated that her last bowel movement was today 6/2 and she has been having bowel movements everyday since her discharge, sometimes 2x/day. Pt states she currently has nausea without vomiting and lower abd pain. Advised pt to call PCP to see if Linzess was maybe sent to a different pharmacy or why she was unable to pick up medication. Also let pt know that if symptoms get worse she can go to an urgent care or ER. Pt scheduled for soonest available appt on 05/15/22 at 1:30 pm with Vicie Mutters. Pt verbalized understanding and had no other concerns at end of call.

## 2022-05-02 NOTE — Telephone Encounter (Signed)
Called patient to reschedule her appt today due to APP being out sick. The patient states she was seen  at the ED she is having a lot of pain and possibly having a bowel obstruction seeking advise.

## 2022-05-07 NOTE — Therapy (Signed)
OUTPATIENT PHYSICAL THERAPY FEMALE PELVIC EVALUATION   Patient Name: Karen Warner MRN: [id number] DOB:[birth year], 71 y.o., female Today's Date: 05/08/2022   PT End of Session - 05/08/22 1706     Visit Number 1    Date for PT Re-Evaluation 07/31/22    Authorization Type Medicare    PT Start Time 1500    PT Stop Time 1555    PT Time Calculation (min) 55 min    Activity Tolerance Patient tolerated treatment well    Behavior During Therapy [facility] for tasks assessed/performed             Past Medical History:  Diagnosis Date   Anemia 12/12/2019   Arthralgia 01/14/2020   Arthritis    Asthma    Atherosclerotic heart disease of native coronary artery without angina pectoris 04/13/2014   CHF (congestive heart failure) (HCC)    Chronic bilateral low back pain with bilateral sciatica 01/13/2019   Chronic bladder pain 11/08/2014   Last Assessment & Plan:  Formatting of this note might be different from the original. Patient has chronic pain syndrome in general I think this is unfortunately worsening her pelvic pain and bladder pain her examination today was very reassuring I am advised her to use the estrogen cream I did start her on Elavil 10 milligrams at that time if she does tolerated will increase it to 25 milligrams.    Chronic headaches    Chronic idiopathic constipation 12/22/2014   Formatting of this note might be different from the original. Last Assessment & Plan:  For better bowel emptying please use either Citrucel / Benefiber start with 2 tablespoons daily and titrate up or down to effect.  Also please use glycerin suppositories as needed to assist in evacuation. Last Assessment & Plan:  Formatting of this note might be different from the original. For better bowel empt   Chronic obstructive pulmonary disease, unspecified ([place]) 03/18/2018   Class 1 obesity due to excess calories with serious comorbidity and body mass index (BMI) of 31.0 to 31.9 in adult 06/26/2020   Colon  polyps    COPD (chronic obstructive pulmonary disease) (HCC)    Coronary artery disease    DDD (degenerative disc disease), cervical 01/13/2019   Depression 11/05/2019   Diabetic polyneuropathy associated with type 2 diabetes mellitus ([place]) 02/24/2019   Diverticulitis 05/20/2018   Diverticulitis of colon 03/18/2018   Family history of ischemic heart disease (IHD) 08/16/2013   Fibromyalgia affecting multiple sites 01/14/2020   Gastro-esophageal reflux disease without esophagitis 01/13/2019   Glaucoma 11/08/2014   Hordeolum externum of left upper eyelid 03/13/2020   Hypertension 11/08/2014   IBS (irritable bowel syndrome)    Iron deficiency anemia 01/13/2019   Left renal mass 11/08/2019   Leukocytosis 05/28/2018   Low vitamin B12 level 03/13/2020   Low vitamin D level 12/12/2019   LV dysfunction 05/31/2019   Malignant essential hypertension 01/22/2016   Migraine, unspecified, not intractable, without status migrainosus 11/08/2014   Mixed hyperlipidemia 01/13/2019   Myocardial infarction ([place])    Other premature beats 11/08/2014   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 Last Assessment & Plan:  For vaginal atrophy please place a pea  size dab of Estrogen vaginal cream  into the vagina 3 times a week ( Monday, Wednesday, Friday) Last Assessment & Plan:  Formatting of this note might be different from the original. For vaginal atrophy please place a pea size dab of Estrogen vaginal cream  into the vagina    Past Surgical History:  Procedure Laterality Date   ABDOMINAL HYSTERECTOMY     BIV ICD INSERTION CRT-D N/A 12/31/2020   Procedure: BIV ICD INSERTION CRT-D;  Surgeon: Evans Lance, MD;  Location: [facility];  Service: Cardiovascular;  Laterality: N/A;   BLEPHAROPLASTY Bilateral    COLON SURGERY     CORONARY ANGIOPLASTY WITH STENT PLACEMENT  2020   X3   CORONARY STENT INTERVENTION N/A 08/03/2020   Procedure: CORONARY STENT INTERVENTION;  Surgeon: Sherren Mocha, MD;  Location: [facility];  Service: Cardiovascular;  Laterality: N/A;   INTRAVASCULAR PRESSURE WIRE/FFR STUDY N/A 08/03/2020   Procedure: INTRAVASCULAR PRESSURE WIRE/FFR STUDY;  Surgeon: Sherren Mocha, MD;  Location: [place];  Service: Cardiovascular;  Laterality: N/A;   LEFT HEART CATH AND CORONARY ANGIOGRAPHY N/A 08/03/2020   Procedure: LEFT HEART CATH AND CORONARY ANGIOGRAPHY;  Surgeon: Sherren Mocha, MD;  Location: [place] [facility];  Service: Cardiovascular;  Laterality: N/A;   REFRACTIVE SURGERY Left    [place]   Patient Active Problem List   Diagnosis Date Noted   Biventricular ICD (implantable cardioverter-defibrillator) in place 04/03/2021   Obstructive sleep apnea 03/15/2021   COPD (chronic obstructive pulmonary disease) (HCC)    Colon polyps    Chronic headaches    CHF (congestive heart failure) (HCC)    Asthma    Arthritis    Anemia in chronic kidney disease 02/19/2021   IBS (irritable bowel syndrome)    Syncope and collapse 12/26/2020   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9) 06/26/2020   Pustular  rash 05/14/2020   Torticollis, acute 04/11/2020   Hordeolum externum of left upper eyelid 03/13/2020   Low vitamin B12 level 03/13/2020   Pain of right great toe 01/24/2020   Arthralgia 01/14/2020   Fibromyalgia affecting multiple sites 01/14/2020   Diarrhea of presumed infectious origin 01/07/2020   Ingrown nail 12/27/2019   History of anemia 12/12/2019   Low hemoglobin 12/12/2019   Low vitamin D level 12/12/2019   Anemia 12/12/2019   Long-term use of aspirin therapy 11/15/2019   Left renal mass 11/08/2019   Depression 11/05/2019   Neuropathy 11/05/2019   Other fatigue 09/12/2019   Rhinosinusitis 09/12/2019   Hypertensive emergency 08/06/2019   Left ventricular dysfunction 05/31/2019   LV dysfunction 05/31/2019   Trochanteric bursitis of right hip 04/20/2019   Aftercare following surgery 04/12/2019   Injury of toenail of left foot 04/06/2019   Moderate episode of recurrent major depressive disorder ([place]) 03/29/2019   Diabetic polyneuropathy associated with type 2 diabetes mellitus ([place]) 02/24/2019   History of disseminated herpes simplex 02/22/2019   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 (  [place]) 02/22/2019   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 ([place]) 02/22/2019   Seasonal allergies 02/22/2019   Encounter to establish care 01/23/2019   Vitamin D deficiency 01/23/2019   Chronic bilateral low back pain with bilateral sciatica 01/13/2019   DDD (degenerative disc disease), cervical 01/13/2019   Gastro-esophageal reflux disease without esophagitis 01/13/2019   History of intestinal surgery 01/13/2019   Iron deficiency anemia 01/13/2019   Mixed hyperlipidemia 01/13/2019   Tobacco abuse 01/13/2019   Leukocytosis 05/28/2018   Diverticulitis 05/20/2018   Chronic obstructive pulmonary disease, unspecified ([place]) 03/18/2018   Diverticulitis of colon 03/18/2018   PVD (peripheral vascular disease) ([place]) 04/08/2017   Chest pain 04/07/2017   Malignant essential hypertension  01/22/2016   Lower abdominal pain 12/22/2014   Chronic idiopathic constipation 12/22/2014   Chronic bladder pain 11/08/2014   Glaucoma 11/08/2014   H/O vaginal hysterectomy 11/08/2014   Hypertension 11/08/2014   Migraine, unspecified, not intractable, without status migrainosus 11/08/2014   Nocturia 11/08/2014   Other premature beats 11/08/2014   Urge incontinence 11/08/2014   Urinary urgency 11/08/2014   Vaginal atrophy 11/08/2014   Atherosclerotic heart disease of native coronary artery without angina pectoris 04/13/2014   Type 2 diabetes mellitus with stage 3b chronic kidney disease, with long-term current use of insulin ([place]) 12/08/2013   Family history of ischemic heart disease (IHD) 08/16/2013   Ptosis 04/19/2012    PCP: Maggie Schwalbe, PA-C  REFERRING PROVIDER: Willia Craze, NP  REFERRING DIAG: R15.9 (ICD-10-CM) - Incontinence of feces, unspecified fecal incontinence type  THERAPY DIAG:  Muscle weakness (generalized)  Lower abdominal pain  Other lack of coordination  Rationale for Evaluation and Treatment Rehabilitation  ONSET DATE: 05/19/2028  SUBJECTIVE:                                                                                                                                                                                           SUBJECTIVE STATEMENT: Patient fecal leakge started after she had part of her intestines out on 05/19/2018. Patient was takin gthe MIralax and had trouble getting clean. Her small intestines are full again so she needs to clean out.  Fluid intake: Yes:      Patient confirms identification and approves PT to assess pelvic floor and treatment No   PAIN:  Are you having pain? Yes NPRS scale: 4-8/10 Pain location:  lower abdominal pain and upper is more bloating , around the surgical site  Pain type: like pelvis is opening up, pressure, achiness Pain description: constant   Aggravating factors: comes on  randomly Relieving factors: take medication  PRECAUTIONS: Pace maker  WEIGHT BEARING RESTRICTIONS  No  FALLS:  Has patient fallen in last 6 months? No  LIVING ENVIRONMENT: Lives with:  lives wit significant other OCCUPATION: retired  PLOF: Independent  PATIENT GOALS figure out what keeps stomach sore , not leak stool with gas  PERTINENT HISTORY:  CHF, Chronic obstructive pulmonary disease; Type 2 diabetes, Fibromyalgia, Glaucoma, Myocardial Infarction, PVD, Abdominal Hysterectomy, Pace maker   BOWEL MOVEMENT Pain with bowel movement: No Type of bowel movement:Type (Bristol Stool Scale) Type 2, 3, 4 and Frequency 2-3 times per day Fully empty rectum: No sometimes Leakage: Yes: when she passes gas, Pads: Yes: 2 pads per day Fiber supplement: Yes: Miralax  URINATION Pain with urination: No Fully empty bladder: Yes:   Stream: Strong Urgency: Yes:   Frequency: normal Leakage: Urge to void on occasion when she waits too long Pads: Yes: for fecal and urinary leakage     OBJECTIVE:     COGNITION:  Overall cognitive status: Within functional limits for tasks assessed    POSTURE:  Patient stands with big abdomen, reduced lumbar lordosis, forward head and rounded shoulders  LUMBARAROM/PROM  A/PROM A/PROM  eval  Flexion full  Extension Decreased by 50%  Right lateral flexion Decreased by 25%  Left lateral flexion Decreased by 25%  Right rotation Decreased by 50%  Left rotation Decreased by 50%   (Blank rows = not tested)  LOWER EXTREMITY ROM:  Passive ROM Right eval Left eval  Hip flexion 105 85  Hip external rotation 40 50   (Blank rows = not tested)  LOWER EXTREMITY MMT:  MMT Right eval Left eval  Hip extension 3+/5 3+/5  3+/52/5  4/5  4/5  4/5    Hip abduction 2/5 2/5  Hip adduction 4/5 4/5  Hip internal rotation 4/5 4/5  Hip external rotation 4/5 4/5   PELVIC MMT: will be done at next visit. Therapist explained to patient what the exam  of the pelvis entails to be prepared for the next .    MMT eval  Vaginal   Internal Anal Sphincter   External Anal Sphincter   Puborectalis   Diastasis Recti   (Blank rows = not tested)        PALPATION:   General  scar suprapubically is tight and sensitive, tender in the lower abdominal. Superior midline in abdomen, minimal abdominal contraction                External Perineal Exam will be done at next visit                             Internal Pelvic Floor will be done at next visit  T TODAY'S TREATMENT  EVAL internal exam will be completed at next visit    [place]: None today  ASSESSMENT:  CLINICAL IMPRESSION: Patient is a 71 y.o. female  who was seen today for physical therapy evaluation and treatment for fecal incontinence. Patient abdominal pain comes on randomly at level 4-8/10. Patient suprapubic scar is limited and painful. Patient has bloating of the upper abdominal. Patient has limited lumbar ROM and bilateral hip flexion and external rotation. Patient has weakness in bilateral hips. Patient will leak stool when she passes gas. Patient will leak urine when she has the urge to void. Patient wear 2 pads during the day. Patients has difficulty with contracting her abdomen. Patient will benefit from skilled therapy to reduce her abdominal pain and reduce fecal and urinary leakage.  OBJECTIVE IMPAIRMENTS decreased activity tolerance, decreased coordination, decreased endurance, decreased strength, increased fascial restrictions, increased muscle spasms, and pain.   ACTIVITY LIMITATIONS  pass gas will leak stool , continence, toileting  PARTICIPATION LIMITATIONS:  when she has the urge  PERSONAL FACTORS CHF, Chronic obstructive pulmonary disease; Type 2 diabetes, Fibromyalgia, Glaucoma, Myocardial Infarction, PVD, Abdominal Hysterectomy, Pace maker are also affecting patient's functional outcome.   REHAB POTENTIAL: Good  CLINICAL DECISION MAKING:  Stable/uncomplicated  EVALUATION COMPLEXITY: Low   GOALS: Goals reviewed with patient? Yes  SHORT TERM GOALS: Target date: 06/05/2022  Patient understands how to perform abdominal massage to reduce pain and bloating.  Baseline: Goal status: INITIAL  2.  Patient pelvic floor is assessed to determine her strength.  Baseline:  Goal status: INITIAL   LONG TERM GOALS: Target date: 07/31/2022   Patient independent with HEP to include hip stretches, abdominal contraction, pelvic floor strength.  Baseline:  Goal status: INITIAL  2.  Patient reports her abdominal pain is </= 2/10 due to improved scar mobility and reduction of trigger points in the abdomen.  Baseline:  Goal status: INITIAL  3.  Bilateral hip flexion >/= 105 so she is able to sit on the toilet to have a bowel movement and fully empty her rectum.  Baseline:  Goal status: INITIAL  4.  Patient pelvic floor strength >/= 3/5 so her fecal leakage with gas reduced >/= 70%.  Baseline:  Goal status: INITIAL  5.  Patient pelvic floor strength >/= 3/5 so her urinary leakage is reduced >/= 70%.  Baseline:  Goal status: INITIAL   PLAN: PT FREQUENCY: 1x/week  PT DURATION: 12 weeks  PLANNED INTERVENTIONS: Therapeutic exercises, Therapeutic activity, Neuromuscular re-education, Patient/Family education, Joint mobilization, Dry Needling, Spinal mobilization, Cryotherapy, Moist heat, scar mobilization, Taping, Biofeedback, and Manual therapy  PLAN FOR NEXT SESSION: abdominal massage and educate patient on how to perform with scar massage; evaluate the pelvic floor   Earlie Counts, PT 05/08/22 5:31 PM

## 2022-05-08 ENCOUNTER — Encounter: Payer: Self-pay | Admitting: Physical Therapy

## 2022-05-08 ENCOUNTER — Encounter: Payer: Medicare Other | Attending: Nurse Practitioner | Admitting: Physical Therapy

## 2022-05-08 DIAGNOSIS — M6281 Muscle weakness (generalized): Secondary | ICD-10-CM

## 2022-05-08 DIAGNOSIS — R278 Other lack of coordination: Secondary | ICD-10-CM

## 2022-05-08 DIAGNOSIS — R1030 Lower abdominal pain, unspecified: Secondary | ICD-10-CM | POA: Diagnosis present

## 2022-05-12 ENCOUNTER — Telehealth: Payer: Self-pay | Admitting: Cardiology

## 2022-05-12 NOTE — Telephone Encounter (Signed)
Patient call stating [employer] called her and told her they still haven't received all the necessary paperwork needed.  They are going to refax paperwork to [place] and send her forms again.  She would like to speak to the nurse [facility] about this.

## 2022-05-12 NOTE — Telephone Encounter (Signed)
Attempted to call pt. No answer, unable to leave message.  

## 2022-05-13 ENCOUNTER — Encounter: Payer: Self-pay | Admitting: Physical Therapy

## 2022-05-13 ENCOUNTER — Encounter: Payer: Medicare Other | Admitting: Physical Therapy

## 2022-05-13 DIAGNOSIS — M6281 Muscle weakness (generalized): Secondary | ICD-10-CM | POA: Diagnosis not present

## 2022-05-13 DIAGNOSIS — R278 Other lack of coordination: Secondary | ICD-10-CM

## 2022-05-13 DIAGNOSIS — R1030 Lower abdominal pain, unspecified: Secondary | ICD-10-CM

## 2022-05-13 NOTE — Telephone Encounter (Signed)
Patient is asking to speak with Dr. Servando Salina or nurse in regards to paperwork for [employer].

## 2022-05-13 NOTE — Patient Instructions (Signed)
About Abdominal Massage  Abdominal massage, also called external colon massage, is a self-treatment circular massage technique that can reduce and eliminate gas and ease constipation. The colon naturally contracts in waves in a clockwise direction starting from inside the right hip, moving up toward the ribs, across the belly, and down inside the left hip.  When you perform circular abdominal massage, you help stimulate your colon's normal wave pattern of movement called peristalsis.  It is most beneficial when done after eating.  Positioning You can practice abdominal massage with oil while lying down, or in the shower with soap.  Some people find that it is just as effective to do the massage through clothing while sitting or standing.  How to Massage Start by placing your finger tips or knuckles on your right side, just inside your hip bone.  Make small circular movements while you move upward toward your rib cage.   Once you reach the bottom right side of your rib cage, take your circular movements across to the left side of the bottom of your rib cage.  Next, move downward until you reach the inside of your left hip bone.  This is the path your feces travel in your colon. Continue to perform your abdominal massage in this pattern for 10 minutes each day.     You can apply as much pressure as is comfortable in your massage.  Start gently and build pressure as you continue to practice.  Notice any areas of pain as you massage; areas of slight pain may be relieved as you massage, but if you have areas of significant or intense pain, consult with your healthcare provider.  Other Considerations General physical activity including bending and stretching can have a beneficial massage-like effect on the colon.  Deep breathing can also stimulate the colon because breathing deeply activates the same nervous system that supplies the colon.   Abdominal massage should always be used in combination with a  bowel-conscious diet that is high in the proper type of fiber for you, fluids (primarily water), and a regular exercise program.  Alvira Hecht, PT [facility] [address], [address] [place], [place] [address] W: [phone] [email or web address]  

## 2022-05-13 NOTE — Telephone Encounter (Signed)
Patient called wanting to speak to [facility].

## 2022-05-13 NOTE — Telephone Encounter (Signed)
Called pt to let her know I did receive a fax from [employer] yesterday. It's an attestation for her to sign. Will place it at the front desk for her to sign and return to be faxed.

## 2022-05-13 NOTE — Therapy (Signed)
OUTPATIENT PHYSICAL THERAPY TREATMENT NOTE   Patient Name: Karen Warner MRN: [id number] DOB:[birth year], 71 y.o., female Today's Date: 05/13/2022  PCP: Maggie Schwalbe, PA-C REFERRING PROVIDER: Willia Craze, NP  END OF SESSION:   PT End of Session - 05/13/22 1456     Visit Number 2    Date for PT Re-Evaluation 07/31/22    Authorization Type Medicare    Authorization - Visit Number 2    Authorization - Number of Visits 10    PT Start Time [id number]    PT Stop Time 1545    PT Time Calculation (min) 51 min    Activity Tolerance Patient tolerated treatment well    Behavior During Therapy [facility] for tasks assessed/performed             Past Medical History:  Diagnosis Date   Anemia 12/12/2019   Arthralgia 01/14/2020   Arthritis    Asthma    Atherosclerotic heart disease of native coronary artery without angina pectoris 04/13/2014   CHF (congestive heart failure) (HCC)    Chronic bilateral low back pain with bilateral sciatica 01/13/2019   Chronic bladder pain 11/08/2014   Last Assessment & Plan:  Formatting of this note might be different from the original. Patient has chronic pain syndrome in general I think this is unfortunately worsening her pelvic pain and bladder pain her examination today was very reassuring I am advised her to use the estrogen cream I did start her on Elavil 10 milligrams at that time if she does tolerated will increase it to 25 milligrams.    Chronic headaches    Chronic idiopathic constipation 12/22/2014   Formatting of this note might be different from the original. Last Assessment & Plan:  For better bowel emptying please use either Citrucel / Benefiber start with 2 tablespoons daily and titrate up or down to effect.  Also please use glycerin suppositories as needed to assist in evacuation. Last Assessment & Plan:  Formatting of this note might be different from the original. For better bowel empt   Chronic obstructive pulmonary disease,  unspecified ([place]) 03/18/2018   Class 1 obesity due to excess calories with serious comorbidity and body mass index (BMI) of 31.0 to 31.9 in adult 06/26/2020   Colon polyps    COPD (chronic obstructive pulmonary disease) (HCC)    Coronary artery disease    DDD (degenerative disc disease), cervical 01/13/2019   Depression 11/05/2019   Diabetic polyneuropathy associated with type 2 diabetes mellitus ([place]) 02/24/2019   Diverticulitis 05/20/2018   Diverticulitis of colon 03/18/2018   Family history of ischemic heart disease (IHD) 08/16/2013   Fibromyalgia affecting multiple sites 01/14/2020   Gastro-esophageal reflux disease without esophagitis 01/13/2019   Glaucoma 11/08/2014   Hordeolum externum of left upper eyelid 03/13/2020   Hypertension 11/08/2014   IBS (irritable bowel syndrome)    Iron deficiency anemia 01/13/2019   Left renal mass 11/08/2019   Leukocytosis 05/28/2018   Low vitamin B12 level 03/13/2020   Low vitamin D level 12/12/2019   LV dysfunction 05/31/2019   Malignant essential hypertension 01/22/2016   Migraine, unspecified, not intractable, without status migrainosus 11/08/2014   Mixed hyperlipidemia 01/13/2019   Myocardial infarction ([place])    Other premature beats 11/08/2014   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 Last Assessment & Plan:  For vaginal atrophy please place a pea size dab of Estrogen vaginal cream  into the vagina 3 times a week ( Monday, Wednesday, Friday) Last Assessment & Plan:  Formatting of this note might be different from the original. For vaginal atrophy please place a pea size dab of Estrogen vaginal cream  into the vagina    Past Surgical  History:  Procedure Laterality Date   ABDOMINAL HYSTERECTOMY     BIV ICD INSERTION CRT-D N/A 12/31/2020   Procedure: BIV ICD INSERTION CRT-D;  Surgeon: Evans Lance, MD;  Location: [place] [facility];  Service: Cardiovascular;  Laterality: N/A;   BLEPHAROPLASTY Bilateral    COLON SURGERY     CORONARY ANGIOPLASTY WITH STENT PLACEMENT  2020   X3   CORONARY STENT INTERVENTION N/A 08/03/2020   Procedure: CORONARY STENT INTERVENTION;  Surgeon: Sherren Mocha, MD;  Location: [place] [facility];  Service: Cardiovascular;  Laterality: N/A;   INTRAVASCULAR PRESSURE WIRE/FFR STUDY N/A 08/03/2020   Procedure: INTRAVASCULAR PRESSURE WIRE/FFR STUDY;  Surgeon: Sherren Mocha, MD;  Location: [place] [facility];  Service: Cardiovascular;  Laterality: N/A;   LEFT HEART CATH AND CORONARY ANGIOGRAPHY N/A 08/03/2020   Procedure: LEFT HEART CATH AND CORONARY ANGIOGRAPHY;  Surgeon: Sherren Mocha, MD;  Location: [place] [facility];  Service: Cardiovascular;  Laterality: N/A;   REFRACTIVE SURGERY Left    [place]   Patient Active Problem List   Diagnosis Date Noted   Biventricular ICD (implantable cardioverter-defibrillator) in place 04/03/2021   Obstructive sleep apnea 03/15/2021   COPD (chronic obstructive pulmonary disease) (HCC)    Colon polyps    Chronic headaches    CHF (congestive heart failure) (HCC)    Asthma    Arthritis    Anemia in chronic kidney disease 02/19/2021   IBS (irritable bowel syndrome)    Syncope and collapse 12/26/2020   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   Orthostatic hypotension 11/21/2020   Daytime sleepiness 11/20/2020   Hypertensive heart disease with heart failure ([place]) 11/17/2020   Syncope 11/17/2020   Herpes simplex vulvovaginitis 11/02/2020   Snoring 10/17/2020   Depressed left ventricular ejection fraction 10/16/2020   Left bundle branch block 10/16/2020   Nasal congestion 09/30/2020   Coronary artery disease    Myocardial infarction [facility])    Unstable angina ([place])  08/03/2020   Ischemic cardiomyopathy 08/03/2020   Non-ST elevation (NSTEMI) myocardial infarction ([place]) 08/03/2020   Obesity (BMI 30-39.9) 06/26/2020   Pustular rash 05/14/2020   Torticollis, acute 04/11/2020   Hordeolum externum of left upper eyelid 03/13/2020   Low vitamin B12 level 03/13/2020   Pain of right great toe 01/24/2020   Arthralgia 01/14/2020   Fibromyalgia affecting multiple sites 01/14/2020   Diarrhea of presumed infectious origin 01/07/2020   Ingrown nail 12/27/2019   History of anemia 12/12/2019   Low hemoglobin 12/12/2019   Low vitamin D level 12/12/2019   Anemia 12/12/2019   Long-term use of aspirin therapy 11/15/2019   Left renal mass 11/08/2019   Depression 11/05/2019   Neuropathy 11/05/2019   Other fatigue 09/12/2019   Rhinosinusitis 09/12/2019   Hypertensive emergency 08/06/2019   Left ventricular dysfunction 05/31/2019   LV dysfunction 05/31/2019   Trochanteric bursitis of right hip 04/20/2019   Aftercare following surgery 04/12/2019   Injury of toenail of left foot 04/06/2019   Moderate episode of recurrent major  depressive disorder ([place]) 03/29/2019   Diabetic polyneuropathy associated with type 2 diabetes mellitus ([place]) 02/24/2019   History of disseminated herpes simplex 02/22/2019   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 ([place]) [id number]   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 ([place]) 02/22/2019   Seasonal allergies 02/22/2019   Encounter to establish care 01/23/2019   Vitamin D deficiency 01/23/2019   Chronic bilateral low back pain with bilateral sciatica 01/13/2019   DDD (degenerative disc disease), cervical 01/13/2019   Gastro-esophageal reflux disease without esophagitis 01/13/2019   History of intestinal surgery 01/13/2019   Iron deficiency anemia 01/13/2019   Mixed hyperlipidemia 01/13/2019   Tobacco abuse 01/13/2019   Leukocytosis 05/28/2018   Diverticulitis 05/20/2018   Chronic obstructive pulmonary disease, unspecified ([place])  03/18/2018   Diverticulitis of colon 03/18/2018   PVD (peripheral vascular disease) ([place]) 04/08/2017   Chest pain 04/07/2017   Malignant essential hypertension 01/22/2016   Lower abdominal pain 12/22/2014   Chronic idiopathic constipation 12/22/2014   Chronic bladder pain 11/08/2014   Glaucoma 11/08/2014   H/O vaginal hysterectomy 11/08/2014   Hypertension 11/08/2014   Migraine, unspecified, not intractable, without status migrainosus 11/08/2014   Nocturia 11/08/2014   Other premature beats 11/08/2014   Urge incontinence 11/08/2014   Urinary urgency 11/08/2014   Vaginal atrophy 11/08/2014   Atherosclerotic heart disease of native coronary artery without angina pectoris 04/13/2014   Type 2 diabetes mellitus with stage 3b chronic kidney disease, with long-term current use of insulin ([place]) 12/08/2013   Family history of ischemic heart disease (IHD) 08/16/2013   Ptosis 04/19/2012    REFERRING DIAG: R15.9 (ICD-10-CM) - Incontinence of feces, unspecified fecal incontinence type  THERAPY DIAG:  Muscle weakness (generalized)  Lower abdominal pain  Other lack of coordination  Rationale for Evaluation and Treatment Rehabilitation  PERTINENT HISTORY: CHF, Chronic obstructive pulmonary disease; Type 2 diabetes, Fibromyalgia, Glaucoma, Myocardial Infarction, PVD, Abdominal Hysterectomy, Pace maker  PRECAUTIONS: Pace maker  SUBJECTIVE: My stomach is grumbling. I used the Linzess and cleaned me out.   PAIN:  Are you having pain? Yes NPRS scale: 4-8/10 Pain location:  lower abdominal pain and upper is more bloating , around the surgical site   Pain type: like pelvis is opening up, pressure, achiness Pain description: constant    Aggravating factors: comes on randomly Relieving factors: take medication  BOWEL MOVEMENT Pain with bowel movement: No Type of bowel movement:Type (Bristol Stool Scale) Type 2, 3, 4 and Frequency 2-3 times per day Fully empty rectum: No  sometimes Leakage: Yes: when she passes gas, Pads: Yes: 2 pads per day Fiber supplement: Yes: Miralax   URINATION Pain with urination: No Fully empty bladder: Yes:   Stream: Strong Urgency: Yes:   Frequency: normal Leakage: Urge to void on occasion when she waits too long Pads: Yes: for fecal and urinary leakage     PATIENT GOALS figure out what keeps stomach sore , not leak stool with gas  OBJECTIVE: (objective measures completed at initial evaluation unless otherwise dated)  COGNITION:            Overall cognitive status: Within functional limits for tasks assessed                POSTURE:  Patient stands with big abdomen, reduced lumbar lordosis, forward head and rounded shoulders   LUMBARAROM/PROM   A/PROM A/PROM  eval  Flexion full  Extension Decreased by 50%  Right lateral flexion Decreased by 25%  Left lateral flexion Decreased by 25%  Right rotation Decreased  by 50%  Left rotation Decreased by 50%   (Blank rows = not tested)   LOWER EXTREMITY ROM:   Passive ROM Right eval Left eval  Hip flexion 105 85  Hip external rotation 40 50   (Blank rows = not tested)   LOWER EXTREMITY MMT:   MMT Right eval Left eval  Hip extension 3+/5 3+/5  3+/52/5  4/5  4/5  4/5    Hip abduction 2/5 2/5  Hip adduction 4/5 4/5  Hip internal rotation 4/5 4/5  Hip external rotation 4/5 4/5    PELVIC MMT: will be done at next visit. Therapist explained to patient what the exam of the pelvis entails to be prepared for the next .    MMT eval  Vaginal    Internal Anal Sphincter    External Anal Sphincter    Puborectalis    Diastasis Recti    (Blank rows = not tested)         PALPATION:   General  scar suprapubically is tight and sensitive, tender in the lower abdominal. Superior midline in abdomen, minimal abdominal contraction                 External Perineal Exam will be done at next visit                             Internal Pelvic Floor will be done at next  visit   T TODAY'S TREATMENT  05/13/2022 Manual: Soft tissue mobilization: Circular massage to abdomen to promote peristalic motion. Manual work to the diaphragm.  Scar tissue mobilization:scar massage to the lower abdomen to improve mobility.  Myofascial release: fascial release of the mesenteric root, release of the pubovesical ligament, release around the umbilicus, release of the upper lateral abdominal with one hand anterior and other posterior., release by the xiphoid process Self-care: Educated patient on abdominal massage    PATIENT EDUCATION: 05/13/2022 Education details: education on abdominal massage Person educated: Patient Education method: Explanation, Demonstration, Tactile cues, Verbal cues, and Handouts Education comprehension: verbalized understanding, returned demonstration, verbal cues required, tactile cues required, and needs further education       HOME EXERCISE PROGRAM: 05/13/2022 See above    ASSESSMENT:   CLINICAL IMPRESSION: Patient is a 71 y.o. female  who was seen today for physical therapy  treatment for fecal incontinence. Patient had to be on the phone with the doctors office about her medication. Therapy consisted of abdominal manual work so she could be on the phone. [place] had reduction of tenderness and increased softness of the abdomen after the manual work. Patient had increased mobility of the lower abdominal scar and less pain. Patients has difficulty with contracting her abdomen. Patient will benefit from skilled therapy to reduce her abdominal pain and reduce fecal and urinary leakage.      OBJECTIVE IMPAIRMENTS decreased activity tolerance, decreased coordination, decreased endurance, decreased strength, increased fascial restrictions, increased muscle spasms, and pain.    ACTIVITY LIMITATIONS  pass gas will leak stool , continence, toileting   PARTICIPATION LIMITATIONS:  when she has the urge   PERSONAL FACTORS CHF, Chronic obstructive  pulmonary disease; Type 2 diabetes, Fibromyalgia, Glaucoma, Myocardial Infarction, PVD, Abdominal Hysterectomy, Pace maker are also affecting patient's functional outcome.    REHAB POTENTIAL: Good   CLINICAL DECISION MAKING: Stable/uncomplicated   EVALUATION COMPLEXITY: Low     GOALS: Goals reviewed with patient? Yes   SHORT TERM GOALS: Target date:  06/05/2022   Patient understands how to perform abdominal massage to reduce pain and bloating.  Baseline: Goal status: INITIAL   2.  Patient pelvic floor is assessed to determine her strength.  Baseline:  Goal status: INITIAL     LONG TERM GOALS: Target date: 07/31/2022    Patient independent with HEP to include hip stretches, abdominal contraction, pelvic floor strength.  Baseline:  Goal status: INITIAL   2.  Patient reports her abdominal pain is </= 2/10 due to improved scar mobility and reduction of trigger points in the abdomen.  Baseline:  Goal status: INITIAL   3.  Bilateral hip flexion >/= 105 so she is able to sit on the toilet to have a bowel movement and fully empty her rectum.  Baseline:  Goal status: INITIAL   4.  Patient pelvic floor strength >/= 3/5 so her fecal leakage with gas reduced >/= 70%.  Baseline:  Goal status: INITIAL   5.  Patient pelvic floor strength >/= 3/5 so her urinary leakage is reduced >/= 70%.  Baseline:  Goal status: INITIAL     PLAN: PT FREQUENCY: 1x/week   PT DURATION: 12 weeks   PLANNED INTERVENTIONS: Therapeutic exercises, Therapeutic activity, Neuromuscular re-education, Patient/Family education, Joint mobilization, Dry Needling, Spinal mobilization, Cryotherapy, Moist heat, scar mobilization, Taping, Biofeedback, and Manual therapy   PLAN FOR NEXT SESSION: abdominal massage and educate patient on how to perform with scar massage; evaluate the pelvic floor, contraction of the abdominals   Earlie Counts, PT 05/13/22 3:57 PM

## 2022-05-14 NOTE — Progress Notes (Signed)
05/15/2022 [name] [id number] 1951-02-03  Referring provider: Leane Call, PA-C Primary GI doctor: Dr. Lavon Paganini  ASSESSMENT AND PLAN:   SBO (small bowel obstruction) [facility]) with history of partial colectomy and hysterectomy in the past Will try to get full records from the hospital but able to see CT report and had 2 transition points, likely adhesions.  Will check Xray, important to do low fiber diet, keep stool loose, avoid constipation ER precautions discussed Pending KUB may get CT scan versus referral to surgery.  -     DG Abd 1 View; Future  Chronic idiopathic constipation -     DG Abd 1 View; Future Started by PCP after last Xray showing no obstruction Repeat KUB, can consider continuing linzess before food base on KUB, if not helping can try trulance  History of hemi colectomy Due to diverticulitis 2019  High risk for surgical/endoscopic procedures. Patient with CAD s/p stents on plavix, CHF with AICD and currently with chronic angina Very high risk for surgical and endoscopic procedures     History of Present Illness:  71 y.o. female  with a past medical history of coronary artery disease s/p stent plavix, recently started on ranexa for chronic angina, ischemic cardiomyopathy with severe CHF s/p biventricular ICD placement 12/2020, DM, IDA intestinal AVMs, diverticular disease, s/p left hemicolectomy,  epiploic appendicitis, colon polyps and GERD, hiatal hernia, gastritis and others listed below, returns to clinic today for evaluation of nausea and lower AB pain.  03/20/2022 seen by Lucrezia Europe for bloating and GERD, advised low FODMAP diet, low fiber,  possible SIBO testing.  05/2021 CT AB and pelvis without contrast possible segment of small bowel wall thickening in right paramidline abdomen just below the umbilicus, stool throughout the colon 10/25/2018 EGD  gastritis, hiatal hernia.  Gastric biopsies negative for H. 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 Recall colonoscopy 5 years recommended  04/10/2022 admitted to [facility] with SBO, unable to see all reports but able to see CT AB with 2 transition points.  Saw surgeon in the hospital, was given one day with NG tube and then finally bowels started to move so she was able to be discharged.  04/29/2022 AB Xray with PCP for follow up shows no bowel obstruction.  She started on linzess  71 and states she has diarrhea with it x 1 weeks.  Patient states now she has increase stomach noises, she has loose stools. She has some nausea but no vomiting. Having right lower quadrant AB pain.  She has had hemicolectomy 2019, hysterectomy.    Current Medications:   Current Outpatient Medications (Endocrine & Metabolic):    FARXIGA 10 MG TABS tablet, TAKE 1 TABLET(10 MG) BY MOUTH DAILY BEFORE BREAKFAST   insulin lispro (HUMALOG) 100 UNIT/ML KwikPen, Inject 16 Units into the skin 3 (three) times daily. Administer 16 units three times daily before a meal per sliding scale   TRESIBA FLEXTOUCH 200 UNIT/ML FlexTouch Pen, Inject 40 Units into the skin 2 (two) times daily. 40 units in the morning and up to 45 units at night  Current Outpatient Medications (Cardiovascular):    carvedilol (COREG) 3.125 MG tablet, TAKE 1 TABLET(3.125 MG) BY MOUTH TWICE DAILY WITH A MEAL   nitroGLYCERIN (NITROSTAT) 0.4 MG SL tablet, Place 1 tablet (0.4 mg total) under the tongue every 5 (five) minutes  as needed for chest pain.   ranolazine (RANEXA) 500 MG 12 hr tablet, Take 1 tablet (500 mg total) by mouth 2 (two) times daily.   rosuvastatin (CRESTOR) 40 MG tablet, Take 40 mg by mouth daily.   sacubitril-valsartan (ENTRESTO) 24-26 MG, TAKE 1 TABLET BY MOUTH TWICE DAILY   torsemide (DEMADEX) 20 MG tablet, Take twice on  Tuesday and Thursday take once on other days   Vericiguat (VERQUVO) 5 MG TABS, Take 1 tablet (5 mg total) by mouth daily.  Current Outpatient Medications (Respiratory):    albuterol (PROVENTIL) (2.5 MG/3ML) 0.083% nebulizer solution, INL CONTENTS OF 1 VIAL VIA NEBULIZER Q 4 H PRF WHEEZING OR COUGH   albuterol (VENTOLIN HFA) 108 (90 Base) MCG/ACT inhaler, Inhale 2 puffs into the lungs every 6 (six) hours as needed for shortness of breath or wheezing.   fluticasone (FLONASE) 50 MCG/ACT nasal spray, Place 1 spray into both nostrils as needed for allergies or rhinitis.   loratadine (CLARITIN) 10 MG tablet, Take 10 mg by mouth daily.  Current Outpatient Medications (Analgesics):    acetaminophen (TYLENOL) 325 MG tablet, Take 2 tablets by mouth every 6 (six) hours as needed for pain.   ACETAMINOPHEN-BUTALBITAL 50-325 MG TABS, Take 1 tablet by mouth every 4 (four) hours as needed for severe pain.   aspirin 81 MG EC tablet, 81 mg daily.  Current Outpatient Medications (Hematological):    clopidogrel (PLAVIX) 75 MG tablet, TAKE 1 TABLET(75 MG) BY MOUTH DAILY   ferrous sulfate 324 MG TBEC, Take 324 mg by mouth daily with breakfast.   folic acid (FOLVITE) 1 MG tablet, Take 1 mg by mouth daily.   vitamin B-12 (CYANOCOBALAMIN) 1000 MCG tablet, Take 1,000 mcg by mouth daily.  Current Outpatient Medications (Other):    baclofen (LIORESAL) 10 MG tablet, Take 10 mg by mouth 2 (two) times daily.   diclofenac Sodium (VOLTAREN) 1 % GEL, Apply 2 g topically 2 (two) times daily as needed (pain).   dicyclomine (BENTYL) 10 MG capsule, TAKE 1 CAPSULE(10 MG) BY MOUTH IN THE MORNING AND AT BEDTIME   famotidine (PEPCID) 40 MG tablet, Take 40 mg by mouth 2 (two) times daily.   glycerin adult 2 g suppository, Place 1 suppository rectally as needed for constipation. Apply after bowel movements.   lidocaine (LIDODERM) 5 %, Place 1 patch onto the skin daily. Remove & Discard patch within 12 hours or as directed by MD    LINZESS 72 MCG capsule, Take 72 mcg by mouth every other day.   nystatin (MYCOSTATIN) 100000 UNIT/ML suspension, Take 5 mLs by mouth as needed.   pantoprazole (PROTONIX) 40 MG tablet, TAKE 1 TABLET(40 MG) BY MOUTH DAILY   pregabalin (LYRICA) 50 MG capsule, Take 50 mg by mouth 3 (three) times daily.   TRINTELLIX 5 MG TABS tablet, Take 5 mg by mouth at bedtime.   valACYclovir (VALTREX) 1000 MG tablet, Take 1,000 mg by mouth 2 (two) times daily as needed (flares).   Vitamin D, Ergocalciferol, (DRISDOL) 1.25 MG (50000 UNIT) CAPS capsule, Take 50,000 Units by mouth once a week. sundays  Surgical History:  She  has a past surgical history that includes Coronary angioplasty with stent (2020); Abdominal hysterectomy; Colon surgery; Blepharoplasty (Bilateral); Refractive surgery (Left); LEFT HEART CATH AND CORONARY ANGIOGRAPHY (N/A, 08/03/2020); CORONARY STENT INTERVENTION (N/A, 08/03/2020); INTRAVASCULAR PRESSURE WIRE/FFR STUDY (N/A, 08/03/2020); and BIV ICD INSERTION CRT-D (N/A, 12/31/2020). Family History:  Her family history includes Colon polyps in her mother; Coronary artery disease in her mother;  Diabetes in her daughter, maternal grandmother, and mother; Glaucoma in her mother; Heart disease in her father; Hypertension in her brother and mother; Lung cancer in her mother; Migraines in her father and son. Social History:   reports that she quit smoking about 2 years ago. Her smoking use included cigarettes. She has never used smokeless tobacco. She reports that she does not drink alcohol and does not use drugs.  Current Medications, Allergies, Past Medical History, Past Surgical History, Family History and Social History were reviewed in [employer] record.  Physical Exam: BP 104/70   Pulse 81   Ht 5\' 5"  (1.651 m)   Wt 193 lb (87.5 kg)   BMI 32.12 kg/m  General:   Pleasant, well developed female in no acute distress Heart : Regular rate and rhythm; no murmurs Pulm: Clear  anteriorly; no wheezing Abdomen:  Soft, Obese AB,  decreased bowel sounds upper and right lower quadrant, increase  bowel sounds left lower. mild tenderness in the LLQ. With guarding and Without rebound, No organomegaly appreciated. Rectal: Not evaluated Extremities:  without  edema. Neurologic:  Alert and  oriented x4;  No focal deficits.  Psych:  Cooperative. Normal mood and affect.   , PA-C 05/15/22

## 2022-05-15 ENCOUNTER — Ambulatory Visit (INDEPENDENT_AMBULATORY_CARE_PROVIDER_SITE_OTHER)
Admission: RE | Admit: 2022-05-15 | Discharge: 2022-05-15 | Disposition: A | Discharge status: Home / Self Care | Payer: Medicare Other | Source: Ambulatory Visit | Attending: Physician Assistant | Admitting: Physician Assistant

## 2022-05-15 ENCOUNTER — Ambulatory Visit (INDEPENDENT_AMBULATORY_CARE_PROVIDER_SITE_OTHER): Payer: Medicare Other | Admitting: Physician Assistant

## 2022-05-15 ENCOUNTER — Encounter: Payer: Self-pay | Admitting: Physician Assistant

## 2022-05-15 VITALS — BP 104/70 | HR 81 | Ht 65.0 in | Wt 193.0 lb

## 2022-05-15 DIAGNOSIS — I110 Hypertensive heart disease with heart failure: Secondary | ICD-10-CM

## 2022-05-15 DIAGNOSIS — K5904 Chronic idiopathic constipation: Secondary | ICD-10-CM | POA: Diagnosis not present

## 2022-05-15 DIAGNOSIS — K56609 Unspecified intestinal obstruction, unspecified as to partial versus complete obstruction: Secondary | ICD-10-CM

## 2022-05-15 DIAGNOSIS — Z9049 Acquired absence of other specified parts of digestive tract: Secondary | ICD-10-CM

## 2022-05-15 NOTE — Patient Instructions (Addendum)
If you are age 72 or older, your body mass index should be between 23-30. Your Body mass index is 32.12 kg/m. If this is out of the aforementioned range listed, please consider follow up with your Primary Care Provider. ________________________________________________________  The [facility] providers would like to encourage you to use [facility] to communicate with providers for non-urgent requests or questions.  Due to long hold times on the telephone, sending your provider a message by [facility] may be a faster and more efficient way to get a response.  Please allow 48 business hours for a response.  Please remember that this is for non-urgent requests.  _______________________________________________________   Your provider has requested that you go to the basement level for an Xray of your abdomen before leaving today.  Press "B" on the elevator.  The lab is located at the first door on the left as you exit the elevator.  Follow a Low Fiber Diet.  Follow up pending.  Thank you for entrusting me with your care and choosing [facility].  Quentin Mulling, PA-C   Need to keep stool loose [place]  Take at least 30 minutes before the first meal of the day on an empty stomach You can have a loose stool if you eat a high-fat breakfast. Give it at least 4 days, may have more bowel movements during that time.  If you continue to have severe diarrhea try every other day, if you get AB pain with it stop.  After you are out we can send in a prescription if you did well, there is a prescription card  If this does not help can try trulance.  May still need repeat CT scan pending xray May still want to do surgical referral.   Bowel Obstruction A bowel obstruction is a blockage in the small or large bowel. The bowel is also called the intestine. It is a long tube that connects the stomach to the anus. When a person eats and drinks, food and fluids go from the mouth to the stomach to the small  bowel. This is where most of the nutrients in the food and fluids are absorbed. After the small bowel, material passes through the large bowel for further absorption until any leftover material leaves the body as stool (feces) through the anus during a bowel movement. A bowel obstruction will prevent food and fluids from passing through the bowel as they normally do during digestion. The bowel can become partially or completely blocked. If this condition is not treated, it can be dangerous because the bowel could rupture. What are the causes? Common causes of this condition include: Scar tissue in the body (adhesions) from previous surgery or treatment with high-energy X-rays (radiation). Recent surgery. This may cause the movements of the bowel to slow down and cause food to block the intestine. Inflammatory bowel disease, such as Crohn's disease or diverticulitis. Growths or tumors. A bulging organ or tissue (hernia). Twisting of the bowel (volvulus). A swallowed object (foreign body). Slipping of a part of the bowel into another part (intussusception). What are the signs or symptoms? Symptoms of this condition include: Pain in the abdomen. Depending on the degree of obstruction, pain may be: Mild or severe. Dull cramping or sharp pain. In one area or in the entire abdomen. Nausea and vomiting. Vomit may be greenish or a yellow bile color. Bloating in the abdomen. Constipation. Being unable to pass gas. Frequent belching. Diarrhea. This may occur if the obstruction is partial and  runny stool is able to leak around the obstruction. How is this diagnosed? This condition may be diagnosed based on: A physical exam. Your medical history. Exams to look into the small intestine or the large intestine (endoscopy or colonoscopy). Imaging tests of the abdomen or pelvis, such as X-ray or CT scan. Blood or urine tests. How is this treated? Treatment for this condition depends on the cause and  severity of the problem. Treatment may include: Fluids and pain medicines that are given through an IV. Your health care provider may tell you not to eat or drink if you have nausea or vomiting. A clear liquid diet. You may be asked to consume a clear liquid diet for several days. This allows the bowel to rest. Placement of a small tube (nasogastric tube) through the nose, down the throat, and into the stomach. This may relieve pain, discomfort, and nausea by removing blocked air and fluids from the stomach. It can also help the obstruction clear up faster. Surgery. This may be required if other treatments do not work. Surgery may be required for: Bowel obstruction from a hernia. This can be an emergency procedure. Scar tissue that causes frequent or severe obstructions. Follow these instructions at home: Medicines Take over-the-counter and prescription medicines only as told by your health care provider. If you were prescribed an antibiotic medicine, take it as told by your health care provider. Do not stop taking the antibiotic even if you start to feel better. General instructions Follow instructions from your health care provider about eating and drinking restrictions. You may need to avoid solid foods and drink only clear liquids until your condition improves. Return to your normal activities as told by your health care provider. Ask your health care provider what activities are safe for you. Rest as told by your health care provider. Avoid sitting for a long time without moving. Get up to take short walks every 1-2 hours. This is important to improve blood flow and breathing. Ask for help if you feel weak or unsteady. Keep all follow-up visits. This is important. How is this prevented? After having a bowel obstruction, you are more likely to have another. You may do the following things to prevent another obstruction: If you have a long-term (chronic) disease, pay attention to your symptoms  and contact your health care provider if you have questions or concerns. Avoid becoming constipated. You may need to take these actions to prevent or treat constipation: Drink enough fluid to keep your urine pale yellow. Take over-the-counter or prescription medicines. Eat foods that are high in fiber, such as beans, whole grains, and fresh fruits and vegetables. Limit foods that are high in fat and processed sugars, such as fried or sweet foods. Stay active. Exercise for 30 minutes or more, 5 or more days each week. Ask your health care provider which exercises are safe for you. Avoid stress. Find ways to reduce stress, such as meditation, exercise, or taking time for activities that relax you. Instead of eating three large meals each day, eat three small meals with three small snacks. Work with a Data processing manager to make a healthy meal plan that works for you. Do not use any products that contain nicotine or tobacco. These products include cigarettes, chewing tobacco, and vaping devices, such as e-cigarettes. If you need help quitting, ask your health care provider. Contact a health care provider if: You have a fever. You have chills. Get help right away if: You have increased pain or cramping.  You vomit blood. You have uncontrolled vomiting or nausea. You cannot drink fluids because of vomiting or pain. You become confused. You begin feeling very thirsty (dehydrated). You have severe bloating. You feel extremely weak or you faint. Summary A bowel obstruction is a blockage in the small or large bowel. A bowel obstruction will prevent food and fluids from passing through the bowel as they normally do during digestion. Treatment for this condition depends on the cause and severity of the problem. It may include fluids and pain medicines through an IV, a simple diet, a nasogastric tube, or surgery. Follow instructions from your health care provider about eating restrictions. You may need to avoid  solid foods and consume only clear liquids until your condition improves. This information is not intended to replace advice given to you by your health care provider. Make sure you discuss any questions you have with your health care provider. Document Revised: 12/30/2020 Document Reviewed: 12/30/2020 Elsevier Patient Education  2023 [employer].

## 2022-05-26 ENCOUNTER — Other Ambulatory Visit: Payer: Self-pay | Admitting: Gastroenterology

## 2022-05-27 ENCOUNTER — Other Ambulatory Visit: Payer: Self-pay

## 2022-05-27 MED ORDER — NITROGLYCERIN 0.4 MG SL SUBL: 0.4000 mg | SUBLINGUAL_TABLET | SUBLINGUAL | 3 refills | Status: DC | PRN

## 2022-05-28 ENCOUNTER — Other Ambulatory Visit: Payer: Self-pay | Admitting: Nurse Practitioner

## 2022-05-29 ENCOUNTER — Encounter: Payer: Self-pay | Admitting: Physical Therapy

## 2022-05-29 ENCOUNTER — Encounter: Payer: Medicare Other | Admitting: Physical Therapy

## 2022-05-29 DIAGNOSIS — R1030 Lower abdominal pain, unspecified: Secondary | ICD-10-CM

## 2022-05-29 DIAGNOSIS — R278 Other lack of coordination: Secondary | ICD-10-CM

## 2022-05-29 DIAGNOSIS — M6281 Muscle weakness (generalized): Secondary | ICD-10-CM

## 2022-05-29 NOTE — Therapy (Signed)
OUTPATIENT PHYSICAL THERAPY TREATMENT NOTE   Patient Name: Karen Warner MRN: [id number] DOB:[birth year], 71 y.o., female Today's Date: 05/29/2022  PCP: Maggie Schwalbe, PA-C REFERRING PROVIDER: Willia Craze, NP  END OF SESSION:   PT End of Session - 05/29/22 1430     Visit Number 3    Date for PT Re-Evaluation 07/31/22    Authorization Type Medicare    Authorization - Visit Number 3    Authorization - Number of Visits 10    PT Start Time 1430   came late   PT Stop Time 1455    PT Time Calculation (min) 25 min    Activity Tolerance Patient tolerated treatment well    Behavior During Therapy [facility] for tasks assessed/performed             Past Medical History:  Diagnosis Date   Anemia 12/12/2019   Arthralgia 01/14/2020   Arthritis    Asthma    Atherosclerotic heart disease of native coronary artery without angina pectoris 04/13/2014   CHF (congestive heart failure) (HCC)    Chronic bilateral low back pain with bilateral sciatica 01/13/2019   Chronic bladder pain 11/08/2014   Last Assessment & Plan:  Formatting of this note might be different from the original. Patient has chronic pain syndrome in general I think this is unfortunately worsening her pelvic pain and bladder pain her examination today was very reassuring I am advised her to use the estrogen cream I did start her on Elavil 10 milligrams at that time if she does tolerated will increase it to 25 milligrams.    Chronic headaches    Chronic idiopathic constipation 12/22/2014   Formatting of this note might be different from the original. Last Assessment & Plan:  For better bowel emptying please use either Citrucel / Benefiber start with 2 tablespoons daily and titrate up or down to effect.  Also please use glycerin suppositories as needed to assist in evacuation. Last Assessment & Plan:  Formatting of this note might be different from the original. For better bowel empt   Chronic obstructive  pulmonary disease, unspecified ([place]) 03/18/2018   Class 1 obesity due to excess calories with serious comorbidity and body mass index (BMI) of 31.0 to 31.9 in adult 06/26/2020   Colon polyps    COPD (chronic obstructive pulmonary disease) (HCC)    Coronary artery disease    DDD (degenerative disc disease), cervical 01/13/2019   Depression 11/05/2019   Diabetic polyneuropathy associated with type 2 diabetes mellitus ([place]) 02/24/2019   Diverticulitis 05/20/2018   Diverticulitis of colon 03/18/2018   Family history of ischemic heart disease (IHD) 08/16/2013   Fibromyalgia affecting multiple sites 01/14/2020   Gastro-esophageal reflux disease without esophagitis 01/13/2019   Glaucoma 11/08/2014   Hordeolum externum of left upper eyelid 03/13/2020   Hypertension 11/08/2014   IBS (irritable bowel syndrome)    Iron deficiency anemia 01/13/2019   Left renal mass 11/08/2019   Leukocytosis 05/28/2018   Low vitamin B12 level 03/13/2020   Low vitamin D level 12/12/2019   LV dysfunction 05/31/2019   Malignant essential hypertension 01/22/2016   Migraine, unspecified, not intractable, without status migrainosus 11/08/2014   Mixed hyperlipidemia 01/13/2019   Myocardial infarction ([place])    Other premature beats 11/08/2014   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 Last Assessment & Plan:  For vaginal atrophy please place a pea size dab of Estrogen vaginal cream  into the vagina 3 times a week ( Monday, Wednesday, Friday) Last Assessment & Plan:  Formatting of this note might be different from the original. For vaginal atrophy please  place a pea size dab of Estrogen vaginal cream  into the vagina    Past Surgical History:  Procedure Laterality Date   ABDOMINAL HYSTERECTOMY     BIV ICD INSERTION CRT-D N/A 12/31/2020   Procedure: BIV ICD INSERTION CRT-D;  Surgeon: Evans Lance, MD;  Location: [place] [facility];  Service: Cardiovascular;  Laterality: N/A;   BLEPHAROPLASTY Bilateral    COLON SURGERY     CORONARY ANGIOPLASTY WITH STENT PLACEMENT  2020   X3   CORONARY STENT INTERVENTION N/A 08/03/2020   Procedure: CORONARY STENT INTERVENTION;  Surgeon: Sherren Mocha, MD;  Location: [place] [facility];  Service: Cardiovascular;  Laterality: N/A;   INTRAVASCULAR PRESSURE WIRE/FFR STUDY N/A 08/03/2020   Procedure: INTRAVASCULAR PRESSURE WIRE/FFR STUDY;  Surgeon: Sherren Mocha, MD;  Location: [place] [facility];  Service: Cardiovascular;  Laterality: N/A;   LEFT HEART CATH AND CORONARY ANGIOGRAPHY N/A 08/03/2020   Procedure: LEFT HEART CATH AND CORONARY ANGIOGRAPHY;  Surgeon: Sherren Mocha, MD;  Location: [place] [facility];  Service: Cardiovascular;  Laterality: N/A;   REFRACTIVE SURGERY Left    [place]   Patient Active Problem List   Diagnosis Date Noted   Biventricular ICD (implantable cardioverter-defibrillator) in place 04/03/2021   Obstructive sleep apnea 03/15/2021   COPD (chronic obstructive pulmonary disease) (HCC)    Colon polyps    Chronic headaches    CHF (congestive heart failure) (HCC)    Asthma    Arthritis    Anemia in chronic kidney disease 02/19/2021   IBS (irritable bowel syndrome)    Syncope and collapse 12/26/2020   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   Orthostatic hypotension 11/21/2020   Daytime sleepiness 11/20/2020   Hypertensive heart disease with heart failure ([place]) 11/17/2020   Syncope 11/17/2020   Herpes simplex vulvovaginitis 11/02/2020   Snoring 10/17/2020   Depressed left ventricular ejection fraction 10/16/2020   Left bundle branch block 10/16/2020   Nasal congestion 09/30/2020    Coronary artery disease    Myocardial infarction [facility])    Unstable angina ([place]) 08/03/2020   Ischemic cardiomyopathy 08/03/2020   Non-ST elevation (NSTEMI) myocardial infarction ([place]) 08/03/2020   Obesity (BMI 30-39.9) 06/26/2020   Pustular rash 05/14/2020   Torticollis, acute 04/11/2020   Hordeolum externum of left upper eyelid 03/13/2020   Low vitamin B12 level 03/13/2020   Pain of right great toe 01/24/2020   Arthralgia 01/14/2020   Fibromyalgia affecting multiple sites 01/14/2020   Diarrhea of presumed infectious origin 01/07/2020   Ingrown nail 12/27/2019   History of anemia 12/12/2019   Low hemoglobin 12/12/2019   Low vitamin D level 12/12/2019   Anemia 12/12/2019   Long-term use of aspirin therapy 11/15/2019   Left renal mass 11/08/2019   Depression 11/05/2019   Neuropathy 11/05/2019   Other fatigue 09/12/2019   Rhinosinusitis 09/12/2019   Hypertensive emergency 08/06/2019   Left ventricular dysfunction 05/31/2019   LV dysfunction 05/31/2019   Trochanteric bursitis of right hip 04/20/2019   Aftercare following surgery 04/12/2019   Injury of toenail of  left foot 04/06/2019   Moderate episode of recurrent major depressive disorder ([place]) 03/29/2019   Diabetic polyneuropathy associated with type 2 diabetes mellitus ([place]) 02/24/2019   History of disseminated herpes simplex 02/22/2019   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 ([place]) 41/93/7902   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 ([place]) 02/22/2019   Seasonal allergies 02/22/2019   Encounter to establish care 01/23/2019   Vitamin D deficiency 01/23/2019   Chronic bilateral low back pain with bilateral sciatica 01/13/2019   DDD (degenerative disc disease), cervical 01/13/2019   Gastro-esophageal reflux disease without esophagitis 01/13/2019   History of intestinal surgery 01/13/2019   Iron deficiency anemia 01/13/2019   Mixed hyperlipidemia 01/13/2019   Tobacco abuse 01/13/2019   Leukocytosis 05/28/2018    Diverticulitis 05/20/2018   Chronic obstructive pulmonary disease, unspecified ([place]) 03/18/2018   Diverticulitis of colon 03/18/2018   PVD (peripheral vascular disease) ([place]) 04/08/2017   Chest pain 04/07/2017   Malignant essential hypertension 01/22/2016   Lower abdominal pain 12/22/2014   Chronic idiopathic constipation 12/22/2014   Chronic bladder pain 11/08/2014   Glaucoma 11/08/2014   H/O vaginal hysterectomy 11/08/2014   Hypertension 11/08/2014   Migraine, unspecified, not intractable, without status migrainosus 11/08/2014   Nocturia 11/08/2014   Other premature beats 11/08/2014   Urge incontinence 11/08/2014   Urinary urgency 11/08/2014   Vaginal atrophy 11/08/2014   Atherosclerotic heart disease of native coronary artery without angina pectoris 04/13/2014   Type 2 diabetes mellitus with stage 3b chronic kidney disease, with long-term current use of insulin ([place]) 12/08/2013   Family history of ischemic heart disease (IHD) 08/16/2013   Ptosis 04/19/2012   REFERRING DIAG: R15.9 (ICD-10-CM) - Incontinence of feces, unspecified fecal incontinence type   THERAPY DIAG:  Muscle weakness (generalized)   Lower abdominal pain   Other lack of coordination   Rationale for Evaluation and Treatment Rehabilitation   PERTINENT HISTORY: CHF, Chronic obstructive pulmonary disease; Type 2 diabetes, Fibromyalgia, Glaucoma, Myocardial Infarction, PVD, Abdominal Hysterectomy, Pace maker   PRECAUTIONS: Pace maker   SUBJECTIVE: Abdominal soreness is 50% better. Linzess is flushing out my system. No stool leakage since last visit. Regulate when taking the Linzess due to the urge so strong and may get stool in underwear.    PAIN:  Are you having pain? Yes NPRS scale: 4-8/10 Pain location:  lower abdominal pain and upper is more bloating , around the surgical site   Pain type: like pelvis is opening up, pressure, achiness Pain description: constant    Aggravating factors: comes on  randomly Relieving factors: take medication   BOWEL MOVEMENT Pain with bowel movement: No Type of bowel movement:Type (Bristol Stool Scale) Type 2, 3, 4 and Frequency 2-3 times per day Fully empty rectum: No sometimes Leakage: Yes: when she passes gas, Pads: Yes: 2 pads per day Fiber supplement: Yes: Miralax   URINATION Pain with urination: No Fully empty bladder: Yes:   Stream: Strong Urgency: Yes:   Frequency: normal Leakage: Urge to void on occasion when she waits too long Pads: Yes: for fecal and urinary leakage     PATIENT GOALS figure out what keeps stomach sore , not leak stool with gas   OBJECTIVE: (objective measures completed at initial evaluation unless otherwise dated)   COGNITION:            Overall cognitive status: Within functional limits for tasks assessed                POSTURE:  Patient stands with big abdomen, reduced  lumbar lordosis, forward head and rounded shoulders   LUMBARAROM/PROM   A/PROM A/PROM  eval  Flexion full  Extension Decreased by 50%  Right lateral flexion Decreased by 25%  Left lateral flexion Decreased by 25%  Right rotation Decreased by 50%  Left rotation Decreased by 50%   (Blank rows = not tested)   LOWER EXTREMITY ROM:   Passive ROM Right eval Left eval  Hip flexion 105 85  Hip external rotation 40 50   (Blank rows = not tested)   LOWER EXTREMITY MMT:       MMT Right eval Left eval  Hip extension 3+/5 3+/5  Hip abduction 2/5 2/5  Hip adduction 4/5 4/5  Hip internal rotation 4/5 4/5  Hip external rotation 4/5 4/5    PELVIC MMT: will be done at next visit. Therapist explained to patient what the exam of the pelvis entails to be prepared for the next .    MMT eval  Vaginal    Internal Anal Sphincter    External Anal Sphincter    Puborectalis    Diastasis Recti    (Blank rows = not tested)         PALPATION:   General  scar suprapubically is tight and sensitive, tender in the lower abdominal. Superior  midline in abdomen, minimal abdominal contraction                 External Perineal Exam will be done at next visit                             Internal Pelvic Floor will be done at next visit   T TODAY'S TREATMENT  05/29/2022 Neuromuscular re-education: Core facilitation: diaphragmatic breathing with lower abdominal contraction 15x    Resisted hip flexion 5 times on right and left 3 times and stopped due to left lower abdominal pain    05/13/2022 Manual: Soft tissue mobilization: Circular massage to abdomen to promote peristalic motion. Manual work to the diaphragm.  Scar tissue mobilization:scar massage to the lower abdomen to improve mobility.  Myofascial release: fascial release of the mesenteric root, release of the pubovesical ligament, release around the umbilicus, release of the upper lateral abdominal with one hand anterior and other posterior., release by the xiphoid process Self-care: Educated patient on abdominal massage     PATIENT EDUCATION: Education details:  Access Code: [email or web address] Person educated: Patient Education method: Explanation, Demonstration, Corporate treasurer cues, Verbal cues, and Handouts Education comprehension: verbalized understanding, returned demonstration, verbal cues required, tactile cues required, and needs further education       HOME EXERCISE PROGRAM: 05/29/2022  Access Code: [email or web address] URL: [email or web address] Date: 05/29/2022 Prepared by: Earlie Counts  Exercises - Supine Diaphragmatic Breathing  - 3 x daily - 7 x weekly - 1 sets - 10 reps   ASSESSMENT:   CLINICAL IMPRESSION: Patient is a 71 y.o. female  who was seen today for physical therapy  treatment for fecal incontinence. Patient was significantly late today due to being delayed. She was able to do the diaphragmatic breathing with abdominal bracing on the end of breath. She is not able to do left hip flexion isometric due to pain in left lower quadrant. Patient has not had fecal  leakage since last visit. She reports her abdominal soreness is 50% better.  Patient will benefit from skilled therapy to reduce her abdominal pain and reduce fecal and urinary leakage.      OBJECTIVE IMPAIRMENTS  decreased activity tolerance, decreased coordination, decreased endurance, decreased strength, increased fascial restrictions, increased muscle spasms, and pain.    ACTIVITY LIMITATIONS  pass gas will leak stool , continence, toileting   PARTICIPATION LIMITATIONS:  when she has the urge   PERSONAL FACTORS CHF, Chronic obstructive pulmonary disease; Type 2 diabetes, Fibromyalgia, Glaucoma, Myocardial Infarction, PVD, Abdominal Hysterectomy, Pace maker are also affecting patient's functional outcome.    REHAB POTENTIAL: Good   CLINICAL DECISION MAKING: Stable/uncomplicated   EVALUATION COMPLEXITY: Low     GOALS: Goals reviewed with patient? Yes   SHORT TERM GOALS: Target date: 06/05/2022   Patient understands how to perform abdominal massage to reduce pain and bloating.  Baseline: Goal status: met 05/29/2022  2.  Patient pelvic floor is assessed to determine her strength.  Baseline:  Goal status: INITIAL     LONG TERM GOALS: Target date: 07/31/2022    Patient independent with HEP to include hip stretches, abdominal contraction, pelvic floor strength.  Baseline:  Goal status: INITIAL   2.  Patient reports her abdominal pain is </= 2/10 due to improved scar mobility and reduction of trigger points in the abdomen.  Baseline:  Goal status: INITIAL   3.  Bilateral hip flexion >/= 105 so she is able to sit on the toilet to have a bowel movement and fully empty her rectum.  Baseline:  Goal status: INITIAL   4.  Patient pelvic floor strength >/= 3/5 so her fecal leakage with gas reduced >/= 70%.  Baseline:  Goal status: INITIAL   5.  Patient pelvic floor strength >/= 3/5 so her urinary leakage is reduced >/= 70%.  Baseline:  Goal status: INITIAL     PLAN: PT  FREQUENCY: 1x/week   PT DURATION: 12 weeks   PLANNED INTERVENTIONS: Therapeutic exercises, Therapeutic activity, Neuromuscular re-education, Patient/Family education, Joint mobilization, Dry Needling, Spinal mobilization, Cryotherapy, Moist heat, scar mobilization, Taping, Biofeedback, and Manual therapy   PLAN FOR NEXT SESSION: abdominal massage and educate patient on how to perform with scar massage; evaluate the pelvic floor, contraction of the abdominals     Earlie Counts, PT 05/29/22 3:01 PM

## 2022-06-01 DIAGNOSIS — R079 Chest pain, unspecified: Secondary | ICD-10-CM

## 2022-06-05 ENCOUNTER — Encounter: Payer: Self-pay | Admitting: Physical Therapy

## 2022-06-05 ENCOUNTER — Encounter: Payer: Medicare Other | Attending: Nurse Practitioner | Admitting: Physical Therapy

## 2022-06-05 DIAGNOSIS — M6281 Muscle weakness (generalized): Secondary | ICD-10-CM | POA: Diagnosis present

## 2022-06-05 DIAGNOSIS — R278 Other lack of coordination: Secondary | ICD-10-CM | POA: Diagnosis not present

## 2022-06-05 DIAGNOSIS — R1030 Lower abdominal pain, unspecified: Secondary | ICD-10-CM | POA: Diagnosis not present

## 2022-06-05 DIAGNOSIS — R159 Full incontinence of feces: Secondary | ICD-10-CM | POA: Insufficient documentation

## 2022-06-05 NOTE — Therapy (Signed)
OUTPATIENT PHYSICAL THERAPY TREATMENT NOTE   Patient Name: Karen Warner MRN: [id number] DOB:12/04/1950, 71 y.o., female Today's Date: 06/05/2022  PCP: Nodal, Jr Reinaldo, PA-C REFERRING PROVIDER: Guenther, Paula M, NP  END OF SESSION:   PT End of Session - 06/05/22 1408     Visit Number 4    Date for PT Re-Evaluation 07/31/22    Authorization Type Medicare    Authorization - Visit Number 4    Authorization - Number of Visits 10    PT Start Time 1400    PT Stop Time 1445    PT Time Calculation (min) 45 min    Activity Tolerance Patient tolerated treatment well    Behavior During Therapy WFL for tasks assessed/performed             Past Medical History:  Diagnosis Date   Anemia 12/12/2019   Arthralgia 01/14/2020   Arthritis    Asthma    Atherosclerotic heart disease of native coronary artery without angina pectoris 04/13/2014   CHF (congestive heart failure) (HCC)    Chronic bilateral low back pain with bilateral sciatica 01/13/2019   Chronic bladder pain 11/08/2014   Last Assessment & Plan:  Formatting of this note might be different from the original. Patient has chronic pain syndrome in general I think this is unfortunately worsening her pelvic pain and bladder pain her examination today was very reassuring I am advised her to use the estrogen cream I did start her on Elavil 10 milligrams at that time if she does tolerated will increase it to 25 milligrams.    Chronic headaches    Chronic idiopathic constipation 12/22/2014   Formatting of this note might be different from the original. Last Assessment & Plan:  For better bowel emptying please use either Citrucel / Benefiber start with 2 tablespoons daily and titrate up or down to effect.  Also please use glycerin suppositories as needed to assist in evacuation. Last Assessment & Plan:  Formatting of this note might be different from the original. For better bowel empt   Chronic obstructive pulmonary disease,  unspecified (HCC) 03/18/2018   Class 1 obesity due to excess calories with serious comorbidity and body mass index (BMI) of 31.0 to 31.9 in adult 06/26/2020   Colon polyps    COPD (chronic obstructive pulmonary disease) (HCC)    Coronary artery disease    DDD (degenerative disc disease), cervical 01/13/2019   Depression 11/05/2019   Diabetic polyneuropathy associated with type 2 diabetes mellitus (HCC) 02/24/2019   Diverticulitis 05/20/2018   Diverticulitis of colon 03/18/2018   Family history of ischemic heart disease (IHD) 08/16/2013   Fibromyalgia affecting multiple sites 01/14/2020   Gastro-esophageal reflux disease without esophagitis 01/13/2019   Glaucoma 11/08/2014   Hordeolum externum of left upper eyelid 03/13/2020   Hypertension 11/08/2014   IBS (irritable bowel syndrome)    Iron deficiency anemia 01/13/2019   Left renal mass 11/08/2019   Leukocytosis 05/28/2018   Low vitamin B12 level 03/13/2020   Low vitamin D level 12/12/2019   LV dysfunction 05/31/2019   Malignant essential hypertension 01/22/2016   Migraine, unspecified, not intractable, without status migrainosus 11/08/2014   Mixed hyperlipidemia 01/13/2019   Myocardial infarction (HCC)    Other premature beats 11/08/2014   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 Last Assessment & Plan:  For vaginal atrophy please place a pea size dab of Estrogen vaginal cream  into the vagina 3 times a week ( Monday, Wednesday, Friday) Last Assessment & Plan:  Formatting of this note might be different from the original. For vaginal atrophy please place a pea size dab  of Estrogen vaginal cream  into the vagina    Past Surgical History:  Procedure Laterality Date   ABDOMINAL HYSTERECTOMY     BIV ICD INSERTION CRT-D N/A 12/31/2020   Procedure: BIV ICD INSERTION CRT-D;  Surgeon: Taylor, Gregg W, MD;  Location: [facility];  Service: Cardiovascular;  Laterality: N/A;   BLEPHAROPLASTY Bilateral    COLON SURGERY     CORONARY ANGIOPLASTY WITH STENT PLACEMENT  2020   X3   CORONARY STENT INTERVENTION N/A 08/03/2020   Procedure: CORONARY STENT INTERVENTION;  Surgeon: Cooper, Michael, MD;  Location: [facility];  Service: Cardiovascular;  Laterality: N/A;   INTRAVASCULAR PRESSURE WIRE/FFR STUDY N/A 08/03/2020   Procedure: INTRAVASCULAR PRESSURE WIRE/FFR STUDY;  Surgeon: Cooper, Michael, MD;  Location: [facility];  Service: Cardiovascular;  Laterality: N/A;   LEFT HEART CATH AND CORONARY ANGIOGRAPHY N/A 08/03/2020   Procedure: LEFT HEART CATH AND CORONARY ANGIOGRAPHY;  Surgeon: Cooper, Michael, MD;  Location: [facility];  Service: Cardiovascular;  Laterality: N/A;   REFRACTIVE SURGERY Left    [place]   Patient Active Problem List   Diagnosis Date Noted   Biventricular ICD (implantable cardioverter-defibrillator) in place 04/03/2021   Obstructive sleep apnea 03/15/2021   COPD (chronic obstructive pulmonary disease) (HCC)    Colon polyps    Chronic headaches    CHF (congestive heart failure) (HCC)    Asthma    Arthritis    Anemia in chronic kidney disease 02/19/2021   IBS (irritable bowel syndrome)    Syncope and collapse 12/26/2020   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   Orthostatic hypotension 11/21/2020   Daytime sleepiness 11/20/2020   Hypertensive heart disease with heart failure (HCC) 11/17/2020   Syncope 11/17/2020   Herpes simplex vulvovaginitis 11/02/2020   Snoring 10/17/2020   Depressed left ventricular ejection fraction 10/16/2020   Left bundle branch block 10/16/2020   Nasal congestion 09/30/2020   Coronary artery  disease    Myocardial infarction (HCC)    Unstable angina (HCC) 08/03/2020   Ischemic cardiomyopathy 08/03/2020   Non-ST elevation (NSTEMI) myocardial infarction (HCC) 08/03/2020   Obesity (BMI 30-39.9) 06/26/2020   Pustular rash 05/14/2020   Torticollis, acute 04/11/2020   Hordeolum externum of left upper eyelid 03/13/2020   Low vitamin B12 level 03/13/2020   Pain of right great toe 01/24/2020   Arthralgia 01/14/2020   Fibromyalgia affecting multiple sites 01/14/2020   Diarrhea of presumed infectious origin 01/07/2020   Ingrown nail 12/27/2019   History of anemia 12/12/2019   Low hemoglobin 12/12/2019   Low vitamin D level 12/12/2019   Anemia 12/12/2019   Long-term use of aspirin therapy 11/15/2019   Left renal mass 11/08/2019   Depression 11/05/2019   Neuropathy 11/05/2019   Other fatigue 09/12/2019   Rhinosinusitis 09/12/2019   Hypertensive emergency 08/06/2019   Left ventricular dysfunction 05/31/2019   LV dysfunction 05/31/2019   Trochanteric bursitis of right hip 04/20/2019   Aftercare following surgery 04/12/2019   Injury of toenail of left foot 04/06/2019     Moderate episode of recurrent major depressive disorder (HCC) 03/29/2019   Diabetic polyneuropathy associated with type 2 diabetes mellitus (HCC) 02/24/2019   History of disseminated herpes simplex 02/22/2019   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) 02/22/2019   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) 02/22/2019   Seasonal allergies 02/22/2019   Encounter to establish care 01/23/2019   Vitamin D deficiency 01/23/2019   Chronic bilateral low back pain with bilateral sciatica 01/13/2019   DDD (degenerative disc disease), cervical 01/13/2019   Gastro-esophageal reflux disease without esophagitis 01/13/2019   History of intestinal surgery 01/13/2019   Iron deficiency anemia 01/13/2019   Mixed hyperlipidemia 01/13/2019   Tobacco abuse 01/13/2019   Leukocytosis 05/28/2018   Diverticulitis 05/20/2018    Chronic obstructive pulmonary disease, unspecified (HCC) 03/18/2018   Diverticulitis of colon 03/18/2018   PVD (peripheral vascular disease) (HCC) 04/08/2017   Chest pain 04/07/2017   Malignant essential hypertension 01/22/2016   Lower abdominal pain 12/22/2014   Chronic idiopathic constipation 12/22/2014   Chronic bladder pain 11/08/2014   Glaucoma 11/08/2014   H/O vaginal hysterectomy 11/08/2014   Hypertension 11/08/2014   Migraine, unspecified, not intractable, without status migrainosus 11/08/2014   Nocturia 11/08/2014   Other premature beats 11/08/2014   Urge incontinence 11/08/2014   Urinary urgency 11/08/2014   Vaginal atrophy 11/08/2014   Atherosclerotic heart disease of native coronary artery without angina pectoris 04/13/2014   Type 2 diabetes mellitus with stage 3b chronic kidney disease, with long-term current use of insulin (HCC) 12/08/2013   Family history of ischemic heart disease (IHD) 08/16/2013   Ptosis 04/19/2012  REFERRING DIAG: R15.9 (ICD-10-CM) - Incontinence of feces, unspecified fecal incontinence type   THERAPY DIAG:  Muscle weakness (generalized)   Lower abdominal pain   Other lack of coordination   Rationale for Evaluation and Treatment Rehabilitation   PERTINENT HISTORY: CHF, Chronic obstructive pulmonary disease; Type 2 diabetes, Fibromyalgia, Glaucoma, Myocardial Infarction, PVD, Abdominal Hysterectomy, Pace maker   PRECAUTIONS: Pace maker   SUBJECTIVE: I went into the hospital 05/31/2022 due to congested failure with trouble breathing and chest pain. They changed my medication. I feel tired now. I stayed for 4 days. When in the hospital did not have a bowel movement for 3 days. I am back on the linzess.   PAIN:  Are you having pain? Yes NPRS scale: 4-8/10 Pain location:  lower abdominal pain and upper is more bloating , around the surgical site   Pain type: like pelvis is opening up, pressure, achiness Pain description: constant    Aggravating  factors: comes on randomly Relieving factors: take medication   BOWEL MOVEMENT Pain with bowel movement: No Type of bowel movement:Type (Bristol Stool Scale) Type 2, 3, 4 and Frequency 2-3 times per day Fully empty rectum: No sometimes Leakage: Yes: when she passes gas, Pads: Yes: 2 pads per day Fiber supplement: Yes: Miralax   URINATION Pain with urination: No Fully empty bladder: Yes:   Stream: Strong Urgency: Yes:   Frequency: normal Leakage: Urge to void on occasion when she waits too long Pads: Yes: for fecal and urinary leakage     PATIENT GOALS figure out what keeps stomach sore , not leak stool with gas   OBJECTIVE: (objective measures completed at initial evaluation unless otherwise dated)   COGNITION:            Overall cognitive status: Within functional limits for tasks assessed                POSTURE:    Patient stands with big abdomen, reduced lumbar lordosis, forward head and rounded shoulders   LUMBARAROM/PROM   A/PROM A/PROM  eval A/ROM 06/05/2022  Flexion full full  Extension Decreased by 50% Decreased by 25%  Right lateral flexion Decreased by 25% Decreased by 25%  Left lateral flexion Decreased by 25% Decreased by 25%  Right rotation Decreased by 50% Decreased by 25%  Left rotation Decreased by 50% Decreased by 25%   (Blank rows = not tested)   LOWER EXTREMITY ROM:   Passive ROM Right eval Left eval  Hip flexion 105 85  Hip external rotation 40 50   (Blank rows = not tested)   LOWER EXTREMITY MMT:          MMT Right eval Left eval  Hip extension 3+/5 3+/5  Hip abduction 2/5 2/5  Hip adduction 4/5 4/5  Hip internal rotation 4/5 4/5  Hip external rotation 4/5 4/5    PELVIC MMT: will be done at next visit. Therapist explained to patient what the exam of the pelvis entails to be prepared for the next .    MMT eval  Vaginal    Internal Anal Sphincter    External Anal Sphincter    Puborectalis    Diastasis Recti    (Blank rows = not  tested)         PALPATION:   General  scar suprapubically is tight and sensitive, tender in the lower abdominal. Superior midline in abdomen, minimal abdominal contraction                 External Perineal Exam will be done at next visit                             Internal Pelvic Floor will be done at next visit   T TODAY'S TREATMENT   06/05/2022 Manual: Soft tissue mobilization:Circular massage to abdomen to promote peristalic motion. Manual work to the diaphragm.  Scar tissue mobilization:scar massage to the lower abdomen to improve mobility.  Myofascial release:fascial release of the mesenteric root, release of the pubovesical ligament, release around the umbilicus, release of the upper lateral abdominal with one hand anterior and other posterior., release by the xiphoid process    05/29/2022 Neuromuscular re-education: Core facilitation: diaphragmatic breathing with lower abdominal contraction 15x                                    Resisted hip flexion 5 times on right and left 3 times and stopped due to left lower abdominal pain     05/13/2022 Manual: Soft tissue mobilization: Circular massage to abdomen to promote peristalic motion. Manual work to the diaphragm.  Scar tissue mobilization:scar massage to the lower abdomen to improve mobility.  Myofascial release: fascial release of the mesenteric root, release of the pubovesical ligament, release around the umbilicus, release of the upper lateral abdominal with one hand anterior and other posterior., release by the xiphoid process Self-care: Educated patient on abdominal massage      PATIENT EDUCATION: Education details:  Access Code: [email or web address] Person educated: Patient Education method: Explanation, Demonstration, Corporate treasurer cues, Verbal cues, and Handouts Education comprehension: verbalized understanding, returned demonstration, verbal cues required, tactile cues required, and needs further education       HOME EXERCISE  PROGRAM: 05/29/2022  Access Code: [email or web address] URL: [email or web address] Date: 05/29/2022 Prepared by: Earlie Counts  Exercises - Supine Diaphragmatic Breathing  - 3 x daily - 7 x weekly - 1 sets - 10 reps   ASSESSMENT:   CLINICAL IMPRESSION: Patient is a 71 y.o. female  who was seen today for physical therapy  treatment for fecal incontinence. Patient was in the hospital for 4 days this week due to her congested heart failure. She was groggy during therapy and would fall asleep during the manual work. Today in therapy kept it easy. She was having difficulty with constipation the past several days and is now back on her Linzes. She reports she is not having much stool but that is due to decreased food intake while in the hospital and recovering. Work on the tissue mobilization to improve toileting.   She has increased in lumbar ROM. Pelvic floor not assessed at this time due to patient deferring. Patient will benefit from skilled therapy to reduce her abdominal pain and reduce fecal and urinary leakage.      OBJECTIVE IMPAIRMENTS decreased activity tolerance, decreased coordination, decreased endurance, decreased strength, increased fascial restrictions, increased muscle spasms, and pain.    ACTIVITY LIMITATIONS  pass gas will leak stool , continence, toileting   PARTICIPATION LIMITATIONS:  when she has the urge   PERSONAL FACTORS CHF, Chronic obstructive pulmonary disease; Type 2 diabetes, Fibromyalgia, Glaucoma, Myocardial Infarction, PVD, Abdominal Hysterectomy, Pace maker are also affecting patient's functional outcome.    REHAB POTENTIAL: Good   CLINICAL DECISION MAKING: Stable/uncomplicated   EVALUATION COMPLEXITY: Low     GOALS: Goals reviewed with patient? Yes   SHORT TERM GOALS: Target date: 06/05/2022   Patient understands how to perform abdominal massage to reduce pain and bloating.  Baseline: Goal status: met 05/29/2022  2.  Patient pelvic floor is assessed to  determine her strength.  Baseline:  Goal status: INITIAL     LONG TERM GOALS: Target date: 07/31/2022    Patient independent with HEP to include hip stretches, abdominal contraction, pelvic floor strength.  Baseline:  Goal status: INITIAL   2.  Patient reports her abdominal pain is </= 2/10 due to improved scar mobility and reduction of trigger points in the abdomen.  Baseline:  Goal status: INITIAL   3.  Bilateral hip flexion >/= 105 so she is able to sit on the toilet to have a bowel movement and fully empty her rectum.  Baseline:  Goal status: INITIAL   4.  Patient pelvic floor strength >/= 3/5 so her fecal leakage with gas reduced >/= 70%.  Baseline:  Goal status: INITIAL   5.  Patient pelvic floor strength >/= 3/5 so her urinary leakage is reduced >/= 70%.  Baseline:  Goal status: INITIAL     PLAN: PT FREQUENCY: 1x/week   PT DURATION: 12 weeks   PLANNED INTERVENTIONS: Therapeutic exercises, Therapeutic activity, Neuromuscular re-education, Patient/Family education, Joint mobilization, Dry Needling, Spinal mobilization, Cryotherapy, Moist heat, scar mobilization, Taping, Biofeedback, and Manual therapy   PLAN FOR NEXT SESSION: abdominal massage and educate patient on how to perform with scar massage; evaluate the pelvic floor, contraction of the abdominals, measure hip flexion on the right.    Earlie Counts, PT 06/05/22 3:10 PM

## 2022-06-09 ENCOUNTER — Telehealth: Payer: Self-pay | Admitting: Cardiology

## 2022-06-09 ENCOUNTER — Telehealth: Payer: Self-pay

## 2022-06-09 NOTE — Telephone Encounter (Signed)
Patient calling to speak with jasmine. She says she was approved by [facility], but also needs to speak about her recent hospital visit.

## 2022-06-09 NOTE — Telephone Encounter (Signed)
Judeth Cornfield at [facility] will fax medical records for patient July 1-4 stay.

## 2022-06-09 NOTE — Telephone Encounter (Signed)
Patient stated [employer] has approved her verquvo. She also wanted to inform clinic that she was in [facility] from July 1 to July 4 for "heart failure." At present, she denies sob, chest pain, edema. Left message for Marcelino Duster (medical records at [facility].  [phone]) to return my call.

## 2022-06-12 ENCOUNTER — Encounter: Payer: Self-pay | Admitting: Physical Therapy

## 2022-06-19 ENCOUNTER — Encounter: Payer: Self-pay | Admitting: Physical Therapy

## 2022-06-19 ENCOUNTER — Encounter: Payer: Medicare Other | Admitting: Physical Therapy

## 2022-06-19 DIAGNOSIS — R1030 Lower abdominal pain, unspecified: Secondary | ICD-10-CM

## 2022-06-19 DIAGNOSIS — M6281 Muscle weakness (generalized): Secondary | ICD-10-CM

## 2022-06-19 DIAGNOSIS — R278 Other lack of coordination: Secondary | ICD-10-CM

## 2022-06-19 NOTE — Therapy (Signed)
OUTPATIENT PHYSICAL THERAPY TREATMENT NOTE   Patient Name: Karen Warner MRN: [id number] DOB:03-03-1951, 71 y.o., female Today's Date: 06/19/2022  PCP: Maggie Schwalbe, PA-C REFERRING PROVIDER: Willia Craze, NP  END OF SESSION:   PT End of Session - 06/19/22 1400     Visit Number 5    Date for PT Re-Evaluation 07/31/22    Authorization Type Medicare    Authorization - Visit Number 5    Authorization - Number of Visits 10    PT Start Time 1400    PT Stop Time 1445    PT Time Calculation (min) 45 min    Activity Tolerance Patient tolerated treatment well    Behavior During Therapy [facility] for tasks assessed/performed             Past Medical History:  Diagnosis Date   Anemia 12/12/2019   Arthralgia 01/14/2020   Arthritis    Asthma    Atherosclerotic heart disease of native coronary artery without angina pectoris 04/13/2014   CHF (congestive heart failure) (HCC)    Chronic bilateral low back pain with bilateral sciatica 01/13/2019   Chronic bladder pain 11/08/2014   Last Assessment & Plan:  Formatting of this note might be different from the original. Patient has chronic pain syndrome in general I think this is unfortunately worsening her pelvic pain and bladder pain her examination today was very reassuring I am advised her to use the estrogen cream I did start her on Elavil 10 milligrams at that time if she does tolerated will increase it to 25 milligrams.    Chronic headaches    Chronic idiopathic constipation 12/22/2014   Formatting of this note might be different from the original. Last Assessment & Plan:  For better bowel emptying please use either Citrucel / Benefiber start with 2 tablespoons daily and titrate up or down to effect.  Also please use glycerin suppositories as needed to assist in evacuation. Last Assessment & Plan:  Formatting of this note might be different from the original. For better bowel empt   Chronic obstructive pulmonary disease,  unspecified ([place]) 03/18/2018   Class 1 obesity due to excess calories with serious comorbidity and body mass index (BMI) of 31.0 to 31.9 in adult 06/26/2020   Colon polyps    COPD (chronic obstructive pulmonary disease) (HCC)    Coronary artery disease    DDD (degenerative disc disease), cervical 01/13/2019   Depression 11/05/2019   Diabetic polyneuropathy associated with type 2 diabetes mellitus ([place]) 02/24/2019   Diverticulitis 05/20/2018   Diverticulitis of colon 03/18/2018   Family history of ischemic heart disease (IHD) 08/16/2013   Fibromyalgia affecting multiple sites 01/14/2020   Gastro-esophageal reflux disease without esophagitis 01/13/2019   Glaucoma 11/08/2014   Hordeolum externum of left upper eyelid 03/13/2020   Hypertension 11/08/2014   IBS (irritable bowel syndrome)    Iron deficiency anemia 01/13/2019   Left renal mass 11/08/2019   Leukocytosis 05/28/2018   Low vitamin B12 level 03/13/2020   Low vitamin D level 12/12/2019   LV dysfunction 05/31/2019   Malignant essential hypertension 01/22/2016   Migraine, unspecified, not intractable, without status migrainosus 11/08/2014   Mixed hyperlipidemia 01/13/2019   Myocardial infarction ([place])    Other premature beats 11/08/2014   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 Last Assessment & Plan:  For vaginal atrophy please place a pea size dab of Estrogen vaginal cream  into the vagina 3 times a week ( Monday, Wednesday, Friday) Last Assessment & Plan:  Formatting of this note might be different from the original. For vaginal atrophy please place a pea size dab  of Estrogen vaginal cream  into the vagina    Past Surgical History:  Procedure Laterality Date   ABDOMINAL HYSTERECTOMY     BIV ICD INSERTION CRT-D N/A 12/31/2020   Procedure: BIV ICD INSERTION CRT-D;  Surgeon: Taylor, Gregg W, MD;  Location: [facility] IN[facility];  Service: Cardiovascular;  Laterality: N/A;   BLEPHAROPLASTY Bilateral    COLON SURGERY     CORONARY ANGIOPLASTY WITH STENT PLACEMENT  2020   X3   CORONARY STENT INTERVENTION N/A 08/03/2020   Procedure: CORONARY STENT INTERVENTION;  Surgeon: Cooper, Michael, MD;  Location: [facility];  Service: Cardiovascular;  Laterality: N/A;   INTRAVASCULAR PRESSURE WIRE/FFR STUDY N/A 08/03/2020   Procedure: INTRAVASCULAR PRESSURE WIRE/FFR STUDY;  Surgeon: Cooper, Michael, MD;  Location: [facility];  Service: Cardiovascular;  Laterality: N/A;   LEFT HEART CATH AND CORONARY ANGIOGRAPHY N/A 08/03/2020   Procedure: LEFT HEART CATH AND CORONARY ANGIOGRAPHY;  Surgeon: Cooper, Michael, MD;  Location: [facility];  Service: Cardiovascular;  Laterality: N/A;   REFRACTIVE SURGERY Left    [place]   Patient Active Problem List   Diagnosis Date Noted   Biventricular ICD (implantable cardioverter-defibrillator) in place 04/03/2021   Obstructive sleep apnea 03/15/2021   COPD (chronic obstructive pulmonary disease) (HCC)    Colon polyps    Chronic headaches    CHF (congestive heart failure) (HCC)    Asthma    Arthritis    Anemia in chronic kidney disease 02/19/2021   IBS (irritable bowel syndrome)    Syncope and collapse 12/26/2020   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   Orthostatic hypotension 11/21/2020   Daytime sleepiness 11/20/2020   Hypertensive heart disease with heart failure (HCC) 11/17/2020   Syncope 11/17/2020   Herpes simplex vulvovaginitis 11/02/2020   Snoring 10/17/2020   Depressed left ventricular ejection fraction 10/16/2020   Left bundle branch block 10/16/2020   Nasal congestion 09/30/2020   Coronary artery  disease    Myocardial infarction (HCC)    Unstable angina (HCC) 08/03/2020   Ischemic cardiomyopathy 08/03/2020   Non-ST elevation (NSTEMI) myocardial infarction (HCC) 08/03/2020   Obesity (BMI 30-39.9) 06/26/2020   Pustular rash 05/14/2020   Torticollis, acute 04/11/2020   Hordeolum externum of left upper eyelid 03/13/2020   Low vitamin B12 level 03/13/2020   Pain of right great toe 01/24/2020   Arthralgia 01/14/2020   Fibromyalgia affecting multiple sites 01/14/2020   Diarrhea of presumed infectious origin 01/07/2020   Ingrown nail 12/27/2019   History of anemia 12/12/2019   Low hemoglobin 12/12/2019   Low vitamin D level 12/12/2019   Anemia 12/12/2019   Long-term use of aspirin therapy 11/15/2019   Left renal mass 11/08/2019   Depression 11/05/2019   Neuropathy 11/05/2019   Other fatigue 09/12/2019   Rhinosinusitis 09/12/2019   Hypertensive emergency 08/06/2019   Left ventricular dysfunction 05/31/2019   LV dysfunction 05/31/2019   Trochanteric bursitis of right hip 04/20/2019   Aftercare following surgery 04/12/2019   Injury of toenail of left foot 04/06/2019     Moderate episode of recurrent major depressive disorder ([place]) 03/29/2019   Diabetic polyneuropathy associated with type 2 diabetes mellitus ([place]) 02/24/2019   History of disseminated herpes simplex 02/22/2019   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 ([place]) 42/68/3419   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 ([place]) 02/22/2019   Seasonal allergies 02/22/2019   Encounter to establish care 01/23/2019   Vitamin D deficiency 01/23/2019   Chronic bilateral low back pain with bilateral sciatica 01/13/2019   DDD (degenerative disc disease), cervical 01/13/2019   Gastro-esophageal reflux disease without esophagitis 01/13/2019   History of intestinal surgery 01/13/2019   Iron deficiency anemia 01/13/2019   Mixed hyperlipidemia 01/13/2019   Tobacco abuse 01/13/2019   Leukocytosis 05/28/2018   Diverticulitis 05/20/2018    Chronic obstructive pulmonary disease, unspecified ([place]) 03/18/2018   Diverticulitis of colon 03/18/2018   PVD (peripheral vascular disease) ([place]) 04/08/2017   Chest pain 04/07/2017   Malignant essential hypertension 01/22/2016   Lower abdominal pain 12/22/2014   Chronic idiopathic constipation 12/22/2014   Chronic bladder pain 11/08/2014   Glaucoma 11/08/2014   H/O vaginal hysterectomy 11/08/2014   Hypertension 11/08/2014   Migraine, unspecified, not intractable, without status migrainosus 11/08/2014   Nocturia 11/08/2014   Other premature beats 11/08/2014   Urge incontinence 11/08/2014   Urinary urgency 11/08/2014   Vaginal atrophy 11/08/2014   Atherosclerotic heart disease of native coronary artery without angina pectoris 04/13/2014   Type 2 diabetes mellitus with stage 3b chronic kidney disease, with long-term current use of insulin ([place]) 12/08/2013   Family history of ischemic heart disease (IHD) 08/16/2013   Ptosis 04/19/2012   REFERRING DIAG: R15.9 (ICD-10-CM) - Incontinence of feces, unspecified fecal incontinence type   THERAPY DIAG:  Muscle weakness (generalized)   Lower abdominal pain   Other lack of coordination   Rationale for Evaluation and Treatment Rehabilitation   PERTINENT HISTORY: CHF, Chronic obstructive pulmonary disease; Type 2 diabetes, Fibromyalgia, Glaucoma, Myocardial Infarction, PVD, Abdominal Hysterectomy, Pace maker   PRECAUTIONS: Pace maker   SUBJECTIVE: My sugar is up. I had gas last night. When I take Linzess then I have to rush to the bathroom. When I do not take the Linzess I do not have as good of a bowel movement. PT is helping my stomach feel better. No urinary leakage for awhile.                  PAIN:  Are you having pain? Yes NPRS scale: 4-8/10 Pain location:  lower abdominal pain and upper is more bloating , around the surgical site   Pain type: like pelvis is opening up, pressure, achiness Pain description: constant     Aggravating factors: comes on randomly Relieving factors: take medication   BOWEL MOVEMENT Pain with bowel movement: No Type of bowel movement:Type ([place] Stool Scale) Type 2, 3, 4 and Frequency 2-3 times per day Fully empty rectum: No sometimes Leakage: Yes: when she passes gas, Pads: Yes: 2 pads per day Fiber supplement: Yes: Miralax   URINATION Pain with urination: No Fully empty bladder: Yes:   Stream: Strong Urgency: Yes:   Frequency: normal Leakage: Urge to void on occasion when she waits too long Pads: Yes: for fecal and urinary leakage     PATIENT GOALS figure out what keeps stomach sore , not leak stool with gas   OBJECTIVE: (objective measures completed at initial evaluation unless otherwise dated)   COGNITION:            Overall cognitive status: Within functional limits for  tasks assessed                POSTURE:  Patient stands with big abdomen, reduced lumbar lordosis, forward head and rounded shoulders   LUMBARAROM/PROM   A/PROM A/PROM  eval A/ROM 06/05/2022  Flexion full full  Extension Decreased by 50% Decreased by 25%  Right lateral flexion Decreased by 25% Decreased by 25%  Left lateral flexion Decreased by 25% Decreased by 25%  Right rotation Decreased by 50% Decreased by 25%  Left rotation Decreased by 50% Decreased by 25%   (Blank rows = not tested)   LOWER EXTREMITY ROM:   Passive ROM Right eval Left eval  Hip flexion 105 85  Hip external rotation 40 50   (Blank rows = not tested)   LOWER EXTREMITY MMT:          MMT Right eval Left eval  Hip extension 3+/5 3+/5  Hip abduction 2/5 2/5  Hip adduction 4/5 4/5  Hip internal rotation 4/5 4/5  Hip external rotation 4/5 4/5    PELVIC MMT: will be done at next visit. Therapist explained to patient what the exam of the pelvis entails to be prepared for the next .    MMT eval  Vaginal    Internal Anal Sphincter    External Anal Sphincter    Puborectalis    Diastasis Recti     (Blank rows = not tested)         PALPATION:   General  scar suprapubically is tight and sensitive, tender in the lower abdominal. Superior midline in abdomen, minimal abdominal contraction                 External Perineal Exam will be done at next visit                             Internal Pelvic Floor will be done at next visit   T TODAY'S TREATMENT  06/19/2022 Manual: Soft tissue mobilization:Circular massage to abdomen to promote peristalic motion. Manual work to the diaphragm. Scar tissue mobilization: Myofascial release:scial release of the mesenteric root, release of the pubovesical ligament, release around the umbilicus, release of the upper lateral abdominal with one hand anterior and other posterior., release by the xiphoid process Neuromuscular re-education: Core retraining: Core facilitation: Form correction: Pelvic floor contraction training: Down training: Exercises: Stretches/mobility: Strengthening:Bridges 20x Hookly ball squeeze with tactile cues to flatten her back onto the mat 15x Hookly hip abduction with pelvic floor contraction with red band 15 x Therapeutic activities: Functional strengthening activities: Self-care:       06/05/2022 Manual: Soft tissue mobilization:Circular massage to abdomen to promote peristalic motion. Manual work to the diaphragm.  Scar tissue mobilization:scar massage to the lower abdomen to improve mobility.  Myofascial release:fascial release of the mesenteric root, release of the pubovesical ligament, release around the umbilicus, release of the upper lateral abdominal with one hand anterior and other posterior., release by the xiphoid process    05/29/2022 Neuromuscular re-education: Core facilitation: diaphragmatic breathing with lower abdominal contraction 15x                                    Resisted hip flexion 5 times on right and left 3 times and stopped due to left lower abdominal pain     05/13/2022 Manual: Soft  tissue mobilization: Circular massage to abdomen to  promote peristalic motion. Manual work to the diaphragm.  Scar tissue mobilization:scar massage to the lower abdomen to improve mobility.  Myofascial release: fascial release of the mesenteric root, release of the pubovesical ligament, release around the umbilicus, release of the upper lateral abdominal with one hand anterior and other posterior., release by the xiphoid process Self-care: Educated patient on abdominal massage      PATIENT EDUCATION: Education details:  Access Code: [email or web address] Person educated: Patient Education method: Explanation, Demonstration, Corporate treasurer cues, Verbal cues, and Handouts Education comprehension: verbalized understanding, returned demonstration, verbal cues required, tactile cues required, and needs further education       HOME EXERCISE PROGRAM: 05/29/2022  Access Code: [email or web address] URL: [email or web address] Date: 05/29/2022 Prepared by: Earlie Counts   Exercises - Supine Diaphragmatic Breathing  - 3 x daily - 7 x weekly - 1 sets - 10 reps   ASSESSMENT:   CLINICAL IMPRESSION: Patient is a 71 y.o. female  who was seen today for physical therapy  treatment for fecal incontinence. Patient has not had urinary  leakage since last visit. She has better bowel movements with Linzess. She will leak stool when she is walking to the bathroom after taking Linzess. Abdominal pain is 60% better. Patient sugar levels were high and she had to take insulin shot during therapy so she was monitored for fatique. Patient will benefit from skilled therapy to reduce her abdominal pain and reduce fecal and urinary leakage.      OBJECTIVE IMPAIRMENTS decreased activity tolerance, decreased coordination, decreased endurance, decreased strength, increased fascial restrictions, increased muscle spasms, and pain.    ACTIVITY LIMITATIONS  pass gas will leak stool , continence, toileting   PARTICIPATION LIMITATIONS:  when she  has the urge   PERSONAL FACTORS CHF, Chronic obstructive pulmonary disease; Type 2 diabetes, Fibromyalgia, Glaucoma, Myocardial Infarction, PVD, Abdominal Hysterectomy, Pace maker are also affecting patient's functional outcome.    REHAB POTENTIAL: Good   CLINICAL DECISION MAKING: Stable/uncomplicated   EVALUATION COMPLEXITY: Low     GOALS: Goals reviewed with patient? Yes   SHORT TERM GOALS: Target date: 06/05/2022   Patient understands how to perform abdominal massage to reduce pain and bloating.  Baseline: Goal status: met 05/29/2022  2.  Patient pelvic floor is assessed to determine her strength.  Baseline:  Goal status: deferred 06/19/2022     LONG TERM GOALS: Target date: 07/31/2022    Patient independent with HEP to include hip stretches, abdominal contraction, pelvic floor strength.  Baseline:  Goal status: INITIAL   2.  Patient reports her abdominal pain is </= 2/10 due to improved scar mobility and reduction of trigger points in the abdomen.  Baseline:  Abdominal pain is 60% better Goal status: ongoing 06/19/2022  3.  Bilateral hip flexion >/= 105 so she is able to sit on the toilet to have a bowel movement and fully empty her rectum.  Baseline:  Goal status: INITIAL   4.  Patient pelvic floor strength >/= 3/5 so her fecal leakage with gas reduced >/= 70%.  Baseline:  Goal status: INITIAL   5.  Patient pelvic floor strength >/= 3/5 so her urinary leakage is reduced >/= 70%.  Baseline: no leakage but has not let therapist assess strength.  Goal status: Met 06/19/2022     PLAN: PT FREQUENCY: 1x/week   PT DURATION: 12 weeks   PLANNED INTERVENTIONS: Therapeutic exercises, Therapeutic activity, Neuromuscular re-education, Patient/Family education, Joint mobilization, Dry Needling, Spinal mobilization, Cryotherapy, Moist heat, scar mobilization, Taping, Biofeedback, and Manual therapy  PLAN FOR NEXT SESSION: abdominal massage and educate patient on how to perform  with scar massage; contraction of the abdominals, measure hip flexion on the right.     Earlie Counts, PT 06/19/22 2:52 PM

## 2022-06-20 ENCOUNTER — Encounter: Payer: Self-pay | Admitting: Cardiology

## 2022-06-20 ENCOUNTER — Ambulatory Visit (INDEPENDENT_AMBULATORY_CARE_PROVIDER_SITE_OTHER): Payer: Medicare Other | Admitting: Cardiology

## 2022-06-20 VITALS — BP 116/74 | HR 80 | Ht 65.0 in | Wt 196.4 lb

## 2022-06-20 DIAGNOSIS — I2510 Atherosclerotic heart disease of native coronary artery without angina pectoris: Secondary | ICD-10-CM | POA: Diagnosis not present

## 2022-06-20 DIAGNOSIS — I519 Heart disease, unspecified: Secondary | ICD-10-CM

## 2022-06-20 DIAGNOSIS — I739 Peripheral vascular disease, unspecified: Secondary | ICD-10-CM

## 2022-06-20 DIAGNOSIS — E1122 Type 2 diabetes mellitus with diabetic chronic kidney disease: Secondary | ICD-10-CM

## 2022-06-20 DIAGNOSIS — Z794 Long term (current) use of insulin: Secondary | ICD-10-CM

## 2022-06-20 DIAGNOSIS — Z95810 Presence of automatic (implantable) cardiac defibrillator: Secondary | ICD-10-CM

## 2022-06-20 DIAGNOSIS — N1832 Chronic kidney disease, stage 3b: Secondary | ICD-10-CM

## 2022-06-20 NOTE — Progress Notes (Unsigned)
Cardiology Office Note:    Date:  06/23/2022   ID:  Karen Warner, DOB 1951-04-22, MRN [id number]  PCP:  Maggie Schwalbe, PA-C  Cardiologist:  Berniece Salines, DO  Electrophysiologist:  None   Referring MD: Maggie Schwalbe, PA-C   " I am ok"   History of Present Illness:    Karen Warner is a 71 y.o. female with a hx of  coronary artery disease status post PCI, ischemic cardiomyopathy status post BiV pacemaker during her hospitalization in January 2022 presented for syncope episode, heart failure reduced ejection fraction recent EF 20 to 25% on her echo which was done in January 2022, sleep apnea is here today for follow-up visit.   I saw the patient most recently on January 16, 2021 at that time she had had a fall and was concerned about information on her device.  We reviewed her device check which was normal.  She did have some bilateral leg edema but was still at her dry weight.  Therefore her diuretics was not changed.   At her visit on January 16, 2021 we continued her torsemide which was every other day.  In terms of her ischemic cardiomyopathy we kept the Entresto, Coreg with no changes.  She was off Aldactone but her blood pressure did not tolerate starting this medication.    On March 15, 2021 she appeared to be doing well from a cardiovascular standpoint.  She was still waiting for her CPAP titration study.   I saw the patient on July 16, 2021 at that time she was post hospitalization from [facility] for heart failure exacerbation.  During that visit I started the patient on Verquvo 2.5mg  daily with the intention to titrate up to the maximum tolerable dose.  Continue her other guideline medical therapy.   I saw the patient September 17, 2021 At that time she has some increasing edema I increased her torsemide.    I saw the patient on January 21, 2022 at that time she was posthospitalization at [facility] where she underwent a left  heart catheterization with no indication for PCI.   I saw the patient on 02/19/2022 at that time she post hospitalization from [facility]. She was doing well. I did not change any medications.   Since I saw the patient she was hospitalized at [facility]. Records reviewed. She has also seen the neurologist for her migraine headaches.  Today no chest pain. She had questions about the medication from the neurologist as it relates to her heart health. No other complaints at this time.  Past Medical History:  Diagnosis Date   Anemia 12/12/2019   Arthralgia 01/14/2020   Arthritis    Asthma    Atherosclerotic heart disease of native coronary artery without angina pectoris 04/13/2014   CHF (congestive heart failure) (HCC)    Chronic bilateral low back pain with bilateral sciatica 01/13/2019   Chronic bladder pain 11/08/2014   Last Assessment & Plan:  Formatting of this note might be different from the original. Patient has chronic pain syndrome in general I think this is unfortunately worsening her pelvic pain and bladder pain her examination today was very reassuring I am advised her to use the estrogen cream I did start her on Elavil 10 milligrams at that time if she does tolerated will increase it to 25 milligrams.    Chronic headaches    Chronic idiopathic constipation 12/22/2014   Formatting of this note might be different  from the original. Last Assessment & Plan:  For better bowel emptying please use either Citrucel / Benefiber start with 2 tablespoons daily and titrate up or down to effect.  Also please use glycerin suppositories as needed to assist in evacuation. Last Assessment & Plan:  Formatting of this note might be different from the original. For better bowel empt   Chronic obstructive pulmonary disease, unspecified (HCC) 03/18/2018   Class 1 obesity due to excess calories with serious comorbidity and body mass index (BMI) of 31.0 to 31.9 in adult 06/26/2020   Colon polyps     COPD (chronic obstructive pulmonary disease) (HCC)    Coronary artery disease    DDD (degenerative disc disease), cervical 01/13/2019   Depression 11/05/2019   Diabetic polyneuropathy associated with type 2 diabetes mellitus (HCC) 02/24/2019   Diverticulitis 05/20/2018   Diverticulitis of colon 03/18/2018   Family history of ischemic heart disease (IHD) 08/16/2013   Fibromyalgia affecting multiple sites 01/14/2020   Gastro-esophageal reflux disease without esophagitis 01/13/2019   Glaucoma 11/08/2014   Hordeolum externum of left upper eyelid 03/13/2020   Hypertension 11/08/2014   IBS (irritable bowel syndrome)    Iron deficiency anemia 01/13/2019   Left renal mass 11/08/2019   Leukocytosis 05/28/2018   Low vitamin B12 level 03/13/2020   Low vitamin D level 12/12/2019   LV dysfunction 05/31/2019   Malignant essential hypertension 01/22/2016   Migraine, unspecified, not intractable, without status migrainosus 11/08/2014   Mixed hyperlipidemia 01/13/2019   Myocardial infarction (HCC)    Other premature beats 11/08/2014   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 Last Assessment & Plan:  For vaginal atrophy please place a pea size dab of Estrogen vaginal cream  into the vagina 3 times a week ( Monday, Wednesday, Friday) Last Assessment & Plan:  Formatting of this note might be different from the original. For vaginal atrophy please place a pea size dab of Estrogen vaginal cream  into the vagina     Past Surgical History:  Procedure Laterality Date   ABDOMINAL HYSTERECTOMY     BIV ICD INSERTION CRT-D N/A 12/31/2020    Procedure: BIV ICD INSERTION CRT-D;  Surgeon: Marinus Maw, MD;  Location: [facility] INVASIVE CV LAB;  Service: Cardiovascular;  Laterality: N/A;   BLEPHAROPLASTY Bilateral    COLON SURGERY     CORONARY ANGIOPLASTY WITH STENT PLACEMENT  2020   X3   CORONARY STENT INTERVENTION N/A 08/03/2020   Procedure: CORONARY STENT INTERVENTION;  Surgeon: Tonny Bollman, MD;  Location: [facility] INVASIVE CV LAB;  Service: Cardiovascular;  Laterality: N/A;   INTRAVASCULAR PRESSURE WIRE/FFR STUDY N/A 08/03/2020   Procedure: INTRAVASCULAR PRESSURE WIRE/FFR STUDY;  Surgeon: Tonny Bollman, MD;  Location: [facility] INVASIVE CV LAB;  Service: Cardiovascular;  Laterality: N/A;   LEFT HEART CATH AND CORONARY ANGIOGRAPHY N/A 08/03/2020   Procedure: LEFT HEART CATH AND CORONARY ANGIOGRAPHY;  Surgeon: Tonny Bollman, MD;  Location: [facility] INVASIVE CV LAB;  Service: Cardiovascular;  Laterality: N/A;   REFRACTIVE SURGERY Left    [place]    Current Medications: Current Meds  Medication Sig   acetaminophen (TYLENOL) 325 MG tablet Take 2 tablets by  mouth every 6 (six) hours as needed for pain.   ACETAMINOPHEN-BUTALBITAL 50-325 MG TABS Take 1 tablet by mouth every 4 (four) hours as needed for severe pain.   albuterol (PROVENTIL) (2.5 MG/3ML) 0.083% nebulizer solution INL CONTENTS OF 1 VIAL VIA NEBULIZER Q 4 H PRF WHEEZING OR COUGH   albuterol (VENTOLIN HFA) 108 (90 Base) MCG/ACT inhaler Inhale 2 puffs into the lungs every 6 (six) hours as needed for shortness of breath or wheezing.   aspirin 81 MG EC tablet 81 mg daily.   baclofen (LIORESAL) 10 MG tablet Take 10 mg by mouth 3 (three) times daily.   carvedilol (COREG) 3.125 MG tablet TAKE 1 TABLET(3.125 MG) BY MOUTH TWICE DAILY WITH A MEAL   clopidogrel (PLAVIX) 75 MG tablet TAKE 1 TABLET(75 MG) BY MOUTH DAILY   diclofenac Sodium (VOLTAREN) 1 % GEL Apply 2 g topically 2 (two) times daily as needed (pain).   dicyclomine (BENTYL) 10 MG capsule TAKE 1 CAPSULE(10 MG) BY MOUTH IN THE MORNING AND  AT BEDTIME   famotidine (PEPCID) 40 MG tablet TAKE 1 TABLET(40 MG) BY MOUTH TWICE DAILY   FARXIGA 10 MG TABS tablet TAKE 1 TABLET(10 MG) BY MOUTH DAILY BEFORE BREAKFAST   ferrous sulfate 324 MG TBEC Take 324 mg by mouth daily with breakfast.   fluticasone (FLONASE) 50 MCG/ACT nasal spray Place 1 spray into both nostrils as needed for allergies or rhinitis.   folic acid (FOLVITE) 1 MG tablet Take 1 mg by mouth daily.   glycerin adult 2 g suppository Place 1 suppository rectally as needed for constipation. Apply after bowel movements.   insulin lispro (HUMALOG) 100 UNIT/ML KwikPen Inject 16 Units into the skin 3 (three) times daily. Administer 16 units three times daily before a meal per sliding scale   lidocaine (LIDODERM) 5 % Place 1 patch onto the skin daily. Remove & Discard patch within 12 hours or as directed by MD   LINZESS 72 MCG capsule Take 72 mcg by mouth every other day.   loratadine (CLARITIN) 10 MG tablet Take 10 mg by mouth daily.   nitroGLYCERIN (NITROSTAT) 0.4 MG SL tablet Place 1 tablet (0.4 mg total) under the tongue every 5 (five) minutes as needed for chest pain.   nystatin (MYCOSTATIN) 100000 UNIT/ML suspension Take 5 mLs by mouth as needed.   pantoprazole (PROTONIX) 40 MG tablet TAKE 1 TABLET(40 MG) BY MOUTH DAILY   pregabalin (LYRICA) 50 MG capsule Take 50 mg by mouth 3 (three) times daily.   ranolazine (RANEXA) 500 MG 12 hr tablet Take 1 tablet (500 mg total) by mouth 2 (two) times daily.   rosuvastatin (CRESTOR) 40 MG tablet Take 40 mg by mouth daily.   sacubitril-valsartan (ENTRESTO) 24-26 MG TAKE 1 TABLET BY MOUTH TWICE DAILY   torsemide (DEMADEX) 20 MG tablet Take twice on Tuesday and Thursday take once on other days   TRESIBA FLEXTOUCH 200 UNIT/ML FlexTouch Pen Inject 40 Units into the skin 2 (two) times daily. 40 units in the morning and up to 45 units at night   TRINTELLIX 5 MG TABS tablet Take 5 mg by mouth at bedtime.   valACYclovir (VALTREX) 1000 MG tablet Take  1,000 mg by mouth 2 (two) times daily as needed (flares).   Vericiguat (VERQUVO) 5 MG TABS Take 1 tablet (5 mg total) by mouth daily.   vitamin B-12 (CYANOCOBALAMIN) 1000 MCG tablet Take 1,000 mcg by mouth daily.   Vitamin D, Ergocalciferol, (DRISDOL) 1.25 MG (50000 UNIT) CAPS capsule Take  50,000 Units by mouth once a week. sundays     Allergies:   Bee venom, Sumatriptan, Tramadol, Amoxicillin-pot clavulanate, Oxycodone, Buprenorphine hcl, Clarithromycin, Duloxetine hcl, Hydrocodone, Lactose, Liraglutide, and Tramadol hcl   Social History   Socioeconomic History   Marital status: Significant Other    Spouse name: Not on file   Number of children: 3   Years of education: Not on file   Highest education level: Not on file  Occupational History   Occupation: retired  Tobacco Use   Smoking status: Former    Types: Cigarettes    Quit date: 2021    Years since quitting: 2.5   Smokeless tobacco: Never  Vaping Use   Vaping Use: Never used  Substance and Sexual Activity   Alcohol use: Never   Drug use: Never   Sexual activity: Not on file  Other Topics Concern   Not on file  Social History Narrative   Not on file   Social Determinants of Health   Financial Resource Strain: Not on file  Food Insecurity: Not on file  Transportation Needs: Not on file  Physical Activity: Not on file  Stress: Not on file  Social Connections: Not on file     Family History: The patient's family history includes Colon polyps in her mother; Coronary artery disease in her mother; Diabetes in her daughter, maternal grandmother, and mother; Glaucoma in her mother; Heart disease in her father; Hypertension in her brother and mother; Lung cancer in her mother; Migraines in her father and son.  ROS:   Review of Systems  Constitution: Negative for decreased appetite, fever and weight gain.  HENT: Negative for congestion, ear discharge, hoarse voice and sore throat.   Eyes: Negative for discharge,  redness, vision loss in right eye and visual halos.  Cardiovascular: Negative for chest pain, dyspnea on exertion, leg swelling, orthopnea and palpitations.  Respiratory: Negative for cough, hemoptysis, shortness of breath and snoring.   Endocrine: Negative for heat intolerance and polyphagia.  Hematologic/Lymphatic: Negative for bleeding problem. Does not bruise/bleed easily.  Skin: Negative for flushing, nail changes, rash and suspicious lesions.  Musculoskeletal: Negative for arthritis, joint pain, muscle cramps, myalgias, neck pain and stiffness.  Gastrointestinal: Negative for abdominal pain, bowel incontinence, diarrhea and excessive appetite.  Genitourinary: Negative for decreased libido, genital sores and incomplete emptying.  Neurological: Negative for brief paralysis, focal weakness, headaches and loss of balance.  Psychiatric/Behavioral: Negative for altered mental status, depression and suicidal ideas.  Allergic/Immunologic: Negative for HIV exposure and persistent infections.    EKGs/Labs/Other Studies Reviewed:    The following studies were reviewed today:   EKG:  The ekg ordered today demonstrates atrial sensed and ventricular paced rhythm.  TTE 01/02/2022 SUMMARY  The left ventricle is severely dilated.  Left ventricular systolic function is severely reduced.  LV ejection fraction = 20-25%.  Left ventricular filling pattern is prolonged relaxation.  There is severe global hypokinesis of the left ventricle.  The right ventricle is normal in size and function.  There is no significant valvular stenosis or regurgitation.  The aortic sinus is normal size.  The IVC is normal in size with an inspiratory collapse of greater then  50%, suggesting normal right atrial pressure.  There is no pericardial effusion.  Compared to prior study dated 08/08/19, LV function has further  declined.     LHC 01/03/2022 At the conclusion of the procedure the catheter was removed with the aid   of the J-tip guidewire.  The sheath  was then removed and hemostasis was  achieved with a preludesync Evo band.  There were no evident  complications.   ANGIOGRAM/CORONARY ARTERIOGRAM:    The Coronary arteries showed diffuse mild disease with no target for PCI   LEFT VENTRICULOGRAM:  Left ventriculography was not done, EDP normal   Complications:  None   Conclusions:    Mildly abnormal Coronary arteries as described.   RECOMMENDATION:   Medical therapy for Coronary artery disease risk factor reduction and  Angina  Coronary Findings Diagnostic Dominance: Left  Left Main: The vessel was visualized by angiography, is moderate in size and is angiographically normal.  Left Anterior Descending: Ost LAD to Prox LAD lesion is 5% stenosed. Mid LAD-1 lesion is 15% stenosed. Mid LAD-2 lesion is 20% stenosed.  First Obtuse Marginal Branch: 1st Mrg lesion is 15% stenosed. Left Posterior Descending Artery: LPDA lesion is 20% stenosed.  Right Coronary Artery: The vessel was visualized by angiography, is moderate in size and is angiographically normal.   Intervention  No interventions have been documented. Specimen Collected: --      Recent Labs: 01/21/2022: Magnesium 2.3 02/13/2022: ALT 15; BUN 15; Creatinine 1.0; Hemoglobin 12.5; Platelets 276; Potassium 3.9; Sodium 142  Recent Lipid Panel No results found for: "CHOL", "TRIG", "HDL", "CHOLHDL", "VLDL", "LDLCALC", "LDLDIRECT"  Physical Exam:    VS:  BP 116/74   Pulse 80   Ht 5\' 5"  (1.651 m)   Wt 196 lb 6.4 oz (89.1 kg)   SpO2 97%   BMI 32.68 kg/m     Wt Readings from Last 3 Encounters:  06/20/22 196 lb 6.4 oz (89.1 kg)  05/15/22 193 lb (87.5 kg)  03/20/22 200 lb (90.7 kg)     GEN: Well nourished, well developed in no acute distress HEENT: Normal NECK: No JVD; No carotid bruits LYMPHATICS: No lymphadenopathy CARDIAC: S1S2 noted,RRR, no murmurs, rubs, gallops RESPIRATORY:  Clear to auscultation without rales, wheezing or  rhonchi  ABDOMEN: Soft, non-tender, non-distended, +bowel sounds, no guarding. EXTREMITIES: No edema, No cyanosis, no clubbing MUSCULOSKELETAL:  No deformity  SKIN: Warm and dry NEUROLOGIC:  Alert and oriented x 3, non-focal PSYCHIATRIC:  Normal affect, good insight  ASSESSMENT:    1. CAD in native artery   2. PVD (peripheral vascular disease) ([place])   3. Left ventricular dysfunction   4. Type 2 diabetes mellitus with stage 3b chronic kidney disease, with long-term current use of insulin ([place])   5. Biventricular ICD (implantable cardioverter-defibrillator) in place    PLAN:    She clinically appears to be stable from a cardiovascular standpoint. No medication changes today. However discussed with the patient the I would prefer her neurologist put her on other medications for her migraine headache other than an NSAID for daily use. I explained that use of the continuous NSAID can lead to heart failure exacerbation.   The patient is in agreement with the above plan. The patient left the office in stable condition.  The patient will follow up in 6 months or sooner if needed.    Medication Adjustments/Labs and Tests Ordered: Current medicines are reviewed at length with the patient today.  Concerns regarding medicines are outlined above.  Orders Placed This Encounter  Procedures   EKG 12-Lead   No orders of the defined types were placed in this encounter.   Patient Instructions  Medication Instructions:  Your physician recommends that you continue on your current medications as directed. Please refer to the Current Medication list given to you today.  *  If you need a refill on your cardiac medications before your next appointment, please call your pharmacy*   Lab Work: NONE If you have labs (blood work) drawn today and your tests are completely normal, you will receive your results only by: [place] (if you have MyChart) OR A paper copy in the mail If you have any lab test  that is abnormal or we need to change your treatment, we will call you to review the results.   Testing/Procedures: NONE   Follow-Up: At [facility], you and your health needs are our priority.  As part of our continuing mission to provide you with exceptional heart care, we have created designated Provider Care Teams.  These Care Teams include your primary Cardiologist (physician) and Advanced Practice Providers (APPs -  Physician Assistants and Nurse Practitioners) who all work together to provide you with the care you need, when you need it.  We recommend signing up for the patient portal called "MyChart".  Sign up information is provided on this After Visit Summary.  MyChart is used to connect with patients for Virtual Visits (Telemedicine).  Patients are able to view lab/test results, encounter notes, upcoming appointments, etc.  Non-urgent messages can be sent to your provider as well.   To learn more about what you can do with MyChart, go to [email or web address].    Your next appointment:   6 month(s)  The format for your next appointment:   In Person  Provider:   Berniece Salines, DO    Adopting a Healthy Lifestyle.  Know what a healthy weight is for you (roughly BMI <25) and aim to maintain this   Aim for 7+ servings of fruits and vegetables daily   65-80+ fluid ounces of water or unsweet tea for healthy kidneys   Limit to max 1 drink of alcohol per day; avoid smoking/tobacco   Limit animal fats in diet for cholesterol and heart health - choose grass fed whenever available   Avoid highly processed foods, and foods high in saturated/trans fats   Aim for low stress - take time to unwind and care for your mental health   Aim for 150 min of moderate intensity exercise weekly for heart health, and weights twice weekly for bone health   Aim for 7-9 hours of sleep daily   When it comes to diets, agreement about the perfect plan isnt easy to find, even among the  experts. Experts at the [employer] developed an idea known as the Healthy Eating Plate. Just imagine a plate divided into logical, healthy portions.   The emphasis is on diet quality:   Load up on vegetables and fruits - one-half of your plate: Aim for color and variety, and remember that potatoes dont count.   Go for whole grains - one-quarter of your plate: Whole wheat, barley, wheat berries, quinoa, oats, brown rice, and foods made with them. If you want pasta, go with whole wheat pasta.   Protein power - one-quarter of your plate: Fish, chicken, beans, and nuts are all healthy, versatile protein sources. Limit red meat.   The diet, however, does go beyond the plate, offering a few other suggestions.   Use healthy plant oils, such as olive, canola, soy, corn, sunflower and peanut. Check the labels, and avoid partially hydrogenated oil, which have unhealthy trans fats.   If youre thirsty, drink water. Coffee and tea are good in moderation, but skip sugary drinks and limit milk and dairy products to  one or two daily servings.   The type of carbohydrate in the diet is more important than the amount. Some sources of carbohydrates, such as vegetables, fruits, whole grains, and beans-are healthier than others.   Finally, stay active  Signed, Berniece Salines, DO  06/23/2022 8:59 PM    [facility]

## 2022-06-20 NOTE — Patient Instructions (Addendum)
Medication Instructions:  Your physician recommends that you continue on your current medications as directed. Please refer to the Current Medication list given to you today.  *If you need a refill on your cardiac medications before your next appointment, please call your pharmacy*   Lab Work: NONE If you have labs (blood work) drawn today and your tests are completely normal, you will receive your results only by: MyChart Message (if you have MyChart) OR A paper copy in the mail If you have any lab test that is abnormal or we need to change your treatment, we will call you to review the results.   Testing/Procedures: NONE   Follow-Up: At [facility], you and your health needs are our priority.  As part of our continuing mission to provide you with exceptional heart care, we have created designated Provider Care Teams.  These Care Teams include your primary Cardiologist (physician) and Advanced Practice Providers (APPs -  Physician Assistants and Nurse Practitioners) who all work together to provide you with the care you need, when you need it.  We recommend signing up for the patient portal called "MyChart".  Sign up information is provided on this After Visit Summary.  MyChart is used to connect with patients for Virtual Visits (Telemedicine).  Patients are able to view lab/test results, encounter notes, upcoming appointments, etc.  Non-urgent messages can be sent to your provider as well.   To learn more about what you can do with MyChart, go to [email or web address].    Your next appointment:   6 month(s)  The format for your next appointment:   In Person  Provider:   Kardie Tobb, DO   

## 2022-06-23 DIAGNOSIS — I2510 Atherosclerotic heart disease of native coronary artery without angina pectoris: Secondary | ICD-10-CM | POA: Insufficient documentation

## 2022-07-02 ENCOUNTER — Other Ambulatory Visit: Payer: Self-pay | Admitting: Cardiology

## 2022-07-03 ENCOUNTER — Encounter: Payer: Medicare Other | Attending: Nurse Practitioner | Admitting: Physical Therapy

## 2022-07-03 ENCOUNTER — Encounter: Payer: Self-pay | Admitting: Physical Therapy

## 2022-07-03 DIAGNOSIS — R278 Other lack of coordination: Secondary | ICD-10-CM | POA: Diagnosis present

## 2022-07-03 DIAGNOSIS — R1030 Lower abdominal pain, unspecified: Secondary | ICD-10-CM | POA: Diagnosis present

## 2022-07-03 DIAGNOSIS — M6281 Muscle weakness (generalized): Secondary | ICD-10-CM | POA: Insufficient documentation

## 2022-07-03 NOTE — Therapy (Addendum)
OUTPATIENT PHYSICAL THERAPY TREATMENT NOTE   Patient Name: Karen Warner MRN: [id number] DOB:07-19-1951, 71 y.o., female Today's Date: 07/03/2022  PCP: Maggie Schwalbe, PA-C REFERRING PROVIDER: Willia Craze, NP  END OF SESSION:   PT End of Session - 07/03/22 1319     Visit Number 6    Date for PT Re-Evaluation 07/31/22    Authorization Type Medicare    Authorization - Visit Number 6    Authorization - Number of Visits 10    PT Start Time 6629   came late   PT Stop Time 1357    PT Time Calculation (min) 38 min    Activity Tolerance Patient tolerated treatment well    Behavior During Therapy [facility] for tasks assessed/performed             Past Medical History:  Diagnosis Date   Anemia 12/12/2019   Arthralgia 01/14/2020   Arthritis    Asthma    Atherosclerotic heart disease of native coronary artery without angina pectoris 04/13/2014   CHF (congestive heart failure) (HCC)    Chronic bilateral low back pain with bilateral sciatica 01/13/2019   Chronic bladder pain 11/08/2014   Last Assessment & Plan:  Formatting of this note might be different from the original. Patient has chronic pain syndrome in general I think this is unfortunately worsening her pelvic pain and bladder pain her examination today was very reassuring I am advised her to use the estrogen cream I did start her on Elavil 10 milligrams at that time if she does tolerated will increase it to 25 milligrams.    Chronic headaches    Chronic idiopathic constipation 12/22/2014   Formatting of this note might be different from the original. Last Assessment & Plan:  For better bowel emptying please use either Citrucel / Benefiber start with 2 tablespoons daily and titrate up or down to effect.  Also please use glycerin suppositories as needed to assist in evacuation. Last Assessment & Plan:  Formatting of this note might be different from the original. For better bowel empt   Chronic obstructive pulmonary  disease, unspecified ([place]) 03/18/2018   Class 1 obesity due to excess calories with serious comorbidity and body mass index (BMI) of 31.0 to 31.9 in adult 06/26/2020   Colon polyps    COPD (chronic obstructive pulmonary disease) (HCC)    Coronary artery disease    DDD (degenerative disc disease), cervical 01/13/2019   Depression 11/05/2019   Diabetic polyneuropathy associated with type 2 diabetes mellitus ([place]) 02/24/2019   Diverticulitis 05/20/2018   Diverticulitis of colon 03/18/2018   Family history of ischemic heart disease (IHD) 08/16/2013   Fibromyalgia affecting multiple sites 01/14/2020   Gastro-esophageal reflux disease without esophagitis 01/13/2019   Glaucoma 11/08/2014   Hordeolum externum of left upper eyelid 03/13/2020   Hypertension 11/08/2014   IBS (irritable bowel syndrome)    Iron deficiency anemia 01/13/2019   Left renal mass 11/08/2019   Leukocytosis 05/28/2018   Low vitamin B12 level 03/13/2020   Low vitamin D level 12/12/2019   LV dysfunction 05/31/2019   Malignant essential hypertension 01/22/2016   Migraine, unspecified, not intractable, without status migrainosus 11/08/2014   Mixed hyperlipidemia 01/13/2019   Myocardial infarction ([place])    Other premature beats 11/08/2014   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 Last Assessment & Plan:  For vaginal atrophy please place a pea size dab of Estrogen vaginal cream  into the vagina 3 times a week ( Monday, Wednesday, Friday) Last Assessment & Plan:  Formatting of this note might be different from the original. For vaginal atrophy please place a pea  size dab of Estrogen vaginal cream  into the vagina    Past Surgical History:  Procedure Laterality Date   ABDOMINAL HYSTERECTOMY     BIV ICD INSERTION CRT-D N/A 12/31/2020   Procedure: BIV ICD INSERTION CRT-D;  Surgeon: Evans Lance, MD;  Location: [place] [facility];  Service: Cardiovascular;  Laterality: N/A;   BLEPHAROPLASTY Bilateral    COLON SURGERY     CORONARY ANGIOPLASTY WITH STENT PLACEMENT  2020   X3   CORONARY STENT INTERVENTION N/A 08/03/2020   Procedure: CORONARY STENT INTERVENTION;  Surgeon: Sherren Mocha, MD;  Location: [place] [facility];  Service: Cardiovascular;  Laterality: N/A;   INTRAVASCULAR PRESSURE WIRE/FFR STUDY N/A 08/03/2020   Procedure: INTRAVASCULAR PRESSURE WIRE/FFR STUDY;  Surgeon: Sherren Mocha, MD;  Location: [place] [facility];  Service: Cardiovascular;  Laterality: N/A;   LEFT HEART CATH AND CORONARY ANGIOGRAPHY N/A 08/03/2020   Procedure: LEFT HEART CATH AND CORONARY ANGIOGRAPHY;  Surgeon: Sherren Mocha, MD;  Location: [place] [facility];  Service: Cardiovascular;  Laterality: N/A;   REFRACTIVE SURGERY Left    [place]   Patient Active Problem List   Diagnosis Date Noted   CAD in native artery 06/23/2022   Biventricular ICD (implantable cardioverter-defibrillator) in place 04/03/2021   Obstructive sleep apnea 03/15/2021   COPD (chronic obstructive pulmonary disease) (HCC)    Colon polyps    Chronic headaches    CHF (congestive heart failure) (HCC)    Asthma    Arthritis    Anemia in chronic kidney disease 02/19/2021   IBS (irritable bowel syndrome)    Syncope and collapse 12/26/2020   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   Orthostatic hypotension 11/21/2020   Daytime sleepiness 11/20/2020   Hypertensive heart disease with heart failure ([place]) 11/17/2020   Syncope 11/17/2020   Herpes simplex vulvovaginitis 11/02/2020   Snoring 10/17/2020   Depressed left ventricular ejection fraction 10/16/2020   Left bundle branch block 10/16/2020   Nasal  congestion 09/30/2020   Coronary artery disease    Myocardial infarction [facility])    Unstable angina ([place]) 08/03/2020   Ischemic cardiomyopathy 08/03/2020   Non-ST elevation (NSTEMI) myocardial infarction ([place]) 08/03/2020   Obesity (BMI 30-39.9) 06/26/2020   Pustular rash 05/14/2020   Torticollis, acute 04/11/2020   Hordeolum externum of left upper eyelid 03/13/2020   Low vitamin B12 level 03/13/2020   Pain of right great toe 01/24/2020   Arthralgia 01/14/2020   Fibromyalgia affecting multiple sites 01/14/2020   Diarrhea of presumed infectious origin 01/07/2020   Ingrown nail 12/27/2019   History of anemia 12/12/2019   Low hemoglobin 12/12/2019   Low vitamin D level 12/12/2019   Anemia 12/12/2019   Long-term use of aspirin therapy 11/15/2019   Left renal mass 11/08/2019   Depression 11/05/2019   Neuropathy 11/05/2019   Other fatigue 09/12/2019   Rhinosinusitis 09/12/2019   Hypertensive emergency 08/06/2019   Left ventricular dysfunction 05/31/2019   LV dysfunction 05/31/2019   Trochanteric bursitis of right hip 04/20/2019   Aftercare following surgery  04/12/2019   Injury of toenail of left foot 04/06/2019   Moderate episode of recurrent major depressive disorder ([place]) 03/29/2019   Diabetic polyneuropathy associated with type 2 diabetes mellitus ([place]) 02/24/2019   History of disseminated herpes simplex 02/22/2019   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 ([place]) 16/09/9603   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 ([place]) 02/22/2019   Seasonal allergies 02/22/2019   Encounter to establish care 01/23/2019   Vitamin D deficiency 01/23/2019   Chronic bilateral low back pain with bilateral sciatica 01/13/2019   DDD (degenerative disc disease), cervical 01/13/2019   Gastro-esophageal reflux disease without esophagitis 01/13/2019   History of intestinal surgery 01/13/2019   Iron deficiency anemia 01/13/2019   Mixed hyperlipidemia 01/13/2019   Tobacco abuse 01/13/2019   Leukocytosis  05/28/2018   Diverticulitis 05/20/2018   Chronic obstructive pulmonary disease, unspecified ([place]) 03/18/2018   Diverticulitis of colon 03/18/2018   PVD (peripheral vascular disease) ([place]) 04/08/2017   Chest pain 04/07/2017   Malignant essential hypertension 01/22/2016   Lower abdominal pain 12/22/2014   Chronic idiopathic constipation 12/22/2014   Chronic bladder pain 11/08/2014   Glaucoma 11/08/2014   H/O vaginal hysterectomy 11/08/2014   Hypertension 11/08/2014   Migraine, unspecified, not intractable, without status migrainosus 11/08/2014   Nocturia 11/08/2014   Other premature beats 11/08/2014   Urge incontinence 11/08/2014   Urinary urgency 11/08/2014   Vaginal atrophy 11/08/2014   Atherosclerotic heart disease of native coronary artery without angina pectoris 04/13/2014   Type 2 diabetes mellitus with stage 3b chronic kidney disease, with long-term current use of insulin ([place]) 12/08/2013   Family history of ischemic heart disease (IHD) 08/16/2013   Ptosis 04/19/2012   REFERRING DIAG: R15.9 (ICD-10-CM) - Incontinence of feces, unspecified fecal incontinence type   THERAPY DIAG:  Muscle weakness (generalized)   Lower abdominal pain   Other lack of coordination   Rationale for Evaluation and Treatment Rehabilitation   PERTINENT HISTORY: CHF, Chronic obstructive pulmonary disease; Type 2 diabetes, Fibromyalgia, Glaucoma, Myocardial Infarction, PVD, Abdominal Hysterectomy, Pace maker   PRECAUTIONS: Pace maker   SUBJECTIVE: My sugar is up. I had gas last night. When I take Linzess then I have to rush to the bathroom. When I do not take the Linzess I do not have as good of a bowel movement. PT is helping my stomach feel better. No urinary leakage for awhile. My lower stomach is sore the day after I take the Linzess. I have not been doing my exercises do to not being home.                  PAIN:  Are you having pain? Yes NPRS scale: 4/10 Pain location:  lower abdominal pain  and upper is more bloating , around the surgical site   Pain type: like pelvis is opening up, pressure, achiness Pain description: constant    Aggravating factors: comes on randomly Relieving factors: take medication   BOWEL MOVEMENT Pain with bowel movement: No Type of bowel movement:Type ([place] Stool Scale) Type 2, 3, 4 and Frequency 2-3 times per day Fully empty rectum: No sometimes Leakage: Yes: when she passes gas after taking Linzess Pads: Yes: 2 pads per day Fiber supplement: Yes: Miralax   URINATION Pain with urination: No Fully empty bladder: Yes:   Stream: Strong Urgency: Yes:   Frequency: normal Leakage: Urge to void on occasion when she waits too long Pads: Yes: for fecal and urinary leakage     PATIENT GOALS figure out what keeps stomach sore ,  not leak stool with gas   OBJECTIVE: (objective measures completed at initial evaluation unless otherwise dated)   COGNITION:            Overall cognitive status: Within functional limits for tasks assessed                POSTURE:  Patient stands with big abdomen, reduced lumbar lordosis, forward head and rounded shoulders   LUMBARAROM/PROM   A/PROM A/PROM  eval A/ROM 06/05/2022  Flexion full full  Extension Decreased by 50% Decreased by 25%  Right lateral flexion Decreased by 25% Decreased by 25%  Left lateral flexion Decreased by 25% Decreased by 25%  Right rotation Decreased by 50% Decreased by 25%  Left rotation Decreased by 50% Decreased by 25%   (Blank rows = not tested)   LOWER EXTREMITY ROM:   Passive ROM Right eval Left eval Right  07/03/2022 Left  07/02/2022  Hip flexion 105 85 118 110  Hip external rotation 40 50 55 70   (Blank rows = not tested)   LOWER EXTREMITY MMT:          MMT Right eval Left eval  Hip extension 3+/5 3+/5  Hip abduction 2/5 2/5  Hip adduction 4/5 4/5  Hip internal rotation 4/5 4/5  Hip external rotation 4/5 4/5    PELVIC MMT: will be done at next visit. Therapist  explained to patient what the exam of the pelvis entails to be prepared for the next .    MMT eval  Vaginal    Internal Anal Sphincter    External Anal Sphincter    Puborectalis    Diastasis Recti    (Blank rows = not tested)         PALPATION:   General  scar suprapubically is tight and sensitive, tender in the lower abdominal. Superior midline in abdomen, minimal abdominal contraction                 External Perineal Exam will be done at next visit                             Internal Pelvic Floor will be done at next visit   T TODAY'S TREATMENT  07/02/2022 Neuromuscular re-education: Pelvic floor contraction training:supine anal contraction holding for 5 seconds 10x.  Exercises: Stretches/mobility stretched bilateral hamstring, hip rotators and piriformis with therapist assistance bilaterally.  Strengthening:Bridges 20x with ball squeeze, vc to contract the anus Hookly ball squeeze with tactile cues to flatten her back onto the mat 15x with VC to contract the anus Hookly hip abduction with pelvic floor contraction with red band 15 x    06/19/2022 Manual: Soft tissue mobilization:Circular massage to abdomen to promote peristalic motion. Manual work to the diaphragm. Myofascial release:scial release of the mesenteric root, release of the pubovesical ligament, release around the umbilicus, release of the upper lateral abdominal with one hand anterior and other posterior., release by the xiphoid process Exercises: Strengthening:Bridges 20x Hookly ball squeeze with tactile cues to flatten her back onto the mat 15x Hookly hip abduction with pelvic floor contraction with red band 15 x      06/05/2022 Manual: Soft tissue mobilization:Circular massage to abdomen to promote peristalic motion. Manual work to the diaphragm.  Scar tissue mobilization:scar massage to the lower abdomen to improve mobility.  Myofascial release:fascial release of the mesenteric root, release of the  pubovesical ligament, release around the umbilicus, release of the upper  lateral abdominal with one hand anterior and other posterior., release by the xiphoid process       PATIENT EDUCATION: Education details:  Access Code: [email or web address] Person educated: Patient Education method: Explanation, Demonstration, Tactile cues, Verbal cues, and Handouts Education comprehension: verbalized understanding, returned demonstration, verbal cues required, tactile cues required, and needs further education       HOME EXERCISE PROGRAM: 05/29/2022  Access Code: [email or web address] URL: [email or web address] Date: 05/29/2022 Prepared by: Earlie Counts   Exercises - Supine Diaphragmatic Breathing  - 3 x daily - 7 x weekly - 1 sets - 10 reps   ASSESSMENT:   CLINICAL IMPRESSION: Patient is a 71 y.o. female  who was seen today for physical therapy  treatment for fecal incontinence. Patient has not had urinary  leakage since last visit. She has better bowel movements with Linzess. She will leak stool with gas after taking Linzess but not when she does not take Linzess. She only has abdominal pain the day after taking Linzess. She has increased in bilateral hip hip ROM.  Patient sugar levels were high and she had to take insulin shot during therapy so she was monitored for fatique. Patient will benefit from skilled therapy to reduce her abdominal pain and reduce fecal and urinary leakage.      OBJECTIVE IMPAIRMENTS decreased activity tolerance, decreased coordination, decreased endurance, decreased strength, increased fascial restrictions, increased muscle spasms, and pain.    ACTIVITY LIMITATIONS  pass gas will leak stool , continence, toileting   PARTICIPATION LIMITATIONS:  when she has the urge   PERSONAL FACTORS CHF, Chronic obstructive pulmonary disease; Type 2 diabetes, Fibromyalgia, Glaucoma, Myocardial Infarction, PVD, Abdominal Hysterectomy, Pace maker are also affecting patient's functional outcome.     REHAB POTENTIAL: Good   CLINICAL DECISION MAKING: Stable/uncomplicated   EVALUATION COMPLEXITY: Low     GOALS: Goals reviewed with patient? Yes   SHORT TERM GOALS: Target date: 06/05/2022   Patient understands how to perform abdominal massage to reduce pain and bloating.  Baseline: Goal status: met 05/29/2022  2.  Patient pelvic floor is assessed to determine her strength.  Baseline:  Goal status: deferred 06/19/2022     LONG TERM GOALS: Target date: 07/31/2022    Patient independent with HEP to include hip stretches, abdominal contraction, pelvic floor strength.  Baseline:  Goal status: INITIAL   2.  Patient reports her abdominal pain is </= 2/10 due to improved scar mobility and reduction of trigger points in the abdomen.  Baseline:  Abdominal pain is 60% better, pain level 4/10 the day after taking Linzess Goal status: ongoing 07/03/2022 3.  Bilateral hip flexion >/= 105 so she is able to sit on the toilet to have a bowel movement and fully empty her rectum.  Baseline:   Goal status: Met 07/03/2022   4.  Patient pelvic floor strength >/= 3/5 so her fecal leakage with gas reduced >/= 70%.  Baseline: only after she takes the Linzess, if not taking the Linzess no stool leakage. Goal status: ongoing 07/03/2022   5.  Patient pelvic floor strength >/= 3/5 so her urinary leakage is reduced >/= 70%.  Baseline: no leakage but has not let therapist assess strength.  Goal status: Met 06/19/2022     PLAN: PT FREQUENCY: 1x/week   PT DURATION: 12 weeks   PLANNED INTERVENTIONS: Therapeutic exercises, Therapeutic activity, Neuromuscular re-education, Patient/Family education, Joint mobilization, Dry Needling, Spinal mobilization, Cryotherapy, Moist heat, scar mobilization, Taping, Biofeedback, and Manual therapy   PLAN FOR NEXT SESSION: if  doing well then discharge.  Earlie Counts, PT 07/03/22 1:59 PM  PHYSICAL THERAPY DISCHARGE SUMMARY  Visits from Start of Care: 6  Current  functional level related to goals / functional outcomes: See above. Therapist talked to patient when she no showed for her last visit and she was in the hospital. Patient has not returned since then.    Remaining deficits: See above.    Education / Equipment: HEP   Patient agrees to discharge. Patient goals were partially met. Patient is being discharged due to not returning since the last visit. Thank you for the referral. Earlie Counts, PT 07/30/22 10:43 AM

## 2022-07-04 ENCOUNTER — Encounter: Payer: Self-pay | Admitting: Student

## 2022-07-04 ENCOUNTER — Ambulatory Visit (INDEPENDENT_AMBULATORY_CARE_PROVIDER_SITE_OTHER): Payer: Medicare Other | Admitting: Student

## 2022-07-04 VITALS — BP 116/72 | HR 87 | Ht 65.0 in | Wt 196.0 lb

## 2022-07-04 DIAGNOSIS — I2510 Atherosclerotic heart disease of native coronary artery without angina pectoris: Secondary | ICD-10-CM | POA: Diagnosis not present

## 2022-07-04 DIAGNOSIS — I255 Ischemic cardiomyopathy: Secondary | ICD-10-CM

## 2022-07-04 DIAGNOSIS — Z95810 Presence of automatic (implantable) cardiac defibrillator: Secondary | ICD-10-CM | POA: Diagnosis not present

## 2022-07-04 LAB — CUP PACEART INCLINIC DEVICE CHECK
Date Time Interrogation Session: 20230804122308
HighPow Impedance: 79 Ohm
Implantable Lead Implant Date: 20220131
Implantable Lead Implant Date: 20220131
Implantable Lead Implant Date: 20220131
Implantable Lead Location: 753858
Implantable Lead Location: 753859
Implantable Lead Location: 753860
Implantable Lead Model: 137
Implantable Lead Model: 3830
Implantable Lead Model: 7840
Implantable Lead Serial Number: 1047234
Implantable Lead Serial Number: 301179
Implantable Pulse Generator Implant Date: 20220131
Lead Channel Impedance Value: 544 Ohm
Lead Channel Impedance Value: 564 Ohm
Lead Channel Impedance Value: 758 Ohm
Lead Channel Pacing Threshold Amplitude: 0.8 V
Lead Channel Pacing Threshold Amplitude: 0.9 V
Lead Channel Pacing Threshold Amplitude: 1.7 V
Lead Channel Pacing Threshold Pulse Width: 0.1 ms
Lead Channel Pacing Threshold Pulse Width: 0.4 ms
Lead Channel Pacing Threshold Pulse Width: 0.4 ms
Lead Channel Sensing Intrinsic Amplitude: 1.1 mV
Lead Channel Sensing Intrinsic Amplitude: 11.6 mV
Lead Channel Sensing Intrinsic Amplitude: 23.8 mV
Lead Channel Setting Pacing Amplitude: 0.1 V
Lead Channel Setting Pacing Amplitude: 3.5 V
Lead Channel Setting Pacing Amplitude: 3.5 V
Lead Channel Setting Pacing Pulse Width: 0.1 ms
Lead Channel Setting Pacing Pulse Width: 0.4 ms
Lead Channel Setting Sensing Sensitivity: 0.5 mV
Lead Channel Setting Sensing Sensitivity: 1 mV
Pulse Gen Serial Number: 149649

## 2022-07-04 LAB — CUP PACEART REMOTE DEVICE CHECK
Battery Remaining Longevity: 102 mo
Battery Remaining Percentage: 100 %
Brady Statistic RA Percent Paced: 0 %
Brady Statistic RV Percent Paced: 100 %
Date Time Interrogation Session: 20230804031100
HighPow Impedance: 79 Ohm
Implantable Lead Implant Date: 20220131
Implantable Lead Implant Date: 20220131
Implantable Lead Implant Date: 20220131
Implantable Lead Location: 753858
Implantable Lead Location: 753859
Implantable Lead Location: 753860
Implantable Lead Model: 137
Implantable Lead Model: 3830
Implantable Lead Model: 7840
Implantable Lead Serial Number: 1047234
Implantable Lead Serial Number: 301179
Implantable Pulse Generator Implant Date: 20220131
Lead Channel Impedance Value: 545 Ohm
Lead Channel Impedance Value: 570 Ohm
Lead Channel Impedance Value: 751 Ohm
Lead Channel Setting Pacing Amplitude: 0.1 V
Lead Channel Setting Pacing Amplitude: 3.5 V
Lead Channel Setting Pacing Amplitude: 3.5 V
Lead Channel Setting Pacing Pulse Width: 0.1 ms
Lead Channel Setting Pacing Pulse Width: 0.4 ms
Lead Channel Setting Sensing Sensitivity: 0.5 mV
Lead Channel Setting Sensing Sensitivity: 1 mV
Pulse Gen Serial Number: 149649

## 2022-07-04 NOTE — Progress Notes (Signed)
Electrophysiology Office Note Date: 07/04/2022  ID:  Shawan Sallee, DOB [birth year], MRN [id number]  PCP: Leane Call, PA-C Primary Cardiologist: Thomasene Ripple, DO Electrophysiologist: Lewayne Bunting, MD   CC: Routine ICD follow-up  Karen Warner is a 71 y.o. female seen today for Lewayne Bunting, MD for routine electrophysiology followup.  Since last being seen in our clinic the patient reports doing well overall. She did have some volume overload last month and her torsemide has been adjusted.  she denies chest pain, palpitations, dyspnea, PND, orthopnea, nausea, vomiting, dizziness, syncope, edema, weight gain, or early satiety. She has not had ICD shocks.   Device History: [employer] BiV ICD implanted 12/31/2020 for CMP/LBBB  Past Medical History:  Diagnosis Date   Anemia 12/12/2019   Arthralgia 01/14/2020   Arthritis    Asthma    Atherosclerotic heart disease of native coronary artery without angina pectoris 04/13/2014   CHF (congestive heart failure) (HCC)    Chronic bilateral low back pain with bilateral sciatica 01/13/2019   Chronic bladder pain 11/08/2014   Last Assessment & Plan:  Formatting of this note might be different from the original. Patient has chronic pain syndrome in general I think this is unfortunately worsening her pelvic pain and bladder pain her examination today was very reassuring I am advised her to use the estrogen cream I did start her on Elavil 10 milligrams at that time if she does tolerated will increase it to 25 milligrams.    Chronic headaches    Chronic idiopathic constipation 12/22/2014   Formatting of this note might be different from the original. Last Assessment & Plan:  For better bowel emptying please use either Citrucel / Benefiber start with 2 tablespoons daily and titrate up or down to effect.  Also please use glycerin suppositories as needed to assist in evacuation. Last Assessment & Plan:  Formatting of this  note might be different from the original. For better bowel empt   Chronic obstructive pulmonary disease, unspecified (HCC) 03/18/2018   Class 1 obesity due to excess calories with serious comorbidity and body mass index (BMI) of 31.0 to 31.9 in adult 06/26/2020   Colon polyps    COPD (chronic obstructive pulmonary disease) (HCC)    Coronary artery disease    DDD (degenerative disc disease), cervical 01/13/2019   Depression 11/05/2019   Diabetic polyneuropathy associated with type 2 diabetes mellitus (HCC) 02/24/2019   Diverticulitis 05/20/2018   Diverticulitis of colon 03/18/2018   Family history of ischemic heart disease (IHD) 08/16/2013   Fibromyalgia affecting multiple sites 01/14/2020   Gastro-esophageal reflux disease without esophagitis 01/13/2019   Glaucoma 11/08/2014   Hordeolum externum of left upper eyelid 03/13/2020   Hypertension 11/08/2014   IBS (irritable bowel syndrome)    Iron deficiency anemia 01/13/2019   Left renal mass 11/08/2019   Leukocytosis 05/28/2018   Low vitamin B12 level 03/13/2020   Low vitamin D level 12/12/2019   LV dysfunction 05/31/2019   Malignant essential hypertension 01/22/2016   Migraine, unspecified, not intractable, without status migrainosus 11/08/2014   Mixed hyperlipidemia 01/13/2019   Myocardial infarction (HCC)    Other premature beats 11/08/2014   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 Last Assessment & Plan:  For vaginal atrophy please place a pea size dab of Estrogen vaginal cream  into the vagina 3 times a week ( Monday, Wednesday, Friday) Last Assessment & Plan:   Formatting of this note might be different from the original. For vaginal atrophy please place a pea size dab of Estrogen vaginal cream  into the vagina    Past Surgical History:  Procedure Laterality Date   ABDOMINAL HYSTERECTOMY     BIV ICD INSERTION CRT-D N/A 12/31/2020   Procedure: BIV ICD INSERTION CRT-D;  Surgeon: Marinus Maw, MD;  Location: [facility] INVASIVE CV LAB;  Service: Cardiovascular;  Laterality: N/A;   BLEPHAROPLASTY Bilateral    COLON SURGERY     CORONARY ANGIOPLASTY WITH STENT PLACEMENT  2020   X3   CORONARY STENT INTERVENTION N/A 08/03/2020   Procedure: CORONARY STENT INTERVENTION;  Surgeon: Tonny Bollman, MD;  Location: [facility] INVASIVE CV LAB;  Service: Cardiovascular;  Laterality: N/A;   INTRAVASCULAR PRESSURE WIRE/FFR STUDY N/A 08/03/2020   Procedure: INTRAVASCULAR PRESSURE WIRE/FFR STUDY;  Surgeon: Tonny Bollman, MD;  Location[facility] INVASIVE CV LAB;  Service: Cardiovascular;  Laterality: N/A;   LEFT HEART CATH AND CORONARY ANGIOGRAPHY N/A 08/03/2020   Procedure: LEFT HEART CATH AND CORONARY ANGIOGRAPHY;  Surgeon: Tonny Bollman, MD;  Location: [facility] INVASIVE CV LAB;  Service: Cardiovascular;  Laterality: N/A;   REFRACTIVE SURGERY Left    [place]    Current Outpatient Medications  Medication Sig Dispense Refill   acetaminophen (TYLENOL) 325 MG tablet Take 2 tablets by mouth every 6 (six) hours as needed for pain.     ACETAMINOPHEN-BUTALBITAL 50-325 MG TABS Take 1 tablet by mouth every 4 (four) hours as needed for severe pain.     albuterol (PROVENTIL) (2.5 MG/3ML) 0.083% nebulizer solution INL CONTENTS OF 1 VIAL VIA NEBULIZER Q 4 H PRF WHEEZING OR COUGH     albuterol (VENTOLIN HFA) 108 (90 Base) MCG/ACT inhaler Inhale 2 puffs into the lungs every 6 (six) hours as needed for shortness of breath or wheezing.     aspirin 81 MG EC tablet 81 mg daily.     baclofen (LIORESAL) 10 MG tablet Take 10 mg by mouth 3 (three) times daily.     carvedilol (COREG) 3.125 MG tablet TAKE 1  TABLET(3.125 MG) BY MOUTH TWICE DAILY WITH A MEAL 180 tablet 3   clopidogrel (PLAVIX) 75 MG tablet TAKE 1 TABLET(75 MG) BY MOUTH DAILY 90 tablet 3   diclofenac Sodium (VOLTAREN) 1 % GEL Apply 2 g topically 2 (two) times daily as needed (pain).     dicyclomine (BENTYL) 10 MG capsule TAKE 1 CAPSULE(10 MG) BY MOUTH IN THE MORNING AND AT BEDTIME 60 capsule 0   famotidine (PEPCID) 40 MG tablet TAKE 1 TABLET(40 MG) BY MOUTH TWICE DAILY 60 tablet 2   FARXIGA 10 MG TABS tablet TAKE 1 TABLET(10 MG) BY MOUTH DAILY BEFORE BREAKFAST 30 tablet 12   ferrous sulfate 324 MG TBEC Take 324 mg by mouth daily with breakfast.     fluticasone (FLONASE) 50 MCG/ACT nasal spray Place 1 spray into both nostrils as needed for allergies or rhinitis.     folic acid (FOLVITE) 1 MG tablet Take 1 mg by mouth daily.     glycerin adult 2 g suppository Place 1 suppository rectally as needed for constipation. Apply after bowel movements. 12 suppository 0   insulin lispro (HUMALOG) 100 UNIT/ML KwikPen  Inject 16 Units into the skin 3 (three) times daily. Administer 16 units three times daily before a meal per sliding scale     lidocaine (LIDODERM) 5 % Place 1 patch onto the skin daily. Remove & Discard patch within 12 hours or as directed by MD 5 patch 0   LINZESS 72 MCG capsule Take 72 mcg by mouth every other day.     loratadine (CLARITIN) 10 MG tablet Take 10 mg by mouth daily.     nitroGLYCERIN (NITROSTAT) 0.4 MG SL tablet Place 1 tablet (0.4 mg total) under the tongue every 5 (five) minutes as needed for chest pain. 25 tablet 3   nystatin (MYCOSTATIN) 100000 UNIT/ML suspension Take 5 mLs by mouth as needed.     pantoprazole (PROTONIX) 40 MG tablet TAKE 1 TABLET(40 MG) BY MOUTH DAILY 90 tablet 3   pregabalin (LYRICA) 50 MG capsule Take 50 mg by mouth 3 (three) times daily.     ranolazine (RANEXA) 500 MG 12 hr tablet Take 1 tablet (500 mg total) by mouth 2 (two) times daily. 180 tablet 3   rosuvastatin (CRESTOR) 40 MG tablet Take  40 mg by mouth daily.     sacubitril-valsartan (ENTRESTO) 24-26 MG TAKE 1 TABLET BY MOUTH TWICE DAILY 60 tablet 4   torsemide (DEMADEX) 20 MG tablet Take twice on Tuesday and Thursday take once on other days 180 tablet 3   TRESIBA FLEXTOUCH 200 UNIT/ML FlexTouch Pen Inject 40 Units into the skin 2 (two) times daily. 40 units in the morning and up to 45 units at night     TRINTELLIX 5 MG TABS tablet Take 5 mg by mouth at bedtime.     valACYclovir (VALTREX) 1000 MG tablet Take 1,000 mg by mouth 2 (two) times daily as needed (flares).     Vericiguat (VERQUVO) 5 MG TABS Take 1 tablet (5 mg total) by mouth daily. 90 tablet 3   vitamin B-12 (CYANOCOBALAMIN) 1000 MCG tablet Take 1,000 mcg by mouth daily.     Vitamin D, Ergocalciferol, (DRISDOL) 1.25 MG (50000 UNIT) CAPS capsule Take 50,000 Units by mouth once a week. sundays     No current facility-administered medications for this visit.    Allergies:   Bee venom, Sumatriptan, Tramadol, Amoxicillin-pot clavulanate, Oxycodone, Buprenorphine hcl, Clarithromycin, Duloxetine hcl, Hydrocodone, Lactose, Liraglutide, and Tramadol hcl   Social History: Social History   Socioeconomic History   Marital status: Significant Other    Spouse name: Not on file   Number of children: 3   Years of education: Not on file   Highest education level: Not on file  Occupational History   Occupation: retired  Tobacco Use   Smoking status: Former    Types: Cigarettes    Quit date: 2021    Years since quitting: 2.5   Smokeless tobacco: Never  Vaping Use   Vaping Use: Never used  Substance and Sexual Activity   Alcohol use: Never   Drug use: Never   Sexual activity: Not on file  Other Topics Concern   Not on file  Social History Narrative   Not on file   Social Determinants of Health   Financial Resource Strain: Not on file  Food Insecurity: Not on file  Transportation Needs: Not on file  Physical Activity: Not on file  Stress: Not on file  Social  Connections: Not on file  Intimate Partner Violence: Not on file    Family History: Family History  Problem Relation Age of Onset   Lung  cancer Mother    Coronary artery disease Mother    Diabetes Mother    Glaucoma Mother    Hypertension Mother    Colon polyps Mother    Migraines Father    Heart disease Father    Diabetes Maternal Grandmother    Diabetes Daughter    Migraines Son    Hypertension Brother     Review of Systems: All other systems reviewed and are otherwise negative except as noted above.   Physical Exam: Vitals:   07/04/22 1200  BP: 116/72  Pulse: 87  SpO2: 98%  Weight: 196 lb (88.9 kg)  Height: 5\' 5"  (1.651 m)     GEN- The patient is well appearing, alert and oriented x 3 today.   HEENT: normocephalic, atraumatic; sclera clear, conjunctiva pink; hearing intact; oropharynx clear; neck supple, no JVP Lymph- no cervical lymphadenopathy Lungs- Clear to ausculation bilaterally, normal work of breathing.  No wheezes, rales, rhonchi Heart- Regular rate and rhythm, no murmurs, rubs or gallops, PMI not laterally displaced GI- soft, non-tender, non-distended, bowel sounds present, no hepatosplenomegaly Extremities- no clubbing or cyanosis. No edema; DP/PT/radial pulses 2+ bilaterally MS- no significant deformity or atrophy Skin- warm and dry, no rash or lesion; ICD pocket well healed Psych- euthymic mood, full affect Neuro- strength and sensation are intact  ICD interrogation- reviewed in detail today,  See PACEART report  EKG:  EKG is not ordered today. Personal review of EKG ordered  06/20/2022  shows AS VP at 80 bpm  Recent Labs: 01/21/2022: Magnesium 2.3 02/13/2022: ALT 15; BUN 15; Creatinine 1.0; Hemoglobin 12.5; Platelets 276; Potassium 3.9; Sodium 142   Wt Readings from Last 3 Encounters:  07/04/22 196 lb (88.9 kg)  06/20/22 196 lb 6.4 oz (89.1 kg)  05/15/22 193 lb (87.5 kg)     Other studies Reviewed: Additional studies/ records that were  reviewed today include: Previous EP office notes.   Assessment and Plan:  1.  Chronic systolic dysfunction s/p Chemical engineer CRT-D  euvolemic today Stable on an appropriate medical regimen Normal ICD function See Pace Art report No changes today   Current medicines are reviewed at length with the patient today.   =  Labs/ tests ordered today include:  Orders Placed This Encounter  Procedures   CUP PACEART INCLINIC DEVICE CHECK     Disposition:   Follow up with Dr. Lovena Le in 12 months    Signed, Shirley Friar, PA-C  07/04/2022 12:28 PM  [address] [place] [address] [phone] (office) [phone] (fax)

## 2022-07-04 NOTE — Patient Instructions (Signed)
Medication Instructions:  Your physician recommends that you continue on your current medications as directed. Please refer to the Current Medication list given to you today.  *If you need a refill on your cardiac medications before your next appointment, please call your pharmacy*   Lab Work: None  If you have labs (blood work) drawn today and your tests are completely normal, you will receive your results only by: MyChart Message (if you have MyChart) OR A paper copy in the mail If you have any lab test that is abnormal or we need to change your treatment, we will call you to review the results.   Follow-Up: At [facility], you and your health needs are our priority.  As part of our continuing mission to provide you with exceptional heart care, we have created designated Provider Care Teams.  These Care Teams include your primary Cardiologist (physician) and Advanced Practice Providers (APPs -  Physician Assistants and Nurse Practitioners) who all work together to provide you with the care you need, when you need it.  We recommend signing up for the patient portal called "MyChart".  Sign up information is provided on this After Visit Summary.  MyChart is used to connect with patients for Virtual Visits (Telemedicine).  Patients are able to view lab/test results, encounter notes, upcoming appointments, etc.  Non-urgent messages can be sent to your provider as well.   To learn more about what you can do with MyChart, go to [email or web address].    Your next appointment:   1 year(s)  The format for your next appointment:   In Person  Provider:   Gregg Taylor, MD{ 

## 2022-07-10 ENCOUNTER — Encounter: Payer: Self-pay | Admitting: Physical Therapy

## 2022-08-03 IMAGING — DX DG ABDOMEN 1V
2 series · 2 of 2 positions shown · non-contrast
Comparison: Abdominal radiograph dated 04/11/2022.

CLINICAL DATA: Evaluate for stool burden obstruction.

EXAM:
ABDOMEN - 1 VIEW

[abdomen kub (1 of 2)]
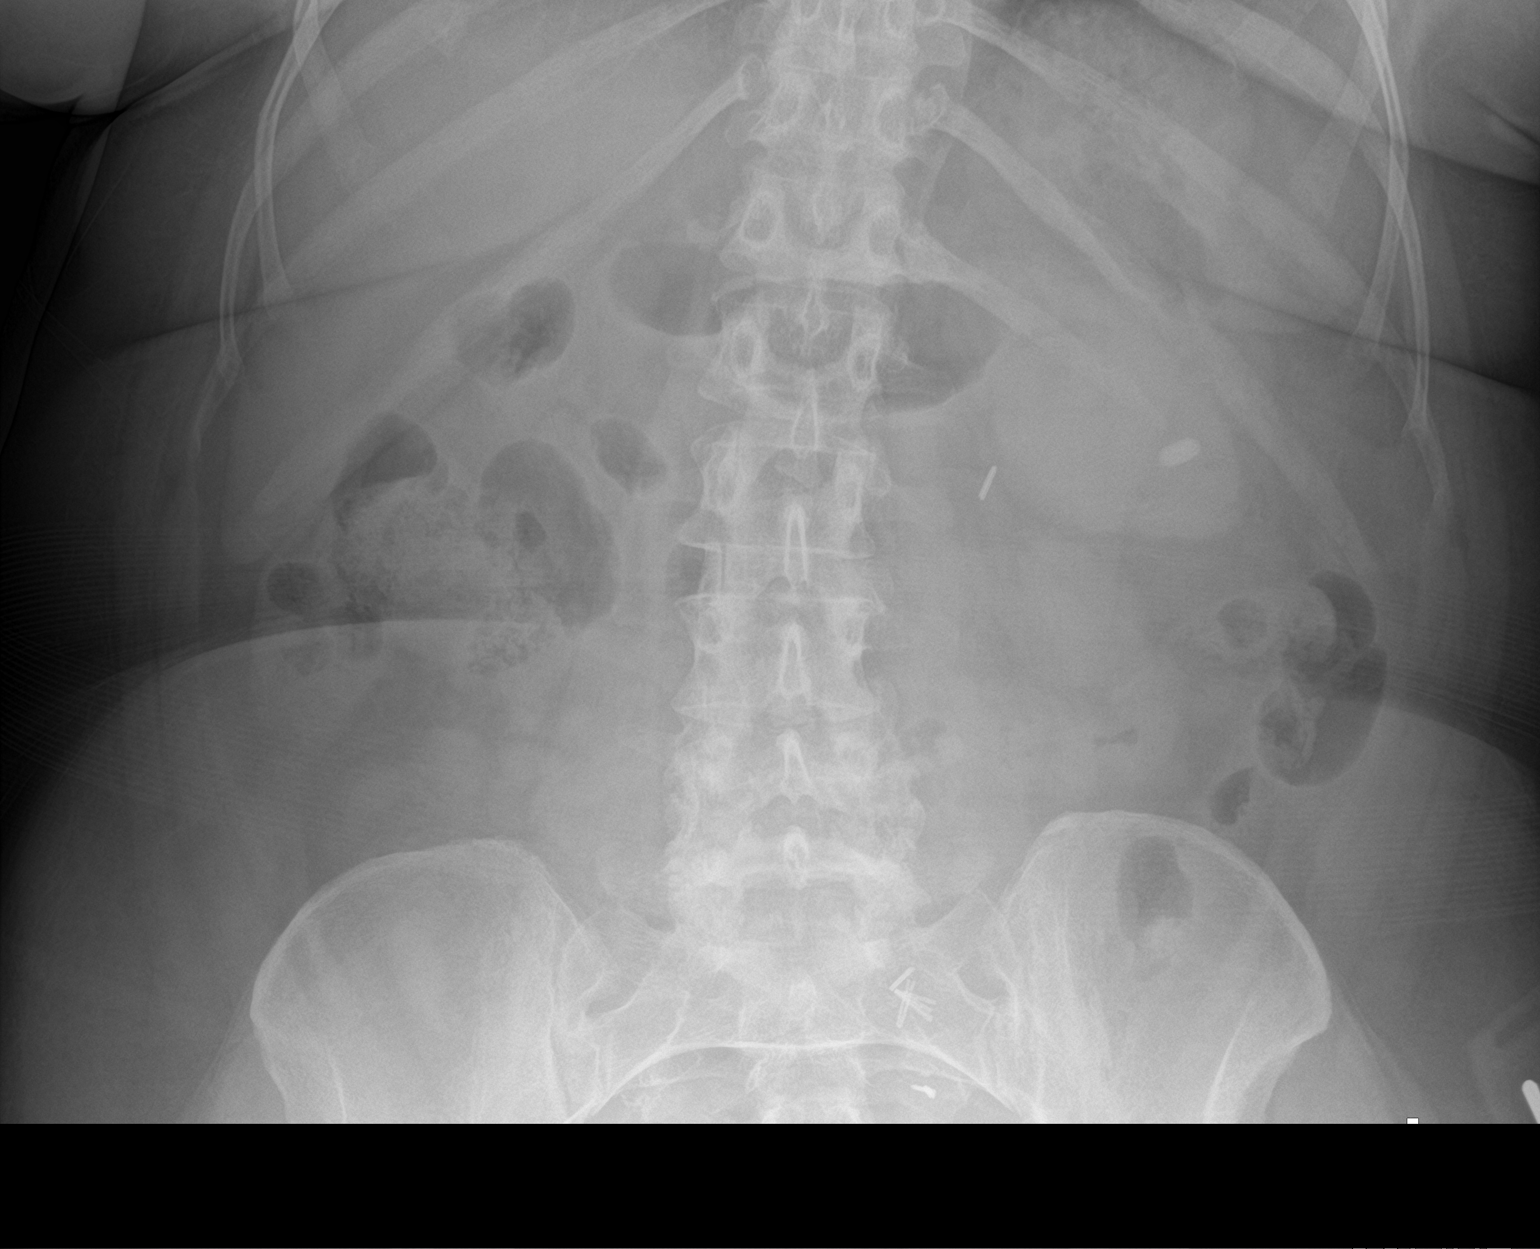

[abdomen kub (2 of 2)]
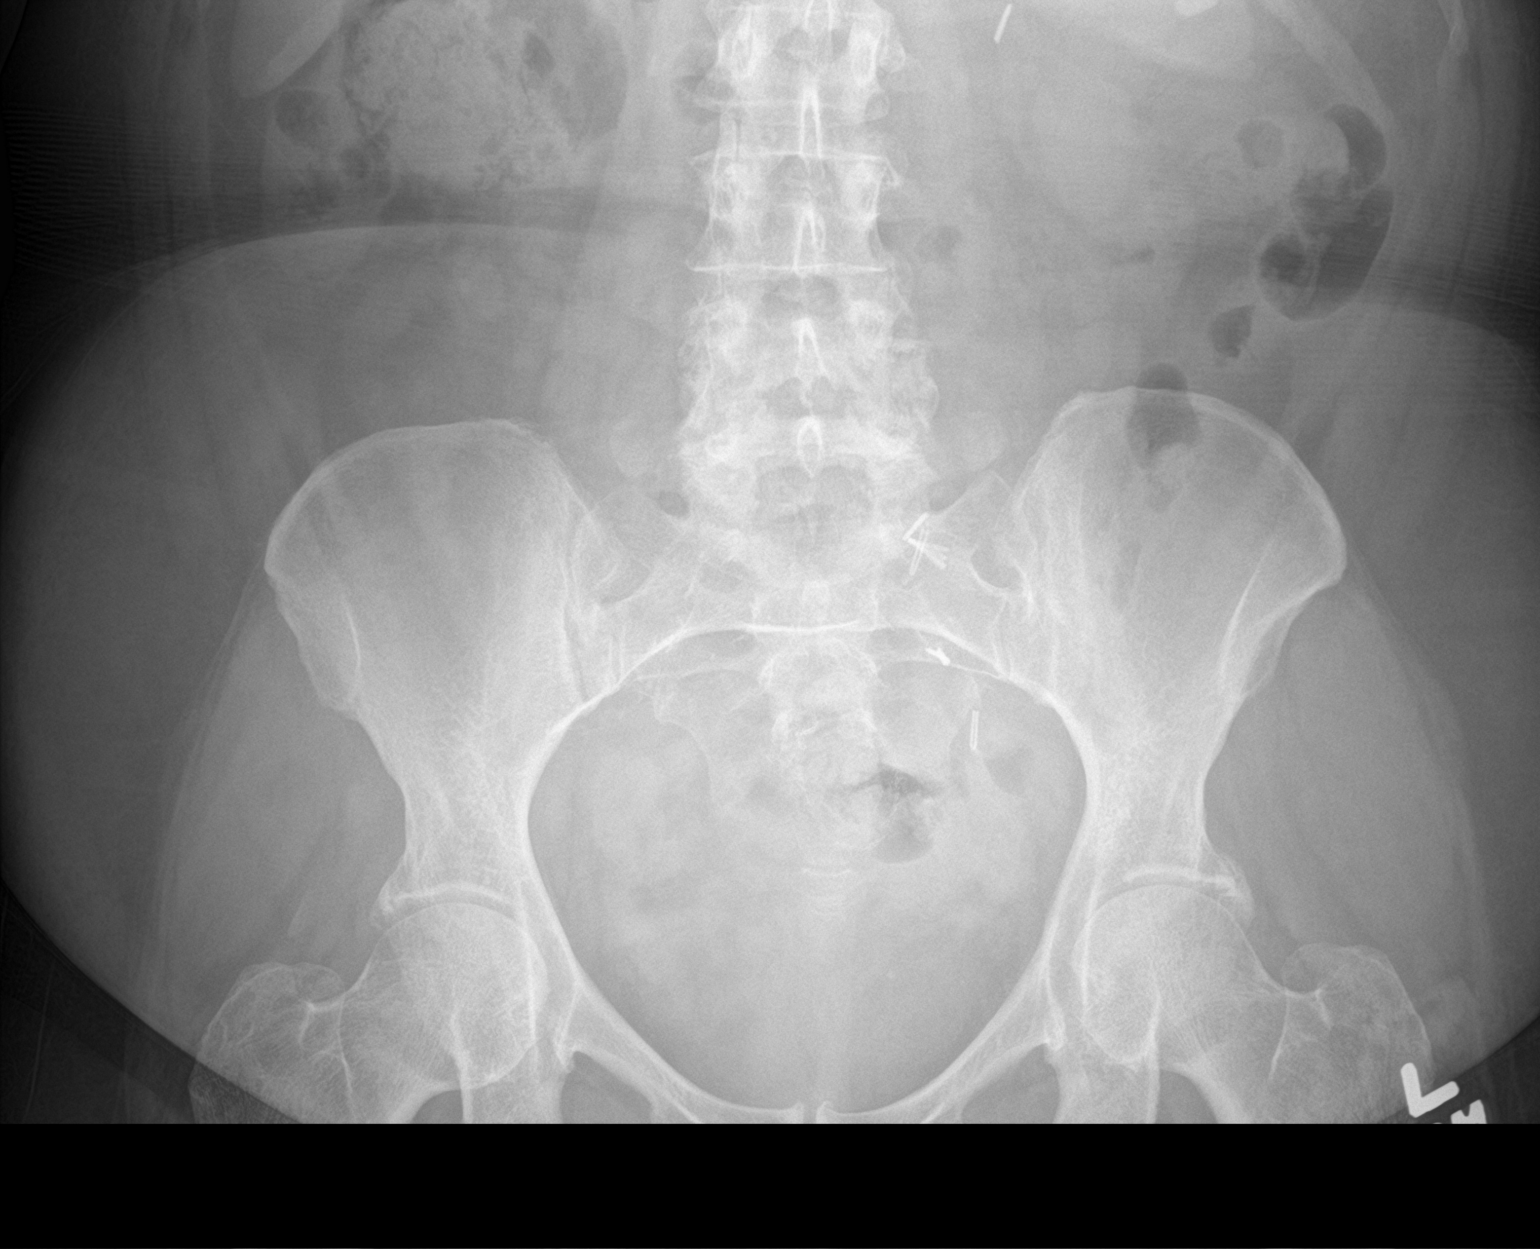

[2 of 2 positions shown; findings below may reference images not displayed]

FINDINGS: No significant colonic stool burden. There is no bowel dilatation or
evidence of obstruction. No free air. A 1 cm radiopaque focus over
the left flank may represent a kidney stone or an ingested pill
within the bowel. There is degenerative changes of the lower lumbar
spine. The soft tissues are unremarkable. Multiple surgical clips
noted.
IMPRESSION: No significant colonic stool burden. No bowel obstruction.

## 2022-08-12 ENCOUNTER — Other Ambulatory Visit: Payer: Self-pay | Admitting: Gastroenterology

## 2022-08-20 NOTE — Progress Notes (Signed)
Karen Warner  [address] [place],  [place]  [address] [phone]  Clinic Day:  08/21/2022  Referring physician: Maggie Schwalbe, PA-C  HISTORY OF PRESENT ILLNESS:  The patient is a 71 y.o. 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  In the past, IV Feraheme was effective in replenishing her iron stores and improving her hemoglobin.  She comes in today to reassess her anemia.  Since her last visit, the patient has been doing fairly well.  She denies having increased fatigue or any overt forms of blood loss which concern her for progressive anemia.  However, she has begun to have more ice cravings recently.  PHYSICAL EXAM:  Blood pressure (!) 100/55, pulse 83, temperature 97.6 F (36.4 C), resp. rate 16, height 5\' 5"  (1.651 m), weight 198 lb 4.8 oz (89.9 kg), SpO2 96 %. Wt Readings from Last 3 Encounters:  08/21/22 198 lb 4.8 oz (89.9 kg)  07/04/22 196 lb (88.9 kg)  06/20/22 196 lb 6.4 oz (89.1 kg)   Body mass index is 33 kg/m. Performance status (ECOG): 1 - Symptomatic but completely ambulatory Physical Exam Constitutional:      General: She is not in acute distress.    Appearance: Normal appearance. She is normal weight.  HENT:     Head: Normocephalic and atraumatic.  Eyes:     General: No scleral icterus.    Extraocular Movements: Extraocular movements intact.     Conjunctiva/sclera: Conjunctivae normal.     Pupils: Pupils are equal, round, and reactive to light.  Cardiovascular:     Rate and Rhythm: Normal rate and regular rhythm.     Pulses: Normal pulses.     Heart sounds: Normal heart sounds. No murmur heard.    No friction rub. No gallop.  Pulmonary:     Effort: Pulmonary effort is normal. No respiratory distress.     Breath sounds: Normal breath sounds.  Abdominal:     General: Bowel sounds are normal. There is no distension.     Palpations: Abdomen is soft. There is no  hepatomegaly, splenomegaly or mass.     Tenderness: There is no abdominal tenderness.  Musculoskeletal:        General: Normal range of motion.     Cervical back: Normal range of motion and neck supple.     Right lower leg: No edema.     Left lower leg: No edema.  Lymphadenopathy:     Cervical: No cervical adenopathy.  Skin:    General: Skin is warm and dry.  Neurological:     General: No focal deficit present.     Mental Status: She is alert and oriented to person, place, and time. Mental status is at baseline.  Psychiatric:        Mood and Affect: Mood normal.        Behavior: Behavior normal.        Thought Content: Thought content normal.        Judgment: Judgment normal.    LABS:      Latest Ref Rng & Units 08/21/2022   12:00 AM 02/13/2022   12:00 AM 08/16/2021   12:00 AM  CBC  WBC  7.0     6.5     6.4   Hemoglobin 12.0 - 16.0 12.0     12.5     12.8   Hematocrit 36 - 46 36     40     38  Platelets 150 - 400 K/uL 289     276     250      This result is from an external source.      Latest Ref Rng & Units 02/13/2022   12:00 AM 01/21/2022    2:39 PM 09/17/2021   12:40 PM  CMP  Glucose 70 - 99 mg/dL  222  132   BUN 4 - 21 15     15  15    Creatinine 0.5 - 1.1 1.0     1.21  1.10   Sodium 137 - 147 142     142  144   Potassium 3.5 - 5.1 mEq/L 3.9     4.1  4.6   Chloride 99 - 108 107     103  105   CO2 13 - 22 29     25  23    Calcium 8.7 - 10.7 8.8     9.2  9.7   Alkaline Phos 25 - 125 74        AST 13 - 35 25        ALT 7 - 35 U/L 15           This result is from an external source.    Latest Reference Range & Units 08/21/22 13:55  Iron 28 - 170 ug/dL 61  UIBC ug/dL 302  TIBC 250 - 450 ug/dL 363  Saturation Ratios 10.4 - 31.8 % 17  Ferritin 11 - 307 ng/mL 10 (L)  (L): Data is abnormally low  ASSESSMENT & PLAN:  Assessment/Plan:  A 71 y.o. female with anemia secondary to iron deficiency and previous renal insufficiency.  I am pleased as her hemoglobin remains  at an ideal level.  However, her iron parameters show a low ferritin level, which suggests her iron stores are low.  Based upon this, I will arrange for her to receive IV over these forthcoming weeks.  I will see her back in 4 months for repeat clinical assessment.  The patient understands all the plans discussed today and is in agreement with them.    Shandelle Borrelli Macarthur Critchley, MD

## 2022-08-21 ENCOUNTER — Inpatient Hospital Stay (INDEPENDENT_AMBULATORY_CARE_PROVIDER_SITE_OTHER): Payer: Medicare Other | Admitting: Oncology

## 2022-08-21 ENCOUNTER — Inpatient Hospital Stay: Payer: Medicare Other | Attending: Oncology

## 2022-08-21 ENCOUNTER — Other Ambulatory Visit: Payer: Self-pay | Admitting: Cardiology

## 2022-08-21 VITALS — BP 100/55 | HR 83 | Temp 97.6°F | Resp 16 | Ht 65.0 in | Wt 198.3 lb

## 2022-08-21 DIAGNOSIS — N189 Chronic kidney disease, unspecified: Secondary | ICD-10-CM | POA: Diagnosis not present

## 2022-08-21 DIAGNOSIS — D509 Iron deficiency anemia, unspecified: Secondary | ICD-10-CM | POA: Insufficient documentation

## 2022-08-21 DIAGNOSIS — D508 Other iron deficiency anemias: Secondary | ICD-10-CM

## 2022-08-21 LAB — IRON AND TIBC
Iron: 61 ug/dL (ref 28–170)
Saturation Ratios: 17 % (ref 10.4–31.8)
TIBC: 363 ug/dL (ref 250–450)
UIBC: 302 ug/dL

## 2022-08-21 LAB — CBC: RBC: 4.07 (ref 3.87–5.11)

## 2022-08-21 LAB — CBC AND DIFFERENTIAL
HCT: 36 (ref 36–46)
Hemoglobin: 12 (ref 12.0–16.0)
Neutrophils Absolute: 3.85
Platelets: 289 10*3/uL (ref 150–400)
WBC: 7

## 2022-08-21 LAB — FERRITIN: Ferritin: 10 ng/mL — ABNORMAL LOW (ref 11–307)

## 2022-08-21 NOTE — Telephone Encounter (Signed)
Rx refill sent to pharmacy. 

## 2022-08-26 ENCOUNTER — Encounter: Payer: Self-pay | Admitting: Oncology

## 2022-08-27 ENCOUNTER — Telehealth: Payer: Self-pay | Admitting: Oncology

## 2022-08-27 NOTE — Telephone Encounter (Signed)
Patient has been scheduled. Aware of appt date and time    Scheduling Message Entered by Lavera Guise A on 08/26/2022 at  8:25 PM Priority: Routine <No visit type provided>  Department: [facility]  Provider:   Scheduling Notes:  Please let pt know her ferritin is low and that she needs more IV iron.  Please have it set up at [address].

## 2022-08-28 ENCOUNTER — Other Ambulatory Visit: Payer: Self-pay | Admitting: Pharmacist

## 2022-09-02 ENCOUNTER — Telehealth: Payer: Self-pay | Admitting: Cardiology

## 2022-09-02 NOTE — Telephone Encounter (Signed)
Patient called to talk to Dr. Harriet Masson or nurse about her heart moniotor. Please call back

## 2022-09-02 NOTE — Telephone Encounter (Signed)
Remote report received and reviewed.  Device is working  normally.  No lead issues.    No events noted.  Can not identify a reason that her alarm would have "went off" on her device.

## 2022-09-02 NOTE — Telephone Encounter (Signed)
Patient reports that this morning around 1 am, she felt tightness in her chest and her device alarm went off. She left her NTG in the care and took 2 baby aspirins. The tightness lasted for about 30 minutes. She denied palitations and dizziness. Education given on how to carry and store NTG.  Requests for device download sent.

## 2022-09-09 ENCOUNTER — Other Ambulatory Visit: Payer: Self-pay | Admitting: Nurse Practitioner

## 2022-09-09 MED FILL — Ferumoxytol Inj 510 MG/17ML (30 MG/ML) (Elemental Fe): INTRAVENOUS | Qty: 17 | Status: AC

## 2022-09-10 ENCOUNTER — Inpatient Hospital Stay: Payer: Medicare Other | Attending: Oncology

## 2022-09-10 VITALS — BP 116/59 | HR 84 | Temp 98.3°F | Resp 18 | Ht 65.0 in | Wt 199.1 lb

## 2022-09-10 DIAGNOSIS — D508 Other iron deficiency anemias: Secondary | ICD-10-CM

## 2022-09-10 DIAGNOSIS — D509 Iron deficiency anemia, unspecified: Secondary | ICD-10-CM | POA: Diagnosis present

## 2022-09-10 DIAGNOSIS — Z23 Encounter for immunization: Secondary | ICD-10-CM | POA: Diagnosis not present

## 2022-09-10 MED ORDER — INFLUENZA VAC SPLIT QUAD 0.5 ML IM SUSY
0.5000 mL | PREFILLED_SYRINGE | Freq: Once | INTRAMUSCULAR | Status: AC
  Administered 2022-09-10: 0.5 mL via INTRAMUSCULAR
  Filled 2022-09-10: qty 0.5

## 2022-09-10 MED ORDER — SODIUM CHLORIDE 0.9 % IV SOLN
510.0000 mg | Freq: Once | INTRAVENOUS | Status: AC
  Administered 2022-09-10: 510 mg via INTRAVENOUS
  Filled 2022-09-10: qty 510

## 2022-09-10 MED ORDER — SODIUM CHLORIDE 0.9 % IV SOLN: Freq: Once | INTRAVENOUS | Status: AC

## 2022-09-10 NOTE — Progress Notes (Deleted)
09/10/2022 Karen Warner [id number] Jan 22, 1951  Referring provider: Leane Call, PA-C Primary GI doctor: Dr. Lavon Paganini  ASSESSMENT AND PLAN:   SBO (small bowel obstruction) (HCC) with history of partial colectomy and hysterectomy in the past likely adhesions.  low fiber diet, keep stool loose, avoid constipation ER precautions discussed  Chronic idiopathic constipation   History of hemi colectomy Due to diverticulitis 2019  High risk for surgical/endoscopic procedures. Patient with CAD s/p stents on plavix, CHF with AICD and currently with chronic angina Very high risk for surgical and endoscopic procedures     History of Present Illness:  71 y.o. female  with a past medical history of coronary artery disease s/p stent plavix, recently started on ranexa for chronic angina, ischemic cardiomyopathy with severe CHF s/p biventricular ICD placement 12/2020, DM, IDA intestinal AVMs, diverticular disease, s/p left hemicolectomy,  epiploic appendicitis, colon polyps and GERD, hiatal hernia, gastritis and others listed below, returns to clinic today for evaluation of nausea and lower AB pain.  03/20/2022 seen by Lucrezia Europe for bloating and GERD, advised low FODMAP diet, low fiber,  possible SIBO testing.  05/2021 CT AB and pelvis without contrast possible segment of small bowel wall thickening in right paramidline abdomen just below the umbilicus, stool throughout the colon 10/25/2018 EGD  gastritis, hiatal hernia.  Gastric biopsies negative for H. 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 Recall colonoscopy 5 years recommended  04/10/2022 admitted to [facility] with SBO, unable to see all reports but able to see CT AB with 2 transition points.  Saw surgeon in the hospital, was  given one day with NG tube and then finally bowels started to move so she was able to be discharged.  04/29/2022 AB Xray with PCP for follow up shows no bowel obstruction.  05/15/2022 KUB showed no obstruction, supposed to continue on Linzess 30 minutes before food if continued to have issues could do trial of Trulance.  She has had hemicolectomy 2019, hysterectomy.    Current Medications:   Current Outpatient Medications (Endocrine & Metabolic):    FARXIGA 10 MG TABS tablet, TAKE 1 TABLET(10 MG) BY MOUTH DAILY BEFORE BREAKFAST   insulin lispro (HUMALOG) 100 UNIT/ML KwikPen, Inject 16 Units into the skin 3 (three) times daily. Administer 16 units three times daily before a meal per sliding scale   TRESIBA FLEXTOUCH 200 UNIT/ML FlexTouch Pen, Inject 40 Units into the skin 2 (two) times daily. 40 units in the morning and up to 45 units at night  Current Outpatient Medications (Cardiovascular):    carvedilol (COREG) 3.125 MG tablet, TAKE 1 TABLET(3.125 MG) BY MOUTH TWICE DAILY WITH A MEAL   nitroGLYCERIN (NITROSTAT) 0.4 MG SL tablet, Place 1 tablet (0.4 mg total) under the tongue every 5 (five) minutes as needed for chest pain.   ranolazine (RANEXA) 500 MG 12 hr tablet, Take 1 tablet (500 mg total) by mouth 2 (two) times daily.   rosuvastatin (CRESTOR) 40 MG tablet, Take 40 mg by mouth daily.   sacubitril-valsartan (ENTRESTO) 24-26 MG, Take 1 tablet by mouth 2 (two) times daily.   torsemide (DEMADEX) 20 MG tablet, Take twice on Tuesday and Thursday take once on other days   Vericiguat (VERQUVO) 5 MG TABS, Take 1 tablet (5 mg total) by mouth daily.  Current Outpatient Medications (Respiratory):    albuterol (PROVENTIL) (2.5 MG/3ML)  0.083% nebulizer solution, INL CONTENTS OF 1 VIAL VIA NEBULIZER Q 4 H PRF WHEEZING OR COUGH   albuterol (VENTOLIN HFA) 108 (90 Base) MCG/ACT inhaler, Inhale 2 puffs into the lungs every 6 (six) hours as needed for shortness of breath or wheezing.   fluticasone  (FLONASE) 50 MCG/ACT nasal spray, Place 1 spray into both nostrils as needed for allergies or rhinitis.   loratadine (CLARITIN) 10 MG tablet, Take 10 mg by mouth daily.  Current Outpatient Medications (Analgesics):    acetaminophen (TYLENOL) 325 MG tablet, Take 2 tablets by mouth every 6 (six) hours as needed for pain.   ACETAMINOPHEN-BUTALBITAL 50-325 MG TABS, Take 1 tablet by mouth every 4 (four) hours as needed for severe pain.   aspirin 81 MG EC tablet, 81 mg daily.  Current Outpatient Medications (Hematological):    clopidogrel (PLAVIX) 75 MG tablet, TAKE 1 TABLET(75 MG) BY MOUTH DAILY   ferrous sulfate 324 MG TBEC, Take 324 mg by mouth daily with breakfast.   folic acid (FOLVITE) 1 MG tablet, Take 1 mg by mouth daily.   vitamin B-12 (CYANOCOBALAMIN) 1000 MCG tablet, Take 1,000 mcg by mouth daily.  Current Outpatient Medications (Other):    baclofen (LIORESAL) 10 MG tablet, Take 10 mg by mouth 3 (three) times daily.   diclofenac Sodium (VOLTAREN) 1 % GEL, Apply 2 g topically 2 (two) times daily as needed (pain).   dicyclomine (BENTYL) 10 MG capsule, TAKE 1 CAPSULE(10 MG) BY MOUTH IN THE MORNING AND AT BEDTIME   famotidine (PEPCID) 40 MG tablet, TAKE 1 TABLET(40 MG) BY MOUTH TWICE DAILY   glycerin adult 2 g suppository, Place 1 suppository rectally as needed for constipation. Apply after bowel movements.   lidocaine (LIDODERM) 5 %, Place 1 patch onto the skin daily. Remove & Discard patch within 12 hours or as directed by MD   LINZESS 72 MCG capsule, Take 72 mcg by mouth every other day.   nystatin (MYCOSTATIN) 100000 UNIT/ML suspension, Take 5 mLs by mouth as needed.   pantoprazole (PROTONIX) 40 MG tablet, TAKE 1 TABLET(40 MG) BY MOUTH DAILY   pregabalin (LYRICA) 50 MG capsule, Take 50 mg by mouth 3 (three) times daily.   TRINTELLIX 5 MG TABS tablet, Take 5 mg by mouth at bedtime.   valACYclovir (VALTREX) 1000 MG tablet, Take 1,000 mg by mouth 2 (two) times daily as needed (flares).    Vitamin D, Ergocalciferol, (DRISDOL) 1.25 MG (50000 UNIT) CAPS capsule, Take 50,000 Units by mouth once a week. sundays  Surgical History:  She  has a past surgical history that includes Coronary angioplasty with stent (2020); Abdominal hysterectomy; Colon surgery; Blepharoplasty (Bilateral); Refractive surgery (Left); LEFT HEART CATH AND CORONARY ANGIOGRAPHY (N/A, 08/03/2020); CORONARY STENT INTERVENTION (N/A, 08/03/2020); INTRAVASCULAR PRESSURE WIRE/FFR STUDY (N/A, 08/03/2020); and BIV ICD INSERTION CRT-D (N/A, 12/31/2020). Family History:  Her family history includes Colon polyps in her mother; Coronary artery disease in her mother; Diabetes in her daughter, maternal grandmother, and mother; Glaucoma in her mother; Heart disease in her father; Hypertension in her brother and mother; Lung cancer in her mother; Migraines in her father and son. Social History:   reports that she quit smoking about 2 years ago. Her smoking use included cigarettes. She has never used smokeless tobacco. She reports that she does not drink alcohol and does not use drugs.  Current Medications, Allergies, Past Medical History, Past Surgical History, Family History and Social History were reviewed in [employer] record.  Physical Exam: There were  no vitals taken for this visit. General:   Pleasant, well developed female in no acute distress Heart : Regular rate and rhythm; no murmurs Pulm: Clear anteriorly; no wheezing Abdomen:  Soft, Obese AB,  decreased bowel sounds upper and right lower quadrant, increase  bowel sounds left lower. mild tenderness in the LLQ. With guarding and Without rebound, No organomegaly appreciated. Rectal: Not evaluated Extremities:  without  edema. Neurologic:  Alert and  oriented x4;  No focal deficits.  Psych:  Cooperative. Normal mood and affect.   Doree Albee, PA-C 09/10/22

## 2022-09-10 NOTE — Patient Instructions (Signed)

## 2022-09-11 ENCOUNTER — Ambulatory Visit: Payer: Medicare Other | Admitting: Physician Assistant

## 2022-09-17 ENCOUNTER — Inpatient Hospital Stay: Payer: Medicare Other

## 2022-09-17 VITALS — BP 90/67 | HR 84 | Temp 97.6°F | Resp 20 | Ht 65.0 in | Wt 199.2 lb

## 2022-09-17 DIAGNOSIS — D509 Iron deficiency anemia, unspecified: Secondary | ICD-10-CM | POA: Diagnosis not present

## 2022-09-17 DIAGNOSIS — D508 Other iron deficiency anemias: Secondary | ICD-10-CM

## 2022-09-17 MED ORDER — SODIUM CHLORIDE 0.9 % IV SOLN
510.0000 mg | Freq: Once | INTRAVENOUS | Status: AC
  Administered 2022-09-17: 510 mg via INTRAVENOUS
  Filled 2022-09-17: qty 510

## 2022-09-17 MED ORDER — SODIUM CHLORIDE 0.9 % IV SOLN: Freq: Once | INTRAVENOUS | Status: AC

## 2022-09-17 NOTE — Patient Instructions (Signed)
Iron Deficiency Anemia, Adult  Iron deficiency anemia is a condition in which the concentration of red blood cells or hemoglobin in the blood is below normal because of too little iron. Hemoglobin is a substance in red blood cells that carries oxygen to the body's tissues. When the concentration of red blood cells or hemoglobin is too low, not enough oxygen reaches these tissues. Iron deficiency anemia is usually long-lasting, and it develops over time. It may or may not cause symptoms. It is a common type of anemia. What are the causes? This condition may be caused by: Not enough iron in the diet. Abnormal absorption in the gut. Blood loss. What increases the risk? You are more likely to develop this condition if you get menstrual periods (menstruate) or are pregnant. What are the signs or symptoms? Symptoms of this condition may include: Pale skin, lips, and nail beds. Weakness, dizziness, and getting tired easily. Shortness of breath when moving or exercising. Cold hands or feet. Mild anemia may not cause any symptoms. How is this diagnosed? This condition is diagnosed based on: Your medical history. A physical exam. Blood tests. How is this treated? This condition is treated by correcting the cause of your iron deficiency. Treatment may involve: Adding iron-rich foods to your diet. Taking iron supplements. If you are pregnant or breastfeeding, you may need to take extra iron because your normal diet usually does not provide the amount of iron that you need. Increasing vitamin C intake. Vitamin C helps your body absorb iron. Your health care provider may recommend that you take iron supplements along with a glass of orange juice or a vitamin C supplement. Medicines to make heavy menstrual flow lighter. Surgery or additional testing procedures to determine the cause of your anemia. You may need repeat blood tests to determine whether treatment is working. If the treatment does not  seem to be working, you may need more tests. Follow these instructions at home: Medicines Take over-the-counter and prescription medicines only as told by your health care provider. This includes iron supplements and vitamins. This is important because too much iron can be harmful. For the best iron absorption, you should take iron supplements when your stomach is empty. If you cannot tolerate them on an empty stomach, you may need to take them with food. Do not drink milk or take antacids at the same time as your iron supplements. Milk and antacids may interfere with how your body absorbs iron. Iron supplements may turn stool (feces) a darker color and it may appear black. If you cannot tolerate taking iron supplements by mouth, talk with your health care provider about taking them through an IV or through an injection into a muscle. Eating and drinking Talk with your health care provider before changing your diet. Your provider may recommend that you eat foods that contain a lot of iron, such as: Liver. Low-fat (lean) beef. Breads and cereals that have iron added to them (are fortified). Eggs. Dried fruit. Dark green, leafy vegetables. To help your body use the iron from iron-rich foods, eat those foods at the same time as fresh fruits and vegetables that are high in vitamin C. Foods that are high in vitamin C include: Oranges. Peppers. Tomatoes. Mangoes. Managing constipation If you are taking an iron supplement, it may cause constipation. To prevent or treat constipation, you may need to: Drink enough fluid to keep your urine pale yellow. Take over-the-counter or prescription medicines. Eat foods that are high in fiber, such   as beans, whole grains, and fresh fruits and vegetables. Limit foods that are high in fat and processed sugars, such as fried or sweet foods. General instructions Return to your normal activities as told by your health care provider. Ask your health care provider  what activities are safe for you. Keep all follow-up visits. Contact a health care provider if: You feel nauseous or you vomit. You feel weak. You become light-headed when getting up from a sitting or lying down position. You have unexplained sweating. You develop symptoms of constipation. You have a heaviness in your chest. You have trouble breathing with physical activity. Get help right away if: You faint. If this happens, do not drive yourself to the hospital. You have an irregular or rapid heartbeat. Summary Iron deficiency anemia is a condition in which the concentration of red blood cells or hemoglobin in the blood is below normal because of too little iron. This condition is treated by correcting the cause of your iron deficiency. Take over-the-counter and prescription medicines only as told by your health care provider. This includes iron supplements and vitamins. To help your body use the iron from iron-rich foods, eat those foods at the same time as fresh fruits and vegetables that are high in vitamin C. Seek medical help if you have signs or symptoms of worsening anemia. This information is not intended to replace advice given to you by your health care provider. Make sure you discuss any questions you have with your health care provider. Document Revised: 12/25/2021 Document Reviewed: 12/25/2021 Elsevier Patient Education  2023 [employer]  

## 2022-10-03 ENCOUNTER — Ambulatory Visit (INDEPENDENT_AMBULATORY_CARE_PROVIDER_SITE_OTHER): Payer: Medicare Other

## 2022-10-03 DIAGNOSIS — I255 Ischemic cardiomyopathy: Secondary | ICD-10-CM

## 2022-10-03 LAB — CUP PACEART REMOTE DEVICE CHECK
Battery Remaining Longevity: 96 mo
Battery Remaining Percentage: 100 %
Brady Statistic RA Percent Paced: 0 %
Brady Statistic RV Percent Paced: 100 %
Date Time Interrogation Session: 20231103031000
HighPow Impedance: 84 Ohm
Implantable Lead Connection Status: 753985
Implantable Lead Connection Status: 753985
Implantable Lead Connection Status: 753985
Implantable Lead Implant Date: 20220131
Implantable Lead Implant Date: 20220131
Implantable Lead Implant Date: 20220131
Implantable Lead Location: 753858
Implantable Lead Location: 753859
Implantable Lead Location: 753860
Implantable Lead Model: 137
Implantable Lead Model: 3830
Implantable Lead Model: 7840
Implantable Lead Serial Number: 1047234
Implantable Lead Serial Number: 301179
Implantable Pulse Generator Implant Date: 20220131
Lead Channel Impedance Value: 545 Ohm
Lead Channel Impedance Value: 558 Ohm
Lead Channel Impedance Value: 725 Ohm
Lead Channel Setting Pacing Amplitude: 0.1 V
Lead Channel Setting Pacing Amplitude: 3.5 V
Lead Channel Setting Pacing Amplitude: 3.5 V
Lead Channel Setting Pacing Pulse Width: 0.1 ms
Lead Channel Setting Pacing Pulse Width: 0.4 ms
Lead Channel Setting Sensing Sensitivity: 0.5 mV
Lead Channel Setting Sensing Sensitivity: 1 mV
Pulse Gen Serial Number: 149649
Zone Setting Status: 755011

## 2022-10-08 ENCOUNTER — Telehealth: Payer: Self-pay | Admitting: Cardiology

## 2022-10-08 NOTE — Telephone Encounter (Signed)
Pt c/o medication issue:  1. Name of Medication: Vericiguat (VERQUVO) 5 MG TABS   2. How are you currently taking this medication (dosage and times per day)? 1 tablet daily  3. Are you having a reaction (difficulty breathing--STAT)? no  4. What is your medication issue? Patient states she has to reapply for the patient assistance and there is a part that needs to be filled out by the office.

## 2022-10-13 NOTE — Progress Notes (Signed)
Remote ICD transmission.   

## 2022-10-14 ENCOUNTER — Encounter: Payer: Self-pay | Admitting: Physician Assistant

## 2022-10-14 ENCOUNTER — Ambulatory Visit (INDEPENDENT_AMBULATORY_CARE_PROVIDER_SITE_OTHER): Payer: Medicare Other | Admitting: Physician Assistant

## 2022-10-14 VITALS — BP 100/60 | HR 83 | Ht 65.0 in | Wt 202.4 lb

## 2022-10-14 DIAGNOSIS — Z9049 Acquired absence of other specified parts of digestive tract: Secondary | ICD-10-CM

## 2022-10-14 DIAGNOSIS — R140 Abdominal distension (gaseous): Secondary | ICD-10-CM | POA: Diagnosis not present

## 2022-10-14 DIAGNOSIS — K5904 Chronic idiopathic constipation: Secondary | ICD-10-CM | POA: Diagnosis not present

## 2022-10-14 DIAGNOSIS — I255 Ischemic cardiomyopathy: Secondary | ICD-10-CM | POA: Diagnosis not present

## 2022-10-14 MED ORDER — LUBIPROSTONE 8 MCG PO CAPS: 8.0000 ug | ORAL_CAPSULE | Freq: Two times a day (BID) | ORAL | 3 refills | Status: DC

## 2022-10-14 NOTE — Progress Notes (Signed)
Chief Complaint: Abdominal pain  HPI:    Karen Warner is a 71 year old female, known to Dr. Lavon Paganini, with a past medical history as listed below including CAD status post stent on Plavix, ischemic cardiomyopathy with severe CHF status post biventricular ICD placement 12/2020, diabetes, IDA with intestinal AVMs, diverticular disease status post left hemicolectomy, epiploic appendicitis, colon polyps, GERD, hiatal hernia, gastritis and multiple others, who presents to clinic today for a complaint of abdominal pain.    10/07/2019 colonoscopy with diminutive colon polyps, diverticula and anastomosis left colon hemicolectomy at 6 cm.    05/15/2022 patient seen in clinic for evaluation of nausea and lower abdominal pain.  That time discussed history of small bowel obstruction.  Had a CT which showed 2 transition points likely adhesions and x-ray was ordered and recommended low fiber diet.  Noted that patient was high risk for surgical/endoscopic procedures given CAD status post stents, CHF with AICD and chronic angina.    05/16/2022 abdominal x-ray with no significant colonic stool burden.  No bowel obstruction.  Patient was continued on Linzess.  Discussed trial of Trulance if symptoms continued.    08/21/2022 iron studies showed a ferritin low at 10 and otherwise normal.  B12 normal.  TSH normal.  CBC normal.    Today, the patient tells me that she has had a stomach ache for over 5 years now.  She eventually had surgery to "cut out some of my intestines", but tells me she continues with a stomach ache.  Tells me there seems to be no "happy medium" between her GI system.  She has been on Linzess 72 mcg daily for the past few months and feels like this gives her urgent diarrhea 2 hours after taking it.  She never feels completely emptied out and this causes her a lot of distress.  She has never tried any other prescription medicines.  Denies any upper GI symptoms.  Associated symptoms include bloating.     Denies  fever, chills, weight loss, blood in her stool or symptoms that awaken her from sleep.  Past Medical History:  Diagnosis Date   Anemia 12/12/2019   Arthralgia 01/14/2020   Arthritis    Asthma    Atherosclerotic heart disease of native coronary artery without angina pectoris 04/13/2014   CHF (congestive heart failure) (HCC)    Chronic bilateral low back pain with bilateral sciatica 01/13/2019   Chronic bladder pain 11/08/2014   Last Assessment & Plan:  Formatting of this note might be different from the original. Patient has chronic pain syndrome in general I think this is unfortunately worsening her pelvic pain and bladder pain her examination today was very reassuring I am advised her to use the estrogen cream I did start her on Elavil 10 milligrams at that time if she does tolerated will increase it to 25 milligrams.    Chronic headaches    Chronic idiopathic constipation 12/22/2014   Formatting of this note might be different from the original. Last Assessment & Plan:  For better bowel emptying please use either Citrucel / Benefiber start with 2 tablespoons daily and titrate up or down to effect.  Also please use glycerin suppositories as needed to assist in evacuation. Last Assessment & Plan:  Formatting of this note might be different from the original. For better bowel empt   Chronic obstructive pulmonary disease, unspecified (HCC) 03/18/2018   Class 1 obesity due to excess calories with serious comorbidity and body mass index (BMI) of 31.0 to 31.9  in adult 06/26/2020   Colon polyps    COPD (chronic obstructive pulmonary disease) (HCC)    Coronary artery disease    DDD (degenerative disc disease), cervical 01/13/2019   Depression 11/05/2019   Diabetic polyneuropathy associated with type 2 diabetes mellitus (HCC) 02/24/2019   Diverticulitis 05/20/2018   Diverticulitis of colon 03/18/2018   Family history of ischemic heart disease (IHD) 08/16/2013   Fibromyalgia affecting multiple  sites 01/14/2020   Gastro-esophageal reflux disease without esophagitis 01/13/2019   Glaucoma 11/08/2014   Hordeolum externum of left upper eyelid 03/13/2020   Hypertension 11/08/2014   IBS (irritable bowel syndrome)    Iron deficiency anemia 01/13/2019   Left renal mass 11/08/2019   Leukocytosis 05/28/2018   Low vitamin B12 level 03/13/2020   Low vitamin D level 12/12/2019   LV dysfunction 05/31/2019   Malignant essential hypertension 01/22/2016   Migraine, unspecified, not intractable, without status migrainosus 11/08/2014   Mixed hyperlipidemia 01/13/2019   Myocardial infarction (HCC)    Other premature beats 11/08/2014   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 Last Assessment & Plan:  For vaginal atrophy please place a pea size dab of Estrogen vaginal cream  into the vagina 3 times a week ( Monday, Wednesday, Friday) Last Assessment & Plan:  Formatting of this note might be different from the original. For vaginal atrophy please place a pea size dab of Estrogen vaginal cream  into the vagina     Past Surgical History:  Procedure Laterality Date   ABDOMINAL HYSTERECTOMY     BIV ICD INSERTION CRT-D N/A 12/31/2020   Procedure: BIV ICD INSERTION CRT-D;  Surgeon: Marinus Maw, MD;  Location: [facility] INVASIVE CV LAB;  Service: Cardiovascular;  Laterality: N/A;   BLEPHAROPLASTY Bilateral    COLON SURGERY     CORONARY ANGIOPLASTY WITH STENT PLACEMENT  2020   X3   CORONARY STENT INTERVENTION N/A 08/03/2020   Procedure: CORONARY STENT INTERVENTION;  Surgeon: Tonny Bollman, MD;  Location: [facility] INVASIVE CV LAB;  Service:  Cardiovascular;  Laterality: N/A;   INTRAVASCULAR PRESSURE WIRE/FFR STUDY N/A 08/03/2020   Procedure: INTRAVASCULAR PRESSURE WIRE/FFR STUDY;  Surgeon: Tonny Bollman, MD;  Location[facility] INVASIVE CV LAB;  Service: Cardiovascular;  Laterality: N/A;   LEFT HEART CATH AND CORONARY ANGIOGRAPHY N/A 08/03/2020   Procedure: LEFT HEART CATH AND CORONARY ANGIOGRAPHY;  Surgeon: Tonny Bollman, MD;  Location: [facility] INVASIVE CV LAB;  Service: Cardiovascular;  Laterality: N/A;   REFRACTIVE SURGERY Left    [place]    Current Outpatient Medications  Medication Sig Dispense Refill   acetaminophen (TYLENOL) 325 MG tablet Take 2 tablets by mouth every 6 (six) hours as needed for pain.     ACETAMINOPHEN-BUTALBITAL 50-325 MG TABS Take 1 tablet by mouth every 4 (four) hours as needed for severe pain.     albuterol (PROVENTIL) (2.5 MG/3ML) 0.083% nebulizer solution INL CONTENTS OF 1 VIAL VIA NEBULIZER Q 4 H PRF WHEEZING OR COUGH     aspirin 81 MG EC tablet 81 mg daily.     baclofen (LIORESAL) 10 MG tablet Take 10 mg by mouth 3 (three) times daily.  carvedilol (COREG) 3.125 MG tablet TAKE 1 TABLET(3.125 MG) BY MOUTH TWICE DAILY WITH A MEAL 180 tablet 3   clopidogrel (PLAVIX) 75 MG tablet TAKE 1 TABLET(75 MG) BY MOUTH DAILY 90 tablet 3   diclofenac Sodium (VOLTAREN) 1 % GEL Apply 2 g topically 2 (two) times daily as needed (pain).     dicyclomine (BENTYL) 10 MG capsule TAKE 1 CAPSULE(10 MG) BY MOUTH IN THE MORNING AND AT BEDTIME 60 capsule 0   famotidine (PEPCID) 40 MG tablet TAKE 1 TABLET(40 MG) BY MOUTH TWICE DAILY 60 tablet 2   FARXIGA 10 MG TABS tablet TAKE 1 TABLET(10 MG) BY MOUTH DAILY BEFORE BREAKFAST 30 tablet 12   ferrous sulfate 324 MG TBEC Take 324 mg by mouth daily with breakfast.     fluticasone (FLONASE) 50 MCG/ACT nasal spray Place 1 spray into both nostrils as needed for allergies or rhinitis.     folic acid (FOLVITE) 1 MG tablet Take 1 mg by mouth daily.     glycerin adult 2 g suppository Place 1  suppository rectally as needed for constipation. Apply after bowel movements. 12 suppository 0   insulin lispro (HUMALOG) 100 UNIT/ML KwikPen Inject 16 Units into the skin 3 (three) times daily. Administer 16 units three times daily before a meal per sliding scale     ipratropium (ATROVENT) 0.03 % nasal spray Place 2 sprays into the nose in the morning, at noon, and at bedtime.     isosorbide mononitrate (IMDUR) 30 MG 24 hr tablet Take 15 mg by mouth daily.     lidocaine (LIDODERM) 5 % Place 1 patch onto the skin daily. Remove & Discard patch within 12 hours or as directed by MD 5 patch 0   LINZESS 72 MCG capsule Take 72 mcg by mouth every other day.     loratadine (CLARITIN) 10 MG tablet Take 10 mg by mouth daily.     nitroGLYCERIN (NITROSTAT) 0.4 MG SL tablet Place 1 tablet (0.4 mg total) under the tongue every 5 (five) minutes as needed for chest pain. 25 tablet 3   nortriptyline (PAMELOR) 10 MG capsule Take 10 mg by mouth at bedtime.     nystatin (MYCOSTATIN) 100000 UNIT/ML suspension Take 5 mLs by mouth as needed.     pantoprazole (PROTONIX) 40 MG tablet TAKE 1 TABLET(40 MG) BY MOUTH DAILY 90 tablet 3   pregabalin (LYRICA) 50 MG capsule Take 50 mg by mouth 3 (three) times daily.     ranolazine (RANEXA) 500 MG 12 hr tablet Take 1 tablet (500 mg total) by mouth 2 (two) times daily. 180 tablet 3   rosuvastatin (CRESTOR) 40 MG tablet Take 40 mg by mouth daily.     sacubitril-valsartan (ENTRESTO) 24-26 MG Take 1 tablet by mouth 2 (two) times daily. 60 tablet 4   torsemide (DEMADEX) 20 MG tablet Take twice on Tuesday and Thursday take once on other days 180 tablet 3   TRESIBA FLEXTOUCH 200 UNIT/ML FlexTouch Pen Inject 40 Units into the skin 2 (two) times daily. 40 units in the morning and up to 45 units at night     TRINTELLIX 5 MG TABS tablet Take 5 mg by mouth at bedtime.     valACYclovir (VALTREX) 1000 MG tablet Take 1,000 mg by mouth 2 (two) times daily as needed (flares).     Vericiguat  (VERQUVO) 5 MG TABS Take 1 tablet (5 mg total) by mouth daily. 90 tablet 3   vitamin B-12 (CYANOCOBALAMIN) 1000 MCG tablet Take 1,000  mcg by mouth daily.     Vitamin D, Ergocalciferol, (DRISDOL) 1.25 MG (50000 UNIT) CAPS capsule Take 50,000 Units by mouth once a week. sundays     No current facility-administered medications for this visit.    Allergies as of 10/14/2022 - Review Complete 09/17/2022  Allergen Reaction Noted   Bee venom Anaphylaxis 02/01/2018   Sumatriptan Other (See Comments) and Shortness Of Breath 11/02/2020   Tramadol Hives 05/06/2019   Amoxicillin-pot clavulanate Diarrhea, Nausea And Vomiting, and Other (See Comments) 11/08/2014   Oxycodone Itching and Hives 11/08/2014   Buprenorphine hcl  04/19/2012   Clarithromycin  04/19/2012   Duloxetine hcl  04/19/2012   Hydrocodone  04/19/2012   Lactose  05/26/2018   Liraglutide  04/19/2012   Tramadol hcl  04/19/2012    Family History  Problem Relation Age of Onset   Lung cancer Mother    Coronary artery disease Mother    Diabetes Mother    Glaucoma Mother    Hypertension Mother    Colon polyps Mother    Migraines Father    Heart disease Father    Diabetes Maternal Grandmother    Diabetes Daughter    Migraines Son    Hypertension Brother     Social History   Socioeconomic History   Marital status: Significant Other    Spouse name: Not on file   Number of children: 3   Years of education: Not on file   Highest education level: Not on file  Occupational History   Occupation: retired  Tobacco Use   Smoking status: Former    Types: Cigarettes    Quit date: 2021    Years since quitting: 2.8   Smokeless tobacco: Never  Vaping Use   Vaping Use: Never used  Substance and Sexual Activity   Alcohol use: Never   Drug use: Never   Sexual activity: Not on file  Other Topics Concern   Not on file  Social History Narrative   Not on file   Social Determinants of Health   Financial Resource Strain: Not on  file  Food Insecurity: Not on file  Transportation Needs: Not on file  Physical Activity: Not on file  Stress: Not on file  Social Connections: Not on file  Intimate Partner Violence: Not on file    Review of Systems:    Constitutional: No weight loss, fever or chills Cardiovascular: No chest pain   Respiratory: No SOB  Gastrointestinal: See HPI and otherwise negative   Physical Exam:  Vital signs: BP 100/60   Pulse 83   Ht 5\' 5"  (1.651 m)   Wt 202 lb 6 oz (91.8 kg)   BMI 33.68 kg/m    Constitutional:   Pleasant AA female appears to be in NAD, Well developed, Well nourished, alert and cooperative Respiratory: Respirations even and unlabored. Lungs clear to auscultation bilaterally.   No wheezes, crackles, or rhonchi.  Cardiovascular: Normal S1, S2. No MRG. Regular rate and rhythm. No peripheral edema, cyanosis or pallor.  Gastrointestinal:  Soft, mild distension, nontender. No rebound or guarding. Decreased BS all four quadrants, No appreciable masses or hepatomegaly. Rectal:  Not performed.  Psychiatric: Oriented to person, place and time. Demonstrates good judgement and reason without abnormal affect or behaviors.  RELEVANT LABS AND IMAGING: CBC    Component Value Date/Time   WBC 7.0 08/21/2022 0000   WBC 7.9 12/30/2020 0021   RBC 4.07 08/21/2022 0000   HGB 12.0 08/21/2022 0000   HCT 36 08/21/2022 0000  PLT 289 08/21/2022 0000   MCV 86 02/14/2021 0000   MCH 27.0 12/30/2020 0021   MCHC 29.9 (L) 12/30/2020 0021   RDW 15.4 12/30/2020 0021    CMP     Component Value Date/Time   NA 142 02/13/2022 0000   K 3.9 02/13/2022 0000   CL 107 02/13/2022 0000   CO2 29 (A) 02/13/2022 0000   GLUCOSE 222 (H) 01/21/2022 1439   GLUCOSE 252 (H) 12/30/2020 0021   BUN 15 02/13/2022 0000   CREATININE 1.0 02/13/2022 0000   CREATININE 1.21 (H) 01/21/2022 1439   CALCIUM 8.8 02/13/2022 0000   PROT 6.4 (L) 12/27/2020 0259   ALBUMIN 4.0 02/13/2022 0000   AST 25 02/13/2022 0000    ALT 15 02/13/2022 0000   ALKPHOS 74 02/13/2022 0000   BILITOT 0.5 12/27/2020 0259   GFRNONAA 39 (L) 01/16/2021 1226   GFRNONAA 51 (L) 12/30/2020 0021   GFRAA 45 (L) 01/16/2021 1226    Assessment: 1.  Chronic constipation: History of prior hemicolectomy and issues with constipation since, Linzess 72 mcg is too strong for the patient with urgent loose watery stools 2.  History of small bowel obstruction with history of partial colectomy and hysterectomy in the past 3.  Significant cardiac history: CAD status post stents on Plavix, CHF with AICD and chronic angina  Plan: 1.  Exam is not suspicious for a bowel obstruction at this point.  We will try switching up her medications.  Stop Linzess.  Start Amitiza 8 mcg twice daily with food.  Prescribed #60 with 3 refills. 2.  Patient will call and let me know how she is doing in the next 3 to 4 weeks.  If this is not working for her we can trial something else or increase the dosage/decrease the dosage. 3.  Patient to follow in clinic with Dr. Lavon Paganini in 3 to 4 months.  Hyacinth Meeker, PA-C [facility] 10/14/2022, 11:16 AM  Cc: Leane Call, PA-C

## 2022-10-14 NOTE — Patient Instructions (Signed)
_______________________________________________________  If you are age 72 or older, your body mass index should be between 23-30. Your Body mass index is 33.68 kg/m. If this is out of the aforementioned range listed, please consider follow up with your Primary Care Provider.  If you are age 92 or younger, your body mass index should be between 19-25. Your Body mass index is 33.68 kg/m. If this is out of the aformentioned range listed, please consider follow up with your Primary Care Provider.   ________________________________________________________  The [facility] providers would like to encourage you to use [facility] to communicate with providers for non-urgent requests or questions.  Due to long hold times on the telephone, sending your provider a message by [facility] may be a faster and more efficient way to get a response.  Please allow 48 business hours for a response.  Please remember that this is for non-urgent requests.  _______________________________________________________  We have sent the following medications to your pharmacy for you to pick up at your convenience:  Amitiza  Discontinue the Linzess.  Try the Amitiza for 3 weeks and then let Victorino Dike know how you are doing.

## 2022-10-29 ENCOUNTER — Telehealth: Payer: Self-pay | Admitting: Cardiology

## 2022-10-29 NOTE — Telephone Encounter (Signed)
Attempted to call patient, left message for patient to call back to office.   

## 2022-10-29 NOTE — Telephone Encounter (Signed)
    Primary Cardiologist: Thomasene Ripple, DO  Chart reviewed as part of pre-operative protocol coverage. Simple dental extractions are considered low risk procedures per guidelines and generally do not require any specific cardiac clearance. It is also generally accepted that for simple extractions and dental cleanings, there is no need to interrupt blood thinner therapy.   SBE prophylaxis is not required for the patient.  I will route this recommendation to the requesting party via Epic fax function and remove from pre-op pool.  Please call with questions.  Ronney Asters, NP 10/29/2022, 2:59 PM

## 2022-10-29 NOTE — Telephone Encounter (Signed)
Preoperative team, please contact requesting office and let them know that we are not able to provide blanket coverage.  Please ask for specific details surrounding upcoming procedure.  Once we know details surrounding procedure we will be able to provide recommendations from a cardiac standpoint.  Thank you for your help.  Thomasene Ripple. Ajai Harville NP-C     10/29/2022, 1:11 PM [facility] [address] Office [phone] Fax [phone]

## 2022-10-29 NOTE — Telephone Encounter (Signed)
I s/w the dental office. I explained that we cannot provide a blanket type clearance due to Plavix. I confirmed the procedure tomorrow will be for a dental cleaning (scaling and root planning).   It was stated that the pt will also have a crown prep 11/12/22 and fillings 12/2022. I did explain that we will need a request for each procedure to be done due to unable to hold Plavix longer than necessary.  I will update the pre op provider to review for clearance for dental cleaning as stated above.

## 2022-10-29 NOTE — Telephone Encounter (Signed)
Patient is calling to follow-up on her application to [employer] for Vericiguat (VERQUVO) 5 MG TABS.

## 2022-10-29 NOTE — Telephone Encounter (Signed)
   Pre-operative Risk Assessment    Patient Name: Karen Warner  DOB: 1951/02/24 MRN: [id number]     Request for Surgical Clearance    Procedure:   Dental Cleaning 11/30 & Fillings 01/24  Date of Surgery:  Clearance 10/30/22 & 12/24/21                                 Surgeon:  Phyllis Ginger - Hygienist for cleaning 11/30 Dr. Allena Katz for fillings 1/24  Surgeon's Group or Practice Name:  [facility]  Phone number:  [phone] ext [phone]  Fax number:  [phone]   Type of Clearance Requested:   - Medical  - Pharmacy:  Hold Clopidogrel (Plavix)     Type of Anesthesia:  Local    Additional requests/questions:   Pt has multiple things needing done; cleaning, fillings and extractions at some point. They would also like to know if okay with pacemaker.   Signed, April Henson   10/29/2022, 12:35 PM

## 2022-11-03 NOTE — Telephone Encounter (Signed)
Pt is calling to get an update on this. Requesting call back as soon as possible.

## 2022-11-04 NOTE — Telephone Encounter (Signed)
Looks like Raynelle Fanning may know what's going on with this

## 2022-11-05 NOTE — Telephone Encounter (Signed)
Spoke with the patient and she is aware that her [facility] assistant paperwork is at the front desk for her to pick up. She gave a verbal understanding.

## 2022-11-05 NOTE — Telephone Encounter (Signed)
Hey! I am not aware of what is going on with this paperwork. Last telephone I stated: Hey, I called this patient and she just needs renewal forms. She states she already has been approved but with the new year, she states she can pick up new forms from the office.   Thanks!    I sent to covering MA.   Thanks!

## 2022-11-05 NOTE — Telephone Encounter (Signed)
Pt is calling back requesting to speak to Palmer, CMA in regards to paperwork. She states she was never told to go to office for forms.  Transferred.

## 2022-11-07 NOTE — Telephone Encounter (Signed)
Patient has received her paperwork.

## 2022-11-11 ENCOUNTER — Other Ambulatory Visit: Payer: Self-pay | Admitting: Gastroenterology

## 2022-11-12 ENCOUNTER — Other Ambulatory Visit: Payer: Self-pay | Admitting: Gastroenterology

## 2022-11-27 ENCOUNTER — Telehealth: Payer: Self-pay

## 2022-11-27 NOTE — Telephone Encounter (Signed)
Patient is following-up on her [employer] paperwork for assistance with her medication.  Patient would like to know if the application was faxed.

## 2022-11-27 NOTE — Telephone Encounter (Signed)
Called and advised patient that her [employer] paperwork has been signed and faxed successfully. She gave a verbal understanding. I still have the copy and the fax confirmation

## 2022-12-14 ENCOUNTER — Other Ambulatory Visit: Payer: Self-pay | Admitting: Gastroenterology

## 2022-12-21 NOTE — Progress Notes (Incomplete)
[facility]  [address] [place],  [place]  [address] [phone]  Clinic Day:  08/21/2022  Referring physician: Maggie Schwalbe, PA-C  HISTORY OF PRESENT ILLNESS:  The patient is a 72 y.o. 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  Due to recent labs showing a low ferritin level, she recently received another course of IV iron.  She comes in today to reassess her hemoglobin and iron levels.    In the past, IV Feraheme was effective in replenishing her iron stores and improving her hemoglobin.  She comes in today to reassess her anemia.  Since her last visit, the patient has been doing fairly well.  She denies having increased fatigue or any overt forms of blood loss which concern her for progressive anemia.  However, she has begun to have more ice cravings recently.  PHYSICAL EXAM:  There were no vitals taken for this visit. Wt Readings from Last 3 Encounters:  10/14/22 202 lb 6 oz (91.8 kg)  09/17/22 199 lb 4 oz (90.4 kg)  09/10/22 199 lb 1.3 oz (90.3 kg)   There is no height or weight on file to calculate BMI. Performance status (ECOG): 1 - Symptomatic but completely ambulatory Physical Exam Constitutional:      General: She is not in acute distress.    Appearance: Normal appearance. She is normal weight.  HENT:     Head: Normocephalic and atraumatic.  Eyes:     General: No scleral icterus.    Extraocular Movements: Extraocular movements intact.     Conjunctiva/sclera: Conjunctivae normal.     Pupils: Pupils are equal, round, and reactive to light.  Cardiovascular:     Rate and Rhythm: Normal rate and regular rhythm.     Pulses: Normal pulses.     Heart sounds: Normal heart sounds. No murmur heard.    No friction rub. No gallop.  Pulmonary:     Effort: Pulmonary effort is normal. No respiratory distress.     Breath sounds: Normal breath sounds.  Abdominal:     General: Bowel sounds are  normal. There is no distension.     Palpations: Abdomen is soft. There is no hepatomegaly, splenomegaly or mass.     Tenderness: There is no abdominal tenderness.  Musculoskeletal:        General: Normal range of motion.     Cervical back: Normal range of motion and neck supple.     Right lower leg: No edema.     Left lower leg: No edema.  Lymphadenopathy:     Cervical: No cervical adenopathy.  Skin:    General: Skin is warm and dry.  Neurological:     General: No focal deficit present.     Mental Status: She is alert and oriented to person, place, and time. Mental status is at baseline.  Psychiatric:        Mood and Affect: Mood normal.        Behavior: Behavior normal.        Thought Content: Thought content normal.        Judgment: Judgment normal.    LABS:      Latest Ref Rng & Units 08/21/2022   12:00 AM 02/13/2022   12:00 AM 08/16/2021   12:00 AM  CBC  WBC  7.0     6.5     6.4   Hemoglobin 12.0 - 16.0 12.0     12.5  12.8   Hematocrit 36 - 46 36     40     38   Platelets 150 - 400 K/uL 289     276     250      This result is from an external source.       Latest Ref Rng & Units 02/13/2022   12:00 AM 01/21/2022    2:39 PM 09/17/2021   12:40 PM  CMP  Glucose 70 - 99 mg/dL  222  132   BUN 4 - 21 15     15  15    Creatinine 0.5 - 1.1 1.0     1.21  1.10   Sodium 137 - 147 142     142  144   Potassium 3.5 - 5.1 mEq/L 3.9     4.1  4.6   Chloride 99 - 108 107     103  105   CO2 13 - 22 29     25  23    Calcium 8.7 - 10.7 8.8     9.2  9.7   Alkaline Phos 25 - 125 74        AST 13 - 35 25        ALT 7 - 35 U/L 15           This result is from an external source.     Latest Reference Range & Units 08/21/22 13:55  Iron 28 - 170 ug/dL 61  UIBC ug/dL 302  TIBC 250 - 450 ug/dL 363  Saturation Ratios 10.4 - 31.8 % 17  Ferritin 11 - 307 ng/mL 10 (L)  (L): Data is abnormally low  ASSESSMENT & PLAN:  Assessment/Plan:  A 72 y.o. female with anemia secondary to iron  deficiency and previous renal insufficiency.  I am pleased as her hemoglobin remains at an ideal level.  However, her iron parameters show a low ferritin level, which suggests her iron stores are low.  Based upon this, I will arrange for her to receive IV over these forthcoming weeks.  I will see her back in 4 months for repeat clinical assessment.  The patient understands all the plans discussed today and is in agreement with them.    Karen Warner Macarthur Critchley, MD

## 2022-12-22 ENCOUNTER — Inpatient Hospital Stay: Payer: Medicare Other

## 2022-12-22 ENCOUNTER — Ambulatory Visit: Payer: Medicare Other | Admitting: Oncology

## 2022-12-24 ENCOUNTER — Telehealth: Payer: Self-pay | Admitting: Cardiology

## 2022-12-24 NOTE — Telephone Encounter (Signed)
Left message to make aware will follow up with primary nurse.

## 2022-12-24 NOTE — Progress Notes (Signed)
[facility]  [address] [place],  [place]  [address] [phone]  Clinic Day:  12/25/2022  Referring physician: Leane Call, PA-C  HISTORY OF PRESENT ILLNESS:  The patient is a 72 y.o. 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  She comes in today to reassess her iron and hemoglobin levels after receiving IV iron in October 2023.   Since receiving her IV iron, the patient has been doing better.  She denies having increased fatigue or any overt forms of blood loss which concern her for progressive anemia.  Her ice cravings have dissipated.  PHYSICAL EXAM:  Blood pressure 121/73, pulse 93, temperature 97.7 F (36.5 C), resp. rate 16, height 5\' 5"  (1.651 m), weight 204 lb 6.4 oz (92.7 kg), SpO2 98 %. Wt Readings from Last 3 Encounters:  02/26/23 205 lb 9.6 oz (93.3 kg)  12/25/22 204 lb 6.4 oz (92.7 kg)  10/14/22 202 lb 6 oz (91.8 kg)   Body mass index is 34.01 kg/m. Performance status (ECOG): 1 - Symptomatic but completely ambulatory Physical Exam Constitutional:      General: She is not in acute distress.    Appearance: Normal appearance. She is normal weight.  HENT:     Head: Normocephalic and atraumatic.  Eyes:     General: No scleral icterus.    Extraocular Movements: Extraocular movements intact.     Conjunctiva/sclera: Conjunctivae normal.     Pupils: Pupils are equal, round, and reactive to light.  Cardiovascular:     Rate and Rhythm: Normal rate and regular rhythm.     Pulses: Normal pulses.     Heart sounds: Normal heart sounds. No murmur heard.    No friction rub. No gallop.  Pulmonary:     Effort: Pulmonary effort is normal. No respiratory distress.     Breath sounds: Normal breath sounds.  Abdominal:     General: Bowel sounds are normal. There is no distension.     Palpations: Abdomen is soft. There is no hepatomegaly, splenomegaly or mass.     Tenderness: There is no  abdominal tenderness.  Musculoskeletal:        General: Normal range of motion.     Cervical back: Normal range of motion and neck supple.     Right lower leg: No edema.     Left lower leg: No edema.  Lymphadenopathy:     Cervical: No cervical adenopathy.  Skin:    General: Skin is warm and dry.  Neurological:     General: No focal deficit present.     Mental Status: She is alert and oriented to person, place, and time. Mental status is at baseline.  Psychiatric:        Mood and Affect: Mood normal.        Behavior: Behavior normal.        Thought Content: Thought content normal.        Judgment: Judgment normal.     LABS:        Latest Ref Rng & Units 12/25/2022   12:00 AM 08/21/2022   12:00 AM 02/13/2022   12:00 AM  CBC  WBC  6.6     7.0     6.5      Hemoglobin 12.0 - 16.0 13.3     12.0     12.5      Hematocrit 36 - 46 40     36     40  Platelets 150 - 400 K/uL 281     289     276         This result is from an external source.    Latest Reference Range & Units 12/25/22 15:25  Iron 28 - 170 ug/dL 76  UIBC ug/dL 161  TIBC 096 - 045 ug/dL 409  Saturation Ratios 10.4 - 31.8 % 24  Ferritin 11 - 307 ng/mL 45    ASSESSMENT & PLAN:  Assessment/Plan:  A 72 y.o. female with anemia secondary to iron deficiency and previous renal insufficiency.  I am pleased as her hemoglobin and iron levels have both improved since receiving her IV iron.  Clinically, she is doing well.  I will see her back in 6 months for repeat clinical assessment.  The patient understands all the plans discussed today and is in agreement with them.    Khrystyna Schwalm Kirby Funk, MD

## 2022-12-24 NOTE — Telephone Encounter (Signed)
Patient is calling about paperwork she dropped off in December for [employer].  She states she got a letter stating that an application and a valid prescription needs to be submitted from the provider.

## 2022-12-25 ENCOUNTER — Inpatient Hospital Stay (INDEPENDENT_AMBULATORY_CARE_PROVIDER_SITE_OTHER): Payer: Medicare Other | Admitting: Oncology

## 2022-12-25 ENCOUNTER — Telehealth: Payer: Self-pay | Admitting: Cardiology

## 2022-12-25 ENCOUNTER — Telehealth: Payer: Self-pay | Admitting: Oncology

## 2022-12-25 ENCOUNTER — Other Ambulatory Visit: Payer: Self-pay | Admitting: Oncology

## 2022-12-25 ENCOUNTER — Inpatient Hospital Stay: Payer: Medicare Other | Attending: Oncology

## 2022-12-25 VITALS — BP 121/73 | HR 93 | Temp 97.7°F | Resp 16 | Ht 65.0 in | Wt 204.4 lb

## 2022-12-25 DIAGNOSIS — D508 Other iron deficiency anemias: Secondary | ICD-10-CM | POA: Diagnosis not present

## 2022-12-25 DIAGNOSIS — D631 Anemia in chronic kidney disease: Secondary | ICD-10-CM | POA: Diagnosis present

## 2022-12-25 DIAGNOSIS — N189 Chronic kidney disease, unspecified: Secondary | ICD-10-CM | POA: Diagnosis present

## 2022-12-25 LAB — CBC AND DIFFERENTIAL
HCT: 40 (ref 36–46)
Hemoglobin: 13.3 (ref 12.0–16.0)
Neutrophils Absolute: 3.7
Platelets: 281 10*3/uL (ref 150–400)
WBC: 6.6

## 2022-12-25 LAB — HEPATIC FUNCTION PANEL
ALT: 17 U/L (ref 7–35)
AST: 25 (ref 13–35)
Alkaline Phosphatase: 76 (ref 25–125)
Bilirubin, Total: 0.3

## 2022-12-25 LAB — BASIC METABOLIC PANEL
BUN: 17 (ref 4–21)
CO2: 30 — AB (ref 13–22)
Chloride: 107 (ref 99–108)
Creatinine: 1.3 — AB (ref 0.5–1.1)
Glucose: 108
Potassium: 3.9 mEq/L (ref 3.5–5.1)
Sodium: 142 (ref 137–147)

## 2022-12-25 LAB — IRON AND TIBC
Iron: 76 ug/dL (ref 28–170)
Saturation Ratios: 24 % (ref 10.4–31.8)
TIBC: 315 ug/dL (ref 250–450)
UIBC: 239 ug/dL

## 2022-12-25 LAB — CBC: RBC: 4.33 (ref 3.87–5.11)

## 2022-12-25 LAB — COMPREHENSIVE METABOLIC PANEL
Albumin: 4 (ref 3.5–5.0)
Calcium: 9.4 (ref 8.7–10.7)

## 2022-12-25 LAB — FERRITIN: Ferritin: 45 ng/mL (ref 11–307)

## 2022-12-25 MED ORDER — DAPAGLIFLOZIN PROPANEDIOL 10 MG PO TABS: ORAL_TABLET | ORAL | 1 refills | Status: DC

## 2022-12-25 NOTE — Telephone Encounter (Signed)
*  STAT* If patient is at the pharmacy, call can be transferred to refill team.   1. Which medications need to be refilled? (please list name of each medication and dose if known) FARXIGA 10 MG TABS tablet   2. Which pharmacy/location (including street and city if local pharmacy) is medication to be sent to? [facility] [place], [place]   3. Do they need a 30 day or 90 day supply? 30 day   Patient has two pills left.

## 2022-12-25 NOTE — Telephone Encounter (Signed)
12/25/22 Next appt scheduled and confirmed with patient 

## 2022-12-26 NOTE — Telephone Encounter (Signed)
Forward to Dr Terrial Rhodes  nurse

## 2022-12-26 NOTE — Telephone Encounter (Signed)
Patient stated she received a letter on Wednesday from [employer] telling her that she will need the application and a prescription for the Vericiguat (VERQUVO) 5 MG TABS sent to them.   Patient would like a call back from [employer].

## 2022-12-31 ENCOUNTER — Other Ambulatory Visit: Payer: Self-pay

## 2022-12-31 MED ORDER — VERQUVO 5 MG PO TABS: 5.0000 mg | ORAL_TABLET | Freq: Every day | ORAL | 3 refills | Status: DC

## 2022-12-31 NOTE — Telephone Encounter (Signed)
Patient is following up, again requesting a call back from RN.

## 2022-12-31 NOTE — Telephone Encounter (Signed)
Prescription printed to fax to [employer] on behalf of patient.

## 2023-01-01 NOTE — Telephone Encounter (Signed)
Called pt, Spoke with her and let her know the fax was sent but the prescription was not faxed. Will re-fax paperwork with prescription today. Pt wants to know if she can come off her blood thinners to have some orthopedic care. "They will not do anything because I am on these blood thinners. No injections, just nothing invasive."

## 2023-01-01 NOTE — Telephone Encounter (Signed)
  Pt is calling back to follow up  

## 2023-01-01 NOTE — Telephone Encounter (Signed)
Patient stated she turned in PAP in December for verquvo and has not heard back from Dr. Terrial Rhodes nurse. Patient would like Dr. Terrial Rhodes nurse to call her.

## 2023-01-02 ENCOUNTER — Ambulatory Visit: Payer: Medicare Other

## 2023-01-02 DIAGNOSIS — I255 Ischemic cardiomyopathy: Secondary | ICD-10-CM

## 2023-01-02 LAB — CUP PACEART REMOTE DEVICE CHECK
Battery Remaining Longevity: 96 mo
Battery Remaining Percentage: 100 %
Brady Statistic RA Percent Paced: 0 %
Brady Statistic RV Percent Paced: 100 %
Date Time Interrogation Session: 20240202031200
HighPow Impedance: 77 Ohm
Implantable Lead Connection Status: 753985
Implantable Lead Connection Status: 753985
Implantable Lead Connection Status: 753985
Implantable Lead Implant Date: 20220131
Implantable Lead Implant Date: 20220131
Implantable Lead Implant Date: 20220131
Implantable Lead Location: 753858
Implantable Lead Location: 753859
Implantable Lead Location: 753860
Implantable Lead Model: 137
Implantable Lead Model: 3830
Implantable Lead Model: 7840
Implantable Lead Serial Number: 1047234
Implantable Lead Serial Number: 301179
Implantable Pulse Generator Implant Date: 20220131
Lead Channel Impedance Value: 532 Ohm
Lead Channel Impedance Value: 557 Ohm
Lead Channel Impedance Value: 717 Ohm
Lead Channel Setting Pacing Amplitude: 0.1 V
Lead Channel Setting Pacing Amplitude: 3.5 V
Lead Channel Setting Pacing Amplitude: 3.5 V
Lead Channel Setting Pacing Pulse Width: 0.1 ms
Lead Channel Setting Pacing Pulse Width: 0.4 ms
Lead Channel Setting Sensing Sensitivity: 0.5 mV
Lead Channel Setting Sensing Sensitivity: 1 mV
Pulse Gen Serial Number: 149649
Zone Setting Status: 755011

## 2023-01-09 NOTE — Telephone Encounter (Addendum)
Called pt to let her know I faxed the paperwork the other day when we spoke and I have not heard anything back at this time. She verbalized understanding. Pt states she lost the prescription she just had filled and can not find it. Pt wants to know if there will be an issue since she does not have the medication and probably will not for awhile. She wants to know if there is an alternative. Will get message to Dr. Harriet Masson for review.

## 2023-01-09 NOTE — Telephone Encounter (Signed)
Reached out to [place] office to see if they have samples. They do, samples will be on hold for pt. Called pt to let her know. She thanked me for reaching out to them.

## 2023-01-09 NOTE — Telephone Encounter (Signed)
Patient is calling to check on the status of the [employer] application. She is also wanting to know if there are any samples available for verquvo due to being out. Please advise.

## 2023-01-09 NOTE — Telephone Encounter (Signed)
Pt informed her message regarding [employer] application will be sent to Dr. Terrial Rhodes nurse. Advised pt that our clinic does not have Verquvo samples.

## 2023-01-14 ENCOUNTER — Encounter: Payer: Self-pay | Admitting: Oncology

## 2023-01-20 NOTE — Progress Notes (Signed)
Remote ICD transmission.   

## 2023-01-29 DIAGNOSIS — R079 Chest pain, unspecified: Secondary | ICD-10-CM

## 2023-01-30 ENCOUNTER — Telehealth: Payer: Self-pay | Admitting: Cardiology

## 2023-01-30 NOTE — Telephone Encounter (Signed)
Patient is calling to talk with Dr. Harriet Masson or nurse. Please call back

## 2023-01-30 NOTE — Telephone Encounter (Signed)
Called patient, she states she wants to check in with [place], RN about paperwork to [employer].  Advised I would reach out.   She also states she needs a follow up with Dr.Tobb- I did look, but did not see any openings. Will reach out to RN to advise on appointment.  Thanks!

## 2023-02-06 ENCOUNTER — Other Ambulatory Visit: Payer: Self-pay | Admitting: Nurse Practitioner

## 2023-02-10 NOTE — Telephone Encounter (Signed)
Called pt. She states she has not heard anything from [employer]. Pt states she was in the hospital last week due to chest pains, after being in bed 2-3 days she went to th hospital. They did a stress test, no new developments. Pt's appt move to 3/28.

## 2023-02-26 ENCOUNTER — Encounter: Payer: Self-pay | Admitting: Cardiology

## 2023-02-26 ENCOUNTER — Ambulatory Visit: Payer: Medicare Other | Attending: Cardiology | Admitting: Cardiology

## 2023-02-26 VITALS — BP 110/64 | HR 79 | Ht 65.0 in | Wt 205.6 lb

## 2023-02-26 DIAGNOSIS — I2510 Atherosclerotic heart disease of native coronary artery without angina pectoris: Secondary | ICD-10-CM

## 2023-02-26 DIAGNOSIS — Z79899 Other long term (current) drug therapy: Secondary | ICD-10-CM | POA: Diagnosis present

## 2023-02-26 DIAGNOSIS — I519 Heart disease, unspecified: Secondary | ICD-10-CM

## 2023-02-26 DIAGNOSIS — Z01810 Encounter for preprocedural cardiovascular examination: Secondary | ICD-10-CM

## 2023-02-26 DIAGNOSIS — I739 Peripheral vascular disease, unspecified: Secondary | ICD-10-CM

## 2023-02-26 MED ORDER — TORSEMIDE 20 MG PO TABS: 20.0000 mg | ORAL_TABLET | Freq: Two times a day (BID) | ORAL | 3 refills | Status: DC

## 2023-02-26 NOTE — Patient Instructions (Signed)
Medication Instructions:   Start Torsemide 20 MG twice daily  *If you need a refill on your cardiac medications before your next appointment, please call your pharmacy*   Lab Work: Your physician recommends that you have lab work today:  CMET Mg2+   If you have labs (blood work) drawn today and your tests are completely normal, you will receive your results only by: Advance (if you have MyChart) OR A paper copy in the mail If you have any lab test that is abnormal or we need to change your treatment, we will call you to review the results.   Testing/Procedures: None ordered   Follow-Up: At [facility], you and your health needs are our priority.  As part of our continuing mission to provide you with exceptional heart care, we have created designated Provider Care Teams.  These Care Teams include your primary Cardiologist (physician) and Advanced Practice Providers (APPs -  Physician Assistants and Nurse Practitioners) who all work together to provide you with the care you need, when you need it.  We recommend signing up for the patient portal called "MyChart".  Sign up information is provided on this After Visit Summary.  MyChart is used to connect with patients for Virtual Visits (Telemedicine).  Patients are able to view lab/test results, encounter notes, upcoming appointments, etc.  Non-urgent messages can be sent to your provider as well.   To learn more about what you can do with MyChart, go to [email or web address].    Your next appointment:   14 week(s)  Provider:   Berniece Salines, DO

## 2023-02-26 NOTE — Progress Notes (Unsigned)
Cardiology Office Note:    Date:  03/03/2023   ID:  Karen Warner, DOB 1951-06-05, MRN [id number]  PCP:  Myrlene Broker, MD  Cardiologist:  Berniece Salines, DO  Electrophysiologist:  Cristopher Peru, MD   Referring MD: Myrlene Broker, MD   " I am doing ok"   History of Present Illness:    Karen Warner is a 72 y.o. female with a hx of coronary artery disease status post PCI, ischemic cardiomyopathy status post BiV pacemaker during her hospitalization in January 2022 presented for syncope episode, heart failure reduced ejection fraction recent EF 20 to 25% on her echo which was done in January 2022, sleep apnea is here today for follow-up visit.   I saw the patient most recently on January 16, 2021 at that time she had had a fall and was concerned about information on her device.  We reviewed her device check which was normal.  She did have some bilateral leg edema but was still at her dry weight.  Therefore her diuretics was not changed.   At her visit on January 16, 2021 we continued her torsemide which was every other day.  In terms of her ischemic cardiomyopathy we kept the Entresto, Coreg with no changes.  She was off Aldactone but her blood pressure did not tolerate starting this medication.    On March 15, 2021 she appeared to be doing well from a cardiovascular standpoint.  She was still waiting for her CPAP titration study.   I saw the patient on July 16, 2021 at that time she was post hospitalization from [facility] for heart failure exacerbation.  During that visit I started the patient on Verquvo 2.5mg  daily with the intention to titrate up to the maximum tolerable dose.  Continue her other guideline medical therapy.    I saw the patient September 17, 2021 At that time she has some increasing edema I increased her torsemide.    I saw the patient on January 21, 2022 at that time she was posthospitalization at [facility] where she  underwent a left heart catheterization with no indication for PCI.   I saw the patient on 02/19/2022 at that time she post hospitalization from [facility]. She was doing well. I did not change any medications.   I saw the patient on 06/20/2022 at that time she was stable from a cardiovascular standpoint.  No medication changes were made.  In the interim she has followed with EP with no changes.  Since I saw the patient she was hospitalized for CHF exacerbation.  Torsemide had been changed to 20 mg twice a day..  She is not happy with her recent weight gain but this is contributed due to her significant back pain and she has been hospitalized for CHF exacerbation.  No other specific complaints at this time.  She is hoping that she can be able to feel better with her back.  Be able to exercise more.  Past Medical History:  Diagnosis Date   Anemia 12/12/2019   Arthralgia 01/14/2020   Arthritis    Asthma    Atherosclerotic heart disease of native coronary artery without angina pectoris 04/13/2014   CHF (congestive heart failure) (HCC)    Chronic bilateral low back pain with bilateral sciatica 01/13/2019   Chronic bladder pain 11/08/2014   Last Assessment & Plan:  Formatting of this note might be different from the original. Patient has chronic pain syndrome in general I think  this is unfortunately worsening her pelvic pain and bladder pain her examination today was very reassuring I am advised her to use the estrogen cream I did start her on Elavil 10 milligrams at that time if she does tolerated will increase it to 25 milligrams.    Chronic headaches    Chronic idiopathic constipation 12/22/2014   Formatting of this note might be different from the original. Last Assessment & Plan:  For better bowel emptying please use either Citrucel / Benefiber start with 2 tablespoons daily and titrate up or down to effect.  Also please use glycerin suppositories as needed to assist in evacuation. Last  Assessment & Plan:  Formatting of this note might be different from the original. For better bowel empt   Chronic obstructive pulmonary disease, unspecified ([place]) 03/18/2018   Class 1 obesity due to excess calories with serious comorbidity and body mass index (BMI) of 31.0 to 31.9 in adult 06/26/2020   Colon polyps    COPD (chronic obstructive pulmonary disease) (HCC)    Coronary artery disease    DDD (degenerative disc disease), cervical 01/13/2019   Depression 11/05/2019   Diabetic polyneuropathy associated with type 2 diabetes mellitus ([place]) 02/24/2019   Diverticulitis 05/20/2018   Diverticulitis of colon 03/18/2018   Family history of ischemic heart disease (IHD) 08/16/2013   Fibromyalgia affecting multiple sites 01/14/2020   Gastro-esophageal reflux disease without esophagitis 01/13/2019   Glaucoma 11/08/2014   Hordeolum externum of left upper eyelid 03/13/2020   Hypertension 11/08/2014   IBS (irritable bowel syndrome)    Iron deficiency anemia 01/13/2019   Left renal mass 11/08/2019   Leukocytosis 05/28/2018   Low vitamin B12 level 03/13/2020   Low vitamin D level 12/12/2019   LV dysfunction 05/31/2019   Malignant essential hypertension 01/22/2016   Migraine, unspecified, not intractable, without status migrainosus 11/08/2014   Mixed hyperlipidemia 01/13/2019   Myocardial infarction ([place])    Other premature beats 11/08/2014   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 Last Assessment & Plan:  For vaginal atrophy please place a pea size dab of Estrogen vaginal cream  into the vagina 3 times a week ( Monday,  Wednesday, Friday) Last Assessment & Plan:  Formatting of this note might be different from the original. For vaginal atrophy please place a pea size dab of Estrogen vaginal cream  into the vagina     Past Surgical History:  Procedure Laterality Date   ABDOMINAL HYSTERECTOMY     BIV ICD INSERTION CRT-D N/A 12/31/2020   Procedure: BIV ICD INSERTION CRT-D;  Surgeon: Evans Lance, MD;  Location: [place] [facility];  Service: Cardiovascular;  Laterality: N/A;   BLEPHAROPLASTY Bilateral    COLON SURGERY     CORONARY ANGIOPLASTY WITH STENT PLACEMENT  2020   X3   CORONARY PRESSURE/FFR STUDY N/A 08/03/2020   Procedure: INTRAVASCULAR PRESSURE WIRE/FFR STUDY;  Surgeon: Sherren Mocha, MD;  Location: [place] [facility];  Service: Cardiovascular;  Laterality: N/A;   CORONARY STENT INTERVENTION N/A 08/03/2020   Procedure: CORONARY STENT INTERVENTION;  Surgeon: Sherren Mocha, MD;  Location: [place] [facility];  Service: Cardiovascular;  Laterality: N/A;  LEFT HEART CATH AND CORONARY ANGIOGRAPHY N/A 08/03/2020   Procedure: LEFT HEART CATH AND CORONARY ANGIOGRAPHY;  Surgeon: Sherren Mocha, MD;  Location: [place] [facility];  Service: Cardiovascular;  Laterality: N/A;   REFRACTIVE SURGERY Left    [place]    Current Medications: Current Meds  Medication Sig   acetaminophen (TYLENOL) 325 MG tablet Take 2 tablets by mouth every 6 (six) hours as needed for pain.   ACETAMINOPHEN-BUTALBITAL 50-325 MG TABS Take 1 tablet by mouth every 4 (four) hours as needed for severe pain.   albuterol (PROVENTIL) (2.5 MG/3ML) 0.083% nebulizer solution INL CONTENTS OF 1 VIAL VIA NEBULIZER Q 4 H PRF WHEEZING OR COUGH   aspirin 81 MG EC tablet 81 mg daily.   baclofen (LIORESAL) 10 MG tablet Take 10 mg by mouth 3 (three) times daily.   carvedilol (COREG) 3.125 MG tablet TAKE 1 TABLET(3.125 MG) BY MOUTH TWICE DAILY WITH A MEAL   clopidogrel (PLAVIX) 75 MG tablet TAKE 1 TABLET(75 MG) BY MOUTH DAILY   dapagliflozin  propanediol (FARXIGA) 10 MG TABS tablet TAKE 1 TABLET(10 MG) BY MOUTH DAILY BEFORE BREAKFAST   diclofenac Sodium (VOLTAREN) 1 % GEL Apply 2 g topically 2 (two) times daily as needed (pain).   dicyclomine (BENTYL) 10 MG capsule TAKE 1 CAPSULE(10 MG) BY MOUTH IN THE MORNING AND AT BEDTIME   famotidine (PEPCID) 40 MG tablet TAKE 1 TABLET(40 MG) BY MOUTH TWICE DAILY   ferrous sulfate 324 MG TBEC Take 324 mg by mouth daily with breakfast.   fluticasone (FLONASE) 50 MCG/ACT nasal spray Place 1 spray into both nostrils as needed for allergies or rhinitis.   folic acid (FOLVITE) 1 MG tablet Take 1 mg by mouth daily.   glycerin adult 2 g suppository Place 1 suppository rectally as needed for constipation. Apply after bowel movements.   insulin lispro (HUMALOG) 100 UNIT/ML KwikPen Inject 16 Units into the skin 3 (three) times daily. Administer 16 units three times daily before a meal per sliding scale   ipratropium (ATROVENT) 0.03 % nasal spray Place 2 sprays into the nose in the morning, at noon, and at bedtime.   isosorbide mononitrate (IMDUR) 30 MG 24 hr tablet Take 15 mg by mouth daily.   lidocaine (LIDODERM) 5 % Place 1 patch onto the skin daily. Remove & Discard patch within 12 hours or as directed by MD   LINZESS 72 MCG capsule Take 72 mcg by mouth every other day.   loratadine (CLARITIN) 10 MG tablet Take 10 mg by mouth daily.   lubiprostone (AMITIZA) 8 MCG capsule Take 1 capsule (8 mcg total) by mouth 2 (two) times daily with a meal.   nitroGLYCERIN (NITROSTAT) 0.4 MG SL tablet Place 1 tablet (0.4 mg total) under the tongue every 5 (five) minutes as needed for chest pain.   nortriptyline (PAMELOR) 10 MG capsule Take 10 mg by mouth at bedtime.   nystatin (MYCOSTATIN) 100000 UNIT/ML suspension Take 5 mLs by mouth as needed.   pantoprazole (PROTONIX) 40 MG tablet TAKE 1 TABLET(40 MG) BY MOUTH DAILY   pregabalin (LYRICA) 50 MG capsule Take 50 mg by mouth 3 (three) times daily.   ranolazine (RANEXA)  500 MG 12 hr tablet Take 1 tablet (500 mg total) by mouth 2 (two) times daily.   rosuvastatin (CRESTOR) 40 MG tablet Take 40 mg by mouth daily.   sacubitril-valsartan (ENTRESTO) 24-26 MG Take 1 tablet by mouth 2 (two) times daily.   torsemide (DEMADEX) 20 MG tablet Take 1  tablet (20 mg total) by mouth 2 (two) times daily.   TRESIBA FLEXTOUCH 200 UNIT/ML FlexTouch Pen Inject 40 Units into the skin 2 (two) times daily. 40 units in the morning and up to 45 units at night   TRINTELLIX 5 MG TABS tablet Take 5 mg by mouth at bedtime.   valACYclovir (VALTREX) 1000 MG tablet Take 1,000 mg by mouth 2 (two) times daily as needed (flares).   Vericiguat (VERQUVO) 5 MG TABS Take 1 tablet (5 mg total) by mouth daily.   vitamin B-12 (CYANOCOBALAMIN) 1000 MCG tablet Take 1,000 mcg by mouth daily.   Vitamin D, Ergocalciferol, (DRISDOL) 1.25 MG (50000 UNIT) CAPS capsule Take 50,000 Units by mouth once a week. sundays   [DISCONTINUED] torsemide (DEMADEX) 20 MG tablet Take twice on Tuesday and Thursday take once on other days     Allergies:   Bee venom, Sumatriptan, Tramadol, Amoxicillin-pot clavulanate, Oxycodone, Buprenorphine hcl, Clarithromycin, Duloxetine hcl, Hydrocodone, Lactose, Liraglutide, and Tramadol hcl   Social History   Socioeconomic History   Marital status: Significant Other    Spouse name: Not on file   Number of children: 3   Years of education: Not on file   Highest education level: Not on file  Occupational History   Occupation: retired  Tobacco Use   Smoking status: Former    Types: Cigarettes    Quit date: 2021    Years since quitting: 3.2   Smokeless tobacco: Never  Vaping Use   Vaping Use: Never used  Substance and Sexual Activity   Alcohol use: Never   Drug use: Never   Sexual activity: Not on file  Other Topics Concern   Not on file  Social History Narrative   Not on file   Social Determinants of Health   Financial Resource Strain: Not on file  Food Insecurity: Not  on file  Transportation Needs: Not on file  Physical Activity: Not on file  Stress: Not on file  Social Connections: Not on file     Family History: The patient's family history includes Colon polyps in her mother; Coronary artery disease in her mother; Diabetes in her daughter, maternal grandmother, and mother; Glaucoma in her mother; Heart disease in her father; Hypertension in her brother and mother; Lung cancer in her mother; Migraines in her father and son.  ROS:   Review of Systems  Constitution: Negative for decreased appetite, fever and weight gain.  HENT: Negative for congestion, ear discharge, hoarse voice and sore throat.   Eyes: Negative for discharge, redness, vision loss in right eye and visual halos.  Cardiovascular: Negative for chest pain, dyspnea on exertion, leg swelling, orthopnea and palpitations.  Respiratory: Negative for cough, hemoptysis, shortness of breath and snoring.   Endocrine: Negative for heat intolerance and polyphagia.  Hematologic/Lymphatic: Negative for bleeding problem. Does not bruise/bleed easily.  Skin: Negative for flushing, nail changes, rash and suspicious lesions.  Musculoskeletal: Negative for arthritis, joint pain, muscle cramps, myalgias, neck pain and stiffness.  Gastrointestinal: Negative for abdominal pain, bowel incontinence, diarrhea and excessive appetite.  Genitourinary: Negative for decreased libido, genital sores and incomplete emptying.  Neurological: Negative for brief paralysis, focal weakness, headaches and loss of balance.  Psychiatric/Behavioral: Negative for altered mental status, depression and suicidal ideas.  Allergic/Immunologic: Negative for HIV exposure and persistent infections.    EKGs/Labs/Other Studies Reviewed:    The following studies were reviewed today:   EKG:  The ekg ordered today demonstrates   Recent Labs: 12/25/2022: Hemoglobin 13.3; Platelets  281 02/26/2023: ALT 11; BUN 14; Creatinine, Ser 1.27;  Magnesium 2.3; Potassium 4.3; Sodium 144  Recent Lipid Panel No results found for: "CHOL", "TRIG", "HDL", "CHOLHDL", "VLDL", "LDLCALC", "LDLDIRECT"  Physical Exam:    VS:  BP 110/64 (BP Location: Left Arm, Patient Position: Sitting, Cuff Size: Large)   Pulse 79   Ht 5\' 5"  (1.651 m)   Wt 205 lb 9.6 oz (93.3 kg)   SpO2 97%   BMI 34.21 kg/m     Wt Readings from Last 3 Encounters:  02/26/23 205 lb 9.6 oz (93.3 kg)  12/25/22 204 lb 6.4 oz (92.7 kg)  10/14/22 202 lb 6 oz (91.8 kg)     GEN: Well nourished, well developed in no acute distress HEENT: Normal NECK: No JVD; No carotid bruits LYMPHATICS: No lymphadenopathy CARDIAC: S1S2 noted,RRR, no murmurs, rubs, gallops RESPIRATORY:  Clear to auscultation without rales, wheezing or rhonchi  ABDOMEN: Soft, non-tender, non-distended, +bowel sounds, no guarding. EXTREMITIES: No edema, No cyanosis, no clubbing MUSCULOSKELETAL:  No deformity  SKIN: Warm and dry NEUROLOGIC:  Alert and oriented x 3, non-focal PSYCHIATRIC:  Normal affect, good insight  ASSESSMENT:    1. Atherosclerosis of native coronary artery of native heart without angina pectoris   2. Medication management   3. Left ventricular dysfunction   4. PVD (peripheral vascular disease) ([place])   5. Preoperative cardiovascular examination    PLAN:     She is stable from a cardiovascular standpoint today.  Her torsemide was recently adjusted due to hospitalization in [facility].  Will continue this the same way 20 mg twice a day.  Blood work will be done today for BMP and mag.  No angina symptoms.  Device monitoring reviewed.  The patient does not have any unstable cardiac conditions.  Upon evaluation today, she can achieve 4 METs or greater without anginal symptoms.  According to [facility] and AHA guidelines, she requires no further cardiac workup prior to her noncardiac surgery/eye surgery and should be at acceptable risk.  Our service is available as necessary in the  perioperative period.   The patient is in agreement with the above plan. The patient left the office in stable condition.  The patient will follow up in 14 weeks or sooner if needed.   Medication Adjustments/Labs and Tests Ordered: Current medicines are reviewed at length with the patient today.  Concerns regarding medicines are outlined above.  Orders Placed This Encounter  Procedures   Comprehensive Metabolic Panel (CMET)   Magnesium   EKG 12-Lead   Meds ordered this encounter  Medications   torsemide (DEMADEX) 20 MG tablet    Sig: Take 1 tablet (20 mg total) by mouth 2 (two) times daily.    Dispense:  180 tablet    Refill:  3    Patient Instructions  Medication Instructions:   Start Torsemide 20 MG twice daily  *If you need a refill on your cardiac medications before your next appointment, please call your pharmacy*   Lab Work: Your physician recommends that you have lab work today:  CMET Mg2+   If you have labs (blood work) drawn today and your tests are completely normal, you will receive your results only by: [place] (if you have MyChart) OR A paper copy in the mail If you have any lab test that is abnormal or we need to change your treatment, we will call you to review the results.   Testing/Procedures: None ordered   Follow-Up: At [facility], you and  your health needs are our priority.  As part of our continuing mission to provide you with exceptional heart care, we have created designated Provider Care Teams.  These Care Teams include your primary Cardiologist (physician) and Advanced Practice Providers (APPs -  Physician Assistants and Nurse Practitioners) who all work together to provide you with the care you need, when you need it.  We recommend signing up for the patient portal called "MyChart".  Sign up information is provided on this After Visit Summary.  MyChart is used to connect with patients for Virtual Visits (Telemedicine).   Patients are able to view lab/test results, encounter notes, upcoming appointments, etc.  Non-urgent messages can be sent to your provider as well.   To learn more about what you can do with MyChart, go to [email or web address].    Your next appointment:   14 week(s)  Provider:   Berniece Salines, DO        Adopting a Healthy Lifestyle.  Know what a healthy weight is for you (roughly BMI <25) and aim to maintain this   Aim for 7+ servings of fruits and vegetables daily   65-80+ fluid ounces of water or unsweet tea for healthy kidneys   Limit to max 1 drink of alcohol per day; avoid smoking/tobacco   Limit animal fats in diet for cholesterol and heart health - choose grass fed whenever available   Avoid highly processed foods, and foods high in saturated/trans fats   Aim for low stress - take time to unwind and care for your mental health   Aim for 150 min of moderate intensity exercise weekly for heart health, and weights twice weekly for bone health   Aim for 7-9 hours of sleep daily   When it comes to diets, agreement about the perfect plan isnt easy to find, even among the experts. Experts at the [employer] developed an idea known as the Healthy Eating Plate. Just imagine a plate divided into logical, healthy portions.   The emphasis is on diet quality:   Load up on vegetables and fruits - one-half of your plate: Aim for color and variety, and remember that potatoes dont count.   Go for whole grains - one-quarter of your plate: Whole wheat, barley, wheat berries, quinoa, oats, brown rice, and foods made with them. If you want pasta, go with whole wheat pasta.   Protein power - one-quarter of your plate: Fish, chicken, beans, and nuts are all healthy, versatile protein sources. Limit red meat.   The diet, however, does go beyond the plate, offering a few other suggestions.   Use healthy plant oils, such as olive, canola, soy, corn, sunflower and  peanut. Check the labels, and avoid partially hydrogenated oil, which have unhealthy trans fats.   If youre thirsty, drink water. Coffee and tea are good in moderation, but skip sugary drinks and limit milk and dairy products to one or two daily servings.   The type of carbohydrate in the diet is more important than the amount. Some sources of carbohydrates, such as vegetables, fruits, whole grains, and beans-are healthier than others.   Finally, stay active  Signed, Berniece Salines, DO  03/03/2023 12:46 PM    [facility]

## 2023-02-27 LAB — COMPREHENSIVE METABOLIC PANEL
ALT: 11 IU/L (ref 0–32)
AST: 17 IU/L (ref 0–40)
Albumin/Globulin Ratio: 1.2 (ref 1.2–2.2)
Albumin: 4.1 g/dL (ref 3.8–4.8)
Alkaline Phosphatase: 96 IU/L (ref 44–121)
BUN/Creatinine Ratio: 11 — ABNORMAL LOW (ref 12–28)
BUN: 14 mg/dL (ref 8–27)
Bilirubin Total: 0.3 mg/dL (ref 0.0–1.2)
CO2: 24 mmol/L (ref 20–29)
Calcium: 9.2 mg/dL (ref 8.7–10.3)
Chloride: 103 mmol/L (ref 96–106)
Creatinine, Ser: 1.27 mg/dL — ABNORMAL HIGH (ref 0.57–1.00)
Globulin, Total: 3.3 g/dL (ref 1.5–4.5)
Glucose: 137 mg/dL — ABNORMAL HIGH (ref 70–99)
Potassium: 4.3 mmol/L (ref 3.5–5.2)
Sodium: 144 mmol/L (ref 134–144)
Total Protein: 7.4 g/dL (ref 6.0–8.5)
eGFR: 45 mL/min/{1.73_m2} — ABNORMAL LOW (ref >59)

## 2023-02-27 LAB — MAGNESIUM: Magnesium: 2.3 mg/dL (ref 1.6–2.3)

## 2023-03-03 DIAGNOSIS — Z79899 Other long term (current) drug therapy: Secondary | ICD-10-CM | POA: Insufficient documentation

## 2023-03-06 ENCOUNTER — Ambulatory Visit: Payer: Medicare Other | Admitting: Cardiology

## 2023-03-23 ENCOUNTER — Telehealth: Payer: Self-pay | Admitting: Cardiology

## 2023-03-23 MED ORDER — RANOLAZINE ER 500 MG PO TB12: 500.0000 mg | ORAL_TABLET | Freq: Two times a day (BID) | ORAL | 3 refills | Status: DC

## 2023-03-23 MED ORDER — ENTRESTO 24-26 MG PO TABS: 1.0000 | ORAL_TABLET | Freq: Two times a day (BID) | ORAL | 4 refills | Status: DC

## 2023-03-23 MED ORDER — VERQUVO 5 MG PO TABS: 5.0000 mg | ORAL_TABLET | Freq: Every day | ORAL | 3 refills | Status: DC

## 2023-03-23 NOTE — Telephone Encounter (Signed)
*  STAT* If patient is at the pharmacy, call can be transferred to refill team.   1. Which medications need to be refilled? (please list name of each medication and dose if known)   Vericiguat (VERQUVO) 5 MG TABS  (90 day) ranolazine (RANEXA) 500 MG 12 hr tablet  (30 day) sacubitril-valsartan (ENTRESTO) 24-26 MG  (30 day)  2. Which pharmacy/location (including street and city if local pharmacy) is medication to be sent to?  [id number] - [place], [place] - [address] AT [address]   3. Do they need a 30 day or 90 day supply?   Patient stated she still have some of these medications.  Patient stated she wants a call back from [facility] regarding these medications.

## 2023-03-23 NOTE — Telephone Encounter (Signed)
Patient needed refills of listed medications.  Sent to pharmacy as requested

## 2023-03-24 ENCOUNTER — Other Ambulatory Visit: Payer: Self-pay | Admitting: Cardiology

## 2023-04-01 NOTE — Telephone Encounter (Signed)
Patient wants a call back directly from [facility]

## 2023-04-03 ENCOUNTER — Ambulatory Visit (INDEPENDENT_AMBULATORY_CARE_PROVIDER_SITE_OTHER): Payer: Medicare Other

## 2023-04-03 ENCOUNTER — Other Ambulatory Visit: Payer: Self-pay | Admitting: Cardiology

## 2023-04-03 DIAGNOSIS — I255 Ischemic cardiomyopathy: Secondary | ICD-10-CM | POA: Diagnosis not present

## 2023-04-03 LAB — CUP PACEART REMOTE DEVICE CHECK
Battery Remaining Longevity: 96 mo
Battery Remaining Percentage: 100 %
Brady Statistic RA Percent Paced: 0 %
Brady Statistic RV Percent Paced: 100 %
Date Time Interrogation Session: 20240503031100
HighPow Impedance: 80 Ohm
Implantable Lead Connection Status: 753985
Implantable Lead Connection Status: 753985
Implantable Lead Connection Status: 753985
Implantable Lead Implant Date: 20220131
Implantable Lead Implant Date: 20220131
Implantable Lead Implant Date: 20220131
Implantable Lead Location: 753858
Implantable Lead Location: 753859
Implantable Lead Location: 753860
Implantable Lead Model: 137
Implantable Lead Model: 3830
Implantable Lead Model: 7840
Implantable Lead Serial Number: 1047234
Implantable Lead Serial Number: 301179
Implantable Pulse Generator Implant Date: 20220131
Lead Channel Impedance Value: 544 Ohm
Lead Channel Impedance Value: 552 Ohm
Lead Channel Impedance Value: 763 Ohm
Lead Channel Setting Pacing Amplitude: 0.1 V
Lead Channel Setting Pacing Amplitude: 3.5 V
Lead Channel Setting Pacing Amplitude: 3.5 V
Lead Channel Setting Pacing Pulse Width: 0.1 ms
Lead Channel Setting Pacing Pulse Width: 0.4 ms
Lead Channel Setting Sensing Sensitivity: 0.5 mV
Lead Channel Setting Sensing Sensitivity: 1 mV
Pulse Gen Serial Number: 149649
Zone Setting Status: 755011

## 2023-04-06 NOTE — Telephone Encounter (Signed)
Called pt she states she she went to pick up her [facility] prescription and it was $500. Pt needed to move her July 9th, appt moved to July 16th

## 2023-04-13 ENCOUNTER — Other Ambulatory Visit: Payer: Self-pay

## 2023-04-13 MED ORDER — PANTOPRAZOLE SODIUM 40 MG PO TBEC: DELAYED_RELEASE_TABLET | ORAL | 3 refills | Status: DC

## 2023-04-22 ENCOUNTER — Other Ambulatory Visit: Payer: Self-pay | Admitting: *Deleted

## 2023-04-22 MED ORDER — CLOPIDOGREL BISULFATE 75 MG PO TABS: ORAL_TABLET | ORAL | 3 refills | Status: DC

## 2023-04-22 NOTE — Progress Notes (Signed)
Remote ICD transmission.   

## 2023-04-28 ENCOUNTER — Encounter: Payer: Self-pay | Admitting: Oncology

## 2023-05-01 ENCOUNTER — Encounter: Payer: Self-pay | Admitting: Physician Assistant

## 2023-05-01 ENCOUNTER — Ambulatory Visit (INDEPENDENT_AMBULATORY_CARE_PROVIDER_SITE_OTHER): Payer: Medicare Other | Admitting: Physician Assistant

## 2023-05-01 VITALS — BP 110/64 | HR 76 | Ht 63.5 in | Wt 203.4 lb

## 2023-05-01 DIAGNOSIS — K5904 Chronic idiopathic constipation: Secondary | ICD-10-CM

## 2023-05-01 DIAGNOSIS — Z9049 Acquired absence of other specified parts of digestive tract: Secondary | ICD-10-CM

## 2023-05-01 MED ORDER — LUBIPROSTONE 24 MCG PO CAPS: 24.0000 ug | ORAL_CAPSULE | Freq: Two times a day (BID) | ORAL | 3 refills | Status: DC

## 2023-05-01 NOTE — Progress Notes (Signed)
Chief Complaint: Constipation, Hemorrhoids, Rectal Bleeding  HPI:    Karen Warner is a 72 year old African-American female with a past medical history as listed below including CHF, COPD, CAD status post stent on Plavix, ischemic cardiomyopathy status post biventricular ICD placement 12/2020, IDA with intestinal AVMs, diverticular disease status post left hemicolectomy, epiploic appendicitis, colon polyps, gastritis and multiple others, known to Dr. Lavon Paganini, who was referred to me by Hadley Pen, MD for a complaint of constipation, hemorrhoids and rectal bleeding.      10/07/2019 colonoscopy with diminutive colon polyps, diverticula and anastomosis left colon hemicolectomy at 6 cm.    05/15/2022 patient seen in clinic for evaluation of nausea and lower abdominal pain.  At that time discussed history of small bowel obstruction.  Had a CT which showed 2 transition points likely adhesions and x-ray was ordered and recommended low fiber diet.  Noted that patient was high risk for surgical/endoscopic procedures given CAD status post stents, CHF with AICD and chronic angina.    05/16/2022 abdominal x-ray with no significant colonic stool burden.  No bowel obstruction.  Patient was continued on Linzess.  Discussed trial of Trulance if symptoms continued.    08/21/2022 iron studies showed a ferritin low at 10 and otherwise normal.  B12 normal.  TSH normal.  CBC normal.    10/14/2022 patient seen in clinic for a stomach ache for 5 years.  She was on Linzess 72 mcg daily for a few months but that gave her urgent diarrhea 2 hours after taking it.  Never felt completely emptied out and it caused her a lot of distress.  That time started Amitiza 8 mcg twice a day with food.  Told her to call and let me know how she is doing.    Today, the patient tells me that she has been taking her Amitiza 8 mcg mostly twice daily over the past 6 months but tells me it is unpredictable for her.  Most of the time she still has  small hard balls and never feels emptied and occasionally will have diarrhea.  Tells me she went back to taking her Linzess 72 mcg once a couple of days ago because she really felt full up and tells me within 2 hours she has a rash of diarrhea and feels more empty.  Does describe very occasional bright red blood on the toilet paper, the last a few weeks ago after passing some hard stool.  She has not seen any since then.  Does tell me when she gets "backed up", she feels like she can feel things backing up all the way into her throat which is relieved when she is able to have a good bowel movement.    Denies fever, chills, weight loss, nausea or vomiting.  Past Medical History:  Diagnosis Date   Anemia 12/12/2019   Arthralgia 01/14/2020   Arthritis    Asthma    Atherosclerotic heart disease of native coronary artery without angina pectoris 04/13/2014   CHF (congestive heart failure) (HCC)    Chronic bilateral low back pain with bilateral sciatica 01/13/2019   Chronic bladder pain 11/08/2014   Last Assessment & Plan:  Formatting of this note might be different from the original. Patient has chronic pain syndrome in general I think this is unfortunately worsening her pelvic pain and bladder pain her examination today was very reassuring I am advised her to use the estrogen cream I did start her on Elavil 10 milligrams at that time if  she does tolerated will increase it to 25 milligrams.    Chronic headaches    Chronic idiopathic constipation 12/22/2014   Formatting of this note might be different from the original. Last Assessment & Plan:  For better bowel emptying please use either Citrucel / Benefiber start with 2 tablespoons daily and titrate up or down to effect.  Also please use glycerin suppositories as needed to assist in evacuation. Last Assessment & Plan:  Formatting of this note might be different from the original. For better bowel empt   Chronic obstructive pulmonary disease, unspecified  (HCC) 03/18/2018   Class 1 obesity due to excess calories with serious comorbidity and body mass index (BMI) of 31.0 to 31.9 in adult 06/26/2020   Colon polyps    COPD (chronic obstructive pulmonary disease) (HCC)    Coronary artery disease    DDD (degenerative disc disease), cervical 01/13/2019   Depression 11/05/2019   Diabetic polyneuropathy associated with type 2 diabetes mellitus (HCC) 02/24/2019   Diverticulitis 05/20/2018   Diverticulitis of colon 03/18/2018   Family history of ischemic heart disease (IHD) 08/16/2013   Fibromyalgia affecting multiple sites 01/14/2020   Gastro-esophageal reflux disease without esophagitis 01/13/2019   Glaucoma 11/08/2014   Hordeolum externum of left upper eyelid 03/13/2020   Hypertension 11/08/2014   IBS (irritable bowel syndrome)    Iron deficiency anemia 01/13/2019   Left renal mass 11/08/2019   Leukocytosis 05/28/2018   Low vitamin B12 level 03/13/2020   Low vitamin D level 12/12/2019   LV dysfunction 05/31/2019   Malignant essential hypertension 01/22/2016   Migraine, unspecified, not intractable, without status migrainosus 11/08/2014   Mixed hyperlipidemia 01/13/2019   Myocardial infarction (HCC)    Other premature beats 11/08/2014   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 Last Assessment & Plan:  For vaginal atrophy please place a pea size dab of Estrogen vaginal cream  into the vagina 3 times a week ( Monday, Wednesday, Friday) Last Assessment & Plan:  Formatting of this note might be different from the original. For vaginal atrophy please place a pea size dab of Estrogen  vaginal cream  into the vagina     Past Surgical History:  Procedure Laterality Date   ABDOMINAL HYSTERECTOMY     BIV ICD INSERTION CRT-D N/A 12/31/2020   Procedure: BIV ICD INSERTION CRT-D;  Surgeon: Marinus Maw, MD;  Location: [facility] INVASIVE CV LAB;  Service: Cardiovascular;  Laterality: N/A;   BLEPHAROPLASTY Bilateral    COLON SURGERY     CORONARY ANGIOPLASTY WITH STENT PLACEMENT  2020   X3   CORONARY PRESSURE/FFR STUDY N/A 08/03/2020   Procedure: INTRAVASCULAR PRESSURE WIRE/FFR STUDY;  Surgeon: Tonny Bollman, MD;  Location: [facility] INVASIVE CV LAB;  Service: Cardiovascular;  Laterality: N/A;   CORONARY STENT INTERVENTION N/A 08/03/2020   Procedure: CORONARY STENT INTERVENTION;  Surgeon: Tonny Bollman, MD;  Location: [facility] INVASIVE CV LAB;  Service: Cardiovascular;  Laterality: N/A;   LEFT HEART CATH AND CORONARY ANGIOGRAPHY N/A 08/03/2020   Procedure: LEFT HEART CATH AND CORONARY ANGIOGRAPHY;  Surgeon: Tonny Bollman, MD;  Location: [facility] INVASIVE CV LAB;  Service: Cardiovascular;  Laterality: N/A;  REFRACTIVE SURGERY Left    [place]    Current Outpatient Medications  Medication Sig Dispense Refill   acetaminophen (TYLENOL) 325 MG tablet Take 2 tablets by mouth every 6 (six) hours as needed for pain.     ACETAMINOPHEN-BUTALBITAL 50-325 MG TABS Take 1 tablet by mouth every 4 (four) hours as needed for severe pain.     albuterol (PROVENTIL) (2.5 MG/3ML) 0.083% nebulizer solution INL CONTENTS OF 1 VIAL VIA NEBULIZER Q 4 H PRF WHEEZING OR COUGH     APPLE CIDER VINEGAR PO Take 2 tablets by mouth 2 (two) times daily. With digestive detox 1 capsule daily     aspirin 81 MG EC tablet 81 mg daily.     baclofen (LIORESAL) 10 MG tablet Take 10 mg by mouth 3 (three) times daily.     BD PEN NEEDLE NANO 2ND GEN 32G X 4 MM MISC 3 (three) times daily.     Bromfenac Sodium 0.09 % SOLN Place 1 drop into the left eye daily.     carvedilol (COREG) 3.125 MG tablet TAKE 1 TABLET(3.125 MG) BY MOUTH TWICE DAILY  WITH A MEAL 180 tablet 3   clopidogrel (PLAVIX) 75 MG tablet TAKE 1 TABLET(75 MG) BY MOUTH DAILY 90 tablet 3   Continuous Glucose Sensor (DEXCOM G7 SENSOR) MISC SMARTSIG:1 Topical Every 10 Days     dapagliflozin propanediol (FARXIGA) 10 MG TABS tablet TAKE 1 TABLET(10 MG) BY MOUTH DAILY BEFORE BREAKFAST 90 tablet 1   diclofenac Sodium (VOLTAREN) 1 % GEL Apply 2 g topically 2 (two) times daily as needed (pain).     dicyclomine (BENTYL) 10 MG capsule TAKE 1 CAPSULE(10 MG) BY MOUTH IN THE MORNING AND AT BEDTIME 180 capsule 1   famotidine (PEPCID) 40 MG tablet TAKE 1 TABLET(40 MG) BY MOUTH TWICE DAILY 60 tablet 2   fluticasone (FLONASE) 50 MCG/ACT nasal spray Place 1 spray into both nostrils as needed for allergies or rhinitis.     folic acid (FOLVITE) 1 MG tablet Take 1 mg by mouth daily.     glycerin adult 2 g suppository Place 1 suppository rectally as needed for constipation. Apply after bowel movements. 12 suppository 0   insulin lispro (HUMALOG) 100 UNIT/ML KwikPen Inject 16 Units into the skin 3 (three) times daily. Administer 16 units three times daily before a meal per sliding scale     ipratropium (ATROVENT) 0.03 % nasal spray Place 2 sprays into the nose in the morning, at noon, and at bedtime.     isosorbide mononitrate (IMDUR) 30 MG 24 hr tablet Take 15 mg by mouth daily.     lidocaine (LIDODERM) 5 % Place 1 patch onto the skin daily. Remove & Discard patch within 12 hours or as directed by MD 5 patch 0   LINZESS 72 MCG capsule Take 72 mcg by mouth every other day.     loratadine (CLARITIN) 10 MG tablet Take 10 mg by mouth daily.     lubiprostone (AMITIZA) 8 MCG capsule Take 1 capsule (8 mcg total) by mouth 2 (two) times daily with a meal. 60 capsule 3   moxifloxacin (VIGAMOX) 0.5 % ophthalmic solution Place 1 drop into the left eye 4 (four) times daily.     nitroGLYCERIN (NITROSTAT) 0.4 MG SL tablet Place 1 tablet (0.4 mg total) under the tongue every 5 (five) minutes as needed for chest  pain. 25 tablet 3   nystatin (MYCOSTATIN) 100000 UNIT/ML suspension Take 5 mLs by mouth as needed.  pantoprazole (PROTONIX) 40 MG tablet TAKE 1 TABLET(40 MG) BY MOUTH DAILY 90 tablet 3   prednisoLONE acetate (PRED FORTE) 1 % ophthalmic suspension Place 1 drop into the left eye in the morning, at noon, in the evening, and at bedtime.     pregabalin (LYRICA) 50 MG capsule Take 50 mg by mouth 3 (three) times daily.     ranolazine (RANEXA) 500 MG 12 hr tablet Take 1 tablet (500 mg total) by mouth 2 (two) times daily. 180 tablet 3   rosuvastatin (CRESTOR) 40 MG tablet Take 40 mg by mouth daily.     sacubitril-valsartan (ENTRESTO) 24-26 MG Take 1 tablet by mouth 2 (two) times daily. 60 tablet 4   torsemide (DEMADEX) 20 MG tablet Take 1 tablet (20 mg total) by mouth 2 (two) times daily. 180 tablet 3   TRESIBA FLEXTOUCH 200 UNIT/ML FlexTouch Pen Inject 40 Units into the skin 2 (two) times daily. 40 units in the morning and up to 45 units at night     TRINTELLIX 5 MG TABS tablet Take 5 mg by mouth at bedtime.     Ubrogepant 50 MG TABS Take 1 tablet by mouth as needed.     valACYclovir (VALTREX) 500 MG tablet Take 500 mg by mouth daily.     VERQUVO 5 MG TABS TAKE 1 TABLET BY MOUTH EVERY DAY 90 tablet 3   vitamin B-12 (CYANOCOBALAMIN) 1000 MCG tablet Take 1,000 mcg by mouth daily.     Vitamin D, Ergocalciferol, (DRISDOL) 1.25 MG (50000 UNIT) CAPS capsule Take 50,000 Units by mouth once a week. sundays     No current facility-administered medications for this visit.    Allergies as of 05/01/2023 - Review Complete 05/01/2023  Allergen Reaction Noted   Bee venom Anaphylaxis 02/01/2018   Sumatriptan Other (See Comments) and Shortness Of Breath 11/02/2020   Tramadol Hives 05/06/2019   Amoxicillin-pot clavulanate Diarrhea, Nausea And Vomiting, and Other (See Comments) 11/08/2014   Oxycodone Itching and Hives 11/08/2014   Buprenorphine hcl  04/19/2012   Clarithromycin  04/19/2012   Duloxetine hcl   04/19/2012   Hydrocodone  04/19/2012   Lactose  05/26/2018   Liraglutide  04/19/2012   Tramadol hcl  04/19/2012    Family History  Problem Relation Age of Onset   Lung cancer Mother    Coronary artery disease Mother    Diabetes Mother    Glaucoma Mother    Hypertension Mother    Colon polyps Mother    Migraines Father    Heart disease Father    Diabetes Maternal Grandmother    Diabetes Daughter    Migraines Son    Hypertension Brother     Social History   Socioeconomic History   Marital status: Significant Other    Spouse name: Not on file   Number of children: 3   Years of education: Not on file   Highest education level: Not on file  Occupational History   Occupation: retired  Tobacco Use   Smoking status: Former    Types: Cigarettes    Quit date: 2021    Years since quitting: 3.4   Smokeless tobacco: Never  Vaping Use   Vaping Use: Never used  Substance and Sexual Activity   Alcohol use: Never   Drug use: Never   Sexual activity: Not on file  Other Topics Concern   Not on file  Social History Narrative   Not on file   Social Determinants of Health   Financial Resource Strain:  Not on file  Food Insecurity: Not on file  Transportation Needs: Not on file  Physical Activity: Not on file  Stress: Not on file  Social Connections: Not on file  Intimate Partner Violence: Not on file    Review of Systems:    Constitutional: No weight loss, fever or chills Cardiovascular: No chest pain  Respiratory: No SOB  Gastrointestinal: See HPI and otherwise negative   Physical Exam:  Vital signs: BP 110/64 (BP Location: Left Arm, Patient Position: Sitting, Cuff Size: Normal)   Pulse 76   Ht 5' 3.5" (1.613 m)   Wt 203 lb 6 oz (92.3 kg)   BMI 35.46 kg/m    Constitutional:   Pleasant obese AA female appears to be in NAD, Well developed, Well nourished, alert and cooperative Respiratory: Respirations even and unlabored. Lungs clear to auscultation bilaterally.    No wheezes, crackles, or rhonchi.  Cardiovascular: Normal S1, S2. No MRG. Regular rate and rhythm. No peripheral edema, cyanosis or pallor.  Gastrointestinal:  Soft, nondistended, mild generalized ttp, No rebound or guarding. Normal bowel sounds. No appreciable masses or hepatomegaly. Rectal:  Not performed.  Psychiatric: Demonstrates good judgement and reason without abnormal affect or behaviors.  RELEVANT LABS AND IMAGING: CBC    Component Value Date/Time   WBC 6.6 12/25/2022 0000   WBC 7.9 12/30/2020 0021   RBC 4.33 12/25/2022 0000   HGB 13.3 12/25/2022 0000   HCT 40 12/25/2022 0000   PLT 281 12/25/2022 0000   MCV 86 02/14/2021 0000   MCH 27.0 12/30/2020 0021   MCHC 29.9 (L) 12/30/2020 0021   RDW 15.4 12/30/2020 0021    CMP     Component Value Date/Time   NA 144 02/26/2023 1234   K 4.3 02/26/2023 1234   CL 103 02/26/2023 1234   CO2 24 02/26/2023 1234   GLUCOSE 137 (H) 02/26/2023 1234   GLUCOSE 252 (H) 12/30/2020 0021   BUN 14 02/26/2023 1234   CREATININE 1.27 (H) 02/26/2023 1234   CALCIUM 9.2 02/26/2023 1234   PROT 7.4 02/26/2023 1234   ALBUMIN 4.1 02/26/2023 1234   AST 17 02/26/2023 1234   ALT 11 02/26/2023 1234   ALKPHOS 96 02/26/2023 1234   BILITOT 0.3 02/26/2023 1234   GFRNONAA 39 (L) 01/16/2021 1226   GFRNONAA 51 (L) 12/30/2020 0021   GFRAA 45 (L) 01/16/2021 1226    Assessment: 1.  Chronic constipation: History of prior hemicolectomy and issues with constipation since, Linzess 72 mg/g too strong, and Amitiza 8 mcg twice daily not strong enough, last colonoscopy normal in November 2020; thought related to IBS/slow transit 2.  History of small bowel obstruction with history of partial colectomy and hysterectomy in the past 3.  Significant cardiac history: CAD status post stents on Plavix, CHF with AICD and chronic angina  Plan: 1.  Discussed options with the patient.  She would like to try an increased dose of Amitiza.  Recommended 24 mcg twice daily.  Sent a  prescription #60 with 5 refills.  This should be used with meals twice daily. 2.  Told the patient to call and let me know how she is doing over the next couple of weeks.  If this not working for her we may did try something else such as Trulance. 3.  Patient to follow in clinic with me in the next 6 to 8 weeks or sooner if necessary.  Hyacinth Meeker, PA-C [facility] 05/01/2023, 10:27 AM  Cc: Hadley Pen, MD

## 2023-05-01 NOTE — Patient Instructions (Addendum)
We have sent the following medications to your pharmacy for you to pick up at your convenience: Amitiza 24 mcg twice daily.   Call or send Mychart message in 2 weeks with an update on how you are feeling.  _______________________________________________________  If your blood pressure at your visit was 140/90 or greater, please contact your primary care physician to follow up on this.  _______________________________________________________  If you are age 46 or older, your body mass index should be between 23-30. Your Body mass index is 35.46 kg/m. If this is out of the aforementioned range listed, please consider follow up with your Primary Care Provider.  If you are age 43 or younger, your body mass index should be between 19-25. Your Body mass index is 35.46 kg/m. If this is out of the aformentioned range listed, please consider follow up with your Primary Care Provider.   ________________________________________________________  The [facility] providers would like to encourage you to use [facility] to communicate with providers for non-urgent requests or questions.  Due to long hold times on the telephone, sending your provider a message by [facility] may be a faster and more efficient way to get a response.  Please allow 48 business hours for a response.  Please remember that this is for non-urgent requests.  _______________________________________________________

## 2023-06-09 ENCOUNTER — Ambulatory Visit: Payer: Medicare Other | Admitting: Cardiology

## 2023-06-16 ENCOUNTER — Ambulatory Visit: Payer: Medicare Other | Attending: Cardiology | Admitting: Cardiology

## 2023-06-16 VITALS — BP 120/82 | HR 84 | Ht 65.0 in | Wt 203.0 lb

## 2023-06-16 DIAGNOSIS — Z79899 Other long term (current) drug therapy: Secondary | ICD-10-CM | POA: Diagnosis present

## 2023-06-16 DIAGNOSIS — Z0000 Encounter for general adult medical examination without abnormal findings: Secondary | ICD-10-CM | POA: Diagnosis present

## 2023-06-16 DIAGNOSIS — I110 Hypertensive heart disease with heart failure: Secondary | ICD-10-CM | POA: Diagnosis present

## 2023-06-16 DIAGNOSIS — G4733 Obstructive sleep apnea (adult) (pediatric): Secondary | ICD-10-CM | POA: Insufficient documentation

## 2023-06-16 DIAGNOSIS — I255 Ischemic cardiomyopathy: Secondary | ICD-10-CM | POA: Insufficient documentation

## 2023-06-16 DIAGNOSIS — Z95810 Presence of automatic (implantable) cardiac defibrillator: Secondary | ICD-10-CM | POA: Insufficient documentation

## 2023-06-16 DIAGNOSIS — I2510 Atherosclerotic heart disease of native coronary artery without angina pectoris: Secondary | ICD-10-CM | POA: Diagnosis present

## 2023-06-16 DIAGNOSIS — I739 Peripheral vascular disease, unspecified: Secondary | ICD-10-CM | POA: Insufficient documentation

## 2023-06-16 NOTE — Progress Notes (Signed)
Cardiology Office Note:    Date:  06/16/2023   ID:  [name], DOB [birth year], MRN [id number]  PCP:  Hadley Pen, MD  Cardiologist:  Thomasene Ripple, DO  Electrophysiologist:  Lewayne Bunting, MD   Referring MD: Hadley Pen, MD   " I am doing ok"   History of Present Illness:    Karen Warner is a 72 y.o. female with a hx of coronary artery disease status post PCI, ischemic cardiomyopathy status post BiV pacemaker during her hospitalization in January 2022 presented for syncope episode, heart failure reduced ejection fraction recent EF 30% on her echo which was done in March 2023 and  sleep apnea is here today for follow-up visit.   I saw the patient on February 18, 2023 at that time she was stable from a cardiovascular standpoint.  No changes were made to her medication.  Did blood work.  Since I saw the patient she has not had any hospitalization she is doing well from a cardiovascular standpoint.  She had had cataract removed a cyst removed from her forehead.  She is pending back procedure.  Past Medical History:  Diagnosis Date   Anemia 12/12/2019   Arthralgia 01/14/2020   Arthritis    Asthma    Atherosclerotic heart disease of native coronary artery without angina pectoris 04/13/2014   CHF (congestive heart failure) (HCC)    Chronic bilateral low back pain with bilateral sciatica 01/13/2019   Chronic bladder pain 11/08/2014   Last Assessment & Plan:  Formatting of this note might be different from the original. Patient has chronic pain syndrome in general I think this is unfortunately worsening her pelvic pain and bladder pain her examination today was very reassuring I am advised her to use the estrogen cream I did start her on Elavil 10 milligrams at that time if she does tolerated will increase it to 25 milligrams.    Chronic headaches    Chronic idiopathic constipation 12/22/2014   Formatting of this note might be different from the original.  Last Assessment & Plan:  For better bowel emptying please use either Citrucel / Benefiber start with 2 tablespoons daily and titrate up or down to effect.  Also please use glycerin suppositories as needed to assist in evacuation. Last Assessment & Plan:  Formatting of this note might be different from the original. For better bowel empt   Chronic obstructive pulmonary disease, unspecified (HCC) 03/18/2018   Class 1 obesity due to excess calories with serious comorbidity and body mass index (BMI) of 31.0 to 31.9 in adult 06/26/2020   Colon polyps    COPD (chronic obstructive pulmonary disease) (HCC)    Coronary artery disease    DDD (degenerative disc disease), cervical 01/13/2019   Depression 11/05/2019   Diabetic polyneuropathy associated with type 2 diabetes mellitus (HCC) 02/24/2019   Diverticulitis 05/20/2018   Diverticulitis of colon 03/18/2018   Family history of ischemic heart disease (IHD) 08/16/2013   Fibromyalgia affecting multiple sites 01/14/2020   Gastro-esophageal reflux disease without esophagitis 01/13/2019   Glaucoma 11/08/2014   Hordeolum externum of left upper eyelid 03/13/2020   Hypertension 11/08/2014   IBS (irritable bowel syndrome)    Iron deficiency anemia 01/13/2019   Left renal mass 11/08/2019   Leukocytosis 05/28/2018   Low vitamin B12 level 03/13/2020   Low vitamin D level 12/12/2019   LV dysfunction 05/31/2019   Malignant essential hypertension 01/22/2016   Migraine, unspecified, not intractable, without status migrainosus 11/08/2014  Mixed hyperlipidemia 01/13/2019   Myocardial infarction [facility])    Other premature beats 11/08/2014   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 Last Assessment & Plan:  For vaginal atrophy please place a pea size dab of Estrogen vaginal cream  into the vagina 3 times a week ( Monday, Wednesday, Friday) Last Assessment & Plan:  Formatting of this note might be different from the original. For vaginal atrophy please place a pea size dab of Estrogen vaginal cream  into the vagina     Past Surgical History:  Procedure Laterality Date   ABDOMINAL HYSTERECTOMY     BIV ICD INSERTION CRT-D N/A 12/31/2020   Procedure: BIV ICD INSERTION CRT-D;  Surgeon: Marinus Maw, MD;  Location: [facility] INVASIVE CV LAB;  Service: Cardiovascular;  Laterality: N/A;   BLEPHAROPLASTY Bilateral    COLON SURGERY     CORONARY ANGIOPLASTY WITH STENT PLACEMENT  2020   X3   CORONARY PRESSURE/FFR STUDY N/A 08/03/2020   Procedure: INTRAVASCULAR PRESSURE WIRE/FFR STUDY;  Surgeon: Tonny Bollman, MD;  Location: [facility] INVASIVE CV LAB;  Service: Cardiovascular;  Laterality: N/A;   CORONARY STENT INTERVENTION N/A 08/03/2020   Procedure: CORONARY STENT INTERVENTION;  Surgeon: Tonny Bollman, MD;  Location: [facility] INVASIVE CV LAB;  Service: Cardiovascular;  Laterality: N/A;   LEFT HEART CATH AND CORONARY ANGIOGRAPHY N/A 08/03/2020   Procedure: LEFT HEART CATH AND CORONARY ANGIOGRAPHY;  Surgeon: Tonny Bollman, MD;  Location: [facility] INVASIVE CV LAB;  Service: Cardiovascular;  Laterality: N/A;   REFRACTIVE SURGERY Left    [place]    Current Medications: Current Meds  Medication Sig   acetaminophen (TYLENOL) 325 MG tablet Take 2 tablets by mouth every 6 (six) hours as needed for pain.   ACETAMINOPHEN-BUTALBITAL 50-325 MG TABS Take 1 tablet by mouth every 4 (four) hours as needed for severe pain.   albuterol (PROVENTIL) (2.5 MG/3ML) 0.083% nebulizer solution INL CONTENTS OF 1 VIAL VIA NEBULIZER Q 4 H PRF WHEEZING OR COUGH   APPLE CIDER VINEGAR PO Take 2 tablets by mouth 2 (two) times daily. With digestive  detox 1 capsule daily   aspirin 81 MG EC tablet 81 mg daily.   baclofen (LIORESAL) 10 MG tablet Take 10 mg by mouth 3 (three) times daily.   BD PEN NEEDLE NANO 2ND GEN 32G X 4 MM MISC 3 (three) times daily.   carvedilol (COREG) 3.125 MG tablet TAKE 1 TABLET(3.125 MG) BY MOUTH TWICE DAILY WITH A MEAL   clopidogrel (PLAVIX) 75 MG tablet TAKE 1 TABLET(75 MG) BY MOUTH DAILY   Continuous Glucose Sensor (DEXCOM G7 SENSOR) MISC SMARTSIG:1 Topical Every 10 Days   dapagliflozin propanediol (FARXIGA) 10 MG TABS tablet TAKE 1 TABLET(10 MG) BY MOUTH DAILY BEFORE BREAKFAST   diclofenac Sodium (VOLTAREN) 1 % GEL Apply 2 g topically 2 (two) times daily as needed (pain).   dicyclomine (BENTYL) 10 MG capsule TAKE 1 CAPSULE(10 MG) BY MOUTH IN THE MORNING AND AT BEDTIME   famotidine (PEPCID) 40 MG tablet TAKE 1 TABLET(40 MG) BY MOUTH TWICE DAILY   fluticasone (FLONASE) 50 MCG/ACT nasal spray Place 1 spray into both nostrils as needed for allergies  or rhinitis.   folic acid (FOLVITE) 1 MG tablet Take 1 mg by mouth daily.   glycerin adult 2 g suppository Place 1 suppository rectally as needed for constipation. Apply after bowel movements.   insulin lispro (HUMALOG) 100 UNIT/ML KwikPen Inject 16 Units into the skin 3 (three) times daily. Administer 16 units three times daily before a meal per sliding scale   ipratropium (ATROVENT) 0.03 % nasal spray Place 2 sprays into the nose in the morning, at noon, and at bedtime.   isosorbide mononitrate (IMDUR) 30 MG 24 hr tablet Take 15 mg by mouth daily.   lidocaine (LIDODERM) 5 % Place 1 patch onto the skin daily. Remove & Discard patch within 12 hours or as directed by MD   loratadine (CLARITIN) 10 MG tablet Take 10 mg by mouth daily.   lubiprostone (AMITIZA) 24 MCG capsule Take 1 capsule (24 mcg total) by mouth 2 (two) times daily with a meal.   nitroGLYCERIN (NITROSTAT) 0.4 MG SL tablet Place 1 tablet (0.4 mg total) under the tongue every 5 (five) minutes as needed for  chest pain.   nystatin (MYCOSTATIN) 100000 UNIT/ML suspension Take 5 mLs by mouth as needed.   pantoprazole (PROTONIX) 40 MG tablet TAKE 1 TABLET(40 MG) BY MOUTH DAILY   pregabalin (LYRICA) 50 MG capsule Take 50 mg by mouth 3 (three) times daily.   ranolazine (RANEXA) 500 MG 12 hr tablet Take 1 tablet (500 mg total) by mouth 2 (two) times daily.   rosuvastatin (CRESTOR) 40 MG tablet Take 40 mg by mouth daily.   sacubitril-valsartan (ENTRESTO) 24-26 MG Take 1 tablet by mouth 2 (two) times daily.   torsemide (DEMADEX) 20 MG tablet Take 1 tablet (20 mg total) by mouth 2 (two) times daily.   TRESIBA FLEXTOUCH 200 UNIT/ML FlexTouch Pen Inject 40 Units into the skin 2 (two) times daily. 40 units in the morning and up to 45 units at night   TRINTELLIX 5 MG TABS tablet Take 5 mg by mouth at bedtime.   Ubrogepant 50 MG TABS Take 1 tablet by mouth as needed.   valACYclovir (VALTREX) 500 MG tablet Take 500 mg by mouth daily.   VERQUVO 5 MG TABS TAKE 1 TABLET BY MOUTH EVERY DAY   vitamin B-12 (CYANOCOBALAMIN) 1000 MCG tablet Take 1,000 mcg by mouth daily.   Vitamin D, Ergocalciferol, (DRISDOL) 1.25 MG (50000 UNIT) CAPS capsule Take 50,000 Units by mouth once a week. sundays     Allergies:   Bee venom, Sumatriptan, Tramadol, Amoxicillin-pot clavulanate, Oxycodone, Buprenorphine hcl, Clarithromycin, Duloxetine hcl, Hydrocodone, Lactose, Liraglutide, and Tramadol hcl   Social History   Socioeconomic History   Marital status: Significant Other    Spouse name: Not on file   Number of children: 3   Years of education: Not on file   Highest education level: Not on file  Occupational History   Occupation: retired  Tobacco Use   Smoking status: Former    Current packs/day: 0.00    Types: Cigarettes    Quit date: 2021    Years since quitting: 3.5   Smokeless tobacco: Never  Vaping Use   Vaping status: Never Used  Substance and Sexual Activity   Alcohol use: Never   Drug use: Never   Sexual  activity: Not on file  Other Topics Concern   Not on file  Social History Narrative   Not on file   Social Determinants of Health   Financial Resource Strain: Not on file  Food Insecurity:  Not on file  Transportation Needs: Not on file  Physical Activity: Not on file  Stress: Not on file  Social Connections: Not on file     Family History: The patient's family history includes Colon polyps in her mother; Coronary artery disease in her mother; Diabetes in her daughter, maternal grandmother, and mother; Glaucoma in her mother; Heart disease in her father; Hypertension in her brother and mother; Lung cancer in her mother; Migraines in her father and son.  ROS:   Review of Systems  Constitution: Negative for decreased appetite, fever and weight gain.  HENT: Negative for congestion, ear discharge, hoarse voice and sore throat.   Eyes: Negative for discharge, redness, vision loss in right eye and visual halos.  Cardiovascular: Negative for chest pain, dyspnea on exertion, leg swelling, orthopnea and palpitations.  Respiratory: Negative for cough, hemoptysis, shortness of breath and snoring.   Endocrine: Negative for heat intolerance and polyphagia.  Hematologic/Lymphatic: Negative for bleeding problem. Does not bruise/bleed easily.  Skin: Negative for flushing, nail changes, rash and suspicious lesions.  Musculoskeletal: Negative for arthritis, joint pain, muscle cramps, myalgias, neck pain and stiffness.  Gastrointestinal: Negative for abdominal pain, bowel incontinence, diarrhea and excessive appetite.  Genitourinary: Negative for decreased libido, genital sores and incomplete emptying.  Neurological: Negative for brief paralysis, focal weakness, headaches and loss of balance.  Psychiatric/Behavioral: Negative for altered mental status, depression and suicidal ideas.  Allergic/Immunologic: Negative for HIV exposure and persistent infections.    EKGs/Labs/Other Studies Reviewed:     The following studies were reviewed today:   EKG:  The ekg ordered today demonstrates   Recent Labs: 12/25/2022: Hemoglobin 13.3; Platelets 281 02/26/2023: ALT 11; BUN 14; Creatinine, Ser 1.27; Magnesium 2.3; Potassium 4.3; Sodium 144  Recent Lipid Panel No results found for: "CHOL", "TRIG", "HDL", "CHOLHDL", "VLDL", "LDLCALC", "LDLDIRECT"  Physical Exam:    VS:  BP 120/82   Pulse 84   Ht 5\' 5"  (1.651 m)   Wt 203 lb (92.1 kg)   SpO2 96%   BMI 33.78 kg/m     Wt Readings from Last 3 Encounters:  06/16/23 203 lb (92.1 kg)  05/01/23 203 lb 6 oz (92.3 kg)  02/26/23 205 lb 9.6 oz (93.3 kg)     GEN: Well nourished, well developed in no acute distress HEENT: Normal NECK: No JVD; No carotid bruits LYMPHATICS: No lymphadenopathy CARDIAC: S1S2 noted,RRR, no murmurs, rubs, gallops RESPIRATORY:  Clear to auscultation without rales, wheezing or rhonchi  ABDOMEN: Soft, non-tender, non-distended, +bowel sounds, no guarding. EXTREMITIES: No edema, No cyanosis, no clubbing MUSCULOSKELETAL:  No deformity  SKIN: Warm and dry NEUROLOGIC:  Alert and oriented x 3, non-focal PSYCHIATRIC:  Normal affect, good insight  ASSESSMENT:    1. Ischemic cardiomyopathy   2. Encounter for medical examination to establish care   3. Medication management   4. PVD (peripheral vascular disease) (HCC)   5. Hypertensive heart disease with heart failure (HCC)   6. CAD in native artery   7. Obstructive sleep apnea   8. Biventricular ICD (implantable cardioverter-defibrillator) in place     PLAN:    Will get a repeat echocardiogram to reassess her LV function she is on optimal medicines her last echo in 2023 showed slightly improved EF 30%.  Blood work will be done today.  Last device check was done in May 2024 all appropriate no arrhythmia noted.   The patient is in agreement with the above plan. The patient left the office in stable condition.  The patient will follow up in 6 months or sooner if  needed.   Medication Adjustments/Labs and Tests Ordered: Current medicines are reviewed at length with the patient today.  Concerns regarding medicines are outlined above.  Orders Placed This Encounter  Procedures   Magnesium   Basic metabolic panel   CBC With Differential   Ambulatory referral to Family Practice   ECHOCARDIOGRAM COMPLETE   No orders of the defined types were placed in this encounter.   Patient Instructions  Medication Instructions:  Your physician recommends that you continue on your current medications as directed. Please refer to the Current Medication list given to you today.  *If you need a refill on your cardiac medications before your next appointment, please call your pharmacy*   Lab Work: CBC  BMET MAGNESIUM  If you have labs (blood work) drawn today and your tests are completely normal, you will receive your results only by: MyChart Message (if you have MyChart) OR A paper copy in the mail If you have any lab test that is abnormal or we need to change your treatment, we will call you to review the results.   Testing/Procedures: Your physician has requested that you have an echocardiogram. Echocardiography is a painless test that uses sound waves to create images of your heart. It provides your doctor with information about the size and shape of your heart and how well your heart's chambers and valves are working. This procedure takes approximately one hour. There are no restrictions for this procedure. Please do NOT wear cologne, perfume, aftershave, or lotions (deodorant is allowed). Please arrive 15 minutes prior to your appointment time.    Follow-Up: At [facility], you and your health needs are our priority.  As part of our continuing mission to provide you with exceptional heart care, we have created designated Provider Care Teams.  These Care Teams include your primary Cardiologist (physician) and Advanced Practice Providers (APPs  -  Physician Assistants and Nurse Practitioners) who all work together to provide you with the care you need, when you need it.  We recommend signing up for the patient portal called "MyChart".  Sign up information is provided on this After Visit Summary.  MyChart is used to connect with patients for Virtual Visits (Telemedicine).  Patients are able to view lab/test results, encounter notes, upcoming appointments, etc.  Non-urgent messages can be sent to your provider as well.   To learn more about what you can do with MyChart, go to [email or web address].    Your next appointment:   6 month(s)  Provider:   Thomasene Ripple, DO     Other Instructions [facility] to see Dr. Sedalia Muta    Adopting a Healthy Lifestyle.  Know what a healthy weight is for you (roughly BMI <25) and aim to maintain this   Aim for 7+ servings of fruits and vegetables daily   65-80+ fluid ounces of water or unsweet tea for healthy kidneys   Limit to max 1 drink of alcohol per day; avoid smoking/tobacco   Limit animal fats in diet for cholesterol and heart health - choose grass fed whenever available   Avoid highly processed foods, and foods high in saturated/trans fats   Aim for low stress - take time to unwind and care for your mental health   Aim for 150 min of moderate intensity exercise weekly for heart health, and weights twice weekly for bone health   Aim for 7-9 hours of sleep daily  When it comes to diets, agreement about the perfect plan isnt easy to find, even among the experts. Experts at the [facility] [employer] developed an idea known as the Healthy Eating Plate. Just imagine a plate divided into logical, healthy portions.   The emphasis is on diet quality:   Load up on vegetables and fruits - one-half of your plate: Aim for color and variety, and remember that potatoes dont count.   Go for whole grains - one-quarter of your plate: Whole wheat, barley, wheat  berries, quinoa, oats, brown rice, and foods made with them. If you want pasta, go with whole wheat pasta.   Protein power - one-quarter of your plate: Fish, chicken, beans, and nuts are all healthy, versatile protein sources. Limit red meat.   The diet, however, does go beyond the plate, offering a few other suggestions.   Use healthy plant oils, such as olive, canola, soy, corn, sunflower and peanut. Check the labels, and avoid partially hydrogenated oil, which have unhealthy trans fats.   If youre thirsty, drink water. Coffee and tea are good in moderation, but skip sugary drinks and limit milk and dairy products to one or two daily servings.   The type of carbohydrate in the diet is more important than the amount. Some sources of carbohydrates, such as vegetables, fruits, whole grains, and beans-are healthier than others.   Finally, stay active  Signed, Thomasene Ripple, DO  06/16/2023 2:29 PM    [facility]

## 2023-06-16 NOTE — Patient Instructions (Addendum)
Medication Instructions:  Your physician recommends that you continue on your current medications as directed. Please refer to the Current Medication list given to you today.  *If you need a refill on your cardiac medications before your next appointment, please call your pharmacy*   Lab Work: CBC  BMET MAGNESIUM  If you have labs (blood work) drawn today and your tests are completely normal, you will receive your results only by: MyChart Message (if you have MyChart) OR A paper copy in the mail If you have any lab test that is abnormal or we need to change your treatment, we will call you to review the results.   Testing/Procedures: Your physician has requested that you have an echocardiogram. Echocardiography is a painless test that uses sound waves to create images of your heart. It provides your doctor with information about the size and shape of your heart and how well your heart's chambers and valves are working. This procedure takes approximately one hour. There are no restrictions for this procedure. Please do NOT wear cologne, perfume, aftershave, or lotions (deodorant is allowed). Please arrive 15 minutes prior to your appointment time.    Follow-Up: At [facility], you and your health needs are our priority.  As part of our continuing mission to provide you with exceptional heart care, we have created designated Provider Care Teams.  These Care Teams include your primary Cardiologist (physician) and Advanced Practice Providers (APPs -  Physician Assistants and Nurse Practitioners) who all work together to provide you with the care you need, when you need it.  We recommend signing up for the patient portal called "MyChart".  Sign up information is provided on this After Visit Summary.  MyChart is used to connect with patients for Virtual Visits (Telemedicine).  Patients are able to view lab/test results, encounter notes, upcoming appointments, etc.  Non-urgent messages  can be sent to your provider as well.   To learn more about what you can do with MyChart, go to [email or web address].    Your next appointment:   6 month(s)  Provider:   Thomasene Ripple, DO     Other Instructions [facility] to see Dr. Sedalia Muta

## 2023-06-17 LAB — BASIC METABOLIC PANEL
BUN/Creatinine Ratio: 14 (ref 12–28)
BUN: 16 mg/dL (ref 8–27)
CO2: 22 mmol/L (ref 20–29)
Calcium: 9.3 mg/dL (ref 8.7–10.3)
Chloride: 105 mmol/L (ref 96–106)
Creatinine, Ser: 1.14 mg/dL — ABNORMAL HIGH (ref 0.57–1.00)
Glucose: 251 mg/dL — ABNORMAL HIGH (ref 70–99)
Potassium: 4.2 mmol/L (ref 3.5–5.2)
Sodium: 142 mmol/L (ref 134–144)
eGFR: 51 mL/min/{1.73_m2} — ABNORMAL LOW (ref >59)

## 2023-06-17 LAB — CBC WITH DIFFERENTIAL
Basophils Absolute: 0 10*3/uL (ref 0.0–0.2)
Basos: 0 %
EOS (ABSOLUTE): 0.2 10*3/uL (ref 0.0–0.4)
Eos: 2 %
Hematocrit: 39.3 % (ref 34.0–46.6)
Hemoglobin: 12.8 g/dL (ref 11.1–15.9)
Immature Grans (Abs): 0 10*3/uL (ref 0.0–0.1)
Immature Granulocytes: 0 %
Lymphocytes Absolute: 2 10*3/uL (ref 0.7–3.1)
Lymphs: 29 %
MCH: 29 pg (ref 26.6–33.0)
MCHC: 32.6 g/dL (ref 31.5–35.7)
MCV: 89 fL (ref 79–97)
Monocytes Absolute: 0.5 10*3/uL (ref 0.1–0.9)
Monocytes: 7 %
Neutrophils Absolute: 4.4 10*3/uL (ref 1.4–7.0)
Neutrophils: 62 %
RBC: 4.41 x10E6/uL (ref 3.77–5.28)
RDW: 13.5 % (ref 11.7–15.4)
WBC: 7.2 10*3/uL (ref 3.4–10.8)

## 2023-06-24 NOTE — Progress Notes (Signed)
[facility]  [address] [place],  [place]  [address] [phone]   Clinic Day:  06/25/2023  Referring physician: Hadley Pen, MD  HISTORY OF PRESENT ILLNESS:  The patient is a 72 y.o. 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  She comes in today to reassess her iron and hemoglobin levels.  She last received IV iron in October 2023.  Since her last visit, the patient has been doing well.  She has had some fatigue, but denies having any overt forms of blood loss which concern her for progressive anemia.    PHYSICAL EXAM:  Blood pressure (!) 118/57, pulse 91, temperature (!) 97.5 F (36.4 C), resp. rate 16, height 5\' 5"  (1.651 m), weight 204 lb 1.6 oz (92.6 kg), SpO2 95%. Wt Readings from Last 3 Encounters:  06/25/23 204 lb 1.6 oz (92.6 kg)  06/16/23 203 lb (92.1 kg)  05/01/23 203 lb 6 oz (92.3 kg)   Body mass index is 33.96 kg/m. Performance status (ECOG): 1 - Symptomatic but completely ambulatory Physical Exam Constitutional:      General: She is not in acute distress.    Appearance: Normal appearance. She is normal weight.  HENT:     Head: Normocephalic and atraumatic.  Eyes:     General: No scleral icterus.    Extraocular Movements: Extraocular movements intact.     Conjunctiva/sclera: Conjunctivae normal.     Pupils: Pupils are equal, round, and reactive to light.  Cardiovascular:     Rate and Rhythm: Normal rate and regular rhythm.     Pulses: Normal pulses.     Heart sounds: Normal heart sounds. No murmur heard.    No friction rub. No gallop.  Pulmonary:     Effort: Pulmonary effort is normal. No respiratory distress.     Breath sounds: Normal breath sounds.  Abdominal:     General: Bowel sounds are normal. There is no distension.     Palpations: Abdomen is soft. There is no hepatomegaly, splenomegaly or mass.     Tenderness: There is no abdominal tenderness.  Musculoskeletal:         General: Normal range of motion.     Cervical back: Normal range of motion and neck supple.     Right lower leg: No edema.     Left lower leg: No edema.  Lymphadenopathy:     Cervical: No cervical adenopathy.  Skin:    General: Skin is warm and dry.  Neurological:     General: No focal deficit present.     Mental Status: She is alert and oriented to person, place, and time. Mental status is at baseline.  Psychiatric:        Mood and Affect: Mood normal.        Behavior: Behavior normal.        Thought Content: Thought content normal.        Judgment: Judgment normal.    LABS:       Latest Ref Rng & Units 06/25/2023   12:00 AM 06/16/2023    2:41 PM 12/25/2022   12:00 AM  CBC  WBC  7.4     7.2  6.6      Hemoglobin 12.0 - 16.0 11.9     12.8  13.3      Hematocrit 36 - 46 36     39.3  40      Platelets 150 - 400 K/uL 285  281         This result is from an external source.    Latest Reference Range & Units 06/25/23 14:25  Iron 28 - 170 ug/dL 66  UIBC ug/dL 098  TIBC 119 - 147 ug/dL 829  Saturation Ratios 10.4 - 31.8 % 17  Ferritin 11 - 307 ng/mL 10 (L)  (L): Data is abnormally low  ASSESSMENT & PLAN:  Assessment/Plan:  A 72 y.o. female with anemia secondary to iron deficiency and previous renal insufficiency.  Her hemoglobin is slightly lower than what it has been previously.  Furthermore, her iron parameters show a low ferritin.  Based upon this, I will arrange for her to receive IV iron over these next few weeks to rapidly replenish her iron stores and bolster her hemoglobin.  I will see her back in 3 months to see how well she has responded to her IV iron.  The patient understands all the plans discussed today and is in agreement with them.  Ari Bernabei Kirby Funk, MD

## 2023-06-25 ENCOUNTER — Inpatient Hospital Stay: Payer: Medicare Other

## 2023-06-25 ENCOUNTER — Other Ambulatory Visit: Payer: Self-pay | Admitting: Oncology

## 2023-06-25 ENCOUNTER — Inpatient Hospital Stay: Payer: Medicare Other | Attending: Oncology | Admitting: Oncology

## 2023-06-25 VITALS — BP 118/57 | HR 91 | Temp 97.5°F | Resp 16 | Ht 65.0 in | Wt 204.1 lb

## 2023-06-25 DIAGNOSIS — D508 Other iron deficiency anemias: Secondary | ICD-10-CM

## 2023-06-25 DIAGNOSIS — D509 Iron deficiency anemia, unspecified: Secondary | ICD-10-CM | POA: Insufficient documentation

## 2023-06-25 LAB — CMP (CANCER CENTER ONLY)
ALT: 13 U/L (ref 0–44)
AST: 18 U/L (ref 15–41)
Albumin: 3.7 g/dL (ref 3.5–5.0)
Alkaline Phosphatase: 69 U/L (ref 38–126)
Anion gap: 11 (ref 5–15)
BUN: 22 mg/dL (ref 8–23)
CO2: 26 mmol/L (ref 22–32)
Calcium: 8.9 mg/dL (ref 8.9–10.3)
Chloride: 100 mmol/L (ref 98–111)
Creatinine: 1.45 mg/dL — ABNORMAL HIGH (ref 0.44–1.00)
GFR, Estimated: 38 mL/min — ABNORMAL LOW (ref >60)
Glucose, Bld: 272 mg/dL — ABNORMAL HIGH (ref 70–99)
Potassium: 4 mmol/L (ref 3.5–5.1)
Sodium: 137 mmol/L (ref 135–145)
Total Bilirubin: 0.4 mg/dL (ref 0.3–1.2)
Total Protein: 7.4 g/dL (ref 6.5–8.1)

## 2023-06-25 LAB — CBC AND DIFFERENTIAL
HCT: 36 (ref 36–46)
Hemoglobin: 11.9 — AB (ref 12.0–16.0)
Neutrophils Absolute: 4.37
Platelets: 285 10*3/uL (ref 150–400)
WBC: 7.4

## 2023-06-25 LAB — CBC: RBC: 4.09 (ref 3.87–5.11)

## 2023-06-25 LAB — IRON AND TIBC
Iron: 66 ug/dL (ref 28–170)
Saturation Ratios: 17 % (ref 10.4–31.8)
TIBC: 399 ug/dL (ref 250–450)
UIBC: 333 ug/dL

## 2023-06-25 LAB — FERRITIN: Ferritin: 10 ng/mL — ABNORMAL LOW (ref 11–307)

## 2023-06-26 ENCOUNTER — Telehealth: Payer: Self-pay

## 2023-06-26 ENCOUNTER — Encounter: Payer: Self-pay | Admitting: Oncology

## 2023-06-26 NOTE — Telephone Encounter (Signed)
Patient had called about lab results and any possible treatment, Patient will need IV iron per Dr Melvyn Neth and scheduling will call patient to get this scheduled.

## 2023-06-28 ENCOUNTER — Encounter: Payer: Self-pay | Admitting: Oncology

## 2023-06-28 ENCOUNTER — Other Ambulatory Visit: Payer: Self-pay | Admitting: Oncology

## 2023-06-28 DIAGNOSIS — D508 Other iron deficiency anemias: Secondary | ICD-10-CM

## 2023-06-29 MED FILL — Ferumoxytol Inj 510 MG/17ML (30 MG/ML) (Elemental Fe): INTRAVENOUS | Qty: 17 | Status: AC

## 2023-06-30 ENCOUNTER — Inpatient Hospital Stay: Payer: Medicare Other

## 2023-06-30 ENCOUNTER — Other Ambulatory Visit: Payer: Self-pay | Admitting: Cardiology

## 2023-06-30 VITALS — BP 102/56 | HR 83 | Temp 98.2°F | Resp 20 | Ht 65.0 in | Wt 202.0 lb

## 2023-06-30 DIAGNOSIS — D508 Other iron deficiency anemias: Secondary | ICD-10-CM

## 2023-06-30 DIAGNOSIS — D509 Iron deficiency anemia, unspecified: Secondary | ICD-10-CM | POA: Diagnosis not present

## 2023-06-30 MED ORDER — SODIUM CHLORIDE 0.9 % IV SOLN
510.0000 mg | Freq: Once | INTRAVENOUS | Status: AC
  Administered 2023-06-30: 510 mg via INTRAVENOUS
  Filled 2023-06-30: qty 510

## 2023-06-30 MED ORDER — SODIUM CHLORIDE 0.9 % IV SOLN: Freq: Once | INTRAVENOUS | Status: AC

## 2023-06-30 NOTE — Patient Instructions (Signed)
Ferumoxytol Injection What is this medication? FERUMOXYTOL (FER ue MOX i tol) treats low levels of iron in your body (iron deficiency anemia). Iron is a mineral that plays an important role in making red blood cells, which carry oxygen from your lungs to the rest of your body. This medicine may be used for other purposes; ask your health care provider or pharmacist if you have questions. COMMON BRAND NAME(S): Feraheme What should I tell my care team before I take this medication? They need to know if you have any of these conditions: Anemia not caused by low iron levels High levels of iron in the blood Magnetic resonance imaging (MRI) test scheduled An unusual or allergic reaction to iron, other medications, foods, dyes, or preservatives Pregnant or trying to get pregnant Breastfeeding How should I use this medication? This medication is injected into a vein. It is given by your care team in a hospital or clinic setting. Talk to your care team the use of this medication in children. Special care may be needed. Overdosage: If you think you have taken too much of this medicine contact a poison control center or emergency room at once. NOTE: This medicine is only for you. Do not share this medicine with others. What if I miss a dose? It is important not to miss your dose. Call your care team if you are unable to keep an appointment. What may interact with this medication? Other iron products This list may not describe all possible interactions. Give your health care provider a list of all the medicines, herbs, non-prescription drugs, or dietary supplements you use. Also tell them if you smoke, drink alcohol, or use illegal drugs. Some items may interact with your medicine. What should I watch for while using this medication? Visit your care team regularly. Tell your care team if your symptoms do not start to get better or if they get worse. You may need blood work done while you are taking this  medication. You may need to follow a special diet. Talk to your care team. Foods that contain iron include: whole grains/cereals, dried fruits, beans, or peas, leafy green vegetables, and organ meats (liver, kidney). What side effects may I notice from receiving this medication? Side effects that you should report to your care team as soon as possible: Allergic reactions--skin rash, itching, hives, swelling of the face, lips, tongue, or throat Low blood pressure--dizziness, feeling faint or lightheaded, blurry vision Shortness of breath Side effects that usually do not require medical attention (report to your care team if they continue or are bothersome): Flushing Headache Joint pain Muscle pain Nausea Pain, redness, or irritation at injection site This list may not describe all possible side effects. Call your doctor for medical advice about side effects. You may report side effects to FDA at 1-800-FDA-1088. Where should I keep my medication? This medication is given in a hospital or clinic. It will not be stored at home. NOTE: This sheet is a summary. It may not cover all possible information. If you have questions about this medicine, talk to your doctor, pharmacist, or health care provider.  2024 Elsevier/Gold Standard (2023-04-24 00:00:00)

## 2023-07-01 ENCOUNTER — Telehealth: Payer: Self-pay | Admitting: Cardiology

## 2023-07-01 NOTE — Telephone Encounter (Signed)
*  STAT* If patient is at the pharmacy, call can be transferred to refill team.   1. Which medications need to be refilled? (please list name of each medication and dose if known) dapagliflozin propanediol (FARXIGA) 10 MG TABS tablet   2. Which pharmacy/location (including street and city if local pharmacy) is medication to be sent to?  [facility] [place], [place]    3. Do they need a 30 day or 90 day supply? [place]

## 2023-07-02 HISTORY — PX: RADIOFREQUENCY ABLATION: SHX2290

## 2023-07-02 NOTE — Telephone Encounter (Signed)
Medication already refilled.

## 2023-07-03 ENCOUNTER — Ambulatory Visit: Payer: Medicare Other

## 2023-07-03 DIAGNOSIS — I255 Ischemic cardiomyopathy: Secondary | ICD-10-CM

## 2023-07-03 LAB — CUP PACEART REMOTE DEVICE CHECK
Battery Remaining Longevity: 90 mo
Battery Remaining Percentage: 100 %
Brady Statistic RA Percent Paced: 0 %
Brady Statistic RV Percent Paced: 100 %
Date Time Interrogation Session: 20240802031100
HighPow Impedance: 79 Ohm
Implantable Lead Connection Status: 753985
Implantable Lead Connection Status: 753985
Implantable Lead Connection Status: 753985
Implantable Lead Implant Date: 20220131
Implantable Lead Implant Date: 20220131
Implantable Lead Implant Date: 20220131
Implantable Lead Location: 753858
Implantable Lead Location: 753859
Implantable Lead Location: 753860
Implantable Lead Model: 137
Implantable Lead Model: 3830
Implantable Lead Model: 7840
Implantable Lead Serial Number: 1047234
Implantable Lead Serial Number: 301179
Implantable Pulse Generator Implant Date: 20220131
Lead Channel Impedance Value: 541 Ohm
Lead Channel Impedance Value: 547 Ohm
Lead Channel Impedance Value: 714 Ohm
Lead Channel Setting Pacing Amplitude: 0.1 V
Lead Channel Setting Pacing Amplitude: 3.5 V
Lead Channel Setting Pacing Amplitude: 3.5 V
Lead Channel Setting Pacing Pulse Width: 0.1 ms
Lead Channel Setting Pacing Pulse Width: 0.4 ms
Lead Channel Setting Sensing Sensitivity: 0.5 mV
Lead Channel Setting Sensing Sensitivity: 1 mV
Pulse Gen Serial Number: 149649
Zone Setting Status: 755011

## 2023-07-06 MED FILL — Ferumoxytol Inj 510 MG/17ML (30 MG/ML) (Elemental Fe): INTRAVENOUS | Qty: 17 | Status: AC

## 2023-07-07 ENCOUNTER — Inpatient Hospital Stay: Payer: Medicare Other | Attending: Oncology

## 2023-07-07 VITALS — BP 102/50 | HR 77 | Temp 98.0°F | Resp 20 | Ht 65.0 in | Wt 205.1 lb

## 2023-07-07 DIAGNOSIS — D509 Iron deficiency anemia, unspecified: Secondary | ICD-10-CM | POA: Insufficient documentation

## 2023-07-07 DIAGNOSIS — D508 Other iron deficiency anemias: Secondary | ICD-10-CM

## 2023-07-07 MED ORDER — SODIUM CHLORIDE 0.9 % IV SOLN: Freq: Once | INTRAVENOUS | Status: AC

## 2023-07-07 MED ORDER — SODIUM CHLORIDE 0.9 % IV SOLN
510.0000 mg | Freq: Once | INTRAVENOUS | Status: AC
  Administered 2023-07-07: 510 mg via INTRAVENOUS
  Filled 2023-07-07: qty 510

## 2023-07-07 NOTE — Patient Instructions (Signed)
Ferumoxytol Injection What is this medication? FERUMOXYTOL (FER ue MOX i tol) treats low levels of iron in your body (iron deficiency anemia). Iron is a mineral that plays an important role in making red blood cells, which carry oxygen from your lungs to the rest of your body. This medicine may be used for other purposes; ask your health care provider or pharmacist if you have questions. COMMON BRAND NAME(S): Feraheme What should I tell my care team before I take this medication? They need to know if you have any of these conditions: Anemia not caused by low iron levels High levels of iron in the blood Magnetic resonance imaging (MRI) test scheduled An unusual or allergic reaction to iron, other medications, foods, dyes, or preservatives Pregnant or trying to get pregnant Breastfeeding How should I use this medication? This medication is injected into a vein. It is given by your care team in a hospital or clinic setting. Talk to your care team the use of this medication in children. Special care may be needed. Overdosage: If you think you have taken too much of this medicine contact a poison control center or emergency room at once. NOTE: This medicine is only for you. Do not share this medicine with others. What if I miss a dose? It is important not to miss your dose. Call your care team if you are unable to keep an appointment. What may interact with this medication? Other iron products This list may not describe all possible interactions. Give your health care provider a list of all the medicines, herbs, non-prescription drugs, or dietary supplements you use. Also tell them if you smoke, drink alcohol, or use illegal drugs. Some items may interact with your medicine. What should I watch for while using this medication? Visit your care team regularly. Tell your care team if your symptoms do not start to get better or if they get worse. You may need blood work done while you are taking this  medication. You may need to follow a special diet. Talk to your care team. Foods that contain iron include: whole grains/cereals, dried fruits, beans, or peas, leafy green vegetables, and organ meats (liver, kidney). What side effects may I notice from receiving this medication? Side effects that you should report to your care team as soon as possible: Allergic reactions--skin rash, itching, hives, swelling of the face, lips, tongue, or throat Low blood pressure--dizziness, feeling faint or lightheaded, blurry vision Shortness of breath Side effects that usually do not require medical attention (report to your care team if they continue or are bothersome): Flushing Headache Joint pain Muscle pain Nausea Pain, redness, or irritation at injection site This list may not describe all possible side effects. Call your doctor for medical advice about side effects. You may report side effects to FDA at 1-800-FDA-1088. Where should I keep my medication? This medication is given in a hospital or clinic. It will not be stored at home. NOTE: This sheet is a summary. It may not cover all possible information. If you have questions about this medicine, talk to your doctor, pharmacist, or health care provider.  2024 Elsevier/Gold Standard (2023-04-24 00:00:00)

## 2023-07-10 ENCOUNTER — Ambulatory Visit: Payer: Medicare Other | Attending: Cardiology

## 2023-07-10 DIAGNOSIS — I255 Ischemic cardiomyopathy: Secondary | ICD-10-CM | POA: Diagnosis present

## 2023-07-10 LAB — ECHOCARDIOGRAM COMPLETE
Area-P 1/2: 4.39 cm2
Est EF: <20
S' Lateral: 5.9 cm

## 2023-07-10 MED ORDER — PERFLUTREN LIPID MICROSPHERE
1.0000 mL | INTRAVENOUS | Status: AC | PRN
  Administered 2023-07-10: 10 mL via INTRAVENOUS

## 2023-07-13 ENCOUNTER — Other Ambulatory Visit: Payer: Self-pay

## 2023-07-13 DIAGNOSIS — R931 Abnormal findings on diagnostic imaging of heart and coronary circulation: Secondary | ICD-10-CM

## 2023-07-13 NOTE — Progress Notes (Signed)
Remote ICD transmission.   

## 2023-07-28 ENCOUNTER — Encounter: Payer: Self-pay | Admitting: Oncology

## 2023-07-30 ENCOUNTER — Encounter: Payer: Self-pay | Admitting: Physician Assistant

## 2023-07-30 ENCOUNTER — Ambulatory Visit (INDEPENDENT_AMBULATORY_CARE_PROVIDER_SITE_OTHER): Payer: Medicare Other | Admitting: Physician Assistant

## 2023-07-30 VITALS — BP 110/60 | HR 80 | Ht 63.5 in | Wt 205.1 lb

## 2023-07-30 DIAGNOSIS — Z9049 Acquired absence of other specified parts of digestive tract: Secondary | ICD-10-CM

## 2023-07-30 DIAGNOSIS — K5909 Other constipation: Secondary | ICD-10-CM

## 2023-07-30 MED ORDER — TRULANCE 3 MG PO TABS: 3.0000 mg | ORAL_TABLET | Freq: Every day | ORAL | 2 refills | Status: DC

## 2023-07-30 NOTE — Patient Instructions (Addendum)
We have sent the following medications to your pharmacy for you to pick up at your convenience: Trulance  Stop taking Amitiza  You are schedule for a follow up visit on 11/05/23 at 11 am with Hyacinth Meeker PA-C  If your blood pressure at your visit was 140/90 or greater, please contact your primary care physician to follow up on this.  _______________________________________________________  If you are age 73 or older, your body mass index should be between 23-30. Your Body mass index is 35.77 kg/m. If this is out of the aforementioned range listed, please consider follow up with your Primary Care Provider.  If you are age 92 or younger, your body mass index should be between 19-25. Your Body mass index is 35.77 kg/m. If this is out of the aformentioned range listed, please consider follow up with your Primary Care Provider.   ________________________________________________________  The [facility] providers would like to encourage you to use [facility] to communicate with providers for non-urgent requests or questions.  Due to long hold times on the telephone, sending your provider a message by [facility] may be a faster and more efficient way to get a response.  Please allow 48 business hours for a response.  Please remember that this is for non-urgent requests.  _______________________________________________________  Thank you for entrusting me with your care and choosing [facility].  Hyacinth Meeker PA-C

## 2023-07-30 NOTE — Progress Notes (Signed)
Chief Complaint: Follow up constipation  HPI:    Karen Warner is a 72 year old female with a past medical history as listed below including CHF, COPD, CAD status post stent on Plavix, ischemic cardiomyopathy status post biventricular ICD placement 12/2020, IDA with intestinal AVMs, diverticular disease status post left hemicolectomy, epiploic appendicitis, colon polyps, gastritis and multiple others, known to Dr. Lavon Paganini, who returns to clinic today for follow-up of her constipation.    10/07/2019 colonoscopy with diminutive colon polyps, diverticula and anastomosis left colon hemicolectomy at 6 cm.    05/15/2022 patient seen in clinic for evaluation of nausea and lower abdominal pain.  At that time discussed history of small bowel obstruction.  Had a CT which showed 2 transition points likely adhesions and x-ray was ordered and recommended low fiber diet.  Noted that patient was high risk for surgical/endoscopic procedures given CAD status post stents, CHF with AICD and chronic angina.    05/16/2022 abdominal x-ray with no significant colonic stool burden.  No bowel obstruction.  Patient was continued on Linzess.  Discussed trial of Trulance if symptoms continued.    08/21/2022 iron studies showed a ferritin low at 10 and otherwise normal.  B12 normal.  TSH normal.  CBC normal.    10/14/2022 patient seen in clinic for a stomach ache for 5 years.  She was on Linzess 72 mcg daily for a few months but that gave her urgent diarrhea 2 hours after taking it.  Never felt completely emptied out and it caused her a lot of distress.  That time started Amitiza 8 mcg twice a day with food.  Told her to call and let me know how she is doing.    05/01/2023 patient described that her Amitiza 8 mcg twice daily was unpredictable.  She had gone back to using Linzess 72 mcg and it was not working well.  Cajun bright red blood in the toilet paper.  At that time discussed options.  We trialed an increased dose of Amitiza 24 mcg  twice a day.  Discussed possibly Trulance if this was not working.    Today, the patient tells me that she has been using Amitiza 24 mcg once a day but this causes urgent watery diarrhea multiple times it leaves her in the bathroom for a long while, she basically cannot leave the house if she takes this medication.  Tells me due to this she takes it maybe every other day and if she does not take it at all she starts again feeling full up and food feels like it will not go down and she has a lot of abdominal distention and discomfort.  One of her friends has sent her a holistic mineral body cleanse that she supposed to take every day and she is also picked up a natural fiber supplement and wonders if she should use this instead.  Tells me she wants to try and get back to his close as normal as possible.    Denies fever, chills, weight loss or blood in her stool.   Past Medical History:  Diagnosis Date   Anemia 12/12/2019   Arthralgia 01/14/2020   Arthritis    Asthma    Atherosclerotic heart disease of native coronary artery without angina pectoris 04/13/2014   CHF (congestive heart failure) (HCC)    Chronic bilateral low back pain with bilateral sciatica 01/13/2019   Chronic bladder pain 11/08/2014   Last Assessment & Plan:  Formatting of this note might be different from the  original. Patient has chronic pain syndrome in general I think this is unfortunately worsening her pelvic pain and bladder pain her examination today was very reassuring I am advised her to use the estrogen cream I did start her on Elavil 10 milligrams at that time if she does tolerated will increase it to 25 milligrams.    Chronic headaches    Chronic idiopathic constipation 12/22/2014   Formatting of this note might be different from the original. Last Assessment & Plan:  For better bowel emptying please use either Citrucel / Benefiber start with 2 tablespoons daily and titrate up or down to effect.  Also please use glycerin  suppositories as needed to assist in evacuation. Last Assessment & Plan:  Formatting of this note might be different from the original. For better bowel empt   Chronic obstructive pulmonary disease, unspecified (HCC) 03/18/2018   Class 1 obesity due to excess calories with serious comorbidity and body mass index (BMI) of 31.0 to 31.9 in adult 06/26/2020   Colon polyps    COPD (chronic obstructive pulmonary disease) (HCC)    Coronary artery disease    DDD (degenerative disc disease), cervical 01/13/2019   Depression 11/05/2019   Diabetic polyneuropathy associated with type 2 diabetes mellitus (HCC) 02/24/2019   Diverticulitis 05/20/2018   Diverticulitis of colon 03/18/2018   Family history of ischemic heart disease (IHD) 08/16/2013   Fibromyalgia affecting multiple sites 01/14/2020   Gastro-esophageal reflux disease without esophagitis 01/13/2019   Glaucoma 11/08/2014   Hordeolum externum of left upper eyelid 03/13/2020   Hypertension 11/08/2014   IBS (irritable bowel syndrome)    Iron deficiency anemia 01/13/2019   Left renal mass 11/08/2019   Leukocytosis 05/28/2018   Low vitamin B12 level 03/13/2020   Low vitamin D level 12/12/2019   LV dysfunction 05/31/2019   Malignant essential hypertension 01/22/2016   Migraine, unspecified, not intractable, without status migrainosus 11/08/2014   Mixed hyperlipidemia 01/13/2019   Myocardial infarction (HCC)    Other premature beats 11/08/2014   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 Last Assessment & Plan:  For vaginal atrophy please place a pea size dab of Estrogen vaginal  cream  into the vagina 3 times a week ( Monday, Wednesday, Friday) Last Assessment & Plan:  Formatting of this note might be different from the original. For vaginal atrophy please place a pea size dab of Estrogen vaginal cream  into the vagina     Past Surgical History:  Procedure Laterality Date   ABDOMINAL HYSTERECTOMY     BIV ICD INSERTION CRT-D N/A 12/31/2020   Procedure: BIV ICD INSERTION CRT-D;  Surgeon: Marinus Maw, MD;  Location: [facility] INVASIVE CV LAB;  Service: Cardiovascular;  Laterality: N/A;   BLEPHAROPLASTY Bilateral    COLON SURGERY     CORONARY ANGIOPLASTY WITH STENT PLACEMENT  2020   X3   CORONARY PRESSURE/FFR STUDY N/A 08/03/2020   Procedure: INTRAVASCULAR PRESSURE WIRE/FFR STUDY;  Surgeon: Tonny Bollman, MD;  Location: [facility] INVASIVE CV LAB;  Service: Cardiovascular;  Laterality: N/A;   CORONARY STENT INTERVENTION N/A 08/03/2020   Procedure: CORONARY STENT INTERVENTION;  Surgeon: Tonny Bollman, MD;  Location: [facility]  INVASIVE CV LAB;  Service: Cardiovascular;  Laterality: N/A;   LEFT HEART CATH AND CORONARY ANGIOGRAPHY N/A 08/03/2020   Procedure: LEFT HEART CATH AND CORONARY ANGIOGRAPHY;  Surgeon: Tonny Bollman, MD;  Location: [facility] INVASIVE CV LAB;  Service: Cardiovascular;  Laterality: N/A;   REFRACTIVE SURGERY Left    [place]    Current Outpatient Medications  Medication Sig Dispense Refill   acetaminophen (TYLENOL) 325 MG tablet Take 2 tablets by mouth every 6 (six) hours as needed for pain.     ACETAMINOPHEN-BUTALBITAL 50-325 MG TABS Take 1 tablet by mouth every 4 (four) hours as needed for severe pain.     albuterol (PROVENTIL) (2.5 MG/3ML) 0.083% nebulizer solution INL CONTENTS OF 1 VIAL VIA NEBULIZER Q 4 H PRF WHEEZING OR COUGH     APPLE CIDER VINEGAR PO Take 2 tablets by mouth 2 (two) times daily. With digestive detox 1 capsule daily     aspirin 81 MG EC tablet 81 mg daily.     baclofen (LIORESAL) 10 MG tablet Take 10 mg by mouth 3 (three) times daily.     BD PEN  NEEDLE NANO 2ND GEN 32G X 4 MM MISC 3 (three) times daily.     Bromfenac Sodium 0.09 % SOLN Place 1 drop into the left eye daily. (Patient not taking: Reported on 06/16/2023)     carvedilol (COREG) 3.125 MG tablet TAKE 1 TABLET(3.125 MG) BY MOUTH TWICE DAILY WITH A MEAL 180 tablet 3   clopidogrel (PLAVIX) 75 MG tablet TAKE 1 TABLET(75 MG) BY MOUTH DAILY 90 tablet 3   Continuous Glucose Sensor (DEXCOM G7 SENSOR) MISC SMARTSIG:1 Topical Every 10 Days     dapagliflozin propanediol (FARXIGA) 10 MG TABS tablet TAKE 1 TABLET(10 MG) BY MOUTH DAILY BEFORE BREAKFAST 90 tablet 3   diclofenac Sodium (VOLTAREN) 1 % GEL Apply 2 g topically 2 (two) times daily as needed (pain).     dicyclomine (BENTYL) 10 MG capsule TAKE 1 CAPSULE(10 MG) BY MOUTH IN THE MORNING AND AT BEDTIME 180 capsule 1   famotidine (PEPCID) 40 MG tablet TAKE 1 TABLET(40 MG) BY MOUTH TWICE DAILY 60 tablet 2   fluticasone (FLONASE) 50 MCG/ACT nasal spray Place 1 spray into both nostrils as needed for allergies or rhinitis.     folic acid (FOLVITE) 1 MG tablet Take 1 mg by mouth daily.     glycerin adult 2 g suppository Place 1 suppository rectally as needed for constipation. Apply after bowel movements. 12 suppository 0   insulin lispro (HUMALOG) 100 UNIT/ML KwikPen Inject 16 Units into the skin 3 (three) times daily. Administer 16 units three times daily before a meal per sliding scale     ipratropium (ATROVENT) 0.03 % nasal spray Place 2 sprays into the nose in the morning, at noon, and at bedtime.     isosorbide mononitrate (IMDUR) 30 MG 24 hr tablet Take 15 mg by mouth daily.     lidocaine (LIDODERM) 5 % Place 1 patch onto the skin daily. Remove & Discard patch within 12 hours or as directed by MD 5 patch 0   loratadine (CLARITIN) 10 MG tablet Take 10 mg by mouth daily.     lubiprostone (AMITIZA) 24 MCG capsule Take 1 capsule (24 mcg total) by mouth 2 (two) times daily with a meal. 60 capsule 3   nitroGLYCERIN (NITROSTAT) 0.4 MG SL tablet  Place 1 tablet (0.4 mg total) under the tongue every 5 (five) minutes as needed for chest pain. 25 tablet 3  nystatin (MYCOSTATIN) 100000 UNIT/ML suspension Take 5 mLs by mouth as needed.     pantoprazole (PROTONIX) 40 MG tablet TAKE 1 TABLET(40 MG) BY MOUTH DAILY 90 tablet 3   prednisoLONE acetate (PRED FORTE) 1 % ophthalmic suspension Place 1 drop into the left eye in the morning, at noon, in the evening, and at bedtime. (Patient not taking: Reported on 06/16/2023)     pregabalin (LYRICA) 50 MG capsule Take 50 mg by mouth 3 (three) times daily.     ranolazine (RANEXA) 500 MG 12 hr tablet Take 1 tablet (500 mg total) by mouth 2 (two) times daily. 180 tablet 3   rosuvastatin (CRESTOR) 40 MG tablet Take 40 mg by mouth daily.     sacubitril-valsartan (ENTRESTO) 24-26 MG Take 1 tablet by mouth 2 (two) times daily. 60 tablet 4   torsemide (DEMADEX) 20 MG tablet Take 1 tablet (20 mg total) by mouth 2 (two) times daily. 180 tablet 3   TRESIBA FLEXTOUCH 200 UNIT/ML FlexTouch Pen Inject 40 Units into the skin 2 (two) times daily. 40 units in the morning and up to 45 units at night     TRINTELLIX 5 MG TABS tablet Take 5 mg by mouth at bedtime.     Ubrogepant 50 MG TABS Take 1 tablet by mouth as needed.     valACYclovir (VALTREX) 500 MG tablet Take 500 mg by mouth daily.     VERQUVO 5 MG TABS TAKE 1 TABLET BY MOUTH EVERY DAY 90 tablet 3   vitamin B-12 (CYANOCOBALAMIN) 1000 MCG tablet Take 1,000 mcg by mouth daily.     Vitamin D, Ergocalciferol, (DRISDOL) 1.25 MG (50000 UNIT) CAPS capsule Take 50,000 Units by mouth once a week. sundays     No current facility-administered medications for this visit.    Allergies as of 07/30/2023 - Review Complete 07/07/2023  Allergen Reaction Noted   Bee venom Anaphylaxis 02/01/2018   Sumatriptan Other (See Comments) and Shortness Of Breath 11/02/2020   Tramadol Hives 05/06/2019   Amoxicillin-pot clavulanate Diarrhea, Nausea And Vomiting, and Other (See Comments)  11/08/2014   Oxycodone Itching and Hives 11/08/2014   Buprenorphine hcl  04/19/2012   Clarithromycin  04/19/2012   Duloxetine hcl  04/19/2012   Hydrocodone  04/19/2012   Lactose  05/26/2018   Liraglutide  04/19/2012   Tramadol hcl  04/19/2012    Family History  Problem Relation Age of Onset   Lung cancer Mother    Coronary artery disease Mother    Diabetes Mother    Glaucoma Mother    Hypertension Mother    Colon polyps Mother    Migraines Father    Heart disease Father    Diabetes Maternal Grandmother    Diabetes Daughter    Migraines Son    Hypertension Brother     Social History   Socioeconomic History   Marital status: Significant Other    Spouse name: Not on file   Number of children: 3   Years of education: Not on file   Highest education level: Not on file  Occupational History   Occupation: retired  Tobacco Use   Smoking status: Former    Current packs/day: 0.00    Types: Cigarettes    Quit date: 2021    Years since quitting: 3.6   Smokeless tobacco: Never  Vaping Use   Vaping status: Never Used  Substance and Sexual Activity   Alcohol use: Never   Drug use: Never   Sexual activity: Not on file  Other Topics Concern   Not on file  Social History Narrative   Not on file   Social Determinants of Health   Financial Resource Strain: Not on file  Food Insecurity: Not on file  Transportation Needs: Not on file  Physical Activity: Not on file  Stress: Not on file  Social Connections: Not on file  Intimate Partner Violence: Not on file    Review of Systems:    Constitutional: No weight loss, fever or chills Cardiovascular: No chest pain Respiratory: No SOB  Gastrointestinal: See HPI and otherwise negative   Physical Exam:  Vital signs: BP 110/60 (BP Location: Left Arm, Patient Position: Sitting, Cuff Size: Large)   Pulse 80   Ht 5' 3.5" (1.613 m)   Wt 205 lb 2 oz (93 kg)   BMI 35.77 kg/m    Constitutional:   Pleasant obese female  appears to be in NAD, Well developed, Well nourished, alert and cooperative Respiratory: Respirations even and unlabored. Lungs clear to auscultation bilaterally.   No wheezes, crackles, or rhonchi.  Cardiovascular: Normal S1, S2. No MRG. Regular rate and rhythm. No peripheral edema, cyanosis or pallor.  Gastrointestinal:  Soft, nondistended, nontender. No rebound or guarding. Normal bowel sounds. No appreciable masses or hepatomegaly. Rectal:  Not performed.  Psychiatric: Demonstrates good judgement and reason without abnormal affect or behaviors.  RELEVANT LABS AND IMAGING: CBC    Component Value Date/Time   WBC 7.4 06/25/2023 0000   WBC 7.9 12/30/2020 0021   RBC 4.09 06/25/2023 0000   HGB 11.9 (A) 06/25/2023 0000   HGB 12.8 06/16/2023 1441   HCT 36 06/25/2023 0000   HCT 39.3 06/16/2023 1441   PLT 285 06/25/2023 0000   MCV 89 06/16/2023 1441   MCV 86 02/14/2021 0000   MCH 29.0 06/16/2023 1441   MCH 27.0 12/30/2020 0021   MCHC 32.6 06/16/2023 1441   MCHC 29.9 (L) 12/30/2020 0021   RDW 13.5 06/16/2023 1441   LYMPHSABS 2.0 06/16/2023 1441   EOSABS 0.2 06/16/2023 1441   BASOSABS 0.0 06/16/2023 1441    CMP     Component Value Date/Time   NA 137 06/25/2023 1424   NA 142 06/16/2023 1441   K 4.0 06/25/2023 1424   CL 100 06/25/2023 1424   CO2 26 06/25/2023 1424   GLUCOSE 272 (H) 06/25/2023 1424   BUN 22 06/25/2023 1424   BUN 16 06/16/2023 1441   CREATININE 1.45 (H) 06/25/2023 1424   CALCIUM 8.9 06/25/2023 1424   PROT 7.4 06/25/2023 1424   PROT 7.4 02/26/2023 1234   ALBUMIN 3.7 06/25/2023 1424   ALBUMIN 4.1 02/26/2023 1234   AST 18 06/25/2023 1424   ALT 13 06/25/2023 1424   ALKPHOS 69 06/25/2023 1424   BILITOT 0.4 06/25/2023 1424   GFRNONAA 38 (L) 06/25/2023 1424   GFRAA 45 (L) 01/16/2021 1226    Assessment: 1.  Chronic constipation: Amitiza 8 mcg twice daily was not strong enough, Linzess 72 mcg too strong, and Amitiza 24 mcg once daily too strong, last colonoscopy  was normal in November 2020; thought related to IBS/slow transit 2.  History of small bowel obstruction with history of partial colectomy and hysterectomy in the past 3.  Significant cardiac history: CAD post stents on Plavix, CHF with AICD and chronic angina  Plan: 1.  At this point we will trial Trulance 3 mg daily. 2.  Discontinue Amitiza 3.  Briefly discussed natural fiber supplement and cleanser, explained that it looks like most of the ingredients  on the natural laxatives are just fiber and if she wants to give this a try she can.  I am not sure about the cleanser, the main ingredient is something I am unfamiliar with.  She can try this at her own risk. 4.  Patient to follow in clinic with me in 2 months.  Asked her to check in before then to let us know how she is doing so we could try different product if needed.  Hyacinth Meeker, PA-C [facility] 07/30/2023, 10:42 AM  Cc: Hadley Pen, MD

## 2023-08-12 ENCOUNTER — Telehealth: Payer: Self-pay | Admitting: Cardiology

## 2023-08-12 NOTE — Telephone Encounter (Signed)
Patient states not heard from the [facility]. She is unsure what she is to do next.  Advised would have them reach out to her to schedule.

## 2023-08-12 NOTE — Telephone Encounter (Signed)
Pt called to go over echo results or her next step via phone because she is unable to get in [place]

## 2023-08-14 ENCOUNTER — Other Ambulatory Visit: Payer: Self-pay | Admitting: Cardiology

## 2023-08-17 ENCOUNTER — Telehealth: Payer: Self-pay | Admitting: Cardiology

## 2023-08-17 NOTE — Telephone Encounter (Signed)
Patient is requesting to speak Leavy Cella, RN only in regards to follow-up care. She states she does not want to speak to anyone else and did not give further information.

## 2023-08-18 NOTE — Telephone Encounter (Signed)
Matter addressed in another message. See chart.

## 2023-08-18 NOTE — Telephone Encounter (Signed)
Called pt. Let pt know I reached out to a staff member at the [facility] to look into getting her scheduled. I also reached out to a staff member who handles referrals to understand what phase the referral is currently at. After reassuring pt she is thankful for the call. Pt aware she is now waiting to be scheduled. No further questions at this time.

## 2023-08-20 ENCOUNTER — Telehealth (HOSPITAL_COMMUNITY): Payer: Self-pay | Admitting: Cardiology

## 2023-08-22 ENCOUNTER — Other Ambulatory Visit: Payer: Self-pay | Admitting: Cardiology

## 2023-08-24 ENCOUNTER — Encounter: Payer: Self-pay | Admitting: Oncology

## 2023-09-10 ENCOUNTER — Telehealth: Payer: Self-pay | Admitting: Physician Assistant

## 2023-09-10 NOTE — Telephone Encounter (Signed)
Called patient in reference to complaints of having diarrhea that she believes stems from the medication Trulance. Patient states she believes the medication is too strong as well as the other medications she has tried in the past, the Linzess, and the Amitiza. Patient states she is still having diarrhea, heartburn, bloating and a feeling of choking. Patient also states she saw her PCP last week after having gone to the ED and was told she could have a bowel blockage. CT scan was done and no bowel blockage was noted as stated per patient. Patient is diagnosed with CHF, has a cardiologist and is scheduled for a heart failure clinic appt on 09/29/23. Informed the patient at present I could only recommend anti- diarrheal OTC medications such as Pept- Bismol, Keopectate, and Imodium. Also informed the patient about fiberous foods like apples, bananas, and legumes to help treat the symptoms, Also recommended Gas-x for complaints of bloating. Patient states her cardiologist informed her the ejection fraction is less than 20%. Please advise.

## 2023-09-10 NOTE — Telephone Encounter (Signed)
Inbound call from patient, states medication Trulance is now causing her to have diarrhea. States if she does not take the medication she becomes bloated and has abdominal pain. States she went to Urgent Care and to the Emergency room at [facility], and has seen no improvement. States medication is too strong for her and is in pain and would like to discuss symptoms and further plan of care with a nurse. Please advise.

## 2023-09-10 NOTE — Telephone Encounter (Signed)
Called patient to inform of next available appt with Dr. Lavon Paganini. Patient states " I appreciate your calling me back, but I will not wait until January to get some relief." Call ended.

## 2023-09-11 ENCOUNTER — Encounter: Payer: Self-pay | Admitting: Nurse Practitioner

## 2023-09-11 ENCOUNTER — Ambulatory Visit: Payer: Medicare Other | Admitting: Nurse Practitioner

## 2023-09-11 VITALS — BP 114/68 | HR 81 | Ht 65.0 in | Wt 205.6 lb

## 2023-09-11 DIAGNOSIS — K5909 Other constipation: Secondary | ICD-10-CM | POA: Diagnosis not present

## 2023-09-11 NOTE — Telephone Encounter (Signed)
Called patient to make aware of the available appt today at 3pm. Patient agreed to take the appt. Scheduled with Ms.Paula Guenther,NP. Patient continues to complain about diarrhea, abdominal pain and bloating. She has tried various medications for her constipation, including Linzess, Armitiza and at present Trulance. Patient states these medications are too strong and she continues to have diarrhea. Patient also is a cardiac patient.

## 2023-09-11 NOTE — Progress Notes (Signed)
ASSESSMENT    Brief Narrative:  72 y.o.  female known to Dr. Lavon Paganini.  She has multiple medical problems not limited CHF, COPD, CAD status post stent on Plavix, ischemic cardiomyopathy status post biventricular ICD placement,  IDA with intestinal AVMs, diverticular disease status post left hemicolectomy, epiploic appendicitis, colon polyps, gastritis   Chronic constipation. Has been difficult to treat.  Multiple medications have been tried and either do not work or cause diarrhea.  Based on history and exam today she may have pelvic floor dysfunction.   See PMH below for additional history  PLAN   --We discussed pelvic floor physical therapy.  She is open to evaluation and treatment.  I will make the referral -- For now,  recommend she take Trulance every other day  HPI   Chief complaint : Diarrhea from Trulance  Patient has been seen several times in office by multiple providers. She has chronic abdominal bloating, chronically altered bowel habits. Last seen by Hyacinth Meeker, PA late August 2024.  She has tried multiple medications for constipation but they either do not work or cause almost instant diarrhea. Previous therapies have included probiotics, miralax, Benefiber, Amitiza, Linzess.  Recently started on Trulance. Called office yesterday with complaints of diarrhea.  Hyacinth Meeker, PA recommend she follow-up with Dr. Lavon Paganini.  Patient did not want to wait and was given an appointment to see  me.   She gets diarrhea within a couple hours of taking Trulance, Linzess or Amitiza.  However, out of all the medications Trulance does seem to cause the least amount of diarrhea.  She does feel much better after taking a dose because it helps her pass a lot of gas and helps the abdominal bloating.   Analyssa tells me that she gets the urge to have a bowel movement almost every day.  Her problem seems to be that she cannot evacuate her rectum.  If she is not taking something for  constipation then her stools are hard.  It seems her stools have to be nearly liquid form for easy passage     Latest Ref Rng & Units 06/25/2023    2:24 PM 02/26/2023   12:34 PM 12/25/2022   12:00 AM  Hepatic Function  Total Protein 6.5 - 8.1 g/dL 7.4  7.4    Albumin 3.5 - 5.0 g/dL 3.7  4.1  4.0      AST 15 - 41 U/L 18  17  25       ALT 0 - 44 U/L 13  11  17       Alk Phosphatase 38 - 126 U/L 69  96  76      Total Bilirubin 0.3 - 1.2 mg/dL 0.4  0.3       This result is from an external source.     Past Medical History:  Diagnosis Date   Anemia 12/12/2019   Arthralgia 01/14/2020   Arthritis    Asthma    Atherosclerotic heart disease of native coronary artery without angina pectoris 04/13/2014   CHF (congestive heart failure) (HCC)    Chronic bilateral low back pain with bilateral sciatica 01/13/2019   Chronic bladder pain 11/08/2014   Last Assessment & Plan:  Formatting of this note might be different from the original. Patient has chronic pain syndrome in general I think this is unfortunately worsening her pelvic pain and bladder pain her examination today was very reassuring I am advised her to use the estrogen cream I did start her  on Elavil 10 milligrams at that time if she does tolerated will increase it to 25 milligrams.    Chronic headaches    Chronic idiopathic constipation 12/22/2014   Formatting of this note might be different from the original. Last Assessment & Plan:  For better bowel emptying please use either Citrucel / Benefiber start with 2 tablespoons daily and titrate up or down to effect.  Also please use glycerin suppositories as needed to assist in evacuation. Last Assessment & Plan:  Formatting of this note might be different from the original. For better bowel empt   Chronic obstructive pulmonary disease, unspecified (HCC) 03/18/2018   Class 1 obesity due to excess calories with serious comorbidity and body mass index (BMI) of 31.0 to 31.9 in adult 06/26/2020    Colon polyps    COPD (chronic obstructive pulmonary disease) (HCC)    Coronary artery disease    DDD (degenerative disc disease), cervical 01/13/2019   Depression 11/05/2019   Diabetic polyneuropathy associated with type 2 diabetes mellitus (HCC) 02/24/2019   Diverticulitis 05/20/2018   Diverticulitis of colon 03/18/2018   Family history of ischemic heart disease (IHD) 08/16/2013   Fibromyalgia affecting multiple sites 01/14/2020   Gastro-esophageal reflux disease without esophagitis 01/13/2019   Glaucoma 11/08/2014   Hordeolum externum of left upper eyelid 03/13/2020   Hypertension 11/08/2014   IBS (irritable bowel syndrome)    Iron deficiency anemia 01/13/2019   Left renal mass 11/08/2019   Leukocytosis 05/28/2018   Low vitamin B12 level 03/13/2020   Low vitamin D level 12/12/2019   LV dysfunction 05/31/2019   Malignant essential hypertension 01/22/2016   Migraine, unspecified, not intractable, without status migrainosus 11/08/2014   Mixed hyperlipidemia 01/13/2019   Myocardial infarction (HCC)    Other premature beats 11/08/2014   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 Last Assessment & Plan:  For vaginal atrophy please place a pea size dab of Estrogen vaginal cream  into the vagina 3 times a week ( Monday, Wednesday, Friday) Last Assessment & Plan:  Formatting of this note might be different from the original. For vaginal atrophy please place a pea size dab of Estrogen vaginal cream  into the vagina     Past Surgical History:  Procedure Laterality Date   ABDOMINAL HYSTERECTOMY     BIV ICD INSERTION CRT-D N/A  12/31/2020   Procedure: BIV ICD INSERTION CRT-D;  Surgeon: Marinus Maw, MD;  Location: [facility] INVASIVE CV LAB;  Service: Cardiovascular;  Laterality: N/A;   BLEPHAROPLASTY Bilateral    COLON SURGERY     CORONARY ANGIOPLASTY WITH STENT PLACEMENT  2020   X3   CORONARY PRESSURE/FFR STUDY N/A 08/03/2020   Procedure: INTRAVASCULAR PRESSURE WIRE/FFR STUDY;  Surgeon: Tonny Bollman, MD;  Location: [facility] INVASIVE CV LAB;  Service: Cardiovascular;  Laterality: N/A;   CORONARY STENT INTERVENTION N/A 08/03/2020   Procedure: CORONARY STENT INTERVENTION;  Surgeon: Tonny Bollman, MD;  Location: [facility] INVASIVE CV LAB;  Service: Cardiovascular;  Laterality: N/A;   LEFT HEART CATH AND CORONARY ANGIOGRAPHY N/A 08/03/2020   Procedure: LEFT HEART CATH AND CORONARY ANGIOGRAPHY;  Surgeon: Tonny Bollman, MD;  Location: [facility] INVASIVE CV LAB;  Service: Cardiovascular;  Laterality: N/A;   RADIOFREQUENCY ABLATION  07/2023   REFRACTIVE SURGERY Left    [place]    Family History  Problem Relation Age of Onset   Lung cancer Mother    Coronary artery disease Mother    Diabetes Mother    Glaucoma Mother    Hypertension Mother    Colon polyps Mother    Migraines Father    Heart disease Father    Diabetes Maternal Grandmother    Diabetes Daughter    Migraines Son    Hypertension Brother     Current Medications, Allergies, Family History and Social History were reviewed in [employer] electronic medical record.     Current Outpatient Medications  Medication Sig Dispense Refill   acetaminophen (TYLENOL) 325 MG tablet Take 2 tablets by mouth every 6 (six) hours as needed for pain.     albuterol (PROVENTIL) (2.5 MG/3ML) 0.083% nebulizer solution INL CONTENTS OF 1 VIAL VIA NEBULIZER Q 4 H PRF WHEEZING OR COUGH     APPLE CIDER VINEGAR PO Take 2 tablets by mouth 2 (two) times daily. With digestive detox 1 capsule daily     aspirin 81 MG EC tablet 81 mg daily.     baclofen (LIORESAL) 10 MG tablet Take 10 mg by  mouth 3 (three) times daily.     BD PEN NEEDLE NANO 2ND GEN 32G X 4 MM MISC 3 (three) times daily.     carvedilol (COREG) 3.125 MG tablet TAKE 1 TABLET(3.125 MG) BY MOUTH TWICE DAILY WITH A MEAL 180 tablet 3   clopidogrel (PLAVIX) 75 MG tablet TAKE 1 TABLET(75 MG) BY MOUTH DAILY 90 tablet 3   clotrimazole-betamethasone (LOTRISONE) cream Apply 1 Application topically 2 (two) times daily.     Continuous Glucose Sensor (DEXCOM G7 SENSOR) MISC SMARTSIG:1 Topical Every 10 Days     dapagliflozin propanediol (FARXIGA) 10 MG TABS tablet TAKE 1 TABLET(10 MG) BY MOUTH DAILY BEFORE BREAKFAST 90 tablet 3   diclofenac Sodium (VOLTAREN) 1 % GEL Apply 2 g topically 2 (two) times daily as needed (pain).     dicyclomine (BENTYL) 10 MG capsule TAKE 1 CAPSULE(10 MG) BY MOUTH IN THE MORNING AND AT BEDTIME 180 capsule 1   ENTRESTO 24-26 MG TAKE 1 TABLET BY MOUTH TWICE DAILY 60 tablet 4   famotidine (PEPCID) 40 MG tablet TAKE 1 TABLET(40 MG) BY MOUTH TWICE DAILY 60 tablet 2   fluticasone (FLONASE) 50 MCG/ACT nasal spray Place 1 spray into both nostrils as needed for allergies or rhinitis.     folic acid (FOLVITE) 1 MG tablet Take 1 mg by mouth daily.     insulin lispro (HUMALOG) 100 UNIT/ML KwikPen Inject 16 Units into the skin 3 (three) times daily. Administer 16 units three times daily before a meal per sliding scale     ipratropium (ATROVENT) 0.03 % nasal spray Place 2 sprays into the nose in the morning, at noon, and at bedtime.     isosorbide mononitrate (IMDUR) 30 MG 24 hr tablet Take 15 mg by mouth daily.     lidocaine (LIDODERM) 5 % Place 1 patch onto the skin daily. Remove & Discard patch within 12 hours or as directed by MD 5 patch 0   loratadine (CLARITIN) 10 MG tablet Take 10 mg by mouth daily.     nitroGLYCERIN (NITROSTAT) 0.4 MG SL tablet DISSOLVE 1 TABLET UNDER TONGUE EVERY 5 MINUTES AS NEEDED FOR CHEST PAIN. 25 tablet 3   pantoprazole (PROTONIX) 40 MG  tablet TAKE 1 TABLET(40 MG) BY MOUTH DAILY 90 tablet  3   Plecanatide (TRULANCE) 3 MG TABS Take 1 tablet (3 mg total) by mouth daily. 30 tablet 2   pregabalin (LYRICA) 75 MG capsule Take 75 mg by mouth 2 (two) times daily.     ranolazine (RANEXA) 500 MG 12 hr tablet Take 1 tablet (500 mg total) by mouth 2 (two) times daily. 180 tablet 3   rosuvastatin (CRESTOR) 40 MG tablet Take 40 mg by mouth daily.     torsemide (DEMADEX) 20 MG tablet Take 1 tablet (20 mg total) by mouth 2 (two) times daily. 180 tablet 3   TRESIBA FLEXTOUCH 200 UNIT/ML FlexTouch Pen Inject 40 Units into the skin 2 (two) times daily. 40 units in the morning and up to 45 units at night     TRINTELLIX 5 MG TABS tablet Take 5 mg by mouth at bedtime.     Ubrogepant 50 MG TABS Take 1 tablet by mouth as needed.     valACYclovir (VALTREX) 500 MG tablet Take 500 mg by mouth daily.     VERQUVO 5 MG TABS TAKE 1 TABLET BY MOUTH EVERY DAY 90 tablet 3   vitamin B-12 (CYANOCOBALAMIN) 1000 MCG tablet Take 1,000 mcg by mouth daily.     Vitamin D, Ergocalciferol, (DRISDOL) 1.25 MG (50000 UNIT) CAPS capsule Take 50,000 Units by mouth once a week. sundays     ACETAMINOPHEN-BUTALBITAL 50-325 MG TABS Take 1 tablet by mouth every 4 (four) hours as needed for severe pain.     glycerin adult 2 g suppository Place 1 suppository rectally as needed for constipation. Apply after bowel movements. (Patient not taking: Reported on 09/11/2023) 12 suppository 0   pregabalin (LYRICA) 50 MG capsule Take 50 mg by mouth 3 (three) times daily. (Patient not taking: Reported on 09/11/2023)     No current facility-administered medications for this visit.    Review of Systems: No chest pain. No shortness of breath. No urinary complaints.    Physical Exam  Wt Readings from Last 3 Encounters:  09/11/23 205 lb 9.6 oz (93.3 kg)  07/30/23 205 lb 2 oz (93 kg)  07/07/23 205 lb 1.3 oz (93 kg)    BP 114/68   Pulse 81   Ht 5\' 5"  (1.651 m)   Wt 205 lb 9.6 oz (93.3 kg)   SpO2 97%   BMI 34.21 kg/m  Constitutional:   Pleasant, generally well appearing female in no acute distress. Psychiatric: Normal mood and affect. Behavior is normal. EENT: Pupils normal.  Conjunctivae are normal. No scleral icterus. Neck supple.  Cardiovascular: Normal rate, regular rhythm.  Pulmonary/chest: Effort normal and breath sounds normal. No wheezing, rales or rhonchi. Abdominal: Soft, nondistended, nontender. Bowel sounds active throughout. There are no masses palpable. No hepatomegaly. Rectal: Decreased resting sphincter tone.  Mildly decreased sphincter squeeze pressure.  On DRE, during simulated defecation it took a lot of effort to appreciate much pelvic floor descent Neurological: Alert and oriented to person place and time.    Willette Cluster, NP  09/11/2023, 3:15 PM  Cc:  Hadley Pen, MD

## 2023-09-11 NOTE — Patient Instructions (Signed)
We have placed a referral for pelvic floor physical therapy. They will contact you about setting up an appointment.  Try taking your [place] every other day.   I appreciate the opportunity to care for you. Willette Cluster NP-C

## 2023-09-23 ENCOUNTER — Encounter (HOSPITAL_COMMUNITY): Payer: Self-pay | Admitting: Cardiology

## 2023-09-23 ENCOUNTER — Ambulatory Visit (HOSPITAL_COMMUNITY)
Admission: RE | Admit: 2023-09-23 | Discharge: 2023-09-23 | Disposition: A | Discharge status: Home / Self Care | Payer: Medicare Other | Source: Ambulatory Visit | Attending: Cardiology | Admitting: Cardiology

## 2023-09-23 VITALS — BP 100/58 | HR 83 | Wt 207.8 lb

## 2023-09-23 DIAGNOSIS — D508 Other iron deficiency anemias: Secondary | ICD-10-CM

## 2023-09-23 DIAGNOSIS — R931 Abnormal findings on diagnostic imaging of heart and coronary circulation: Secondary | ICD-10-CM | POA: Diagnosis not present

## 2023-09-23 DIAGNOSIS — I2510 Atherosclerotic heart disease of native coronary artery without angina pectoris: Secondary | ICD-10-CM

## 2023-09-23 DIAGNOSIS — I50812 Chronic right heart failure: Secondary | ICD-10-CM

## 2023-09-23 DIAGNOSIS — I5042 Chronic combined systolic (congestive) and diastolic (congestive) heart failure: Secondary | ICD-10-CM

## 2023-09-23 DIAGNOSIS — I5022 Chronic systolic (congestive) heart failure: Secondary | ICD-10-CM | POA: Diagnosis not present

## 2023-09-23 DIAGNOSIS — I255 Ischemic cardiomyopathy: Secondary | ICD-10-CM | POA: Diagnosis not present

## 2023-09-23 LAB — CBC
HCT: 41 % (ref 36.0–46.0)
Hemoglobin: 13.2 g/dL (ref 12.0–15.0)
MCH: 30.6 pg (ref 26.0–34.0)
MCHC: 32.2 g/dL (ref 30.0–36.0)
MCV: 94.9 fL (ref 80.0–100.0)
Platelets: 255 10*3/uL (ref 150–400)
RBC: 4.32 MIL/uL (ref 3.87–5.11)
RDW: 14.6 % (ref 11.5–15.5)
WBC: 8.4 10*3/uL (ref 4.0–10.5)
nRBC: 0 % (ref 0.0–0.2)

## 2023-09-23 LAB — BASIC METABOLIC PANEL
Anion gap: 6 (ref 5–15)
BUN: 19 mg/dL (ref 8–23)
CO2: 30 mmol/L (ref 22–32)
Calcium: 9 mg/dL (ref 8.9–10.3)
Chloride: 103 mmol/L (ref 98–111)
Creatinine, Ser: 1.41 mg/dL — ABNORMAL HIGH (ref 0.44–1.00)
GFR, Estimated: 39 mL/min — ABNORMAL LOW (ref >60)
Glucose, Bld: 251 mg/dL — ABNORMAL HIGH (ref 70–99)
Potassium: 4.1 mmol/L (ref 3.5–5.1)
Sodium: 139 mmol/L (ref 135–145)

## 2023-09-23 LAB — IRON AND TIBC
Iron: 65 ug/dL (ref 28–170)
Saturation Ratios: 22 % (ref 10.4–31.8)
TIBC: 298 ug/dL (ref 250–450)
UIBC: 233 ug/dL

## 2023-09-23 LAB — LIPID PANEL
Cholesterol: 153 mg/dL (ref 0–200)
HDL: 52 mg/dL (ref >40)
LDL Cholesterol: 74 mg/dL (ref 0–99)
Total CHOL/HDL Ratio: 2.9 {ratio}
Triglycerides: 133 mg/dL (ref <150)
VLDL: 27 mg/dL (ref 0–40)

## 2023-09-23 LAB — FERRITIN: Ferritin: 69 ng/mL (ref 11–307)

## 2023-09-23 LAB — BRAIN NATRIURETIC PEPTIDE: B Natriuretic Peptide: 282.3 pg/mL — ABNORMAL HIGH (ref 0.0–100.0)

## 2023-09-23 MED ORDER — SPIRONOLACTONE 25 MG PO TABS
12.5000 mg | ORAL_TABLET | Freq: Every day | ORAL | 3 refills | Status: DC
Start: 2023-09-23 — End: 2023-11-11

## 2023-09-23 MED ORDER — TORSEMIDE 20 MG PO TABS
ORAL_TABLET | ORAL | 3 refills | Status: DC
Start: 2023-09-23 — End: 2024-06-07

## 2023-09-23 NOTE — Progress Notes (Signed)
ReDS Vest / Clip - 09/23/23 1400       ReDS Vest / Clip   Station Marker B    Ruler Value 32    ReDS Value Range Low volume    ReDS Actual Value 35

## 2023-09-23 NOTE — Patient Instructions (Signed)
START Spironolactone 12.5 mg ( 1/2 tab) daily.  INCREASE Torsemide to 40 mg in the morning 20 mg in the evening, alternating with 20 mg Twice daily  Labs done today, your results will be available in MyChart, we will contact you for abnormal readings.  You are scheduled for a Cardiac Catheterization on Tuesday, November 12 with Dr. Marca Ancona.  1. Please arrive at the [facility] (Main Entrance A) at [facility]: [address] [place], [place] [address] at 8:30 AM (This time is 2 hour(s) before your procedure to ensure your preparation). Free valet parking service is available. You will check in at ADMITTING. The support person will be asked to wait in the waiting room.  It is OK to have someone drop you off and come back when you are ready to be discharged.    Special note: Every effort is made to have your procedure done on time. Please understand that emergencies sometimes delay scheduled procedures.  2. Diet: Do not eat solid foods after midnight.  The patient may have clear liquids until 5am upon the day of the procedure.  3. Medication instructions in preparation for your procedure:   Contrast Allergy: No  TAKE 20 units of Tresiba the night before your procedure.  HOLD Farxiga, Spironolactone, Torsemide and all Insulins the day of your procedure.  On the morning of your procedure, take  any morning medicines NOT listed above.  You may use sips of water.  5. Plan to go home the same day, you will only stay overnight if medically necessary. 6. Bring a current list of your medications and current insurance cards. 7. You MUST have a responsible person to drive you home. 8. Someone MUST be with you the first 24 hours after you arrive home or your discharge will be delayed. 9. Please wear clothes that are easy to get on and off and wear slip-on shoes.  Your physician recommends that you schedule a follow-up appointment as scheduled.  If you have any questions or concerns  before your next appointment please send [place] a message through [place] or call our office at [phone].    TO LEAVE A MESSAGE FOR THE NURSE SELECT OPTION 2, PLEASE LEAVE A MESSAGE INCLUDING: YOUR NAME DATE OF BIRTH CALL BACK NUMBER REASON FOR CALL**this is important as we prioritize the call backs  YOU WILL RECEIVE A CALL BACK THE SAME DAY AS LONG AS YOU CALL BEFORE 4:00 PM  At the [facility], you and your health needs are our priority. As part of our continuing mission to provide you with exceptional heart care, we have created designated Provider Care Teams. These Care Teams include your primary Cardiologist (physician) and Advanced Practice Providers (APPs- Physician Assistants and Nurse Practitioners) who all work together to provide you with the care you need, when you need it.   You may see any of the following providers on your designated Care Team at your next follow up: Dr Arvilla Meres Dr Marca Ancona Dr. Dorthula Nettles Dr. Clearnce Hasten Amy Filbert Schilder, NP Robbie Lis, [place] [facility] [place], [place] Brynda Peon, NP [place] Lee, NP Karle Plumber, PharmD   Please be sure to bring in all your medications bottles to every appointment.    Thank you for choosing [facility]

## 2023-09-24 ENCOUNTER — Telehealth (HOSPITAL_COMMUNITY): Payer: Self-pay

## 2023-09-24 DIAGNOSIS — I5022 Chronic systolic (congestive) heart failure: Secondary | ICD-10-CM

## 2023-09-24 MED ORDER — EZETIMIBE 10 MG PO TABS: 10.0000 mg | ORAL_TABLET | Freq: Every day | ORAL | 6 refills | Status: DC

## 2023-09-24 NOTE — Telephone Encounter (Signed)
Patient's labs has been ordered and her appointment scheduled. In addition, her Zetia medication has been sent to her pharmacy. Pt aware, agreeable, and verbalized understanding.

## 2023-09-24 NOTE — Progress Notes (Signed)
[facility]  [address] [place],  [place]  [address] [phone]   Clinic Day:  09/25/2023  Referring physician: Hadley Pen, MD  HISTORY OF PRESENT ILLNESS:  The patient is a 72 y.o. 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  She comes in today to reassess her iron and hemoglobin levels after receiving IV iron in August 2024.  She claims to still feel weak despite her recent IV iron.  However, she apparently has cardiac issues which are also impacting her health. She denies having any overt forms of blood loss which concern her for progressive anemia.    PHYSICAL EXAM:  Blood pressure (!) 131/56, pulse 81, temperature 97.6 F (36.4 C), resp. rate 16, height 5\' 5"  (1.651 m), weight 205 lb 3.2 oz (93.1 kg), SpO2 99%. Wt Readings from Last 3 Encounters:  09/25/23 205 lb 3.2 oz (93.1 kg)  09/23/23 207 lb 12.8 oz (94.3 kg)  09/11/23 205 lb 9.6 oz (93.3 kg)   Body mass index is 34.15 kg/m. Performance status (ECOG): 1 - Symptomatic but completely ambulatory Physical Exam Constitutional:      General: She is not in acute distress.    Appearance: Normal appearance. She is normal weight.  HENT:     Head: Normocephalic and atraumatic.  Eyes:     General: No scleral icterus.    Extraocular Movements: Extraocular movements intact.     Conjunctiva/sclera: Conjunctivae normal.     Pupils: Pupils are equal, round, and reactive to light.  Cardiovascular:     Rate and Rhythm: Normal rate and regular rhythm.     Pulses: Normal pulses.     Heart sounds: Normal heart sounds. No murmur heard.    No friction rub. No gallop.  Pulmonary:     Effort: Pulmonary effort is normal. No respiratory distress.     Breath sounds: Normal breath sounds.  Abdominal:     General: Bowel sounds are normal. There is no distension.     Palpations: Abdomen is soft. There is no hepatomegaly, splenomegaly or mass.      Tenderness: There is no abdominal tenderness.  Musculoskeletal:        General: Normal range of motion.     Cervical back: Normal range of motion and neck supple.     Right lower leg: No edema.     Left lower leg: No edema.  Lymphadenopathy:     Cervical: No cervical adenopathy.  Skin:    General: Skin is warm and dry.  Neurological:     General: No focal deficit present.     Mental Status: She is alert and oriented to person, place, and time. Mental status is at baseline.  Psychiatric:        Mood and Affect: Mood normal.        Behavior: Behavior normal.        Thought Content: Thought content normal.        Judgment: Judgment normal.    LABS:       Latest Ref Rng & Units 09/25/2023   12:00 AM 09/23/2023    2:39 PM 06/25/2023   12:00 AM  CBC  WBC  9.5     8.4  7.4      Hemoglobin 12.0 - 16.0 13.8     13.2  11.9      Hematocrit 36 - 46 42     41.0  36      Platelets  150 - 400 K/uL 281     255  285         This result is from an external source.    Latest Reference Range & Units 09/25/23 14:28  Iron 28 - 170 ug/dL 59  UIBC ug/dL 161  TIBC 096 - 045 ug/dL 409  Saturation Ratios 10.4 - 31.8 % 18  Ferritin 11 - 307 ng/mL 62    ASSESSMENT & PLAN:  Assessment/Plan:  A 72 y.o. female with anemia secondary to iron deficiency and previous renal insufficiency.  Her hemoglobin is much better after receiving IV iron, as are her iron parameters.  As she is doing well now from a hematologic perspective, she will be followed conservatively from now.  I will see her back in 4 months for repeat clinical assessment.  The patient understands all the plans discussed today and is in agreement with them.  Alegra Rost Kirby Funk, MD

## 2023-09-24 NOTE — H&P (View-Only) (Signed)
PCP: Hadley Pen, MD Cardiology: Dr. Servando Salina HF Cardiology: Dr. Shirlee Latch   72 y.o. with history of CAD and ischemic cardiomyopathy and COPD was referred by Dr. Servando Salina for evaluation of CHF.  Patient had PCI to mid/distal LAD in 2020, then had unstable angina in 9/21 with DES to 70% stenosis proximal LAD.  She has had a cardiomyopathy for a number of years, [employer] CRT-D device placed in 2022.  Last echo in 8/24 showed EF < 20%, moderate LV dilation, mild LVH, RV normal.  She additionally has a diagnosis of COPD, quit smoking in 2021. She lives in [place], originally from [place] but moved here to be near family.   Patient reports worsening symptoms for several months.  She is short of breath walking around her house.  She elevated the head of her bed due to orthopnea.  She is short of breath with dressing and showering. No lightheadedness, palpitations.  She gets episodes of chest tightness.  She takes NTG every other week or so when she has a "bad episode."  This has also been worse recently.  The chest tightness is still not very frequent, occurs once a week or so and is not clearly reproducible (usually when stressed or especially active).  No prolonged episodes.   Labs (7/24): K 4, creatinine 1.45  ECG (personally reviewed): NSR, BiV pacing  [employer] device interrogation: HeartLogic 4  REDS clip 35%  PMH: 1. Fe deficiency anemia 2. OSA: Does not tolerate CPAP.  3. HTN 4. IBS 5. COPD: Quit smoking in 2021.  6. Depression  7. Type 2 diabetes 8. CAD: PCI mid-distal LAD in 2020.  - LHC (9/21): Unstable angina, 70% proximal LAD treated with DES.  9. Chronic systolic CHF: Suspect ischemic cardiomyopathy.  [employer] CRT-D device.  - Echo (8/22): EF 25-30% - Echo (3/23): EF 30% - Echo (8/24): EF < 20%, moderate LV dilation, mild LVH, RV normal 10. CKD stage 3  FH: No sudden death or cardiomyopathy  Social History   Socioeconomic History   Marital status:  Significant Other    Spouse name: Not on file   Number of children: 3   Years of education: Not on file   Highest education level: Not on file  Occupational History   Occupation: retired  Tobacco Use   Smoking status: Former    Current packs/day: 0.00    Types: Cigarettes    Quit date: 2021    Years since quitting: 3.8   Smokeless tobacco: Never  Vaping Use   Vaping status: Never Used  Substance and Sexual Activity   Alcohol use: Never   Drug use: Never   Sexual activity: Not on file  Other Topics Concern   Not on file  Social History Narrative   Not on file   Social Determinants of Health   Financial Resource Strain: Not on file  Food Insecurity: Not on file  Transportation Needs: Not on file  Physical Activity: Not on file  Stress: Not on file  Social Connections: Not on file  Intimate Partner Violence: Not on file   ROS: All systems reviewed and negative except as per HPI.   Current Outpatient Medications  Medication Sig Dispense Refill   acetaminophen (TYLENOL) 325 MG tablet Take 2 tablets by mouth every 6 (six) hours as needed for pain.     ACETAMINOPHEN-BUTALBITAL 50-325 MG TABS Take 1 tablet by mouth every 4 (four) hours as needed for severe pain.     albuterol (PROVENTIL) (  2.5 MG/3ML) 0.083% nebulizer solution INL CONTENTS OF 1 VIAL VIA NEBULIZER Q 4 H PRF WHEEZING OR COUGH     APPLE CIDER VINEGAR PO Take 2 tablets by mouth 2 (two) times daily. With digestive detox 1 capsule daily     aspirin 81 MG EC tablet 81 mg daily.     baclofen (LIORESAL) 10 MG tablet Take 10 mg by mouth 3 (three) times daily.     BD PEN NEEDLE NANO 2ND GEN 32G X 4 MM MISC 3 (three) times daily.     carvedilol (COREG) 3.125 MG tablet TAKE 1 TABLET(3.125 MG) BY MOUTH TWICE DAILY WITH A MEAL 180 tablet 3   clopidogrel (PLAVIX) 75 MG tablet TAKE 1 TABLET(75 MG) BY MOUTH DAILY 90 tablet 3   Continuous Glucose Sensor (DEXCOM G7 SENSOR) MISC SMARTSIG:1 Topical Every 10 Days     dapagliflozin  propanediol (FARXIGA) 10 MG TABS tablet TAKE 1 TABLET(10 MG) BY MOUTH DAILY BEFORE BREAKFAST 90 tablet 3   diclofenac Sodium (VOLTAREN) 1 % GEL Apply 2 g topically 2 (two) times daily as needed (pain).     dicyclomine (BENTYL) 10 MG capsule TAKE 1 CAPSULE(10 MG) BY MOUTH IN THE MORNING AND AT BEDTIME 180 capsule 1   ENTRESTO 24-26 MG TAKE 1 TABLET BY MOUTH TWICE DAILY 60 tablet 4   famotidine (PEPCID) 40 MG tablet TAKE 1 TABLET(40 MG) BY MOUTH TWICE DAILY 60 tablet 2   fluticasone (FLONASE) 50 MCG/ACT nasal spray Place 1 spray into both nostrils as needed for allergies or rhinitis.     folic acid (FOLVITE) 1 MG tablet Take 1 mg by mouth daily.     glycerin adult 2 g suppository Place 1 suppository rectally as needed for constipation. Apply after bowel movements. 12 suppository 0   insulin lispro (HUMALOG) 100 UNIT/ML KwikPen Inject 16 Units into the skin 3 (three) times daily. Administer 16 units three times daily before a meal per sliding scale     isosorbide mononitrate (IMDUR) 30 MG 24 hr tablet Take 15 mg by mouth daily.     lidocaine (LIDODERM) 5 % Place 1 patch onto the skin daily. Remove & Discard patch within 12 hours or as directed by MD 5 patch 0   loratadine (CLARITIN) 10 MG tablet Take 10 mg by mouth daily.     nitroGLYCERIN (NITROSTAT) 0.4 MG SL tablet DISSOLVE 1 TABLET UNDER TONGUE EVERY 5 MINUTES AS NEEDED FOR CHEST PAIN. 25 tablet 3   pantoprazole (PROTONIX) 40 MG tablet TAKE 1 TABLET(40 MG) BY MOUTH DAILY 90 tablet 3   Plecanatide (TRULANCE) 3 MG TABS Take 1 tablet (3 mg total) by mouth daily. 30 tablet 2   pregabalin (LYRICA) 75 MG capsule Take 75 mg by mouth 2 (two) times daily.     ranolazine (RANEXA) 500 MG 12 hr tablet Take 1 tablet (500 mg total) by mouth 2 (two) times daily. 180 tablet 3   rosuvastatin (CRESTOR) 40 MG tablet Take 40 mg by mouth daily.     spironolactone (ALDACTONE) 25 MG tablet Take 0.5 tablets (12.5 mg total) by mouth daily. 45 tablet 3   TRESIBA FLEXTOUCH  200 UNIT/ML FlexTouch Pen Inject 40 Units into the skin 2 (two) times daily. 40 units in the morning and up to 45 units at night     TRINTELLIX 5 MG TABS tablet Take 5 mg by mouth at bedtime.     Ubrogepant 50 MG TABS Take 1 tablet by mouth as needed.  valACYclovir (VALTREX) 500 MG tablet Take 500 mg by mouth daily.     VERQUVO 5 MG TABS TAKE 1 TABLET BY MOUTH EVERY DAY 90 tablet 3   vitamin B-12 (CYANOCOBALAMIN) 1000 MCG tablet Take 1,000 mcg by mouth daily.     Vitamin D, Ergocalciferol, (DRISDOL) 1.25 MG (50000 UNIT) CAPS capsule Take 50,000 Units by mouth once a week. sundays     torsemide (DEMADEX) 20 MG tablet 40 mg in the morning and 20 mg in the evening, alternating with 20 mg Twice daily 180 tablet 3   No current facility-administered medications for this encounter.   BP (!) 100/58   Pulse 83   Wt 94.3 kg (207 lb 12.8 oz)   SpO2 96%   BMI 34.58 kg/m  General: NAD Neck: JVP 8-9 cm with HJR, no thyromegaly or thyroid nodule.  Lungs: Clear to auscultation bilaterally with normal respiratory effort. CV: Nondisplaced PMI.  Heart regular S1/S2, no S3/S4, no murmur.  1+ ankle edema.  No carotid bruit.  Normal pedal pulses.  Abdomen: Soft, nontender, no hepatosplenomegaly, no distention.  Skin: Intact without lesions or rashes.  Neurologic: Alert and oriented x 3.  Psych: Normal affect. Extremities: No clubbing or cyanosis.  HEENT: Normal.   Assessment/Plan: 1. Chronic systolic CHF: Ischemic cardiomyopathy.  [employer] CRT-D device.  Echo in 8/24 showed EF < 20%, moderate LV dilation, mild LVH, RV normal.  This is somewhat worse than priors.  She has worsening NYHA class III symptoms.  By exam, she is volume overloaded.  REDS clip is borderline elevated as well at 35%.   Surprisingly, HeartLogic is not significantly elevated. BP is relatively soft so not a lot of room for GDMT adjustment.  - Increase torsemide to 40 qam/20 qpm alternating with 20 mg bid.  BMET/BNP today, BMET  in 10 days.  - Add spironolactone 12.5 mg at bedtime.  - Continue Coreg 3.125 mg bid.  - Continue Entresto 24/26 bid - Continue Verquvo 5 mg daily.  - Continue dapagliflozin 10 daily.  - With symptoms somewhat out of proportion to exam (could COPD play a role?), I will arrange for RHC to assess filling pressures and cardiac output. We discussed risks/benefits and she agrees to procedure.  2.  CAD: PCI to mid/distal LAD in 2020 and proximal LAD in 2021.  She has had increased episodes of chest pain with typical and atypical features.   - Given chest pain, worsened dyspnea, and fall in EF on recent echo, I will arrange for coronary angiography in addition to RHC.  We discussed risks/benefits and she agrees to procedure.  - Continue ranolazine and Imdur for angina.  She is also on Verquvo, but no definite interaction with Imdur.  However, if cath does not show significant obstructive disease, I will likely take her off Imdur.  - Continue ASA/Plavix.  - Continue Crestor, check lipids today.  3. COPD: History of smoking, quit 2021.  Should get eventual PFTs.  4. Fe deficiency anemia: Check Fe studies today.   Followup in 4 weeks post-cath.   Marca Ancona 09/24/2023

## 2023-09-24 NOTE — Telephone Encounter (Signed)
-----   Message from Karen Warner sent at 09/23/2023  4:55 PM EDT ----- Goal LDL < 55 with CAD, would suggest addition of Zetia 10 mg daily with lipids in 2 months.

## 2023-09-24 NOTE — Progress Notes (Signed)
PCP: Hadley Pen, MD Cardiology: Dr. Servando Salina HF Cardiology: Dr. Shirlee Latch   72 y.o. with history of CAD and ischemic cardiomyopathy and COPD was referred by Dr. Servando Salina for evaluation of CHF.  Patient had PCI to mid/distal LAD in 2020, then had unstable angina in 9/21 with DES to 70% stenosis proximal LAD.  She has had a cardiomyopathy for a number of years, [employer] CRT-D device placed in 2022.  Last echo in 8/24 showed EF < 20%, moderate LV dilation, mild LVH, RV normal.  She additionally has a diagnosis of COPD, quit smoking in 2021. She lives in [place], originally from [place] but moved here to be near family.   Patient reports worsening symptoms for several months.  She is short of breath walking around her house.  She elevated the head of her bed due to orthopnea.  She is short of breath with dressing and showering. No lightheadedness, palpitations.  She gets episodes of chest tightness.  She takes NTG every other week or so when she has a "bad episode."  This has also been worse recently.  The chest tightness is still not very frequent, occurs once a week or so and is not clearly reproducible (usually when stressed or especially active).  No prolonged episodes.   Labs (7/24): K 4, creatinine 1.45  ECG (personally reviewed): NSR, BiV pacing  [employer] device interrogation: HeartLogic 4  REDS clip 35%  PMH: 1. Fe deficiency anemia 2. OSA: Does not tolerate CPAP.  3. HTN 4. IBS 5. COPD: Quit smoking in 2021.  6. Depression  7. Type 2 diabetes 8. CAD: PCI mid-distal LAD in 2020.  - LHC (9/21): Unstable angina, 70% proximal LAD treated with DES.  9. Chronic systolic CHF: Suspect ischemic cardiomyopathy.  [employer] CRT-D device.  - Echo (8/22): EF 25-30% - Echo (3/23): EF 30% - Echo (8/24): EF < 20%, moderate LV dilation, mild LVH, RV normal 10. CKD stage 3  FH: No sudden death or cardiomyopathy  Social History   Socioeconomic History   Marital status:  Significant Other    Spouse name: Not on file   Number of children: 3   Years of education: Not on file   Highest education level: Not on file  Occupational History   Occupation: retired  Tobacco Use   Smoking status: Former    Current packs/day: 0.00    Types: Cigarettes    Quit date: 2021    Years since quitting: 3.8   Smokeless tobacco: Never  Vaping Use   Vaping status: Never Used  Substance and Sexual Activity   Alcohol use: Never   Drug use: Never   Sexual activity: Not on file  Other Topics Concern   Not on file  Social History Narrative   Not on file   Social Determinants of Health   Financial Resource Strain: Not on file  Food Insecurity: Not on file  Transportation Needs: Not on file  Physical Activity: Not on file  Stress: Not on file  Social Connections: Not on file  Intimate Partner Violence: Not on file   ROS: All systems reviewed and negative except as per HPI.   Current Outpatient Medications  Medication Sig Dispense Refill   acetaminophen (TYLENOL) 325 MG tablet Take 2 tablets by mouth every 6 (six) hours as needed for pain.     ACETAMINOPHEN-BUTALBITAL 50-325 MG TABS Take 1 tablet by mouth every 4 (four) hours as needed for severe pain.     albuterol (PROVENTIL) (  2.5 MG/3ML) 0.083% nebulizer solution INL CONTENTS OF 1 VIAL VIA NEBULIZER Q 4 H PRF WHEEZING OR COUGH     APPLE CIDER VINEGAR PO Take 2 tablets by mouth 2 (two) times daily. With digestive detox 1 capsule daily     aspirin 81 MG EC tablet 81 mg daily.     baclofen (LIORESAL) 10 MG tablet Take 10 mg by mouth 3 (three) times daily.     BD PEN NEEDLE NANO 2ND GEN 32G X 4 MM MISC 3 (three) times daily.     carvedilol (COREG) 3.125 MG tablet TAKE 1 TABLET(3.125 MG) BY MOUTH TWICE DAILY WITH A MEAL 180 tablet 3   clopidogrel (PLAVIX) 75 MG tablet TAKE 1 TABLET(75 MG) BY MOUTH DAILY 90 tablet 3   Continuous Glucose Sensor (DEXCOM G7 SENSOR) MISC SMARTSIG:1 Topical Every 10 Days     dapagliflozin  propanediol (FARXIGA) 10 MG TABS tablet TAKE 1 TABLET(10 MG) BY MOUTH DAILY BEFORE BREAKFAST 90 tablet 3   diclofenac Sodium (VOLTAREN) 1 % GEL Apply 2 g topically 2 (two) times daily as needed (pain).     dicyclomine (BENTYL) 10 MG capsule TAKE 1 CAPSULE(10 MG) BY MOUTH IN THE MORNING AND AT BEDTIME 180 capsule 1   ENTRESTO 24-26 MG TAKE 1 TABLET BY MOUTH TWICE DAILY 60 tablet 4   famotidine (PEPCID) 40 MG tablet TAKE 1 TABLET(40 MG) BY MOUTH TWICE DAILY 60 tablet 2   fluticasone (FLONASE) 50 MCG/ACT nasal spray Place 1 spray into both nostrils as needed for allergies or rhinitis.     folic acid (FOLVITE) 1 MG tablet Take 1 mg by mouth daily.     glycerin adult 2 g suppository Place 1 suppository rectally as needed for constipation. Apply after bowel movements. 12 suppository 0   insulin lispro (HUMALOG) 100 UNIT/ML KwikPen Inject 16 Units into the skin 3 (three) times daily. Administer 16 units three times daily before a meal per sliding scale     isosorbide mononitrate (IMDUR) 30 MG 24 hr tablet Take 15 mg by mouth daily.     lidocaine (LIDODERM) 5 % Place 1 patch onto the skin daily. Remove & Discard patch within 12 hours or as directed by MD 5 patch 0   loratadine (CLARITIN) 10 MG tablet Take 10 mg by mouth daily.     nitroGLYCERIN (NITROSTAT) 0.4 MG SL tablet DISSOLVE 1 TABLET UNDER TONGUE EVERY 5 MINUTES AS NEEDED FOR CHEST PAIN. 25 tablet 3   pantoprazole (PROTONIX) 40 MG tablet TAKE 1 TABLET(40 MG) BY MOUTH DAILY 90 tablet 3   Plecanatide (TRULANCE) 3 MG TABS Take 1 tablet (3 mg total) by mouth daily. 30 tablet 2   pregabalin (LYRICA) 75 MG capsule Take 75 mg by mouth 2 (two) times daily.     ranolazine (RANEXA) 500 MG 12 hr tablet Take 1 tablet (500 mg total) by mouth 2 (two) times daily. 180 tablet 3   rosuvastatin (CRESTOR) 40 MG tablet Take 40 mg by mouth daily.     spironolactone (ALDACTONE) 25 MG tablet Take 0.5 tablets (12.5 mg total) by mouth daily. 45 tablet 3   TRESIBA FLEXTOUCH  200 UNIT/ML FlexTouch Pen Inject 40 Units into the skin 2 (two) times daily. 40 units in the morning and up to 45 units at night     TRINTELLIX 5 MG TABS tablet Take 5 mg by mouth at bedtime.     Ubrogepant 50 MG TABS Take 1 tablet by mouth as needed.  valACYclovir (VALTREX) 500 MG tablet Take 500 mg by mouth daily.     VERQUVO 5 MG TABS TAKE 1 TABLET BY MOUTH EVERY DAY 90 tablet 3   vitamin B-12 (CYANOCOBALAMIN) 1000 MCG tablet Take 1,000 mcg by mouth daily.     Vitamin D, Ergocalciferol, (DRISDOL) 1.25 MG (50000 UNIT) CAPS capsule Take 50,000 Units by mouth once a week. sundays     torsemide (DEMADEX) 20 MG tablet 40 mg in the morning and 20 mg in the evening, alternating with 20 mg Twice daily 180 tablet 3   No current facility-administered medications for this encounter.   BP (!) 100/58   Pulse 83   Wt 94.3 kg (207 lb 12.8 oz)   SpO2 96%   BMI 34.58 kg/m  General: NAD Neck: JVP 8-9 cm with HJR, no thyromegaly or thyroid nodule.  Lungs: Clear to auscultation bilaterally with normal respiratory effort. CV: Nondisplaced PMI.  Heart regular S1/S2, no S3/S4, no murmur.  1+ ankle edema.  No carotid bruit.  Normal pedal pulses.  Abdomen: Soft, nontender, no hepatosplenomegaly, no distention.  Skin: Intact without lesions or rashes.  Neurologic: Alert and oriented x 3.  Psych: Normal affect. Extremities: No clubbing or cyanosis.  HEENT: Normal.   Assessment/Plan: 1. Chronic systolic CHF: Ischemic cardiomyopathy.  [employer] CRT-D device.  Echo in 8/24 showed EF < 20%, moderate LV dilation, mild LVH, RV normal.  This is somewhat worse than priors.  She has worsening NYHA class III symptoms.  By exam, she is volume overloaded.  REDS clip is borderline elevated as well at 35%.   Surprisingly, HeartLogic is not significantly elevated. BP is relatively soft so not a lot of room for GDMT adjustment.  - Increase torsemide to 40 qam/20 qpm alternating with 20 mg bid.  BMET/BNP today, BMET  in 10 days.  - Add spironolactone 12.5 mg at bedtime.  - Continue Coreg 3.125 mg bid.  - Continue Entresto 24/26 bid - Continue Verquvo 5 mg daily.  - Continue dapagliflozin 10 daily.  - With symptoms somewhat out of proportion to exam (could COPD play a role?), I will arrange for RHC to assess filling pressures and cardiac output. We discussed risks/benefits and she agrees to procedure.  2.  CAD: PCI to mid/distal LAD in 2020 and proximal LAD in 2021.  She has had increased episodes of chest pain with typical and atypical features.   - Given chest pain, worsened dyspnea, and fall in EF on recent echo, I will arrange for coronary angiography in addition to RHC.  We discussed risks/benefits and she agrees to procedure.  - Continue ranolazine and Imdur for angina.  She is also on Verquvo, but no definite interaction with Imdur.  However, if cath does not show significant obstructive disease, I will likely take her off Imdur.  - Continue ASA/Plavix.  - Continue Crestor, check lipids today.  3. COPD: History of smoking, quit 2021.  Should get eventual PFTs.  4. Fe deficiency anemia: Check Fe studies today.   Followup in 4 weeks post-cath.   Marca Ancona 09/24/2023

## 2023-09-25 ENCOUNTER — Inpatient Hospital Stay: Payer: Medicare Other | Attending: Oncology | Admitting: Oncology

## 2023-09-25 ENCOUNTER — Inpatient Hospital Stay: Payer: Medicare Other

## 2023-09-25 ENCOUNTER — Other Ambulatory Visit: Payer: Self-pay | Admitting: Oncology

## 2023-09-25 VITALS — BP 131/56 | HR 81 | Temp 97.6°F | Resp 16 | Ht 65.0 in | Wt 205.2 lb

## 2023-09-25 DIAGNOSIS — N189 Chronic kidney disease, unspecified: Secondary | ICD-10-CM | POA: Insufficient documentation

## 2023-09-25 DIAGNOSIS — D631 Anemia in chronic kidney disease: Secondary | ICD-10-CM | POA: Diagnosis present

## 2023-09-25 DIAGNOSIS — D508 Other iron deficiency anemias: Secondary | ICD-10-CM

## 2023-09-25 LAB — CBC AND DIFFERENTIAL
HCT: 42 (ref 36–46)
Hemoglobin: 13.8 (ref 12.0–16.0)
Neutrophils Absolute: 6.46
Platelets: 281 10*3/uL (ref 150–400)
WBC: 9.5

## 2023-09-25 LAB — CMP (CANCER CENTER ONLY)
ALT: 16 U/L (ref 0–44)
AST: 17 U/L (ref 15–41)
Albumin: 3.8 g/dL (ref 3.5–5.0)
Alkaline Phosphatase: 72 U/L (ref 38–126)
Anion gap: 10 (ref 5–15)
BUN: 28 mg/dL — ABNORMAL HIGH (ref 8–23)
CO2: 25 mmol/L (ref 22–32)
Calcium: 9 mg/dL (ref 8.9–10.3)
Chloride: 103 mmol/L (ref 98–111)
Creatinine: 1.6 mg/dL — ABNORMAL HIGH (ref 0.44–1.00)
GFR, Estimated: 34 mL/min — ABNORMAL LOW (ref >60)
Glucose, Bld: 134 mg/dL — ABNORMAL HIGH (ref 70–99)
Potassium: 4.1 mmol/L (ref 3.5–5.1)
Sodium: 138 mmol/L (ref 135–145)
Total Bilirubin: 0.5 mg/dL (ref 0.3–1.2)
Total Protein: 7.6 g/dL (ref 6.5–8.1)

## 2023-09-25 LAB — IRON AND TIBC
Iron: 59 ug/dL (ref 28–170)
Saturation Ratios: 18 % (ref 10.4–31.8)
TIBC: 332 ug/dL (ref 250–450)
UIBC: 273 ug/dL

## 2023-09-25 LAB — FERRITIN: Ferritin: 62 ng/mL (ref 11–307)

## 2023-09-25 LAB — CBC: RBC: 4.47 (ref 3.87–5.11)

## 2023-09-27 ENCOUNTER — Encounter: Payer: Self-pay | Admitting: Oncology

## 2023-09-28 ENCOUNTER — Other Ambulatory Visit: Payer: Self-pay

## 2023-09-28 MED ORDER — CARVEDILOL 3.125 MG PO TABS: 3.1250 mg | ORAL_TABLET | Freq: Two times a day (BID) | ORAL | 2 refills | Status: DC

## 2023-10-02 ENCOUNTER — Ambulatory Visit (INDEPENDENT_AMBULATORY_CARE_PROVIDER_SITE_OTHER): Payer: Medicare Other

## 2023-10-02 DIAGNOSIS — I255 Ischemic cardiomyopathy: Secondary | ICD-10-CM | POA: Diagnosis not present

## 2023-10-02 LAB — CUP PACEART REMOTE DEVICE CHECK
Battery Remaining Longevity: 90 mo
Battery Remaining Percentage: 100 %
Brady Statistic RA Percent Paced: 0 %
Brady Statistic RV Percent Paced: 100 %
Date Time Interrogation Session: 20241101031100
HighPow Impedance: 83 Ohm
Implantable Lead Connection Status: 753985
Implantable Lead Connection Status: 753985
Implantable Lead Connection Status: 753985
Implantable Lead Implant Date: 20220131
Implantable Lead Implant Date: 20220131
Implantable Lead Implant Date: 20220131
Implantable Lead Location: 753858
Implantable Lead Location: 753859
Implantable Lead Location: 753860
Implantable Lead Model: 137
Implantable Lead Model: 3830
Implantable Lead Model: 7840
Implantable Lead Serial Number: 1047234
Implantable Lead Serial Number: 301179
Implantable Pulse Generator Implant Date: 20220131
Lead Channel Impedance Value: 523 Ohm
Lead Channel Impedance Value: 547 Ohm
Lead Channel Impedance Value: 712 Ohm
Lead Channel Setting Pacing Amplitude: 0.1 V
Lead Channel Setting Pacing Amplitude: 3.5 V
Lead Channel Setting Pacing Amplitude: 3.5 V
Lead Channel Setting Pacing Pulse Width: 0.1 ms
Lead Channel Setting Pacing Pulse Width: 0.4 ms
Lead Channel Setting Sensing Sensitivity: 0.5 mV
Lead Channel Setting Sensing Sensitivity: 1 mV
Pulse Gen Serial Number: 149649
Zone Setting Status: 755011

## 2023-10-13 ENCOUNTER — Ambulatory Visit (HOSPITAL_COMMUNITY)
Admission: RE | Admit: 2023-10-13 | Discharge: 2023-10-13 | Disposition: A | Discharge status: Home / Self Care | Payer: Medicare Other | Attending: Cardiology | Admitting: Cardiology

## 2023-10-13 ENCOUNTER — Other Ambulatory Visit: Payer: Self-pay

## 2023-10-13 ENCOUNTER — Encounter (HOSPITAL_COMMUNITY)
Admission: RE | Disposition: A | Discharge status: Home / Self Care | Payer: Self-pay | Source: Home / Self Care | Attending: Cardiology

## 2023-10-13 DIAGNOSIS — N183 Chronic kidney disease, stage 3 unspecified: Secondary | ICD-10-CM | POA: Diagnosis not present

## 2023-10-13 DIAGNOSIS — Z79899 Other long term (current) drug therapy: Secondary | ICD-10-CM | POA: Insufficient documentation

## 2023-10-13 DIAGNOSIS — J449 Chronic obstructive pulmonary disease, unspecified: Secondary | ICD-10-CM | POA: Diagnosis not present

## 2023-10-13 DIAGNOSIS — I5022 Chronic systolic (congestive) heart failure: Secondary | ICD-10-CM | POA: Diagnosis not present

## 2023-10-13 DIAGNOSIS — Z7902 Long term (current) use of antithrombotics/antiplatelets: Secondary | ICD-10-CM | POA: Diagnosis not present

## 2023-10-13 DIAGNOSIS — Z87891 Personal history of nicotine dependence: Secondary | ICD-10-CM | POA: Insufficient documentation

## 2023-10-13 DIAGNOSIS — E611 Iron deficiency: Secondary | ICD-10-CM | POA: Diagnosis not present

## 2023-10-13 DIAGNOSIS — Z955 Presence of coronary angioplasty implant and graft: Secondary | ICD-10-CM | POA: Insufficient documentation

## 2023-10-13 DIAGNOSIS — I25119 Atherosclerotic heart disease of native coronary artery with unspecified angina pectoris: Secondary | ICD-10-CM | POA: Insufficient documentation

## 2023-10-13 DIAGNOSIS — I2510 Atherosclerotic heart disease of native coronary artery without angina pectoris: Secondary | ICD-10-CM

## 2023-10-13 DIAGNOSIS — Z7982 Long term (current) use of aspirin: Secondary | ICD-10-CM | POA: Diagnosis not present

## 2023-10-13 DIAGNOSIS — I255 Ischemic cardiomyopathy: Secondary | ICD-10-CM | POA: Insufficient documentation

## 2023-10-13 DIAGNOSIS — Z794 Long term (current) use of insulin: Secondary | ICD-10-CM | POA: Insufficient documentation

## 2023-10-13 DIAGNOSIS — E1122 Type 2 diabetes mellitus with diabetic chronic kidney disease: Secondary | ICD-10-CM | POA: Insufficient documentation

## 2023-10-13 DIAGNOSIS — I130 Hypertensive heart and chronic kidney disease with heart failure and stage 1 through stage 4 chronic kidney disease, or unspecified chronic kidney disease: Secondary | ICD-10-CM | POA: Insufficient documentation

## 2023-10-13 HISTORY — PX: RIGHT/LEFT HEART CATH AND CORONARY ANGIOGRAPHY: CATH118266

## 2023-10-13 LAB — POCT I-STAT EG7
Acid-base deficit: 1 mmol/L (ref 0.0–2.0)
Acid-base deficit: 1 mmol/L (ref 0.0–2.0)
Bicarbonate: 25.1 mmol/L (ref 20.0–28.0)
Bicarbonate: 25.2 mmol/L (ref 20.0–28.0)
Calcium, Ion: 1.21 mmol/L (ref 1.15–1.40)
Calcium, Ion: 1.25 mmol/L (ref 1.15–1.40)
HCT: 36 % (ref 36.0–46.0)
HCT: 36 % (ref 36.0–46.0)
Hemoglobin: 12.2 g/dL (ref 12.0–15.0)
Hemoglobin: 12.2 g/dL (ref 12.0–15.0)
O2 Saturation: 61 %
O2 Saturation: 63 %
Potassium: 4.3 mmol/L (ref 3.5–5.1)
Potassium: 4.4 mmol/L (ref 3.5–5.1)
Sodium: 140 mmol/L (ref 135–145)
Sodium: 141 mmol/L (ref 135–145)
TCO2: 26 mmol/L (ref 22–32)
TCO2: 27 mmol/L (ref 22–32)
pCO2, Ven: 45.3 mm[Hg] (ref 44–60)
pCO2, Ven: 45.3 mm[Hg] (ref 44–60)
pH, Ven: 7.351 (ref 7.25–7.43)
pH, Ven: 7.354 (ref 7.25–7.43)
pO2, Ven: 34 mm[Hg] (ref 32–45)
pO2, Ven: 34 mm[Hg] (ref 32–45)

## 2023-10-13 LAB — GLUCOSE, CAPILLARY: Glucose-Capillary: 200 mg/dL — ABNORMAL HIGH (ref 70–99)

## 2023-10-13 SURGERY — RIGHT/LEFT HEART CATH AND CORONARY ANGIOGRAPHY
Anesthesia: LOCAL

## 2023-10-13 MED ORDER — ACETAMINOPHEN 325 MG PO TABS: 650.0000 mg | ORAL_TABLET | ORAL | Status: DC | PRN

## 2023-10-13 MED ORDER — LABETALOL HCL 5 MG/ML IV SOLN: 10.0000 mg | INTRAVENOUS | Status: DC | PRN

## 2023-10-13 MED ORDER — LIDOCAINE HCL (PF) 1 % IJ SOLN
INTRAMUSCULAR | Status: AC
  Filled 2023-10-13: qty 30

## 2023-10-13 MED ORDER — HEPARIN SODIUM (PORCINE) 1000 UNIT/ML IJ SOLN
INTRAMUSCULAR | Status: DC | PRN
  Administered 2023-10-13: 5000 [IU] via INTRAVENOUS

## 2023-10-13 MED ORDER — FENTANYL CITRATE (PF) 100 MCG/2ML IJ SOLN
INTRAMUSCULAR | Status: DC | PRN
  Administered 2023-10-13: 25 ug via INTRAVENOUS
  Administered 2023-10-13: 12.5 ug via INTRAVENOUS

## 2023-10-13 MED ORDER — SODIUM CHLORIDE 0.9 % IV SOLN: INTRAVENOUS | Status: DC

## 2023-10-13 MED ORDER — HEPARIN SODIUM (PORCINE) 1000 UNIT/ML IJ SOLN
INTRAMUSCULAR | Status: AC
  Filled 2023-10-13: qty 10

## 2023-10-13 MED ORDER — HEPARIN (PORCINE) IN NACL 1000-0.9 UT/500ML-% IV SOLN
INTRAVENOUS | Status: DC | PRN
  Administered 2023-10-13 (×2): 500 mL

## 2023-10-13 MED ORDER — MIDAZOLAM HCL 2 MG/2ML IJ SOLN
INTRAMUSCULAR | Status: AC
  Filled 2023-10-13: qty 2

## 2023-10-13 MED ORDER — ASPIRIN 81 MG PO CHEW
81.0000 mg | CHEWABLE_TABLET | ORAL | Status: AC
  Administered 2023-10-13: 81 mg via ORAL
  Filled 2023-10-13: qty 1

## 2023-10-13 MED ORDER — LIDOCAINE HCL (PF) 1 % IJ SOLN
INTRAMUSCULAR | Status: DC | PRN
  Administered 2023-10-13 (×2): 5 mL

## 2023-10-13 MED ORDER — SODIUM CHLORIDE 0.9% FLUSH
3.0000 mL | INTRAVENOUS | Status: DC | PRN
Start: 2023-10-13 — End: 2023-10-13

## 2023-10-13 MED ORDER — VERAPAMIL HCL 2.5 MG/ML IV SOLN
INTRAVENOUS | Status: DC | PRN
  Administered 2023-10-13: 10 mL via INTRA_ARTERIAL

## 2023-10-13 MED ORDER — SODIUM CHLORIDE 0.9 % IV SOLN
250.0000 mL | INTRAVENOUS | Status: DC | PRN
Start: 2023-10-13 — End: 2023-10-13

## 2023-10-13 MED ORDER — MIDAZOLAM HCL 2 MG/2ML IJ SOLN
INTRAMUSCULAR | Status: DC | PRN
  Administered 2023-10-13: 1 mg via INTRAVENOUS
  Administered 2023-10-13: .5 mg via INTRAVENOUS

## 2023-10-13 MED ORDER — FENTANYL CITRATE (PF) 100 MCG/2ML IJ SOLN
INTRAMUSCULAR | Status: AC
  Filled 2023-10-13: qty 2

## 2023-10-13 MED ORDER — HYDRALAZINE HCL 20 MG/ML IJ SOLN: 10.0000 mg | INTRAMUSCULAR | Status: DC | PRN

## 2023-10-13 MED ORDER — ONDANSETRON HCL 4 MG/2ML IJ SOLN: 4.0000 mg | Freq: Four times a day (QID) | INTRAMUSCULAR | Status: DC | PRN

## 2023-10-13 MED ORDER — IOHEXOL 350 MG/ML SOLN
INTRAVENOUS | Status: DC | PRN
  Administered 2023-10-13: 40 mL

## 2023-10-13 MED ORDER — VERAPAMIL HCL 2.5 MG/ML IV SOLN
INTRAVENOUS | Status: AC
Start: 2023-10-13 — End: ?
  Filled 2023-10-13: qty 2

## 2023-10-13 MED ORDER — SODIUM CHLORIDE 0.9% FLUSH: 3.0000 mL | Freq: Two times a day (BID) | INTRAVENOUS | Status: DC

## 2023-10-13 SURGICAL SUPPLY — 12 items
CATH 5FR JL3.5 JR4 ANG PIG MP (CATHETERS) IMPLANT
CATH BALLN WEDGE 5F 110CM (CATHETERS) IMPLANT
DEVICE RAD COMP TR BAND LRG (VASCULAR PRODUCTS) IMPLANT
GLIDESHEATH SLEND SS 6F .021 (SHEATH) IMPLANT
GUIDEWIRE INQWIRE 1.5J.035X260 (WIRE) IMPLANT
INQWIRE 1.5J .035X260CM (WIRE) ×1
KIT HEART LEFT (KITS) IMPLANT
PACK CARDIAC CATHETERIZATION (CUSTOM PROCEDURE TRAY) ×1 IMPLANT
SHEATH GLIDE SLENDER 4/5FR (SHEATH) IMPLANT
SHEATH PROBE COVER 6X72 (BAG) IMPLANT
TRANSDUCER W/STOPCOCK (MISCELLANEOUS) IMPLANT
TUBING CIL FLEX 10 FLL-RA (TUBING) IMPLANT

## 2023-10-13 NOTE — Progress Notes (Signed)
TR BAND REMOVAL  LOCATION:    right radial  DEFLATED PER PROTOCOL:    Yes.    TIME BAND OFF / DRESSING APPLIED:    1335 gauze dressing applied   SITE UPON ARRIVAL:    Level 0  SITE AFTER BAND REMOVAL:    Level 0  CIRCULATION SENSATION AND MOVEMENT:    Within Normal Limits   Yes.    COMMENTS:   no issues noted

## 2023-10-13 NOTE — Interval H&P Note (Signed)
History and Physical Interval Note:  10/13/2023 10:46 AM  Karen Warner  has presented today for surgery, with the diagnosis of chf.  The various methods of treatment have been discussed with the patient and family. After consideration of risks, benefits and other options for treatment, the patient has consented to  Procedure(s): RIGHT/LEFT HEART CATH AND CORONARY ANGIOGRAPHY (N/A) as a surgical intervention.  The patient's history has been reviewed, patient examined, no change in status, stable for surgery.  I have reviewed the patient's chart and labs.  Questions were answered to the patient's satisfaction.     Link Burgeson [employer]

## 2023-10-13 NOTE — Discharge Instructions (Signed)

## 2023-10-14 ENCOUNTER — Encounter (HOSPITAL_COMMUNITY): Payer: Self-pay | Admitting: Cardiology

## 2023-10-14 NOTE — Progress Notes (Signed)
Remote ICD transmission.   

## 2023-10-20 ENCOUNTER — Encounter: Payer: Self-pay | Admitting: Cardiology

## 2023-10-26 ENCOUNTER — Ambulatory Visit (INDEPENDENT_AMBULATORY_CARE_PROVIDER_SITE_OTHER): Payer: Medicare Other | Admitting: Podiatry

## 2023-10-26 ENCOUNTER — Encounter: Payer: Self-pay | Admitting: Podiatry

## 2023-10-26 DIAGNOSIS — B351 Tinea unguium: Secondary | ICD-10-CM | POA: Diagnosis not present

## 2023-10-26 DIAGNOSIS — S93422A Sprain of deltoid ligament of left ankle, initial encounter: Secondary | ICD-10-CM | POA: Diagnosis not present

## 2023-10-26 DIAGNOSIS — E1142 Type 2 diabetes mellitus with diabetic polyneuropathy: Secondary | ICD-10-CM

## 2023-10-26 DIAGNOSIS — I739 Peripheral vascular disease, unspecified: Secondary | ICD-10-CM | POA: Diagnosis not present

## 2023-10-26 NOTE — Patient Instructions (Signed)
Wear good supportive shoes with thick chunky soles around the arch and heel.  Brands to look for include [employer], [employer], [employer], [employer], [employer]

## 2023-10-27 NOTE — Progress Notes (Signed)
Chief Complaint  Patient presents with   Foot Pain    Dm foot Care - Nail Trim both Feet    HPI: 72 y.o. female presents today for diabetic footcare.  She presents with painful, thickened, elongated toenails that (tubular.  The patient is unable to maintain them herself due to the thickened appearance and due to mobility issues.  Patient is unsure of her last A1c she states it is in the mid 7s.  Of note, the patient complains of left medial ankle pain after falling 3 weeks ago and injuring her hip.  There is some bruising along the medial ankle.  She also complains of long-term right ankle pain which she describes as a dull ache for months.  She denies any nausea, vomiting, fever, chills, chest pain, shortness of breath.  Past Medical History:  Diagnosis Date   Anemia 12/12/2019   Arthralgia 01/14/2020   Arthritis    Asthma    Atherosclerotic heart disease of native coronary artery without angina pectoris 04/13/2014   CHF (congestive heart failure) (HCC)    Chronic bilateral low back pain with bilateral sciatica 01/13/2019   Chronic bladder pain 11/08/2014   Last Assessment & Plan:  Formatting of this note might be different from the original. Patient has chronic pain syndrome in general I think this is unfortunately worsening her pelvic pain and bladder pain her examination today was very reassuring I am advised her to use the estrogen cream I did start her on Elavil 10 milligrams at that time if she does tolerated will increase it to 25 milligrams.    Chronic headaches    Chronic idiopathic constipation 12/22/2014   Formatting of this note might be different from the original. Last Assessment & Plan:  For better bowel emptying please use either Citrucel / Benefiber start with 2 tablespoons daily and titrate up or down to effect.  Also please use glycerin suppositories as needed to assist in evacuation. Last Assessment & Plan:  Formatting of this note might be different from the  original. For better bowel empt   Chronic obstructive pulmonary disease, unspecified (HCC) 03/18/2018   Class 1 obesity due to excess calories with serious comorbidity and body mass index (BMI) of 31.0 to 31.9 in adult 06/26/2020   Colon polyps    COPD (chronic obstructive pulmonary disease) (HCC)    Coronary artery disease    DDD (degenerative disc disease), cervical 01/13/2019   Depression 11/05/2019   Diabetic polyneuropathy associated with type 2 diabetes mellitus (HCC) 02/24/2019   Diverticulitis 05/20/2018   Diverticulitis of colon 03/18/2018   Family history of ischemic heart disease (IHD) 08/16/2013   Fibromyalgia affecting multiple sites 01/14/2020   Gastro-esophageal reflux disease without esophagitis 01/13/2019   Glaucoma 11/08/2014   Hordeolum externum of left upper eyelid 03/13/2020   Hypertension 11/08/2014   IBS (irritable bowel syndrome)    Iron deficiency anemia 01/13/2019   Left renal mass 11/08/2019   Leukocytosis 05/28/2018   Low vitamin B12 level 03/13/2020   Low vitamin D level 12/12/2019   LV dysfunction 05/31/2019   Malignant essential hypertension 01/22/2016   Migraine, unspecified, not intractable, without status migrainosus 11/08/2014   Mixed hyperlipidemia 01/13/2019   Myocardial infarction (HCC)    Other premature beats 11/08/2014   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 Last Assessment & Plan:  For vaginal atrophy please place a pea size dab of Estrogen vaginal cream  into the vagina 3 times a week ( Monday, Wednesday, Friday) Last Assessment & Plan:  Formatting of this note might be  different from the original. For vaginal atrophy please place a pea size dab of Estrogen vaginal cream  into the vagina     Past Surgical History:  Procedure Laterality Date   ABDOMINAL HYSTERECTOMY     BIV ICD INSERTION CRT-D N/A 12/31/2020   Procedure: BIV ICD INSERTION CRT-D;  Surgeon: Marinus Maw, MD;  Location: [facility] INVASIVE CV LAB;  Service: Cardiovascular;  Laterality: N/A;   BLEPHAROPLASTY Bilateral    COLON SURGERY     CORONARY ANGIOPLASTY WITH STENT PLACEMENT  2020   X3   CORONARY PRESSURE/FFR STUDY N/A 08/03/2020   Procedure: INTRAVASCULAR PRESSURE WIRE/FFR STUDY;  Surgeon: Tonny Bollman, MD;  Location: [facility] INVASIVE CV LAB;  Service: Cardiovascular;  Laterality: N/A;   CORONARY STENT INTERVENTION N/A 08/03/2020   Procedure: CORONARY STENT INTERVENTION;  Surgeon: Tonny Bollman, MD;  Location: [facility] INVASIVE CV LAB;  Service: Cardiovascular;  Laterality: N/A;   LEFT HEART CATH AND CORONARY ANGIOGRAPHY N/A 08/03/2020   Procedure: LEFT HEART CATH AND CORONARY ANGIOGRAPHY;  Surgeon: Tonny Bollman, MD;  Location: [facility] INVASIVE CV LAB;  Service: Cardiovascular;  Laterality: N/A;   RADIOFREQUENCY ABLATION  07/2023   REFRACTIVE SURGERY Left    [place]   RIGHT/LEFT HEART CATH AND CORONARY ANGIOGRAPHY N/A 10/13/2023   Procedure: RIGHT/LEFT HEART CATH AND CORONARY ANGIOGRAPHY;  Surgeon: Laurey Morale, MD;  Location: [facility] INVASIVE CV LAB;  Service: Cardiovascular;  Laterality: N/A;    Allergies  Allergen Reactions   Bee Venom Anaphylaxis   Sumatriptan Shortness Of Breath    Migraine worsened   Amoxicillin-Pot Clavulanate Diarrhea and Nausea And Vomiting    GI Intolerance   Oxycodone Itching and Hives    Other reaction(s): Other (see comments) "Funny feeling in head"  "off the edge"    Buprenorphine Hcl     Other reaction(s): Itching   Clarithromycin Diarrhea    Abdominal pain   Duloxetine Hcl Other (See Comments)    drowsiness   Hydrocodone Itching   Lactose     Upset  stomach, gas/bloating    Liraglutide Other (See Comments)    Abdominal discomfort   Tramadol Hcl     Other reaction(s): Itching    ROS negative except as stated in HPI   Physical Exam: There were no vitals filed for this visit.  General: The patient is alert and oriented x3 in no acute distress.  Dermatology: Nails x 10 notable for increased thickness, length, dystrophic appearance with discoloration and subungual debris suggestive of mycotic infection.  Tenderness on direct dorsal palpation.  No open wounds or lesions appreciated.  Vascular: DP and PT pulses faintly palpable. Capillary refill approximately 3 seconds to the digits.  Sparse pedal hair growth no erythema or calor.  Neurological: Protective sensation diminished bilaterally via Semmes Weinstein monofilament.  Musculoskeletal Exam: Tenderness on palpation of left medial ankle with deltoid ligament complex and along PT tendon.  Ecchymosis present to this area of local nonpitting edema.  There is some diffuse ankle tenderness right anterior ankle  Radiographic Exam:  Deferred  Assessment/Plan of Care: 1. PVD (  peripheral vascular disease) (HCC)   2. Diabetic polyneuropathy associated with type 2 diabetes mellitus (HCC)   3. Onychomycosis   4. Sprain of deltoid ligament of left ankle, initial encounter      No orders of the defined types were placed in this encounter.  None  Discussed clinical findings with patient today.  Plan: - Nails x 10 debrided in thickness and length using aseptic nail nippers without incident.  Mechanical bur used to thin down the affected nails. -Concern for peripheral vascular disease due to diminished pulses.  Instructed patient to perform daily foot checks, avoid ambulating barefoot and maintain tight glucose control. - Due to comorbidities, defer any oral corticosteroid or oral NSAID therapy at this time - ASO brace dispensed and applied to the left ankle as this is the more acute  injury.  Recommend RICE therapy. - Recommend the use of over-the-counter compression sleeve for ASO brace for right ankle as well. - Recommend the use of good supportive shoe such as sketchers, Brooks, Hoka's, ASICS, new balance. - Follow-up in 1 to 2 months to assess progression with ankle pain.   Hoyt Leanos L. Marchia Bond, AACFAS [facility]     [address] [place], [place] [address]                Office [phone]  Fax [phone]

## 2023-11-05 ENCOUNTER — Ambulatory Visit: Payer: PRIVATE HEALTH INSURANCE | Admitting: Physician Assistant

## 2023-11-11 ENCOUNTER — Encounter (HOSPITAL_COMMUNITY): Payer: Self-pay | Admitting: Cardiology

## 2023-11-11 ENCOUNTER — Ambulatory Visit (HOSPITAL_COMMUNITY)
Admission: RE | Admit: 2023-11-11 | Discharge: 2023-11-11 | Disposition: A | Discharge status: Home / Self Care | Payer: Medicare Other | Source: Ambulatory Visit | Attending: Cardiology | Admitting: Cardiology

## 2023-11-11 ENCOUNTER — Telehealth (HOSPITAL_COMMUNITY): Payer: Self-pay

## 2023-11-11 VITALS — BP 104/60 | HR 82 | Wt 205.8 lb

## 2023-11-11 DIAGNOSIS — Z7982 Long term (current) use of aspirin: Secondary | ICD-10-CM | POA: Insufficient documentation

## 2023-11-11 DIAGNOSIS — R931 Abnormal findings on diagnostic imaging of heart and coronary circulation: Secondary | ICD-10-CM

## 2023-11-11 DIAGNOSIS — I5022 Chronic systolic (congestive) heart failure: Secondary | ICD-10-CM | POA: Diagnosis present

## 2023-11-11 DIAGNOSIS — Z79899 Other long term (current) drug therapy: Secondary | ICD-10-CM | POA: Diagnosis not present

## 2023-11-11 DIAGNOSIS — E1122 Type 2 diabetes mellitus with diabetic chronic kidney disease: Secondary | ICD-10-CM | POA: Diagnosis not present

## 2023-11-11 DIAGNOSIS — I130 Hypertensive heart and chronic kidney disease with heart failure and stage 1 through stage 4 chronic kidney disease, or unspecified chronic kidney disease: Secondary | ICD-10-CM | POA: Diagnosis not present

## 2023-11-11 DIAGNOSIS — I255 Ischemic cardiomyopathy: Secondary | ICD-10-CM | POA: Diagnosis not present

## 2023-11-11 DIAGNOSIS — Z7984 Long term (current) use of oral hypoglycemic drugs: Secondary | ICD-10-CM | POA: Insufficient documentation

## 2023-11-11 DIAGNOSIS — I2510 Atherosclerotic heart disease of native coronary artery without angina pectoris: Secondary | ICD-10-CM | POA: Diagnosis not present

## 2023-11-11 DIAGNOSIS — Z7902 Long term (current) use of antithrombotics/antiplatelets: Secondary | ICD-10-CM | POA: Insufficient documentation

## 2023-11-11 DIAGNOSIS — N183 Chronic kidney disease, stage 3 unspecified: Secondary | ICD-10-CM | POA: Insufficient documentation

## 2023-11-11 DIAGNOSIS — Z794 Long term (current) use of insulin: Secondary | ICD-10-CM | POA: Diagnosis not present

## 2023-11-11 DIAGNOSIS — J449 Chronic obstructive pulmonary disease, unspecified: Secondary | ICD-10-CM | POA: Insufficient documentation

## 2023-11-11 DIAGNOSIS — Z87891 Personal history of nicotine dependence: Secondary | ICD-10-CM | POA: Insufficient documentation

## 2023-11-11 LAB — BASIC METABOLIC PANEL
Anion gap: 8 (ref 5–15)
BUN: 18 mg/dL (ref 8–23)
CO2: 24 mmol/L (ref 22–32)
Calcium: 9.5 mg/dL (ref 8.9–10.3)
Chloride: 107 mmol/L (ref 98–111)
Creatinine, Ser: 1.61 mg/dL — ABNORMAL HIGH (ref 0.44–1.00)
GFR, Estimated: 34 mL/min — ABNORMAL LOW (ref >60)
Glucose, Bld: 162 mg/dL — ABNORMAL HIGH (ref 70–99)
Potassium: 4.2 mmol/L (ref 3.5–5.1)
Sodium: 139 mmol/L (ref 135–145)

## 2023-11-11 MED ORDER — SPIRONOLACTONE 25 MG PO TABS: 25.0000 mg | ORAL_TABLET | Freq: Every day | ORAL | 3 refills | Status: DC

## 2023-11-11 MED ORDER — DIGOXIN 125 MCG PO TABS: 0.0625 mg | ORAL_TABLET | Freq: Every day | ORAL | 3 refills | Status: DC

## 2023-11-11 NOTE — Telephone Encounter (Signed)
Called patient to let her know that Dr. Shirlee Latch wants her to do cardiac rehab. Patient agreeable

## 2023-11-11 NOTE — Patient Instructions (Signed)
Medication Changes:  STOP Imdur  START Digoxin 1/2 tab Daily  Increase Spironolactone to 25 mg (1 tab) Daily  Lab Work:  Labs done today, your results will be available in MyChart, we will contact you for abnormal readings.  Your physician recommends that you return for lab work in: AS SCHEDULED 11/22/24  Testing/Procedures:  none  Referrals:  You have been referred to Dr Myra Gianotti to discuss Barostim implant his office will call you to schedule an appiontment  Special Instructions // Education:  Do the following things EVERYDAY: Weigh yourself in the morning before breakfast. Write it down and keep it in a log. Take your medicines as prescribed Eat low salt foods--Limit salt (sodium) to 2000 mg per day.  Stay as active as you can everyday Limit all fluids for the day to less than 2 liters   Follow-Up in: 3 months   At the [facility], you and your health needs are our priority. We have a designated team specialized in the treatment of Heart Failure. This Care Team includes your primary Heart Failure Specialized Cardiologist (physician), Advanced Practice Providers (APPs- Physician Assistants and Nurse Practitioners), and Pharmacist who all work together to provide you with the care you need, when you need it.   You may see any of the following providers on your designated Care Team at your next follow up:  Dr. Arvilla Meres Dr. Marca Ancona Dr. Dorthula Nettles Dr. Theresia Bough Tonye Becket, NP Robbie Lis, [place] [facility] [place], [place] Brynda Peon, NP [place] Lee, NP Karle Plumber, PharmD   Please be sure to bring in all your medications bottles to every appointment.   Need to Contact us:  If you have any questions or concerns before your next appointment please send [place] a message through [place] or call our office at [phone].    TO LEAVE A MESSAGE FOR THE NURSE SELECT OPTION 2, PLEASE LEAVE A MESSAGE INCLUDING: YOUR  NAME DATE OF BIRTH CALL BACK NUMBER REASON FOR CALL**this is important as we prioritize the call backs  YOU WILL RECEIVE A CALL BACK THE SAME DAY AS LONG AS YOU CALL BEFORE 4:00 PM

## 2023-11-12 NOTE — Progress Notes (Signed)
PCP: Hadley Pen, MD Cardiology: Dr. Servando Salina HF Cardiology: Dr. Shirlee Latch   72 y.o. with history of CAD and ischemic cardiomyopathy and COPD was referred by Dr. Servando Salina for evaluation of CHF.  Patient had PCI to mid/distal LAD in 2020, then had unstable angina in 9/21 with DES to 70% stenosis proximal LAD.  She has had a cardiomyopathy for a number of years, [employer] CRT-D device placed in 2022.  Last echo in 8/24 showed EF < 20%, moderate LV dilation, mild LVH, RV normal.  She additionally has a diagnosis of COPD, quit smoking in 2021. She lives in [place], originally from [place] but moved here to be near family.   LHC/RHC in 11/24 showed patent LAD stents with distal edge dissection in the apical LAD (no change from prior films), normal filling pressures, CI low at 2.12.   Patient returns for followup of CHF.  She continues to have poor energy/fatigue.  No dyspnea walking on flat ground.  She does get short of breath with stairs/inclines.  She raises the head of her bed chronically.  Rare atypical chest pain.  No palpitations/lightheadedness. Weight down 2 lbs.   Labs (7/24): K 4, creatinine 1.45 Labs (10/24): K 4.1, creatinine 1.6, LDL 74  ECG (personally reviewed): NSR, BiV pacing  [employer] device interrogation: HeartLogic 6, >99% BiV pacing, no AF  PMH: 1. Fe deficiency anemia 2. OSA: Does not tolerate CPAP.  3. HTN 4. IBS 5. COPD: Quit smoking in 2021.  6. Depression  7. Type 2 diabetes 8. CAD: PCI mid-distal LAD in 2020.  - LHC (9/21): Unstable angina, 70% proximal LAD treated with DES.  - LHC (11/24): Patent LAD stents with distal edge dissection in the apical LAD (no change from prior films) 9. Chronic systolic CHF: Suspect ischemic cardiomyopathy.  [employer] CRT-D device.  - Echo (8/22): EF 25-30% - Echo (3/23): EF 30% - Echo (8/24): EF < 20%, moderate LV dilation, mild LVH, RV normal - RHC (11/24): mean RA 3, PA 35/9, mean PCWP 9, CI 2.12, PAPi  8.7 10. CKD stage 3  FH: No sudden death or cardiomyopathy  Social History   Socioeconomic History   Marital status: Significant Other    Spouse name: Not on file   Number of children: 3   Years of education: Not on file   Highest education level: Not on file  Occupational History   Occupation: retired  Tobacco Use   Smoking status: Former    Current packs/day: 0.00    Types: Cigarettes    Quit date: 2021    Years since quitting: 3.9   Smokeless tobacco: Never  Vaping Use   Vaping status: Never Used  Substance and Sexual Activity   Alcohol use: Never   Drug use: Never   Sexual activity: Not on file  Other Topics Concern   Not on file  Social History Narrative   Not on file   Social Drivers of Health   Financial Resource Strain: Not on file  Food Insecurity: Low Risk  (10/20/2023)   Received from [facility]   Hunger Vital Sign    Worried About Running Out of Food in the Last Year: Never true    Ran Out of Food in the Last Year: Never true  Transportation Needs: No Transportation Needs (10/20/2023)   Received from [facility]    In the past 12 months, has lack of reliable transportation kept you from medical appointments, meetings, work or from  getting things needed for daily living? : No  Physical Activity: Not on file  Stress: Not on file  Social Connections: Not on file  Intimate Partner Violence: Not on file   ROS: All systems reviewed and negative except as per HPI.   Current Outpatient Medications  Medication Sig Dispense Refill   acetaminophen (TYLENOL) 650 MG CR tablet Take 650 mg by mouth every 8 (eight) hours as needed for pain.     ACETAMINOPHEN-BUTALBITAL 50-325 MG TABS Take 1 tablet by mouth every 4 (four) hours as needed for severe pain.     albuterol (PROVENTIL) (2.5 MG/3ML) 0.083% nebulizer solution Take 2.5 mg by nebulization every 4 (four) hours as needed for wheezing or shortness of breath.     APPLE CIDER VINEGAR PO Take  2 tablets by mouth 2 (two) times daily. With digestive detox     aspirin 81 MG EC tablet Take 81 mg by mouth daily.     baclofen (LIORESAL) 10 MG tablet Take 10 mg by mouth 3 (three) times daily.     BD PEN NEEDLE NANO 2ND GEN 32G X 4 MM MISC 3 (three) times daily.     carvedilol (COREG) 3.125 MG tablet Take 1 tablet (3.125 mg total) by mouth 2 (two) times daily with a meal. 180 tablet 2   clopidogrel (PLAVIX) 75 MG tablet TAKE 1 TABLET(75 MG) BY MOUTH DAILY 90 tablet 3   clotrimazole-betamethasone (LOTRISONE) cream Apply 1 Application topically daily as needed (Vaginal itching).     Continuous Glucose Sensor (DEXCOM G7 SENSOR) MISC SMARTSIG:1 Topical Every 10 Days     dapagliflozin propanediol (FARXIGA) 10 MG TABS tablet TAKE 1 TABLET(10 MG) BY MOUTH DAILY BEFORE BREAKFAST 90 tablet 3   diclofenac Sodium (VOLTAREN) 1 % GEL Apply 2 g topically 2 (two) times daily as needed (pain).     dicyclomine (BENTYL) 10 MG capsule TAKE 1 CAPSULE(10 MG) BY MOUTH IN THE MORNING AND AT BEDTIME (Patient taking differently: Take 10 mg by mouth daily as needed for spasms.) 180 capsule 1   digoxin (LANOXIN) 0.125 MG tablet Take 0.5 tablets (0.0625 mg total) by mouth daily. 15 tablet 3   ENTRESTO 24-26 MG TAKE 1 TABLET BY MOUTH TWICE DAILY 60 tablet 4   ezetimibe (ZETIA) 10 MG tablet Take 1 tablet (10 mg total) by mouth daily. 30 tablet 6   famotidine (PEPCID) 40 MG tablet TAKE 1 TABLET(40 MG) BY MOUTH TWICE DAILY 60 tablet 2   fluticasone (FLONASE) 50 MCG/ACT nasal spray Place 1 spray into both nostrils as needed for allergies or rhinitis.     folic acid (FOLVITE) 1 MG tablet Take 1 mg by mouth daily.     insulin lispro (HUMALOG) 100 UNIT/ML KwikPen Inject 16 Units into the skin 3 (three) times daily with meals.  per sliding scale     lidocaine (LIDODERM) 5 % Place 1 patch onto the skin daily. Remove & Discard patch within 12 hours or as directed by MD (Patient taking differently: Place 1 patch onto the skin daily as  needed (Pain). Remove & Discard patch within 12 hours or as directed by MD) 5 patch 0   loratadine (CLARITIN) 10 MG tablet Take 10 mg by mouth daily.     nitroGLYCERIN (NITROSTAT) 0.4 MG SL tablet DISSOLVE 1 TABLET UNDER TONGUE EVERY 5 MINUTES AS NEEDED FOR CHEST PAIN. 25 tablet 3   pantoprazole (PROTONIX) 40 MG tablet TAKE 1 TABLET(40 MG) BY MOUTH DAILY 90 tablet 3   Plecanatide (TRULANCE)  3 MG TABS Take 1 tablet (3 mg total) by mouth daily. 30 tablet 2   pregabalin (LYRICA) 75 MG capsule Take 75 mg by mouth 2 (two) times daily.     promethazine-dextromethorphan (PROMETHAZINE-DM) 6.25-15 MG/5ML syrup Take 5 mLs by mouth at bedtime as needed.     ranolazine (RANEXA) 500 MG 12 hr tablet Take 1 tablet (500 mg total) by mouth 2 (two) times daily. 180 tablet 3   rosuvastatin (CRESTOR) 40 MG tablet Take 40 mg by mouth daily.     torsemide (DEMADEX) 20 MG tablet 40 mg in the morning and 20 mg in the evening, alternating with 20 mg Twice daily 180 tablet 3   TRESIBA FLEXTOUCH 200 UNIT/ML FlexTouch Pen Inject 40 Units into the skin 2 (two) times daily. 40 units in the morning and up to 45 units at night     TRINTELLIX 5 MG TABS tablet Take 5 mg by mouth at bedtime.     Ubrogepant 50 MG TABS Take 50 mg by mouth daily as needed (Headache).     valACYclovir (VALTREX) 500 MG tablet Take 500 mg by mouth daily.     VERQUVO 5 MG TABS TAKE 1 TABLET BY MOUTH EVERY DAY 90 tablet 3   vitamin B-12 (CYANOCOBALAMIN) 1000 MCG tablet Take 1,000 mcg by mouth daily.     spironolactone (ALDACTONE) 25 MG tablet Take 1 tablet (25 mg total) by mouth daily. 90 tablet 3   No current facility-administered medications for this encounter.   BP 104/60   Pulse 82   Wt 93.4 kg (205 lb 12.8 oz)   SpO2 97%   BMI 34.25 kg/m  General: NAD Neck: No JVD, no thyromegaly or thyroid nodule.  Lungs: Clear to auscultation bilaterally with normal respiratory effort. CV: Nondisplaced PMI.  Heart regular S1/S2, no S3/S4, no murmur.  No  peripheral edema.  No carotid bruit.  Normal pedal pulses.  Abdomen: Soft, nontender, no hepatosplenomegaly, no distention.  Skin: Intact without lesions or rashes.  Neurologic: Alert and oriented x 3.  Psych: Normal affect. Extremities: No clubbing or cyanosis.  HEENT: Normal.   Assessment/Plan: 1. Chronic systolic CHF: Ischemic cardiomyopathy.  [employer] CRT-D device.  Echo in 8/24 showed EF < 20%, moderate LV dilation, mild LVH, RV normal.  This is somewhat worse than priors. LHC/RHC in 11/24 showed low CI at 2.12, normal filling pressures, unchanged coronary anatomy with no severe stenoses. NYHA class III symptoms with prominent fatigue.  She is not volume overloaded on exam.  - Continue torsemide 40 qam/20 qpm alternating with 20 mg bid.   - Increase spironolactone to 25 mg at bedtime. BMET/BNP today, BMET in 10 days.  - Continue Coreg 3.125 mg bid.  - Continue Entresto 24/26 bid - Continue Verquvo 5 mg daily.  - Continue dapagliflozin 10 daily.  - With marginal cardiac output, I will start her on digoxin 0.0625 mg daily (lower dose with elevated creatinine).  Check digoxin level 10 days. - Given ongoing NYHA class III symptoms (fatigue), I think she would be a good baroreceptor activation therapy patient.  We discussed this today, I will refer her to Dr. Myra Gianotti for evaluation.  - Refer to cardiac rehab at the hospital in [place].  2.  CAD: PCI to mid/distal LAD in 2020 and proximal LAD in 2021.  LHC in 11/24 showed stable coronary anatomy with no severe stenoses. She has rare atypical chest pain.  - Continue ranolazine but think she can stop Imdur.  - Continue  ASA/Plavix.  - Continue Crestor + Zetia, goal LDL < 55.  3. COPD: History of smoking, quit 2021.  Should get eventual PFTs.   Followup in 6 wks with APP.   Marca Ancona 11/12/2023

## 2023-11-16 ENCOUNTER — Telehealth (HOSPITAL_COMMUNITY): Payer: Self-pay

## 2023-11-16 NOTE — Telephone Encounter (Signed)
Referral faxed to [facility] cardiac rehab on 11/16/23

## 2023-11-23 ENCOUNTER — Other Ambulatory Visit (HOSPITAL_COMMUNITY): Payer: Medicare Other

## 2023-11-26 ENCOUNTER — Ambulatory Visit (HOSPITAL_COMMUNITY)
Admission: RE | Admit: 2023-11-26 | Discharge: 2023-11-26 | Disposition: A | Discharge status: Home / Self Care | Payer: Medicare Other | Source: Ambulatory Visit | Attending: Cardiology | Admitting: Cardiology

## 2023-11-26 DIAGNOSIS — I5022 Chronic systolic (congestive) heart failure: Secondary | ICD-10-CM | POA: Diagnosis present

## 2023-11-26 LAB — DIGOXIN LEVEL: Digoxin Level: 0.9 ng/mL (ref 0.8–2.0)

## 2023-11-26 LAB — BASIC METABOLIC PANEL
Anion gap: 11 (ref 5–15)
BUN: 24 mg/dL — ABNORMAL HIGH (ref 8–23)
CO2: 23 mmol/L (ref 22–32)
Calcium: 9.4 mg/dL (ref 8.9–10.3)
Chloride: 102 mmol/L (ref 98–111)
Creatinine, Ser: 1.7 mg/dL — ABNORMAL HIGH (ref 0.44–1.00)
GFR, Estimated: 31 mL/min — ABNORMAL LOW (ref >60)
Glucose, Bld: 174 mg/dL — ABNORMAL HIGH (ref 70–99)
Potassium: 4 mmol/L (ref 3.5–5.1)
Sodium: 136 mmol/L (ref 135–145)

## 2023-11-26 LAB — LIPID PANEL
Cholesterol: 109 mg/dL (ref 0–200)
HDL: 47 mg/dL (ref >40)
LDL Cholesterol: 41 mg/dL (ref 0–99)
Total CHOL/HDL Ratio: 2.3 {ratio}
Triglycerides: 105 mg/dL (ref <150)
VLDL: 21 mg/dL (ref 0–40)

## 2023-12-03 ENCOUNTER — Encounter: Payer: Self-pay | Admitting: Cardiology

## 2023-12-04 LAB — MAGNESIUM: Magnesium: 2.1 mg/dL (ref 1.6–2.3)

## 2023-12-07 ENCOUNTER — Telehealth: Payer: Self-pay

## 2023-12-07 NOTE — Telephone Encounter (Signed)
 Patient application placed in outgoing mail. Envelope addressed to: [employer] Patient Assistance Program [address]  [place], [place] [address]

## 2023-12-09 ENCOUNTER — Ambulatory Visit: Payer: Medicare Other | Admitting: Physical Therapy

## 2023-12-14 ENCOUNTER — Ambulatory Visit: Payer: Medicare Other | Admitting: Cardiology

## 2023-12-14 NOTE — Telephone Encounter (Signed)
 Pt came in office for appt today and was late. She stated that she needed to speak w/Jasmine about finishing her paperwork.

## 2023-12-15 ENCOUNTER — Encounter: Payer: Self-pay | Admitting: Podiatry

## 2023-12-15 ENCOUNTER — Ambulatory Visit: Payer: Medicare Other

## 2023-12-15 ENCOUNTER — Ambulatory Visit (INDEPENDENT_AMBULATORY_CARE_PROVIDER_SITE_OTHER): Payer: Medicare Other | Admitting: Podiatry

## 2023-12-15 DIAGNOSIS — M778 Other enthesopathies, not elsewhere classified: Secondary | ICD-10-CM

## 2023-12-15 DIAGNOSIS — M7751 Other enthesopathy of right foot: Secondary | ICD-10-CM | POA: Diagnosis not present

## 2023-12-15 DIAGNOSIS — E1142 Type 2 diabetes mellitus with diabetic polyneuropathy: Secondary | ICD-10-CM

## 2023-12-15 DIAGNOSIS — M25571 Pain in right ankle and joints of right foot: Secondary | ICD-10-CM

## 2023-12-15 MED ORDER — TRIAMCINOLONE ACETONIDE 10 MG/ML IJ SUSP
5.0000 mg | Freq: Once | INTRAMUSCULAR | Status: AC
  Administered 2023-12-15: 5 mg

## 2023-12-15 NOTE — Progress Notes (Signed)
 Chief Complaint  Patient presents with   Ankle Pain    Left Ankle is improved, but she did not wear the boot as much, it is rubbing foot raw. She used and ace wrap for 2 weeks then took that off.  A1c 7.2, Plavix  and ASA    HPI: 73 y.o. female presents for follow-up evaluation of left ankle pain.  She does state this is feeling better now however she was given ASO brace as she did not tolerate well as it did cause some rubbing.  She is diabetic.  A1c 7.2.  Nails were trimmed last visit.  Today she does state that the right ankle has become more sore than before.  She denies any nausea, vomiting, fever, chills, chest pain, shortness of breath.  Past Medical History:  Diagnosis Date   Anemia 12/12/2019   Arthralgia 01/14/2020   Arthritis    Asthma    Atherosclerotic heart disease of native coronary artery without angina pectoris 04/13/2014   CHF (congestive heart failure) (HCC)    Chronic bilateral low back pain with bilateral sciatica 01/13/2019   Chronic bladder pain 11/08/2014   Last Assessment & Plan:  Formatting of this note might be different from the original. Patient has chronic pain syndrome in general I think this is unfortunately worsening her pelvic pain and bladder pain her examination today was very reassuring I am advised her to use the estrogen cream I did start her on Elavil 10 milligrams at that time if she does tolerated will increase it to 25 milligrams.    Chronic headaches    Chronic idiopathic constipation 12/22/2014   Formatting of this note might be different from the original. Last Assessment & Plan:  For better bowel emptying please use either Citrucel / Benefiber start with 2 tablespoons daily and titrate up or down to effect.  Also please use glycerin  suppositories as needed to assist in evacuation. Last Assessment & Plan:  Formatting of this note might be different from the original. For better bowel empt   Chronic obstructive pulmonary disease,  unspecified (HCC) 03/18/2018   Class 1 obesity due to excess calories with serious comorbidity and body mass index (BMI) of 31.0 to 31.9 in adult 06/26/2020   Colon polyps    COPD (chronic obstructive pulmonary disease) (HCC)    Coronary artery disease    DDD (degenerative disc disease), cervical 01/13/2019   Depression 11/05/2019   Diabetic polyneuropathy associated with type 2 diabetes mellitus (HCC) 02/24/2019   Diverticulitis 05/20/2018   Diverticulitis of colon 03/18/2018   Family history of ischemic heart disease (IHD) 08/16/2013   Fibromyalgia affecting multiple sites 01/14/2020   Gastro-esophageal reflux disease without esophagitis 01/13/2019   Glaucoma 11/08/2014   Hordeolum externum of left upper eyelid 03/13/2020   Hypertension 11/08/2014   IBS (irritable bowel syndrome)    Iron deficiency anemia 01/13/2019   Left renal mass 11/08/2019   Leukocytosis 05/28/2018   Low vitamin B12 level 03/13/2020   Low vitamin D level 12/12/2019   LV dysfunction 05/31/2019   Malignant essential hypertension 01/22/2016   Migraine, unspecified, not intractable, without status migrainosus 11/08/2014   Mixed hyperlipidemia 01/13/2019   Myocardial infarction (HCC)    Other premature beats 11/08/2014   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 Last Assessment & Plan:  For vaginal atrophy please place a pea size dab of Estrogen vaginal cream  into the vagina 3 times a week ( Monday, Wednesday, Friday) Last Assessment & Plan:  Formatting of this note might be different from the original. For vaginal atrophy please place a pea size dab  of Estrogen vaginal cream  into the vagina     Past Surgical History:  Procedure Laterality Date   ABDOMINAL HYSTERECTOMY     BIV ICD INSERTION CRT-D N/A 12/31/2020   Procedure: BIV ICD INSERTION CRT-D;  Surgeon: Waddell Danelle ORN, MD;  Location: [facility] INVASIVE CV LAB;  Service: Cardiovascular;  Laterality: N/A;   BLEPHAROPLASTY Bilateral    COLON SURGERY     CORONARY ANGIOPLASTY WITH STENT PLACEMENT  2020   X3   CORONARY PRESSURE/FFR STUDY N/A 08/03/2020   Procedure: INTRAVASCULAR PRESSURE WIRE/FFR STUDY;  Surgeon: Wonda Sharper, MD;  Location: [facility] INVASIVE CV LAB;  Service: Cardiovascular;  Laterality: N/A;   CORONARY STENT INTERVENTION N/A 08/03/2020   Procedure: CORONARY STENT INTERVENTION;  Surgeon: Wonda Sharper, MD;  Location: [facility] INVASIVE CV LAB;  Service: Cardiovascular;  Laterality: N/A;   LEFT HEART CATH AND CORONARY ANGIOGRAPHY N/A 08/03/2020   Procedure: LEFT HEART CATH AND CORONARY ANGIOGRAPHY;  Surgeon: Wonda Sharper, MD;  Location: [facility] INVASIVE CV LAB;  Service: Cardiovascular;  Laterality: N/A;   RADIOFREQUENCY ABLATION  07/2023   REFRACTIVE SURGERY Left    [place]    RIGHT/LEFT HEART CATH AND CORONARY ANGIOGRAPHY N/A 10/13/2023   Procedure: RIGHT/LEFT HEART CATH AND CORONARY ANGIOGRAPHY;  Surgeon: Rolan Ezra RAMAN, MD;  Location[facility] INVASIVE CV LAB;  Service: Cardiovascular;  Laterality: N/A;    Allergies  Allergen Reactions   Bee Venom Anaphylaxis   Sumatriptan Shortness Of Breath    Migraine worsened   Amoxicillin-Pot Clavulanate Diarrhea and Nausea And Vomiting    GI Intolerance   Oxycodone Itching and Hives    Other reaction(s): Other (see comments) Funny feeling in head  off the edge    Buprenorphine Hcl     Other reaction(s): Itching   Clarithromycin Diarrhea    Abdominal pain   Duloxetine  Hcl Other (See Comments)    drowsiness   Hydrocodone Itching   Lactose     Upset stomach, gas/bloating    Liraglutide Other (See Comments)    Abdominal  discomfort   Tramadol Hcl     Other reaction(s): Itching    ROS negative except as stated in HPI   Physical Exam: There were no vitals filed for this visit.  General: The patient is alert and oriented x3 in no acute distress.  Dermatology: Nails x 10 notable for dystrophic appearance with discoloration and subungual debris suggestive of mycotic infection.  Nail plates within normal limits for thickness and length from last nail trim.  No open wounds or lesions appreciated.  Vascular: DP and PT pulses faintly palpable. Capillary refill approximately 3 seconds to the digits.  Sparse pedal hair growth no erythema or calor.  Neurological: Protective sensation diminished bilaterally via Semmes Weinstein monofilament.  Musculoskeletal Exam: Left ankle no pain on palpation today.  Here is some diffuse ankle tenderness right anterior ankle and along the anterior medial gutter.  Some tenderness on palpation of the deltoid ligament.  Does appear to be having some pain on palpation of the sinus tarsi as well.  Radiographic Exam: Right ankle 12/15/2023 Normal osseous mineralization.  Joint spaces preserved.  No acute fractures or acute osseous abnormalities.  Assessment/Plan of Care: 1. Capsulitis of right ankle   2. Diabetic polyneuropathy associated with type 2 diabetes mellitus (HCC)      Meds ordered this encounter  Medications   triamcinolone  acetonide (KENALOG ) 10 MG/ML injection 5 mg   None  Discussed clinical findings with patient today.  Plan: - Left ankle pain appears improved today - Today having more pain to the right ankle. - X-rays reviewed with patient.  Discuss likelihood of soft tissue inflammation.  She does have some sinus tarsi pain however the ankle appears more painful today.  We will address this first. - Verbal consent obtained to proceed with corticosteroid injection to right ankle as described below.  Patient tolerated this well. -Compressive ankle sleeve  dispensed to the patient today as she did not tolerate the ASO brace.  I did advise her to see if she can at least wear the brace some over the next couple days. - Discussed importance of good supportive shoe gear. -Follow-up in approximately 3 weeks to monitor progression   Procedure: Injection ankle joint Discussed alternatives, risks, complications and verbal consent was obtained.  Location: Right ankle. Skin Prep: Betadine. Injectate: 1cc 0.5% marcaine plain, 0.5 cc kenalog   Disposition: Patient tolerated procedure well. Injection site dressed with a band-aid.  Post-injection care was discussed and return precautions discussed.     Aydrian Halpin L. Lamount, DPM, AACFAS [facility]     [address] [place], [place] [address]                Office [phone]  Fax [phone]

## 2023-12-16 ENCOUNTER — Encounter: Payer: Medicare Other | Admitting: Physical Therapy

## 2023-12-18 ENCOUNTER — Other Ambulatory Visit: Payer: Self-pay

## 2023-12-18 DIAGNOSIS — I6523 Occlusion and stenosis of bilateral carotid arteries: Secondary | ICD-10-CM

## 2023-12-21 ENCOUNTER — Ambulatory Visit (INDEPENDENT_AMBULATORY_CARE_PROVIDER_SITE_OTHER): Payer: Medicare Other | Admitting: Podiatry

## 2023-12-21 DIAGNOSIS — S91115A Laceration without foreign body of left lesser toe(s) without damage to nail, initial encounter: Secondary | ICD-10-CM | POA: Diagnosis not present

## 2023-12-21 NOTE — Progress Notes (Unsigned)
Chief Complaint  Patient presents with   Toe Pain    Left 2nd toe, clipped some of the skin off, does not look deep. She wants to make sure it does not get infected. Takes plavix. 7.4    HPI: 73 y.o. female presents with concern of left second toe injury.  Patient states that she was attempting to trim her nails and that she cut the toe along the lateral border.  She wants to come in to ensure there is no infection.  She is diabetic.  A1c 7.4.  She was recently seen in my office for bilateral ankle pain and recently received injection in the right ankle.  She states that she is doing well with this.  She denies any nausea, vomiting, fever, chills, chest pain, shortness of breath.  Past Medical History:  Diagnosis Date   Anemia 12/12/2019   Arthralgia 01/14/2020   Arthritis    Asthma    Atherosclerotic heart disease of native coronary artery without angina pectoris 04/13/2014   CHF (congestive heart failure) (HCC)    Chronic bilateral low back pain with bilateral sciatica 01/13/2019   Chronic bladder pain 11/08/2014   Last Assessment & Plan:  Formatting of this note might be different from the original. Patient has chronic pain syndrome in general I think this is unfortunately worsening her pelvic pain and bladder pain her examination today was very reassuring I am advised her to use the estrogen cream I did start her on Elavil 10 milligrams at that time if she does tolerated will increase it to 25 milligrams.    Chronic headaches    Chronic idiopathic constipation 12/22/2014   Formatting of this note might be different from the original. Last Assessment & Plan:  For better bowel emptying please use either Citrucel / Benefiber start with 2 tablespoons daily and titrate up or down to effect.  Also please use glycerin suppositories as needed to assist in evacuation. Last Assessment & Plan:  Formatting of this note might be different from the original. For better bowel empt   Chronic  obstructive pulmonary disease, unspecified (HCC) 03/18/2018   Class 1 obesity due to excess calories with serious comorbidity and body mass index (BMI) of 31.0 to 31.9 in adult 06/26/2020   Colon polyps    COPD (chronic obstructive pulmonary disease) (HCC)    Coronary artery disease    DDD (degenerative disc disease), cervical 01/13/2019   Depression 11/05/2019   Diabetic polyneuropathy associated with type 2 diabetes mellitus (HCC) 02/24/2019   Diverticulitis 05/20/2018   Diverticulitis of colon 03/18/2018   Family history of ischemic heart disease (IHD) 08/16/2013   Fibromyalgia affecting multiple sites 01/14/2020   Gastro-esophageal reflux disease without esophagitis 01/13/2019   Glaucoma 11/08/2014   Hordeolum externum of left upper eyelid 03/13/2020   Hypertension 11/08/2014   IBS (irritable bowel syndrome)    Iron deficiency anemia 01/13/2019   Left renal mass 11/08/2019   Leukocytosis 05/28/2018   Low vitamin B12 level 03/13/2020   Low vitamin D level 12/12/2019   LV dysfunction 05/31/2019   Malignant essential hypertension 01/22/2016   Migraine, unspecified, not intractable, without status migrainosus 11/08/2014   Mixed hyperlipidemia 01/13/2019   Myocardial infarction (HCC)    Other premature beats 11/08/2014   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 Last Assessment & Plan:  For vaginal atrophy please place a pea size dab of Estrogen vaginal cream  into the vagina 3 times a week ( Monday, Wednesday, Friday) Last Assessment & Plan:  Formatting of this note might be different from the original. For vaginal  atrophy please place a pea size dab of Estrogen vaginal cream  into the vagina     Past Surgical History:  Procedure Laterality Date   ABDOMINAL HYSTERECTOMY     BIV ICD INSERTION CRT-D N/A 12/31/2020   Procedure: BIV ICD INSERTION CRT-D;  Surgeon: Marinus Maw, MD;  Location: [facility] INVASIVE CV LAB;  Service: Cardiovascular;  Laterality: N/A;   BLEPHAROPLASTY Bilateral    COLON SURGERY     CORONARY ANGIOPLASTY WITH STENT PLACEMENT  2020   X3   CORONARY PRESSURE/FFR STUDY N/A 08/03/2020   Procedure: INTRAVASCULAR PRESSURE WIRE/FFR STUDY;  Surgeon: Tonny Bollman, MD;  Location: [facility] INVASIVE CV LAB;  Service: Cardiovascular;  Laterality: N/A;   CORONARY STENT INTERVENTION N/A 08/03/2020   Procedure: CORONARY STENT INTERVENTION;  Surgeon: Tonny Bollman, MD;  Location: [facility] INVASIVE CV LAB;  Service: Cardiovascular;  Laterality: N/A;   LEFT HEART CATH AND CORONARY ANGIOGRAPHY N/A 08/03/2020   Procedure: LEFT HEART CATH AND CORONARY ANGIOGRAPHY;  Surgeon: Tonny Bollman, MD;  Location: [facility] INVASIVE CV LAB;  Service: Cardiovascular;  Laterality: N/A;   RADIOFREQUENCY ABLATION  07/2023   REFRACTIVE SURGERY Left    [place]   RIGHT/LEFT HEART CATH AND CORONARY ANGIOGRAPHY N/A 10/13/2023   Procedure: RIGHT/LEFT HEART CATH AND CORONARY ANGIOGRAPHY;  Surgeon: Laurey Morale, MD;  Location: [facility] INVASIVE CV LAB;  Service: Cardiovascular;  Laterality: N/A;    Allergies  Allergen Reactions   Bee Venom Anaphylaxis   Sumatriptan Shortness Of Breath    Migraine worsened   Amoxicillin-Pot Clavulanate Diarrhea and Nausea And Vomiting    GI Intolerance   Oxycodone Itching and Hives    Other reaction(s): Other (see comments) "Funny feeling in head"  "off the edge"    Buprenorphine Hcl     Other reaction(s): Itching   Clarithromycin Diarrhea    Abdominal pain   Duloxetine Hcl Other (See Comments)    drowsiness   Hydrocodone Itching   Lactose     Upset stomach, gas/bloating    Liraglutide  Other (See Comments)    Abdominal discomfort   Tramadol Hcl     Other reaction(s): Itching    ROS negative except as stated in HPI   Physical Exam: There were no vitals filed for this visit.  General: The patient is alert and oriented x3 in no acute distress.  Dermatology: Nails x 10 notable for dystrophic appearance with discoloration and subungual debris suggestive of mycotic infection.  Nail plates within normal limits for thickness and length from last nail trim.  Superficial laceration left second toe lateral aspect limited to partial-thickness breakdown of skin.  Some scab formation present.  No erythema or edema present.  No signs of acute bacterial infection.  Vascular: DP and PT pulses faintly palpable. Capillary refill approximately 3 seconds to the digits.  Sparse pedal hair growth no erythema or calor.  Neurological: Protective sensation diminished bilaterally via Semmes Weinstein monofilament.  Musculoskeletal Exam: No gross orthopedic deformities  Assessment/Plan of Care: 1. Laceration of lesser toe of left foot without  foreign body present or damage to nail, initial encounter      No orders of the defined types were placed in this encounter.  None  Discussed clinical findings with patient today.  Plan: - No signs of acute infection to left second toe - Laceration superficial nature - Antibiotic ointment and bandage applied - Patient may continue to apply antibiotic ointment over-the-counter for the next 5 to 7 days and keep area covered with Band-Aid. - Toenail of the left second toe trimmed today as a courtesy. - Patient keep original scheduled follow-up with me for her right ankle pain   Tasmia Blumer L. Marchia Bond, AACFAS [facility]     [address] [place], [place] [address]                Office [phone]  Fax [phone]

## 2023-12-23 ENCOUNTER — Encounter: Payer: Medicare Other | Admitting: Physical Therapy

## 2023-12-23 ENCOUNTER — Encounter: Payer: Self-pay | Admitting: Podiatry

## 2023-12-23 ENCOUNTER — Telehealth: Payer: Self-pay | Admitting: Cardiology

## 2023-12-23 NOTE — Telephone Encounter (Signed)
I do have the paperwork.

## 2023-12-23 NOTE — Telephone Encounter (Signed)
Made several attempts to fax patient assistance forms to number provided by patient. I received a no answer message for both numbers. I will call the company tomorrow to try to get a new number.

## 2023-12-23 NOTE — Telephone Encounter (Signed)
Patient called to the the program didn't get her paperwork. Stated that their fax # change. [phone]. Please advise

## 2023-12-23 NOTE — Telephone Encounter (Signed)
Late entry: Spoke with pt, received her paperwork. Paperwork has been signed by Dr. Servando Salina and faxed.

## 2023-12-24 NOTE — Telephone Encounter (Signed)
Faxed patient assistance paper work to [phone]. Fax accepted. Paper work given back to [employer], Charity fundraiser.

## 2023-12-28 ENCOUNTER — Encounter: Payer: Self-pay | Admitting: Surgery

## 2023-12-28 ENCOUNTER — Ambulatory Visit (INDEPENDENT_AMBULATORY_CARE_PROVIDER_SITE_OTHER): Payer: Medicare Other | Admitting: Surgery

## 2023-12-28 ENCOUNTER — Ambulatory Visit (HOSPITAL_COMMUNITY)
Admission: RE | Admit: 2023-12-28 | Discharge: 2023-12-28 | Disposition: A | Discharge status: Home / Self Care | Payer: Medicare Other | Source: Ambulatory Visit | Attending: Surgery | Admitting: Surgery

## 2023-12-28 VITALS — BP 99/65 | HR 75 | Temp 97.8°F | Resp 20 | Ht 65.0 in | Wt 200.0 lb

## 2023-12-28 DIAGNOSIS — I5022 Chronic systolic (congestive) heart failure: Secondary | ICD-10-CM | POA: Diagnosis not present

## 2023-12-28 DIAGNOSIS — I6523 Occlusion and stenosis of bilateral carotid arteries: Secondary | ICD-10-CM | POA: Diagnosis not present

## 2023-12-28 NOTE — H&P (View-Only) (Signed)
[facility]  Patient name: Karen Warner MRN: [id number] DOB: 1951/01/30 Sex: female   REQUESTING PROVIDER:    Dr. Shirlee Latch   REASON FOR CONSULT:    Barostim Eval  HISTORY OF PRESENT ILLNESS:   Karen Warner is a 73 y.o. female, who is referred for Barostim evaluation.  The patient suffers from ischemic cardiomyopathy (chronic systolic).  Most we could not echocardiogram shows an ejection fraction less than 20%.  The patient remains symptomatic despite goal-directed medical therapy.  Her biggest complaints are fatigue and shortness of breath.  The patient has a ICD in place [employer]) from 2022.  The patient suffers from COPD secondary to a history of tobacco abuse.  She has a history of PCI for unstable angina.  She takes a statin for hypercholesterolemia.  PAST MEDICAL HISTORY    Past Medical History:  Diagnosis Date   Anemia 12/12/2019   Arthralgia 01/14/2020   Arthritis    Asthma    Atherosclerotic heart disease of native coronary artery without angina pectoris 04/13/2014   CHF (congestive heart failure) (HCC)    Chronic bilateral low back pain with bilateral sciatica 01/13/2019   Chronic bladder pain 11/08/2014   Last Assessment & Plan:  Formatting of this note might be different from the original. Patient has chronic pain syndrome in general I think this is unfortunately worsening her pelvic pain and bladder pain her examination today was very reassuring I am advised her to use the estrogen cream I did start her on Elavil 10 milligrams at that time if she does tolerated will increase it to 25 milligrams.    Chronic headaches    Chronic idiopathic constipation 12/22/2014   Formatting of this note might be different from the original. Last Assessment & Plan:  For better bowel emptying please use either Citrucel / Benefiber start with 2 tablespoons daily and titrate up or down to  effect.  Also please use glycerin suppositories as needed to assist in evacuation. Last Assessment & Plan:  Formatting of this note might be different from the original. For better bowel empt   Chronic obstructive pulmonary disease, unspecified (HCC) 03/18/2018   Class 1 obesity due to excess calories with serious comorbidity and body mass index (BMI) of 31.0 to 31.9 in adult 06/26/2020   Colon polyps    COPD (chronic obstructive pulmonary disease) (HCC)    Coronary artery disease    DDD (degenerative disc disease), cervical 01/13/2019   Depression 11/05/2019   Diabetic polyneuropathy associated with type 2 diabetes mellitus (HCC) 02/24/2019   Diverticulitis 05/20/2018   Diverticulitis of colon 03/18/2018   Family history of ischemic heart disease (IHD) 08/16/2013   Fibromyalgia affecting multiple sites 01/14/2020   Gastro-esophageal reflux disease without esophagitis 01/13/2019   Glaucoma 11/08/2014   Hordeolum externum of left upper eyelid 03/13/2020   Hypertension 11/08/2014   IBS (irritable bowel syndrome)    Iron deficiency anemia 01/13/2019   Left renal mass 11/08/2019   Leukocytosis 05/28/2018   Low vitamin B12 level 03/13/2020   Low vitamin D level 12/12/2019   LV dysfunction 05/31/2019   Malignant essential hypertension 01/22/2016   Migraine, unspecified, not intractable, without status migrainosus 11/08/2014   Mixed hyperlipidemia 01/13/2019   Myocardial infarction (HCC)    Other premature beats 11/08/2014   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 Last Assessment & Plan:  For vaginal atrophy please place a  pea size dab of Estrogen vaginal cream  into the vagina 3 times a week ( Monday, Wednesday, Friday) Last Assessment & Plan:  Formatting of this note might be different from the original. For vaginal atrophy please place a pea size dab of Estrogen vaginal cream  into the vagina      FAMILY HISTORY   Family History  Problem Relation Age of Onset   Lung cancer Mother    Coronary artery disease Mother    Diabetes Mother    Glaucoma Mother    Hypertension Mother    Colon polyps Mother    Migraines Father    Heart disease Father    Diabetes Maternal Grandmother    Diabetes Daughter    Migraines Son    Hypertension Brother     SOCIAL HISTORY:   Social History   Socioeconomic History   Marital status: Significant Other    Spouse name: Not on file   Number of children: 3   Years of education: Not on file   Highest education level: Not on file  Occupational History   Occupation: retired  Tobacco Use   Smoking status: Former    Current packs/day: 0.00    Types: Cigarettes    Quit date: 2021    Years since quitting: 4.0   Smokeless tobacco: Never  Vaping Use   Vaping status: Never Used  Substance and Sexual Activity   Alcohol use: Never   Drug use: Never   Sexual activity: Not on file  Other Topics Concern   Not on file  Social History Narrative   Not on file   Social Drivers of Health   Financial Resource Strain: Not on file  Food Insecurity: Low Risk  (10/20/2023)   Received from [facility]   Hunger Vital Sign    Worried About Running Out of Food in the Last Year: Never true    Ran Out of Food in the Last Year: Never true  Transportation Needs: No Transportation Needs (10/20/2023)   Received from [facility]    In the past 12 months, has lack of reliable transportation kept you from medical appointments, meetings, work or from getting things needed for daily living? : No  Physical Activity: Not on file  Stress: Not on file  Social  Connections: Not on file  Intimate Partner Violence: Not on file    ALLERGIES:    Allergies  Allergen Reactions   Bee Venom Anaphylaxis   Sumatriptan Shortness Of Breath    Migraine worsened   Amoxicillin-Pot Clavulanate Diarrhea and Nausea And Vomiting    GI Intolerance   Oxycodone Itching and Hives    Other reaction(s): Other (see comments) "Funny feeling in head"  "off the edge"    Buprenorphine Hcl     Other reaction(s): Itching   Clarithromycin Diarrhea    Abdominal pain   Duloxetine Hcl Other (See Comments)    drowsiness   Hydrocodone Itching   Lactose     Upset stomach, gas/bloating    Liraglutide Other (See Comments)    Abdominal discomfort   Tramadol Hcl     Other reaction(s): Itching    CURRENT MEDICATIONS:    Current Outpatient  Medications  Medication Sig Dispense Refill   acetaminophen (TYLENOL) 650 MG CR tablet Take 650 mg by mouth every 8 (eight) hours as needed for pain.     ACETAMINOPHEN-BUTALBITAL 50-325 MG TABS Take 1 tablet by mouth every 4 (four) hours as needed for severe pain.     albuterol (PROVENTIL) (2.5 MG/3ML) 0.083% nebulizer solution Take 2.5 mg by nebulization every 4 (four) hours as needed for wheezing or shortness of breath.     APPLE CIDER VINEGAR PO Take 2 tablets by mouth 2 (two) times daily. With digestive detox     aspirin 81 MG EC tablet Take 81 mg by mouth daily.     baclofen (LIORESAL) 10 MG tablet Take 10 mg by mouth 3 (three) times daily.     BD PEN NEEDLE NANO 2ND GEN 32G X 4 MM MISC 3 (three) times daily.     carvedilol (COREG) 3.125 MG tablet Take 1 tablet (3.125 mg total) by mouth 2 (two) times daily with a meal. 180 tablet 2   clopidogrel (PLAVIX) 75 MG tablet TAKE 1 TABLET(75 MG) BY MOUTH DAILY 90 tablet 3   clotrimazole-betamethasone (LOTRISONE) cream Apply 1 Application topically daily as needed (Vaginal itching).     Continuous Glucose Sensor (DEXCOM G7 SENSOR) MISC SMARTSIG:1 Topical Every 10 Days     dapagliflozin  propanediol (FARXIGA) 10 MG TABS tablet TAKE 1 TABLET(10 MG) BY MOUTH DAILY BEFORE BREAKFAST 90 tablet 3   diclofenac Sodium (VOLTAREN) 1 % GEL Apply 2 g topically 2 (two) times daily as needed (pain).     dicyclomine (BENTYL) 10 MG capsule TAKE 1 CAPSULE(10 MG) BY MOUTH IN THE MORNING AND AT BEDTIME (Patient taking differently: Take 10 mg by mouth daily as needed for spasms.) 180 capsule 1   digoxin (LANOXIN) 0.125 MG tablet Take 0.5 tablets (0.0625 mg total) by mouth daily. 15 tablet 3   ENTRESTO 24-26 MG TAKE 1 TABLET BY MOUTH TWICE DAILY 60 tablet 4   famotidine (PEPCID) 40 MG tablet TAKE 1 TABLET(40 MG) BY MOUTH TWICE DAILY 60 tablet 2   fluticasone (FLONASE) 50 MCG/ACT nasal spray Place 1 spray into both nostrils as needed for allergies or rhinitis.     folic acid (FOLVITE) 1 MG tablet Take 1 mg by mouth daily.     insulin lispro (HUMALOG) 100 UNIT/ML KwikPen Inject 16 Units into the skin 3 (three) times daily with meals.  per sliding scale     lidocaine (LIDODERM) 5 % Place 1 patch onto the skin daily. Remove & Discard patch within 12 hours or as directed by MD (Patient taking differently: Place 1 patch onto the skin daily as needed (Pain). Remove & Discard patch within 12 hours or as directed by MD) 5 patch 0   loratadine (CLARITIN) 10 MG tablet Take 10 mg by mouth daily.     nitroGLYCERIN (NITROSTAT) 0.4 MG SL tablet DISSOLVE 1 TABLET UNDER TONGUE EVERY 5 MINUTES AS NEEDED FOR CHEST PAIN. 25 tablet 3   pantoprazole (PROTONIX) 40 MG tablet TAKE 1 TABLET(40 MG) BY MOUTH DAILY 90 tablet 3   Plecanatide (TRULANCE) 3 MG TABS Take 1 tablet (3 mg total) by mouth daily. 30 tablet 2   pregabalin (LYRICA) 75 MG capsule Take 75 mg by mouth 2 (two) times daily.     promethazine-dextromethorphan (PROMETHAZINE-DM) 6.25-15 MG/5ML syrup Take 5 mLs by mouth at bedtime as needed.     ranolazine (RANEXA) 500 MG 12 hr tablet Take 1 tablet (500 mg total) by mouth  2 (two) times daily. 180 tablet 3   rosuvastatin  (CRESTOR) 40 MG tablet Take 40 mg by mouth daily.     spironolactone (ALDACTONE) 25 MG tablet Take 1 tablet (25 mg total) by mouth daily. 90 tablet 3   torsemide (DEMADEX) 20 MG tablet 40 mg in the morning and 20 mg in the evening, alternating with 20 mg Twice daily 180 tablet 3   TRESIBA FLEXTOUCH 200 UNIT/ML FlexTouch Pen Inject 40 Units into the skin 2 (two) times daily. 40 units in the morning and up to 45 units at night     TRINTELLIX 5 MG TABS tablet Take 5 mg by mouth at bedtime.     Ubrogepant 50 MG TABS Take 50 mg by mouth daily as needed (Headache).     valACYclovir (VALTREX) 500 MG tablet Take 500 mg by mouth daily.     VERQUVO 5 MG TABS TAKE 1 TABLET BY MOUTH EVERY DAY 90 tablet 3   vitamin B-12 (CYANOCOBALAMIN) 1000 MCG tablet Take 1,000 mcg by mouth daily.     ezetimibe (ZETIA) 10 MG tablet Take 1 tablet (10 mg total) by mouth daily. 30 tablet 6   No current facility-administered medications for this visit.    REVIEW OF SYSTEMS:   [X]  denotes positive finding, [ ]  denotes negative finding Cardiac  Comments:  Chest pain or chest pressure:    Shortness of breath upon exertion:    Short of breath when lying flat:    Irregular heart rhythm:        Vascular    Pain in calf, thigh, or hip brought on by ambulation:    Pain in feet at night that wakes you up from your sleep:     Blood clot in your veins:    Leg swelling:         Pulmonary    Oxygen at home:    Productive cough:     Wheezing:         Neurologic    Sudden weakness in arms or legs:     Sudden numbness in arms or legs:     Sudden onset of difficulty speaking or slurred speech:    Temporary loss of vision in one eye:     Problems with dizziness:         Gastrointestinal    Blood in stool:      Vomited blood:         Genitourinary    Burning when urinating:     Blood in urine:        Psychiatric    Major depression:         Hematologic    Bleeding problems:    Problems with blood clotting too  easily:        Skin    Rashes or ulcers:        Constitutional    Fever or chills:     PHYSICAL EXAM:   Vitals:   12/28/23 1338  BP: 99/65  Pulse: 75  Resp: 20  Temp: 97.8 F (36.6 C)  SpO2: 98%  Weight: 200 lb (90.7 kg)  Height: 5\' 5"  (1.651 m)    GENERAL: The patient is a well-nourished female, in no acute distress. The vital signs are documented above. CARDIAC: There is a regular rate and rhythm.  VASCULAR: Carotid bifurcation is a little on the high side PULMONARY: Nonlabored respirations ABDOMEN: Soft and non-tender with normal pitched bowel sounds.  MUSCULOSKELETAL: There are no major deformities  or cyanosis. NEUROLOGIC: No focal weakness or paresthesias are detected. SKIN: There are no ulcers or rashes noted. PSYCHIATRIC: The patient has a normal affect.  STUDIES:   I have reviewed the following duplex: Right Carotid: Velocities in the right ICA are consistent with a 1-39%  stenosis.   Left Carotid: Velocities in the left ICA are consistent with a 1-39%  stenosis.   Vertebrals: Bilateral vertebral arteries demonstrate antegrade flow.    ASSESSMENT and PLAN   NYHA a class III: The patient continues to have symptoms of shortness of breath and fatigue despite goal-directed medical therapy.  Her biggest goal is to start cooking again but she does not have the energy for that.  I think she would be an excellent candidate for Barostim therapy.  I will work on Therapist, occupational.   [name], Cherrie Gauze, MD, FACS [facility] [phone] Pager [phone]

## 2023-12-28 NOTE — Progress Notes (Signed)
[facility]  Patient name: Karen Warner MRN: [id number] DOB: 1951-12-01 Sex: female   REQUESTING PROVIDER:    Dr. Shirlee Latch   REASON FOR CONSULT:    Barostim Eval  HISTORY OF PRESENT ILLNESS:   Karen Warner is a 73 y.o. female, who is referred for Barostim evaluation.  The patient suffers from ischemic cardiomyopathy (chronic systolic).  Most we could not echocardiogram shows an ejection fraction less than 20%.  The patient remains symptomatic despite goal-directed medical therapy.  Her biggest complaints are fatigue and shortness of breath.  The patient has a ICD in place [employer]) from 2022.  The patient suffers from COPD secondary to a history of tobacco abuse.  She has a history of PCI for unstable angina.  She takes a statin for hypercholesterolemia.  PAST MEDICAL HISTORY    Past Medical History:  Diagnosis Date   Anemia 12/12/2019   Arthralgia 01/14/2020   Arthritis    Asthma    Atherosclerotic heart disease of native coronary artery without angina pectoris 04/13/2014   CHF (congestive heart failure) (HCC)    Chronic bilateral low back pain with bilateral sciatica 01/13/2019   Chronic bladder pain 11/08/2014   Last Assessment & Plan:  Formatting of this note might be different from the original. Patient has chronic pain syndrome in general I think this is unfortunately worsening her pelvic pain and bladder pain her examination today was very reassuring I am advised her to use the estrogen cream I did start her on Elavil 10 milligrams at that time if she does tolerated will increase it to 25 milligrams.    Chronic headaches    Chronic idiopathic constipation 12/22/2014   Formatting of this note might be different from the original. Last Assessment & Plan:  For better bowel emptying please use either Citrucel / Benefiber start with 2 tablespoons daily and titrate up or down to  effect.  Also please use glycerin suppositories as needed to assist in evacuation. Last Assessment & Plan:  Formatting of this note might be different from the original. For better bowel empt   Chronic obstructive pulmonary disease, unspecified (HCC) 03/18/2018   Class 1 obesity due to excess calories with serious comorbidity and body mass index (BMI) of 31.0 to 31.9 in adult 06/26/2020   Colon polyps    COPD (chronic obstructive pulmonary disease) (HCC)    Coronary artery disease    DDD (degenerative disc disease), cervical 01/13/2019   Depression 11/05/2019   Diabetic polyneuropathy associated with type 2 diabetes mellitus (HCC) 02/24/2019   Diverticulitis 05/20/2018   Diverticulitis of colon 03/18/2018   Family history of ischemic heart disease (IHD) 08/16/2013   Fibromyalgia affecting multiple sites 01/14/2020   Gastro-esophageal reflux disease without esophagitis 01/13/2019   Glaucoma 11/08/2014   Hordeolum externum of left upper eyelid 03/13/2020   Hypertension 11/08/2014   IBS (irritable bowel syndrome)    Iron deficiency anemia 01/13/2019   Left renal mass 11/08/2019   Leukocytosis 05/28/2018   Low vitamin B12 level 03/13/2020   Low vitamin D level 12/12/2019   LV dysfunction 05/31/2019   Malignant essential hypertension 01/22/2016   Migraine, unspecified, not intractable, without status migrainosus 11/08/2014   Mixed hyperlipidemia 01/13/2019   Myocardial infarction (HCC)    Other premature beats 11/08/2014   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 Last Assessment & Plan:  For vaginal atrophy please place a  pea size dab of Estrogen vaginal cream  into the vagina 3 times a week ( Monday, Wednesday, Friday) Last Assessment & Plan:  Formatting of this note might be different from the original. For vaginal atrophy please place a pea size dab of Estrogen vaginal cream  into the vagina      FAMILY HISTORY   Family History  Problem Relation Age of Onset   Lung cancer Mother    Coronary artery disease Mother    Diabetes Mother    Glaucoma Mother    Hypertension Mother    Colon polyps Mother    Migraines Father    Heart disease Father    Diabetes Maternal Grandmother    Diabetes Daughter    Migraines Son    Hypertension Brother     SOCIAL HISTORY:   Social History   Socioeconomic History   Marital status: Significant Other    Spouse name: Not on file   Number of children: 3   Years of education: Not on file   Highest education level: Not on file  Occupational History   Occupation: retired  Tobacco Use   Smoking status: Former    Current packs/day: 0.00    Types: Cigarettes    Quit date: 2021    Years since quitting: 4.0   Smokeless tobacco: Never  Vaping Use   Vaping status: Never Used  Substance and Sexual Activity   Alcohol use: Never   Drug use: Never   Sexual activity: Not on file  Other Topics Concern   Not on file  Social History Narrative   Not on file   Social Drivers of Health   Financial Resource Strain: Not on file  Food Insecurity: Low Risk  (10/20/2023)   Received from [facility]   Hunger Vital Sign    Worried About Running Out of Food in the Last Year: Never true    Ran Out of Food in the Last Year: Never true  Transportation Needs: No Transportation Needs (10/20/2023)   Received from [facility]    In the past 12 months, has lack of reliable transportation kept you from medical appointments, meetings, work or from getting things needed for daily living? : No  Physical Activity: Not on file  Stress: Not on file  Social  Connections: Not on file  Intimate Partner Violence: Not on file    ALLERGIES:    Allergies  Allergen Reactions   Bee Venom Anaphylaxis   Sumatriptan Shortness Of Breath    Migraine worsened   Amoxicillin-Pot Clavulanate Diarrhea and Nausea And Vomiting    GI Intolerance   Oxycodone Itching and Hives    Other reaction(s): Other (see comments) "Funny feeling in head"  "off the edge"    Buprenorphine Hcl     Other reaction(s): Itching   Clarithromycin Diarrhea    Abdominal pain   Duloxetine Hcl Other (See Comments)    drowsiness   Hydrocodone Itching   Lactose     Upset stomach, gas/bloating    Liraglutide Other (See Comments)    Abdominal discomfort   Tramadol Hcl     Other reaction(s): Itching    CURRENT MEDICATIONS:    Current Outpatient  Medications  Medication Sig Dispense Refill   acetaminophen (TYLENOL) 650 MG CR tablet Take 650 mg by mouth every 8 (eight) hours as needed for pain.     ACETAMINOPHEN-BUTALBITAL 50-325 MG TABS Take 1 tablet by mouth every 4 (four) hours as needed for severe pain.     albuterol (PROVENTIL) (2.5 MG/3ML) 0.083% nebulizer solution Take 2.5 mg by nebulization every 4 (four) hours as needed for wheezing or shortness of breath.     APPLE CIDER VINEGAR PO Take 2 tablets by mouth 2 (two) times daily. With digestive detox     aspirin 81 MG EC tablet Take 81 mg by mouth daily.     baclofen (LIORESAL) 10 MG tablet Take 10 mg by mouth 3 (three) times daily.     BD PEN NEEDLE NANO 2ND GEN 32G X 4 MM MISC 3 (three) times daily.     carvedilol (COREG) 3.125 MG tablet Take 1 tablet (3.125 mg total) by mouth 2 (two) times daily with a meal. 180 tablet 2   clopidogrel (PLAVIX) 75 MG tablet TAKE 1 TABLET(75 MG) BY MOUTH DAILY 90 tablet 3   clotrimazole-betamethasone (LOTRISONE) cream Apply 1 Application topically daily as needed (Vaginal itching).     Continuous Glucose Sensor (DEXCOM G7 SENSOR) MISC SMARTSIG:1 Topical Every 10 Days     dapagliflozin  propanediol (FARXIGA) 10 MG TABS tablet TAKE 1 TABLET(10 MG) BY MOUTH DAILY BEFORE BREAKFAST 90 tablet 3   diclofenac Sodium (VOLTAREN) 1 % GEL Apply 2 g topically 2 (two) times daily as needed (pain).     dicyclomine (BENTYL) 10 MG capsule TAKE 1 CAPSULE(10 MG) BY MOUTH IN THE MORNING AND AT BEDTIME (Patient taking differently: Take 10 mg by mouth daily as needed for spasms.) 180 capsule 1   digoxin (LANOXIN) 0.125 MG tablet Take 0.5 tablets (0.0625 mg total) by mouth daily. 15 tablet 3   ENTRESTO 24-26 MG TAKE 1 TABLET BY MOUTH TWICE DAILY 60 tablet 4   famotidine (PEPCID) 40 MG tablet TAKE 1 TABLET(40 MG) BY MOUTH TWICE DAILY 60 tablet 2   fluticasone (FLONASE) 50 MCG/ACT nasal spray Place 1 spray into both nostrils as needed for allergies or rhinitis.     folic acid (FOLVITE) 1 MG tablet Take 1 mg by mouth daily.     insulin lispro (HUMALOG) 100 UNIT/ML KwikPen Inject 16 Units into the skin 3 (three) times daily with meals.  per sliding scale     lidocaine (LIDODERM) 5 % Place 1 patch onto the skin daily. Remove & Discard patch within 12 hours or as directed by MD (Patient taking differently: Place 1 patch onto the skin daily as needed (Pain). Remove & Discard patch within 12 hours or as directed by MD) 5 patch 0   loratadine (CLARITIN) 10 MG tablet Take 10 mg by mouth daily.     nitroGLYCERIN (NITROSTAT) 0.4 MG SL tablet DISSOLVE 1 TABLET UNDER TONGUE EVERY 5 MINUTES AS NEEDED FOR CHEST PAIN. 25 tablet 3   pantoprazole (PROTONIX) 40 MG tablet TAKE 1 TABLET(40 MG) BY MOUTH DAILY 90 tablet 3   Plecanatide (TRULANCE) 3 MG TABS Take 1 tablet (3 mg total) by mouth daily. 30 tablet 2   pregabalin (LYRICA) 75 MG capsule Take 75 mg by mouth 2 (two) times daily.     promethazine-dextromethorphan (PROMETHAZINE-DM) 6.25-15 MG/5ML syrup Take 5 mLs by mouth at bedtime as needed.     ranolazine (RANEXA) 500 MG 12 hr tablet Take 1 tablet (500 mg total) by mouth  2 (two) times daily. 180 tablet 3   rosuvastatin  (CRESTOR) 40 MG tablet Take 40 mg by mouth daily.     spironolactone (ALDACTONE) 25 MG tablet Take 1 tablet (25 mg total) by mouth daily. 90 tablet 3   torsemide (DEMADEX) 20 MG tablet 40 mg in the morning and 20 mg in the evening, alternating with 20 mg Twice daily 180 tablet 3   TRESIBA FLEXTOUCH 200 UNIT/ML FlexTouch Pen Inject 40 Units into the skin 2 (two) times daily. 40 units in the morning and up to 45 units at night     TRINTELLIX 5 MG TABS tablet Take 5 mg by mouth at bedtime.     Ubrogepant 50 MG TABS Take 50 mg by mouth daily as needed (Headache).     valACYclovir (VALTREX) 500 MG tablet Take 500 mg by mouth daily.     VERQUVO 5 MG TABS TAKE 1 TABLET BY MOUTH EVERY DAY 90 tablet 3   vitamin B-12 (CYANOCOBALAMIN) 1000 MCG tablet Take 1,000 mcg by mouth daily.     ezetimibe (ZETIA) 10 MG tablet Take 1 tablet (10 mg total) by mouth daily. 30 tablet 6   No current facility-administered medications for this visit.    REVIEW OF SYSTEMS:   [X]  denotes positive finding, [ ]  denotes negative finding Cardiac  Comments:  Chest pain or chest pressure:    Shortness of breath upon exertion:    Short of breath when lying flat:    Irregular heart rhythm:        Vascular    Pain in calf, thigh, or hip brought on by ambulation:    Pain in feet at night that wakes you up from your sleep:     Blood clot in your veins:    Leg swelling:         Pulmonary    Oxygen at home:    Productive cough:     Wheezing:         Neurologic    Sudden weakness in arms or legs:     Sudden numbness in arms or legs:     Sudden onset of difficulty speaking or slurred speech:    Temporary loss of vision in one eye:     Problems with dizziness:         Gastrointestinal    Blood in stool:      Vomited blood:         Genitourinary    Burning when urinating:     Blood in urine:        Psychiatric    Major depression:         Hematologic    Bleeding problems:    Problems with blood clotting too  easily:        Skin    Rashes or ulcers:        Constitutional    Fever or chills:     PHYSICAL EXAM:   Vitals:   12/28/23 1338  BP: 99/65  Pulse: 75  Resp: 20  Temp: 97.8 F (36.6 C)  SpO2: 98%  Weight: 200 lb (90.7 kg)  Height: 5\' 5"  (1.651 m)    GENERAL: The patient is a well-nourished female, in no acute distress. The vital signs are documented above. CARDIAC: There is a regular rate and rhythm.  VASCULAR: Carotid bifurcation is a little on the high side PULMONARY: Nonlabored respirations ABDOMEN: Soft and non-tender with normal pitched bowel sounds.  MUSCULOSKELETAL: There are no major deformities  or cyanosis. NEUROLOGIC: No focal weakness or paresthesias are detected. SKIN: There are no ulcers or rashes noted. PSYCHIATRIC: The patient has a normal affect.  STUDIES:   I have reviewed the following duplex: Right Carotid: Velocities in the right ICA are consistent with a 1-39%  stenosis.   Left Carotid: Velocities in the left ICA are consistent with a 1-39%  stenosis.   Vertebrals: Bilateral vertebral arteries demonstrate antegrade flow.    ASSESSMENT and PLAN   NYHA a class III: The patient continues to have symptoms of shortness of breath and fatigue despite goal-directed medical therapy.  Her biggest goal is to start cooking again but she does not have the energy for that.  I think she would be an excellent candidate for Barostim therapy.  I will work on Therapist, occupational.   [name], Cherrie Gauze, MD, FACS [facility] [phone] Pager [phone]

## 2023-12-29 ENCOUNTER — Telehealth: Payer: Self-pay

## 2023-12-29 NOTE — Telephone Encounter (Signed)
Attempted to reach pt to schedule her Barostim surgery. Left VM for her to call us back.

## 2023-12-30 ENCOUNTER — Encounter: Payer: Medicare Other | Admitting: Physical Therapy

## 2023-12-30 ENCOUNTER — Other Ambulatory Visit: Payer: Self-pay

## 2023-12-30 DIAGNOSIS — I5022 Chronic systolic (congestive) heart failure: Secondary | ICD-10-CM

## 2024-01-01 ENCOUNTER — Ambulatory Visit: Payer: Medicare Other

## 2024-01-01 ENCOUNTER — Other Ambulatory Visit: Payer: Self-pay | Admitting: Cardiology

## 2024-01-01 DIAGNOSIS — I255 Ischemic cardiomyopathy: Secondary | ICD-10-CM | POA: Diagnosis not present

## 2024-01-04 LAB — CUP PACEART REMOTE DEVICE CHECK
Battery Remaining Longevity: 84 mo
Battery Remaining Percentage: 97 %
Brady Statistic RA Percent Paced: 0 %
Brady Statistic RV Percent Paced: 100 %
Date Time Interrogation Session: 20250131031100
HighPow Impedance: 79 Ohm
Implantable Lead Connection Status: 753985
Implantable Lead Connection Status: 753985
Implantable Lead Connection Status: 753985
Implantable Lead Implant Date: 20220131
Implantable Lead Implant Date: 20220131
Implantable Lead Implant Date: 20220131
Implantable Lead Location: 753858
Implantable Lead Location: 753859
Implantable Lead Location: 753860
Implantable Lead Model: 137
Implantable Lead Model: 3830
Implantable Lead Model: 7840
Implantable Lead Serial Number: 1047234
Implantable Lead Serial Number: 301179
Implantable Pulse Generator Implant Date: 20220131
Lead Channel Impedance Value: 508 Ohm
Lead Channel Impedance Value: 511 Ohm
Lead Channel Impedance Value: 725 Ohm
Lead Channel Setting Pacing Amplitude: 0.1 V
Lead Channel Setting Pacing Amplitude: 3.5 V
Lead Channel Setting Pacing Amplitude: 3.5 V
Lead Channel Setting Pacing Pulse Width: 0.1 ms
Lead Channel Setting Pacing Pulse Width: 0.4 ms
Lead Channel Setting Sensing Sensitivity: 0.5 mV
Lead Channel Setting Sensing Sensitivity: 1 mV
Pulse Gen Serial Number: 149649
Zone Setting Status: 755011

## 2024-01-05 ENCOUNTER — Ambulatory Visit (INDEPENDENT_AMBULATORY_CARE_PROVIDER_SITE_OTHER): Payer: Medicare Other | Admitting: Podiatry

## 2024-01-05 ENCOUNTER — Encounter: Payer: Self-pay | Admitting: Podiatry

## 2024-01-05 DIAGNOSIS — M79675 Pain in left toe(s): Secondary | ICD-10-CM

## 2024-01-05 DIAGNOSIS — M79674 Pain in right toe(s): Secondary | ICD-10-CM

## 2024-01-05 DIAGNOSIS — M7750 Other enthesopathy of unspecified foot: Secondary | ICD-10-CM

## 2024-01-05 DIAGNOSIS — B351 Tinea unguium: Secondary | ICD-10-CM

## 2024-01-05 DIAGNOSIS — E1142 Type 2 diabetes mellitus with diabetic polyneuropathy: Secondary | ICD-10-CM

## 2024-01-05 NOTE — Progress Notes (Signed)
 Chief Complaint  Patient presents with   [facility]    [facility] with last A1c 6.2 in Dec. Also checking the right ankle. It is giving her the blues.  Still swelling when she takes off the compression sock. The sock does help with pain but it comes back. Takes ASA 81, Plavix     HPI: 73 y.o. female presents for follow-up evaluation of right ankle pain and for diabetic foot care.  She states that the steroid shot did not do very much after the first week or two.  She has been using the compressive anklet which has been helpful.  States that she notices more pain without it.  Overall feels like the ankle has improved from initial presentation. Also presenting with painful, thickened, elongated dystrophic toenails that she is unable to maintain herself.  She denies any nausea, vomiting, fever, chills, chest pain, shortness of breath.  Past Medical History:  Diagnosis Date   Anemia 12/12/2019   Arthralgia 01/14/2020   Arthritis    Asthma    Atherosclerotic heart disease of native coronary artery without angina pectoris 04/13/2014   CHF (congestive heart failure) (HCC)    Chronic bilateral low back pain with bilateral sciatica 01/13/2019   Chronic bladder pain 11/08/2014   Last Assessment & Plan:  Formatting of this note might be different from the original. Patient has chronic pain syndrome in general I think this is unfortunately worsening her pelvic pain and bladder pain her examination today was very reassuring I am advised her to use the estrogen cream I did start her on Elavil 10 milligrams at that time if she does tolerated will increase it to 25 milligrams.    Chronic headaches    Chronic idiopathic constipation 12/22/2014   Formatting of this note might be different from the original. Last Assessment & Plan:  For better bowel emptying please use either Citrucel / Benefiber start with 2 tablespoons daily and titrate up or down to effect.  Also please use glycerin  suppositories as needed to  assist in evacuation. Last Assessment & Plan:  Formatting of this note might be different from the original. For better bowel empt   Chronic obstructive pulmonary disease, unspecified (HCC) 03/18/2018   Class 1 obesity due to excess calories with serious comorbidity and body mass index (BMI) of 31.0 to 31.9 in adult 06/26/2020   Colon polyps    COPD (chronic obstructive pulmonary disease) (HCC)    Coronary artery disease    DDD (degenerative disc disease), cervical 01/13/2019   Depression 11/05/2019   Diabetic polyneuropathy associated with type 2 diabetes mellitus (HCC) 02/24/2019   Diverticulitis 05/20/2018   Diverticulitis of colon 03/18/2018   Family history of ischemic heart disease (IHD) 08/16/2013   Fibromyalgia affecting multiple sites 01/14/2020   Gastro-esophageal reflux disease without esophagitis 01/13/2019   Glaucoma 11/08/2014   Hordeolum externum of left upper eyelid 03/13/2020   Hypertension 11/08/2014   IBS (irritable bowel syndrome)    Iron deficiency anemia 01/13/2019   Left renal mass 11/08/2019   Leukocytosis 05/28/2018   Low vitamin B12 level 03/13/2020   Low vitamin D level 12/12/2019   LV dysfunction 05/31/2019   Malignant essential hypertension 01/22/2016   Migraine, unspecified, not intractable, without status migrainosus 11/08/2014   Mixed hyperlipidemia 01/13/2019   Myocardial infarction (HCC)    Other premature beats 11/08/2014   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 Last Assessment & Plan:  For vaginal atrophy please place a pea size dab of Estrogen vaginal cream  into the vagina 3 times  a week ( Monday, Wednesday, Friday) Last Assessment & Plan:  Formatting of this note might be different from the original. For vaginal atrophy please place a pea size dab of Estrogen vaginal cream  into the vagina     Past Surgical History:  Procedure Laterality Date   ABDOMINAL HYSTERECTOMY     BIV ICD INSERTION CRT-D N/A 12/31/2020   Procedure: BIV ICD INSERTION CRT-D;  Surgeon: Waddell Danelle ORN, MD;  Location: [facility] INVASIVE CV LAB;  Service: Cardiovascular;  Laterality: N/A;   BLEPHAROPLASTY Bilateral    COLON SURGERY     CORONARY ANGIOPLASTY WITH STENT PLACEMENT  2020   X3   CORONARY PRESSURE/FFR STUDY N/A 08/03/2020   Procedure: INTRAVASCULAR PRESSURE WIRE/FFR STUDY;  Surgeon: Wonda Sharper, MD;  Location: [facility] INVASIVE CV LAB;  Service: Cardiovascular;  Laterality: N/A;   CORONARY STENT INTERVENTION N/A 08/03/2020   Procedure: CORONARY STENT INTERVENTION;  Surgeon: Wonda Sharper, MD;  Location: [facility] INVASIVE CV LAB;  Service: Cardiovascular;  Laterality: N/A;   LEFT HEART CATH AND CORONARY ANGIOGRAPHY N/A 08/03/2020   Procedure: LEFT HEART CATH AND CORONARY ANGIOGRAPHY;  Surgeon: Wonda Sharper, MD;  Location: [facility] INVASIVE CV LAB;  Service: Cardiovascular;  Laterality: N/A;   RADIOFREQUENCY ABLATION  07/2023   REFRACTIVE SURGERY Left    [place]    RIGHT/LEFT HEART CATH AND CORONARY ANGIOGRAPHY N/A 10/13/2023   Procedure: RIGHT/LEFT HEART CATH AND CORONARY ANGIOGRAPHY;  Surgeon: Rolan Ezra RAMAN, MD;  Location: [facility] INVASIVE CV LAB;  Service: Cardiovascular;  Laterality: N/A;    Allergies  Allergen Reactions   Bee Venom Anaphylaxis   Sumatriptan Shortness Of Breath    Migraine worsened   Amoxicillin-Pot Clavulanate Diarrhea and Nausea And Vomiting    GI Intolerance   Oxycodone Itching and Hives    Other reaction(s): Other (see comments) Funny feeling in head  off the edge    Buprenorphine Hcl     Other reaction(s): Itching   Clarithromycin Diarrhea    Abdominal pain    Duloxetine  Hcl Other (See Comments)    drowsiness   Hydrocodone Itching   Lactose     Upset stomach, gas/bloating    Liraglutide Other (See Comments)    Abdominal discomfort   Tramadol Hcl     Other reaction(s): Itching    ROS negative except as stated in HPI   Physical Exam: There were no vitals filed for this visit.  General: The patient is alert and oriented x3 in no acute distress.  Dermatology: Nails x 10 notable for dystrophic appearance, increased thickness and length with discoloration and subungual debris suggestive of mycotic infection.  Nail plates painful on direct dorsal palpation.  No open wounds or lesions appreciated.  Vascular: DP and PT pulses faintly palpable. Capillary refill approximately 3 seconds to the digits.  Sparse pedal hair growth no erythema or calor.  Neurological: Protective sensation diminished bilaterally via Semmes Weinstein monofilament.  Musculoskeletal Exam: Right ankle some soreness along anterior extensor tendons.  No pain with great toe range of motion.  Radiographic Exam: Right ankle 12/15/2023 Normal osseous mineralization.  Joint spaces  preserved.  No acute fractures or acute osseous abnormalities.  Assessment/Plan of Care: 1. Diabetic polyneuropathy associated with type 2 diabetes mellitus (HCC)   2. Ankle tendinitis   3. Pain due to onychomycosis of toenails of both feet      No orders of the defined types were placed in this encounter.  FOR HOME USE ONLY DME DIABETIC SHOE  Discussed clinical findings with patient today.  # Onychomycosis -Nail plates x 10 were debrided in thickness and length using sterile nail nippers without incident - Mechanical bur used to file down the nails  # Diabetes with neuropathy -Patient educated on diabetes. Discussed proper diabetic foot care and discussed risks and complications of disease. Educated patient in depth on reasons to return to the office immediately should he/she discover anything  concerning or new on the feet. All questions answered. Discussed proper shoes as well.   -Diabetic shoes ordered for patient along with 3 pairs of inserts  # Right ankle tendinitis -Recommend continued use of the ankle compression sleeve and use of good supportive shoes. Discussed tendinitis as an overuse injury and discussed resting. She is deferring cam boot for now.  Can consider physical therapy going forward if she does not notice improvement  Follow-up in 3 months for diabetic footcare or sooner if new pedal complaints arise   Merick Kelleher L. Lamount, DPM, AACFAS [facility]     [address] [place], [place] [address]                Office [phone]  Fax [phone]

## 2024-01-08 NOTE — Pre-Procedure Instructions (Signed)
 Surgical Instructions   Your procedure is scheduled on January 13, 2024. Report to [facility] Main Entrance "A" at 6:30 A.M., then check in with the Admitting office. Any questions or running late day of surgery: call [phone]  Questions prior to your surgery date: call [phone], Monday-Friday, 8am-4pm. If you experience any cold or flu symptoms such as cough, fever, chills, shortness of breath, etc. between now and your scheduled surgery, please notify us  at the above number.     Remember:  Do not eat or drink after midnight the night before your surgery    Take these medicines the morning of surgery with A SIP OF WATER: aspirin   baclofen (LIORESAL)  carvedilol  (COREG )  digoxin  (LANOXIN )  ezetimibe  (ZETIA )  loratadine (CLARITIN)  pregabalin  (LYRICA )  ranolazine  (RANEXA )  valACYclovir (VALTREX)  VERQUVO     May take these medicines IF NEEDED: acetaminophen  (TYLENOL )  albuterol  (PROVENTIL ) nebulizer solution  dicyclomine  (BENTYL )  nitroGLYCERIN  (NITROSTAT ) - if dose taken prior to surgery, please call either of the above phone numbers Ubrogepant    STOP taking your clopidogrel  (PLAVIX ) FIVE DAYS prior to surgery. Your last dose will be February 6th.  STOP taking your dapagliflozin  propanediol (FARXIGA ) three days prior to surgery. Your last dose will be February 8th.   One week prior to surgery, STOP taking any Aleve, Naproxen, Ibuprofen, Motrin, Advil, Goody's, BC's, all herbal medications, fish oil, and non-prescription vitamins.   WHAT DO I DO ABOUT MY DIABETES MEDICATION?   THE NIGHT BEFORE SURGERY, take 17.5 units of TRESIBA FLEXTOUCH insulin .       THE MORNING OF SURGERY, take 17.5 units of TRESIBA FLEXTOUCH insulin .   STOP taking your OZEMPIC one week prior to surgery. DO NOT take any doses after February 4th.   If your CBG is greater than 220 mg/dL, you may take  of your insulin  lispro (HUMALOG).   HOW TO MANAGE YOUR DIABETES BEFORE AND  AFTER SURGERY  Why is it important to control my blood sugar before and after surgery? Improving blood sugar levels before and after surgery helps healing and can limit problems. A way of improving blood sugar control is eating a healthy diet by:  Eating less sugar and carbohydrates  Increasing activity/exercise  Talking with your doctor about reaching your blood sugar goals High blood sugars (greater than 180 mg/dL) can raise your risk of infections and slow your recovery, so you will need to focus on controlling your diabetes during the weeks before surgery. Make sure that the doctor who takes care of your diabetes knows about your planned surgery including the date and location.  How do I manage my blood sugar before surgery? Check your blood sugar at least 4 times a day, starting 2 days before surgery, to make sure that the level is not too high or low.  Check your blood sugar the morning of your surgery when you wake up and every 2 hours until you get to the Short Stay unit.  If your blood sugar is less than 70 mg/dL, you will need to treat for low blood sugar: Do not take insulin . Treat a low blood sugar (less than 70 mg/dL) with  cup of clear juice (cranberry or apple), 4 glucose tablets, OR glucose gel. Recheck blood sugar in 15 minutes after treatment (to make sure it is greater than 70 mg/dL). If your blood sugar is not greater than 70 mg/dL on recheck, call [phone] for further instructions. Report your blood sugar to the short stay nurse when  you get to Short Stay.  If you are admitted to the hospital after surgery: Your blood sugar will be checked by the staff and you will probably be given insulin  after surgery (instead of oral diabetes medicines) to make sure you have good blood sugar levels. The goal for blood sugar control after surgery is 80-180 mg/dL.                      Do NOT Smoke (Tobacco/Vaping) for 24 hours prior to your procedure.  If you use a CPAP at  night, you may bring your mask/headgear for your overnight stay.   You will be asked to remove any contacts, glasses, piercing's, hearing aid's, dentures/partials prior to surgery. Please bring cases for these items if needed.    Patients discharged the day of surgery will not be allowed to drive home, and someone needs to stay with them for 24 hours.  SURGICAL WAITING ROOM VISITATION Patients may have no more than 2 support people in the waiting area - these visitors may rotate.   Pre-op nurse will coordinate an appropriate time for 1 ADULT support person, who may not rotate, to accompany patient in pre-op.  Children under the age of 30 must have an adult with them who is not the patient and must remain in the main waiting area with an adult.  If the patient needs to stay at the hospital during part of their recovery, the visitor guidelines for inpatient rooms apply.  Please refer to the [facility] website for the visitor guidelines for any additional information.   If you received a COVID test during your pre-op visit  it is requested that you wear a mask when out in public, stay away from anyone that may not be feeling well and notify your surgeon if you develop symptoms. If you have been in contact with anyone that has tested positive in the last 10 days please notify you surgeon.      Pre-operative CHG Bathing Instructions   You can play a key role in reducing the risk of infection after surgery. Your skin needs to be as free of germs as possible. You can reduce the number of germs on your skin by washing with CHG (chlorhexidine  gluconate) soap before surgery. CHG is an antiseptic soap that kills germs and continues to kill germs even after washing.   DO NOT use if you have an allergy to chlorhexidine /CHG or antibacterial soaps. If your skin becomes reddened or irritated, stop using the CHG and notify one of our RNs at [phone].              TAKE A SHOWER THE NIGHT BEFORE SURGERY  AND THE DAY OF SURGERY    Please keep in mind the following:  DO NOT shave, including legs and underarms, 48 hours prior to surgery.   You may shave your face before/day of surgery.  Place clean sheets on your bed the night before surgery Use a clean washcloth (not used since being washed) for each shower. DO NOT sleep with pet's night before surgery.  CHG Shower Instructions:  Wash your face and private area with normal soap. If you choose to wash your hair, wash first with your normal shampoo.  After you use shampoo/soap, rinse your hair and body thoroughly to remove shampoo/soap residue.  Turn the water OFF and apply half the bottle of CHG soap to a CLEAN washcloth.  Apply CHG soap ONLY FROM YOUR NECK DOWN TO  YOUR TOES (washing for 3-5 minutes)  DO NOT use CHG soap on face, private areas, open wounds, or sores.  Pay special attention to the area where your surgery is being performed.  If you are having back surgery, having someone wash your back for you may be helpful. Wait 2 minutes after CHG soap is applied, then you may rinse off the CHG soap.  Pat dry with a clean towel  Put on clean pajamas    Additional instructions for the day of surgery: DO NOT APPLY any lotions, deodorants, cologne, or perfumes.   Do not wear jewelry or makeup Do not wear nail polish, gel polish, artificial nails, or any other type of covering on natural nails (fingers and toes) Do not bring valuables to the hospital. [facility] is not responsible for valuables/personal belongings. Put on clean/comfortable clothes.  Please brush your teeth.  Ask your nurse before applying any prescription medications to the skin.

## 2024-01-11 ENCOUNTER — Telehealth: Payer: Self-pay

## 2024-01-11 ENCOUNTER — Encounter (HOSPITAL_COMMUNITY)
Admission: RE | Admit: 2024-01-11 | Discharge: 2024-01-11 | Disposition: A | Discharge status: Home / Self Care | Payer: Medicare Other | Source: Ambulatory Visit | Attending: Surgery

## 2024-01-11 ENCOUNTER — Other Ambulatory Visit: Payer: Self-pay

## 2024-01-11 ENCOUNTER — Encounter (HOSPITAL_COMMUNITY): Payer: Self-pay

## 2024-01-11 VITALS — BP 99/57 | HR 76 | Temp 97.6°F | Resp 17 | Ht 65.0 in | Wt 202.0 lb

## 2024-01-11 DIAGNOSIS — Z01812 Encounter for preprocedural laboratory examination: Secondary | ICD-10-CM | POA: Diagnosis present

## 2024-01-11 DIAGNOSIS — E119 Type 2 diabetes mellitus without complications: Secondary | ICD-10-CM | POA: Insufficient documentation

## 2024-01-11 DIAGNOSIS — Z794 Long term (current) use of insulin: Secondary | ICD-10-CM | POA: Insufficient documentation

## 2024-01-11 DIAGNOSIS — Z01818 Encounter for other preprocedural examination: Secondary | ICD-10-CM

## 2024-01-11 DIAGNOSIS — I5022 Chronic systolic (congestive) heart failure: Secondary | ICD-10-CM | POA: Insufficient documentation

## 2024-01-11 HISTORY — DX: Angina pectoris, unspecified: I20.9

## 2024-01-11 HISTORY — DX: Presence of automatic (implantable) cardiac defibrillator: Z95.810

## 2024-01-11 HISTORY — DX: Chronic kidney disease, stage 3 unspecified: N18.30

## 2024-01-11 HISTORY — DX: Nontoxic single thyroid nodule: E04.1

## 2024-01-11 HISTORY — DX: Fibromyalgia: M79.7

## 2024-01-11 LAB — URINALYSIS, ROUTINE W REFLEX MICROSCOPIC
Bacteria, UA: NONE SEEN
Bilirubin Urine: NEGATIVE
Glucose, UA: >=500 mg/dL — AB
Hgb urine dipstick: NEGATIVE
Ketones, ur: NEGATIVE mg/dL
Leukocytes,Ua: NEGATIVE
Nitrite: NEGATIVE
Protein, ur: NEGATIVE mg/dL
Specific Gravity, Urine: 1.01 (ref 1.005–1.030)
pH: 6 (ref 5.0–8.0)

## 2024-01-11 LAB — COMPREHENSIVE METABOLIC PANEL
ALT: 16 U/L (ref 0–44)
AST: 19 U/L (ref 15–41)
Albumin: 3.4 g/dL — ABNORMAL LOW (ref 3.5–5.0)
Alkaline Phosphatase: 59 U/L (ref 38–126)
Anion gap: 11 (ref 5–15)
BUN: 12 mg/dL (ref 8–23)
CO2: 26 mmol/L (ref 22–32)
Calcium: 9.5 mg/dL (ref 8.9–10.3)
Chloride: 107 mmol/L (ref 98–111)
Creatinine, Ser: 1.3 mg/dL — ABNORMAL HIGH (ref 0.44–1.00)
GFR, Estimated: 43 mL/min — ABNORMAL LOW (ref >60)
Glucose, Bld: 115 mg/dL — ABNORMAL HIGH (ref 70–99)
Potassium: 3.5 mmol/L (ref 3.5–5.1)
Sodium: 144 mmol/L (ref 135–145)
Total Bilirubin: 0.5 mg/dL (ref 0.0–1.2)
Total Protein: 6.9 g/dL (ref 6.5–8.1)

## 2024-01-11 LAB — TYPE AND SCREEN
ABO/RH(D): A POS
Antibody Screen: NEGATIVE

## 2024-01-11 LAB — CBC
HCT: 40.4 % (ref 36.0–46.0)
Hemoglobin: 12.8 g/dL (ref 12.0–15.0)
MCH: 29.7 pg (ref 26.0–34.0)
MCHC: 31.7 g/dL (ref 30.0–36.0)
MCV: 93.7 fL (ref 80.0–100.0)
Platelets: 274 10*3/uL (ref 150–400)
RBC: 4.31 MIL/uL (ref 3.87–5.11)
RDW: 15 % (ref 11.5–15.5)
WBC: 8.2 10*3/uL (ref 4.0–10.5)
nRBC: 0 % (ref 0.0–0.2)

## 2024-01-11 LAB — PROTIME-INR
INR: 1.1 (ref 0.8–1.2)
Prothrombin Time: 14.4 s (ref 11.4–15.2)

## 2024-01-11 LAB — SURGICAL PCR SCREEN
MRSA, PCR: NEGATIVE
Staphylococcus aureus: NEGATIVE

## 2024-01-11 LAB — HEMOGLOBIN A1C
Hgb A1c MFr Bld: 7.7 % — ABNORMAL HIGH (ref 4.8–5.6)
Mean Plasma Glucose: 174.29 mg/dL

## 2024-01-11 LAB — APTT: aPTT: 29 s (ref 24–36)

## 2024-01-11 LAB — GLUCOSE, CAPILLARY: Glucose-Capillary: 262 mg/dL — ABNORMAL HIGH (ref 70–99)

## 2024-01-11 NOTE — Telephone Encounter (Signed)
 Spoke with Karen Warner with [employer] to let him know Barostim is scheduled for this week. He confirmed that will be there that morning.

## 2024-01-11 NOTE — Progress Notes (Addendum)
 PCP - Dr. Lucrezia Sachs Cardiologist - Dr. Jerryl Morin Nephrologist- Dr. Evone Hoh Endocrinologist- Lum Salina, PA-C  ICD - [facility] Device Orders - No, pt needs appt (overdue with device clinic). Pt was able to get appt at 0825 on 2/11 w/ Dr. Carolynne Citron. Rep Notified - Left message for Joey with [facility] [employer]  Chest x-ray - 02/18/23 EKG - 11/11/23 Stress Test - 01/29/23 ECHO - 07/10/23 Cardiac Cath - 10/13/23  Sleep Study - OSA+ CPAP - denies  Fasting Blood Sugar - 175-250 Checks Blood Sugar multiple times a day (continous monitor)  Last dose of GLP1 agonist-  2/3 GLP1 instructions: Hold 7 days. Pt knows not to take any further doses prior to surgery  ASA/Blood Thinner Instructions: Pt states she was told to hold ASA and Plavix  5 days prior to surgery. Last doses 2/6.   ERAS Protcol - no, NPO   COVID TEST- n/a   Anesthesia review: yes, cardiac, nephrology and endocrinology history.  CBG was 262.   Patient denies shortness of breath, fever, cough and chest pain at PAT appointment   All instructions explained to the patient, with a verbal understanding of the material. Patient agrees to go over the instructions while at home for a better understanding.  The opportunity to ask questions was provided.

## 2024-01-12 ENCOUNTER — Encounter: Payer: Self-pay | Admitting: Pulmonary Disease

## 2024-01-12 ENCOUNTER — Telehealth: Payer: Self-pay

## 2024-01-12 ENCOUNTER — Ambulatory Visit: Payer: Medicare Other | Attending: Pulmonary Disease | Admitting: Pulmonary Disease

## 2024-01-12 ENCOUNTER — Encounter: Payer: Self-pay | Admitting: Internal Medicine

## 2024-01-12 VITALS — BP 110/64 | HR 74 | Ht 65.0 in | Wt 202.0 lb

## 2024-01-12 DIAGNOSIS — Z95810 Presence of automatic (implantable) cardiac defibrillator: Secondary | ICD-10-CM | POA: Diagnosis not present

## 2024-01-12 DIAGNOSIS — I5022 Chronic systolic (congestive) heart failure: Secondary | ICD-10-CM | POA: Insufficient documentation

## 2024-01-12 DIAGNOSIS — I255 Ischemic cardiomyopathy: Secondary | ICD-10-CM | POA: Insufficient documentation

## 2024-01-12 LAB — CUP PACEART INCLINIC DEVICE CHECK
Date Time Interrogation Session: 20250211164653
HighPow Impedance: 74 Ohm
Implantable Lead Connection Status: 753985
Implantable Lead Connection Status: 753985
Implantable Lead Connection Status: 753985
Implantable Lead Implant Date: 20220131
Implantable Lead Implant Date: 20220131
Implantable Lead Implant Date: 20220131
Implantable Lead Location: 753858
Implantable Lead Location: 753859
Implantable Lead Location: 753860
Implantable Lead Model: 137
Implantable Lead Model: 3830
Implantable Lead Model: 7840
Implantable Lead Serial Number: 1047234
Implantable Lead Serial Number: 301179
Implantable Pulse Generator Implant Date: 20220131
Lead Channel Impedance Value: 518 Ohm
Lead Channel Impedance Value: 519 Ohm
Lead Channel Impedance Value: 700 Ohm
Lead Channel Pacing Threshold Amplitude: 0.7 V
Lead Channel Pacing Threshold Amplitude: 0.8 V
Lead Channel Pacing Threshold Amplitude: 0.8 V
Lead Channel Pacing Threshold Pulse Width: 0.4 ms
Lead Channel Pacing Threshold Pulse Width: 0.4 ms
Lead Channel Pacing Threshold Pulse Width: 0.4 ms
Lead Channel Sensing Intrinsic Amplitude: 1 mV
Lead Channel Sensing Intrinsic Amplitude: 21.9 mV
Lead Channel Sensing Intrinsic Amplitude: 4.3 mV
Lead Channel Setting Pacing Amplitude: 0.1 V
Lead Channel Setting Pacing Amplitude: 3.5 V
Lead Channel Setting Pacing Amplitude: 3.5 V
Lead Channel Setting Pacing Pulse Width: 0.1 ms
Lead Channel Setting Pacing Pulse Width: 0.4 ms
Lead Channel Setting Sensing Sensitivity: 0.5 mV
Lead Channel Setting Sensing Sensitivity: 1 mV
Pulse Gen Serial Number: 149649
Zone Setting Status: 755011

## 2024-01-12 NOTE — Telephone Encounter (Signed)
Attempted to return patient call re: surgery.  No answer/ No VM

## 2024-01-12 NOTE — Patient Instructions (Signed)
Medication Instructions:  Your physician recommends that you continue on your current medications as directed. Please refer to the Current Medication list given to you today.  *If you need a refill on your cardiac medications before your next appointment, please call your pharmacy*  Lab Work: None ordered If you have labs (blood work) drawn today and your tests are completely normal, you will receive your results only by: MyChart Message (if you have MyChart) OR A paper copy in the mail If you have any lab test that is abnormal or we need to change your treatment, we will call you to review the results.  Follow-Up: At [facility], you and your health needs are our priority.  As part of our continuing mission to provide you with exceptional heart care, we have created designated Provider Care Teams.  These Care Teams include your primary Cardiologist (physician) and Advanced Practice Providers (APPs -  Physician Assistants and Nurse Practitioners) who all work together to provide you with the care you need, when you need it.  Your next appointment:   6 month(s)  Provider:   Casimiro Needle "Otilio Saber, PA-C

## 2024-01-12 NOTE — Progress Notes (Signed)
PERIOPERATIVE PRESCRIPTION FOR IMPLANTED CARDIAC DEVICE PROGRAMMING  Patient Information: Name:  Karen Warner  DOB:  August 07, 1951  MRN:  [id number]    Planned Procedure:  Right Barostim  Surgeon:  Dr. Coral Else  Date of Procedure:  01/13/2024  Cautery will be used.  Position during surgery:  Supine   Please send documentation back to:  [facility] (Fax # [phone])   Device Information:  Clinic EP Physician:  Lewayne Bunting, MD   Device Type:  Defibrillator Manufacturer and Phone #:  [employer]: [phone] Pacemaker Dependent?:  No. Date of Last Device Check:  In clinic: 01/12/24 Normal Device Function?:  Yes.    Electrophysiologist's Recommendations:  Have magnet available. Provide continuous ECG monitoring when magnet is used or reprogramming is to be performed.  Procedure will likely interfere with device function.  Device should be programmed:  Tachy therapies disabled (industry rep to be present for procedure) Joey Deakins BS[employer] industry rep.   Per Device Clinic Standing Orders, Kizzie Ide, RN  9:14 PM 01/12/2024

## 2024-01-12 NOTE — Progress Notes (Signed)
Electrophysiology Office Note:   Date:  01/12/2024  ID:  [name], DOB 12-16-1950, MRN [id number]  Primary Cardiologist: Thomasene Ripple, DO Primary Heart Failure: None Electrophysiologist: Lewayne Bunting, MD       History of Present Illness:   Karen Warner is a 73 y.o. female with h/o ICM/HFrEF s/p BiV ICD, CAD s/p PCI, HTN, HLD, COPD, tobacco abuse, OSA not on CPAP, DM II, GERD seen today for routine electrophysiology followup.   She is being followed by Dr. Myra Gianotti and Dr. Shirlee Latch and is in the process of evaluation for Barostim implant.   Since last being seen in our clinic the patient reports doing well. She has chronic issues with fatigue and is hopeful her procedure tomorrow will improve her energy levels.  She is pending Barostim implant.  She denies chest pain, palpitations, dyspnea, PND, orthopnea, nausea, vomiting, dizziness, syncope, edema, weight gain, or early satiety.   Review of systems complete and found to be negative unless listed in HPI.   EP Information / Studies Reviewed:    EKG is ordered today. Personal review as below.  EKG Interpretation Date/Time:  Tuesday January 12 2024 08:48:55 EST Ventricular Rate:  74 PR Interval:  138 QRS Duration:  138 QT Interval:  412 QTC Calculation: 457 R Axis:   -28  Text Interpretation: Atrial-sensed ventricular-paced rhythm Confirmed by Canary Brim ([id number]) on 01/12/2024 9:22:45 AM   ICD Interrogation-  reviewed in detail today,  See PACEART report.  Device History: [employer] BiV ICD implanted 12/31/2020 for LBBB, HFrEF/ICM History of appropriate therapy: No History of AAD therapy: No       Physical Exam:   VS:  BP 110/64   Pulse 74   Ht 5\' 5"  (1.651 m)   Wt 202 lb (91.6 kg)   SpO2 97%   BMI 33.61 kg/m    Wt Readings from Last 3 Encounters:  01/12/24 202 lb (91.6 kg)  01/11/24 202 lb (91.6 kg)  12/28/23 200 lb (90.7 kg)     GEN: Well nourished, well developed in no acute  distress NECK: No JVD; No carotid bruits CARDIAC: Regular rate and rhythm, no murmurs, rubs, gallops RESPIRATORY:  Clear to auscultation without rales, wheezing or rhonchi  ABDOMEN: Soft, non-tender, non-distended EXTREMITIES:  No edema; No deformity   ASSESSMENT AND PLAN:    Chronic Systolic Dysfunction s/p [employer] CRT-D  ICM  NYHA III symptoms  -pending Barostim  -euvolemic today -Stable on an appropriate medical regimen -Normal ICD function -See Pace Art report -No changes today  PreOperative Clearance  Procedure: Barostim  Date of Surgery: 01/12/23     Ms. Evans Walton's perioperative risk of a major cardiac event is 11% according to the Revised Cardiac Risk Index (RCRI).  Therefore, she is at high risk for perioperative complications.   Her functional capacity is fair at 5.72 METs according to the Duke Activity Status Index (DASI).  Recommendations: According to ACC/AHA guidelines, no further cardiovascular testing needed.  The patient may proceed to surgery at acceptable risk.    Antiplatelet and/or Anticoagulation Recommendations: Aspirin can be held for 5 days prior to her surgery.  Please resume Aspirin post operatively when it is felt to be safe from a bleeding standpoint.  Clopidogrel (Plavix) can be held for 5 days prior to her surgery and resumed as soon as possible post op.  The patient is not on OAC. Confirmed she has not been taking her Plavix/ASA since 2/6   Disposition:  Follow up with EP APP in 6 months   Signed, Canary Brim, NP-C, AGACNP-BC [facility] - Electrophysiology  01/12/2024, 4:50 PM

## 2024-01-13 ENCOUNTER — Other Ambulatory Visit: Payer: Self-pay

## 2024-01-13 ENCOUNTER — Ambulatory Visit (HOSPITAL_COMMUNITY)
Admission: RE | Admit: 2024-01-13 | Discharge: 2024-01-13 | Disposition: A | Discharge status: Home / Self Care | Payer: Medicare Other | Attending: Surgery | Admitting: Surgery

## 2024-01-13 ENCOUNTER — Ambulatory Visit (HOSPITAL_COMMUNITY): Payer: Self-pay | Admitting: Medical

## 2024-01-13 ENCOUNTER — Encounter (HOSPITAL_COMMUNITY): Payer: Self-pay | Admitting: Surgery

## 2024-01-13 ENCOUNTER — Encounter (HOSPITAL_COMMUNITY)
Admission: RE | Disposition: A | Discharge status: Home / Self Care | Payer: Self-pay | Source: Home / Self Care | Attending: Surgery

## 2024-01-13 ENCOUNTER — Ambulatory Visit (HOSPITAL_BASED_OUTPATIENT_CLINIC_OR_DEPARTMENT_OTHER): Payer: Medicare Other | Admitting: Certified Registered Nurse Anesthetist

## 2024-01-13 DIAGNOSIS — K219 Gastro-esophageal reflux disease without esophagitis: Secondary | ICD-10-CM | POA: Diagnosis not present

## 2024-01-13 DIAGNOSIS — E11319 Type 2 diabetes mellitus with unspecified diabetic retinopathy without macular edema: Secondary | ICD-10-CM | POA: Diagnosis not present

## 2024-01-13 DIAGNOSIS — Z7984 Long term (current) use of oral hypoglycemic drugs: Secondary | ICD-10-CM | POA: Diagnosis not present

## 2024-01-13 DIAGNOSIS — Z833 Family history of diabetes mellitus: Secondary | ICD-10-CM | POA: Insufficient documentation

## 2024-01-13 DIAGNOSIS — G894 Chronic pain syndrome: Secondary | ICD-10-CM | POA: Diagnosis not present

## 2024-01-13 DIAGNOSIS — E7800 Pure hypercholesterolemia, unspecified: Secondary | ICD-10-CM | POA: Diagnosis not present

## 2024-01-13 DIAGNOSIS — J449 Chronic obstructive pulmonary disease, unspecified: Secondary | ICD-10-CM | POA: Diagnosis not present

## 2024-01-13 DIAGNOSIS — M1990 Unspecified osteoarthritis, unspecified site: Secondary | ICD-10-CM | POA: Diagnosis not present

## 2024-01-13 DIAGNOSIS — Z7982 Long term (current) use of aspirin: Secondary | ICD-10-CM | POA: Diagnosis not present

## 2024-01-13 DIAGNOSIS — G4730 Sleep apnea, unspecified: Secondary | ICD-10-CM | POA: Diagnosis not present

## 2024-01-13 DIAGNOSIS — Z79624 Long term (current) use of inhibitors of nucleotide synthesis: Secondary | ICD-10-CM | POA: Diagnosis not present

## 2024-01-13 DIAGNOSIS — I252 Old myocardial infarction: Secondary | ICD-10-CM | POA: Diagnosis not present

## 2024-01-13 DIAGNOSIS — I132 Hypertensive heart and chronic kidney disease with heart failure and with stage 5 chronic kidney disease, or end stage renal disease: Secondary | ICD-10-CM | POA: Diagnosis not present

## 2024-01-13 DIAGNOSIS — I255 Ischemic cardiomyopathy: Secondary | ICD-10-CM | POA: Insufficient documentation

## 2024-01-13 DIAGNOSIS — D649 Anemia, unspecified: Secondary | ICD-10-CM | POA: Insufficient documentation

## 2024-01-13 DIAGNOSIS — M797 Fibromyalgia: Secondary | ICD-10-CM | POA: Insufficient documentation

## 2024-01-13 DIAGNOSIS — I110 Hypertensive heart disease with heart failure: Secondary | ICD-10-CM | POA: Diagnosis not present

## 2024-01-13 DIAGNOSIS — E119 Type 2 diabetes mellitus without complications: Secondary | ICD-10-CM

## 2024-01-13 DIAGNOSIS — E1142 Type 2 diabetes mellitus with diabetic polyneuropathy: Secondary | ICD-10-CM | POA: Insufficient documentation

## 2024-01-13 DIAGNOSIS — I5022 Chronic systolic (congestive) heart failure: Secondary | ICD-10-CM | POA: Insufficient documentation

## 2024-01-13 DIAGNOSIS — Z955 Presence of coronary angioplasty implant and graft: Secondary | ICD-10-CM | POA: Insufficient documentation

## 2024-01-13 DIAGNOSIS — E1151 Type 2 diabetes mellitus with diabetic peripheral angiopathy without gangrene: Secondary | ICD-10-CM | POA: Diagnosis not present

## 2024-01-13 DIAGNOSIS — E1122 Type 2 diabetes mellitus with diabetic chronic kidney disease: Secondary | ICD-10-CM | POA: Diagnosis not present

## 2024-01-13 DIAGNOSIS — Z79899 Other long term (current) drug therapy: Secondary | ICD-10-CM | POA: Insufficient documentation

## 2024-01-13 DIAGNOSIS — I2510 Atherosclerotic heart disease of native coronary artery without angina pectoris: Secondary | ICD-10-CM | POA: Diagnosis not present

## 2024-01-13 DIAGNOSIS — N186 End stage renal disease: Secondary | ICD-10-CM | POA: Diagnosis not present

## 2024-01-13 DIAGNOSIS — R519 Headache, unspecified: Secondary | ICD-10-CM | POA: Diagnosis not present

## 2024-01-13 DIAGNOSIS — Z95810 Presence of automatic (implantable) cardiac defibrillator: Secondary | ICD-10-CM | POA: Insufficient documentation

## 2024-01-13 DIAGNOSIS — Z7902 Long term (current) use of antithrombotics/antiplatelets: Secondary | ICD-10-CM | POA: Insufficient documentation

## 2024-01-13 DIAGNOSIS — Z87891 Personal history of nicotine dependence: Secondary | ICD-10-CM | POA: Insufficient documentation

## 2024-01-13 DIAGNOSIS — Z794 Long term (current) use of insulin: Secondary | ICD-10-CM | POA: Diagnosis not present

## 2024-01-13 LAB — ABO/RH: ABO/RH(D): A POS

## 2024-01-13 LAB — GLUCOSE, CAPILLARY
Glucose-Capillary: 183 mg/dL — ABNORMAL HIGH (ref 70–99)
Glucose-Capillary: 168 mg/dL — ABNORMAL HIGH (ref 70–99)

## 2024-01-13 SURGERY — INSERTION, CAROTID SINUS BAROREFLEX ACTIVATION DEVICE
Anesthesia: General | Site: Chest | Laterality: Right

## 2024-01-13 MED ORDER — SODIUM CHLORIDE 0.9 % IV SOLN: INTRAVENOUS | Status: DC | PRN

## 2024-01-13 MED ORDER — SODIUM CHLORIDE 0.9 % IV SOLN
0.1500 ug/kg/min | INTRAVENOUS | Status: AC
  Administered 2024-01-13: .2 ug/kg/min via INTRAVENOUS
  Filled 2024-01-13: qty 2000

## 2024-01-13 MED ORDER — AMISULPRIDE (ANTIEMETIC) 5 MG/2ML IV SOLN
10.0000 mg | Freq: Once | INTRAVENOUS | Status: AC | PRN
  Administered 2024-01-13: 10 mg via INTRAVENOUS

## 2024-01-13 MED ORDER — LIDOCAINE HCL (PF) 1 % IJ SOLN
INTRAMUSCULAR | Status: AC
  Filled 2024-01-13: qty 30

## 2024-01-13 MED ORDER — CHLORHEXIDINE GLUCONATE CLOTH 2 % EX PADS: 6.0000 | MEDICATED_PAD | Freq: Once | CUTANEOUS | Status: DC

## 2024-01-13 MED ORDER — DEXAMETHASONE SODIUM PHOSPHATE 10 MG/ML IJ SOLN
INTRAMUSCULAR | Status: DC | PRN
  Administered 2024-01-13: 4 mg via INTRAVENOUS

## 2024-01-13 MED ORDER — SUGAMMADEX SODIUM 200 MG/2ML IV SOLN
INTRAVENOUS | Status: DC | PRN
  Administered 2024-01-13: 200 mg via INTRAVENOUS

## 2024-01-13 MED ORDER — SODIUM CHLORIDE 0.9 % IV SOLN: INTRAVENOUS | Status: DC

## 2024-01-13 MED ORDER — ROCURONIUM BROMIDE 10 MG/ML (PF) SYRINGE
PREFILLED_SYRINGE | INTRAVENOUS | Status: AC
  Filled 2024-01-13: qty 10

## 2024-01-13 MED ORDER — EPINEPHRINE 1 MG/10ML IJ SOSY
PREFILLED_SYRINGE | INTRAMUSCULAR | Status: AC
  Filled 2024-01-13: qty 10

## 2024-01-13 MED ORDER — LACTATED RINGERS IV SOLN: INTRAVENOUS | Status: DC

## 2024-01-13 MED ORDER — LIDOCAINE 2% (20 MG/ML) 5 ML SYRINGE
INTRAMUSCULAR | Status: DC | PRN
  Administered 2024-01-13: 40 mg via INTRAVENOUS

## 2024-01-13 MED ORDER — SODIUM CHLORIDE (PF) 0.9 % IJ SOLN
INTRAMUSCULAR | Status: AC
  Filled 2024-01-13: qty 10

## 2024-01-13 MED ORDER — LIDOCAINE 2% (20 MG/ML) 5 ML SYRINGE
INTRAMUSCULAR | Status: AC
  Filled 2024-01-13: qty 5

## 2024-01-13 MED ORDER — ONDANSETRON HCL 4 MG/2ML IJ SOLN
INTRAMUSCULAR | Status: DC | PRN
  Administered 2024-01-13: 4 mg via INTRAVENOUS

## 2024-01-13 MED ORDER — 0.9 % SODIUM CHLORIDE (POUR BTL) OPTIME
TOPICAL | Status: DC | PRN
  Administered 2024-01-13: 1000 mL

## 2024-01-13 MED ORDER — FENTANYL CITRATE (PF) 100 MCG/2ML IJ SOLN: 25.0000 ug | INTRAMUSCULAR | Status: DC | PRN

## 2024-01-13 MED ORDER — PHENYLEPHRINE HCL-NACL 20-0.9 MG/250ML-% IV SOLN
INTRAVENOUS | Status: DC | PRN
  Administered 2024-01-13: 40 ug/min via INTRAVENOUS

## 2024-01-13 MED ORDER — ACETAMINOPHEN 500 MG PO TABS
1000.0000 mg | ORAL_TABLET | Freq: Once | ORAL | Status: AC
  Administered 2024-01-13: 1000 mg via ORAL
  Filled 2024-01-13: qty 2

## 2024-01-13 MED ORDER — FENTANYL CITRATE (PF) 250 MCG/5ML IJ SOLN
INTRAMUSCULAR | Status: DC | PRN
  Administered 2024-01-13: 50 ug via INTRAVENOUS

## 2024-01-13 MED ORDER — FENTANYL CITRATE (PF) 250 MCG/5ML IJ SOLN
INTRAMUSCULAR | Status: AC
  Filled 2024-01-13: qty 5

## 2024-01-13 MED ORDER — ETOMIDATE 2 MG/ML IV SOLN
INTRAVENOUS | Status: DC | PRN
  Administered 2024-01-13: 14 mg via INTRAVENOUS

## 2024-01-13 MED ORDER — PHENYLEPHRINE 80 MCG/ML (10ML) SYRINGE FOR IV PUSH (FOR BLOOD PRESSURE SUPPORT)
PREFILLED_SYRINGE | INTRAVENOUS | Status: DC | PRN
  Administered 2024-01-13 (×4): 40 ug via INTRAVENOUS

## 2024-01-13 MED ORDER — ORAL CARE MOUTH RINSE: 15.0000 mL | Freq: Once | OROMUCOSAL | Status: AC

## 2024-01-13 MED ORDER — DEXAMETHASONE SODIUM PHOSPHATE 10 MG/ML IJ SOLN
INTRAMUSCULAR | Status: AC
Start: 2024-01-13 — End: ?
  Filled 2024-01-13: qty 1

## 2024-01-13 MED ORDER — INSULIN ASPART 100 UNIT/ML IJ SOLN
0.0000 [IU] | INTRAMUSCULAR | Status: DC | PRN
  Administered 2024-01-13: 2 [IU] via SUBCUTANEOUS
  Filled 2024-01-13: qty 1

## 2024-01-13 MED ORDER — VANCOMYCIN HCL IN DEXTROSE 1-5 GM/200ML-% IV SOLN
1000.0000 mg | INTRAVENOUS | Status: AC
  Administered 2024-01-13: 1000 mg via INTRAVENOUS
  Filled 2024-01-13: qty 200

## 2024-01-13 MED ORDER — CHLORHEXIDINE GLUCONATE 0.12 % MT SOLN
15.0000 mL | Freq: Once | OROMUCOSAL | Status: AC
  Administered 2024-01-13: 15 mL via OROMUCOSAL
  Filled 2024-01-13: qty 15

## 2024-01-13 MED ORDER — AMISULPRIDE (ANTIEMETIC) 5 MG/2ML IV SOLN
INTRAVENOUS | Status: AC
  Filled 2024-01-13: qty 4

## 2024-01-13 MED ORDER — ROCURONIUM BROMIDE 10 MG/ML (PF) SYRINGE
PREFILLED_SYRINGE | INTRAVENOUS | Status: DC | PRN
  Administered 2024-01-13: 50 mg via INTRAVENOUS
  Administered 2024-01-13: 10 mg via INTRAVENOUS

## 2024-01-13 SURGICAL SUPPLY — 35 items
BAG COUNTER SPONGE SURGICOUNT (BAG) ×1 IMPLANT
CANISTER SUCT 3000ML PPV (MISCELLANEOUS) ×1 IMPLANT
CHLORAPREP W/TINT 26 (MISCELLANEOUS) ×1 IMPLANT
CLIP TI MEDIUM 6 (CLIP) IMPLANT
CLIP TI WIDE RED SMALL 6 (CLIP) IMPLANT
COVER PROBE W GEL 5X96 (DRAPES) ×1 IMPLANT
DERMABOND ADVANCED .7 DNX12 (GAUZE/BANDAGES/DRESSINGS) ×1 IMPLANT
ELECT REM PT RETURN 9FT ADLT (ELECTROSURGICAL) ×1 IMPLANT
ELECTRODE REM PT RTRN 9FT ADLT (ELECTROSURGICAL) ×1 IMPLANT
GLOVE BIO SURGEON STRL SZ7 (GLOVE) IMPLANT
GLOVE BIOGEL PI IND STRL 7.5 (GLOVE) IMPLANT
GLOVE SURG SS PI 7.5 STRL IVOR (GLOVE) ×1 IMPLANT
GOWN STRL REUS W/ TWL LRG LVL3 (GOWN DISPOSABLE) ×2 IMPLANT
GOWN STRL REUS W/ TWL XL LVL3 (GOWN DISPOSABLE) ×1 IMPLANT
HEMOSTAT SNOW SURGICEL 2X4 (HEMOSTASIS) IMPLANT
KIT BASIN OR (CUSTOM PROCEDURE TRAY) ×1 IMPLANT
KIT TURNOVER KIT B (KITS) ×1 IMPLANT
MARKER SKIN DUAL TIP RULER LAB (MISCELLANEOUS) ×1 IMPLANT
NDL 18GX1X1/2 (RX/OR ONLY) (NEEDLE) ×1 IMPLANT
NEEDLE 18GX1X1/2 (RX/OR ONLY) (NEEDLE) ×1 IMPLANT
NS IRRIG 1000ML POUR BTL (IV SOLUTION) ×2 IMPLANT
PACK CAROTID (CUSTOM PROCEDURE TRAY) ×1 IMPLANT
PAD ARMBOARD 7.5X6 YLW CONV (MISCELLANEOUS) ×2 IMPLANT
POSITIONER HEAD DONUT 9IN (MISCELLANEOUS) ×1 IMPLANT
SUT ETHIBOND CT1 BRD #0 30IN (SUTURE) ×2 IMPLANT
SUT ETHILON 3 0 PS 1 (SUTURE) IMPLANT
SUT PROLENE 6 0 BV (SUTURE) ×8 IMPLANT
SUT SILK 0 FSL (SUTURE) IMPLANT
SUT VIC AB 3-0 SH 27X BRD (SUTURE) ×2 IMPLANT
SUT VICRYL 4-0 PS2 18IN ABS (SUTURE) ×2 IMPLANT
SYR 5ML LL (SYRINGE) ×1 IMPLANT
SYR BULB IRRIG 60ML STRL (SYRINGE) ×1 IMPLANT
SYSTEM IPG BAROSTIM 2104 (Generator) IMPLANT
TOWEL GREEN STERILE (TOWEL DISPOSABLE) ×1 IMPLANT
WATER STERILE IRR 1000ML POUR (IV SOLUTION) ×1 IMPLANT

## 2024-01-13 NOTE — Interval H&P Note (Signed)
History and Physical Interval Note:  01/13/2024 6:54 AM  Karen Warner  has presented today for surgery, with the diagnosis of Congestive heart failure, NYHA class III, chronic, systolic.  The various methods of treatment have been discussed with the patient and family. After consideration of risks, benefits and other options for treatment, the patient has consented to  Procedure(s): RIGHT INSERTION OF BAROSTIM (Right) as a surgical intervention.  The patient's history has been reviewed, patient examined, no change in status, stable for surgery.  I have reviewed the patient's chart and labs.  Questions were answered to the patient's satisfaction.     [name]

## 2024-01-13 NOTE — Transfer of Care (Signed)
Immediate Anesthesia Transfer of Care Note  Patient: Karen Warner  Procedure(s) Performed: RIGHT INSERTION OF Alexia Freestone (Right: Chest)  Patient Location: PACU  Anesthesia Type:General  Level of Consciousness: drowsy and patient cooperative  Airway & Oxygen Therapy: Patient Spontanous Breathing and Patient connected to face mask oxygen  Post-op Assessment: Report given to RN and Post -op Vital signs reviewed and stable  Post vital signs: Reviewed and stable  Last Vitals:  Vitals Value Taken Time  BP 127/68 01/13/24 1024  Temp    Pulse 76 01/13/24 1027  Resp 15 01/13/24 1027  SpO2 98 % 01/13/24 1027  Vitals shown include unfiled device data.  Last Pain:  Vitals:   01/13/24 0717  TempSrc:   PainSc: 6       Patients Stated Pain Goal: 1 (01/13/24 0717)  Complications: No notable events documented.

## 2024-01-13 NOTE — Progress Notes (Signed)
Spoke to [employer] who stated that he is aware of today's procedure and is aware of OR time of 0830.

## 2024-01-13 NOTE — Anesthesia Procedure Notes (Signed)
Procedure Name: Intubation Date/Time: 01/13/2024 8:54 AM  Performed by: Audie Pinto, CRNAPre-anesthesia Checklist: Patient identified, Emergency Drugs available, Suction available and Patient being monitored Patient Re-evaluated:Patient Re-evaluated prior to induction Oxygen Delivery Method: Circle system utilized Preoxygenation: Pre-oxygenation with 100% oxygen Induction Type: IV induction Ventilation: Mask ventilation without difficulty Laryngoscope Size: Mac and 4 Grade View: Grade I Tube type: Oral Tube size: 7.0 mm Number of attempts: 1 Airway Equipment and Method: Stylet and Oral airway Placement Confirmation: ETT inserted through vocal cords under direct vision, positive ETCO2 and breath sounds checked- equal and bilateral Secured at: 21 cm Tube secured with: Tape Dental Injury: Teeth and Oropharynx as per pre-operative assessment

## 2024-01-13 NOTE — Progress Notes (Signed)
Dr. Glade Stanford made aware that the patient did not take Carvedilol this morning. Hold Carvedilol per Dr. Sampson Goon.

## 2024-01-13 NOTE — Anesthesia Procedure Notes (Signed)
Arterial Line Insertion Performed by: Audie Pinto, CRNA, CRNA  Patient location: Pre-op. Preanesthetic checklist: patient identified, IV checked, risks and benefits discussed, surgical consent and pre-op evaluation Lidocaine 1% used for infiltration Left, radial was placed Catheter size: 20 G Hand hygiene performed   Attempts: 1 Procedure performed without using ultrasound guided technique. Following insertion, dressing applied and Biopatch. Post procedure assessment: unchanged  Patient tolerated the procedure well with no immediate complications.

## 2024-01-13 NOTE — Anesthesia Preprocedure Evaluation (Signed)
Anesthesia Evaluation  Patient identified by MRN, date of birth, ID band Patient awake    Reviewed: Allergy & Precautions, NPO status , Patient's Chart, lab work & pertinent test results  Airway Mallampati: III  TM Distance: >3 FB Neck ROM: Full    Dental  (+) Dental Advisory Given   Pulmonary asthma , sleep apnea , COPD, former smoker   breath sounds clear to auscultation       Cardiovascular hypertension, Pt. on medications and Pt. on home beta blockers + CAD, + Past MI, + Peripheral Vascular Disease and +CHF  + dysrhythmias + Cardiac Defibrillator  Rhythm:Regular Rate:Normal     Neuro/Psych  Headaches    GI/Hepatic Neg liver ROS,GERD  ,,  Endo/Other  diabetes, Type 2    Renal/GU CRFRenal disease     Musculoskeletal  (+) Arthritis ,  Fibromyalgia -  Abdominal   Peds  Hematology  (+) Blood dyscrasia, anemia   Anesthesia Other Findings   Reproductive/Obstetrics                             Anesthesia Physical Anesthesia Plan  ASA: 4  Anesthesia Plan: General   Post-op Pain Management: Tylenol PO (pre-op)*   Induction: Intravenous  PONV Risk Score and Plan: 3 and Dexamethasone, Ondansetron and Treatment may vary due to age or medical condition  Airway Management Planned: Oral ETT  Additional Equipment: Arterial line  Intra-op Plan:   Post-operative Plan: Extubation in OR  Informed Consent: I have reviewed the patients History and Physical, chart, labs and discussed the procedure including the risks, benefits and alternatives for the proposed anesthesia with the patient or authorized representative who has indicated his/her understanding and acceptance.     Dental advisory given  Plan Discussed with: CRNA  Anesthesia Plan Comments:        Anesthesia Quick Evaluation

## 2024-01-13 NOTE — Op Note (Signed)
    Patient name: Karen Warner MRN: [id number] DOB: [birth year] Sex: female  01/13/2024 Pre-operative Diagnosis: CHF Post-operative diagnosis:  Same Surgeon:  [name] Assistants:  Antony Blackbird, PA Procedure:   right side Barostim implant Anesthesia:  General Blood Loss:  minimal Specimens:  none  Findings:  device at position "A" Device: Serial #[id number] model 01/01/2003 [id number] model 1036 Indications: This is a 73 year old female with NYHA class III heart failure with persistent symptoms despite goal-directed medical therapy.  She comes in today for device implant.  Procedure:  The patient was identified in the holding area and taken to [facility] OR ROOM 17  The patient was then placed supine on the table. general anesthesia was administered.  The patient was prepped and draped in the usual sterile fashion.  A time out was called and antibiotics were administered.  A PA was necessary to expedite the procedure and assist with technical details.  She help with exposure by providing suction and retraction.  She help with suture placement and wound closure.  Ultrasound was used to identify the carotid bifurcation in the right neck.  A incision was made along the anterior border the sternocleidomastoid at the level of the carotid bifurcation.  Cautery was used to divide the subcutaneous tissue down through the platysma muscle.  I then encountered the facial vein.  This was ligated with silk ties.  Next, the carotid bifurcation was exposed.  This was somewhat high in the neck.  I did visualize the hypoglossal nerve but this was protected throughout.  Next a incision was made just below the clavicle.  Cautery was used divide subcutaneous tissue down to the pectoral fascia.  I then created a pocket with blunt and cautery dissection for the battery.  I then used a tonsil clamp to tunnel between the 2 incisions and the device lead was brought through the tunnel.  It was connected to the  generator which was placed into the pocket.  Device testing was then performed.  Impedance was appropriate.  We then began testing.  We had a good hemodynamic response with approximately 10% change in heart rate and blood pressure.  I then sutured the electrode down at its position with two 6-0 Prolene sutures and then repeated testing and we again had a good response.  I then continued to formally secure the electrode by placing 4 additional 6-0 Prolene sutures.  The strain relief loop was sutured to the common carotid adventitia with two 6-0 Prolene sutures.  The wounds were then irrigated with antibiotic solution.  Hemostasis was achieved.  The generator was sutured to the pectoral fascia with 2-0 Ethibond sutures.  I then closed the chest incision by reapproximating the subcutaneous tissue with 3-0 Vicryl and the skin with 4-0 Vicryl.  The neck incision was closed by reapproximating the platysma muscle with 3-0 Vicryl and skin with 4-0 Vicryl.  Dermabond was placed on the incisions.  The patient was successfully extubated and taken recovery in stable condition.   Disposition: To PACU stable.   Juleen China, M.D., [facility] [place] Office: [phone] Pager:  [phone]

## 2024-01-13 NOTE — Anesthesia Postprocedure Evaluation (Signed)
Anesthesia Post Note  Patient: Karen Warner  Procedure(s) Performed: RIGHT INSERTION OF Alexia Freestone (Right: Chest)     Patient location during evaluation: PACU Anesthesia Type: General Level of consciousness: awake and alert Pain management: pain level controlled Vital Signs Assessment: post-procedure vital signs reviewed and stable Respiratory status: spontaneous breathing, nonlabored ventilation, respiratory function stable and patient connected to nasal cannula oxygen Cardiovascular status: blood pressure returned to baseline and stable Postop Assessment: no apparent nausea or vomiting Anesthetic complications: no  No notable events documented.  Last Vitals:  Vitals:   01/13/24 1115 01/13/24 1130  BP: (!) 112/56 (!) 117/57  Pulse: 74 73  Resp: 14 12  Temp:  36.6 C  SpO2: 97% 95%    Last Pain:  Vitals:   01/13/24 1130  TempSrc:   PainSc: 0-No pain                 Kennieth Rad

## 2024-01-14 ENCOUNTER — Telehealth: Payer: Self-pay

## 2024-01-14 ENCOUNTER — Ambulatory Visit (INDEPENDENT_AMBULATORY_CARE_PROVIDER_SITE_OTHER): Payer: Medicare Other | Admitting: Physician Assistant

## 2024-01-14 VITALS — BP 122/75 | HR 70 | Temp 97.3°F | Ht 65.0 in | Wt 203.9 lb

## 2024-01-14 DIAGNOSIS — T8131XA Disruption of external operation (surgical) wound, not elsewhere classified, initial encounter: Secondary | ICD-10-CM

## 2024-01-14 MED ORDER — SULFAMETHOXAZOLE-TRIMETHOPRIM 800-160 MG PO TABS: 1.0000 | ORAL_TABLET | Freq: Two times a day (BID) | ORAL | 0 refills | Status: DC

## 2024-01-14 NOTE — Progress Notes (Signed)
POST OPERATIVE OFFICE NOTE    CC:  F/u for surgery  HPI:  This is a 73 y.o. female who is s/p right side Barostim implant for NYHA class III heart failure with persistent symptoms despite goal-directed medical therapy on 01/13/24 by Dr. Myra Gianotti.  She states she woke up and noticed there was separation of the chest/implant incision.  She denies fever and chills.  Minimal bloody drainage on the band aide dressing.        Allergies  Allergen Reactions   Bee Venom Anaphylaxis   Sumatriptan Shortness Of Breath    Migraine worsened   Amoxicillin-Pot Clavulanate Diarrhea and Nausea And Vomiting    GI Intolerance   Oxycodone Itching and Hives    Other reaction(s): Other (see comments) "Funny feeling in head"  "off the edge"    Buprenorphine Hcl     Other reaction(s): Itching   Clarithromycin Diarrhea    Abdominal pain   Duloxetine Hcl Other (See Comments)    drowsiness   Hydrocodone Itching   Lactose     Upset stomach, gas/bloating    Liraglutide Other (See Comments)    Abdominal discomfort   Tramadol Hcl Itching    Current Outpatient Medications  Medication Sig Dispense Refill   acetaminophen (TYLENOL) 650 MG CR tablet Take 650 mg by mouth every 8 (eight) hours as needed for pain.     albuterol (PROVENTIL) (2.5 MG/3ML) 0.083% nebulizer solution Take 2.5 mg by nebulization every 4 (four) hours as needed for wheezing or shortness of breath.     aspirin 81 MG EC tablet Take 81 mg by mouth daily.     baclofen (LIORESAL) 10 MG tablet Take 10 mg by mouth 3 (three) times daily.     BD PEN NEEDLE NANO 2ND GEN 32G X 4 MM MISC 3 (three) times daily.     carvedilol (COREG) 3.125 MG tablet Take 1 tablet (3.125 mg total) by mouth 2 (two) times daily with a meal. 180 tablet 2   clopidogrel (PLAVIX) 75 MG tablet TAKE 1 TABLET(75 MG) BY MOUTH DAILY 90 tablet 3   Continuous Glucose Sensor (DEXCOM G7 SENSOR) MISC SMARTSIG:1 Topical Every 10 Days     dapagliflozin propanediol (FARXIGA) 10 MG  TABS tablet TAKE 1 TABLET(10 MG) BY MOUTH DAILY BEFORE BREAKFAST 90 tablet 3   dicyclomine (BENTYL) 10 MG capsule TAKE 1 CAPSULE(10 MG) BY MOUTH IN THE MORNING AND AT BEDTIME (Patient taking differently: Take 10 mg by mouth 2 (two) times daily as needed for spasms.) 180 capsule 1   digoxin (LANOXIN) 0.125 MG tablet Take 0.5 tablets (0.0625 mg total) by mouth daily. 15 tablet 3   ezetimibe (ZETIA) 10 MG tablet Take 1 tablet (10 mg total) by mouth daily. 30 tablet 6   famotidine (PEPCID) 40 MG tablet TAKE 1 TABLET(40 MG) BY MOUTH TWICE DAILY 60 tablet 2   folic acid (FOLVITE) 1 MG tablet Take 1 mg by mouth daily.     insulin lispro (HUMALOG) 100 UNIT/ML KwikPen Inject 2-20 Units into the skin 3 (three) times daily with meals.  per sliding scale     lidocaine (LIDODERM) 5 % Place 1 patch onto the skin daily. Remove & Discard patch within 12 hours or as directed by MD (Patient taking differently: Place 1 patch onto the skin daily as needed (Pain). Remove & Discard patch within 12 hours or as directed by MD) 5 patch 0   loratadine (CLARITIN) 10 MG tablet Take 10 mg by mouth daily.  nitroGLYCERIN (NITROSTAT) 0.4 MG SL tablet DISSOLVE 1 TABLET UNDER TONGUE EVERY 5 MINUTES AS NEEDED FOR CHEST PAIN. 25 tablet 3   OZEMPIC, 0.25 OR 0.5 MG/DOSE, 2 MG/3ML SOPN Inject 0.25 mg into the skin every Monday.     pantoprazole (PROTONIX) 40 MG tablet TAKE 1 TABLET(40 MG) BY MOUTH DAILY 90 tablet 3   Plecanatide (TRULANCE) 3 MG TABS Take 1 tablet (3 mg total) by mouth daily. (Patient taking differently: Take 3 mg by mouth daily as needed (constipation).) 30 tablet 2   pregabalin (LYRICA) 50 MG capsule Take 50 mg by mouth 2 (two) times daily.     ranolazine (RANEXA) 500 MG 12 hr tablet Take 1 tablet (500 mg total) by mouth 2 (two) times daily. 180 tablet 3   rosuvastatin (CRESTOR) 40 MG tablet Take 40 mg by mouth at bedtime.     sacubitril-valsartan (ENTRESTO) 24-26 MG TAKE 1 TABLET BY MOUTH TWICE DAILY 60 tablet 0    spironolactone (ALDACTONE) 25 MG tablet Take 1 tablet (25 mg total) by mouth daily. 90 tablet 3   torsemide (DEMADEX) 20 MG tablet 40 mg in the morning and 20 mg in the evening, alternating with 20 mg Twice daily 180 tablet 3   TRESIBA FLEXTOUCH 200 UNIT/ML FlexTouch Pen Inject 35 Units into the skin 2 (two) times daily.     TRINTELLIX 5 MG TABS tablet Take 5 mg by mouth at bedtime.     Ubrogepant 50 MG TABS Take 50 mg by mouth daily as needed (Headache).     valACYclovir (VALTREX) 500 MG tablet Take 500 mg by mouth daily.     VERQUVO 5 MG TABS TAKE 1 TABLET BY MOUTH EVERY DAY 90 tablet 3   vitamin B-12 (CYANOCOBALAMIN) 1000 MCG tablet Take 1,000 mcg by mouth daily.     Vitamin D, Ergocalciferol, (DRISDOL) 1.25 MG (50000 UNIT) CAPS capsule Take 50,000 Units by mouth every 7 (seven) days.     No current facility-administered medications for this visit.     ROS:  See HPI  Physical Exam:       Incision:  superficial dehiscence Extremities:  palpable radial pulses  Neuro: motor and sensation intact and equal B      Assessment/Plan:  This is a 73 y.o. female who is s/p:Barostim implant 12/13/23 by Dr. Linnell Fulling.  She reported separation of the chest/implant incision lateral 2-3 cm long.  No erythema or edema.  Dr. Karin Lieu saw the patient in conjunction with me and he suggested painting the area with betadine then using a stapler to close the incision secondarily.     -Dr. Myra Gianotti cleaned the chest incision with betadine and used a sterile stapler to close the incision.  She was placed on oral antibiotics by him and schedule for f/u.  If she has concerns with erythema or edema at the incision site, fever or chills she should call  she will call our office.   [name] PA-C [facility] [phone]   Clinic MD:  Myra Gianotti

## 2024-01-14 NOTE — Telephone Encounter (Signed)
Triage Call: -pt called stating her incision on her chest is opening up -on chart review, pt is s/p Barostim on 01/13/24 -on call back, pt states its no quite 1/2 opened but worried it will open all the way up.  No bleeding noted at site.   -booked appt for incision evaluation today

## 2024-01-25 NOTE — Progress Notes (Signed)
 [facility]  [address] [place],  [place]  [address] [phone]   Clinic Day:  01/26/2024  Referring physician: Hadley Pen, MD  HISTORY OF PRESENT ILLNESS:  The patient is a 73 y.o. 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  She comes in today to reassess her iron and hemoglobin levels after receiving IV iron in August 2024.  She claims to still feel weak despite her recent IV iron. She denies having any overt forms of blood loss which concern her for progressive anemia.  Of note, this patient does have an ejection fraction of 15%, for which her cardiologist has her placed a device to hopefully bolster her ejection fraction over time.  PHYSICAL EXAM:  Blood pressure 120/66, pulse 74, temperature 97.6 F (36.4 C), temperature source Oral, resp. rate 14, height 5\' 5"  (1.651 m), weight 197 lb 3.2 oz (89.4 kg), SpO2 100%. Wt Readings from Last 3 Encounters:  01/26/24 197 lb 3.2 oz (89.4 kg)  01/14/24 203 lb 14.4 oz (92.5 kg)  01/13/24 200 lb (90.7 kg)   Body mass index is 32.82 kg/m. Performance status (ECOG): 1 - Symptomatic but completely ambulatory Physical Exam Constitutional:      General: She is not in acute distress.    Appearance: Normal appearance. She is normal weight.  HENT:     Head: Normocephalic and atraumatic.  Eyes:     General: No scleral icterus.    Extraocular Movements: Extraocular movements intact.     Conjunctiva/sclera: Conjunctivae normal.     Pupils: Pupils are equal, round, and reactive to light.  Cardiovascular:     Rate and Rhythm: Normal rate and regular rhythm.     Pulses: Normal pulses.     Heart sounds: Normal heart sounds. No murmur heard.    No friction rub. No gallop.  Pulmonary:     Effort: Pulmonary effort is normal. No respiratory distress.     Breath sounds: Normal breath sounds.  Abdominal:     General: Bowel sounds are normal. There is no  distension.     Palpations: Abdomen is soft. There is no hepatomegaly, splenomegaly or mass.     Tenderness: There is no abdominal tenderness.  Musculoskeletal:        General: Normal range of motion.     Cervical back: Normal range of motion and neck supple.     Right lower leg: No edema.     Left lower leg: No edema.  Lymphadenopathy:     Cervical: No cervical adenopathy.  Skin:    General: Skin is warm and dry.  Neurological:     General: No focal deficit present.     Mental Status: She is alert and oriented to person, place, and time. Mental status is at baseline.  Psychiatric:        Mood and Affect: Mood normal.        Behavior: Behavior normal.        Thought Content: Thought content normal.        Judgment: Judgment normal.    LABS:       Latest Ref Rng & Units 01/26/2024    1:57 PM 01/11/2024    2:28 PM 10/13/2023   11:08 AM  CBC  WBC 4.0 - 10.5 K/uL 9.7  8.2    Hemoglobin 12.0 - 15.0 g/dL 52.8  41.3  24.4    01.0   Hematocrit 36.0 - 46.0 % 38.5  40.4  36.0    36.0   Platelets 150 - 400 K/uL 356  274      Latest Reference Range & Units 01/26/24 13:57  Iron 28 - 170 ug/dL 75  UIBC ug/dL 086  TIBC 578 - 469 ug/dL 629  Saturation Ratios 10.4 - 31.8 % 22  Ferritin 11 - 307 ng/mL 59   ASSESSMENT & PLAN:  Assessment/Plan:  A 73 y.o. female with anemia secondary to iron deficiency and previous renal insufficiency.  Her hemoglobin is has slightly fallen since receiving her IV iron a few months ago.  She reiterates that she has not had any forms of blood loss.  Her iron parameters show no evidence of iron deficiency.  Based upon this, I will see her back in 4 months for repeat clinical assessment.  The patient understands all the plans discussed today and is in agreement with them.  Delsie Amador Kirby Funk, MD

## 2024-01-26 ENCOUNTER — Inpatient Hospital Stay (HOSPITAL_BASED_OUTPATIENT_CLINIC_OR_DEPARTMENT_OTHER): Payer: Medicare Other | Admitting: Oncology

## 2024-01-26 ENCOUNTER — Inpatient Hospital Stay: Payer: Medicare Other | Attending: Oncology

## 2024-01-26 ENCOUNTER — Other Ambulatory Visit: Payer: Self-pay | Admitting: Oncology

## 2024-01-26 VITALS — BP 120/66 | HR 74 | Temp 97.6°F | Resp 14 | Ht 65.0 in | Wt 197.2 lb

## 2024-01-26 DIAGNOSIS — D508 Other iron deficiency anemias: Secondary | ICD-10-CM | POA: Diagnosis not present

## 2024-01-26 DIAGNOSIS — N189 Chronic kidney disease, unspecified: Secondary | ICD-10-CM | POA: Diagnosis present

## 2024-01-26 DIAGNOSIS — D631 Anemia in chronic kidney disease: Secondary | ICD-10-CM | POA: Insufficient documentation

## 2024-01-26 LAB — IRON AND TIBC
Iron: 75 ug/dL (ref 28–170)
Saturation Ratios: 22 % (ref 10.4–31.8)
TIBC: 343 ug/dL (ref 250–450)
UIBC: 268 ug/dL

## 2024-01-26 LAB — CBC WITH DIFFERENTIAL (CANCER CENTER ONLY)
Abs Immature Granulocytes: 0.04 10*3/uL (ref 0.00–0.07)
Basophils Absolute: 0.1 10*3/uL (ref 0.0–0.1)
Basophils Relative: 1 %
Eosinophils Absolute: 0.2 10*3/uL (ref 0.0–0.5)
Eosinophils Relative: 2 %
HCT: 38.5 % (ref 36.0–46.0)
Hemoglobin: 12.3 g/dL (ref 12.0–15.0)
Immature Granulocytes: 0 %
Immature Platelet Fraction: 1.7 % (ref 1.2–8.6)
Lymphocytes Relative: 28 %
Lymphs Abs: 2.7 10*3/uL (ref 0.7–4.0)
MCH: 29.8 pg (ref 26.0–34.0)
MCHC: 31.9 g/dL (ref 30.0–36.0)
MCV: 93.2 fL (ref 80.0–100.0)
Monocytes Absolute: 0.7 10*3/uL (ref 0.1–1.0)
Monocytes Relative: 7 %
Neutro Abs: 6 10*3/uL (ref 1.7–7.7)
Neutrophils Relative %: 62 %
Platelet Count: 356 10*3/uL (ref 150–400)
RBC: 4.13 MIL/uL (ref 3.87–5.11)
RDW: 15.4 % (ref 11.5–15.5)
WBC Count: 9.7 10*3/uL (ref 4.0–10.5)
nRBC: 0 % (ref 0.0–0.2)
nRBC: 0 /100{WBCs}

## 2024-01-26 LAB — FERRITIN: Ferritin: 59 ng/mL (ref 11–307)

## 2024-01-27 ENCOUNTER — Encounter: Payer: Self-pay | Admitting: Oncology

## 2024-01-27 ENCOUNTER — Telehealth: Payer: Self-pay | Admitting: Oncology

## 2024-01-27 ENCOUNTER — Other Ambulatory Visit (HOSPITAL_COMMUNITY): Payer: Self-pay

## 2024-01-27 ENCOUNTER — Telehealth (HOSPITAL_COMMUNITY): Payer: Self-pay | Admitting: Pharmacy Technician

## 2024-01-27 ENCOUNTER — Encounter: Payer: Self-pay | Admitting: Cardiology

## 2024-01-27 NOTE — Telephone Encounter (Signed)
 Advanced Heart Failure Patient Advocate Encounter  Received message that patient needs assistance with Verquvo. Need to confirm income and hh size in order to determine eligibility. Awaiting response from patient.

## 2024-01-27 NOTE — Telephone Encounter (Signed)
 01/27/24 Left vm next appt on 05/26/24@230pm 

## 2024-01-27 NOTE — Telephone Encounter (Signed)
 We do not have Verquvo samples. However, I will add Craig Staggers to this message. Since it looks like she has Medicare Part D, we can probably get her a grant to cover the cost.

## 2024-01-28 ENCOUNTER — Telehealth: Payer: Self-pay

## 2024-01-28 NOTE — Telephone Encounter (Signed)
 Latest Reference Range & Units 01/26/24 13:57  Iron 28 - 170 ug/dL 75  UIBC ug/dL 409  TIBC 811 - 914 ug/dL 782  Saturation Ratios 10.4 - 31.8 % 22  Ferritin 11 - 307 ng/mL 59

## 2024-02-01 ENCOUNTER — Ambulatory Visit (INDEPENDENT_AMBULATORY_CARE_PROVIDER_SITE_OTHER): Payer: Medicare Other | Admitting: Physician Assistant

## 2024-02-01 ENCOUNTER — Other Ambulatory Visit: Payer: Self-pay | Admitting: Cardiology

## 2024-02-01 VITALS — BP 112/75 | HR 76 | Temp 97.8°F | Resp 18 | Ht 65.0 in | Wt 197.6 lb

## 2024-02-01 DIAGNOSIS — T8131XA Disruption of external operation (surgical) wound, not elsewhere classified, initial encounter: Secondary | ICD-10-CM

## 2024-02-01 NOTE — Progress Notes (Signed)
 POST OPERATIVE OFFICE NOTE    CC:  F/u for surgery  HPI: Karen Warner is a 73 y.o. female who is s/p Barostim implant by Dr. Myra Gianotti on 01/13/2024.  She presented to our office as a triage visit on 2/13 for a partially dehisced right chest wall incision.  Her incision was thoroughly cleaned with Betadine and stapled closed by Dr. Myra Gianotti.  She returns today for follow-up.  She states she has been keeping her incisions clean, dry, and bandaged.  She denies any drainage from her chest wall incision.  She denies any fevers or chills.   She is meeting with her cardiologist tomorrow.   Allergies  Allergen Reactions   Bee Venom Anaphylaxis   Sumatriptan Shortness Of Breath    Migraine worsened   Amoxicillin-Pot Clavulanate Diarrhea and Nausea And Vomiting    GI Intolerance   Oxycodone Itching and Hives    Other reaction(s): Other (see comments) "Funny feeling in head"  "off the edge"    Buprenorphine Hcl     Other reaction(s): Itching   Clarithromycin Diarrhea    Abdominal pain   Duloxetine Hcl Other (See Comments)    drowsiness   Hydrocodone Itching   Lactose     Upset stomach, gas/bloating    Liraglutide Other (See Comments)    Abdominal discomfort   Tramadol Hcl Itching    Current Outpatient Medications  Medication Sig Dispense Refill   acetaminophen (TYLENOL) 650 MG CR tablet Take 650 mg by mouth every 8 (eight) hours as needed for pain.     albuterol (PROVENTIL) (2.5 MG/3ML) 0.083% nebulizer solution Take 2.5 mg by nebulization every 4 (four) hours as needed for wheezing or shortness of breath.     aspirin 81 MG EC tablet Take 81 mg by mouth daily.     baclofen (LIORESAL) 10 MG tablet Take 10 mg by mouth 3 (three) times daily.     BD PEN NEEDLE NANO 2ND GEN 32G X 4 MM MISC 3 (three) times daily.     carvedilol (COREG) 3.125 MG tablet Take 1 tablet (3.125 mg total) by mouth 2 (two) times daily with a meal. 180 tablet 2   clopidogrel (PLAVIX) 75 MG tablet TAKE 1  TABLET(75 MG) BY MOUTH DAILY 90 tablet 3   Continuous Glucose Sensor (DEXCOM G7 SENSOR) MISC SMARTSIG:1 Topical Every 10 Days     dapagliflozin propanediol (FARXIGA) 10 MG TABS tablet TAKE 1 TABLET(10 MG) BY MOUTH DAILY BEFORE BREAKFAST 90 tablet 3   dicyclomine (BENTYL) 10 MG capsule TAKE 1 CAPSULE(10 MG) BY MOUTH IN THE MORNING AND AT BEDTIME (Patient taking differently: Take 10 mg by mouth 2 (two) times daily as needed for spasms.) 180 capsule 1   digoxin (LANOXIN) 0.125 MG tablet Take 0.5 tablets (0.0625 mg total) by mouth daily. 15 tablet 3   ezetimibe (ZETIA) 10 MG tablet Take 1 tablet (10 mg total) by mouth daily. 30 tablet 6   famotidine (PEPCID) 40 MG tablet TAKE 1 TABLET(40 MG) BY MOUTH TWICE DAILY 60 tablet 2   folic acid (FOLVITE) 1 MG tablet Take 1 mg by mouth daily.     insulin lispro (HUMALOG) 100 UNIT/ML KwikPen Inject 2-20 Units into the skin 3 (three) times daily with meals.  per sliding scale     lidocaine (LIDODERM) 5 % Place 1 patch onto the skin daily. Remove & Discard patch within 12 hours or as directed by MD (Patient taking differently: Place 1 patch onto the skin daily as needed (Pain). Remove &  Discard patch within 12 hours or as directed by MD) 5 patch 0   loratadine (CLARITIN) 10 MG tablet Take 10 mg by mouth daily.     nitroGLYCERIN (NITROSTAT) 0.4 MG SL tablet DISSOLVE 1 TABLET UNDER TONGUE EVERY 5 MINUTES AS NEEDED FOR CHEST PAIN. 25 tablet 3   OZEMPIC, 0.25 OR 0.5 MG/DOSE, 2 MG/3ML SOPN Inject 0.25 mg into the skin every Monday.     pantoprazole (PROTONIX) 40 MG tablet TAKE 1 TABLET(40 MG) BY MOUTH DAILY 90 tablet 3   Plecanatide (TRULANCE) 3 MG TABS Take 1 tablet (3 mg total) by mouth daily. (Patient taking differently: Take 3 mg by mouth daily as needed (constipation).) 30 tablet 2   pregabalin (LYRICA) 50 MG capsule Take 50 mg by mouth 2 (two) times daily.     ranolazine (RANEXA) 500 MG 12 hr tablet Take 1 tablet (500 mg total) by mouth 2 (two) times daily. 180  tablet 3   rosuvastatin (CRESTOR) 40 MG tablet Take 40 mg by mouth at bedtime.     sacubitril-valsartan (ENTRESTO) 24-26 MG TAKE 1 TABLET BY MOUTH TWICE DAILY 60 tablet 0   sulfamethoxazole-trimethoprim (BACTRIM DS) 800-160 MG tablet Take 1 tablet by mouth 2 (two) times daily. 20 tablet 0   torsemide (DEMADEX) 20 MG tablet 40 mg in the morning and 20 mg in the evening, alternating with 20 mg Twice daily 180 tablet 3   TRESIBA FLEXTOUCH 200 UNIT/ML FlexTouch Pen Inject 35 Units into the skin 2 (two) times daily.     TRINTELLIX 5 MG TABS tablet Take 5 mg by mouth at bedtime.     Ubrogepant 50 MG TABS Take 50 mg by mouth daily as needed (Headache).     valACYclovir (VALTREX) 500 MG tablet Take 500 mg by mouth daily.     VERQUVO 5 MG TABS TAKE 1 TABLET BY MOUTH EVERY DAY 90 tablet 3   vitamin B-12 (CYANOCOBALAMIN) 1000 MCG tablet Take 1,000 mcg by mouth daily.     Vitamin D, Ergocalciferol, (DRISDOL) 1.25 MG (50000 UNIT) CAPS capsule Take 50,000 Units by mouth every 7 (seven) days.     No current facility-administered medications for this visit.     ROS:  See HPI  Physical Exam:  Incision: Right sided neck incision well-healed.  Right sided chest wall incision mostly healed with some scabbing.  All staples removed without issue Neuro: Neurologically intact, alert and orient x 4    Assessment/Plan:  This is a 73 y.o. female who is here for repeat wound check  -The patient presents today for repeat wound check after barrow stem implant on 2/12. Her right chest wall incision had partially dehisced at her previous office visit.  This was cleaned and stapled by Dr. Myra Gianotti. -At today's office visit her right sided neck incision is well-healed.  Her right sided chest wall incision is mostly healed with light scabbing.  All staples were removed without issue.  She has no signs of infection at her surgical sites -She can follow-up with our office as needed   Loel Dubonnet, PA-C [facility] [phone]  Clinic MD:  Myra Gianotti

## 2024-02-02 ENCOUNTER — Encounter: Payer: Self-pay | Admitting: Student

## 2024-02-02 ENCOUNTER — Other Ambulatory Visit: Payer: Medicare Other

## 2024-02-02 ENCOUNTER — Ambulatory Visit: Payer: Medicare Other | Attending: Student | Admitting: Student

## 2024-02-02 VITALS — BP 98/58 | HR 82 | Ht 65.0 in | Wt 200.6 lb

## 2024-02-02 DIAGNOSIS — I255 Ischemic cardiomyopathy: Secondary | ICD-10-CM | POA: Diagnosis not present

## 2024-02-02 DIAGNOSIS — Z95810 Presence of automatic (implantable) cardiac defibrillator: Secondary | ICD-10-CM | POA: Diagnosis present

## 2024-02-02 DIAGNOSIS — I5022 Chronic systolic (congestive) heart failure: Secondary | ICD-10-CM | POA: Diagnosis not present

## 2024-02-02 NOTE — Progress Notes (Signed)
  Cardiology Office Note:   Date:  02/02/2024  ID:  Karen Warner, DOB 1951/02/14, MRN [id number]  Primary Cardiologist: Thomasene Ripple, DO Electrophysiologist: Lewayne Bunting, MD   History of Present Illness:   Karen Warner is a 73 y.o. female with h/o chronic systolic CHF, ICM, CAD, HTN, HLD, COPD, OSA not on CPAP, DM2, and GERD seen today for routine electrophysiology followup.   S/p BAT implant 01/13/2024 by Dr. Myra Gianotti  Since last being seen in our clinic the patient reports doing about the same. She has not noticed any specific changes in her symptoms. [id number] done today, her functional status is quite limited, experiencing fatigue or SOB with most activities.   Main goals are to cook; Visiting son and daughter in law next door. Want's to go back to bigger get togethers and increase functional status. Father is 81 and she wants to visit him more and clean.    Review of systems complete and found to be negative unless listed in HPI.    EP Information / Studies Reviewed:    EKG is not ordered today. EKG from 01/12/2024 reviewed which showed AS-VP rhythm at 74 bpm   Arrhythmia/Device History [employer] BiV ICD implanted 12/31/2020 for LBBB, HFrEF/ICM Barostim implanted 01/13/2024   Barostim Interrogation- Performed personally and reviewed in detail today,  See scanned report  ICD/PPM interrogation - Not performed today. See last PaceArt report    Physical Exam:   VS:  BP (!) 98/58   Pulse 82   Ht 5\' 5"  (1.651 m)   Wt 200 lb 9.6 oz (91 kg)   SpO2 95%   BMI 33.38 kg/m    Wt Readings from Last 3 Encounters:  02/02/24 200 lb 9.6 oz (91 kg)  02/01/24 197 lb 9.6 oz (89.6 kg)  01/26/24 197 lb 3.2 oz (89.4 kg)     GEN: Well nourished, well developed in no acute distress NECK: No JVD; No carotid bruits CARDIAC: Regular rate and rhythm, no murmurs, rubs, gallops RESPIRATORY:  Clear to auscultation without rales, wheezing or rhonchi  ABDOMEN: Soft,  non-tender, non-distended EXTREMITIES:  No edema; No deformity   ASSESSMENT AND PLAN:    Chronic systolic CHF s/p Chief Financial Officer and Barostim implantation NYHA III symptoms.   Device titrated from 1.0 millamp to 4.0 milliamp by increments of 0.4 with good blood pressure response. Pt had stim at 5.0 ma described as "burning" sensation in her neck.  Device impedence stable. Pt goals are to cook for family and help her dad (99yo) around the house)  Normal device function See scanned report. Will follow up in 2-3 weeks to continue titration with goal of 6-8 milliamps for chronic settings.    Disposition:   Follow up with EP APP in 2-3 weeks as above.   Signed, Graciella Freer, PA-C

## 2024-02-02 NOTE — Patient Instructions (Signed)
 Medication Instructions:   Your physician recommends that you continue on your current medications as directed. Please refer to the Current Medication list given to you today.  *If you need a refill on your cardiac medications before your next appointment, please call your pharmacy*   Lab Work:  NONE ORDERED  TODAY    If you have labs (blood work) drawn today and your tests are completely normal, you will receive your results only by: MyChart Message (if you have MyChart) OR A paper copy in the mail If you have any lab test that is abnormal or we need to change your treatment, we will call you to review the results.   Testing/Procedures: NONE ORDERED  TODAY    Follow-Up: At [facility], you and your health needs are our priority.  As part of our continuing mission to provide you with exceptional heart care, we have created designated Provider Care Teams.  These Care Teams include your primary Cardiologist (physician) and Advanced Practice Providers (APPs -  Physician Assistants and Nurse Practitioners) who all work together to provide you with the care you need, when you need it.  We recommend signing up for the patient portal called "MyChart".  Sign up information is provided on this After Visit Summary.  MyChart is used to connect with patients for Virtual Visits (Telemedicine).  Patients are able to view lab/test results, encounter notes, upcoming appointments, etc.  Non-urgent messages can be sent to your provider as well.   To learn more about what you can do with MyChart, go to [email or web address].    Your next appointment:  AS SCHEDULED   Provider:    You may see  Casimiro Needle "Otilio Saber, PA-C      Other Instructions    1st Floor: - Lobby - Registration  - Pharmacy  - Lab - Cafe  2nd Floor: - PV Lab - Diagnostic Testing (echo, CT, nuclear med)  3rd Floor: - Vacant  4th Floor: - TCTS (cardiothoracic surgery) - AFib Clinic - Structural Heart  Clinic - Vascular Surgery  - Vascular Ultrasound  5th Floor: - [facility] (general and EP) - Clinical Pharmacy for coumadin, hypertension, lipid, weight-loss medications, and med management appointments    Valet parking services will be available as well.

## 2024-02-05 NOTE — Progress Notes (Signed)
 Remote ICD transmission.

## 2024-02-05 NOTE — Addendum Note (Signed)
 Addended by: Elease Etienne A on: 02/05/2024 12:17 PM   Modules accepted: Orders

## 2024-02-08 ENCOUNTER — Telehealth (HOSPITAL_COMMUNITY): Payer: Self-pay

## 2024-02-08 NOTE — Telephone Encounter (Signed)
 Called to confirm/remind patient of their appointment at the [facility] on 02/09/24.   Patient reminded to bring all medications and/or complete list.  Confirmed patient has transportation. Gave directions, instructed to utilize valet parking.  Confirmed appointment prior to ending call.

## 2024-02-09 ENCOUNTER — Encounter (HOSPITAL_COMMUNITY): Payer: Self-pay

## 2024-02-09 ENCOUNTER — Ambulatory Visit (HOSPITAL_COMMUNITY)
Admission: RE | Admit: 2024-02-09 | Discharge: 2024-02-09 | Disposition: A | Discharge status: Home / Self Care | Payer: Medicare Other | Source: Ambulatory Visit | Attending: Family Medicine | Admitting: Family Medicine

## 2024-02-09 VITALS — BP 90/60 | HR 81 | Wt 198.0 lb

## 2024-02-09 DIAGNOSIS — I2510 Atherosclerotic heart disease of native coronary artery without angina pectoris: Secondary | ICD-10-CM | POA: Insufficient documentation

## 2024-02-09 DIAGNOSIS — Z87891 Personal history of nicotine dependence: Secondary | ICD-10-CM | POA: Insufficient documentation

## 2024-02-09 DIAGNOSIS — I130 Hypertensive heart and chronic kidney disease with heart failure and stage 1 through stage 4 chronic kidney disease, or unspecified chronic kidney disease: Secondary | ICD-10-CM | POA: Insufficient documentation

## 2024-02-09 DIAGNOSIS — I5022 Chronic systolic (congestive) heart failure: Secondary | ICD-10-CM | POA: Insufficient documentation

## 2024-02-09 DIAGNOSIS — Z7982 Long term (current) use of aspirin: Secondary | ICD-10-CM | POA: Diagnosis not present

## 2024-02-09 DIAGNOSIS — E669 Obesity, unspecified: Secondary | ICD-10-CM | POA: Insufficient documentation

## 2024-02-09 DIAGNOSIS — E1122 Type 2 diabetes mellitus with diabetic chronic kidney disease: Secondary | ICD-10-CM | POA: Insufficient documentation

## 2024-02-09 DIAGNOSIS — Z6832 Body mass index (BMI) 32.0-32.9, adult: Secondary | ICD-10-CM | POA: Diagnosis not present

## 2024-02-09 DIAGNOSIS — Z955 Presence of coronary angioplasty implant and graft: Secondary | ICD-10-CM | POA: Diagnosis not present

## 2024-02-09 DIAGNOSIS — Z79899 Other long term (current) drug therapy: Secondary | ICD-10-CM | POA: Insufficient documentation

## 2024-02-09 DIAGNOSIS — Z7984 Long term (current) use of oral hypoglycemic drugs: Secondary | ICD-10-CM | POA: Insufficient documentation

## 2024-02-09 DIAGNOSIS — Z7902 Long term (current) use of antithrombotics/antiplatelets: Secondary | ICD-10-CM | POA: Insufficient documentation

## 2024-02-09 DIAGNOSIS — N183 Chronic kidney disease, stage 3 unspecified: Secondary | ICD-10-CM | POA: Diagnosis not present

## 2024-02-09 DIAGNOSIS — Z794 Long term (current) use of insulin: Secondary | ICD-10-CM | POA: Diagnosis not present

## 2024-02-09 DIAGNOSIS — J449 Chronic obstructive pulmonary disease, unspecified: Secondary | ICD-10-CM | POA: Diagnosis not present

## 2024-02-09 DIAGNOSIS — I255 Ischemic cardiomyopathy: Secondary | ICD-10-CM | POA: Diagnosis not present

## 2024-02-09 DIAGNOSIS — Z7985 Long-term (current) use of injectable non-insulin antidiabetic drugs: Secondary | ICD-10-CM | POA: Diagnosis not present

## 2024-02-09 LAB — BASIC METABOLIC PANEL
Anion gap: 9 (ref 5–15)
BUN: 17 mg/dL (ref 8–23)
CO2: 27 mmol/L (ref 22–32)
Calcium: 9.2 mg/dL (ref 8.9–10.3)
Chloride: 106 mmol/L (ref 98–111)
Creatinine, Ser: 1.66 mg/dL — ABNORMAL HIGH (ref 0.44–1.00)
GFR, Estimated: 32 mL/min — ABNORMAL LOW (ref >60)
Glucose, Bld: 164 mg/dL — ABNORMAL HIGH (ref 70–99)
Potassium: 3.9 mmol/L (ref 3.5–5.1)
Sodium: 142 mmol/L (ref 135–145)

## 2024-02-09 LAB — DIGOXIN LEVEL: Digoxin Level: 0.4 ng/mL — ABNORMAL LOW (ref 0.8–2.0)

## 2024-02-09 MED ORDER — VERQUVO 2.5 MG PO TABS: 2.5000 mg | ORAL_TABLET | Freq: Every day | ORAL | 11 refills | Status: DC

## 2024-02-09 NOTE — Progress Notes (Signed)
 PCP: Hadley Pen, MD Cardiology: Dr. Servando Salina HF Cardiology: Dr. Shirlee Latch   73 y.o. with history of CAD and ischemic cardiomyopathy and COPD. Patient had PCI to mid/distal LAD in 2020, then had unstable angina in 9/21 with DES to 70% stenosis proximal LAD.  She has had a cardiomyopathy for a number of years, [employer] CRT-D device placed in 2022.  Last echo in 8/24 showed EF < 20%, moderate LV dilation, mild LVH, RV normal.  She additionally has a diagnosis of COPD, quit smoking in 2021. She lives in [place], originally from [place] but moved here to be near family.   LHC/RHC in 11/24 showed patent LAD stents with distal edge dissection in the apical LAD (no change from prior films), normal filling pressures, CI low at 2.12.   S/p barostim implant 2/25.  Today she returns for HF follow up. Main complaint is occasional, stabbing right sided head pain, does not feel like migraine. Has some R-sided facial swelling. She has SOB walking up steps or walking further distances on flat found. Denies palpitations, abnormal bleeding, CP, dizziness, edema, or PND/Orthopnea. Appetite ok. No fever or chills. Weight at home 194 pounds. Taking all medications. Feels "a little better" since barostim, can get around better and feels more energetic in the morning.  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  PMH: 1. Fe deficiency anemia 2. OSA: Does not tolerate CPAP.  3. HTN 4. IBS 5. COPD: Quit smoking in 2021.  6. Depression  7. Type 2 diabetes 8. CAD: PCI mid-distal LAD in 2020.  - LHC (9/21): Unstable angina, 70% proximal LAD treated with DES.  - LHC (11/24): Patent LAD stents with distal edge dissection  in the apical LAD (no change from prior films) 9. Chronic systolic CHF: Suspect ischemic cardiomyopathy.  [employer] CRT-D device.  - Echo (8/22): EF 25-30% - Echo (3/23): EF 30% - Echo (8/24): EF < 20%, moderate LV dilation, mild LVH, RV normal - RHC (11/24): mean RA 3, PA 35/9, mean PCWP 9, CI 2.12, PAPi 8.7 - s/p  barostim 2/25 10. CKD stage 3  FH: No sudden death or cardiomyopathy  Social History   Socioeconomic History   Marital status: Significant Other    Spouse name: Not on file   Number of children: 3   Years of education: Not on file   Highest education level: Not on file  Occupational History   Occupation: retired  Tobacco Use   Smoking status: Former    Current packs/day: 0.00    Types: Cigarettes    Quit date: 2021    Years since quitting: 4.1   Smokeless tobacco: Never  Vaping Use   Vaping status: Never Used  Substance and Sexual Activity   Alcohol use: Never   Drug use: Never   Sexual activity: Not on file  Other Topics Concern   Not on file  Social History Narrative   Not on file   Social Drivers of Health   Financial Resource Strain: Not on file  Food Insecurity: Low Risk  (10/20/2023)   Received from [facility]   Hunger Vital Sign    Worried About Running Out of Food in the Last Year: Never true  Ran Out of Food in the Last Year: Never true  Transportation Needs: No Transportation Needs (10/20/2023)   Received from [facility]    In the past 12 months, has lack of reliable transportation kept you from medical appointments, meetings, work or from getting things needed for daily living? : No  Physical Activity: Not on file  Stress: Not on file  Social Connections: Not on file  Intimate Partner Violence: Not on file   ROS: All systems reviewed and negative except as per HPI.   Current Outpatient Medications  Medication Sig Dispense Refill   acetaminophen (TYLENOL) 650 MG CR tablet Take 650 mg by mouth every 8  (eight) hours as needed for pain.     albuterol (PROVENTIL) (2.5 MG/3ML) 0.083% nebulizer solution Take 2.5 mg by nebulization every 4 (four) hours as needed for wheezing or shortness of breath.     aspirin 81 MG EC tablet Take 81 mg by mouth daily.     baclofen (LIORESAL) 10 MG tablet Take 10 mg by mouth 3 (three) times daily.     BD PEN NEEDLE NANO 2ND GEN 32G X 4 MM MISC 3 (three) times daily.     carvedilol (COREG) 3.125 MG tablet Take 1 tablet (3.125 mg total) by mouth 2 (two) times daily with a meal. 180 tablet 2   clopidogrel (PLAVIX) 75 MG tablet TAKE 1 TABLET(75 MG) BY MOUTH DAILY 90 tablet 3   Continuous Glucose Sensor (DEXCOM G7 SENSOR) MISC SMARTSIG:1 Topical Every 10 Days     dapagliflozin propanediol (FARXIGA) 10 MG TABS tablet TAKE 1 TABLET(10 MG) BY MOUTH DAILY BEFORE BREAKFAST 90 tablet 3   dicyclomine (BENTYL) 10 MG capsule TAKE 1 CAPSULE(10 MG) BY MOUTH IN THE MORNING AND AT BEDTIME (Patient taking differently: Take 10 mg by mouth 2 (two) times daily as needed for spasms.) 180 capsule 1   digoxin (LANOXIN) 0.125 MG tablet Take 0.5 tablets (0.0625 mg total) by mouth daily. 15 tablet 3   ezetimibe (ZETIA) 10 MG tablet Take 1 tablet (10 mg total) by mouth daily. 30 tablet 6   famotidine (PEPCID) 40 MG tablet TAKE 1 TABLET(40 MG) BY MOUTH TWICE DAILY 60 tablet 2   folic acid (FOLVITE) 1 MG tablet Take 1 mg by mouth daily.     insulin lispro (HUMALOG) 100 UNIT/ML KwikPen Inject 2-20 Units into the skin 3 (three) times daily with meals.  per sliding scale     lidocaine (LIDODERM) 5 % Place 1 patch onto the skin daily. Remove & Discard patch within 12 hours or as directed by MD (Patient taking differently: Place 1 patch onto the skin daily as needed (Pain). Remove & Discard patch within 12 hours or as directed by MD) 5 patch 0   loratadine (CLARITIN) 10 MG tablet Take 10 mg by mouth daily.     nitroGLYCERIN (NITROSTAT) 0.4 MG SL tablet DISSOLVE 1 TABLET UNDER TONGUE EVERY 5 MINUTES AS  NEEDED FOR CHEST PAIN. 25 tablet 3   OZEMPIC, 0.25 OR 0.5 MG/DOSE, 2 MG/3ML SOPN Inject 0.5 mg into the skin every Monday.     pantoprazole (PROTONIX) 40 MG tablet TAKE 1 TABLET(40 MG) BY MOUTH DAILY 90 tablet 3   Plecanatide (TRULANCE) 3 MG TABS Take 1 tablet (3 mg total) by mouth daily. (Patient taking differently: Take 3 mg by mouth daily as needed (constipation).) 30 tablet 2   pregabalin (LYRICA) 50 MG capsule Take 50 mg by mouth 2 (two) times daily.  ranolazine (RANEXA) 500 MG 12 hr tablet Take 1 tablet (500 mg total) by mouth 2 (two) times daily. 180 tablet 3   rosuvastatin (CRESTOR) 40 MG tablet Take 40 mg by mouth at bedtime.     sacubitril-valsartan (ENTRESTO) 24-26 MG TAKE 1 TABLET BY MOUTH TWICE DAILY 180 tablet 1   torsemide (DEMADEX) 20 MG tablet 40 mg in the morning and 20 mg in the evening, alternating with 20 mg Twice daily 180 tablet 3   TRESIBA FLEXTOUCH 200 UNIT/ML FlexTouch Pen Inject 35 Units into the skin 2 (two) times daily.     TRINTELLIX 5 MG TABS tablet Take 5 mg by mouth at bedtime.     Ubrogepant 50 MG TABS Take 50 mg by mouth daily as needed (Headache).     valACYclovir (VALTREX) 500 MG tablet Take 500 mg by mouth daily.     VERQUVO 5 MG TABS TAKE 1 TABLET BY MOUTH EVERY DAY 90 tablet 3   vitamin B-12 (CYANOCOBALAMIN) 1000 MCG tablet Take 1,000 mcg by mouth daily.     Vitamin D, Ergocalciferol, (DRISDOL) 1.25 MG (50000 UNIT) CAPS capsule Take 50,000 Units by mouth every 7 (seven) days.     No current facility-administered medications for this encounter.   Wt Readings from Last 3 Encounters:  02/09/24 89.8 kg (198 lb)  02/02/24 91 kg (200 lb 9.6 oz)  02/01/24 89.6 kg (197 lb 9.6 oz)   BP 90/60   Pulse 81   Wt 89.8 kg (198 lb)   SpO2 96%   BMI 32.95 kg/m  Physical Exam General:  NAD. No resp difficulty, walked into clinic, fatigued-appearing HEENT: Normal Neck: Supple. No JVD. Cor: Regular rate & rhythm. No rubs, gallops or murmurs. Lungs:  Clear Abdomen: Soft, obese, nontender, nondistended.  Extremities: No cyanosis, clubbing, rash, edema Neuro: Alert & oriented x 3, moves all 4 extremities w/o difficulty. Affect pleasant.  Assessment/Plan: 1. Chronic systolic CHF: Ischemic cardiomyopathy.  [employer] CRT-D device.  Echo in 8/24 showed EF < 20%, moderate LV dilation, mild LVH, RV normal.  This is somewhat worse than priors. LHC/RHC in 11/24 showed low CI at 2.12, normal filling pressures, unchanged coronary anatomy with no severe stenoses. S/p barostim 2/25. NYHA class III symptoms with prominent fatigue, somewhat better since barostim.  She is not volume overloaded on exam or device interrogation. - With soft BP and prominent fatigue, decrease Verquvo to 2.5 mg daily. May need to stop all together. - Continue torsemide 40 qam/20 qpm alternating with 20 mg bid.   - Continue spironolactone 25 mg at bedtime. BMET today. - Continue Coreg 3.125 mg bid.  - Continue Entresto 24/26 bid. - Continue dapagliflozin 10 daily.  - Continue digoxin 0.0625 mg daily. Check dig level. - She has been referred to cardiac rehab at the hospital in [place].  2.  CAD: PCI to mid/distal LAD in 2020 and proximal LAD in 2021.  LHC in 11/24 showed stable coronary anatomy with no severe stenoses. No chest pain. - Continue ranolazine. - Continue ASA/Plavix.  - Continue Crestor + Zetia, goal LDL < 55.  3. COPD: History of smoking, quit 2021.  Should get eventual PFTs.  4. CKD 3: Follows with nephology at [facility]. - Avoid hypotension, med changes as above. 5. Obesity: Body mass index is 32.95 kg/m. - Continue semaglutide  Follow up in 4-6 weeks with PharmD (BP check, may need to stop Verquvo) and 3 months with Dr. Shirlee Latch.  [facility] FNP-BC 02/09/2024

## 2024-02-09 NOTE — Patient Instructions (Addendum)
 Thank you for coming in today  If you had labs drawn today, any labs that are abnormal the clinic will call you No news is good news  PLEASE CHECK BLOOD PRESSURE DAILY  Medications: Decrease Verquvo to 2.5 mg daily   Follow up appointments:  Your physician recommends that you schedule a follow-up appointment in:  4-6 weeks with pharmacy  3 months With Dr. Earlean Shawl will receive a reminder letter in the mail a few months in advance. If you don't receive a letter, please call our office to schedule the follow-up appointment.    Do the following things EVERYDAY: Weigh yourself in the morning before breakfast. Write it down and keep it in a log. Take your medicines as prescribed Eat low salt foods--Limit salt (sodium) to 2000 mg per day.  Stay as active as you can everyday Limit all fluids for the day to less than 2 liters   At the [facility], you and your health needs are our priority. As part of our continuing mission to provide you with exceptional heart care, we have created designated Provider Care Teams. These Care Teams include your primary Cardiologist (physician) and Advanced Practice Providers (APPs- Physician Assistants and Nurse Practitioners) who all work together to provide you with the care you need, when you need it.   You may see any of the following providers on your designated Care Team at your next follow up: Dr Arvilla Meres Dr Marca Ancona Dr. Marcos Eke, NP Robbie Lis, [place] [facility] [place], [place] Brynda Peon, NP Karle Plumber, PharmD   Please be sure to bring in all your medications bottles to every appointment.    Thank you for choosing [facility]  If you have any questions or concerns before your next appointment please send [place] a message through [place] or call our office at [phone].    TO LEAVE A MESSAGE FOR THE NURSE SELECT OPTION 2, PLEASE  LEAVE A MESSAGE INCLUDING: YOUR NAME DATE OF BIRTH CALL BACK NUMBER REASON FOR CALL**this is important as we prioritize the call backs  YOU WILL RECEIVE A CALL BACK THE SAME DAY AS LONG AS YOU CALL BEFORE 4:00 PM

## 2024-02-16 ENCOUNTER — Encounter: Payer: Self-pay | Admitting: Oncology

## 2024-02-18 ENCOUNTER — Telehealth: Payer: Self-pay

## 2024-02-18 NOTE — Telephone Encounter (Signed)
 Patient is complaining of headache since barostim placement. Advised patient I would forward to [place] to advise further.

## 2024-02-18 NOTE — Telephone Encounter (Signed)
 Pt has been having headaches and wants to know if anything wrong with her device.

## 2024-02-19 NOTE — Telephone Encounter (Signed)
Attempted phone call to pt and left voicemail message to contact office at [phone]. 

## 2024-02-24 ENCOUNTER — Other Ambulatory Visit (HOSPITAL_COMMUNITY): Payer: Self-pay | Admitting: Cardiology

## 2024-02-24 ENCOUNTER — Encounter: Payer: Self-pay | Admitting: Cardiology

## 2024-02-25 NOTE — Progress Notes (Unsigned)
  Cardiology Office Note:   Date:  02/26/2024  ID:  [name], DOB Jan 23, 1951, MRN [id number]  Primary Cardiologist: Thomasene Ripple, DO Electrophysiologist: Lewayne Bunting, MD   History of Present Illness:   Karen Warner is a 73 y.o. female with h/o chronic systolic CHF, ICM, CAD, HTN, HLD, COPD, OSA not on CPAP, DM2, and GERD seen today for routine electrophysiology followup.   At last visit, Device titrated from 1.0 millamp to 4.0 milliamp by increments of 0.4 with good blood pressure response. Pt had stim at 5.0 ma described as "burning" sensation in her neck.   Since last being seen in our clinic the patient reports doing well. She had not noted .  she denies chest pain, palpitations, dyspnea, PND, orthopnea, nausea, vomiting, dizziness, syncope, edema, weight gain, or early satiety.   Review of systems complete and found to be negative unless listed in HPI.    EP Information / Studies Reviewed:    EKG is not ordered today. EKG from 01/12/2024 previously reviewed.   Arrhythmia/Device History [employer] BiV ICD implanted 12/31/2020 for LBBB, HFrEF/ICM Barostim implanted 01/13/2024   Barostim Interrogation- Performed personally and reviewed in detail today,  See scanned report  ICD/PPM interrogation - Not performed today. See last PaceArt report    Physical Exam:   VS:  BP 100/66   Pulse 78   Ht 5\' 5"  (1.651 m)   Wt 200 lb (90.7 kg)   SpO2 96%   BMI 33.28 kg/m    Wt Readings from Last 3 Encounters:  02/26/24 200 lb (90.7 kg)  02/09/24 198 lb (89.8 kg)  02/02/24 200 lb 9.6 oz (91 kg)     GEN: Well nourished, well developed in no acute distress NECK: No JVD; No carotid bruits CARDIAC: Regular rate and rhythm, no murmurs, rubs, gallops RESPIRATORY:  Clear to auscultation without rales, wheezing or rhonchi  ABDOMEN: Soft, non-tender, non-distended EXTREMITIES:  No edema; No deformity   ASSESSMENT AND PLAN:    Chronic systolic CHF s/p Engineer, manufacturing and Barostim implantation NYHA II-III symptoms.   Device titrated from 4.0 millamp to 5.6 milliamp by increments of 0.4 with good blood pressure response.  Pt again had stim described as a "burning" sensation Device impedence stable. Pt goals are to continue to improve her functional status and tolerance for cooking and visiting with her family. Normal device function See scanned report. Will follow up in 4 weeks to continue titration with goal of 6-8 milliamps for chronic settings.   Migraines HA Chronic component, somewhat exacerbated after surgery.  Do not think this is directly from device THERAPY.  Recommended PCP follow up and continued monitoring.    Disposition:   Follow up with EP APP in in 4 weeks  Signed, Graciella Freer, PA-C

## 2024-02-25 NOTE — Telephone Encounter (Signed)
 Patient "sharp pain in the side of my head." Condones this headache to the best of her remembrance started since implant. It's intermittent, comes "for a few seconds, then goes away." Says it's happening more days than not, and sometimes, it is multiple times per day. She's been taking tylenol, but it seems to have no effect on the head pain. Advised her that per Mardelle Matte we could turn device down. She is scheduled for appt here tomorrow with Mardelle Matte already. Will route to him to make him aware.

## 2024-02-25 NOTE — Telephone Encounter (Signed)
 Left detailed message per DPR. Also called husband and he did pick up-asked that he have her call us.

## 2024-02-26 ENCOUNTER — Ambulatory Visit: Attending: Student | Admitting: Student

## 2024-02-26 ENCOUNTER — Encounter: Payer: Self-pay | Admitting: Student

## 2024-02-26 VITALS — BP 100/66 | HR 78 | Ht 65.0 in | Wt 200.0 lb

## 2024-02-26 DIAGNOSIS — I5022 Chronic systolic (congestive) heart failure: Secondary | ICD-10-CM | POA: Diagnosis present

## 2024-02-26 DIAGNOSIS — I255 Ischemic cardiomyopathy: Secondary | ICD-10-CM

## 2024-02-26 NOTE — Patient Instructions (Signed)
 Medication Instructions:  Your physician recommends that you continue on your current medications as directed. Please refer to the Current Medication list given to you today.  *If you need a refill on your cardiac medications before your next appointment, please call your pharmacy*  Lab Work: None ordered If you have labs (blood work) drawn today and your tests are completely normal, you will receive your results only by: MyChart Message (if you have MyChart) OR A paper copy in the mail If you have any lab test that is abnormal or we need to change your treatment, we will call you to review the results.  Follow-Up: At [facility], you and your health needs are our priority.  As part of our continuing mission to provide you with exceptional heart care, our providers are all part of one team.  This team includes your primary Cardiologist (physician) and Advanced Practice Providers or APPs (Physician Assistants and Nurse Practitioners) who all work together to provide you with the care you need, when you need it.  Your next appointment:   As scheduled  We recommend signing up for the patient portal called "MyChart".  Sign up information is provided on this After Visit Summary.  MyChart is used to connect with patients for Virtual Visits (Telemedicine).  Patients are able to view lab/test results, encounter notes, upcoming appointments, etc.  Non-urgent messages can be sent to your provider as well.   To learn more about what you can do with MyChart, go to [email or web address].     1st Floor: - Lobby - Registration  - Pharmacy  - Lab - Cafe  2nd Floor: - PV Lab - Diagnostic Testing (echo, CT, nuclear med)  3rd Floor: - Vacant  4th Floor: - TCTS (cardiothoracic surgery) - AFib Clinic - Structural Heart Clinic - Vascular Surgery  - Vascular Ultrasound  5th Floor: - HeartCare Cardiology (general and EP) - Clinical Pharmacy for coumadin, hypertension, lipid,  weight-loss medications, and med management appointments    Valet parking services will be available as well.

## 2024-03-01 ENCOUNTER — Telehealth: Payer: Self-pay | Admitting: Internal Medicine

## 2024-03-01 NOTE — Telephone Encounter (Signed)
Attempted to contact patient. No answer, left message to call back

## 2024-03-01 NOTE — Telephone Encounter (Signed)
 Patient stating that she getting pain from where her device is, in her chest. Please advise

## 2024-03-02 NOTE — Telephone Encounter (Signed)
 Outreach made to Pt.  Left message requesting call back.  Pt has an ICD and barostim.  Need further clarification.

## 2024-03-06 NOTE — Progress Notes (Incomplete)
 ***In Progress***    [facility] Note   PCP: Hadley Pen, MD Cardiology: Dr. Servando Salina HF Cardiology: Dr. Shirlee Latch   HPI:    8 YOF with history of CAD and ischemic cardiomyopathy and COPD, quit smoking in 2021. She lives in [place], originally from [place] but moved here to be near family.  Patient had PCI to mid/distal LAD in 2020, then had unstable angina in 08/2020 with DES to 70% stenosis proximal LAD. She has had a cardiomyopathy for a number of years, [employer] CRT-D device placed in 2022. Last ECHO in 07/2023 showed EF <20%, moderate LV dilation, mild LVH, RV normal.     LHC/RHC in 10/2023 showed patent LAD stents with distal edge dissection in the apical LAD (no change from prior films), normal filling pressures, CI low at 2.12.    S/p barostim implant 01/2024.   Seen in HF clinic for follow-up 02/09/24. Primary complaint of stabbing R-sided head pain; reported to not feel like migraine. Had some R-sided facial swelling. Reported SOB when walking up steps or walking further distances on flat found. Physical exam showed no signs of edema. Weight at home reported at 194 pounds. Taking all medications. Felt some-what better since barostim implant.  Today he returns to HF clinic for pharmacist medication titration. At last visit with MD ***.   Shortness of breath/dyspnea on exertion? {YES NO:22349}  Orthopnea/PND? {YES NO:22349} Edema? {YES NO:22349} Lightheadedness/dizziness? {YES NO:22349} Daily weights at home? {YES NO:22349} Blood pressure/heart rate monitoring at home? {YES [id number] Following low-sodium/fluid-restricted diet? {YES NO:22349}  HF Medications: Carvedilol 3.125 mg BID Entresto 24/26 mg BID Farxiga 10 mg daily Digoxin 0.0625 mg daily Vericiguat 2.5 mg daily Torsemide (alternating) 40 mg QAM + 20 mg QPM; 20 mg BID ***NO FILLS FOR SPIRO, NOT ON MED LIST*** >>stopped by Neph MD at [facility] d/t 'hypotension + worsening renal fxn'  Has  the patient been experiencing any side effects to the medications prescribed?  {YES NO:22349}  Does the patient have any problems obtaining medications due to transportation or finances?   {YES NO:22349}  Understanding of regimen: {excellent/good/fair/poor:19665} Understanding of indications: {excellent/good/fair/poor:19665} Potential of compliance: {excellent/good/fair/poor:19665} Patient understands to avoid NSAIDs. Patient understands to avoid decongestants.    Pertinent Lab Values: 02/09/24: Serum creatinine 1.66 mg/dL, BUN 17 mg/dL, Potassium 3.9 mmol/L, Sodium 142 mmol/L, digoxin 0.4 ng/mL 09/23/23: BNP 282.3 pg/mL  Vital Signs: Weight: *** (last clinic weight: 89.8 kg) Blood pressure: *** (last BP 100/66 mmHg***)  Heart rate: *** (last HR 81 bpm)  BRIEF A/P: Start spiro 12.5 dy; if low BP, stop vericiguat >lab appt in 1-2 weeks. F/u in 4 weeks w/ me (may need to sched f/u with Lauren  ***If BP imp, OK to cont vericiguat Stop vericiguat; labs today, sched sooner f/u with Lauren to start spiro  Assessment/Plan: 1. Chronic systolic CHF: Ischemic cardiomyopathy.  [employer] CRT-D device.  ECHO in 07/2023 showed EF <20%, moderate LV dilation, mild LVH, RV normal.  This is somewhat worse than priors. LHC/RHC in 10/2023 showed low CI at 2.12, normal filling pressures, unchanged coronary anatomy with no severe stenoses. S/p barostim 01/2024. NYHA class III*** symptoms with prominent fatigue, somewhat better since barostim.  She is not volume overloaded*** on exam or device interrogation. - ***Continue torsemide 40 QAM/20 QPM alternating with 20 mg BID.   - With soft BP*** and prominent fatigue***, decrease ***vericiguat to 2.5 mg daily. May need to stop all together.*** - Continue carvedilol 3.125 mg BID.  -  Continue Entresto 24/26 BID. - ***Start spironolactone 12.5 mg QHS. BMET today.*** - Continue Farxiga 10 daily.  - Continue digoxin 0.0625 mg daily. Check dig level.*** 2.   CAD: PCI to mid/distal LAD in 2020 and proximal LAD in 2021. LHC in 10/2023 showed stable coronary anatomy with no severe stenoses. No chest pain. - Continue ranolazine. - Continue aspirin/clopidogrel.  - Continue rosuvastatin + ezetimibe, goal LDL < 55.  3. COPD: History of smoking, quit 2021. Should get eventual PFTs.  4. CKD 3: Follows with nephology at [facility]. - Avoid hypotension, med changes as above. 5. Obesity: Body mass index is 32.95 kg/m. - Continue semaglutide***   Follow up in *** weeks with PharmD and *** months with Dr. Shirlee Latch.  Wilmer Floor, PharmD PGY2 Cardiology Pharmacy Resident

## 2024-03-07 ENCOUNTER — Other Ambulatory Visit (HOSPITAL_COMMUNITY): Payer: Self-pay

## 2024-03-07 ENCOUNTER — Ambulatory Visit (HOSPITAL_COMMUNITY)
Admission: RE | Admit: 2024-03-07 | Discharge: 2024-03-07 | Disposition: A | Discharge status: Home / Self Care | Source: Ambulatory Visit | Attending: Cardiology | Admitting: Cardiology

## 2024-03-07 VITALS — BP 98/66 | HR 76 | Wt 199.8 lb

## 2024-03-07 DIAGNOSIS — J449 Chronic obstructive pulmonary disease, unspecified: Secondary | ICD-10-CM | POA: Insufficient documentation

## 2024-03-07 DIAGNOSIS — I2510 Atherosclerotic heart disease of native coronary artery without angina pectoris: Secondary | ICD-10-CM | POA: Insufficient documentation

## 2024-03-07 DIAGNOSIS — Z955 Presence of coronary angioplasty implant and graft: Secondary | ICD-10-CM | POA: Insufficient documentation

## 2024-03-07 DIAGNOSIS — Z7985 Long-term (current) use of injectable non-insulin antidiabetic drugs: Secondary | ICD-10-CM | POA: Insufficient documentation

## 2024-03-07 DIAGNOSIS — Z87891 Personal history of nicotine dependence: Secondary | ICD-10-CM | POA: Insufficient documentation

## 2024-03-07 DIAGNOSIS — I5022 Chronic systolic (congestive) heart failure: Secondary | ICD-10-CM | POA: Diagnosis present

## 2024-03-07 DIAGNOSIS — N183 Chronic kidney disease, stage 3 unspecified: Secondary | ICD-10-CM | POA: Diagnosis not present

## 2024-03-07 DIAGNOSIS — Z95810 Presence of automatic (implantable) cardiac defibrillator: Secondary | ICD-10-CM | POA: Insufficient documentation

## 2024-03-07 DIAGNOSIS — E669 Obesity, unspecified: Secondary | ICD-10-CM | POA: Insufficient documentation

## 2024-03-07 DIAGNOSIS — Z6832 Body mass index (BMI) 32.0-32.9, adult: Secondary | ICD-10-CM | POA: Insufficient documentation

## 2024-03-07 DIAGNOSIS — Z79899 Other long term (current) drug therapy: Secondary | ICD-10-CM | POA: Insufficient documentation

## 2024-03-07 DIAGNOSIS — I255 Ischemic cardiomyopathy: Secondary | ICD-10-CM | POA: Diagnosis not present

## 2024-03-07 NOTE — Progress Notes (Signed)
 Advanced Heart Failure Clinic Note   PCP: Hadley Pen, MD Cardiology: Dr. Servando Salina HF Cardiology: Dr. Shirlee Latch   HPI:  56 YOF with history of CAD and ischemic cardiomyopathy and COPD, quit smoking in 2021. She lives in [place], originally from [place] but moved here to be near family.  Patient had PCI to mid/distal LAD in 2020, then had unstable angina in 08/2020 with DES to 70% stenosis proximal LAD. She has had a cardiomyopathy for a number of years, [employer] CRT-D device placed in 2022. Last ECHO in 07/2023 showed EF <20%, moderate LV dilation, mild LVH, RV normal.     LHC/RHC in 10/2023 showed patent LAD stents with distal edge dissection in the apical LAD (no change from prior films), normal filling pressures, CI low at 2.12.    S/p barostim implant 01/2024.   Seen in HF clinic for follow-up 02/09/24. Primary complaint of stabbing R-sided head pain; reported to not feel like migraine. Had some R-sided facial swelling. Reported SOB when walking up steps or walking further distances on flat ground. Physical exam showed no signs of edema. Weight at home reported at 194 pounds. Taking all medications. Felt some-what better since barostim implant..   Today she returns to HF clinic for pharmacist medication titration. At last visit with APP, her vericiguat was reduced to 2.5 mg daily due to soft BP and fatigue. Today patient reports feeling well. She states that she feels her energy level has improved since her last Barostim adjustment last week. She reports increased exercise tolerance and is motivated to start attending PT at [facility] and get a membership at the [facility] with her husband. She denies symptoms of SOB at rest, PND, or orthopnea. She has no LEE at visit. No reports of lightheadedness or dizziness. She reports some mild chest pain that resolves with rest. Her appetite is OK and she reports following a low salt diet. She reports no side effects with her medications and  reports no missed doses. She reports her R-facial swelling has not changed recently and is still feeling R-sided tingling/numbness in her face. She reported difficulty with getting approval for the renewal of patient assistance for vericiguat and that she has still not been granted approval; she paid $47/30-day supply most recently when picking up at the pharmacy.   HF Medications: Carvedilol 3.125 mg BID Entresto 24/26 mg BID Farxiga 10 mg daily Digoxin 0.0625 mg daily Vericiguat 2.5 mg daily Torsemide (alternating) 40 mg QAM + 20 mg QPM; 20 mg BID  Has the patient been experiencing any side effects to the medications prescribed?  no  Does the patient have any problems obtaining medications due to transportation or finances?   Has Medicare part D. Entresto copay is $0. As above, was having issues getting vericiguat affordably and PAP was in process.   Understanding of regimen: excellent Understanding of indications: good Potential of compliance: excellent Patient understands to avoid NSAIDs. Patient understands to avoid decongestants.    Pertinent Lab Values: 02/09/24: Serum creatinine 1.66 mg/dL, BUN 17 mg/dL, Potassium 3.9 mmol/L, Sodium 142 mmol/L, digoxin 0.4 ng/mL 09/23/23: BNP 282.3 pg/mL  Vital Signs: Weight: 199.8 lbs (last clinic weight 197.6 lbs) Blood pressure: 98/66 mmHg (last BP 100/66 mmHg)  Heart rate: 76 bpm (last HR 81 bpm)   Assessment/Plan: 1. Chronic systolic CHF: Ischemic cardiomyopathy.  [employer] CRT-D device.  ECHO in 07/2023 showed EF <20%, moderate LV dilation, mild LVH, RV normal.  This is somewhat worse than priors.  LHC/RHC in 10/2023 showed low CI at 2.12, normal filling pressures, unchanged coronary anatomy with no severe stenoses. S/p barostim 01/2024. NYHA class II/III symptoms with prominent fatigue, somewhat better since barostim.  She is not volume overloaded on exam - Continue torsemide 40 QAM/20 QPM alternating with 20 mg BID.   - Given  persistent low BP and fatigue will stop vericiguat. This will hopefully provide enough room to start spironolactone next visit.   - Continue carvedilol 3.125 mg BID.  - Continue Entresto 24/26 mg BID. - Continue Farxiga 10 mg daily.  - Continue digoxin 0.0625 mg daily. - At next visit, consider starting spironolactone 12.5 mg daily permitting BP and renal function 2.  CAD: PCI to mid/distal LAD in 2020 and proximal LAD in 2021. LHC in 10/2023 showed stable coronary anatomy with no severe stenoses. No chest pain. - Continue ranolazine. - Continue aspirin/clopidogrel.  - Continue rosuvastatin + ezetimibe, goal LDL < 55.  3. COPD: History of smoking, quit 2021. Should get eventual PFTs.  4. CKD 3: Follows with nephology at [facility]. - Avoid hypotension, med changes as above. 5. Obesity: Body mass index is 32.95 kg/m. - Continue semaglutide   Follow up in 1 month with Pharmacy Clinic.  Wilmer Floor, PharmD PGY2 Cardiology Pharmacy Resident  Karle Plumber, PharmD, BCPS, [facility], CPP Heart Failure Clinic Pharmacist [phone]

## 2024-03-07 NOTE — Telephone Encounter (Signed)
 LMTCB

## 2024-03-07 NOTE — Patient Instructions (Addendum)
 It was a pleasure seeing you today!  MEDICATIONS: -We are changing your medications today -Stop vericiguat Aileen Pilot) -Call if you have questions about your medications.  NEXT APPOINTMENT: Return to clinic in 1 month with PharmD.  In general, to take care of your heart failure: -Limit your fluid intake to 2 Liters (half-gallon) per day.   -Limit your salt intake to ideally 2-3 grams (2000-3000 mg) per day. -Weigh yourself daily and record, and bring that "weight diary" to your next appointment.  (Weight gain of 2-3 pounds in 1 day typically means fluid weight.) -The medications for your heart are to help your heart and help you live longer.   -Please contact us before stopping any of your heart medications.  Call the clinic at [phone] with questions or to reschedule future appointments.

## 2024-03-09 ENCOUNTER — Ambulatory Visit

## 2024-03-09 DIAGNOSIS — E1142 Type 2 diabetes mellitus with diabetic polyneuropathy: Secondary | ICD-10-CM

## 2024-03-09 DIAGNOSIS — I739 Peripheral vascular disease, unspecified: Secondary | ICD-10-CM

## 2024-03-09 DIAGNOSIS — M2142 Flat foot [pes planus] (acquired), left foot: Secondary | ICD-10-CM

## 2024-03-09 NOTE — Progress Notes (Signed)
 Patient presents to the office today for diabetic shoe and insole measuring.  Patient was measured with brannock device to determine size and width for 1 pair of extra depth shoes for 3 pair of insoles.   Documentation of medical necessity will be sent to patient's treating diabetic doctor to verify and sign.   Patient's diabetic provider: Jeralyn Bennett Pa-C  Shoes and insoles will be ordered at that time and patient will be notified for an appointment for fitting when they arrive.   Shoe size (per patient): 9 Shoe choice:   [id number] / [id number] Shoe size ordered: 9 MD Ppw signed Abn

## 2024-03-11 ENCOUNTER — Telehealth: Payer: Self-pay | Admitting: Cardiology

## 2024-03-11 NOTE — Telephone Encounter (Signed)
 Pt stating she been having some pains near her neck. She feel like she over exerted herself yesterday while making mashed potatoes. I had the pt send a manual transmission with her home remote monitor for the nurse to review. Transmission received 03/11/2024.

## 2024-03-11 NOTE — Telephone Encounter (Signed)
   Pt c/o of Chest Pain: STAT if active CP, including tightness, pressure, jaw pain, radiating pain to shoulder/upper arm/back, CP unrelieved by Nitro. Symptoms reported of SOB, nausea, vomiting, sweating.  1. Are you having CP right now? no    2. Are you experiencing any other symptoms (ex. SOB, nausea, vomiting, sweating)?    3. Is your CP continuous or coming and going? Coming and going   4. Have you taken Nitroglycerin? Not this morning   5. How long have you been experiencing CP? Couple of days     6. If NO CP at time of call then end call with telling Pt to call back or call 911 if Chest pain returns prior to return call from triage team.   Patient states that she was taken off of Verquvo and thinks that she needs to be put back on it

## 2024-03-11 NOTE — Telephone Encounter (Signed)
 Spoke with Pt.  She states she has noticed a "buzzing" in her neck.  Advised Pt that that was how her barostim device worked.  Pt recently saw EP APP and had her outputs increased.  She states it's not painful or bothersome, she is just more aware of it.  Pt also concerned that the right side of her chest hurt after mashing homemade potatoes yesterday. Pt states she can reproduce pain with palpitation.  Advised Pt this sounded musculoskeletal in nature.   She will continue to monitor.  Pt also states she has some itching around her device.  Pt asked if she may apply a cool/warm compress.  Advised this would be fine.  Pt states understanding of follow up with Otilio Saber PA scheduled for next month.  She will discuss concerns further at that time.  Pt going to [facility] for a week for vacation.

## 2024-03-11 NOTE — Telephone Encounter (Signed)
 Patient identification verified by 2 forms. Karen Rail, RN    Called and spoke to patient  Patient states:   -she is having a tightness in her chest, middle of chest   -occurs at rest and with activity   -the chest tightness comes and goes   -last episode of chest tightness occurred this morning at 10:30am   -chest tightness lasts a few minutes   -symptoms on going for a couple of days   -she is having some SOB   -symptoms started after Aileen Pilot was discontinued   Patient denies:   -chest tightness at time of call   -pain radiating to arm Advised patient to present to ED for evaluation  Patient agrees, will present to urgent care  Patient has no questions at this time

## 2024-03-25 ENCOUNTER — Telehealth: Payer: Self-pay | Admitting: Podiatry

## 2024-03-25 NOTE — Telephone Encounter (Signed)
 The form for diabetic shoes was sent to the endocrinologist and they said  needs to be sent to the primary care physician, Dr. Lucrezia Sachs. Phone: [phone].

## 2024-03-30 ENCOUNTER — Other Ambulatory Visit (HOSPITAL_COMMUNITY): Payer: Self-pay

## 2024-04-01 ENCOUNTER — Ambulatory Visit (INDEPENDENT_AMBULATORY_CARE_PROVIDER_SITE_OTHER): Payer: Medicare Other

## 2024-04-01 DIAGNOSIS — I255 Ischemic cardiomyopathy: Secondary | ICD-10-CM

## 2024-04-01 LAB — CUP PACEART REMOTE DEVICE CHECK
Battery Remaining Longevity: 84 mo
Battery Remaining Percentage: 93 %
Brady Statistic RA Percent Paced: 0 %
Brady Statistic RV Percent Paced: 100 %
Date Time Interrogation Session: 20250502031100
HighPow Impedance: 83 Ohm
Implantable Lead Connection Status: 753985
Implantable Lead Connection Status: 753985
Implantable Lead Connection Status: 753985
Implantable Lead Implant Date: 20220131
Implantable Lead Implant Date: 20220131
Implantable Lead Implant Date: 20220131
Implantable Lead Location: 753858
Implantable Lead Location: 753859
Implantable Lead Location: 753860
Implantable Lead Model: 137
Implantable Lead Model: 3830
Implantable Lead Model: 7840
Implantable Lead Serial Number: 1047234
Implantable Lead Serial Number: 301179
Implantable Pulse Generator Implant Date: 20220131
Lead Channel Impedance Value: 515 Ohm
Lead Channel Impedance Value: 519 Ohm
Lead Channel Impedance Value: 746 Ohm
Lead Channel Setting Pacing Amplitude: 0.1 V
Lead Channel Setting Pacing Amplitude: 3.5 V
Lead Channel Setting Pacing Amplitude: 3.5 V
Lead Channel Setting Pacing Pulse Width: 0.1 ms
Lead Channel Setting Pacing Pulse Width: 0.4 ms
Lead Channel Setting Sensing Sensitivity: 0.5 mV
Lead Channel Setting Sensing Sensitivity: 1 mV
Pulse Gen Serial Number: 149649
Zone Setting Status: 755011

## 2024-04-03 ENCOUNTER — Encounter: Payer: Self-pay | Admitting: Internal Medicine

## 2024-04-04 ENCOUNTER — Ambulatory Visit (INDEPENDENT_AMBULATORY_CARE_PROVIDER_SITE_OTHER): Payer: Medicare Other | Admitting: Podiatry

## 2024-04-04 DIAGNOSIS — M79675 Pain in left toe(s): Secondary | ICD-10-CM

## 2024-04-04 DIAGNOSIS — B351 Tinea unguium: Secondary | ICD-10-CM | POA: Diagnosis not present

## 2024-04-04 DIAGNOSIS — M79674 Pain in right toe(s): Secondary | ICD-10-CM | POA: Diagnosis not present

## 2024-04-04 DIAGNOSIS — E1142 Type 2 diabetes mellitus with diabetic polyneuropathy: Secondary | ICD-10-CM

## 2024-04-04 DIAGNOSIS — I739 Peripheral vascular disease, unspecified: Secondary | ICD-10-CM

## 2024-04-04 NOTE — Progress Notes (Unsigned)
  Subjective:  Patient ID: Karen Warner, female    DOB: [birth year],  MRN: [id number]  Chief Complaint  Patient presents with   [facility]    [facility] with out callous. Last A1c was 7.0 a month ago and takes plavix .     73 y.o. female presents with the above complaint. History confirmed with patient. Patient presenting with pain related to dystrophic thickened elongated nails. Patient is unable to trim own nails related to nail dystrophy and/or mobility issues. Patient does have a history of T2DM.  Last A1c about 7.   Objective:  Physical Exam: warm, capillary refill 3 to 5 seconds, diminished pedal hair growth, pedal skin atrophic, purpuric lesions noted to posterior heel. nail exam onychomycosis of the toenails, onycholysis, and dystrophic nails DP pulses faintly palpable, PT pulses nonpalpable, and protective sensation absent Left Foot:  Pain with palpation of nails due to elongation and dystrophic growth.  Right Foot: Pain with palpation of nails due to elongation and dystrophic growth.   Assessment:   1. Diabetic polyneuropathy associated with type 2 diabetes mellitus (HCC)   2. PVD (peripheral vascular disease) (HCC)   3. Pain due to onychomycosis of toenails of both feet      Plan:  Patient was evaluated and treated and all questions answered.  #Onychomycosis with pain  -Nails palliatively debrided as below. -Educated on self-care  Procedure: Nail Debridement Rationale: Pain Type of Debridement: manual, sharp debridement. Instrumentation: Nail nipper, rotary burr. Number of Nails: 10  # Diabetes with neuropathy # PVD -Patient educated on diabetes. Discussed proper diabetic foot care and discussed risks and complications of disease. Educated patient in depth on reasons to return to the office immediately should he/she discover anything concerning or new on the feet. All questions answered. Discussed proper shoes as well.  -We did discuss PVD findings.  Currently she is  not noticing claudication symptoms, rest pain or tissue loss.  Discussed that we can order noninvasive vascular studies and place referral if she develops these and what signs and symptoms she needs to look out for.  Return in about 3 months (around 07/05/2024) for Diabetic Foot Care.         Eve Hinders, DPM [facility]

## 2024-04-05 ENCOUNTER — Encounter: Payer: Self-pay | Admitting: Podiatry

## 2024-04-06 NOTE — Progress Notes (Unsigned)
  Cardiology Office Note:   Date:  04/06/2024  ID:  Karen Warner, DOB July 13, 1951, MRN [id number]  Primary Cardiologist: Jerryl Morin, DO Electrophysiologist: Manya Sells, MD   History of Present Illness:   Karen Warner is a 73 y.o. female with h/o chronic systolic CHF, ICM, CAD, HTN, HLD, COPD, OSA not on CPAP, DM2, and GERD seen today for routine electrophysiology followup.   At last visit, Device titrated from 4.0 millamp to 5.6 milliamp by increments of 0.4 with good blood pressure response.  Pt again had stim described as a "burning" sensation.  Since last being seen in our clinic she reports doing about the same. Started PT and moving around a little better. Cooking more and having less fatigue. Still having some HA and stiffness in her neck, chronic component.  Has occasional stim when doing neck PT, but not with ADLs. Took BP meds on an empty stomach and has not eaten yet today. Denies dizziness or lightheadedness.   Review of systems complete and found to be negative unless listed in HPI.    EP Information / Studies Reviewed:    EKG is not ordered today. EKG from 01/12/2024 previously reviewed.   Arrhythmia/Device History [employer] BiV ICD implanted 12/31/2020 for LBBB, HFrEF/ICM Barostim implanted 01/13/2024   Barostim Interrogation- Performed personally and reviewed in detail today,  See scanned report  ICD/PPM interrogation - Not performed today. See last PaceArt report    Physical Exam:   VS:  There were no vitals taken for this visit.   Wt Readings from Last 3 Encounters:  03/07/24 199 lb 12.8 oz (90.6 kg)  02/26/24 200 lb (90.7 kg)  02/09/24 198 lb (89.8 kg)     GEN: Well nourished, well developed in no acute distress NECK: No JVD; No carotid bruits CARDIAC: Regular rate and rhythm, no murmurs, rubs, gallops RESPIRATORY:  Clear to auscultation without rales, wheezing or rhonchi  ABDOMEN: Soft, non-tender, non-distended EXTREMITIES:   No edema; No deformity    ASSESSMENT AND PLAN:    Chronic systolic CHF s/p Chief Financial Officer and Barostim implantation NYHA II-III symptoms.   Device left at 5.6 millamp with soft pressure and continued stiffness/aching in neck intermittently.  Device impedence stable. Pt goals are to continue to improve her functional status and tolerance for cooking and visiting with her family. Normal device function  See scanned report.  Migraines HA Chronic component.   Do not think this is directly from device THERAPY.  Recommended PCP follow up and continued monitoring.    Disposition:   Follow up with EP APP in 2 months  Signed, Tylene Galla, PA-C

## 2024-04-07 ENCOUNTER — Encounter: Payer: Self-pay | Admitting: Student

## 2024-04-07 ENCOUNTER — Ambulatory Visit: Attending: Student | Admitting: Student

## 2024-04-07 VITALS — BP 80/58 | HR 80 | Ht 65.0 in | Wt 194.0 lb

## 2024-04-07 DIAGNOSIS — I2510 Atherosclerotic heart disease of native coronary artery without angina pectoris: Secondary | ICD-10-CM | POA: Diagnosis not present

## 2024-04-07 DIAGNOSIS — I5022 Chronic systolic (congestive) heart failure: Secondary | ICD-10-CM

## 2024-04-07 DIAGNOSIS — J449 Chronic obstructive pulmonary disease, unspecified: Secondary | ICD-10-CM

## 2024-04-07 DIAGNOSIS — I255 Ischemic cardiomyopathy: Secondary | ICD-10-CM | POA: Diagnosis not present

## 2024-04-07 LAB — CUP PACEART INCLINIC DEVICE CHECK
Date Time Interrogation Session: 20250508124421
Implantable Lead Connection Status: 753985
Implantable Lead Connection Status: 753985
Implantable Lead Connection Status: 753985
Implantable Lead Implant Date: 20220131
Implantable Lead Implant Date: 20220131
Implantable Lead Implant Date: 20220131
Implantable Lead Location: 753858
Implantable Lead Location: 753859
Implantable Lead Location: 753860
Implantable Lead Model: 137
Implantable Lead Model: 3830
Implantable Lead Model: 7840
Implantable Lead Serial Number: 1047234
Implantable Lead Serial Number: 301179
Implantable Pulse Generator Implant Date: 20220131
Pulse Gen Serial Number: 149649

## 2024-04-07 NOTE — Patient Instructions (Signed)
 Medication Instructions:  Your physician recommends that you continue on your current medications as directed. Please refer to the Current Medication list given to you today.  *If you need a refill on your cardiac medications before your next appointment, please call your pharmacy*  Lab Work: None ordered If you have labs (blood work) drawn today and your tests are completely normal, you will receive your results only by: MyChart Message (if you have MyChart) OR A paper copy in the mail If you have any lab test that is abnormal or we need to change your treatment, we will call you to review the results.  Follow-Up: At [facility], you and your health needs are our priority.  As part of our continuing mission to provide you with exceptional heart care, our providers are all part of one team.  This team includes your primary Cardiologist (physician) and Advanced Practice Providers or APPs (Physician Assistants and Nurse Practitioners) who all work together to provide you with the care you need, when you need it.  Your next appointment:   As scheduled

## 2024-04-08 ENCOUNTER — Other Ambulatory Visit: Payer: Self-pay | Admitting: Cardiology

## 2024-04-09 NOTE — Progress Notes (Incomplete)
 ***In Progress***    Advanced Heart Failure Clinic Note   PCP: Melva Stabile, MD Cardiology: Dr. Emmette Harms HF Cardiology: Dr. Mitzie Anda    HPI:  32 YOF with history of CAD and ischemic cardiomyopathy and COPD, quit smoking in 2021. She lives in [place], originally from [place]  but moved here to be near family.   Patient had PCI to mid/distal LAD in 2020, then had unstable angina in 08/2020 with DES to 70% stenosis proximal LAD. She has had a cardiomyopathy for a number of years, [employer] CRT-D device placed in 2022. Last ECHO in 07/2023 showed EF <20%, moderate LV dilation, mild LVH, RV normal.     LHC/RHC in 10/2023 showed patent LAD stents with distal edge dissection in the apical LAD (no change from prior films), normal filling pressures, CI low at 2.12.    S/p barostim implant 01/2024.   Seen in HF clinic for follow-up 02/09/24. Primary complaint of stabbing R-sided head pain; reported to not feel like migraine. Had some R-sided facial swelling. Reported SOB when walking up steps or walking further distances on flat ground. Physical exam showed no signs of edema. Weight at home reported at 194 pounds. Taking all medications. Felt some-what better since barostim implant.. Vericiguat  was reduced to 2.5 mg daily with low blood pressures and persistent fatigue.   At follow-up in the HF clinic on 03/07/2024, the patient reported improved energy levels after her last Barostim adjustment. She noted that her fatigue had improved but was not completely resolved. She expressed motivation to resume exercise and attend [facility] and the [employer]. There were no reports of shortness of breath or DOE. Her blood pressure in the clinic remained low, leading to the discontinuation of vericiguat .   Today she returns to HF clinic for pharmacist medication titration. ***   Shortness of breath/dyspnea on exertion? {YES NO:22349}  Orthopnea/PND? {YES NO:22349} Edema? {YES  NO:22349} Lightheadedness/dizziness? {YES NO:22349} Daily weights at home? {YES NO:22349} Blood pressure/heart rate monitoring at home? {YES [id number] Following low-sodium/fluid-restricted diet? {YES NO:22349}  HF Medications: Carvedilol  3.125 mg BID Entresto  24/26 mg BID Farxiga  10 mg daily Digoxin  0.0625 mg daily Torsemide  (alternating) 40 mg QAM + 20 mg QPM; 20 mg BID   Has the patient been experiencing any side effects to the medications prescribed?  {YES NO:22349}  Does the patient have any problems obtaining medications due to transportation or finances?  Medicare Part D ***  Understanding of regimen: {excellent/good/fair/poor:19665} Understanding of indications: {excellent/good/fair/poor:19665} Potential of compliance: {excellent/good/fair/poor:19665} Patient understands to avoid NSAIDs. Patient understands to avoid decongestants.    Pertinent Lab Values: 04/09/24: Serum creatinine 1.34 mg/dL, BUN 17 mg/dL, Potassium 3.9 mmol/L, Sodium 142 mmol/L 02/09/24: Digoxin  0.4 ng/mL   Vital Signs: Weight: *** (last clinic weight: 199 lbs) Blood pressure: *** (last BP: 98/66 mmHg) Heart rate: *** (last HR: 76 bpm)  Brief A/P: ***no labs if BP improved, start spiro 12.5 dy >>labs 2 weeks, Dalton wants to see June 2025, not scheduled, pt needs to call 2. If BP soft >no changed >same MD f/u plan above  Assessment/Plan: 1. Chronic systolic CHF: Ischemic cardiomyopathy.  [employer] CRT-D device.  ECHO in 07/2023 showed EF <20%, moderate LV dilation, mild LVH, RV normal. This was somewhat worse than priors. LHC/RHC in 10/2023 showed low CI at 2.12, normal filling pressures, unchanged coronary anatomy with no severe stenoses. S/p barostim 01/2024.  ***NYHA class II/III symptoms with ***prominent fatigue, improved since barostim. ***She is not volume overloaded on exam -  Continue torsemide  40 QAM/20 QPM alternating with 20 mg BID.   - Given persistent low BP and fatigue will stop  vericiguat . This will hopefully provide enough room to start spironolactone  next visit.   - Continue carvedilol  3.125 mg BID.  - Continue Entresto  24/26 mg BID. - Continue Farxiga  10 mg daily.  - Continue digoxin  0.0625 mg daily. - ***At next visit, consider starting spironolactone  12.5 mg daily permitting BP and renal function 2.  CAD: PCI to mid/distal LAD in 2020 and proximal LAD in 2021. LHC in 10/2023 showed stable coronary anatomy with no severe stenoses. No chest pain. - Continue ranolazine . - Continue aspirin /clopidogrel .  - Continue rosuvastatin  + ezetimibe , goal LDL <55 mg/dL.  3. COPD: History of smoking, quit 2021. Should get eventual PFTs.  4. CKD 3: Follows with nephology at [facility]. - Avoid hypotension, med changes as above. 5. Obesity: Body mass index is 32.95 kg/m. - Continue semaglutide   Follow up in 1 month with MD***.   Albino Alu, PharmD PGY2 Cardiology Pharmacy Resident   Luster Salters, PharmD, BCPS, [facility], CPP Heart Failure Clinic Pharmacist [phone]

## 2024-04-11 ENCOUNTER — Ambulatory Visit (HOSPITAL_COMMUNITY)
Admission: RE | Admit: 2024-04-11 | Discharge: 2024-04-11 | Disposition: A | Discharge status: Home / Self Care | Source: Ambulatory Visit | Attending: Internal Medicine | Admitting: Internal Medicine

## 2024-04-11 VITALS — BP 94/62 | HR 76 | Wt 195.8 lb

## 2024-04-11 DIAGNOSIS — I959 Hypotension, unspecified: Secondary | ICD-10-CM | POA: Diagnosis not present

## 2024-04-11 DIAGNOSIS — Z6832 Body mass index (BMI) 32.0-32.9, adult: Secondary | ICD-10-CM | POA: Diagnosis not present

## 2024-04-11 DIAGNOSIS — J449 Chronic obstructive pulmonary disease, unspecified: Secondary | ICD-10-CM | POA: Diagnosis not present

## 2024-04-11 DIAGNOSIS — I255 Ischemic cardiomyopathy: Secondary | ICD-10-CM | POA: Insufficient documentation

## 2024-04-11 DIAGNOSIS — Z955 Presence of coronary angioplasty implant and graft: Secondary | ICD-10-CM | POA: Diagnosis not present

## 2024-04-11 DIAGNOSIS — Z7984 Long term (current) use of oral hypoglycemic drugs: Secondary | ICD-10-CM | POA: Diagnosis not present

## 2024-04-11 DIAGNOSIS — I2510 Atherosclerotic heart disease of native coronary artery without angina pectoris: Secondary | ICD-10-CM | POA: Diagnosis not present

## 2024-04-11 DIAGNOSIS — Z79899 Other long term (current) drug therapy: Secondary | ICD-10-CM | POA: Diagnosis not present

## 2024-04-11 DIAGNOSIS — I130 Hypertensive heart and chronic kidney disease with heart failure and stage 1 through stage 4 chronic kidney disease, or unspecified chronic kidney disease: Secondary | ICD-10-CM | POA: Diagnosis not present

## 2024-04-11 DIAGNOSIS — I5022 Chronic systolic (congestive) heart failure: Secondary | ICD-10-CM | POA: Insufficient documentation

## 2024-04-11 DIAGNOSIS — E669 Obesity, unspecified: Secondary | ICD-10-CM | POA: Insufficient documentation

## 2024-04-11 DIAGNOSIS — N183 Chronic kidney disease, stage 3 unspecified: Secondary | ICD-10-CM | POA: Insufficient documentation

## 2024-04-11 DIAGNOSIS — Z87891 Personal history of nicotine dependence: Secondary | ICD-10-CM | POA: Insufficient documentation

## 2024-04-11 MED ORDER — MICONAZOLE NITRATE 2 % VA CREA: 1.0000 | TOPICAL_CREAM | Freq: Every day | VAGINAL | 0 refills | Status: AC

## 2024-04-11 NOTE — Progress Notes (Addendum)
 Advanced Heart Failure Clinic Note   PCP: Melva Stabile, MD Cardiology: Dr. Emmette Harms HF Cardiology: Dr. Mitzie Anda    HPI:  47 YOF with history of CAD and ischemic cardiomyopathy and COPD, quit smoking in 2021. She lives in [place], originally from [place]  but moved here to be near family.   Patient had PCI to mid/distal LAD in 2020, then had unstable angina in 08/2020 with DES to 70% stenosis proximal LAD. She has had a cardiomyopathy for a number of years, [employer] CRT-D device placed in 2022. Last ECHO in 07/2023 showed EF <20%, moderate LV dilation, mild LVH, RV normal.     LHC/RHC in 10/2023 showed patent LAD stents with distal edge dissection in the apical LAD (no change from prior films), normal filling pressures, CI low at 2.12.    S/p barostim implant 01/2024.   Seen in HF clinic for follow-up 02/09/24. Primary complaint of stabbing R-sided head pain; reported to not feel like migraine. Had some R-sided facial swelling. Reported SOB when walking up steps or walking further distances on flat ground. Physical exam showed no signs of edema. Weight at home reported at 194 pounds. Taking all medications. Felt some-what better since barostim implant.. Vericiguat  was reduced to 2.5 mg daily with low blood pressures and persistent fatigue.   At follow-up in the HF clinic on 03/07/2024, the patient reported improved energy levels after her last Barostim adjustment. She noted that her fatigue had improved but was not completely resolved. She expressed motivation to resume exercise and attend [employer] and the [employer]. There were no reports of shortness of breath or DOE. Her blood pressure in the clinic remained low, leading to the discontinuation of vericiguat .  Today she returns to HF clinic for pharmacist medication titration. Since her last visit, the patient called the clinic reporting intermittent tightness in her chest since stopping vericiguat , with episodes lasting only a few  minutes. No changes were made to her medications at that time. The patient also visited her PCP with complaints of low BP at home (~80/60 mmHg) and vaginal itching for a few days. Her BP was 90/58 mmHg in clinic, and her PCP instructed her to hold carvedilol  and follow up with cardiology regarding when to resume. PCP also prescribed fluconazole for treatment of a suspected yeast infection. Today she reports feeling well overall and denies SOB or DOE during PT classes. Her weight at home has been stable around 186 lbs. She has trace LEE, unchanged from the last visit, and denies PND or orthopnea. She reports no improvement in her home BP (still 80-90/60 mmHg) since holding carvedilol  and continues to feel mildly fatigued at rest, with no change in symptoms. She reports two types of chest pain: one is dermal at the [facility] site, and the other feels like "a small version of a heart attack." She states that nothing consistently relieves or exacerbates this pain, but it always resolves eventually. She states that she doesn't actually think vericiguat  was relieving her chest pain. Additionally, she describes occasional musculoskeletal chest pain during PT that feels more like muscle soreness. Her vaginal itching remains bothersome today in clinic despite taking fluconazole. She notes a history of frequent yeast infections over the past several years and was unaware that Farxiga  could be a potential cause.   HF Medications: Carvedilol  3.125 mg BID Entresto  24/26 mg BID Farxiga  10 mg daily Digoxin  0.0625 mg daily Torsemide  (alternating) 40 mg QAM + 20 mg QPM; 20 mg BID  Has the patient been experiencing any side effects to the medications prescribed?  Yes - hypotension with Entresto  and carvedilol ; yeast infections that Farxiga  could be contributing to but the patient has been on the medication for several years. Continue to monitor.  Does the patient have any problems obtaining medications due to  transportation or finances?  Medicare Part D   Understanding of regimen: fair Understanding of indications: fair Potential of compliance: fair Patient understands to avoid NSAIDs. Patient understands to avoid decongestants.    Pertinent Lab Values: 04/09/24: Serum creatinine 1.34 mg/dL, BUN 17 mg/dL, Potassium 3.9 mmol/L, Sodium 142 mmol/L 02/09/24: Digoxin  0.4 ng/mL   Vital Signs: Weight: 195.8 lbs (last clinic weight: 199 lbs) Blood pressure: 94/62 mmHg (last BP: 98/66 mmHg) Heart rate: 76 bpm (last HR: 76 bpm)   Assessment/Plan: 1. Chronic systolic CHF: Ischemic cardiomyopathy.  [employer] CRT-D device.  ECHO in 07/2023 showed EF <20%, moderate LV dilation, mild LVH, RV normal. This was somewhat worse than priors. LHC/RHC in 10/2023 showed low CI at 2.12, normal filling pressures, unchanged coronary anatomy with no severe stenoses. S/p barostim 01/2024.  NYHA class II/III symptoms with prominent fatigue, improved slightly since barostim. She has trace bilateral LEE and is not volume overloaded on exam - Continue torsemide  40 QAM/20 QPM alternating with 20 mg BID.   - Resume carvedilol  3.125 mg BID.  - Hold Entresto  given persistently low BP. Look to start low-dose ARB at follow-up in 3 weeks if BP has improved. Could also resume spironolactone  if BP is improved but still too low for ARB initiation.  - Continue Farxiga  10 mg daily. Start OTC topical miconazole cream and monitor symptoms of yeast infection. If not improved at follow-up in 3 weeks, would discontinue therapy as this has been a chronic problem for her. Be mindful of volume status if stopping SGLT2i, may need to adjust torsemide  therapy. - Continue digoxin  0.0625 mg daily. 2.  CAD: PCI to mid/distal LAD in 2020 and proximal LAD in 2021. LHC in 10/2023 showed stable coronary anatomy with no severe stenoses. Intermittent chest pain that is bothersome at times. Patient also having pain at [facility] site and musculoskeletal  chest pain from PT. - Continue ranolazine . - Continue aspirin /clopidogrel .  - Continue rosuvastatin  + ezetimibe , goal LDL <55 mg/dL.  3. COPD: History of smoking, quit 2021. 4. CKD 3: Follows with nephology at [facility]. - Avoid hypotension, med changes as above. 5. Obesity: Body mass index is 32.95 kg/m. - Continue semaglutide   Follow up in 3 weeks with APP in HF Clinic.   Albino Alu, PharmD PGY2 Cardiology Pharmacy Resident   Luster Salters, PharmD, BCPS, [facility], CPP Heart Failure Clinic Pharmacist [phone]

## 2024-04-11 NOTE — Patient Instructions (Signed)
 It was a pleasure seeing you today!  MEDICATIONS: -We are changing your medications today -Start carvedilol  3.125 mg twice daily -Hold Entresto  for now -Start miconazole topical cream and continue for 7 days -Call if you have questions about your medications.  LABS: -We will call you if your labs need attention.  NEXT APPOINTMENT: Return to clinic in 3 weeks with Heart Failure APP.  In general, to take care of your heart failure: -Limit your fluid intake to 2 Liters (half-gallon) per day.   -Limit your salt intake to ideally 2-3 grams (2000-3000 mg) per day. -Weigh yourself daily and record, and bring that "weight diary" to your next appointment.  (Weight gain of 2-3 pounds in 1 day typically means fluid weight.) -The medications for your heart are to help your heart and help you live longer.   -Please contact us  before stopping any of your heart medications.  Call the clinic at [phone] with questions or to reschedule future appointments.

## 2024-05-03 NOTE — Progress Notes (Signed)
 PCP: Melva Stabile, MD Cardiology: Dr. Emmette Harms HF Cardiology: Dr. Mitzie Anda   73 y.o. with history of CAD and ischemic cardiomyopathy and COPD. Patient had PCI to mid/distal LAD in 2020, then had unstable angina in 9/21 with DES to 70% stenosis proximal LAD.  She has had a cardiomyopathy for a number of years, [employer] CRT-D device placed in 2022.  Last echo in 8/24 showed EF < 20%, moderate LV dilation, mild LVH, RV normal.  She additionally has a diagnosis of COPD, quit smoking in 2021. She lives in [place], originally from [place]  but moved here to be near family.   LHC/RHC in 11/24 showed patent LAD stents with distal edge dissection in the apical LAD (no change from prior films), normal filling pressures, CI low at 2.12.   S/p barostim implant 2/25.  Today she returns for HF follow up. Overall feeling fine. She is SOB walking  short distances on flat ground. Does OK with ADLs. Feels palpitations occasionally, had episode last week where she became sweaty and took a nitroglycerin  with asa. Swelling in hands. Denies abnormal bleeding, CP, dizziness, or PND/Orthopnea. Appetite ok. Weight at home 191 pounds. Taking all medications. BP at home 120-140/70s.  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   Labs (7/24): K 4, creatinine 1.45 Labs (10/24): K 4.1, creatinine 1.6, LDL 74 Labs (12/24): LDL 41 Labs (2/25): K 3.5, creatinine 1.30 Labs (5/25): K 3.9, creatinine 1.24, LDL 48  ECG (personally reviewed):  barostim artifact, V-paced  PMH: 1. Fe deficiency anemia 2. OSA: Does not tolerate CPAP.  3. HTN 4. IBS 5. COPD: Quit smoking in 2021.  6. Depression  7. Type 2 diabetes 8. CAD: PCI mid-distal LAD in 2020.  - LHC (9/21): Unstable angina, 70% proximal LAD treated with DES.  - LHC (11/24): Patent LAD stents with distal edge dissection in the apical LAD (no change from prior films) 9. Chronic systolic CHF:  Suspect ischemic cardiomyopathy.  [employer] CRT-D device.  - Echo (8/22): EF 25-30% - Echo (3/23): EF 30% - Echo (8/24): EF < 20%, moderate LV dilation, mild LVH, RV normal - RHC (11/24): mean RA 3, PA 35/9, mean PCWP 9, CI 2.12, PAPi 8.7 - s/p  barostim 2/25 10. CKD stage 3  FH: No sudden death or cardiomyopathy  Social History   Socioeconomic History   Marital status: Significant Other    Spouse name: Not on file   Number of children: 3   Years of education: Not on file   Highest education level: Not on file  Occupational History   Occupation: retired  Tobacco Use   Smoking status: Former    Current packs/day: 0.00    Types: Cigarettes    Quit date: 2021    Years since quitting: 4.4   Smokeless tobacco: Never  Vaping Use   Vaping status: Never Used  Substance and Sexual Activity   Alcohol use: Never   Drug use: Never   Sexual activity: Not on file  Other Topics Concern   Not on file  Social History Narrative   Not on file   Social Drivers of Health   Financial Resource Strain: Not on file  Food Insecurity: Low Risk  (10/20/2023)   Received from [facility]   Hunger Vital Sign    Worried About Running Out of Food in the Last Year: Never true    Ran Out of Food in the Last Year: Never true  Transportation  Needs: No Transportation Needs (10/20/2023)   Received from [facility]    In the past 12 months, has lack of reliable transportation kept you from medical appointments, meetings, work or from getting things needed for daily living? : No  Physical Activity: Not on file  Stress: Not on file  Social Connections: Not on file  Intimate Partner Violence: Not on file   ROS: All systems reviewed and negative except as per HPI.   Current Outpatient Medications  Medication Sig Dispense Refill   acetaminophen  (TYLENOL ) 650 MG CR tablet Take 650 mg by mouth every 8 (eight) hours as needed for pain.     albuterol  (PROVENTIL ) (2.5 MG/3ML)  0.083% nebulizer solution Take 2.5 mg by nebulization every 4 (four) hours as needed for wheezing or shortness of breath.     aspirin  81 MG EC tablet Take 81 mg by mouth daily.     baclofen (LIORESAL) 10 MG tablet Take 10 mg by mouth 3 (three) times daily.     BD PEN NEEDLE NANO 2ND GEN 32G X 4 MM MISC 3 (three) times daily.     carvedilol  (COREG ) 3.125 MG tablet Take 1 tablet (3.125 mg total) by mouth 2 (two) times daily with a meal. 180 tablet 2   clopidogrel  (PLAVIX ) 75 MG tablet TAKE 1 TABLET(75 MG) BY MOUTH DAILY 90 tablet 0   Continuous Glucose Sensor (DEXCOM G7 SENSOR) MISC SMARTSIG:1 Topical Every 10 Days     dapagliflozin  propanediol (FARXIGA ) 10 MG TABS tablet TAKE 1 TABLET(10 MG) BY MOUTH DAILY BEFORE BREAKFAST 90 tablet 3   dicyclomine  (BENTYL ) 10 MG capsule TAKE 1 CAPSULE(10 MG) BY MOUTH IN THE MORNING AND AT BEDTIME (Patient taking differently: Take 10 mg by mouth 2 (two) times daily as needed for spasms.) 180 capsule 1   digoxin  (LANOXIN ) 0.125 MG tablet TAKE 1/2 TABLET(0.063 MG) BY MOUTH DAILY 15 tablet 3   ezetimibe  (ZETIA ) 10 MG tablet Take 1 tablet (10 mg total) by mouth daily. 30 tablet 6   famotidine  (PEPCID ) 40 MG tablet TAKE 1 TABLET(40 MG) BY MOUTH TWICE DAILY 60 tablet 2   folic acid  (FOLVITE ) 1 MG tablet Take 1 mg by mouth daily.     insulin  lispro (HUMALOG) 100 UNIT/ML KwikPen Inject 2-20 Units into the skin 3 (three) times daily with meals.  per sliding scale     lidocaine  (LIDODERM ) 5 % Place 1 patch onto the skin daily. Remove & Discard patch within 12 hours or as directed by MD (Patient taking differently: Place 1 patch onto the skin daily as needed (Pain). Remove & Discard patch within 12 hours or as directed by MD) 5 patch 0   loratadine (CLARITIN) 10 MG tablet Take 10 mg by mouth daily.     nitroGLYCERIN  (NITROSTAT ) 0.4 MG SL tablet DISSOLVE 1 TABLET UNDER TONGUE EVERY 5 MINUTES AS NEEDED FOR CHEST PAIN. 25 tablet 3   OZEMPIC, 0.25 OR 0.5 MG/DOSE, 2 MG/3ML SOPN  Inject 0.5 mg into the skin every Monday.     pantoprazole  (PROTONIX ) 40 MG tablet TAKE 1 TABLET(40 MG) BY MOUTH DAILY 90 tablet 0   Plecanatide  (TRULANCE ) 3 MG TABS Take 1 tablet (3 mg total) by mouth daily. (Patient taking differently: Take 3 mg by mouth daily as needed (constipation).) 30 tablet 2   pregabalin  (LYRICA ) 50 MG capsule Take 50 mg by mouth 2 (two) times daily.     ranolazine  (RANEXA ) 500 MG 12 hr tablet Take 1 tablet (500 mg  total) by mouth 2 (two) times daily. 180 tablet 3   rosuvastatin  (CRESTOR ) 40 MG tablet Take 40 mg by mouth at bedtime.     torsemide  (DEMADEX ) 20 MG tablet 40 mg in the morning and 20 mg in the evening, alternating with 20 mg Twice daily 180 tablet 3   TRESIBA FLEXTOUCH 200 UNIT/ML FlexTouch Pen Inject 35 Units into the skin 2 (two) times daily.     TRINTELLIX  5 MG TABS tablet Take 5 mg by mouth at bedtime.     Ubrogepant 50 MG TABS Take 50 mg by mouth daily as needed (Headache).     valACYclovir (VALTREX) 500 MG tablet Take 500 mg by mouth daily.     vitamin B-12 (CYANOCOBALAMIN ) 1000 MCG tablet Take 1,000 mcg by mouth daily.     Vitamin D, Ergocalciferol, (DRISDOL) 1.25 MG (50000 UNIT) CAPS capsule Take 50,000 Units by mouth every 7 (seven) days.     No current facility-administered medications for this encounter.   Wt Readings from Last 3 Encounters:  05/04/24 88 kg (194 lb)  04/11/24 88.8 kg (195 lb 12.8 oz)  04/07/24 88 kg (194 lb)   BP 124/82   Pulse 78   Wt 88 kg (194 lb)   SpO2 99%   BMI 32.28 kg/m  Physical Exam General:  NAD. No resp difficulty, walked into clinic, elderly HEENT: Normal Neck: Supple. No JVD. Cor: Regular rate & rhythm. No rubs, gallops or murmurs. Lungs: Clear Abdomen: Soft, nontender, nondistended.  Extremities: No cyanosis, clubbing, rash, edema Neuro: Alert & oriented x 3, moves all 4 extremities w/o difficulty. Affect pleasant.  Assessment/Plan: 1. Chronic systolic CHF: Ischemic cardiomyopathy.  [employer] CRT-D device.  Echo in 8/24 showed EF < 20%, moderate LV dilation, mild LVH, RV normal.  This is somewhat worse than priors. LHC/RHC in 11/24 showed low CI at 2.12, normal filling pressures, unchanged coronary anatomy with no severe stenoses. S/p barostim 2/25. NYHA class III symptoms with prominent fatigue, somewhat better since barostim.  She is not volume overloaded on exam. GDMT has been limited by hypotension. - Off Entresto , spiro and verquvo  due to hypotension. - Start losartan 12.5 mg daily. BMET in 2 weeks. - Continue torsemide  40 qam/20 qpm alternating with 20 mg bid.  Labs reviewed from [facility] on 04/08/24 and are stable - Continue Coreg  3.125 mg bid.  - Continue dapagliflozin  10 mg daily.  - Continue digoxin  0.0625 mg daily. Dig level stable 0.4 on 02/09/24 - She has been referred to cardiac rehab at the hospital in [place].  2.  CAD: PCI to mid/distal LAD in 2020 and proximal LAD in 2021.  LHC in 11/24 showed stable coronary anatomy with no severe stenoses. No chest pain. - Continue ranolazine  500 mg bid. - Continue ASA/Plavix .  - Continue Crestor  + Zetia , goal LDL < 55. Lipid panel from 04/08/24 reviewed from [facility], LDL 48 3. COPD: History of smoking, quit 2021.  Should get eventual PFTs.  4. CKD 3: Follows with nephology at [facility]. - Avoid hypotension, meds as above 5. Obesity: Body mass index is 32.28 kg/m. - Continue semaglutide.  Follow up in 4 weeks with APP (add spiro back), and 3 months with Dr. Mitzie Anda.  Arlice Bene [facility] FNP-BC 05/04/2024

## 2024-05-04 ENCOUNTER — Encounter (HOSPITAL_COMMUNITY): Payer: Self-pay

## 2024-05-04 ENCOUNTER — Ambulatory Visit (HOSPITAL_COMMUNITY)
Admission: RE | Admit: 2024-05-04 | Discharge: 2024-05-04 | Disposition: A | Discharge status: Home / Self Care | Source: Ambulatory Visit | Attending: Family Medicine | Admitting: Family Medicine

## 2024-05-04 VITALS — BP 124/82 | HR 78 | Wt 194.0 lb

## 2024-05-04 DIAGNOSIS — Z79899 Other long term (current) drug therapy: Secondary | ICD-10-CM | POA: Insufficient documentation

## 2024-05-04 DIAGNOSIS — I2510 Atherosclerotic heart disease of native coronary artery without angina pectoris: Secondary | ICD-10-CM | POA: Diagnosis not present

## 2024-05-04 DIAGNOSIS — Z7984 Long term (current) use of oral hypoglycemic drugs: Secondary | ICD-10-CM | POA: Insufficient documentation

## 2024-05-04 DIAGNOSIS — Z794 Long term (current) use of insulin: Secondary | ICD-10-CM | POA: Insufficient documentation

## 2024-05-04 DIAGNOSIS — I255 Ischemic cardiomyopathy: Secondary | ICD-10-CM | POA: Insufficient documentation

## 2024-05-04 DIAGNOSIS — Z6832 Body mass index (BMI) 32.0-32.9, adult: Secondary | ICD-10-CM | POA: Diagnosis not present

## 2024-05-04 DIAGNOSIS — Z95810 Presence of automatic (implantable) cardiac defibrillator: Secondary | ICD-10-CM | POA: Insufficient documentation

## 2024-05-04 DIAGNOSIS — E1122 Type 2 diabetes mellitus with diabetic chronic kidney disease: Secondary | ICD-10-CM | POA: Insufficient documentation

## 2024-05-04 DIAGNOSIS — Z7982 Long term (current) use of aspirin: Secondary | ICD-10-CM | POA: Diagnosis not present

## 2024-05-04 DIAGNOSIS — Z7902 Long term (current) use of antithrombotics/antiplatelets: Secondary | ICD-10-CM | POA: Insufficient documentation

## 2024-05-04 DIAGNOSIS — Z87891 Personal history of nicotine dependence: Secondary | ICD-10-CM | POA: Insufficient documentation

## 2024-05-04 DIAGNOSIS — N183 Chronic kidney disease, stage 3 unspecified: Secondary | ICD-10-CM | POA: Insufficient documentation

## 2024-05-04 DIAGNOSIS — I5022 Chronic systolic (congestive) heart failure: Secondary | ICD-10-CM | POA: Diagnosis not present

## 2024-05-04 DIAGNOSIS — I130 Hypertensive heart and chronic kidney disease with heart failure and stage 1 through stage 4 chronic kidney disease, or unspecified chronic kidney disease: Secondary | ICD-10-CM | POA: Diagnosis not present

## 2024-05-04 DIAGNOSIS — J449 Chronic obstructive pulmonary disease, unspecified: Secondary | ICD-10-CM | POA: Diagnosis not present

## 2024-05-04 DIAGNOSIS — E669 Obesity, unspecified: Secondary | ICD-10-CM | POA: Diagnosis not present

## 2024-05-04 DIAGNOSIS — Z7985 Long-term (current) use of injectable non-insulin antidiabetic drugs: Secondary | ICD-10-CM | POA: Diagnosis not present

## 2024-05-04 MED ORDER — LOSARTAN POTASSIUM 25 MG PO TABS: 12.5000 mg | ORAL_TABLET | Freq: Every day | ORAL | 3 refills | Status: AC

## 2024-05-04 NOTE — Patient Instructions (Addendum)
 Thank you for coming in today  If you had labs drawn today, any labs that are abnormal the clinic will call you No news is good news  Medications: Start Losartan 12.5 mg 1/2 tablet daily Stop Entresto   Follow up appointments: Your physician recommends that you return for lab work in: 2 weeks BMET  Your physician recommends that you schedule a follow-up appointment in:  4 weeks in clinic  3 months With Dr. Mitzie Anda Please call our office to schedule the follow-up appointment in July for September 2025  Do the following things EVERYDAY: Weigh yourself in the morning before breakfast. Write it down and keep it in a log. Take your medicines as prescribed Eat low salt foods--Limit salt (sodium) to 2000 mg per day.  Stay as active as you can everyday Limit all fluids for the day to less than 2 liters   At the Advanced Heart Failure Clinic, you and your health needs are our priority. As part of our continuing mission to provide you with exceptional heart care, we have created designated Provider Care Teams. These Care Teams include your primary Cardiologist (physician) and Advanced Practice Providers (APPs- Physician Assistants and Nurse Practitioners) who all work together to provide you with the care you need, when you need it.   You may see any of the following providers on your designated Care Team at your next follow up: Dr Jules Oar Dr Peder Bourdon Dr. Mimi Alt, NP Ruddy [place]ral, [place] [facility] [place], [place] Dennise Fitz, NP Luster Salters, PharmD   Please be sure to bring in all your medications bottles to every appointment.    Thank you for choosing [facility]  If you have any questions or concerns before your next appointment please send us  a message through [place] or call our office at [phone].    TO LEAVE A MESSAGE FOR THE NURSE SELECT OPTION 2, PLEASE LEAVE A MESSAGE INCLUDING: YOUR  NAME DATE OF BIRTH CALL BACK NUMBER REASON FOR CALL**this is important as we prioritize the call backs  YOU WILL RECEIVE A CALL BACK THE SAME DAY AS LONG AS YOU CALL BEFORE 4:00 PM

## 2024-05-06 ENCOUNTER — Ambulatory Visit (HOSPITAL_COMMUNITY): Payer: Self-pay | Admitting: Family Medicine

## 2024-05-07 ENCOUNTER — Other Ambulatory Visit: Payer: Self-pay | Admitting: Cardiology

## 2024-05-11 ENCOUNTER — Other Ambulatory Visit: Payer: Self-pay

## 2024-05-11 MED ORDER — RANOLAZINE ER 500 MG PO TB12: 500.0000 mg | ORAL_TABLET | Freq: Two times a day (BID) | ORAL | 3 refills | Status: AC

## 2024-05-11 NOTE — Progress Notes (Signed)
 Remote ICD transmission.

## 2024-05-18 ENCOUNTER — Ambulatory Visit (HOSPITAL_COMMUNITY)
Admission: RE | Admit: 2024-05-18 | Discharge: 2024-05-18 | Disposition: A | Discharge status: Home / Self Care | Source: Ambulatory Visit | Attending: Cardiology | Admitting: Cardiology

## 2024-05-18 DIAGNOSIS — I5022 Chronic systolic (congestive) heart failure: Secondary | ICD-10-CM | POA: Diagnosis present

## 2024-05-18 LAB — BASIC METABOLIC PANEL WITH GFR
Anion gap: 9 (ref 5–15)
BUN: 12 mg/dL (ref 8–23)
CO2: 26 mmol/L (ref 22–32)
Calcium: 8.9 mg/dL (ref 8.9–10.3)
Chloride: 108 mmol/L (ref 98–111)
Creatinine, Ser: 1.37 mg/dL — ABNORMAL HIGH (ref 0.44–1.00)
GFR, Estimated: 41 mL/min — ABNORMAL LOW (ref >60)
Glucose, Bld: 122 mg/dL — ABNORMAL HIGH (ref 70–99)
Potassium: 3.7 mmol/L (ref 3.5–5.1)
Sodium: 143 mmol/L (ref 135–145)

## 2024-05-24 ENCOUNTER — Ambulatory Visit (INDEPENDENT_AMBULATORY_CARE_PROVIDER_SITE_OTHER): Admitting: Podiatry

## 2024-05-24 ENCOUNTER — Ambulatory Visit (INDEPENDENT_AMBULATORY_CARE_PROVIDER_SITE_OTHER)

## 2024-05-24 ENCOUNTER — Ambulatory Visit

## 2024-05-24 ENCOUNTER — Encounter: Payer: Self-pay | Admitting: Podiatry

## 2024-05-24 DIAGNOSIS — E1142 Type 2 diabetes mellitus with diabetic polyneuropathy: Secondary | ICD-10-CM

## 2024-05-24 DIAGNOSIS — S90111A Contusion of right great toe without damage to nail, initial encounter: Secondary | ICD-10-CM | POA: Diagnosis not present

## 2024-05-24 DIAGNOSIS — I739 Peripheral vascular disease, unspecified: Secondary | ICD-10-CM | POA: Diagnosis not present

## 2024-05-24 NOTE — Progress Notes (Signed)
 Chief Complaint  Patient presents with   Ingrown Toenail    Right hallux nail is causing pain. She has had the ingrown procedure previously, not by us . The borders look good, the end of the nail has som trauma related discoloration from a while back. The pain is in the later border, near the tip of the toe.  Last A1c was 7.0 April 3. Takes plavix  and ASA     HPI: 73 y.o. female presents today with concern for pain to the right first toe.  She locates the pain is associated with the distal nail plate.  She is concerned about potential ingrown.  She has had recurrent ingrowns in the past no other problems with the toenail.  She denies any potential injury.  She denies signs of infection.  Past Medical History:  Diagnosis Date   AICD (automatic cardioverter/defibrillator) present    Anemia 12/12/2019   Anginal pain (HCC)    Arthralgia 01/14/2020   Arthritis    Asthma    Atherosclerotic heart disease of native coronary artery without angina pectoris 04/13/2014   CHF (congestive heart failure) (HCC)    Chronic bilateral low back pain with bilateral sciatica 01/13/2019   Chronic bladder pain 11/08/2014   Last Assessment & Plan:  Formatting of this note might be different from the original. Patient has chronic pain syndrome in general I think this is unfortunately worsening her pelvic pain and bladder pain her examination today was very reassuring I am advised her to use the estrogen cream I did start her on Elavil 10 milligrams at that time if she does tolerated will increase it to 25 milligrams.    Chronic headaches    Chronic idiopathic constipation 12/22/2014   Formatting of this note might be different from the original. Last Assessment & Plan:  For better bowel emptying please use either Citrucel / Benefiber start with 2 tablespoons daily and titrate up or down to effect.  Also please use glycerin  suppositories as needed to assist in evacuation. Last Assessment & Plan:  Formatting of  this note might be different from the original. For better bowel empt   Chronic obstructive pulmonary disease, unspecified (HCC) 03/18/2018   CKD (chronic kidney disease) stage 3, GFR 30-59 ml/min (HCC)    Class 1 obesity due to excess calories with serious comorbidity and body mass index (BMI) of 31.0 to 31.9 in adult 06/26/2020   Colon polyps    COPD (chronic obstructive pulmonary disease) (HCC)    Coronary artery disease    DDD (degenerative disc disease), cervical 01/13/2019   Depression 11/05/2019   Diabetic polyneuropathy associated with type 2 diabetes mellitus (HCC) 02/24/2019   Diverticulitis 05/20/2018   Diverticulitis of colon 03/18/2018   Family history of ischemic heart disease (IHD) 08/16/2013   Fibromyalgia    Fibromyalgia affecting multiple sites 01/14/2020   Gastro-esophageal reflux disease without esophagitis 01/13/2019   Glaucoma 11/08/2014   Hordeolum externum of left upper eyelid 03/13/2020   Hypertension 11/08/2014   IBS (irritable bowel syndrome)    Iron deficiency anemia 01/13/2019   Left renal mass 11/08/2019   Leukocytosis 05/28/2018   Low vitamin B12 level 03/13/2020   Low vitamin D level 12/12/2019   LV dysfunction 05/31/2019   Malignant essential hypertension 01/22/2016   Migraine, unspecified, not intractable, without status migrainosus 11/08/2014   Mixed hyperlipidemia 01/13/2019   Myocardial infarction (HCC)    Other premature beats 11/08/2014   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 Last Assessment & Plan:  For vaginal atrophy please place a pea size dab of  Estrogen vaginal cream  into the vagina 3 times a week ( Monday, Wednesday, Friday) Last Assessment & Plan:  Formatting of this note might be different from the original. For vaginal atrophy please place a pea size dab of Estrogen vaginal cream  into the vagina     Past Surgical History:  Procedure Laterality Date   ABDOMINAL HYSTERECTOMY     BIV ICD INSERTION CRT-D N/A 12/31/2020   Procedure: BIV ICD INSERTION CRT-D;  Surgeon: Waddell Danelle ORN, MD;  Location: [facility] INVASIVE CV LAB;  Service: Cardiovascular;  Laterality: N/A;   BLEPHAROPLASTY Bilateral    COLON SURGERY     CORONARY ANGIOPLASTY WITH STENT PLACEMENT  2020   X3   CORONARY PRESSURE/FFR STUDY N/A 08/03/2020   Procedure: INTRAVASCULAR PRESSURE WIRE/FFR STUDY;  Surgeon: Wonda Sharper, MD;  Location: [facility] INVASIVE CV LAB;  Service: Cardiovascular;  Laterality: N/A;   CORONARY STENT INTERVENTION N/A 08/03/2020   Procedure: CORONARY STENT INTERVENTION;  Surgeon: Wonda Sharper, MD;  Location: [facility] INVASIVE CV LAB;  Service: Cardiovascular;  Laterality: N/A;   LEFT HEART CATH AND CORONARY ANGIOGRAPHY N/A 08/03/2020   Procedure: LEFT HEART CATH AND CORONARY ANGIOGRAPHY;  Surgeon: Wonda Sharper, MD;  Location: [facility] INVASIVE CV LAB;  Service: Cardiovascular;  Laterality: N/A;   RADIOFREQUENCY ABLATION  07/2023   REFRACTIVE SURGERY Left    [place]    RIGHT/LEFT HEART CATH AND CORONARY ANGIOGRAPHY N/A 10/13/2023   Procedure: RIGHT/LEFT HEART CATH AND CORONARY ANGIOGRAPHY;  Surgeon: Rolan Ezra RAMAN, MD;  Location: [facility] INVASIVE CV LAB;  Service: Cardiovascular;  Laterality: N/A;    Allergies  Allergen Reactions   Bee Venom Anaphylaxis   Sumatriptan Shortness Of Breath    Migraine worsened   Amoxicillin-Pot Clavulanate Diarrhea and Nausea And Vomiting    GI Intolerance   Oxycodone Itching and Hives    Other reaction(s): Other (see comments) Funny feeling in head  off the edge    Buprenorphine Hcl     Other reaction(s): Itching    Clarithromycin Diarrhea    Abdominal pain   Duloxetine  Hcl Other (See Comments)    drowsiness   Hydrocodone Itching   Lactose     Upset stomach, gas/bloating    Liraglutide Other (See Comments)    Abdominal discomfort   Tramadol Hcl Itching    ROS    Physical Exam: There were no vitals filed for this visit.  General: The patient is alert and oriented x3 in no acute distress.  Dermatology: Skin is warm, dry and supple bilateral lower extremities. Interspaces are clear of maceration and debris.  Nail plates do have some dystrophic features.  No signs of ingrowing toenails of the right first toe.  Vascular: Right foot pedal pulses difficult to palpate, left side faintly palpable DP and PT pulses.  Capillary refill less than 3 seconds the digits.  Pedal hair growth absent.  +1 pitting edema present.  Neurological: Light touch sensation grossly intact bilateral feet.  Protective sensation decreased  Musculoskeletal Exam: Tenderness on palpation of the distal tuft  of the right first toe.  No signs of inflammation associated with this.  No obvious deformity to the toe.  Radiographic Exam: Right foot 3 views weightbearing 05/24/2024 Normal osseous mineralization. Joint spaces preserved.  No fractures or osseous irregularities noted.  No osseous changes appreciated to the first toe distal phalanx at site of patient's pain.  Slight pes planus foot type noted.  Assessment/Plan of Care: 1. PVD (peripheral vascular disease) (HCC)   2. Contusion of right great toe without damage to nail, initial encounter   3. Diabetic polyneuropathy associated with type 2 diabetes mellitus (HCC)      No orders of the defined types were placed in this encounter.  VAS US  ABI WITH/WO TBI  Discussed clinical findings with patient today.  Radiographs reviewed with patient, some concern for old contusion of the toe however there are no osseous changes to suggest fracture or underlying spur to correlate with  patient's pain.  Do have some concern that this could be vascular in origin as the patient has diminished pulses to the right foot.  Ordering ABI to assess this.  Advise close monitoring in the meantime.  Gel toe cap dispensed to limit rubbing in shoes.  She is diabetic, increasing the risk of PAD and complications associated with this.  Follow-up as needed if symptoms fail to worsen or do not improve.  She does have diabetic footcare coming up in August and we will reevaluate at this point as well.   Marayah Higdon L. Lamount, DPM, AACFAS [facility]     [address] [place], [place] [address]                Office [phone]  Fax [phone]

## 2024-05-25 ENCOUNTER — Other Ambulatory Visit (HOSPITAL_COMMUNITY): Payer: Self-pay

## 2024-05-25 DIAGNOSIS — I5022 Chronic systolic (congestive) heart failure: Secondary | ICD-10-CM

## 2024-05-25 MED ORDER — EZETIMIBE 10 MG PO TABS: 10.0000 mg | ORAL_TABLET | Freq: Every day | ORAL | 6 refills | Status: AC

## 2024-05-25 NOTE — Progress Notes (Signed)
 [facility]  [address] [place],  [place]  [address] [phone]   Clinic Day:  05/26/2024  Referring physician: Silver Lamar LABOR, MD  HISTORY OF PRESENT ILLNESS:  The patient is a 73 y.o. 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  She comes in today to reassess her iron and hemoglobin levels.  Of note, she last received IV iron in August 2024.  Since her last visit, the patient claims to be doing fairly well.  She denies having increased fatigue or any overt forms of blood loss which concern her for progressive anemia.    PHYSICAL EXAM:  Blood pressure (!) 112/59, pulse 67, temperature 97.9 F (36.6 C), temperature source Oral, resp. rate 20, height 5' 5 (1.651 m), weight 193 lb (87.5 kg), SpO2 98%. Wt Readings from Last 3 Encounters:  05/26/24 193 lb (87.5 kg)  05/04/24 194 lb (88 kg)  04/11/24 195 lb 12.8 oz (88.8 kg)   Body mass index is 32.12 kg/m. Performance status (ECOG): 1 - Symptomatic but completely ambulatory Physical Exam Constitutional:      General: She is not in acute distress.    Appearance: Normal appearance. She is normal weight.  HENT:     Head: Normocephalic and atraumatic.   Eyes:     General: No scleral icterus.    Extraocular Movements: Extraocular movements intact.     Conjunctiva/sclera: Conjunctivae normal.     Pupils: Pupils are equal, round, and reactive to light.    Cardiovascular:     Rate and Rhythm: Normal rate and regular rhythm.     Pulses: Normal pulses.     Heart sounds: Normal heart sounds. No murmur heard.    No friction rub. No gallop.  Pulmonary:     Effort: Pulmonary effort is normal. No respiratory distress.     Breath sounds: Normal breath sounds.  Abdominal:     General: Bowel sounds are normal. There is no distension.     Palpations: Abdomen is soft. There is no hepatomegaly, splenomegaly or mass.     Tenderness: There is no abdominal  tenderness.   Musculoskeletal:        General: Normal range of motion.     Cervical back: Normal range of motion and neck supple.     Right lower leg: No edema.     Left lower leg: No edema.  Lymphadenopathy:     Cervical: No cervical adenopathy.   Skin:    General: Skin is warm and dry.   Neurological:     General: No focal deficit present.     Mental Status: She is alert and oriented to person, place, and time. Mental status is at baseline.   Psychiatric:        Mood and Affect: Mood normal.        Behavior: Behavior normal.        Thought Content: Thought content normal.        Judgment: Judgment normal.    LABS:      Latest Ref Rng & Units 05/26/2024    2:33 PM 01/26/2024    1:57 PM 01/11/2024    2:28 PM  CBC  WBC 4.0 - 10.5 K/uL 10.3  9.7  8.2   Hemoglobin 12.0 - 15.0 g/dL 87.7  87.6  87.1   Hematocrit 36.0 - 46.0 % 38.7  38.5  40.4   Platelets 150 - 400 K/uL 292  356  274  Latest Reference Range & Units 05/26/24 14:33  Iron 28 - 170 ug/dL 54  UIBC ug/dL 700  TIBC 749 - 549 ug/dL 646  Saturation Ratios 10.4 - 31.8 % 15  Ferritin 11 - 307 ng/mL 16   ASSESSMENT & PLAN:  Assessment/Plan:  A 73 y.o. female with anemia secondary to iron deficiency and previous renal insufficiency.  I am pleased as her hemoglobin has essentially held stable over these past few months without any particular intervention.  Although lower than previously, her current iron parameters are not consistent with recurrent iron deficiency.  Clinically, the patient appears to be doing well.  I will see her back in 6 months for repeat clinical assessment.  The patient understands all the plans discussed today and is in agreement with them.  Milana Salay DELENA Kerns, MD

## 2024-05-26 ENCOUNTER — Inpatient Hospital Stay: Payer: Medicare Other | Attending: Oncology | Admitting: Oncology

## 2024-05-26 ENCOUNTER — Other Ambulatory Visit: Payer: Self-pay | Admitting: Oncology

## 2024-05-26 ENCOUNTER — Inpatient Hospital Stay: Payer: Medicare Other

## 2024-05-26 ENCOUNTER — Other Ambulatory Visit: Payer: Self-pay

## 2024-05-26 VITALS — BP 112/59 | HR 67 | Temp 97.9°F | Resp 20 | Ht 65.0 in | Wt 193.0 lb

## 2024-05-26 DIAGNOSIS — D508 Other iron deficiency anemias: Secondary | ICD-10-CM

## 2024-05-26 DIAGNOSIS — E611 Iron deficiency: Secondary | ICD-10-CM | POA: Diagnosis not present

## 2024-05-26 DIAGNOSIS — D631 Anemia in chronic kidney disease: Secondary | ICD-10-CM | POA: Diagnosis present

## 2024-05-26 DIAGNOSIS — N189 Chronic kidney disease, unspecified: Secondary | ICD-10-CM | POA: Insufficient documentation

## 2024-05-26 LAB — CBC WITH DIFFERENTIAL (CANCER CENTER ONLY)
Abs Immature Granulocytes: 0.04 10*3/uL (ref 0.00–0.07)
Basophils Absolute: 0.1 10*3/uL (ref 0.0–0.1)
Basophils Relative: 1 %
Eosinophils Absolute: 0.2 10*3/uL (ref 0.0–0.5)
Eosinophils Relative: 2 %
HCT: 38.7 % (ref 36.0–46.0)
Hemoglobin: 12.2 g/dL (ref 12.0–15.0)
Immature Granulocytes: 0 %
Lymphocytes Relative: 29 %
Lymphs Abs: 3 10*3/uL (ref 0.7–4.0)
MCH: 28.6 pg (ref 26.0–34.0)
MCHC: 31.5 g/dL (ref 30.0–36.0)
MCV: 90.6 fL (ref 80.0–100.0)
Monocytes Absolute: 0.8 10*3/uL (ref 0.1–1.0)
Monocytes Relative: 8 %
Neutro Abs: 6.3 10*3/uL (ref 1.7–7.7)
Neutrophils Relative %: 60 %
Platelet Count: 292 10*3/uL (ref 150–400)
RBC: 4.27 MIL/uL (ref 3.87–5.11)
RDW: 15.6 % — ABNORMAL HIGH (ref 11.5–15.5)
WBC Count: 10.3 10*3/uL (ref 4.0–10.5)
nRBC: 0 % (ref 0.0–0.2)

## 2024-05-26 LAB — FERRITIN: Ferritin: 16 ng/mL (ref 11–307)

## 2024-05-26 LAB — IRON AND TIBC
Iron: 54 ug/dL (ref 28–170)
Saturation Ratios: 15 % (ref 10.4–31.8)
TIBC: 353 ug/dL (ref 250–450)
UIBC: 299 ug/dL

## 2024-05-26 LAB — CMP (CANCER CENTER ONLY)
ALT: 17 U/L (ref 0–44)
AST: 23 U/L (ref 15–41)
Albumin: 4 g/dL (ref 3.5–5.0)
Alkaline Phosphatase: 76 U/L (ref 38–126)
Anion gap: 12 (ref 5–15)
BUN: 23 mg/dL (ref 8–23)
CO2: 26 mmol/L (ref 22–32)
Calcium: 8.9 mg/dL (ref 8.9–10.3)
Chloride: 105 mmol/L (ref 98–111)
Creatinine: 1.42 mg/dL — ABNORMAL HIGH (ref 0.44–1.00)
GFR, Estimated: 39 mL/min — ABNORMAL LOW (ref >60)
Glucose, Bld: 142 mg/dL — ABNORMAL HIGH (ref 70–99)
Potassium: 3.2 mmol/L — ABNORMAL LOW (ref 3.5–5.1)
Sodium: 143 mmol/L (ref 135–145)
Total Bilirubin: 0.3 mg/dL (ref 0.0–1.2)
Total Protein: 7 g/dL (ref 6.5–8.1)

## 2024-05-27 ENCOUNTER — Telehealth: Payer: Self-pay | Admitting: Oncology

## 2024-05-27 ENCOUNTER — Encounter: Payer: Self-pay | Admitting: Oncology

## 2024-05-27 NOTE — Telephone Encounter (Signed)
 Patient has been scheduled for follow-up visit per 05/25/24 LOS.  Pt aware of scheduled appt details.

## 2024-06-06 ENCOUNTER — Telehealth: Payer: Self-pay | Admitting: Gastroenterology

## 2024-06-06 ENCOUNTER — Telehealth (HOSPITAL_COMMUNITY): Payer: Self-pay | Admitting: *Deleted

## 2024-06-06 MED ORDER — DICYCLOMINE HCL 10 MG PO CAPS: ORAL_CAPSULE | ORAL | 0 refills | Status: DC

## 2024-06-06 NOTE — Telephone Encounter (Signed)
 Patient called and stated that she is needing a refill on her Bentyl  10 MG. Please advise.

## 2024-06-06 NOTE — Telephone Encounter (Signed)
 I spoke with Karen Warner and confirmed her pharmacy. She takes the dicyclomine  10 mg capsules prn. She has an August appointment with Camie Furbish, PA.

## 2024-06-06 NOTE — Telephone Encounter (Signed)
 Called to confirm/remind patient of their appointment at [facility] on 06/06/24.       Appointment:              [x] Confirmed             [] Left mess              [] No answer/No voice mail             [] Phone not in service   Patient reminded to bring all medications and/or complete list.   Confirmed patient has transportation. Gave directions, instructed to utilize valet parking.

## 2024-06-06 NOTE — Progress Notes (Signed)
 PCP: Silver Lamar LABOR, MD Cardiology: Dr. Sheena HF Cardiology: Dr. Rolan   73 y.o. with history of CAD and ischemic cardiomyopathy and COPD. Patient had PCI to mid/distal LAD in 2020, then had unstable angina in 9/21 with DES to 70% stenosis proximal LAD.  She has had a cardiomyopathy for a number of years, [employer] CRT-D device placed in 2022.  Last echo in 8/24 showed EF < 20%, moderate LV dilation, mild LVH, RV normal.  She additionally has a diagnosis of COPD, quit smoking in 2021. She lives in [place], originally from [place]  but moved here to be near family.   LHC/RHC in 11/24 showed patent LAD stents with distal edge dissection in the apical LAD (no change from prior films), normal filling pressures, CI low at 2.12.   S/p barostim implant 2/25.  Today she returns for HF follow up. Overall feeling fine. She is SOB walking  short distances on flat ground. Does OK with ADLs. Feels palpitations occasionally, had episode last week where she became sweaty and took a nitroglycerin  with asa. Swelling in hands. Denies abnormal bleeding, CP, dizziness, or PND/Orthopnea. Appetite ok. Weight at home 191 pounds. Taking all medications. BP at home 120-140/70s.  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   Labs (7/24): K 4, creatinine 1.45 Labs (10/24): K 4.1, creatinine 1.6, LDL 74 Labs (12/24): LDL 41 Labs (2/25): K 3.5, creatinine 1.30 Labs (5/25): K 3.9, creatinine 1.24, LDL 48  ECG (personally reviewed):  barostim artifact, V-paced  PMH: 1. Fe deficiency anemia 2. OSA: Does not tolerate CPAP.  3. HTN 4. IBS 5. COPD: Quit smoking in 2021.  6. Depression  7. Type 2 diabetes 8. CAD: PCI mid-distal LAD in 2020.  - LHC (9/21): Unstable angina, 70% proximal LAD treated with DES.  - LHC (11/24): Patent LAD stents with distal edge dissection in the apical LAD (no change from prior films) 9. Chronic systolic CHF:  Suspect ischemic cardiomyopathy.  [employer] CRT-D device.  - Echo (8/22): EF 25-30% - Echo (3/23): EF 30% - Echo (8/24): EF < 20%, moderate LV dilation, mild LVH, RV normal - RHC (11/24): mean RA 3, PA 35/9, mean PCWP 9, CI 2.12, PAPi 8.7 - s/p  barostim 2/25 10. CKD stage 3  FH: No sudden death or cardiomyopathy  Social History   Socioeconomic History   Marital status: Significant Other    Spouse name: Not on file   Number of children: 3   Years of education: Not on file   Highest education level: Not on file  Occupational History   Occupation: retired  Tobacco Use   Smoking status: Former    Current packs/day: 0.00    Types: Cigarettes    Quit date: 2021    Years since quitting: 4.5   Smokeless tobacco: Never  Vaping Use   Vaping status: Never Used  Substance and Sexual Activity   Alcohol use: Never   Drug use: Never   Sexual activity: Not on file  Other Topics Concern   Not on file  Social History Narrative   Not on file   Social Drivers of Health   Financial Resource Strain: Low Risk  (05/18/2024)   Received from [facility]   Overall Financial Resource Strain (CARDIA)    Difficulty of Paying Living Expenses: Not hard at all  Food Insecurity: No Food Insecurity (05/18/2024)   Received from [facility]   Hunger Vital Sign    Within  the past 12 months, you worried that your food would run out before you got the money to buy more.: Never true    Within the past 12 months, the food you bought just didn't last and you didn't have money to get more.: Never true  Transportation Needs: No Transportation Needs (05/18/2024)   Received from [facility]   PRAPARE - Transportation    Lack of Transportation (Medical): No    Lack of Transportation (Non-Medical): No  Physical Activity: Not on file  Stress: Not on file  Social Connections: Not on file  Intimate Partner Violence: Not on file   ROS: All systems reviewed and negative except as per HPI.    Current Outpatient Medications  Medication Sig Dispense Refill   acetaminophen  (TYLENOL ) 650 MG CR tablet Take 650 mg by mouth every 8 (eight) hours as needed for pain.     albuterol  (PROVENTIL ) (2.5 MG/3ML) 0.083% nebulizer solution Take 2.5 mg by nebulization every 4 (four) hours as needed for wheezing or shortness of breath.     aspirin  81 MG EC tablet Take 81 mg by mouth daily.     baclofen (LIORESAL) 10 MG tablet Take 10 mg by mouth 3 (three) times daily.     BD PEN NEEDLE NANO 2ND GEN 32G X 4 MM MISC 3 (three) times daily.     carvedilol  (COREG ) 3.125 MG tablet Take 1 tablet (3.125 mg total) by mouth 2 (two) times daily with a meal. 180 tablet 2   clopidogrel  (PLAVIX ) 75 MG tablet TAKE 1 TABLET(75 MG) BY MOUTH DAILY 90 tablet 0   Continuous Glucose Sensor (DEXCOM G7 SENSOR) MISC SMARTSIG:1 Topical Every 10 Days     dapagliflozin  propanediol (FARXIGA ) 10 MG TABS tablet TAKE 1 TABLET(10 MG) BY MOUTH DAILY BEFORE BREAKFAST 90 tablet 3   dicyclomine  (BENTYL ) 10 MG capsule TAKE 1 CAPSULE(10 MG) BY MOUTH IN THE MORNING AND AT BEDTIME 180 capsule 1   digoxin  (LANOXIN ) 0.125 MG tablet TAKE 1/2 TABLET(0.063 MG) BY MOUTH DAILY 15 tablet 3   ezetimibe  (ZETIA ) 10 MG tablet Take 1 tablet (10 mg total) by mouth daily. 30 tablet 6   famotidine  (PEPCID ) 40 MG tablet TAKE 1 TABLET(40 MG) BY MOUTH TWICE DAILY 60 tablet 2   folic acid  (FOLVITE ) 1 MG tablet Take 1 mg by mouth daily.     insulin  lispro (HUMALOG) 100 UNIT/ML KwikPen Inject 2-20 Units into the skin 3 (three) times daily with meals.  per sliding scale     lidocaine  (LIDODERM ) 5 % Place 1 patch onto the skin daily. Remove & Discard patch within 12 hours or as directed by MD (Patient taking differently: Place 1 patch onto the skin daily as needed (Pain). Remove & Discard patch within 12 hours or as directed by MD) 5 patch 0   loratadine (CLARITIN) 10 MG tablet Take 10 mg by mouth daily.     losartan  (COZAAR ) 25 MG tablet Take 0.5 tablets (12.5 mg  total) by mouth daily. 45 tablet 3   nitroGLYCERIN  (NITROSTAT ) 0.4 MG SL tablet DISSOLVE 1 TABLET UNDER TONGUE EVERY 5 MINUTES AS NEEDED FOR CHEST PAIN. 25 tablet 3   OZEMPIC, 0.25 OR 0.5 MG/DOSE, 2 MG/3ML SOPN Inject 0.5 mg into the skin every Monday.     pantoprazole  (PROTONIX ) 40 MG tablet TAKE 1 TABLET(40 MG) BY MOUTH DAILY 90 tablet 0   Plecanatide  (TRULANCE ) 3 MG TABS Take 1 tablet (3 mg total) by mouth daily. 30 tablet 2   pregabalin  (LYRICA )  50 MG capsule Take 50 mg by mouth 2 (two) times daily.     ranolazine  (RANEXA ) 500 MG 12 hr tablet Take 1 tablet (500 mg total) by mouth 2 (two) times daily. 180 tablet 3   rosuvastatin  (CRESTOR ) 40 MG tablet Take 40 mg by mouth at bedtime.     torsemide  (DEMADEX ) 20 MG tablet 40 mg in the morning and 20 mg in the evening, alternating with 20 mg Twice daily 180 tablet 3   TRESIBA FLEXTOUCH 200 UNIT/ML FlexTouch Pen Inject 35 Units into the skin 2 (two) times daily.     TRINTELLIX  5 MG TABS tablet Take 5 mg by mouth at bedtime.     Ubrogepant 50 MG TABS Take 50 mg by mouth daily as needed (Headache).     valACYclovir (VALTREX) 500 MG tablet Take 500 mg by mouth daily.     vitamin B-12 (CYANOCOBALAMIN ) 1000 MCG tablet Take 1,000 mcg by mouth daily.     Vitamin D, Ergocalciferol, (DRISDOL) 1.25 MG (50000 UNIT) CAPS capsule Take 50,000 Units by mouth every 7 (seven) days.     No current facility-administered medications for this visit.   Wt Readings from Last 3 Encounters:  05/26/24 87.5 kg (193 lb)  05/04/24 88 kg (194 lb)  04/11/24 88.8 kg (195 lb 12.8 oz)   There were no vitals taken for this visit. Physical Exam General:  NAD. No resp difficulty, walked into clinic, elderly HEENT: Normal Neck: Supple. No JVD. Cor: Regular rate & rhythm. No rubs, gallops or murmurs. Lungs: Clear Abdomen: Soft, nontender, nondistended.  Extremities: No cyanosis, clubbing, rash, edema Neuro: Alert & oriented x 3, moves all 4 extremities w/o difficulty. Affect  pleasant.  Assessment/Plan: 1. Chronic systolic CHF: Ischemic cardiomyopathy.  [employer] CRT-D device.  Echo in 8/24 showed EF < 20%, moderate LV dilation, mild LVH, RV normal.  This is somewhat worse than priors. LHC/RHC in 11/24 showed low CI at 2.12, normal filling pressures, unchanged coronary anatomy with no severe stenoses. S/p barostim 2/25. NYHA class III symptoms with prominent fatigue, somewhat better since barostim.  She is not volume overloaded on exam. GDMT has been limited by hypotension. - Off Entresto , spiro and verquvo  due to hypotension. - Start losartan  12.5 mg daily. BMET in 2 weeks. - Continue torsemide  40 qam/20 qpm alternating with 20 mg bid.  Labs reviewed from [facility] on 04/08/24 and are stable - Continue Coreg  3.125 mg bid.  - Continue dapagliflozin  10 mg daily.  - Continue digoxin  0.0625 mg daily. Dig level stable 0.4 on 02/09/24 - She has been referred to cardiac rehab at the hospital in [place].  2.  CAD: PCI to mid/distal LAD in 2020 and proximal LAD in 2021.  LHC in 11/24 showed stable coronary anatomy with no severe stenoses. No chest pain. - Continue ranolazine  500 mg bid. - Continue ASA/Plavix .  - Continue Crestor  + Zetia , goal LDL < 55. Lipid panel from 04/08/24 reviewed from [facility], LDL 48 3. COPD: History of smoking, quit 2021.  Should get eventual PFTs.  4. CKD 3: Follows with nephology at [facility]. - Avoid hypotension, meds as above 5. Obesity: There is no height or weight on file to calculate BMI. - Continue semaglutide.  Follow up in 4 weeks with APP (add spiro back), and 3 months with Dr. Rolan.  Harlene HERO [facility] FNP-BC 06/06/2024

## 2024-06-07 ENCOUNTER — Ambulatory Visit (HOSPITAL_COMMUNITY)
Admission: RE | Admit: 2024-06-07 | Discharge: 2024-06-07 | Disposition: A | Discharge status: Home / Self Care | Source: Ambulatory Visit | Attending: Family Medicine | Admitting: Family Medicine

## 2024-06-07 ENCOUNTER — Encounter (HOSPITAL_COMMUNITY): Payer: Self-pay

## 2024-06-07 ENCOUNTER — Other Ambulatory Visit (HOSPITAL_COMMUNITY): Payer: Self-pay | Admitting: *Deleted

## 2024-06-07 ENCOUNTER — Ambulatory Visit (HOSPITAL_COMMUNITY): Payer: Self-pay | Admitting: Family Medicine

## 2024-06-07 VITALS — BP 126/62 | HR 81 | Ht 65.0 in | Wt 193.6 lb

## 2024-06-07 DIAGNOSIS — N183 Chronic kidney disease, stage 3 unspecified: Secondary | ICD-10-CM | POA: Diagnosis not present

## 2024-06-07 DIAGNOSIS — E669 Obesity, unspecified: Secondary | ICD-10-CM | POA: Insufficient documentation

## 2024-06-07 DIAGNOSIS — Z79899 Other long term (current) drug therapy: Secondary | ICD-10-CM | POA: Insufficient documentation

## 2024-06-07 DIAGNOSIS — Z6832 Body mass index (BMI) 32.0-32.9, adult: Secondary | ICD-10-CM | POA: Insufficient documentation

## 2024-06-07 DIAGNOSIS — Z7982 Long term (current) use of aspirin: Secondary | ICD-10-CM | POA: Insufficient documentation

## 2024-06-07 DIAGNOSIS — Z794 Long term (current) use of insulin: Secondary | ICD-10-CM | POA: Diagnosis not present

## 2024-06-07 DIAGNOSIS — Z7984 Long term (current) use of oral hypoglycemic drugs: Secondary | ICD-10-CM | POA: Diagnosis not present

## 2024-06-07 DIAGNOSIS — I255 Ischemic cardiomyopathy: Secondary | ICD-10-CM | POA: Insufficient documentation

## 2024-06-07 DIAGNOSIS — I959 Hypotension, unspecified: Secondary | ICD-10-CM | POA: Diagnosis not present

## 2024-06-07 DIAGNOSIS — E1122 Type 2 diabetes mellitus with diabetic chronic kidney disease: Secondary | ICD-10-CM | POA: Diagnosis not present

## 2024-06-07 DIAGNOSIS — Z4502 Encounter for adjustment and management of automatic implantable cardiac defibrillator: Secondary | ICD-10-CM | POA: Insufficient documentation

## 2024-06-07 DIAGNOSIS — I130 Hypertensive heart and chronic kidney disease with heart failure and stage 1 through stage 4 chronic kidney disease, or unspecified chronic kidney disease: Secondary | ICD-10-CM | POA: Insufficient documentation

## 2024-06-07 DIAGNOSIS — Z7902 Long term (current) use of antithrombotics/antiplatelets: Secondary | ICD-10-CM | POA: Diagnosis not present

## 2024-06-07 DIAGNOSIS — I5022 Chronic systolic (congestive) heart failure: Secondary | ICD-10-CM | POA: Insufficient documentation

## 2024-06-07 DIAGNOSIS — Z955 Presence of coronary angioplasty implant and graft: Secondary | ICD-10-CM | POA: Insufficient documentation

## 2024-06-07 DIAGNOSIS — J449 Chronic obstructive pulmonary disease, unspecified: Secondary | ICD-10-CM | POA: Insufficient documentation

## 2024-06-07 DIAGNOSIS — I2510 Atherosclerotic heart disease of native coronary artery without angina pectoris: Secondary | ICD-10-CM | POA: Insufficient documentation

## 2024-06-07 DIAGNOSIS — Z87891 Personal history of nicotine dependence: Secondary | ICD-10-CM | POA: Insufficient documentation

## 2024-06-07 DIAGNOSIS — Z7985 Long-term (current) use of injectable non-insulin antidiabetic drugs: Secondary | ICD-10-CM | POA: Diagnosis not present

## 2024-06-07 LAB — BASIC METABOLIC PANEL WITH GFR
Anion gap: 14 (ref 5–15)
BUN: 26 mg/dL — ABNORMAL HIGH (ref 8–23)
CO2: 24 mmol/L (ref 22–32)
Calcium: 8.9 mg/dL (ref 8.9–10.3)
Chloride: 105 mmol/L (ref 98–111)
Creatinine, Ser: 1.47 mg/dL — ABNORMAL HIGH (ref 0.44–1.00)
GFR, Estimated: 37 mL/min — ABNORMAL LOW (ref >60)
Glucose, Bld: 99 mg/dL (ref 70–99)
Potassium: 3.3 mmol/L — ABNORMAL LOW (ref 3.5–5.1)
Sodium: 143 mmol/L (ref 135–145)

## 2024-06-07 MED ORDER — SPIRONOLACTONE 25 MG PO TABS: 12.5000 mg | ORAL_TABLET | Freq: Every day | ORAL | 3 refills | Status: DC

## 2024-06-07 MED ORDER — TORSEMIDE 20 MG PO TABS: 20.0000 mg | ORAL_TABLET | Freq: Two times a day (BID) | ORAL | 3 refills | Status: DC

## 2024-06-07 NOTE — Patient Instructions (Addendum)
 Decrease Torsemide  to 20 mg twice daily - updated Rx sent. Re-start Spiro 12.5 mg daily - Rx sent. Labs today - will call you if abnormal. Repeat lab in one week at [facility]. Return to see Dr. Rolan in 3 months. PLEASE CALL [phone] IN SEPTEMBER TO SCHEDULE THIS APPOINTMENT. Please call us  at [phone] if any questions or concerns prior to your next visit.    Your local [employer]  Or  [facility] [address] [place], [place] [address]

## 2024-06-09 ENCOUNTER — Telehealth: Payer: Self-pay | Admitting: Cardiology

## 2024-06-09 NOTE — Telephone Encounter (Signed)
   Pre-operative Risk Assessment    Patient Name: Karen Warner  DOB: [birth year] MRN: [id number]      Request for Surgical Clearance    Procedure:  CT Cervical Myelogram  Date of Surgery:  Clearance 10/22/24                                 Surgeon:  Dr Ozell Bohr Surgeon's Group or Practice Name:  [facility] Phone number:  [phone] Fax number:  [phone]   Type of Clearance Requested:   - Pharmacy:  Hold Clopidogrel  (Plavix ) 3 days before   Type of Anesthesia:  none   Additional requests/questions:    [name]   06/09/2024, 11:52 AM

## 2024-06-10 ENCOUNTER — Telehealth: Payer: Self-pay | Admitting: Cardiology

## 2024-06-10 ENCOUNTER — Telehealth (HOSPITAL_COMMUNITY): Payer: Self-pay | Admitting: Cardiology

## 2024-06-10 NOTE — Progress Notes (Unsigned)
  Cardiology Office Note:   Date:  06/10/2024  ID:  Karen Warner, DOB Oct 27, 1951, MRN [id number]  Primary Cardiologist: Dub Huntsman, DO Electrophysiologist: Danelle Birmingham, MD   History of Present Illness:   Karen Warner is a 73 y.o. female with h/o chronic systolic CHF, ICM, CAD, HTN, HLD, COPD, OSA not on CPAP, DM2, and GERD seen today for routine electrophysiology followup.   At prior visit, device left at 5.6 milliamp with continued neck tightness and soft pressures.   Since last being seein in our clinic doing about the same.  She has been told she has a pinched nerve in her neck, and is pending Myelogram next week.   Energy is OK but still not doing any set aside exercise.  PT deferred in the setting of her neck issues.   Review of systems complete and found to be negative unless listed in HPI.    EP Information / Studies Reviewed:    EKG is not ordered today. EKG from 05/04/2024 showed HR of 79 with significant artifact from barostim, but likely NSR.   Arrhythmia/Device History [employer] BiV ICD implanted 12/31/2020 for LBBB, HFrEF/ICM Barostim implanted 01/13/2024  Barostim Interrogation- Performed personally and reviewed in detail today,  See scanned report  ICD/PPM interrogation - Not performed today. See last [facility] report    Physical Exam:   VS:  There were no vitals taken for this visit.   Wt Readings from Last 3 Encounters:  06/07/24 193 lb 9.6 oz (87.8 kg)  05/26/24 193 lb (87.5 kg)  05/04/24 194 lb (88 kg)     GEN: Well nourished, well developed in no acute distress NECK: No JVD; No carotid bruits CARDIAC: Regular rate and rhythm, no murmurs, rubs, gallops RESPIRATORY:  Clear to auscultation without rales, wheezing or rhonchi  ABDOMEN: Soft, non-tender, non-distended EXTREMITIES:  No edema; No deformity    ASSESSMENT AND PLAN:    Chronic systolic CHF s/p Chief Financial Officer and Barostim implantation NYHA III  symptoms.  Confounded by deconditioning  Device left at 5.6 millamp with soft pressure and continued stiffness/aching in neck; Confounded by pinched nerve Device impedence stable Pt goals are to continue to improve her functional status and tolerance for cooking and visiting with her family. Normal device function  See scanned report.  Migraines HA Chronic component.   Do not think this is directly from device therapy.  Recommended PCP follow up and continued monitoring.   CAD No s/s of ischemia.      HTN Stable on current regimen    Disposition:   Follow up with EP APP in 3-4 months  If her neck work up is unrevealing, we can turn Barostim Down/Off for a few weeks to assess how much it is contributing. Previously felt not to be, but happy to try.  Signed, Ozell Prentice Passey, PA-C

## 2024-06-10 NOTE — Telephone Encounter (Signed)
 See I left message for Sari earlier today.     Note Follow Up:             Sari would like for you to call her asap, about patient's clearance       06/10/24  1:33 PM [facility] contacted Jenel Kyra HERO

## 2024-06-10 NOTE — Telephone Encounter (Signed)
 I will update the requesting office to see notes from Scot Ford, [facility] I will also reach out to HF clinic to schedule preop appt and 3 month f/u appt as stated by Scot Ford, [facility] for October 2025.

## 2024-06-10 NOTE — Telephone Encounter (Signed)
 Sari with requesting office called and a secure chat was sent to me. I called back as instructed ASAP and I was told I was being transferred to [facility], however was placed into her vm.

## 2024-06-10 NOTE — Telephone Encounter (Signed)
 Procedure is more than 64-month away, cardiac clearance is only good for 2 to 55-month.  Patient was recently seen by heart failure service who recommended 6-month follow-up, around October with Dr. Rolan.  Cardiac clearance can be addressed at that time.  Please set up heart failure clinic follow-up around October with cardiac clearance [place] the reason for follow-up.

## 2024-06-10 NOTE — Telephone Encounter (Signed)
 Follow Up:       Sari would like for you to call her asap, about patient's clearance

## 2024-06-13 ENCOUNTER — Ambulatory Visit: Attending: Student | Admitting: Student

## 2024-06-13 ENCOUNTER — Encounter: Payer: Self-pay | Admitting: Student

## 2024-06-13 VITALS — BP 110/60 | HR 87 | Ht 65.0 in | Wt 195.9 lb

## 2024-06-13 DIAGNOSIS — I10 Essential (primary) hypertension: Secondary | ICD-10-CM | POA: Diagnosis present

## 2024-06-13 DIAGNOSIS — I2510 Atherosclerotic heart disease of native coronary artery without angina pectoris: Secondary | ICD-10-CM | POA: Insufficient documentation

## 2024-06-13 DIAGNOSIS — I5022 Chronic systolic (congestive) heart failure: Secondary | ICD-10-CM | POA: Diagnosis not present

## 2024-06-13 NOTE — Patient Instructions (Signed)
 Medication Instructions:  Your physician recommends that you continue on your current medications as directed. Please refer to the Current Medication list given to you today.  *If you need a refill on your cardiac medications before your next appointment, please call your pharmacy*  Lab Work: None ordered If you have labs (blood work) drawn today and your tests are completely normal, you will receive your results only by: MyChart Message (if you have MyChart) OR A paper copy in the mail If you have any lab test that is abnormal or we need to change your treatment, we will call you to review the results.  Follow-Up: At [facility], you and your health needs are our priority.  As part of our continuing mission to provide you with exceptional heart care, our providers are all part of one team.  This team includes your primary Cardiologist (physician) and Advanced Practice Providers or APPs (Physician Assistants and Nurse Practitioners) who all work together to provide you with the care you need, when you need it.  Your next appointment:   3-4 month(s)  Provider:   Ozell Jodie Passey, PA-C

## 2024-06-14 ENCOUNTER — Ambulatory Visit: Attending: Podiatry

## 2024-06-14 DIAGNOSIS — I739 Peripheral vascular disease, unspecified: Secondary | ICD-10-CM | POA: Diagnosis present

## 2024-06-14 NOTE — Telephone Encounter (Signed)
 Sari from [facility] called this morning to let our office know that the date of procedure is not 10/22/24 it is 06/21/24.   I assured Sari that I will have the preop APP review and see what we need to do to take care of the pt.

## 2024-06-15 LAB — VAS US ABI WITH/WO TBI
Left ABI: 0.94
Right ABI: 0.65

## 2024-06-15 NOTE — Telephone Encounter (Signed)
 Sari from [facility] is calling to see about pt holding her Plavix .  Procedure 7/22 and she will need to hold it for 3 days which would be sat so they need to know if that is ok. Sari is off tomorrow and needs to know about the hold or they may have to r/s her. Please advise

## 2024-06-15 NOTE — Telephone Encounter (Signed)
   Patient Name: Karen Warner  DOB: [birth year] MRN: [id number]  Primary Cardiologist: Kardie Tobb, DO  Chart reviewed as part of pre-operative protocol coverage. Given past medical history and time since last visit, based on ACC/AHA guidelines, Karen Warner is at acceptable risk for the planned procedure without further cardiovascular testing.   The patient was advised that if she develops new symptoms prior to surgery to contact our office to arrange for a follow-up visit, and she verbalized understanding.  Per Dr. Sheena 06/15/2024 Per office protocol, if patient is without any new symptoms or concerns at the time of their virtual visit, she may hold Plavix  for 3 days prior to procedure. Please resume Plavix  as soon as possible postprocedure, at the discretion of the surgeon.    I will route this recommendation to the requesting party via Epic fax function and remove from pre-op pool.  Please call with questions.  Lamarr Satterfield, NP 06/15/2024, 3:52 PM

## 2024-06-16 ENCOUNTER — Encounter: Payer: Self-pay | Admitting: Cardiology

## 2024-06-16 ENCOUNTER — Ambulatory Visit: Payer: Self-pay | Admitting: Podiatry

## 2024-06-16 DIAGNOSIS — I739 Peripheral vascular disease, unspecified: Secondary | ICD-10-CM

## 2024-06-16 NOTE — Progress Notes (Signed)
 ABIs reviewed showing moderate disease right lower extremity, mild disease left lower extremity.  Given that she has had pain to the right first toe without clinical signs of other injury, I recommend that she get established with a vascular surgeon or interventional cardiologist for her PAD if she has not already.

## 2024-06-16 NOTE — Telephone Encounter (Addendum)
 Pre-op has faxed clearance to requesting office. Spoke with requesting office radiology dept and confirmed that they have received the clearance. She stated that they will contact patient today.

## 2024-06-20 ENCOUNTER — Telehealth: Payer: Self-pay | Admitting: Cardiology

## 2024-06-20 NOTE — Telephone Encounter (Signed)
 Patient stated she has been getting a buzzing in her barostim and she wants to know if there is something she needs to be doing.

## 2024-06-21 NOTE — Telephone Encounter (Signed)
 Lesia Ozell Barter, PA-C to Me  [facility] Triage      06/20/24  9:10 PM   If it is intermittent and positional can continue to follow; She has multiple issues in her neck right now that could be contributing.     If she would like it turned down can be double booked with me next week (in a spot where there are multiple office visits in a row, not device checks)  Left message to call office

## 2024-06-22 NOTE — Telephone Encounter (Signed)
 Left message for pt to call.

## 2024-06-23 ENCOUNTER — Other Ambulatory Visit (HOSPITAL_COMMUNITY): Payer: Self-pay | Admitting: Cardiology

## 2024-06-24 ENCOUNTER — Telehealth: Payer: Self-pay | Admitting: Cardiology

## 2024-06-24 NOTE — Telephone Encounter (Signed)
   Pre-operative Risk Assessment    Patient Name: Karen Warner  DOB: 02/18/1951 MRN: [id number]      Request for Surgical Clearance    Procedure:  CT Myelogram Cervical  Date of Surgery:  Clearance 07/19/24                                 Surgeon:  Dr Celestia Socks Group or Practice Name:  [facility] Phone number:  [phone] Fax number:  [phone]   Type of Clearance Requested:   - Pharmacy:  Hold Clopidogrel  (Plavix ) 3 days before   Type of Anesthesia:  None   Additional requests/questions:  FYI-Pt went to have test done on 06/20/24. As they were working her up she told them is was going to ER when she left there because she was not feeling right. They did not perform test and sent her to ER at [facility]. They treated her for Lyric Toxicity but did not r/s CVA. Pt was in [place] from 7/21 to 7/24.  [name]   06/24/2024, 1:44 PM

## 2024-06-24 NOTE — Telephone Encounter (Signed)
   Name: Karen Warner  DOB: 1951/05/23  MRN: [id number]  Primary Cardiologist: Kardie Tobb, DO   Preoperative team, please contact this patient and set up a phone call appointment for further preoperative risk assessment. Please obtain consent and complete medication review. Patient seen in ED in [facility] for [place] Toxicity but did not r/s CVA. Pt was in [place] from 7/21 to 7/24. Needs to be evaluated prior to clearance.  I confirm that guidance regarding antiplatelet and oral anticoagulation therapy has been completed and, if necessary, noted below.  Per Dr. Sheena 06/15/2024 Per office protocol, if patient is without any new symptoms or concerns at the time of their virtual visit, she may hold Plavix  for 3 days prior to procedure. Please resume Plavix  as soon as possible postprocedure, at the discretion of the surgeon.    I also confirmed the patient resides in the state of [place] . As per [facility] Medical Board telemedicine laws, the patient must reside in the state in which the provider is licensed.   Lamarr Satterfield, NP 06/24/2024, 1:57 PM  [facility]

## 2024-06-27 NOTE — Telephone Encounter (Signed)
 I called and left a message for the pt to call back to schedule a tele preop appt. I will count this as attempt # as the CMA Asberry SAUNDERS, on Friday made a side note lmtcb but did not officially show that she call the pt and noted the chart.

## 2024-06-27 NOTE — Telephone Encounter (Signed)
 [facility] called to check on status of pre-op.  I advised them we are trying to get a hold of patient to get them schedule for appt.

## 2024-06-27 NOTE — Telephone Encounter (Signed)
 I will update the requesting office who just sent request 06/24/24, today is 06/27/24. See notes thatr we have tried to reach the pt today to schedule a tele preop appt. I see procedure is not until 07/19/24. Once we have the pt cleared we will be sure to fax clearance notes to their office.

## 2024-06-29 ENCOUNTER — Telehealth: Payer: Self-pay | Admitting: *Deleted

## 2024-06-29 NOTE — Telephone Encounter (Signed)
 Surgeon office called again to check on status. We still have not been able to reach the pt. If surgeon office s/w the pt please tell the pt to call her cardiologist office [phone] to schedule a tele preop appt.

## 2024-06-29 NOTE — Telephone Encounter (Signed)
 Pt has been scheduled tele preop appt 07/05/24. Med rec and consent are done.     Patient Consent for Virtual Visit        Karen Warner has provided verbal consent on 06/29/2024 for a virtual visit (video or telephone).   CONSENT FOR VIRTUAL VISIT FOR:  Karen Warner Karen Warner  By participating in this virtual visit I agree to the following:  I hereby voluntarily request, consent and authorize [facility] and its employed or contracted physicians, physician assistants, nurse practitioners or other licensed health care professionals (the Practitioner), to provide me with telemedicine health care services (the "Services) as deemed necessary by the treating Practitioner. I acknowledge and consent to receive the Services by the Practitioner via telemedicine. I understand that the telemedicine visit will involve communicating with the Practitioner through live audiovisual communication technology and the disclosure of certain medical information by electronic transmission. I acknowledge that I have been given the opportunity to request an in-person assessment or other available alternative prior to the telemedicine visit and am voluntarily participating in the telemedicine visit.  I understand that I have the right to withhold or withdraw my consent to the use of telemedicine in the course of my care at any time, without affecting my right to future care or treatment, and that the Practitioner or I may terminate the telemedicine visit at any time. I understand that I have the right to inspect all information obtained and/or recorded in the course of the telemedicine visit and may receive copies of available information for a reasonable fee.  I understand that some of the potential risks of receiving the Services via telemedicine include:  Delay or interruption in medical evaluation due to technological equipment failure or disruption; Information transmitted may not be sufficient  (e.g. poor resolution of images) to allow for appropriate medical decision making by the Practitioner; and/or  In rare instances, security protocols could fail, causing a breach of personal health information.  Furthermore, I acknowledge that it is my responsibility to provide information about my medical history, conditions and care that is complete and accurate to the best of my ability. I acknowledge that Practitioner's advice, recommendations, and/or decision may be based on factors not within their control, such as incomplete or inaccurate data provided by me or distortions of diagnostic images or specimens that may result from electronic transmissions. I understand that the practice of medicine is not an exact science and that Practitioner makes no warranties or guarantees regarding treatment outcomes. I acknowledge that a copy of this consent can be made available to me via my patient portal [facility] MyChart), or I can request a printed copy by calling the office of [facility].    I understand that my insurance will be billed for this visit.   I have read or had this consent read to me. I understand the contents of this consent, which adequately explains the benefits and risks of the Services being provided via telemedicine.  I have been provided ample opportunity to ask questions regarding this consent and the Services and have had my questions answered to my satisfaction. I give my informed consent for the services to be provided through the use of telemedicine in my medical care

## 2024-06-29 NOTE — Telephone Encounter (Signed)
 Caller Giles) called to follow-up on the status of patient's clearance.

## 2024-06-29 NOTE — Telephone Encounter (Signed)
 Pt has been scheduled tele preop appt 07/05/24. Med rec and consent are done.

## 2024-07-01 ENCOUNTER — Ambulatory Visit (INDEPENDENT_AMBULATORY_CARE_PROVIDER_SITE_OTHER)

## 2024-07-01 DIAGNOSIS — I5022 Chronic systolic (congestive) heart failure: Secondary | ICD-10-CM | POA: Diagnosis not present

## 2024-07-01 LAB — CUP PACEART REMOTE DEVICE CHECK
Battery Remaining Longevity: 90 mo
Battery Remaining Percentage: 100 %
Brady Statistic RA Percent Paced: 0 %
Brady Statistic RV Percent Paced: 100 %
Date Time Interrogation Session: 20250801031100
HighPow Impedance: 89 Ohm
Implantable Lead Connection Status: 753985
Implantable Lead Connection Status: 753985
Implantable Lead Connection Status: 753985
Implantable Lead Implant Date: 20220131
Implantable Lead Implant Date: 20220131
Implantable Lead Implant Date: 20220131
Implantable Lead Location: 753858
Implantable Lead Location: 753859
Implantable Lead Location: 753860
Implantable Lead Model: 137
Implantable Lead Model: 3830
Implantable Lead Model: 7840
Implantable Lead Serial Number: 1047234
Implantable Lead Serial Number: 301179
Implantable Pulse Generator Implant Date: 20220131
Lead Channel Impedance Value: 534 Ohm
Lead Channel Impedance Value: 545 Ohm
Lead Channel Impedance Value: 821 Ohm
Lead Channel Setting Pacing Amplitude: 0.1 V
Lead Channel Setting Pacing Amplitude: 2.5 V
Lead Channel Setting Pacing Amplitude: 2.5 V
Lead Channel Setting Pacing Pulse Width: 0.1 ms
Lead Channel Setting Pacing Pulse Width: 0.4 ms
Lead Channel Setting Sensing Sensitivity: 0.5 mV
Lead Channel Setting Sensing Sensitivity: 1 mV
Pulse Gen Serial Number: 149649
Zone Setting Status: 755011

## 2024-07-02 ENCOUNTER — Ambulatory Visit: Payer: Self-pay | Admitting: Internal Medicine

## 2024-07-03 NOTE — Progress Notes (Unsigned)
 Pama Roskos [id number] 1951-08-25   Chief Complaint:  Referring Provider: Silver Lamar LABOR, MD Primary GI MD: Dr. Shila  HPI: Xylia Scherger is a 73 y.o. female with past medical history of CHF, COPD, CAD s/p stent on Plavix , ischemic cardiomyopathy s/p biventricular ICD placement,  IDA with intestinal AVMs, diverticular disease status post left hemicolectomy, epiploic appendicitis, colon polyps, gastritis  who presents today for a complaint of *** .    Last seen in office 09/11/2023 by Vina Dasen, NP: Patient has been seen several times in office by multiple providers. She has chronic abdominal bloating, chronically altered bowel habits. Last seen by Delon Failing, PA late August 2024.  She has tried multiple medications for constipation but they either do not work or cause almost instant diarrhea. Previous therapies have included probiotics, miralax , Benefiber, Amitiza , Linzess.  Recently started on Trulance . Called office yesterday with complaints of diarrhea.  Delon Failing, PA recommend she follow-up with Dr. Shila.  Patient did not want to wait and was given an appointment to see  me.    She gets diarrhea within a couple hours of taking Trulance , Linzess or Amitiza .  However, out of all the medications Trulance  does seem to cause the least amount of diarrhea.  She does feel much better after taking a dose because it helps her pass a lot of gas and helps the abdominal bloating.    Starnisha tells me that she gets the urge to have a bowel movement almost every day.  Her problem seems to be that she cannot evacuate her rectum.  If she is not taking something for constipation then her stools are hard.  It seems her stools have to be nearly liquid form for easy passage  Referred to pelvic floor PT for possible pelvic floor dysfunction.   Previous GI Procedures/Imaging   Colonoscopy 10/07/2019 [facility]) Diminutive colon polyps (3) A few  scattered diverticula proximal to the anastomosis No source of active bleeding identified  EGD 10/06/2019 [facility]) Hiatal hernia Asymptomatic 2 cm distal esophageal stricture No source of significant GI blood loss identified  Past Medical History:  Diagnosis Date   AICD (automatic cardioverter/defibrillator) present    Anemia 12/12/2019   Anginal pain (HCC)    Arthralgia 01/14/2020   Arthritis    Asthma    Atherosclerotic heart disease of native coronary artery without angina pectoris 04/13/2014   CHF (congestive heart failure) (HCC)    Chronic bilateral low back pain with bilateral sciatica 01/13/2019   Chronic bladder pain 11/08/2014   Last Assessment & Plan:  Formatting of this note might be different from the original. Patient has chronic pain syndrome in general I think this is unfortunately worsening her pelvic pain and bladder pain her examination today was very reassuring I am advised her to use the estrogen cream I did start her on Elavil 10 milligrams at that time if she does tolerated will increase it to 25 milligrams.    Chronic headaches    Chronic idiopathic constipation 12/22/2014   Formatting of this note might be different from the original. Last Assessment & Plan:  For better bowel emptying please use either Citrucel / Benefiber start with 2 tablespoons daily and titrate up or down to effect.  Also please use glycerin  suppositories as needed to assist in evacuation. Last Assessment & Plan:  Formatting of this note might be different from the original. For better bowel empt   Chronic obstructive pulmonary disease, unspecified (HCC) 03/18/2018  CKD (chronic kidney disease) stage 3, GFR 30-59 ml/min (HCC)    Class 1 obesity due to excess calories with serious comorbidity and body mass index (BMI) of 31.0 to 31.9 in adult 06/26/2020   Colon polyps    COPD (chronic obstructive pulmonary disease) (HCC)    Coronary artery disease    DDD (degenerative disc disease),  cervical 01/13/2019   Depression 11/05/2019   Diabetic polyneuropathy associated with type 2 diabetes mellitus (HCC) 02/24/2019   Diverticulitis 05/20/2018   Diverticulitis of colon 03/18/2018   Family history of ischemic heart disease (IHD) 08/16/2013   Fibromyalgia    Fibromyalgia affecting multiple sites 01/14/2020   Gastro-esophageal reflux disease without esophagitis 01/13/2019   Glaucoma 11/08/2014   Hordeolum externum of left upper eyelid 03/13/2020   Hypertension 11/08/2014   IBS (irritable bowel syndrome)    Iron deficiency anemia 01/13/2019   Left renal mass 11/08/2019   Leukocytosis 05/28/2018   Low vitamin B12 level 03/13/2020   Low vitamin D level 12/12/2019   LV dysfunction 05/31/2019   Malignant essential hypertension 01/22/2016   Migraine, unspecified, not intractable, without status migrainosus 11/08/2014   Mixed hyperlipidemia 01/13/2019   Myocardial infarction (HCC)    Other premature beats 11/08/2014   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 Last Assessment & Plan:  For vaginal atrophy please place a pea size dab of Estrogen vaginal cream  into the vagina 3 times a week ( Monday, Wednesday, Friday) Last Assessment & Plan:  Formatting of this note might be different from the original. For vaginal atrophy please place a pea size dab of Estrogen vaginal cream  into the vagina     Past Surgical History:  Procedure Laterality Date   ABDOMINAL HYSTERECTOMY     BIV ICD INSERTION CRT-D N/A 12/31/2020   Procedure: BIV ICD INSERTION CRT-D;  Surgeon: Waddell Danelle ORN, MD;  Location: [facility]  INVASIVE CV LAB;  Service: Cardiovascular;  Laterality: N/A;   BLEPHAROPLASTY Bilateral    COLON SURGERY     CORONARY ANGIOPLASTY WITH STENT PLACEMENT  2020   X3   CORONARY PRESSURE/FFR STUDY N/A 08/03/2020   Procedure: INTRAVASCULAR PRESSURE WIRE/FFR STUDY;  Surgeon: Wonda Sharper, MD;  Location: [facility] INVASIVE CV LAB;  Service: Cardiovascular;  Laterality: N/A;   CORONARY STENT INTERVENTION N/A 08/03/2020   Procedure: CORONARY STENT INTERVENTION;  Surgeon: Wonda Sharper, MD;  Location: [facility] INVASIVE CV LAB;  Service: Cardiovascular;  Laterality: N/A;   LEFT HEART CATH AND CORONARY ANGIOGRAPHY N/A 08/03/2020   Procedure: LEFT HEART CATH AND CORONARY ANGIOGRAPHY;  Surgeon: Wonda Sharper, MD;  Location: [facility] INVASIVE CV LAB;  Service: Cardiovascular;  Laterality: N/A;   RADIOFREQUENCY ABLATION  07/2023   REFRACTIVE SURGERY Left    [place]    RIGHT/LEFT HEART CATH AND CORONARY ANGIOGRAPHY N/A 10/13/2023   Procedure: RIGHT/LEFT HEART CATH AND CORONARY ANGIOGRAPHY;  Surgeon: Rolan Ezra RAMAN, MD;  Location: [facility] INVASIVE CV LAB;  Service: Cardiovascular;  Laterality: N/A;    Current Outpatient Medications  Medication Sig Dispense Refill   acetaminophen  (TYLENOL ) 650 MG CR tablet Take 650 mg by mouth every 8 (eight) hours as  needed for pain.     albuterol  (PROVENTIL ) (2.5 MG/3ML) 0.083% nebulizer solution Take 2.5 mg by nebulization every 4 (four) hours as needed for wheezing or shortness of breath.     aspirin  81 MG EC tablet Take 81 mg by mouth daily.     BD PEN NEEDLE NANO 2ND GEN 32G X 4 MM MISC 3 (three) times daily.     carvedilol  (COREG ) 3.125 MG tablet Take 1 tablet (3.125 mg total) by mouth 2 (two) times daily with a meal. 180 tablet 2   clopidogrel  (PLAVIX ) 75 MG tablet TAKE 1 TABLET(75 MG) BY MOUTH DAILY 90 tablet 0   Continuous Glucose Sensor (DEXCOM G7 SENSOR) MISC SMARTSIG:1 Topical Every 10 Days     cyclobenzaprine (FLEXERIL) 5 MG tablet Take 5 mg by mouth 3 (three) times daily as  needed.     dapagliflozin  propanediol (FARXIGA ) 10 MG TABS tablet TAKE 1 TABLET(10 MG) BY MOUTH DAILY BEFORE BREAKFAST 90 tablet 3   dicyclomine  (BENTYL ) 10 MG capsule TAKE 1 CAPSULE(10 MG) BY MOUTH IN THE MORNING AND AT BEDTIME 180 capsule 0   digoxin  (LANOXIN ) 0.125 MG tablet TAKE ONE-HALF TABLET BY MOUTH EVERY DAY 45 tablet 1   ezetimibe  (ZETIA ) 10 MG tablet Take 1 tablet (10 mg total) by mouth daily. 30 tablet 6   famotidine  (PEPCID ) 40 MG tablet TAKE 1 TABLET(40 MG) BY MOUTH TWICE DAILY 60 tablet 2   folic acid  (FOLVITE ) 1 MG tablet Take 1 mg by mouth daily.     insulin  lispro (HUMALOG) 100 UNIT/ML KwikPen Inject 2-20 Units into the skin 3 (three) times daily with meals.  per sliding scale     isosorbide  mononitrate (IMDUR ) 30 MG 24 hr tablet Take 15 mg by mouth daily.     lidocaine  (LIDODERM ) 5 % Place 1 patch onto the skin daily. Remove & Discard patch within 12 hours or as directed by MD 5 patch 0   loratadine (CLARITIN) 10 MG tablet Take 10 mg by mouth daily.     losartan  (COZAAR ) 25 MG tablet Take 0.5 tablets (12.5 mg total) by mouth daily. 45 tablet 3   nitroGLYCERIN  (NITROSTAT ) 0.4 MG SL tablet DISSOLVE 1 TABLET UNDER TONGUE EVERY 5 MINUTES AS NEEDED FOR CHEST PAIN. 25 tablet 3   OZEMPIC, 0.25 OR 0.5 MG/DOSE, 2 MG/3ML SOPN Inject 0.5 mg into the skin every Monday.     pantoprazole  (PROTONIX ) 40 MG tablet TAKE 1 TABLET(40 MG) BY MOUTH DAILY 90 tablet 0   Plecanatide  (TRULANCE ) 3 MG TABS Take 1 tablet (3 mg total) by mouth daily. (Patient taking differently: Take 3 mg by mouth as needed.) 30 tablet 2   pregabalin  (LYRICA ) 150 MG capsule Take 150 mg by mouth 2 (two) times daily.     ranolazine  (RANEXA ) 500 MG 12 hr tablet Take 1 tablet (500 mg total) by mouth 2 (two) times daily. 180 tablet 3   rosuvastatin  (CRESTOR ) 40 MG tablet Take 40 mg by mouth at bedtime.     spironolactone  (ALDACTONE ) 25 MG tablet Take 0.5 tablets (12.5 mg total) by mouth daily. 45 tablet 3   torsemide  (DEMADEX )  20 MG tablet Take 1 tablet (20 mg total) by mouth 2 (two) times daily. 40 mg in the morning and 20 mg in the evening, alternating with 20 mg Twice daily 180 tablet 3   TRESIBA FLEXTOUCH 200 UNIT/ML FlexTouch Pen Inject 35 Units into the skin 2 (two) times daily.     TRINTELLIX  5 MG TABS tablet Take  5 mg by mouth at bedtime.     Ubrogepant 50 MG TABS Take 50 mg by mouth daily as needed (Headache).     valACYclovir (VALTREX) 500 MG tablet Take 500 mg by mouth daily.     vitamin B-12 (CYANOCOBALAMIN ) 1000 MCG tablet Take 1,000 mcg by mouth daily.     Vitamin D, Ergocalciferol, (DRISDOL) 1.25 MG (50000 UNIT) CAPS capsule Take 50,000 Units by mouth every 7 (seven) days.     No current facility-administered medications for this visit.    Allergies as of 07/04/2024 - Review Complete 06/13/2024  Allergen Reaction Noted   Bee venom Anaphylaxis 02/01/2018   Sumatriptan Shortness Of Breath 11/02/2020   Amoxicillin-pot clavulanate Diarrhea and Nausea And Vomiting 11/08/2014   Oxycodone Itching and Hives 11/08/2014   Buprenorphine hcl  04/19/2012   Clarithromycin Diarrhea 04/19/2012   Duloxetine  hcl Other (See Comments) 04/19/2012   Hydrocodone Itching 04/19/2012   Lactose  05/26/2018   Liraglutide Other (See Comments) 04/19/2012   Tramadol hcl Itching 04/19/2012    Family History  Problem Relation Age of Onset   Lung cancer Mother    Coronary artery disease Mother    Diabetes Mother    Glaucoma Mother    Hypertension Mother    Colon polyps Mother    Migraines Father    Heart disease Father    Diabetes Maternal Grandmother    Diabetes Daughter    Migraines Son    Hypertension Brother     Social History   Tobacco Use   Smoking status: Former    Current packs/day: 0.00    Types: Cigarettes    Quit date: 2021    Years since quitting: 4.5   Smokeless tobacco: Never  Vaping Use   Vaping status: Never Used  Substance Use Topics   Alcohol use: Never   Drug use: Never     Review  of Systems:    Constitutional: No weight loss, fever, chills, weakness or fatigue Eyes: No change in vision Ears, Nose, Throat:  No change in hearing or congestion Skin: No rash or itching Cardiovascular: No chest pain, chest pressure or palpitations   Respiratory: No SOB or cough Gastrointestinal: See HPI and otherwise negative Genitourinary: No dysuria or change in urinary frequency Neurological: No headache, dizziness or syncope Musculoskeletal: No new muscle or joint pain Hematologic: No bleeding or bruising    Physical Exam:  Vital signs: There were no vitals taken for this visit.  Constitutional: NAD, Well developed, Well nourished, alert and cooperative Head:  Normocephalic and atraumatic.  Eyes: No scleral icterus. Conjunctiva pink. Mouth: No oral lesions. Respiratory: Respirations even and unlabored. Lungs clear to auscultation bilaterally.  No wheezes, crackles, or rhonchi.  Cardiovascular:  Regular rate and rhythm. No murmurs. No peripheral edema. Gastrointestinal:  Soft, nondistended, nontender. No rebound or guarding. Normal bowel sounds. No appreciable masses or hepatomegaly. Rectal:  Not performed.  Neurologic:  Alert and oriented x4;  grossly normal neurologically.  Skin:   Dry and intact without significant lesions or rashes. Psychiatric: Oriented to person, place and time. Demonstrates good judgement and reason without abnormal affect or behaviors.   RELEVANT LABS AND IMAGING: CBC    Component Value Date/Time   WBC 10.3 05/26/2024 1433   WBC 8.2 01/11/2024 1428   RBC 4.27 05/26/2024 1433   HGB 12.2 05/26/2024 1433   HGB 12.8 06/16/2023 1441   HCT 38.7 05/26/2024 1433   HCT 39.3 06/16/2023 1441   PLT 292 05/26/2024 1433   MCV  90.6 05/26/2024 1433   MCV 89 06/16/2023 1441   MCV 86 02/14/2021 0000   MCH 28.6 05/26/2024 1433   MCHC 31.5 05/26/2024 1433   RDW 15.6 (H) 05/26/2024 1433   RDW 13.5 06/16/2023 1441   LYMPHSABS 3.0 05/26/2024 1433   LYMPHSABS  2.0 06/16/2023 1441   MONOABS 0.8 05/26/2024 1433   EOSABS 0.2 05/26/2024 1433   EOSABS 0.2 06/16/2023 1441   BASOSABS 0.1 05/26/2024 1433   BASOSABS 0.0 06/16/2023 1441    CMP     Component Value Date/Time   NA 143 06/07/2024 1429   NA 142 06/16/2023 1441   K 3.3 (L) 06/07/2024 1429   CL 105 06/07/2024 1429   CO2 24 06/07/2024 1429   GLUCOSE 99 06/07/2024 1429   BUN 26 (H) 06/07/2024 1429   BUN 16 06/16/2023 1441   CREATININE 1.47 (H) 06/07/2024 1429   CREATININE 1.42 (H) 05/26/2024 1433   CALCIUM  8.9 06/07/2024 1429   PROT 7.0 05/26/2024 1433   PROT 7.4 02/26/2023 1234   ALBUMIN 4.0 05/26/2024 1433   ALBUMIN 4.1 02/26/2023 1234   AST 23 05/26/2024 1433   ALT 17 05/26/2024 1433   ALKPHOS 76 05/26/2024 1433   BILITOT 0.3 05/26/2024 1433   GFRNONAA 37 (L) 06/07/2024 1429   GFRNONAA 39 (L) 05/26/2024 1433   GFRAA 45 (L) 01/16/2021 1226   Echocardiogram 07/10/2023 1. Left ventricular ejection fraction, by estimation, is < 20% . The left ventricle has severely decreased function. The left ventricle has no regional wall motion abnormalities. The left ventricular internal cavity size was moderately dilated. There is mild left ventricular hypertrophy. Left ventricular diastolic parameters are consistent with Grade I diastolic dysfunction ( impaired relaxation) .  2. Right ventricular systolic function is normal. The right ventricular size is normal. There is normal pulmonary artery systolic pressure.  3. The mitral valve is normal in structure. Mild mitral valve regurgitation. No evidence of mitral stenosis.  4. The aortic valve is normal in structure. Aortic valve regurgitation is not visualized. No aortic stenosis is present.  5. The inferior vena cava is normal in size with greater than 50% respiratory variability, suggesting right atrial pressure of 3 mmHg.  Assessment/Plan:       Camie Furbish, PA-C [facility] 07/03/2024, 7:56 PM  Patient Care Team: Silver Lamar LABOR, MD as PCP - General (Family Medicine) Tobb, Kardie, DO as PCP - Cardiology (Cardiology) Waddell Danelle ORN, MD as PCP - Electrophysiology (Cardiology)

## 2024-07-04 ENCOUNTER — Encounter: Payer: Self-pay | Admitting: Gastroenterology

## 2024-07-04 ENCOUNTER — Encounter: Payer: Self-pay | Admitting: Podiatry

## 2024-07-04 ENCOUNTER — Ambulatory Visit (INDEPENDENT_AMBULATORY_CARE_PROVIDER_SITE_OTHER): Admitting: Podiatry

## 2024-07-04 ENCOUNTER — Telehealth: Payer: Self-pay

## 2024-07-04 ENCOUNTER — Other Ambulatory Visit: Payer: Self-pay | Admitting: Cardiology

## 2024-07-04 ENCOUNTER — Ambulatory Visit (INDEPENDENT_AMBULATORY_CARE_PROVIDER_SITE_OTHER): Admitting: Gastroenterology

## 2024-07-04 VITALS — BP 112/64 | HR 69 | Ht 65.0 in | Wt 183.0 lb

## 2024-07-04 DIAGNOSIS — R140 Abdominal distension (gaseous): Secondary | ICD-10-CM | POA: Diagnosis not present

## 2024-07-04 DIAGNOSIS — M79675 Pain in left toe(s): Secondary | ICD-10-CM

## 2024-07-04 DIAGNOSIS — L600 Ingrowing nail: Secondary | ICD-10-CM | POA: Diagnosis not present

## 2024-07-04 DIAGNOSIS — M79674 Pain in right toe(s): Secondary | ICD-10-CM

## 2024-07-04 DIAGNOSIS — I200 Unstable angina: Secondary | ICD-10-CM

## 2024-07-04 DIAGNOSIS — Z95810 Presence of automatic (implantable) cardiac defibrillator: Secondary | ICD-10-CM

## 2024-07-04 DIAGNOSIS — E1142 Type 2 diabetes mellitus with diabetic polyneuropathy: Secondary | ICD-10-CM | POA: Diagnosis not present

## 2024-07-04 DIAGNOSIS — K5909 Other constipation: Secondary | ICD-10-CM | POA: Diagnosis not present

## 2024-07-04 DIAGNOSIS — K5904 Chronic idiopathic constipation: Secondary | ICD-10-CM

## 2024-07-04 DIAGNOSIS — I739 Peripheral vascular disease, unspecified: Secondary | ICD-10-CM | POA: Diagnosis not present

## 2024-07-04 DIAGNOSIS — Z860100 Personal history of colon polyps, unspecified: Secondary | ICD-10-CM

## 2024-07-04 DIAGNOSIS — K219 Gastro-esophageal reflux disease without esophagitis: Secondary | ICD-10-CM | POA: Diagnosis not present

## 2024-07-04 DIAGNOSIS — Z9049 Acquired absence of other specified parts of digestive tract: Secondary | ICD-10-CM

## 2024-07-04 DIAGNOSIS — B351 Tinea unguium: Secondary | ICD-10-CM | POA: Diagnosis not present

## 2024-07-04 NOTE — Telephone Encounter (Signed)
 Karen Heck,  Ms. Lebron was seen by you in clinic on 06/13/2024.  She was doing well at that time and remained stable from a cardiac standpoint.  She is now requesting preoperative cardiac evaluation for colonoscopy.  Would you be able to provide recommendations on cardiac risk for her upcoming procedure?  Thank you for your help.  Please direct your response to CV DIV preop pool.  Josefa HERO. Lelah Rennaker NP-C     07/04/2024, 1:50 PM [facility] [address] Office [phone] Fax [phone]

## 2024-07-04 NOTE — Telephone Encounter (Signed)
 [facility] Pre-operative Risk Assessment     Request for surgical clearance:     Endoscopy Procedure  What type of surgery is being performed?     Colonoscopy  When is this surgery scheduled?     TBD  What type of clearance is required ?   Pharmacy and Cardiac  Are there any medications that need to be held prior to surgery and how long? Plavix  x 5 days  Practice name and name of physician performing surgery?      [facility]  What is your office phone and fax number?      Phone- [phone]  Fax- [phone]  Anesthesia type (None, local, MAC, general) ?       MAC   Please route your response to Alan Fusi, CMA

## 2024-07-04 NOTE — Progress Notes (Signed)
  Subjective:  Patient ID: Karen Warner, female    DOB: 12-10-1950,  MRN: [id number]  Chief Complaint  Patient presents with   [facility]    [facility] with out callous. Last A1c was 7 in April and takes Plavix .     73 y.o. female presents with the above complaint. History confirmed with patient. Patient presenting with pain related to dystrophic thickened elongated nails. Patient is unable to trim own nails related to nail dystrophy and/or mobility issues. Patient does have a history of T2DM.  Last A1c about 7.  She does continue to endorse pain to the right first toenail. Reports dealing with chronic pain. Does have upcoming appointment with vascular surgery later on in September for PAD.  Objective:  Physical Exam: warm, capillary refill about 5 seconds to the digits of the right foot, 3 to 5 seconds to the digits of the left foot.  Diminished pedal hair growth, pedal skin atrophic, purpuric lesions noted to posterior heel. nail exam onychomycosis of the toenails, onycholysis, and dystrophic nails Right foot DP and PT pulses difficult to palpate, left foot DP and PT pulses faintly palpable, and protective sensation decreased Left Foot:  Pain with palpation of nails due to elongation and dystrophic growth.  Right Foot: Pain with palpation of nails due to elongation and dystrophic growth.  Right hallux lateral nail border tender on palpation, no signs of infection, no erythema, no edema.  Assessment:   1. Ingrown nail   2. PAD (peripheral artery disease) (HCC)   3. Pain due to onychomycosis of toenails of both feet   4. Diabetic polyneuropathy associated with type 2 diabetes mellitus (HCC)      Plan:  Patient was evaluated and treated and all questions answered.  #Onychomycosis with pain  -Nails palliatively debrided as below. -Educated on self-care  Procedure: Nail Debridement Rationale: Pain Type of Debridement: manual, sharp debridement. Instrumentation: Nail nipper, rotary  burr. Number of Nails: 10  # Diabetes with neuropathy # PVD # Ingrown toenail right first toe -Patient educated on diabetes. Discussed proper diabetic foot care and discussed risks and complications of disease. Educated patient in depth on reasons to return to the office immediately should he/she discover anything concerning or new on the feet. All questions answered. Discussed proper shoes as well.  - Did review ABI results with patient showing moderate disease right lower extremity, mild disease left lower extremity. - Does have upcoming evaluation with vascular surgery - Her PAD at this point precludes PNA procedure for her ingrown toenail. -Did palliatively trim the right hallux lateral border with sterile nail nipper today without incident.  Monitor for signs of recurrence.  Recommend twice daily Epsom salt soaks and keeping Neosporin applied to the nail border for 1 to 2 weeks.  Monitor for signs of infection.  Follow-up if symptoms worsen -I certify that this diagnosis represents a distinct and separate diagnosis that requires evaluation and treatment separate from other procedures or diagnosis   Return in about 9 weeks (around 09/05/2024) for Diabetic Foot Care.         Ethan Saddler, DPM [facility]

## 2024-07-04 NOTE — Telephone Encounter (Signed)
 See cardiology clearance note Camie.

## 2024-07-04 NOTE — Telephone Encounter (Addendum)
     Primary Cardiologist: Kardie Tobb, DO  Chart reviewed as part of pre-operative protocol coverage. Given past medical history and time since last visit, based on ACC/AHA guidelines, Karen Warner would be at acceptable risk for the planned procedure without further cardiovascular testing.   Patient is low risk for planned procedure.  Patient's Plavix  may be held for 5 days prior to procedure.  Regarding ASA therapy, we recommend continuation of ASA throughout the perioperative period. However, if the surgeon feels that cessation of ASA is required in the perioperative period, it may be stopped 5-7 days prior to surgery with a plan to resume it as soon as felt to be feasible from a surgical standpoint in the post-operative period.    I will route this recommendation to the requesting party via Epic fax function and remove from pre-op pool.  Please call with questions.  Josefa HERO. Sissy Goetzke NP-C     07/04/2024, 2:48 PM [facility] [address] Office [phone] Fax [phone]

## 2024-07-04 NOTE — Patient Instructions (Signed)
 We will obtain your records from [facility] from your recent hospitalization.   We will also obtain cardiac clearance for your colonoscopy in November that is not scheduled yet.  _______________________________________________________  If your blood pressure at your visit was 140/90 or greater, please contact your primary care physician to follow up on this.  _______________________________________________________  If you are age 41 or older, your body mass index should be between 23-30. Your Body mass index is 30.45 kg/m. If this is out of the aforementioned range listed, please consider follow up with your Primary Care Provider.  If you are age 14 or younger, your body mass index should be between 19-25. Your Body mass index is 30.45 kg/m. If this is out of the aformentioned range listed, please consider follow up with your Primary Care Provider.   ________________________________________________________  The [facility] providers would like to encourage you to use MYCHART to communicate with providers for non-urgent requests or questions.  Due to long hold times on the telephone, sending your provider a message by [facility] may be a faster and more efficient way to get a response.  Please allow 48 business hours for a response.  Please remember that this is for non-urgent requests.  _______________________________________________________  [facility] is using a team-based approach to care.  Your team is made up of your doctor and two to three APPS. Our APPS (Nurse Practitioners and Physician Assistants) work with your physician to ensure care continuity for you. They are fully qualified to address your health concerns and develop a treatment plan. They communicate directly with your gastroenterologist to care for you. Seeing the Advanced Practice Practitioners on your physician's team can help you by facilitating care more promptly, often allowing for earlier appointments,  access to diagnostic testing, procedures, and other specialty referrals.

## 2024-07-05 ENCOUNTER — Ambulatory Visit: Attending: Cardiovascular Disease

## 2024-07-05 DIAGNOSIS — Z01810 Encounter for preprocedural cardiovascular examination: Secondary | ICD-10-CM | POA: Diagnosis not present

## 2024-07-05 NOTE — Progress Notes (Signed)
 Virtual Visit via Telephone Note   Because of Karen Warner co-morbid illnesses, she is at least at moderate risk for complications without adequate follow up.  This format is felt to be most appropriate for this patient at this time.  Due to technical limitations with video connection (technology), today's appointment will be conducted as an audio only telehealth visit, and [name] verbally agreed to proceed in this manner.   All issues noted in this document were discussed and addressed.  No physical exam could be performed with this format.  Evaluation Performed:  Preoperative cardiovascular risk assessment _____________   Date:  07/05/2024   Patient ID:  Karen Warner Karen Warner, DOB [birth year], MRN [id number] Patient Location:  Home Provider location:   Office  Primary Care Provider:  Silver Lamar LABOR, MD Primary Cardiologist:  Kardie Tobb, DO  Chief Complaint / Patient Profile   73 y.o. y/o female with a h/o hypertension, systolic CHF, CAD who is pending CT Myelogram Cervica and presents today for telephonic preoperative cardiovascular risk assessment.  History of Present Illness    [name] is a 73 y.o. female who presents via audio/video conferencing for a telehealth visit today.  Pt was last seen in cardiology clinic on 06/13/2024 by Jodie Passey, PA-C.  At that time Jaidynn Balster was doing well .  The patient is now pending procedure as outlined above. Since her last visit, she continues to be stable from a cardiac standpoint.  Today she denies  shortness of breath, lower extremity edema, fatigue, palpitations, melena, hematuria, hemoptysis, diaphoresis, weakness, presyncope, syncope, orthopnea, and PND.   Past Medical History    Past Medical History:  Diagnosis Date   AICD (automatic cardioverter/defibrillator) present    Anemia 12/12/2019   Anginal pain (HCC)    Arthralgia 01/14/2020   Arthritis    Asthma     Atherosclerotic heart disease of native coronary artery without angina pectoris 04/13/2014   CHF (congestive heart failure) (HCC)    Chronic bilateral low back pain with bilateral sciatica 01/13/2019   Chronic bladder pain 11/08/2014   Last Assessment & Plan:  Formatting of this note might be different from the original. Patient has chronic pain syndrome in general I think this is unfortunately worsening her pelvic pain and bladder pain her examination today was very reassuring I am advised her to use the estrogen cream I did start her on Elavil 10 milligrams at that time if she does tolerated will increase it to 25 milligrams.    Chronic headaches    Chronic idiopathic constipation 12/22/2014   Formatting of this note might be different from the original. Last Assessment & Plan:  For better bowel emptying please use either Citrucel / Benefiber start with 2 tablespoons daily and titrate up or down to effect.  Also please use glycerin  suppositories as needed to assist in evacuation. Last Assessment & Plan:  Formatting of this note might be different from the original. For better bowel empt   Chronic obstructive pulmonary disease, unspecified (HCC) 03/18/2018   CKD (chronic kidney disease) stage 3, GFR 30-59 ml/min (HCC)    Class 1 obesity due to excess calories with serious comorbidity and body mass index (BMI) of 31.0 to 31.9 in adult 06/26/2020   Colon polyps    COPD (chronic obstructive pulmonary disease) (HCC)    Coronary artery disease    DDD (degenerative disc disease), cervical 01/13/2019   Depression 11/05/2019   Diabetic polyneuropathy  associated with type 2 diabetes mellitus (HCC) 02/24/2019   Diverticulitis 05/20/2018   Diverticulitis of colon 03/18/2018   Family history of ischemic heart disease (IHD) 08/16/2013   Fibromyalgia    Fibromyalgia affecting multiple sites 01/14/2020   Gastro-esophageal reflux disease without esophagitis 01/13/2019   Glaucoma 11/08/2014   Hordeolum  externum of left upper eyelid 03/13/2020   Hypertension 11/08/2014   IBS (irritable bowel syndrome)    Iron deficiency anemia 01/13/2019   Left renal mass 11/08/2019   Leukocytosis 05/28/2018   Low vitamin B12 level 03/13/2020   Low vitamin D level 12/12/2019   LV dysfunction 05/31/2019   Malignant essential hypertension 01/22/2016   Migraine, unspecified, not intractable, without status migrainosus 11/08/2014   Mixed hyperlipidemia 01/13/2019   Myocardial infarction (HCC)    Other premature beats 11/08/2014   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 Last Assessment & Plan:  For vaginal atrophy please place a pea size dab of Estrogen vaginal cream  into the vagina 3 times a week ( Monday, Wednesday, Friday) Last Assessment & Plan:  Formatting of this note might be different from the original. For vaginal atrophy please place a pea size dab of Estrogen vaginal cream  into the vagina    Past Surgical History:  Procedure Laterality Date   ABDOMINAL HYSTERECTOMY     BIV ICD INSERTION CRT-D N/A 12/31/2020   Procedure: BIV ICD INSERTION CRT-D;  Surgeon: Waddell Danelle ORN, MD;  Location: [facility] INVASIVE CV LAB;  Service: Cardiovascular;  Laterality: N/A;   BLEPHAROPLASTY Bilateral    COLON SURGERY     CORONARY ANGIOPLASTY WITH STENT PLACEMENT  2020   X3   CORONARY PRESSURE/FFR STUDY N/A 08/03/2020   Procedure: INTRAVASCULAR PRESSURE WIRE/FFR STUDY;  Surgeon: Wonda Sharper, MD;  Location: [facility] INVASIVE CV LAB;  Service: Cardiovascular;  Laterality: N/A;   CORONARY STENT INTERVENTION N/A 08/03/2020   Procedure:  CORONARY STENT INTERVENTION;  Surgeon: Wonda Sharper, MD;  Location: [facility] INVASIVE CV LAB;  Service: Cardiovascular;  Laterality: N/A;   LEFT HEART CATH AND CORONARY ANGIOGRAPHY N/A 08/03/2020   Procedure: LEFT HEART CATH AND CORONARY ANGIOGRAPHY;  Surgeon: Wonda Sharper, MD;  Location: [facility] INVASIVE CV LAB;  Service: Cardiovascular;  Laterality: N/A;   RADIOFREQUENCY ABLATION  07/2023   REFRACTIVE SURGERY Left    [place]    RIGHT/LEFT HEART CATH AND CORONARY ANGIOGRAPHY N/A 10/13/2023   Procedure: RIGHT/LEFT HEART CATH AND CORONARY ANGIOGRAPHY;  Surgeon: Rolan Ezra RAMAN, MD;  Location: [facility] INVASIVE CV LAB;  Service: Cardiovascular;  Laterality: N/A;    Allergies  Allergies  Allergen Reactions   Bee Venom Anaphylaxis   Sumatriptan Shortness Of Breath    Migraine worsened   Amoxicillin-Pot Clavulanate Diarrhea and Nausea And Vomiting    GI Intolerance   Oxycodone Itching and Hives    Other reaction(s): Other (see comments) Funny feeling in head  off the edge    Buprenorphine Hcl     Other reaction(s): Itching   Clarithromycin Diarrhea    Abdominal pain   Duloxetine  Hcl Other (See Comments)    drowsiness   Hydrocodone Itching   Lactose  Upset stomach, gas/bloating    Liraglutide Other (See Comments)    Abdominal discomfort   Tramadol Hcl Itching    Home Medications    Prior to Admission medications   Medication Sig Start Date End Date Taking? Authorizing Provider  acetaminophen  (TYLENOL ) 650 MG CR tablet Take 650 mg by mouth every 8 (eight) hours as needed for pain.    [provider]  albuterol  (PROVENTIL ) (2.5 MG/3ML) 0.083% nebulizer solution Take 2.5 mg by nebulization every 4 (four) hours as needed for wheezing or shortness of breath. 01/26/18   [provider]  aspirin  81 MG EC tablet Take 81 mg by mouth daily.    [provider]  BD PEN NEEDLE NANO 2ND GEN 32G X 4 MM MISC 3 (three) times daily. 04/20/23   [provider]   carvedilol  (COREG ) 3.125 MG tablet Take 1 tablet (3.125 mg total) by mouth 2 (two) times daily with a meal. 09/28/23   Tobb, Kardie, DO  clopidogrel  (PLAVIX ) 75 MG tablet TAKE 1 TABLET(75 MG) BY MOUTH DAILY 04/08/24   Tobb, Kardie, DO  Continuous Glucose Sensor (DEXCOM G7 SENSOR) MISC SMARTSIG:1 Topical Every 10 Days 03/12/23   [provider]  cyclobenzaprine (FLEXERIL) 5 MG tablet Take 5 mg by mouth 3 (three) times daily as needed. 06/23/24   [provider]  dapagliflozin  propanediol (FARXIGA ) 10 MG TABS tablet TAKE 1 TABLET(10 MG) BY MOUTH DAILY BEFORE BREAKFAST 07/01/23   Tobb, Kardie, DO  dicyclomine  (BENTYL ) 10 MG capsule TAKE 1 CAPSULE(10 MG) BY MOUTH IN THE MORNING AND AT BEDTIME 06/06/24   Nandigam, Kavitha V, MD  digoxin  (LANOXIN ) 0.125 MG tablet TAKE ONE-HALF TABLET BY MOUTH EVERY DAY 06/23/24   Rolan Ezra RAMAN, MD  ezetimibe  (ZETIA ) 10 MG tablet Take 1 tablet (10 mg total) by mouth daily. 05/25/24 08/23/24  Rolan Ezra RAMAN, MD  famotidine  (PEPCID ) 40 MG tablet TAKE 1 TABLET(40 MG) BY MOUTH TWICE DAILY 02/06/23   Beather Delon Gibson, PA  folic acid  (FOLVITE ) 1 MG tablet Take 1 mg by mouth daily. 05/29/21   [provider]  insulin  lispro (HUMALOG) 100 UNIT/ML KwikPen Inject 2-20 Units into the skin 3 (three) times daily with meals.  per sliding scale 09/22/16   [provider]  isosorbide  mononitrate (IMDUR ) 30 MG 24 hr tablet Take 15 mg by mouth daily. 05/30/24   [provider]  lidocaine  (LIDODERM ) 5 % Place 1 patch onto the skin daily. Remove & Discard patch within 12 hours or as directed by MD 07/16/21   Tobb, Kardie, DO  loratadine (CLARITIN) 10 MG tablet Take 10 mg by mouth daily. 09/19/20   [provider]  losartan  (COZAAR ) 25 MG tablet Take 0.5 tablets (12.5 mg total) by mouth daily. 05/04/24 08/02/24  Glena Harlene HERO, FNP  nitroGLYCERIN  (NITROSTAT ) 0.4 MG SL tablet DISSOLVE 1 TABLET UNDER TONGUE EVERY 5 MINUTES AS NEEDED FOR CHEST  PAIN. 08/24/23   Tobb, Kardie, DO  OZEMPIC, 0.25 OR 0.5 MG/DOSE, 2 MG/3ML SOPN Inject 0.5 mg into the skin every Monday. 10/28/23   [provider]  pantoprazole  (PROTONIX ) 40 MG tablet TAKE 1 TABLET(40 MG) BY MOUTH DAILY 04/08/24   Tobb, Kardie, DO  Plecanatide  (TRULANCE ) 3 MG TABS Take 1 tablet (3 mg total) by mouth daily. Patient taking differently: Take 3 mg by mouth as needed. 07/30/23   Beather Delon Gibson, PA  ranolazine  (RANEXA ) 500 MG 12 hr tablet Take 1 tablet (500 mg total) by mouth 2 (two) times daily.  05/11/24   Tobb, Kardie, DO  rosuvastatin  (CRESTOR ) 40 MG tablet Take 40 mg by mouth at bedtime.    [provider]  spironolactone  (ALDACTONE ) 25 MG tablet Take 0.5 tablets (12.5 mg total) by mouth daily. 06/07/24 09/05/24  Glena Harlene HERO, FNP  torsemide  (DEMADEX ) 20 MG tablet Take 1 tablet (20 mg total) by mouth 2 (two) times daily. 40 mg in the morning and 20 mg in the evening, alternating with 20 mg Twice daily 06/07/24   [place], Harlene HERO, FNP  TRESIBA FLEXTOUCH 200 UNIT/ML FlexTouch Pen Inject 35 Units into the skin 2 (two) times daily. 06/29/20   [provider]  TRINTELLIX  5 MG TABS tablet Take 5 mg by mouth at bedtime. 08/14/20   [provider]  Ubrogepant 50 MG TABS Take 50 mg by mouth daily as needed (Headache). 04/07/23   [provider]  valACYclovir (VALTREX) 500 MG tablet Take 500 mg by mouth daily. 04/10/23   [provider]  vitamin B-12 (CYANOCOBALAMIN ) 1000 MCG tablet Take 1,000 mcg by mouth daily.    [provider]  Vitamin D, Ergocalciferol, (DRISDOL) 1.25 MG (50000 UNIT) CAPS capsule Take 50,000 Units by mouth every 7 (seven) days.    [provider]    Physical Exam    Vital Signs:  Neshia Mckenzie does not have vital signs available for review today.   Given telephonic nature of communication, physical exam is limited. AAOx3. NAD. Normal affect.  Speech and respirations are  unlabored.  Accessory Clinical Findings    None  Assessment & Plan    1.  Preoperative Cardiovascular Risk Assessment:CT Myelogram Cervical   Date of Surgery:  Clearance 07/19/24                                 Surgeon:  Dr Celestia Socks Group or Practice Name:  [facility] Phone number:  [phone] Fax number:  [phone]      Primary Cardiologist: Kardie Tobb, DO  Chart reviewed as part of pre-operative protocol coverage. Given past medical history and time since last visit, based on ACC/AHA guidelines, Raeleen Winstanley would be at acceptable risk for the planned procedure without further cardiovascular testing.   Patient was advised that if she develops new symptoms prior to surgery to contact our office to arrange a follow-up appointment.  He verbalized understanding.  She may hold Plavix  for 3 days prior to procedure. Please resume Plavix  as soon as possible postprocedure, at the discretion of the surgeon.   I will route this recommendation to the requesting party via Epic fax function and remove from pre-op pool.       Time:   Today, I have spent 8 minutes with the patient with telehealth technology discussing medical history, symptoms, and management plan.  I spent 10 minutes reviewing patient's past cardiac history and cardiac medications.    Josefa HERO [place], NP  07/05/2024, 7:18 AM

## 2024-07-07 ENCOUNTER — Other Ambulatory Visit: Payer: Self-pay

## 2024-07-07 MED ORDER — PANTOPRAZOLE SODIUM 40 MG PO TBEC: 40.0000 mg | DELAYED_RELEASE_TABLET | Freq: Every day | ORAL | 2 refills | Status: AC

## 2024-07-07 MED ORDER — CLOPIDOGREL BISULFATE 75 MG PO TABS: 75.0000 mg | ORAL_TABLET | Freq: Every day | ORAL | 2 refills | Status: AC

## 2024-07-18 ENCOUNTER — Telehealth: Payer: Self-pay

## 2024-07-18 NOTE — Telephone Encounter (Signed)
 Shoe order was cancelled as we could not get [employer] signed by  Treating Dr. Patient was called  Patient can pick up ppw to take to treating Dr to have signed she is aware it has to be MD / DO signature  Karen Warner

## 2024-07-20 ENCOUNTER — Telehealth: Payer: Self-pay

## 2024-07-20 NOTE — Telephone Encounter (Signed)
 Pt called c/o vibration in her ear, neck and jaw from the Barostim device.  She saw Ozell Passey, [place] in July and he didn't change the settings on the device. She says the vibration is gradually getting worse.  No trouble breathing or swallowing.   Sent message to Dr. Serene to advise.

## 2024-07-22 ENCOUNTER — Telehealth: Payer: Self-pay

## 2024-07-22 NOTE — Telephone Encounter (Signed)
 I will update the requesting office the pt has a 61month f/u with DR. Rolan that will need to be done before the pt can be cleared per preop team.

## 2024-07-22 NOTE — Telephone Encounter (Signed)
   Pre-operative Risk Assessment    Patient Name: Karen Warner  DOB: June 27, 1951 MRN: [id number]   Date of last office visit: 06/13/24 Date of next office visit: 09/01/24   Request for Surgical Clearance    Procedure:  C7-T1 IL EST  Date of Surgery:  Clearance TBD                                Surgeon:  Not provided Surgeon's Group or Practice Name:  [facility] Phone number:  [phone] Fax number:  [phone]   Type of Clearance Requested:   - Medical  - Pharmacy:  Hold Clopidogrel  (Plavix ) x 7 days   Type of Anesthesia:  General    Additional requests/questions:    Bonney Ival LOISE Gerome   07/22/2024, 3:07 PM

## 2024-07-22 NOTE — Telephone Encounter (Signed)
   Name: Karen Warner  DOB: [birth year]  MRN: [id number]  Primary Cardiologist: Kardie Tobb, DO  Chart reviewed as part of pre-operative protocol coverage. The patient has an upcoming visit scheduled with Dr. Rolan on 09/01/2024 at which time clearance can be addressed in case there are any issues that would impact surgical recommendations.  C7-T1 IL EST Is not scheduled until TBD as below. I added preop FYI to appointment note so that provider is aware to address at time of outpatient visit.  Per office protocol the cardiology provider should forward their finalized clearance decision and recommendations regarding antiplatelet therapy to the requesting party below.    This message will also be routed to pharmacy pool and/or Dr Rolan  for input on holding Plavix  for 7 days as requested below so that this information is available to the clearing provider at time of patient's appointment.   I will route this message as FYI to requesting party and remove this message from the preop box as separate preop APP input not needed at this time.   Please call with any questions.  Lamarr Satterfield, NP  07/22/2024, 3:15 PM

## 2024-07-25 ENCOUNTER — Telehealth: Payer: Self-pay | Admitting: Internal Medicine

## 2024-07-25 ENCOUNTER — Telehealth: Payer: Self-pay

## 2024-07-25 ENCOUNTER — Encounter: Payer: Self-pay | Admitting: Cardiology

## 2024-07-25 NOTE — Telephone Encounter (Signed)
 Left message for patient, we are waiting to hear but the device does not vibrate.

## 2024-07-25 NOTE — Telephone Encounter (Signed)
  Response from Dr. Serene re: vibration in neck, ear and head  RE: Barostim vibration Received: 2 days ago Serene Gaile ORN, MD  Addie Lolita DEL, RN Should be handled by Jodie and the device team.  My only option is for device removal       Previous Messages    ----- Message ----- From: Addie Lolita DEL, RN Sent: 07/20/2024   2:27 PM EDT To: Gaile ORN Serene, MD Subject: Barostim vibration                            Pt called c/o vibration in her ear, neck and jaw from the Barostim device.  She saw Ozell Passey, [place] in July and he didn't change the settings on the device. She says the vibration is gradually getting worse.  No trouble breathing or swallowing.  Please advise.  Thanks, Lolita

## 2024-07-25 NOTE — Telephone Encounter (Signed)
 Error

## 2024-07-25 NOTE — Telephone Encounter (Signed)
 Patient says she feels barostim vibrating in her chest and head. Please advise.

## 2024-07-25 NOTE — Telephone Encounter (Signed)
 Patient called again to follow-up on next steps regarding the vibrating in her chest and head.

## 2024-07-25 NOTE — Telephone Encounter (Signed)
 Patient called to follow-up on the status of her clearance and wants to have the visit moved up as she is in a lot of pain.

## 2024-07-25 NOTE — Telephone Encounter (Signed)
 Patient following up regarding clearance. Would like to know if appointment for clearance can be moved up. Please advise.

## 2024-07-25 NOTE — Telephone Encounter (Addendum)
 Will forward to John Sevier to advise further.  Confirmed with Joey, BSX rep that patients ICD Momentum does not vibrate.

## 2024-07-26 NOTE — Telephone Encounter (Signed)
 Patient is needing a preop clearance patient is scheduled to see Dr. Rolan on October but patient called our office wanting to be seen sooner due to increase pain tried contacting patient no answer left a detailed vm to reach out to Dr. Rolan office to see if he has any sooner openings than what she is scheduled for.

## 2024-07-26 NOTE — Telephone Encounter (Signed)
 Tried contacting patient back she is scheduled to see Dr. Rolan on 10/2 but patient is wanting to be seen sooner she has an appt upcoming with Jodie Passey, PA on 9/2 I left a detailed vm to patient to reach out to Dr. Rolan office to see if she is able to get in sooner and I will send a message to Dr.McLean office

## 2024-07-28 NOTE — Telephone Encounter (Signed)
 Call placed to [facility] to clarify that the procedure is for an epidural steroid injection (ESI).   The patient has a BSX CRT-D and a Barostim. She is scheduled to come into the office on 08/02/24 to see Jodie Passey, PA regarding her Barostim device.   Will await completion of this appointment prior to clearing the patient's CRT-D.

## 2024-07-28 NOTE — Telephone Encounter (Addendum)
 Her risk is at least moderately elevated in view of her medical history. Risk of cardiac complications perioperatively is 10% using RCRI risk calculator. However no contraindication to proceed with surgery. No other cardiac testing indicated.  Device clinic should be made aware of surgery. She has barostim and ICD

## 2024-07-28 NOTE — Telephone Encounter (Signed)
 Pt has appt 08/02/24 with Jodie Lei, PAC. I will forward to preop APP if any further notes needed from preop team.

## 2024-07-28 NOTE — Telephone Encounter (Signed)
 Will route to HF providers to evaluate cardiac clearance or if patient will need appt to evaluate  Last OV 06/07/24 Next appt 10/3 with Rolan

## 2024-08-02 ENCOUNTER — Encounter: Payer: Self-pay | Admitting: Student

## 2024-08-02 ENCOUNTER — Encounter: Payer: Self-pay | Admitting: Internal Medicine

## 2024-08-02 ENCOUNTER — Ambulatory Visit: Attending: Student | Admitting: Student

## 2024-08-02 ENCOUNTER — Telehealth: Payer: Self-pay

## 2024-08-02 VITALS — BP 110/76 | HR 82

## 2024-08-02 DIAGNOSIS — I5022 Chronic systolic (congestive) heart failure: Secondary | ICD-10-CM | POA: Diagnosis present

## 2024-08-02 NOTE — Progress Notes (Signed)
 PERIOPERATIVE PRESCRIPTION FOR IMPLANTED CARDIAC DEVICE PROGRAMMING  Patient Information: Name:  Karen Warner  DOB:  Apr 15, 1951  MRN:  [id number]  Procedure:  C7-T1 IL EST   Date of Surgery:  Clearance TBD                                  Surgeon:  Not provided Surgeon's Group or Practice Name:  [facility] Phone number:  [phone] Fax number:  [phone]   Type of Clearance Requested:   -DEVICE only   Type of Anesthesia:  General   Device Information:  Clinic EP Physician:  Danelle Birmingham, MD   Device Type:  Pacemaker and Defibrillator Manufacturer and Phone #:  [employer]: [phone] Pacemaker Dependent?:  No. Date of Last Device Check:  07/01/2024 Normal Device Function?:  Yes.    Electrophysiologist's Recommendations:  Have magnet available. Provide continuous ECG monitoring when magnet is used or reprogramming is to be performed.  Procedure should not interfere with device function.  No device programming or magnet placement needed.  Per Device Clinic Standing Orders, Delon DELENA Sharps, RN  8:22 AM 08/02/2024

## 2024-08-02 NOTE — Telephone Encounter (Signed)
 Patient was requesting a SECOND med release for upcoming surgery.  She previously had gotten one for testing.  This request is for the surgery.  I gave her the info for her Surgeon to please send over release paperwork for our Pre Cert Dept.  She felt that she was getting a run around and not getting help.  Hopefully, this will clear up and confusion on her need for a SECOND release.  08-02-24 VB

## 2024-08-02 NOTE — Patient Instructions (Signed)
 Medication Instructions:  Your physician recommends that you continue on your current medications as directed. Please refer to the Current Medication list given to you today.  *If you need a refill on your cardiac medications before your next appointment, please call your pharmacy*  Lab Work: None ordered  If you have labs (blood work) drawn today and your tests are completely normal, you will receive your results only by: MyChart Message (if you have MyChart) OR A paper copy in the mail If you have any lab test that is abnormal or we need to change your treatment, we will call you to review the results.  Testing/Procedures: None ordered  Follow-Up: At [facility], you and your health needs are our priority.  As part of our continuing mission to provide you with exceptional heart care, our providers are all part of one team.  This team includes your primary Cardiologist (physician) and Advanced Practice Providers or APPs (Physician Assistants and Nurse Practitioners) who all work together to provide you with the care you need, when you need it.  Your next appointment:   As scheduled   Provider:   Ozell Jodie Passey, PA-C    We recommend signing up for the patient portal called MyChart.  Sign up information is provided on this After Visit Summary.  MyChart is used to connect with patients for Virtual Visits (Telemedicine).  Patients are able to view lab/test results, encounter notes, upcoming appointments, etc.  Non-urgent messages can be sent to your provider as well.   To learn more about what you can do with MyChart, go to [email or web address].   Other Instructions

## 2024-08-02 NOTE — Progress Notes (Signed)
  Cardiology Office Note:   Date:  08/02/2024  ID:  Karen Warner, DOB 24-Sep-1951, MRN [id number]  Primary Cardiologist: Dub Huntsman, DO Electrophysiologist: Danelle Birmingham, MD   History of Present Illness:   Karen Warner is a 73 y.o. female with h/o chronic systolic CHF, ICM, CAD, HTN, HLD, COPD, OSA not on CPAP, DM2, and GERD seen today for acute visit due to on-going stim in her neck.     Patient reports vibratory sensation in her R neck and chest, especially with head turning to the right. Energy is OK. Denies undue SOB. No syncope or chest pain.   Review of systems complete and found to be negative unless listed in HPI.    EP Information / Studies Reviewed:    EKG is not ordered today. EKG from 05/04/2024 reviewed which showed NSR at 78 bpm with barostim artifact   Arrhythmia/Device History [employer] BiV ICD implanted 12/31/2020 for LBBB, HFrEF/ICM Barostim implanted 01/13/2024   Barostim Interrogation- Performed personally and reviewed in detail today,  See scanned report  ICD/PPM interrogation - Not performed today. See last [facility] report    Physical Exam:   VS:  BP 110/76   Pulse 82   SpO2 97%    Wt Readings from Last 3 Encounters:  07/04/24 183 lb (83 kg)  06/13/24 195 lb 14.4 oz (88.9 kg)  06/07/24 193 lb 9.6 oz (87.8 kg)     GEN: Well nourished, well developed in no acute distress NECK: No JVD; No carotid bruits CARDIAC: Regular rate and rhythm, no murmurs, rubs, gallops RESPIRATORY:  Clear to auscultation without rales, wheezing or rhonchi  ABDOMEN: Soft, non-tender, non-distended EXTREMITIES:  No edema; No deformity   ASSESSMENT AND PLAN:    Chronic systolic CHF s/p Chief Financial Officer and Barostim implantation NYHA III symptoms.   Device titrated from 5.6 ma @ 125 us  to 7.6 ma @ 65us millamp with resolution of the sensation in her neck when turning her head.  Device impedence stable. Pt goals are to continue to  improve her functional status. Currently limited by pinched nerve in neck.  Normal device function See scanned report.   Disposition:   Follow up with EP APP in 4-6 weeks if continues to have symptoms - Otherwise, can see back at 6 month mark.   Signed, Ozell Prentice Passey, PA-C

## 2024-08-03 NOTE — Telephone Encounter (Signed)
 Patient has OV 10/2 with Dr. Rolan for preop. Will remove from preop pool.

## 2024-08-15 ENCOUNTER — Ambulatory Visit: Attending: Surgery | Admitting: Surgery

## 2024-08-15 ENCOUNTER — Encounter: Payer: Self-pay | Admitting: Surgery

## 2024-08-15 VITALS — BP 111/75 | HR 81 | Temp 97.8°F | Ht 65.0 in | Wt 178.0 lb

## 2024-08-15 DIAGNOSIS — I739 Peripheral vascular disease, unspecified: Secondary | ICD-10-CM | POA: Diagnosis not present

## 2024-08-15 NOTE — Progress Notes (Signed)
 [facility]  Patient name: Karen Warner MRN: [id number] DOB: March 23, 1951 Sex: female   REASON FOR VISIT:    Abnormal ABIs  HISOTRY OF PRESENT ILLNESS:    Karen Warner is a 73 y.o. female who is status post Barostim implant on January 13, 2024 for ischemic cardiomyopathy with an ejection fraction less than 20%.  She states her energy is improved.  She does have some discomfort around the generator  She is here today because she was evaluated for right toe pain by podiatry and she ended up getting ABIs which were abnormal on the right, and show she was sent for vascular evaluation.  She does not endorse any claudication symptoms.  The patient has a ICD in place [employer]) from 2022. The patient suffers from COPD secondary to a history of tobacco abuse. She has a history of PCI for unstable angina. She takes a statin for hypercholesterolemia.  PAST MEDICAL HISTORY:   Past Medical History:  Diagnosis Date   AICD (automatic cardioverter/defibrillator) present    Anemia 12/12/2019   Anginal pain (HCC)    Arthralgia 01/14/2020   Arthritis    Asthma    Atherosclerotic heart disease of native coronary artery without angina pectoris 04/13/2014   CHF (congestive heart failure) (HCC)    Chronic bilateral low back pain with bilateral sciatica 01/13/2019   Chronic bladder pain 11/08/2014   Last Assessment & Plan:  Formatting of this note might be different from the original. Patient has chronic pain syndrome in general I think this is unfortunately worsening her pelvic pain and bladder pain her examination today was very reassuring I am advised her to use the estrogen cream I did start her on Elavil 10 milligrams at that time if she does tolerated will increase it to 25 milligrams.    Chronic headaches    Chronic idiopathic constipation 12/22/2014   Formatting of this note might be different  from the original. Last Assessment & Plan:  For better bowel emptying please use either Citrucel / Benefiber start with 2 tablespoons daily and titrate up or down to effect.  Also please use glycerin  suppositories as needed to assist in evacuation. Last Assessment & Plan:  Formatting of this note might be different from the original. For better bowel empt   Chronic obstructive pulmonary disease, unspecified (HCC) 03/18/2018   CKD (chronic kidney disease) stage 3, GFR 30-59 ml/min (HCC)    Class 1 obesity due to excess calories with serious comorbidity and body mass index (BMI) of 31.0 to 31.9 in adult 06/26/2020   Colon polyps    COPD (chronic obstructive pulmonary disease) (HCC)    Coronary artery disease    DDD (degenerative disc disease), cervical 01/13/2019   Depression 11/05/2019   Diabetic polyneuropathy associated with type 2 diabetes mellitus (HCC) 02/24/2019   Diverticulitis 05/20/2018   Diverticulitis of colon 03/18/2018   Family history of ischemic heart disease (IHD) 08/16/2013   Fibromyalgia    Fibromyalgia affecting multiple sites 01/14/2020   Gastro-esophageal reflux disease without esophagitis 01/13/2019   Glaucoma 11/08/2014   Hordeolum externum of left upper eyelid 03/13/2020   Hypertension 11/08/2014   IBS (irritable bowel syndrome)    Iron deficiency anemia 01/13/2019   Left renal mass 11/08/2019   Leukocytosis 05/28/2018   Low vitamin B12 level 03/13/2020   Low vitamin D level 12/12/2019   LV dysfunction 05/31/2019   Malignant essential hypertension 01/22/2016   Migraine, unspecified, not intractable, without  status migrainosus 11/08/2014   Mixed hyperlipidemia 01/13/2019   Myocardial infarction [facility])    Other premature beats 11/08/2014   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 Last Assessment & Plan:  For vaginal atrophy please place a pea size dab of Estrogen vaginal cream  into the vagina 3 times a week ( Monday, Wednesday, Friday) Last Assessment & Plan:  Formatting of this note might be different from the original. For vaginal atrophy please place a pea size dab of Estrogen vaginal cream  into the vagina      FAMILY HISTORY:   Family History  Problem Relation Age of Onset   Lung cancer Mother    Coronary artery disease Mother    Diabetes Mother    Glaucoma Mother    Hypertension Mother    Colon polyps Mother    Migraines Father    Heart disease Father    Diabetes Maternal Grandmother    Diabetes Daughter    Migraines Son    Hypertension Brother     SOCIAL HISTORY:   Social History   Tobacco Use   Smoking status: Former    Current packs/day: 0.00    Types: Cigarettes    Quit date: 2021    Years since quitting: 4.7   Smokeless tobacco: Never  Substance Use Topics   Alcohol use: Never     ALLERGIES:   Allergies  Allergen Reactions   Bee Venom Anaphylaxis   Sumatriptan Shortness Of Breath    Migraine worsened   Amoxicillin-Pot Clavulanate Diarrhea and Nausea And Vomiting    GI Intolerance   Oxycodone Itching and Hives    Other reaction(s): Other (see comments) Funny feeling in head  off the edge    Buprenorphine Hcl     Other reaction(s): Itching   Clarithromycin Diarrhea    Abdominal pain   Duloxetine  Hcl Other (See Comments)    drowsiness   Hydrocodone Itching   Lactose     Upset stomach, gas/bloating    Liraglutide Other (See Comments)    Abdominal discomfort   Tramadol Hcl Itching     CURRENT MEDICATIONS:   Current Outpatient Medications  Medication Sig Dispense Refill   acetaminophen  (TYLENOL ) 650 MG CR  tablet Take 650 mg by mouth every 8 (eight) hours as needed for pain.     albuterol  (PROVENTIL ) (2.5 MG/3ML) 0.083% nebulizer solution Take 2.5 mg by nebulization every 4 (four) hours as needed for wheezing or shortness of breath.     aspirin  81 MG EC tablet Take 81 mg by mouth daily.     BD PEN NEEDLE NANO 2ND GEN 32G X 4 MM MISC 3 (three) times daily.     carvedilol  (COREG ) 3.125 MG tablet Take 1 tablet (3.125 mg total) by mouth 2 (two) times daily with a meal. 180 tablet 2   clopidogrel  (PLAVIX ) 75 MG tablet Take 1 tablet (75 mg total) by mouth daily. TAKE 1 TABLET(75 MG) BY MOUTH DAILY 90 tablet 2   Continuous Glucose Sensor (DEXCOM G7 SENSOR) MISC SMARTSIG:1 Topical Every 10 Days     cyclobenzaprine (FLEXERIL) 5 MG tablet Take 5  mg by mouth 3 (three) times daily as needed.     dapagliflozin  propanediol (FARXIGA ) 10 MG TABS tablet TAKE 1 TABLET(10 MG) BY MOUTH DAILY BEFORE BREAKFAST 90 tablet 3   dicyclomine  (BENTYL ) 10 MG capsule TAKE 1 CAPSULE(10 MG) BY MOUTH IN THE MORNING AND AT BEDTIME 180 capsule 0   digoxin  (LANOXIN ) 0.125 MG tablet TAKE ONE-HALF TABLET BY MOUTH EVERY DAY 45 tablet 1   ezetimibe  (ZETIA ) 10 MG tablet Take 1 tablet (10 mg total) by mouth daily. 30 tablet 6   famotidine  (PEPCID ) 40 MG tablet TAKE 1 TABLET(40 MG) BY MOUTH TWICE DAILY 60 tablet 2   folic acid  (FOLVITE ) 1 MG tablet Take 1 mg by mouth daily.     insulin  lispro (HUMALOG) 100 UNIT/ML KwikPen Inject 2-20 Units into the skin 3 (three) times daily with meals.  per sliding scale     isosorbide  mononitrate (IMDUR ) 30 MG 24 hr tablet Take 15 mg by mouth daily.     lidocaine  (LIDODERM ) 5 % Place 1 patch onto the skin daily. Remove & Discard patch within 12 hours or as directed by MD 5 patch 0   loratadine (CLARITIN) 10 MG tablet Take 10 mg by mouth daily.     losartan  (COZAAR ) 25 MG tablet Take 0.5 tablets (12.5 mg total) by mouth daily. 45 tablet 3   nitroGLYCERIN  (NITROSTAT ) 0.4 MG SL tablet DISSOLVE 1 TABLET UNDER  TONGUE EVERY 5 MINUTES AS NEEDED FOR CHEST PAIN. 25 tablet 3   OZEMPIC, 0.25 OR 0.5 MG/DOSE, 2 MG/3ML SOPN Inject 0.5 mg into the skin every Monday.     pantoprazole  (PROTONIX ) 40 MG tablet Take 1 tablet (40 mg total) by mouth daily. TAKE 1 TABLET(40 MG) BY MOUTH DAILY 90 tablet 2   Plecanatide  (TRULANCE ) 3 MG TABS Take 1 tablet (3 mg total) by mouth daily. (Patient taking differently: Take 3 mg by mouth as needed.) 30 tablet 2   ranolazine  (RANEXA ) 500 MG 12 hr tablet Take 1 tablet (500 mg total) by mouth 2 (two) times daily. 180 tablet 3   rosuvastatin  (CRESTOR ) 40 MG tablet Take 40 mg by mouth at bedtime.     spironolactone  (ALDACTONE ) 25 MG tablet Take 0.5 tablets (12.5 mg total) by mouth daily. 45 tablet 3   torsemide  (DEMADEX ) 20 MG tablet Take 1 tablet (20 mg total) by mouth 2 (two) times daily. 40 mg in the morning and 20 mg in the evening, alternating with 20 mg Twice daily 180 tablet 3   TRESIBA FLEXTOUCH 200 UNIT/ML FlexTouch Pen Inject 35 Units into the skin 2 (two) times daily.     TRINTELLIX  5 MG TABS tablet Take 5 mg by mouth at bedtime.     Ubrogepant 50 MG TABS Take 50 mg by mouth daily as needed (Headache).     valACYclovir (VALTREX) 500 MG tablet Take 500 mg by mouth daily.     vitamin B-12 (CYANOCOBALAMIN ) 1000 MCG tablet Take 1,000 mcg by mouth daily.     Vitamin D, Ergocalciferol, (DRISDOL) 1.25 MG (50000 UNIT) CAPS capsule Take 50,000 Units by mouth every 7 (seven) days.     No current facility-administered medications for this visit.    REVIEW OF SYSTEMS:   [X]  denotes positive finding, [ ]  denotes negative finding Cardiac  Comments:  Chest pain or chest pressure:    Shortness of breath upon exertion:    Short of breath when lying flat:    Irregular heart rhythm:  Vascular    Pain in calf, thigh, or hip brought on by ambulation:    Pain in feet at night that wakes you up from your sleep:     Blood clot in your veins:    Leg swelling:         Pulmonary     Oxygen at home:    Productive cough:     Wheezing:         Neurologic    Sudden weakness in arms or legs:     Sudden numbness in arms or legs:     Sudden onset of difficulty speaking or slurred speech:    Temporary loss of vision in one eye:     Problems with dizziness:         Gastrointestinal    Blood in stool:     Vomited blood:         Genitourinary    Burning when urinating:     Blood in urine:        Psychiatric    Major depression:         Hematologic    Bleeding problems:    Problems with blood clotting too easily:        Skin    Rashes or ulcers:        Constitutional    Fever or chills:      PHYSICAL EXAM:   Vitals:   08/15/24 1054  BP: 111/75  Pulse: 81  Temp: 97.8 F (36.6 C)  SpO2: 96%  Weight: 178 lb (80.7 kg)  Height: 5' 5 (1.651 m)    GENERAL: The patient is a well-nourished female, in no acute distress. The vital signs are documented above. CARDIAC: There is a regular rate and rhythm.  VASCULAR: Nonpalpable pedal pulses PULMONARY: Non-labored respirations MUSCULOSKELETAL: There are no major deformities or cyanosis. NEUROLOGIC: No focal weakness or paresthesias are detected. SKIN: There are no ulcers or rashes noted. PSYCHIATRIC: The patient has a normal affect.  STUDIES:   I have reviewed the following: ABI/TBIToday's ABIToday's TBIPrevious ABIPrevious TBI  +-------+-----------+-----------+------------+------------+  Right 0.65       0.51                                 +-------+-----------+-----------+------------+------------+  Left  0.94       0.58                                 +-------+-----------+-----------+------------+------------+  Right toe pressure: 74 Left toe pressure: 80 MEDICAL ISSUES:   PAD: The patient has moderate disease on the right.  She does not endorse symptoms.  She does not have open wounds.  I have recommended conservative management at this time.  She was instructed to examine her  feet on a daily basis and to let me know if she develops a wound.  Otherwise she will follow-up in 1 year with repeat ABIs:  Barostem: She is having discomfort over the pocket.  She has been getting Flexeril by her PCP which does help a little bit.  She is also using Tylenol  and Voltaren  gel.    Malvina Serene CLORE, MD, FACS [facility] [phone] Pager [phone]

## 2024-08-24 NOTE — Progress Notes (Signed)
Remote ICD Transmission.

## 2024-08-25 ENCOUNTER — Other Ambulatory Visit (HOSPITAL_COMMUNITY): Payer: Self-pay

## 2024-08-25 DIAGNOSIS — I5022 Chronic systolic (congestive) heart failure: Secondary | ICD-10-CM

## 2024-08-25 DIAGNOSIS — I2510 Atherosclerotic heart disease of native coronary artery without angina pectoris: Secondary | ICD-10-CM

## 2024-08-25 MED ORDER — TORSEMIDE 20 MG PO TABS: 20.0000 mg | ORAL_TABLET | Freq: Two times a day (BID) | ORAL | 3 refills | Status: DC

## 2024-08-28 DIAGNOSIS — R079 Chest pain, unspecified: Secondary | ICD-10-CM | POA: Diagnosis not present

## 2024-08-28 DIAGNOSIS — I5020 Unspecified systolic (congestive) heart failure: Secondary | ICD-10-CM | POA: Diagnosis not present

## 2024-08-28 DIAGNOSIS — G4733 Obstructive sleep apnea (adult) (pediatric): Secondary | ICD-10-CM | POA: Diagnosis not present

## 2024-08-28 DIAGNOSIS — E119 Type 2 diabetes mellitus without complications: Secondary | ICD-10-CM | POA: Diagnosis not present

## 2024-08-29 DIAGNOSIS — I447 Left bundle-branch block, unspecified: Secondary | ICD-10-CM | POA: Diagnosis not present

## 2024-08-29 DIAGNOSIS — R079 Chest pain, unspecified: Secondary | ICD-10-CM | POA: Diagnosis not present

## 2024-08-29 DIAGNOSIS — I2510 Atherosclerotic heart disease of native coronary artery without angina pectoris: Secondary | ICD-10-CM | POA: Diagnosis not present

## 2024-08-29 DIAGNOSIS — I5020 Unspecified systolic (congestive) heart failure: Secondary | ICD-10-CM | POA: Diagnosis not present

## 2024-08-31 ENCOUNTER — Telehealth (HOSPITAL_COMMUNITY): Payer: Self-pay | Admitting: Cardiology

## 2024-08-31 NOTE — Telephone Encounter (Signed)
 Called to confirm/remind patient of their appointment at [facility] on 08/31/24.   Appointment:   [] Confirmed  [] Left mess   [] No answer/No voice mail  [x] VM Full/unable to leave message  [] Phone not in service  Patient reminded to bring all medications and/or complete list.  Confirmed patient has transportation. Gave directions, instructed to utilize valet parking.

## 2024-09-01 ENCOUNTER — Other Ambulatory Visit: Payer: Self-pay | Admitting: Cardiology

## 2024-09-01 ENCOUNTER — Other Ambulatory Visit (HOSPITAL_COMMUNITY): Payer: Self-pay | Admitting: *Deleted

## 2024-09-01 ENCOUNTER — Ambulatory Visit (HOSPITAL_COMMUNITY)
Admission: RE | Admit: 2024-09-01 | Discharge: 2024-09-01 | Disposition: A | Discharge status: Home / Self Care | Source: Ambulatory Visit | Attending: Cardiology | Admitting: Cardiology

## 2024-09-01 DIAGNOSIS — I255 Ischemic cardiomyopathy: Secondary | ICD-10-CM | POA: Insufficient documentation

## 2024-09-01 DIAGNOSIS — Z95810 Presence of automatic (implantable) cardiac defibrillator: Secondary | ICD-10-CM | POA: Diagnosis not present

## 2024-09-01 DIAGNOSIS — N183 Chronic kidney disease, stage 3 unspecified: Secondary | ICD-10-CM | POA: Insufficient documentation

## 2024-09-01 DIAGNOSIS — I5022 Chronic systolic (congestive) heart failure: Secondary | ICD-10-CM | POA: Diagnosis present

## 2024-09-01 DIAGNOSIS — Z79899 Other long term (current) drug therapy: Secondary | ICD-10-CM | POA: Diagnosis not present

## 2024-09-01 DIAGNOSIS — J449 Chronic obstructive pulmonary disease, unspecified: Secondary | ICD-10-CM | POA: Insufficient documentation

## 2024-09-01 DIAGNOSIS — Z7985 Long-term (current) use of injectable non-insulin antidiabetic drugs: Secondary | ICD-10-CM | POA: Diagnosis not present

## 2024-09-01 DIAGNOSIS — E669 Obesity, unspecified: Secondary | ICD-10-CM | POA: Diagnosis not present

## 2024-09-01 DIAGNOSIS — M542 Cervicalgia: Secondary | ICD-10-CM | POA: Diagnosis not present

## 2024-09-01 DIAGNOSIS — Z87891 Personal history of nicotine dependence: Secondary | ICD-10-CM | POA: Insufficient documentation

## 2024-09-01 DIAGNOSIS — I739 Peripheral vascular disease, unspecified: Secondary | ICD-10-CM | POA: Insufficient documentation

## 2024-09-01 DIAGNOSIS — Z6830 Body mass index (BMI) 30.0-30.9, adult: Secondary | ICD-10-CM | POA: Diagnosis not present

## 2024-09-01 DIAGNOSIS — M25511 Pain in right shoulder: Secondary | ICD-10-CM | POA: Insufficient documentation

## 2024-09-01 DIAGNOSIS — Z955 Presence of coronary angioplasty implant and graft: Secondary | ICD-10-CM | POA: Insufficient documentation

## 2024-09-01 DIAGNOSIS — I2510 Atherosclerotic heart disease of native coronary artery without angina pectoris: Secondary | ICD-10-CM | POA: Diagnosis not present

## 2024-09-01 LAB — BASIC METABOLIC PANEL WITH GFR
Anion gap: 10 (ref 5–15)
BUN: 11 mg/dL (ref 8–23)
CO2: 26 mmol/L (ref 22–32)
Calcium: 9.3 mg/dL (ref 8.9–10.3)
Chloride: 106 mmol/L (ref 98–111)
Creatinine, Ser: 1.25 mg/dL — ABNORMAL HIGH (ref 0.44–1.00)
GFR, Estimated: 45 mL/min — ABNORMAL LOW (ref >60)
Glucose, Bld: 148 mg/dL — ABNORMAL HIGH (ref 70–99)
Potassium: 3.6 mmol/L (ref 3.5–5.1)
Sodium: 142 mmol/L (ref 135–145)

## 2024-09-01 LAB — LIPID PANEL
Cholesterol: 107 mg/dL (ref 0–200)
HDL: 41 mg/dL (ref >40)
LDL Cholesterol: 44 mg/dL (ref 0–99)
Total CHOL/HDL Ratio: 2.6 ratio
Triglycerides: 110 mg/dL (ref <150)
VLDL: 22 mg/dL (ref 0–40)

## 2024-09-01 LAB — DIGOXIN LEVEL: Digoxin Level: <0.6 ng/mL — ABNORMAL LOW (ref 0.8–2.0)

## 2024-09-01 LAB — BRAIN NATRIURETIC PEPTIDE: B Natriuretic Peptide: 169.4 pg/mL — ABNORMAL HIGH (ref 0.0–100.0)

## 2024-09-01 MED ORDER — TORSEMIDE 20 MG PO TABS: 20.0000 mg | ORAL_TABLET | Freq: Two times a day (BID) | ORAL | 3 refills | Status: DC

## 2024-09-01 MED ORDER — CARVEDILOL 3.125 MG PO TABS: 3.1250 mg | ORAL_TABLET | Freq: Two times a day (BID) | ORAL | 3 refills | Status: AC

## 2024-09-01 MED ORDER — SPIRONOLACTONE 25 MG PO TABS: 25.0000 mg | ORAL_TABLET | Freq: Every day | ORAL | 3 refills | Status: AC

## 2024-09-01 NOTE — Patient Instructions (Signed)
 Medication Changes:  INCREASE SPIRONOLACTONE  TO 25MG  ONCE DAILY   INCREASE TORSEMIDE  TO 40MG  IN THE MORNING, AND 20MG  IN THE EVENING, AND ALTERNATE WITH 20MG  TWICE DAILY THE NEXT DAY   Lab Work:  Labs done today, your results will be available in MyChart, we will contact you for abnormal readings.  THEN LABS AGAIN IN 10 DAYS AS SCHEDULED   Follow-Up in: 6 WEEKS WITH APP AS SCHEDULED   At the [facility], you and your health needs are our priority. We have a designated team specialized in the treatment of Heart Failure. This Care Team includes your primary Heart Failure Specialized Cardiologist (physician), Advanced Practice Providers (APPs- Physician Assistants and Nurse Practitioners), and Pharmacist who all work together to provide you with the care you need, when you need it.   You may see any of the following providers on your designated Care Team at your next follow up:  Dr. Toribio Fuel Dr. Ezra Shuck Dr. Ria Commander Dr. Odis Brownie Greig Mosses, NP [place]affie Shed, [place] [facility] [place], [place] Beckey Coe, NP [place] Lee, NP Tinnie Redman, PharmD   Please be sure to bring in all your medications bottles to every appointment.   Need to Contact Us :  If you have any questions or concerns before your next appointment please send us  a message through [place] or call our office at [phone].    TO LEAVE A MESSAGE FOR THE NURSE SELECT OPTION 2, PLEASE LEAVE A MESSAGE INCLUDING: YOUR NAME DATE OF BIRTH CALL BACK NUMBER REASON FOR CALL**this is important as we prioritize the call backs  YOU WILL RECEIVE A CALL BACK THE SAME DAY AS LONG AS YOU CALL BEFORE 4:00 PM

## 2024-09-02 NOTE — Progress Notes (Addendum)
 PCP: Silver Lamar LABOR, MD Cardiology: Dr. Sheena HF Cardiology: Dr. Rolan   Chief complaint: CHF  73 y.o. with history of CAD and ischemic cardiomyopathy and COPD. Patient had PCI to mid/distal LAD in 2020, then had unstable angina in 9/21 with DES to 70% stenosis proximal LAD.  She has had a cardiomyopathy for a number of years, [employer] CRT-D device placed in 2022.  Last echo in 8/24 showed EF < 20%, moderate LV dilation, mild LVH, RV normal.  She additionally has a diagnosis of COPD, quit smoking in 2021. She lives in [place], originally from [place]  but moved here to be near family.   LHC/RHC in 11/24 showed patent LAD stents with distal edge dissection in the apical LAD (no change from prior films), normal filling pressures, CI low at 2.12.   S/p barostimulation activation therapy device implant 2/25.  Today she returns for HF follow up. Main complaint is right shoulder/arm/chest and neck pain.  She had an epidural infection in her c-spine area for nerve pain.  She was admitted to the hospital in [place] about a week ago due to the pain.  Per her report, cardiac workup was negative (troponin not elevated).  I do not have access to these records.  She is not very active due to pain. Weight is down 11 lbs. She is short of breath with moderate activity such as walking up stairs.  She chronically sleeps with the head of her bed elevated. No claudication.   [employer] device interrogation (personally reviewed): HeartLogic Score 20, 99% BiV pacing   Labs (7/24): K 4, creatinine 1.45 Labs (10/24): K 4.1, creatinine 1.6, LDL 74 Labs (12/24): LDL 41 Labs (2/25): K 3.5, creatinine 1.30 Labs (5/25): K 3.9, creatinine 1.24, LDL 48 Labs (6/25): K 3.2, creatinine 1.42 Labs (7/25): K 4.1, creatinine 1.63  ECG (personally reviewed):  none ordered today.  PMH: 1. Fe deficiency anemia 2. OSA: Does not tolerate CPAP.  3. HTN 4. IBS 5. COPD: Quit smoking in 2021.  6. Depression   7. Type 2 diabetes 8. CAD: PCI mid-distal LAD in 2020.  - LHC (9/21): Unstable angina, 70% proximal LAD treated with DES.  - LHC (11/24): Patent LAD stents with distal edge dissection in the apical LAD (no change from prior films) 9. Chronic systolic CHF: Suspect ischemic cardiomyopathy.  [employer] CRT-D device.  - Echo (8/22): EF 25-30% - Echo (3/23): EF 30% - Echo (8/24): EF < 20%, moderate LV dilation, mild LVH, RV normal - RHC (11/24): mean RA 3, PA 35/9, mean PCWP 9, CI 2.12, PAPi 8.7 - s/p  barostim 2/25 10. CKD stage 3 11. PAD: ABIs moderately decreased on right, mildly decreased on left.   FH: No sudden death or cardiomyopathy  Social History   Socioeconomic History   Marital status: Significant Other    Spouse name: Not on file   Number of children: 3   Years of education: Not on file   Highest education level: Not on file  Occupational History   Occupation: retired  Tobacco Use   Smoking status: Former    Current packs/day: 0.00    Types: Cigarettes    Quit date: 2021    Years since quitting: 4.7   Smokeless tobacco: Never  Vaping Use   Vaping status: Never Used  Substance and Sexual Activity   Alcohol use: Never   Drug use: Never   Sexual activity: Not on file  Other Topics Concern   Not on file  Social History Narrative   Not on file   Social Drivers of Health   Financial Resource Strain: Low Risk  (05/18/2024)   Received from [facility]   Overall Financial Resource Strain (CARDIA)    Difficulty of Paying Living Expenses: Not hard at all  Food Insecurity: Low Risk  (06/24/2024)   Received from [facility]   Hunger Vital Sign    Within the past 12 months, you worried that your food would run out before you got money to buy more: Never true    Within the past 12 months, the food you bought just didn't last and you didn't have money to get more. : Never true  Transportation Needs: No Transportation Needs (06/24/2024)   Received from [employer]    In the past 12 months, has lack of reliable transportation kept you from medical appointments, meetings, work or from getting things needed for daily living? : No  Physical Activity: Not on file  Stress: Not on file  Social Connections: Not on file  Intimate Partner Violence: Not on file   ROS: All systems reviewed and negative except as per HPI.   Current Outpatient Medications  Medication Sig Dispense Refill   acetaminophen  (TYLENOL ) 650 MG CR tablet Take 650 mg by mouth every 8 (eight) hours as needed for pain.     albuterol  (PROVENTIL ) (2.5 MG/3ML) 0.083% nebulizer solution Take 2.5 mg by nebulization every 4 (four) hours as needed for wheezing or shortness of breath.     aspirin  81 MG EC tablet Take 81 mg by mouth daily.     BD PEN NEEDLE NANO 2ND GEN 32G X 4 MM MISC 3 (three) times daily.     clopidogrel  (PLAVIX ) 75 MG tablet Take 1 tablet (75 mg total) by mouth daily. TAKE 1 TABLET(75 MG) BY MOUTH DAILY 90 tablet 2   Continuous Glucose Sensor (DEXCOM G7 SENSOR) MISC SMARTSIG:1 Topical Every 10 Days     cyclobenzaprine (FLEXERIL) 5 MG tablet Take 5 mg by mouth 3 (three) times daily as needed.     dicyclomine  (BENTYL ) 10 MG capsule TAKE 1 CAPSULE(10 MG) BY MOUTH IN THE MORNING AND AT BEDTIME 180 capsule 0   digoxin  (LANOXIN ) 0.125 MG tablet TAKE ONE-HALF TABLET BY MOUTH EVERY DAY 45 tablet 1   ezetimibe  (ZETIA ) 10 MG tablet Take 1 tablet (10 mg total) by mouth daily. 30 tablet 6   famotidine  (PEPCID ) 40 MG tablet TAKE 1 TABLET(40 MG) BY MOUTH TWICE DAILY 60 tablet 2   folic acid  (FOLVITE ) 1 MG tablet Take 1 mg by mouth daily.     insulin  lispro (HUMALOG) 100 UNIT/ML KwikPen Inject 2-20 Units into the skin 3 (three) times daily with meals.  per sliding scale     isosorbide  mononitrate (IMDUR ) 30 MG 24 hr tablet Take 15 mg by mouth daily.     lidocaine  (LIDODERM ) 5 % Place 1 patch onto the skin daily. Remove & Discard patch within 12 hours or as directed by MD  5 patch 0   loratadine (CLARITIN) 10 MG tablet Take 10 mg by mouth daily.     losartan  (COZAAR ) 25 MG tablet Take 0.5 tablets (12.5 mg total) by mouth daily. 45 tablet 3   nitroGLYCERIN  (NITROSTAT ) 0.4 MG SL tablet DISSOLVE 1 TABLET UNDER TONGUE EVERY 5 MINUTES AS NEEDED FOR CHEST PAIN. 25 tablet 3   OZEMPIC, 0.25 OR 0.5 MG/DOSE, 2 MG/3ML SOPN Inject 0.5 mg into the skin every Monday.  pantoprazole  (PROTONIX ) 40 MG tablet Take 1 tablet (40 mg total) by mouth daily. TAKE 1 TABLET(40 MG) BY MOUTH DAILY 90 tablet 2   Plecanatide  (TRULANCE ) 3 MG TABS Take 1 tablet by mouth as needed.     ranolazine  (RANEXA ) 500 MG 12 hr tablet Take 1 tablet (500 mg total) by mouth 2 (two) times daily. 180 tablet 3   rosuvastatin  (CRESTOR ) 40 MG tablet Take 40 mg by mouth at bedtime.     TRESIBA FLEXTOUCH 200 UNIT/ML FlexTouch Pen Inject 35 Units into the skin 2 (two) times daily.     TRINTELLIX  5 MG TABS tablet Take 5 mg by mouth at bedtime.     Ubrogepant 50 MG TABS Take 50 mg by mouth daily as needed (Headache).     valACYclovir (VALTREX) 500 MG tablet Take 500 mg by mouth daily.     vitamin B-12 (CYANOCOBALAMIN ) 1000 MCG tablet Take 1,000 mcg by mouth daily.     Vitamin D, Ergocalciferol, (DRISDOL) 1.25 MG (50000 UNIT) CAPS capsule Take 50,000 Units by mouth every 7 (seven) days.     carvedilol  (COREG ) 3.125 MG tablet Take 1 tablet (3.125 mg total) by mouth 2 (two) times daily with a meal. 180 tablet 3   dapagliflozin  propanediol (FARXIGA ) 10 MG TABS tablet TAKE 1 TABLET(10 MG) BY MOUTH DAILY BEFORE BREAKFAST (Patient not taking: Reported on 09/01/2024) 90 tablet 3   spironolactone  (ALDACTONE ) 25 MG tablet Take 1 tablet (25 mg total) by mouth daily. 30 tablet 3   torsemide  (DEMADEX ) 20 MG tablet Take 1 tablet (20 mg total) by mouth 2 (two) times daily. 40 mg in the morning and 20 mg in the evening, alternating with 20 mg Twice daily 180 tablet 3   No current facility-administered medications for this encounter.    Wt Readings from Last 3 Encounters:  09/01/24 82.6 kg (182 lb 3.2 oz)  08/15/24 80.7 kg (178 lb)  07/04/24 83 kg (183 lb)   BP 110/70   Pulse 82   Wt 82.6 kg (182 lb 3.2 oz)   SpO2 99%   BMI 30.32 kg/m  General: NAD Neck: JVP 8 cm, no thyromegaly or thyroid nodule.  Lungs: Clear to auscultation bilaterally with normal respiratory effort. CV: Nondisplaced PMI.  Heart regular S1/S2, no S3/S4, no murmur.  Trace ankle edema.  No carotid bruit.  Unable to palpate pedal pulses.  Abdomen: Soft, nontender, no hepatosplenomegaly, no distention.  Skin: Intact without lesions or rashes.  Neurologic: Alert and oriented x 3.  Psych: Normal affect. Extremities: No clubbing or cyanosis.  HEENT: Normal.   Assessment/Plan: 1. Chronic systolic CHF: Ischemic cardiomyopathy.  [employer] CRT-D device.  Echo in 8/24 showed EF < 20%, moderate LV dilation, mild LVH, RV normal.  This is somewhat worse than priors. LHC/RHC in 11/24 showed low CI at 2.12, normal filling pressures, unchanged coronary anatomy with no severe stenoses. S/p barostimulation activation therapy device 2/25. NYHA class II-III.  Though weight is down, she is mildly volume overloaded on exam with HeartLogic score 20. Low BP has limited GDMT titration.  - Increase spironolactone  to 25 mg daily.  BMET/BNP today, BMET in 10 days.  - Increase torsemide  back to 40 qam/20 qpm alternating with 20 mg bid.  - Continue losartan  12.5 mg daily.  - Continue Coreg  3.125 mg bid.  - She stopped Farxiga  due to multiple yeast infections.  - Continue digoxin  0.0625 mg daily. Check level.  - Arrange CPX after next appointment.  - Need to  get records of echo from [facility] in 9/25 or repeat echo.  2.  CAD: PCI to mid/distal LAD in 2020 and proximal LAD in 2021.  LHC in 11/24 showed stable coronary anatomy with no severe stenoses. No chest pain. - Continue ranolazine  500 mg bid. - Continue ASA/Plavix .  - Continue Crestor  + Zetia , goal LDL  < 55. Check lipids.  3. COPD: History of smoking, quit 2021.   4. CKD 3: Follows with nephology at [facility]. Avoid hypotension.  5. Obesity: Body mass index is 30.32 kg/m. - Continue semaglutide. 6. PAD: Moderately decreased ABI on right in 7/25.  No claudication.  - She saw Dr. Serene, plan medical management.   Follow up in 6 wks with APP.   I spent 32 minutes reviewing records, interviewing/examining patient, and managing orders.   Ezra Shuck  09/02/2024

## 2024-09-02 NOTE — Addendum Note (Signed)
 Encounter addended by: Rolan Ezra RAMAN, MD on: 09/02/2024 12:15 AM  Actions taken: Clinical Note Signed

## 2024-09-04 ENCOUNTER — Ambulatory Visit (HOSPITAL_COMMUNITY): Payer: Self-pay | Admitting: Cardiology

## 2024-09-05 ENCOUNTER — Ambulatory Visit (INDEPENDENT_AMBULATORY_CARE_PROVIDER_SITE_OTHER): Admitting: Podiatry

## 2024-09-05 ENCOUNTER — Encounter: Payer: Self-pay | Admitting: Podiatry

## 2024-09-05 DIAGNOSIS — B351 Tinea unguium: Secondary | ICD-10-CM

## 2024-09-05 DIAGNOSIS — M79675 Pain in left toe(s): Secondary | ICD-10-CM

## 2024-09-05 DIAGNOSIS — E1142 Type 2 diabetes mellitus with diabetic polyneuropathy: Secondary | ICD-10-CM | POA: Diagnosis not present

## 2024-09-05 DIAGNOSIS — M79674 Pain in right toe(s): Secondary | ICD-10-CM

## 2024-09-05 DIAGNOSIS — I739 Peripheral vascular disease, unspecified: Secondary | ICD-10-CM

## 2024-09-05 NOTE — Progress Notes (Signed)
  Subjective:  Patient ID: Karen Warner, female    DOB: 06-Aug-1951,  MRN: [id number]  Chief Complaint  Patient presents with   [facility]    [facility] no callous A1c 7. In  Plavix  and ASA     73 y.o. female presents with the above complaint. History confirmed with patient. Patient presenting with pain related to dystrophic thickened elongated nails. Patient is unable to trim own nails related to nail dystrophy. Patient does  have a history of T2DM.  Known PAD. Has recently seen her vascular surgeon.  Does report some pain to both first toes.  Objective:  Physical Exam: warm, good capillary refill, decreased pedal hair growth, pedal skin atrophic nail exam onychomycosis of the toenails, onycholysis, dystrophic nails, and greater than 3 mm thickening DP pulses nonpalpable, PT pulses nonpalpable, and protective sensation decreased Left Foot:  Pain with palpation of nails due to elongation and dystrophic growth.  Some tenderness on palpation of left first toe lateral nail border without significant incurvation or signs of acute infection. Right Foot: Pain with palpation of nails due to elongation and dystrophic growth.  Some tenderness on palpation of right first toe distal tuft without significant hyperkeratotic tissue  Assessment:   1. Pain due to onychomycosis of toenails of both feet   2. PAD (peripheral artery disease)   3. Diabetic polyneuropathy associated with type 2 diabetes mellitus (HCC)      Plan:  Patient was evaluated and treated and all questions answered.  #Onychomycosis with pain  -Nails palliatively debrided as below. -Educated on self-care - May try Epsom salt soaks in warm water to be affected painful toenails, also try applying small amount of antibacterial ointment with bandage to soften up the surrounding skin.  Procedure: Nail Debridement Rationale: Pain Type of Debridement: manual, sharp debridement. Instrumentation: Nail nipper, rotary burr. Number of  Nails: 10  # Diabetes with neuropathy # PAD Patient educated on diabetes. Discussed proper diabetic foot care and discussed risks and complications of disease. Educated patient in depth on reasons to return to the office immediately should he/she discover anything concerning or new on the feet. All questions answered. Discussed proper shoes as well.  - Continue to monitor - At risk footcare due to diminished pedal pulses.  Return in about 9 weeks (around 11/07/2024) for Diabetic Foot Care.         Ethan Saddler, DPM [facility]

## 2024-09-05 NOTE — Patient Instructions (Signed)
 For your left first toenail that is causing trouble, you can try soaking the toe in warm Epsom salts for 15 to 20 minutes 1-2 times a day.  Also recommend applying small amount of antibiotic bacterial ointment such as Polysporin or Neosporin and keeping covered with a Band-Aid to help keep that edge of the skin soft.  Lastly you can take a small piece of a cotton ball, roll it and some antibiotic ointment and gently tuck it underneath the edge of that toenail which may help it grow out and decrease the degree of incurvation that is occurring. Can repeat this 1-2 times a day.

## 2024-09-07 ENCOUNTER — Encounter: Payer: Self-pay | Admitting: Internal Medicine

## 2024-09-08 ENCOUNTER — Encounter: Payer: Self-pay | Admitting: Student

## 2024-09-10 LAB — BASIC METABOLIC PANEL WITH GFR
BUN/Creatinine Ratio: 11 — ABNORMAL LOW (ref 12–28)
BUN: 17 mg/dL (ref 8–27)
CO2: 24 mmol/L (ref 20–29)
Calcium: 9.7 mg/dL (ref 8.7–10.3)
Chloride: 101 mmol/L (ref 96–106)
Creatinine, Ser: 1.57 mg/dL — ABNORMAL HIGH (ref 0.57–1.00)
Glucose: 147 mg/dL — ABNORMAL HIGH (ref 70–99)
Potassium: 4.4 mmol/L (ref 3.5–5.2)
Sodium: 139 mmol/L (ref 134–144)
eGFR: 34 mL/min/1.73 — ABNORMAL LOW (ref >59)

## 2024-09-12 ENCOUNTER — Encounter: Payer: Self-pay | Admitting: Internal Medicine

## 2024-09-13 ENCOUNTER — Ambulatory Visit: Payer: Self-pay | Admitting: Cardiology

## 2024-09-13 ENCOUNTER — Telehealth: Payer: Self-pay

## 2024-09-13 ENCOUNTER — Encounter: Payer: Self-pay | Admitting: Student

## 2024-09-13 ENCOUNTER — Ambulatory Visit: Attending: Student | Admitting: Student

## 2024-09-13 VITALS — BP 133/71 | HR 92 | Ht 65.0 in | Wt 174.8 lb

## 2024-09-13 DIAGNOSIS — I2510 Atherosclerotic heart disease of native coronary artery without angina pectoris: Secondary | ICD-10-CM | POA: Diagnosis present

## 2024-09-13 DIAGNOSIS — I5022 Chronic systolic (congestive) heart failure: Secondary | ICD-10-CM | POA: Insufficient documentation

## 2024-09-13 NOTE — Telephone Encounter (Signed)
 Called pt to check in on her. No answer, left message for her to return the call.

## 2024-09-13 NOTE — Progress Notes (Signed)
  Cardiology Office Note:   Date:  09/13/2024  ID:  Karen Warner, DOB 26-Feb-1951, MRN [id number]  Primary Cardiologist: Dub Huntsman, DO Electrophysiologist: Danelle Birmingham, MD   History of Present Illness:   Karen Warner is a 73 y.o. female with h/o h/o chronic systolic CHF, ICM, CAD, HTN, HLD, COPD, OSA not on CPAP, DM2, and GERD seen today for routine electrophysiology followup.   At last visit, device titrated from 5.6 ma @ 125 us  to 7.6 ma @ 65us millamp with resolution of the sensation in her neck when turning her head.   Since last being seen in our clinic the patient reports doing OK. She got a shot in her neck which significantly helped with stiffness and discomfort. It only lasted for a few days however.  Recently underwent a second shot and has been doing well. No intermittent stim. Has brief stim approx 1 hr after last adjustment, but none since.   Review of systems complete and found to be negative unless listed in HPI.    EP Information / Studies Reviewed:    EKG is not ordered today. EKG from 09/01/2024 reviewed which showed NSR at 92 bpm with barostim noise   Arrhythmia/Device History [employer] BiV ICD implanted 12/31/2020 for LBBB, HFrEF/ICM Barostim implanted 01/13/2024   Barostim Interrogation- Performed personally and reviewed in detail today,  See scanned report  ICD/PPM interrogation - Not performed today. See last PaceArt report    Physical Exam:   VS:  BP 133/71   Pulse 92   Ht 5' 5 (1.651 m)   Wt 174 lb 12.8 oz (79.3 kg)   SpO2 99%   BMI 29.09 kg/m    Wt Readings from Last 3 Encounters:  09/13/24 174 lb 12.8 oz (79.3 kg)  09/01/24 182 lb 3.2 oz (82.6 kg)  08/15/24 178 lb (80.7 kg)     GEN: Well nourished, well developed in no acute distress NECK: No JVD; No carotid bruits CARDIAC: Regular rate and rhythm, no murmurs, rubs, gallops RESPIRATORY:  Clear to auscultation without rales, wheezing or rhonchi  ABDOMEN: Soft,  non-tender, non-distended EXTREMITIES:  No edema; No deformity   ASSESSMENT AND PLAN:    Chronic systolic CHF s/p Chief Financial Officer and Barostim implantation NYHA III symptoms.   Device titrated from 7.6 millamp to 8.4 milliamp by increments of 0.4 with good blood pressure response. Pt did have recurrent stim at 9.0 ma. (65 us )  Device impedence stable. Pt goals are to continue to improve functional status and mood.  Normal device function See scanned report.   Disposition:   Follow up with Jodie Passey in in 3 months  Signed, Ozell Prentice Passey, PA-C

## 2024-09-13 NOTE — Patient Instructions (Signed)
 Medication Instructions:  Your physician recommends that you continue on your current medications as directed. Please refer to the Current Medication list given to you today.  *If you need a refill on your cardiac medications before your next appointment, please call your pharmacy*  Lab Work: None ordered If you have labs (blood work) drawn today and your tests are completely normal, you will receive your results only by: MyChart Message (if you have MyChart) OR A paper copy in the mail If you have any lab test that is abnormal or we need to change your treatment, we will call you to review the results.  Follow-Up: At [facility], you and your health needs are our priority.  As part of our continuing mission to provide you with exceptional heart care, our providers are all part of one team.  This team includes your primary Cardiologist (physician) and Advanced Practice Providers or APPs (Physician Assistants and Nurse Practitioners) who all work together to provide you with the care you need, when you need it.  Your next appointment:   3 month(s)  Provider:   Ozell Jodie Passey, PA-C

## 2024-09-14 ENCOUNTER — Telehealth (HOSPITAL_COMMUNITY): Payer: Self-pay

## 2024-09-14 DIAGNOSIS — I5022 Chronic systolic (congestive) heart failure: Secondary | ICD-10-CM

## 2024-09-14 NOTE — Telephone Encounter (Signed)
 Received labs potassium 4.4   Bun/creat 17/1.57   Reviewed by Harlene Gainer NP I spoke with the patient about this. Spoke to patient and  lab work to be done at [facility]. Orders in.

## 2024-09-15 ENCOUNTER — Other Ambulatory Visit (HOSPITAL_COMMUNITY): Payer: Self-pay | Admitting: Cardiology

## 2024-09-16 NOTE — Telephone Encounter (Signed)
Left msg for pt to return call to discuss.

## 2024-09-19 ENCOUNTER — Telehealth: Payer: Self-pay | Admitting: Student

## 2024-09-19 NOTE — Telephone Encounter (Signed)
 Attempted to return pt call, but did not get an answer and voicemail was full.

## 2024-09-19 NOTE — Telephone Encounter (Signed)
 Pt states that she is still having a vibration feeling from her device. Pt states that when she was in last week she was advised to call office. Please advise

## 2024-09-20 ENCOUNTER — Ambulatory Visit: Admitting: Gastroenterology

## 2024-09-20 NOTE — Telephone Encounter (Signed)
 See mychart encounter, I attempted to contact pt today to schedule an appt but did not get an answer. I left her a VM as well as sent her a Radio broadcast assistant.

## 2024-09-23 ENCOUNTER — Ambulatory Visit: Attending: Student | Admitting: Student

## 2024-09-23 DIAGNOSIS — I5022 Chronic systolic (congestive) heart failure: Secondary | ICD-10-CM

## 2024-09-23 NOTE — Patient Instructions (Signed)
 Medication Instructions:  Your physician recommends that you continue on your current medications as directed. Please refer to the Current Medication list given to you today.  *If you need a refill on your cardiac medications before your next appointment, please call your pharmacy*  Lab Work: None ordered If you have labs (blood work) drawn today and your tests are completely normal, you will receive your results only by: MyChart Message (if you have MyChart) OR A paper copy in the mail If you have any lab test that is abnormal or we need to change your treatment, we will call you to review the results.  Follow-Up: At [facility], you and your health needs are our priority.  As part of our continuing mission to provide you with exceptional heart care, our providers are all part of one team.  This team includes your primary Cardiologist (physician) and Advanced Practice Providers or APPs (Physician Assistants and Nurse Practitioners) who all work together to provide you with the care you need, when you need it.  Your next appointment:   12/20/24  Provider:   Ozell Jodie Passey, PA-C

## 2024-09-23 NOTE — Progress Notes (Signed)
  Pt with recurrent stim after programming at last visit.   Initially had increased from 7.6 to 9.0. Pt had recurrent stim prior to leaving and was reduced to 8.4.      Reduced today to 8.0.   Recurrent stim is vibration and aching behind her R ear and up into her head.   Not currently reproducible at new decrease and intermittent at baseline.   Follow up as scheduled.

## 2024-09-30 ENCOUNTER — Ambulatory Visit

## 2024-09-30 DIAGNOSIS — I5022 Chronic systolic (congestive) heart failure: Secondary | ICD-10-CM

## 2024-09-30 LAB — BASIC METABOLIC PANEL WITH GFR
BUN/Creatinine Ratio: 17 (ref 12–28)
BUN: 31 mg/dL — ABNORMAL HIGH (ref 8–27)
CO2: 24 mmol/L (ref 20–29)
Calcium: 9.7 mg/dL (ref 8.7–10.3)
Chloride: 99 mmol/L (ref 96–106)
Creatinine, Ser: 1.82 mg/dL — ABNORMAL HIGH (ref 0.57–1.00)
Glucose: 148 mg/dL — ABNORMAL HIGH (ref 70–99)
Potassium: 4.8 mmol/L (ref 3.5–5.2)
Sodium: 136 mmol/L (ref 134–144)
eGFR: 29 mL/min/1.73 — ABNORMAL LOW (ref >59)

## 2024-10-01 LAB — CUP PACEART REMOTE DEVICE CHECK
Battery Remaining Longevity: 90 mo
Battery Remaining Percentage: 100 %
Brady Statistic RA Percent Paced: 0 %
Brady Statistic RV Percent Paced: 100 %
Date Time Interrogation Session: 20251031032500
HighPow Impedance: 94 Ohm
Implantable Lead Connection Status: 753985
Implantable Lead Connection Status: 753985
Implantable Lead Connection Status: 753985
Implantable Lead Implant Date: 20220131
Implantable Lead Implant Date: 20220131
Implantable Lead Implant Date: 20220131
Implantable Lead Location: 753858
Implantable Lead Location: 753859
Implantable Lead Location: 753860
Implantable Lead Model: 137
Implantable Lead Model: 3830
Implantable Lead Model: 7840
Implantable Lead Serial Number: 1047234
Implantable Lead Serial Number: 301179
Implantable Pulse Generator Implant Date: 20220131
Lead Channel Impedance Value: 550 Ohm
Lead Channel Impedance Value: 565 Ohm
Lead Channel Impedance Value: 666 Ohm
Lead Channel Setting Pacing Amplitude: 0.1 V
Lead Channel Setting Pacing Amplitude: 2.5 V
Lead Channel Setting Pacing Amplitude: 2.5 V
Lead Channel Setting Pacing Pulse Width: 0.1 ms
Lead Channel Setting Pacing Pulse Width: 0.4 ms
Lead Channel Setting Sensing Sensitivity: 0.5 mV
Lead Channel Setting Sensing Sensitivity: 1 mV
Pulse Gen Serial Number: 149649
Zone Setting Status: 755011

## 2024-10-04 ENCOUNTER — Telehealth (HOSPITAL_COMMUNITY): Payer: Self-pay

## 2024-10-04 ENCOUNTER — Other Ambulatory Visit (HOSPITAL_COMMUNITY): Payer: Self-pay

## 2024-10-04 DIAGNOSIS — I5022 Chronic systolic (congestive) heart failure: Secondary | ICD-10-CM

## 2024-10-04 NOTE — Telephone Encounter (Signed)
 Spoke to patient regarding lab work. Aware of repeat lab work in 2  weeks. Patient to go to [facility] in [place].

## 2024-10-05 NOTE — Progress Notes (Signed)
 Remote ICD Transmission

## 2024-10-06 ENCOUNTER — Ambulatory Visit: Payer: Self-pay | Admitting: Internal Medicine

## 2024-10-07 ENCOUNTER — Other Ambulatory Visit (HOSPITAL_COMMUNITY): Payer: Self-pay | Admitting: Family Medicine

## 2024-10-10 NOTE — Telephone Encounter (Signed)
 Left msg for pt to return call.

## 2024-10-11 ENCOUNTER — Ambulatory Visit (HOSPITAL_COMMUNITY): Payer: Self-pay | Admitting: Family Medicine

## 2024-10-11 ENCOUNTER — Encounter (HOSPITAL_COMMUNITY): Payer: Self-pay

## 2024-10-11 LAB — BASIC METABOLIC PANEL WITH GFR
BUN/Creatinine Ratio: 14 (ref 12–28)
BUN: 25 mg/dL (ref 8–27)
CO2: 22 mmol/L (ref 20–29)
Calcium: 9.6 mg/dL (ref 8.7–10.3)
Chloride: 100 mmol/L (ref 96–106)
Creatinine, Ser: 1.77 mg/dL — ABNORMAL HIGH (ref 0.57–1.00)
Glucose: 119 mg/dL — ABNORMAL HIGH (ref 70–99)
Potassium: 4.7 mmol/L (ref 3.5–5.2)
Sodium: 138 mmol/L (ref 134–144)
eGFR: 30 mL/min/1.73 — ABNORMAL LOW (ref >59)

## 2024-10-11 NOTE — Progress Notes (Unsigned)
 Lab work received  Potassium 4.7  BUN/Creat 25/1.77  Reviewed by DOROTHA Gainer NP

## 2024-10-12 ENCOUNTER — Telehealth (HOSPITAL_COMMUNITY): Payer: Self-pay

## 2024-10-12 NOTE — Progress Notes (Signed)
 ADVANCED HF CLINIC NOTE PCP: Silver Lamar LABOR, MD Cardiology: Dr. Sheena HF Cardiology: Dr. Rolan   HPI: Karen Warner is 73 y.o. with history of CAD and ischemic cardiomyopathy and COPD. Patient had PCI to mid/distal LAD in 2020, then had unstable angina in 9/21 with DES to 70% stenosis proximal LAD.  She has had a cardiomyopathy for a number of years, [employer] CRT-D device placed in 2022.  Last echo in 8/24 showed EF < 20%, moderate LV dilation, mild LVH, RV normal.  She additionally has a diagnosis of COPD, quit smoking in 2021. She lives in [place], originally from [place]  but moved here to be near family.   LHC/RHC in 11/24 showed patent LAD stents with distal edge dissection in the apical LAD (no change from prior films), normal filling pressures, CI low at 2.12.   S/p barostimulation activation therapy device implant 2/25.  She returns today for heart failure follow up. Overall feeling well from a HF standpoint. Has lost ~20-25 lbs on Ozmepic, congratulated. Since then she has been able to ambulate further and shortness of breath has been improving. Reports that she is having stim from barostim, described as hearing buzzing. Has seen EP multiple times for turn downs. NYHA II-III, limited mostly by pain. Biggest compliant is pinched nerve in the L shoulder, she has not had a lot of relief despite interventions. Denies chest pain, orthopnea, palpitations, and dizziness. Able to perform ADLs. Appetite okay. Compliant with all medications.   Labs (7/24): K 4, creatinine 1.45 Labs (10/24): K 4.1, creatinine 1.6, LDL 74 Labs (12/24): LDL 41 Labs (2/25): K 3.5, creatinine 1.30 Labs (5/25): K 3.9, creatinine 1.24, LDL 48 Labs (6/25): K 3.2, creatinine 1.42 Labs (7/25): K 4.1, creatinine 1.63  PMH: 1. Fe deficiency anemia 2. OSA: Does not tolerate CPAP.  3. HTN 4. IBS 5. COPD: Quit smoking in 2021.  6. Depression  7. Type 2 diabetes 8. CAD: PCI mid-distal LAD  in 2020.  - LHC (9/21): Unstable angina, 70% proximal LAD treated with DES.  - LHC (11/24): Patent LAD stents with distal edge dissection in the apical LAD (no change from prior films) 9. Chronic systolic CHF: Suspect ischemic cardiomyopathy.  [employer] CRT-D device.  - Echo (8/22): EF 25-30% - Echo (3/23): EF 30% - Echo (8/24): EF < 20%, moderate LV dilation, mild LVH, RV normal - RHC (11/24): mean RA 3, PA 35/9, mean PCWP 9, CI 2.12, PAPi 8.7 - s/p  barostim 2/25 10. CKD stage 3 11. PAD: ABIs moderately decreased on right, mildly decreased on left.   FH: No sudden death or cardiomyopathy  Social History   Socioeconomic History   Marital status: Significant Other    Spouse name: Not on file   Number of children: 3   Years of education: Not on file   Highest education level: Not on file  Occupational History   Occupation: retired  Tobacco Use   Smoking status: Former    Current packs/day: 0.00    Types: Cigarettes    Quit date: 2021    Years since quitting: 4.8   Smokeless tobacco: Never  Vaping Use   Vaping status: Never Used  Substance and Sexual Activity   Alcohol use: Never   Drug use: Never   Sexual activity: Not on file  Other Topics Concern   Not on file  Social History Narrative   Not on file   Social Drivers of Health   Financial Resource Strain:  Low Risk  (05/18/2024)   Received from [facility]   Overall Financial Resource Strain (CARDIA)    Difficulty of Paying Living Expenses: Not hard at all  Food Insecurity: Low Risk  (10/07/2024)   Received from [facility]   Hunger Vital Sign    Within the past 12 months, you worried that your food would run out before you got money to buy more: Never true    Within the past 12 months, the food you bought just didn't last and you didn't have money to get more. : Never true  Transportation Needs: No Transportation Needs (10/07/2024)   Received from [facility]    In the past 12  months, has lack of reliable transportation kept you from medical appointments, meetings, work or from getting things needed for daily living? : No  Physical Activity: Not on file  Stress: Not on file  Social Connections: Not on file  Intimate Partner Violence: Not on file   ROS: All systems reviewed and negative except as per HPI.   Current Outpatient Medications  Medication Sig Dispense Refill   acetaminophen  (TYLENOL ) 650 MG CR tablet Take 650 mg by mouth every 8 (eight) hours as needed for pain.     albuterol  (PROVENTIL ) (2.5 MG/3ML) 0.083% nebulizer solution Take 2.5 mg by nebulization every 4 (four) hours as needed for wheezing or shortness of breath.     aspirin  81 MG EC tablet Take 81 mg by mouth daily.     BD PEN NEEDLE NANO 2ND GEN 32G X 4 MM MISC 3 (three) times daily.     carvedilol  (COREG ) 3.125 MG tablet Take 1 tablet (3.125 mg total) by mouth 2 (two) times daily with a meal. 180 tablet 3   clopidogrel  (PLAVIX ) 75 MG tablet Take 1 tablet (75 mg total) by mouth daily. TAKE 1 TABLET(75 MG) BY MOUTH DAILY 90 tablet 2   Continuous Glucose Sensor (DEXCOM G7 SENSOR) MISC SMARTSIG:1 Topical Every 10 Days     cyclobenzaprine (FLEXERIL) 5 MG tablet Take 5 mg by mouth 3 (three) times daily as needed.     dapagliflozin  propanediol (FARXIGA ) 10 MG TABS tablet TAKE 1 TABLET(10 MG) BY MOUTH DAILY BEFORE BREAKFAST 90 tablet 3   dicyclomine  (BENTYL ) 10 MG capsule TAKE 1 CAPSULE(10 MG) BY MOUTH IN THE MORNING AND AT BEDTIME 180 capsule 0   digoxin  (LANOXIN ) 0.125 MG tablet TAKE ONE-HALF TABLET BY MOUTH EVERY DAY 45 tablet 1   ezetimibe  (ZETIA ) 10 MG tablet Take 1 tablet (10 mg total) by mouth daily. 30 tablet 6   famotidine  (PEPCID ) 40 MG tablet TAKE 1 TABLET(40 MG) BY MOUTH TWICE DAILY 60 tablet 2   folic acid  (FOLVITE ) 1 MG tablet Take 1 mg by mouth daily.     insulin  lispro (HUMALOG) 100 UNIT/ML KwikPen Inject 2-20 Units into the skin 3 (three) times daily with meals.  per sliding scale      isosorbide  mononitrate (IMDUR ) 30 MG 24 hr tablet Take 15 mg by mouth daily.     lidocaine  (LIDODERM ) 5 % Place 1 patch onto the skin daily. Remove & Discard patch within 12 hours or as directed by MD 5 patch 0   loratadine (CLARITIN) 10 MG tablet Take 10 mg by mouth daily.     losartan  (COZAAR ) 25 MG tablet Take 0.5 tablets (12.5 mg total) by mouth daily. 45 tablet 3   nitroGLYCERIN  (NITROSTAT ) 0.4 MG SL tablet DISSOLVE 1 TABLET UNDER TONGUE EVERY 5 MINUTES AS  NEEDED FOR CHEST PAIN. 25 tablet 3   OZEMPIC, 0.25 OR 0.5 MG/DOSE, 2 MG/3ML SOPN Inject 0.5 mg into the skin every Monday.     pantoprazole  (PROTONIX ) 40 MG tablet Take 1 tablet (40 mg total) by mouth daily. TAKE 1 TABLET(40 MG) BY MOUTH DAILY 90 tablet 2   Plecanatide  (TRULANCE ) 3 MG TABS Take 1 tablet by mouth as needed.     ranolazine  (RANEXA ) 500 MG 12 hr tablet Take 1 tablet (500 mg total) by mouth 2 (two) times daily. 180 tablet 3   rosuvastatin  (CRESTOR ) 40 MG tablet Take 40 mg by mouth at bedtime.     spironolactone  (ALDACTONE ) 25 MG tablet Take 1 tablet (25 mg total) by mouth daily. 30 tablet 3   torsemide  (DEMADEX ) 20 MG tablet Take 1 tablet (20 mg total) by mouth 2 (two) times daily. 40 mg in the morning and 20 mg in the evening, alternating with 20 mg Twice daily 180 tablet 3   TRESIBA FLEXTOUCH 200 UNIT/ML FlexTouch Pen Inject 35 Units into the skin 2 (two) times daily.     TRINTELLIX  5 MG TABS tablet Take 5 mg by mouth at bedtime.     Ubrogepant 50 MG TABS Take 50 mg by mouth daily as needed (Headache).     valACYclovir (VALTREX) 500 MG tablet Take 500 mg by mouth daily.     vitamin B-12 (CYANOCOBALAMIN ) 1000 MCG tablet Take 1,000 mcg by mouth daily.     Vitamin D, Ergocalciferol, (DRISDOL) 1.25 MG (50000 UNIT) CAPS capsule Take 50,000 Units by mouth every 7 (seven) days.     No current facility-administered medications for this encounter.   Wt Readings from Last 3 Encounters:  10/13/24 76.8 kg (169 lb 6.4 oz)  09/13/24  79.3 kg (174 lb 12.8 oz)  09/01/24 82.6 kg (182 lb 3.2 oz)   BP 102/64   Pulse 82   Ht 5' 5 (1.651 m)   Wt 76.8 kg (169 lb 6.4 oz)   SpO2 99%   BMI 28.19 kg/m   Physical Exam: General: Well appearing. No distress on RA Cardiac: JVP flat. S1 and S2 present. No murmurs Resp: Lung sounds clear and equal B/L Extremities: Warm and dry.  No edema.  Neuro: Alert and oriented x3. Affect pleasant.   Geographical Information Systems Officer (personally reviewed): HeartLogic Score activity lvl 0.3 h/d, 100% BiV pacing, 0 AF/AT  Assessment/Plan: 1. Chronic systolic CHF: Ischemic cardiomyopathy.  [employer] CRT-D device.  Echo in 8/24 showed EF < 20%, moderate LV dilation, mild LVH, RV normal.  This is somewhat worse than priors. LHC/RHC in 11/24 showed low CI at 2.12, normal filling pressures, unchanged coronary anatomy with no severe stenoses. S/p barostimulation activation therapy device 2/25. Echo from [facility] showed EF 20-25%, paradoxical LV motion. - NYHA class II-III.  Though weight is down, she is mildly volume overloaded on exam with HeartLogic score 20. Low BP has limited GDMT titration.  - Continue spironolactone  to 25 mg daily.   - Decrease torsemide  to 20 mg daily - Continue losartan  12.5 mg daily. BP elevated in clinic today, however SBP at home in 100-110s. - Continue Coreg  3.125 mg bid.  - She stopped Farxiga  due to multiple yeast infections.  - Continue digoxin  0.0625 mg daily.  - Labs reviewed from 10/10/24. Cr 1.77 2.  CAD: PCI to mid/distal LAD in 2020 and proximal LAD in 2021.  LHC in 11/24 showed stable coronary anatomy with no severe stenoses. No chest pain. -  Continue ranolazine  500 mg bid. - Continue ASA/Plavix .  - Continue Crestor  + Zetia , goal LDL < 55. Check lipids.  3. COPD: History of smoking, quit 2021.   4. CKD 3: Follows with nephology at [facility]. Avoid hypotension.  5. Obesity: Body mass index is 28.19 kg/m. - Continue semaglutide. - has lost  ~20-25 lbs, congratulated 6. PAD: Moderately decreased ABI on right in 7/25.  No claudication.  - She saw Dr. Serene, plan medical management.   Follow up in 2 months with APP  Catharine Kettlewell, NP 10/13/2024

## 2024-10-12 NOTE — Telephone Encounter (Signed)
 Called to confirm/remind patient of their appointment at the [facility] on 10/13/24.   Appointment:   [] Confirmed  [x] Left mess   [] No answer/No voice mail  [] VM Full/unable to leave message  [] Phone not in service  And to bring in all medications and/or complete list.

## 2024-10-13 ENCOUNTER — Encounter (HOSPITAL_COMMUNITY): Payer: Self-pay

## 2024-10-13 ENCOUNTER — Other Ambulatory Visit (HOSPITAL_COMMUNITY): Payer: Self-pay | Admitting: Family Medicine

## 2024-10-13 ENCOUNTER — Other Ambulatory Visit (HOSPITAL_COMMUNITY): Payer: Self-pay

## 2024-10-13 ENCOUNTER — Ambulatory Visit (HOSPITAL_COMMUNITY)
Admission: RE | Admit: 2024-10-13 | Discharge: 2024-10-13 | Disposition: A | Discharge status: Home / Self Care | Source: Ambulatory Visit | Attending: Cardiology | Admitting: Cardiology

## 2024-10-13 VITALS — BP 102/64 | HR 82 | Ht 65.0 in | Wt 169.4 lb

## 2024-10-13 DIAGNOSIS — E669 Obesity, unspecified: Secondary | ICD-10-CM | POA: Diagnosis not present

## 2024-10-13 DIAGNOSIS — I739 Peripheral vascular disease, unspecified: Secondary | ICD-10-CM | POA: Diagnosis not present

## 2024-10-13 DIAGNOSIS — I5022 Chronic systolic (congestive) heart failure: Secondary | ICD-10-CM | POA: Diagnosis present

## 2024-10-13 DIAGNOSIS — Z7982 Long term (current) use of aspirin: Secondary | ICD-10-CM | POA: Insufficient documentation

## 2024-10-13 DIAGNOSIS — Z87891 Personal history of nicotine dependence: Secondary | ICD-10-CM | POA: Insufficient documentation

## 2024-10-13 DIAGNOSIS — I130 Hypertensive heart and chronic kidney disease with heart failure and stage 1 through stage 4 chronic kidney disease, or unspecified chronic kidney disease: Secondary | ICD-10-CM | POA: Insufficient documentation

## 2024-10-13 DIAGNOSIS — Z6828 Body mass index (BMI) 28.0-28.9, adult: Secondary | ICD-10-CM | POA: Insufficient documentation

## 2024-10-13 DIAGNOSIS — E1151 Type 2 diabetes mellitus with diabetic peripheral angiopathy without gangrene: Secondary | ICD-10-CM | POA: Diagnosis not present

## 2024-10-13 DIAGNOSIS — Z794 Long term (current) use of insulin: Secondary | ICD-10-CM | POA: Diagnosis not present

## 2024-10-13 DIAGNOSIS — Z7902 Long term (current) use of antithrombotics/antiplatelets: Secondary | ICD-10-CM | POA: Diagnosis not present

## 2024-10-13 DIAGNOSIS — I255 Ischemic cardiomyopathy: Secondary | ICD-10-CM | POA: Insufficient documentation

## 2024-10-13 DIAGNOSIS — G588 Other specified mononeuropathies: Secondary | ICD-10-CM | POA: Diagnosis not present

## 2024-10-13 DIAGNOSIS — I2510 Atherosclerotic heart disease of native coronary artery without angina pectoris: Secondary | ICD-10-CM | POA: Diagnosis not present

## 2024-10-13 DIAGNOSIS — J449 Chronic obstructive pulmonary disease, unspecified: Secondary | ICD-10-CM | POA: Diagnosis not present

## 2024-10-13 DIAGNOSIS — Z7985 Long-term (current) use of injectable non-insulin antidiabetic drugs: Secondary | ICD-10-CM | POA: Diagnosis not present

## 2024-10-13 DIAGNOSIS — Z955 Presence of coronary angioplasty implant and graft: Secondary | ICD-10-CM | POA: Diagnosis not present

## 2024-10-13 DIAGNOSIS — N183 Chronic kidney disease, stage 3 unspecified: Secondary | ICD-10-CM | POA: Diagnosis not present

## 2024-10-13 DIAGNOSIS — E1122 Type 2 diabetes mellitus with diabetic chronic kidney disease: Secondary | ICD-10-CM | POA: Diagnosis not present

## 2024-10-13 DIAGNOSIS — E8770 Fluid overload, unspecified: Secondary | ICD-10-CM | POA: Diagnosis not present

## 2024-10-13 DIAGNOSIS — Z79899 Other long term (current) drug therapy: Secondary | ICD-10-CM | POA: Diagnosis not present

## 2024-10-13 MED ORDER — TORSEMIDE 20 MG PO TABS: 20.0000 mg | ORAL_TABLET | Freq: Every day | ORAL | 3 refills | Status: DC

## 2024-10-13 NOTE — Patient Instructions (Addendum)
 Good to see you today!  DECREASE torsemide  to 20 mg  daily  [facility]  Your physician recommends that you schedule a follow-up appointment 2 months as scheduled  If you have any questions or concerns before your next appointment please send us  a message through [facility] or call our office at [phone].    TO LEAVE A MESSAGE FOR THE NURSE SELECT OPTION 2, PLEASE LEAVE A MESSAGE INCLUDING: YOUR NAME DATE OF BIRTH CALL BACK NUMBER REASON FOR CALL**this is important as we prioritize the call backs  YOU WILL RECEIVE A CALL BACK THE SAME DAY AS LONG AS YOU CALL BEFORE 4:00 PM At the [facility], you and your health needs are our priority. As part of our continuing mission to provide you with exceptional heart care, we have created designated Provider Care Teams. These Care Teams include your primary Cardiologist (physician) and Advanced Practice Providers (APPs- Physician Assistants and Nurse Practitioners) who all work together to provide you with the care you need, when you need it.   You may see any of the following providers on your designated Care Team at your next follow up: Dr Toribio Fuel Dr Ezra Shuck Dr. Morene Brownie Greig Mosses, NP Caffie Shed, [place] [facility] [place], [place] Beckey Coe, NP Jordan Lee, NP Ellouise Class, NP Tinnie Redman, PharmD Jaun Bash, PharmD   Please be sure to bring in all your medications bottles to every appointment.    Thank you for choosing [facility]

## 2024-10-19 ENCOUNTER — Encounter: Payer: Self-pay | Admitting: Cardiology

## 2024-10-19 ENCOUNTER — Ambulatory Visit: Attending: Cardiology | Admitting: Cardiology

## 2024-10-19 VITALS — BP 108/64 | HR 83 | Ht 65.0 in | Wt 169.8 lb

## 2024-10-19 DIAGNOSIS — I2510 Atherosclerotic heart disease of native coronary artery without angina pectoris: Secondary | ICD-10-CM | POA: Insufficient documentation

## 2024-10-19 DIAGNOSIS — I5022 Chronic systolic (congestive) heart failure: Secondary | ICD-10-CM | POA: Insufficient documentation

## 2024-10-19 DIAGNOSIS — I255 Ischemic cardiomyopathy: Secondary | ICD-10-CM | POA: Diagnosis present

## 2024-10-19 DIAGNOSIS — Z79899 Other long term (current) drug therapy: Secondary | ICD-10-CM | POA: Diagnosis present

## 2024-10-19 DIAGNOSIS — R0609 Other forms of dyspnea: Secondary | ICD-10-CM | POA: Insufficient documentation

## 2024-10-19 DIAGNOSIS — R002 Palpitations: Secondary | ICD-10-CM | POA: Insufficient documentation

## 2024-10-19 NOTE — Patient Instructions (Addendum)
 Medication Instructions:  Your physician recommends that you continue on your current medications as directed. Please refer to the Current Medication list given to you today.  *If you need a refill on your cardiac medications before your next appointment, please call your pharmacy*  Lab Work: Your physician has recommended you make the following change in your medication:  Mag, TSH, Digoxin   If you have labs (blood work) drawn today and your tests are completely normal, you will receive your results only by: MyChart Message (if you have MyChart) OR A paper copy in the mail If you have any lab test that is abnormal or we need to change your treatment, we will call you to review the results.  Testing/Procedures: Your physician has requested that you have an echocardiogram - Jan 2026. Echocardiography is a painless test that uses sound waves to create images of your heart. It provides your doctor with information about the size and shape of your heart and how well your heart's chambers and valves are working. This procedure takes approximately one hour. There are no restrictions for this procedure. Please do NOT wear cologne, perfume, aftershave, or lotions (deodorant is allowed). Please arrive 15 minutes prior to your appointment time.  Please note: We ask at that you not bring children with you during ultrasound (echo/ vascular) testing. Due to room size and safety concerns, children are not allowed in the ultrasound rooms during exams. Our front office staff cannot provide observation of children in our lobby area while testing is being conducted. An adult accompanying a patient to their appointment will only be allowed in the ultrasound room at the discretion of the ultrasound technician under special circumstances. We apologize for any inconvenience.   Follow-Up: At [facility], you and your health needs are our priority.  As part of our continuing mission to provide you with  exceptional heart care, our providers are all part of one team.  This team includes your primary Cardiologist (physician) and Advanced Practice Providers or APPs (Physician Assistants and Nurse Practitioners) who all work together to provide you with the care you need, when you need it.  Your next appointment:   6 month(s)  Provider:   Kardie Tobb, DO

## 2024-10-19 NOTE — Progress Notes (Unsigned)
 Cardiology Office Note:    Date:  10/25/2024   ID:  Karen Warner Karen Warner, DOB [birth year], MRN [id number]  PCP:  Silver Lamar LABOR, MD  Cardiologist:  Dub Huntsman, DO  Electrophysiologist:  Danelle Birmingham, MD   Referring MD: Silver Lamar LABOR, MD    I am having some vibration since this thing was implanted   History of Present Illness:    Karen Warner is a 73 y.o. female with a hx of chronic systolic CHF status post BIV ICD and Barostim, ICM, CAD, HTN, HLD, COPD, OSA not on CPAP, DM2, and GERD   In October it was noted that she has recurrent barostim vibration feeling up to the her head and right ear. The still was adjusted at that time to 8.  She has been following in the office mostly with EP. She reports that since the adjustment of her barostim she continues to feel the vibration.   Recently she was admitted to [facility] for heart failure exacerbation - at that time her echo showed EF 20-25%, Myoview /lexiscan  performed with no evidence of ischemia.   She is here today with her significant other.   No chest pain, admits she is deconditioned. But her main concern is the barostim.  Past Medical History:  Diagnosis Date   AICD (automatic cardioverter/defibrillator) present    Anemia 12/12/2019   Anginal pain    Arthralgia 01/14/2020   Arthritis    Asthma    Atherosclerotic heart disease of native coronary artery without angina pectoris 04/13/2014   CHF (congestive heart failure) (HCC)    Chronic bilateral low back pain with bilateral sciatica 01/13/2019   Chronic bladder pain 11/08/2014   Last Assessment & Plan:  Formatting of this note might be different from the original. Patient has chronic pain syndrome in general I think this is unfortunately worsening her pelvic pain and bladder pain her examination today was very reassuring I am advised her to use the estrogen cream I did start her on Elavil 10 milligrams at that time if she does tolerated will  increase it to 25 milligrams.    Chronic headaches    Chronic idiopathic constipation 12/22/2014   Formatting of this note might be different from the original. Last Assessment & Plan:  For better bowel emptying please use either Citrucel / Benefiber start with 2 tablespoons daily and titrate up or down to effect.  Also please use glycerin  suppositories as needed to assist in evacuation. Last Assessment & Plan:  Formatting of this note might be different from the original. For better bowel empt   Chronic obstructive pulmonary disease, unspecified (HCC) 03/18/2018   CKD (chronic kidney disease) stage 3, GFR 30-59 ml/min (HCC)    Class 1 obesity due to excess calories with serious comorbidity and body mass index (BMI) of 31.0 to 31.9 in adult 06/26/2020   Colon polyps    COPD (chronic obstructive pulmonary disease) (HCC)    Coronary artery disease    DDD (degenerative disc disease), cervical 01/13/2019   Depression 11/05/2019   Diabetic polyneuropathy associated with type 2 diabetes mellitus (HCC) 02/24/2019   Diverticulitis 05/20/2018   Diverticulitis of colon 03/18/2018   Family history of ischemic heart disease (IHD) 08/16/2013   Fibromyalgia    Fibromyalgia affecting multiple sites 01/14/2020   Gastro-esophageal reflux disease without esophagitis 01/13/2019   Glaucoma 11/08/2014   Hordeolum externum of left upper eyelid 03/13/2020   Hypertension 11/08/2014   IBS (irritable bowel syndrome)    Iron  deficiency anemia 01/13/2019   Left renal mass 11/08/2019   Leukocytosis 05/28/2018   Low vitamin B12 level 03/13/2020   Low vitamin D level 12/12/2019   LV dysfunction 05/31/2019   Malignant essential hypertension 01/22/2016   Migraine, unspecified, not intractable, without status migrainosus 11/08/2014   Mixed hyperlipidemia 01/13/2019   Myocardial infarction (HCC)    Other premature beats 11/08/2014   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 Last Assessment & Plan:  For vaginal atrophy please place a pea size dab of Estrogen vaginal cream  into the vagina 3 times a week ( Monday, Wednesday, Friday) Last Assessment & Plan:  Formatting of this note might be different from the original. For vaginal atrophy please place a pea size dab of Estrogen vaginal cream  into the vagina     Past Surgical History:  Procedure Laterality Date   ABDOMINAL HYSTERECTOMY     BIV ICD INSERTION CRT-D N/A 12/31/2020   Procedure: BIV ICD INSERTION CRT-D;  Surgeon: Waddell Danelle ORN, MD;  Location: [facility] INVASIVE CV LAB;  Service: Cardiovascular;  Laterality: N/A;   BLEPHAROPLASTY Bilateral    COLON SURGERY     CORONARY ANGIOPLASTY WITH STENT PLACEMENT  2020   X3   CORONARY PRESSURE/FFR STUDY N/A 08/03/2020   Procedure: INTRAVASCULAR PRESSURE WIRE/FFR STUDY;  Surgeon: Wonda Sharper, MD;  Location: [facility] INVASIVE CV LAB;  Service: Cardiovascular;  Laterality: N/A;   CORONARY STENT INTERVENTION N/A 08/03/2020   Procedure: CORONARY STENT INTERVENTION;  Surgeon: Wonda Sharper, MD;  Location: [facility] INVASIVE CV LAB;  Service: Cardiovascular;  Laterality: N/A;   LEFT HEART CATH AND CORONARY ANGIOGRAPHY N/A 08/03/2020   Procedure: LEFT HEART CATH AND CORONARY ANGIOGRAPHY;  Surgeon: Wonda Sharper, MD;  Location: [facility] INVASIVE CV LAB;  Service: Cardiovascular;  Laterality: N/A;   RADIOFREQUENCY ABLATION  07/2023   REFRACTIVE SURGERY Left    [place]    RIGHT/LEFT HEART CATH AND CORONARY ANGIOGRAPHY N/A 10/13/2023   Procedure: RIGHT/LEFT HEART CATH AND CORONARY ANGIOGRAPHY;  Surgeon: Rolan Ezra RAMAN, MD;  Location: [facility] INVASIVE CV LAB;   Service: Cardiovascular;  Laterality: N/A;    Current Medications: Current Meds  Medication Sig   acetaminophen  (TYLENOL ) 650 MG CR tablet Take 650 mg by mouth every 8 (eight) hours as needed for pain.   albuterol  (PROVENTIL ) (2.5 MG/3ML) 0.083% nebulizer solution Take 2.5 mg by nebulization every 4 (four) hours as needed for wheezing or shortness of breath.   aspirin  81 MG EC tablet Take 81 mg by mouth daily.   BD PEN NEEDLE NANO 2ND GEN 32G X 4 MM MISC 3 (three) times daily.   carvedilol  (COREG ) 3.125 MG tablet Take 1 tablet (3.125 mg total) by mouth 2 (two) times daily with a meal.   clopidogrel  (PLAVIX ) 75 MG tablet Take 1 tablet (75 mg total) by mouth daily. TAKE 1 TABLET(75 MG) BY MOUTH DAILY   Continuous Glucose Sensor (DEXCOM G7 SENSOR) MISC SMARTSIG:1 Topical Every 10 Days   cyclobenzaprine (FLEXERIL) 5 MG tablet Take 5 mg by mouth 3 (three) times daily as needed.   dapagliflozin  propanediol (FARXIGA ) 10 MG TABS tablet TAKE 1 TABLET(10  MG) BY MOUTH DAILY BEFORE BREAKFAST   dicyclomine  (BENTYL ) 10 MG capsule TAKE 1 CAPSULE(10 MG) BY MOUTH IN THE MORNING AND AT BEDTIME   digoxin  (LANOXIN ) 0.125 MG tablet TAKE ONE-HALF TABLET BY MOUTH EVERY DAY   ezetimibe  (ZETIA ) 10 MG tablet Take 1 tablet (10 mg total) by mouth daily.   famotidine  (PEPCID ) 40 MG tablet TAKE 1 TABLET(40 MG) BY MOUTH TWICE DAILY   folic acid  (FOLVITE ) 1 MG tablet Take 1 mg by mouth daily.   insulin  lispro (HUMALOG) 100 UNIT/ML KwikPen Inject 2-20 Units into the skin 3 (three) times daily with meals.  per sliding scale   isosorbide  mononitrate (IMDUR ) 30 MG 24 hr tablet Take 15 mg by mouth daily.   lidocaine  (LIDODERM ) 5 % Place 1 patch onto the skin daily. Remove & Discard patch within 12 hours or as directed by MD   loratadine (CLARITIN) 10 MG tablet Take 10 mg by mouth daily.   losartan  (COZAAR ) 25 MG tablet Take 0.5 tablets (12.5 mg total) by mouth daily.   nitroGLYCERIN  (NITROSTAT ) 0.4 MG SL tablet DISSOLVE 1  TABLET UNDER TONGUE EVERY 5 MINUTES AS NEEDED FOR CHEST PAIN.   OZEMPIC, 0.25 OR 0.5 MG/DOSE, 2 MG/3ML SOPN Inject 0.5 mg into the skin every Monday.   pantoprazole  (PROTONIX ) 40 MG tablet Take 1 tablet (40 mg total) by mouth daily. TAKE 1 TABLET(40 MG) BY MOUTH DAILY   Plecanatide  (TRULANCE ) 3 MG TABS Take 1 tablet by mouth as needed.   ranolazine  (RANEXA ) 500 MG 12 hr tablet Take 1 tablet (500 mg total) by mouth 2 (two) times daily.   rosuvastatin  (CRESTOR ) 40 MG tablet Take 40 mg by mouth at bedtime.   spironolactone  (ALDACTONE ) 25 MG tablet Take 1 tablet (25 mg total) by mouth daily.   torsemide  (DEMADEX ) 20 MG tablet Take 1 tablet (20 mg total) by mouth daily. 40 mg in the morning and 20 mg in the evening, alternating with 20 mg Twice daily   TRESIBA FLEXTOUCH 200 UNIT/ML FlexTouch Pen Inject 35 Units into the skin 2 (two) times daily.   TRINTELLIX  5 MG TABS tablet Take 5 mg by mouth at bedtime.   Ubrogepant 50 MG TABS Take 50 mg by mouth daily as needed (Headache).   valACYclovir (VALTREX) 500 MG tablet Take 500 mg by mouth daily.   vitamin B-12 (CYANOCOBALAMIN ) 1000 MCG tablet Take 1,000 mcg by mouth daily.   Vitamin D, Ergocalciferol, (DRISDOL) 1.25 MG (50000 UNIT) CAPS capsule Take 50,000 Units by mouth every 7 (seven) days.     Allergies:   Bee venom, Sumatriptan, Amoxicillin-pot clavulanate, Oxycodone, Buprenorphine hcl, Clarithromycin, Duloxetine  hcl, Hydrocodone, Lactose, Liraglutide, and Tramadol hcl   Social History   Socioeconomic History   Marital status: Significant Other    Spouse name: Not on file   Number of children: 3   Years of education: Not on file   Highest education level: Not on file  Occupational History   Occupation: retired  Tobacco Use   Smoking status: Former    Current packs/day: 0.00    Types: Cigarettes    Quit date: 2021    Years since quitting: 4.9   Smokeless tobacco: Never  Vaping Use   Vaping status: Never Used  Substance and Sexual  Activity   Alcohol use: Never   Drug use: Never   Sexual activity: Not on file  Other Topics Concern   Not on file  Social History Narrative   Not on file   Social Drivers  of Health   Financial Resource Strain: Low Risk  (05/18/2024)   Received from [facility]   Overall Financial Resource Strain (CARDIA)    Difficulty of Paying Living Expenses: Not hard at all  Food Insecurity: Low Risk  (10/07/2024)   Received from [facility]   Hunger Vital Sign    Within the past 12 months, you worried that your food would run out before you got money to buy more: Never true    Within the past 12 months, the food you bought just didn't last and you didn't have money to get more. : Never true  Transportation Needs: No Transportation Needs (10/07/2024)   Received from [facility]    In the past 12 months, has lack of reliable transportation kept you from medical appointments, meetings, work or from getting things needed for daily living? : No  Physical Activity: Not on file  Stress: Not on file  Social Connections: Not on file     Family History: The patient's family history includes Colon polyps in her mother; Coronary artery disease in her mother; Diabetes in her daughter, maternal grandmother, and mother; Glaucoma in her mother; Heart disease in her father; Hypertension in her brother and mother; Lung cancer in her mother; Migraines in her father and son.  ROS:   Review of Systems  Constitution: Negative for decreased appetite, fever and weight gain.  HENT: Negative for congestion, ear discharge, hoarse voice and sore throat.   Eyes: Negative for discharge, redness, vision loss in right eye and visual halos.  Cardiovascular: Negative for chest pain, dyspnea on exertion, leg swelling, orthopnea and palpitations.  Respiratory: Negative for cough, hemoptysis, shortness of breath and snoring.   Endocrine: Negative for heat intolerance and polyphagia.   Hematologic/Lymphatic: Negative for bleeding problem. Does not bruise/bleed easily.  Skin: Negative for flushing, nail changes, rash and suspicious lesions.  Musculoskeletal: Negative for arthritis, joint pain, muscle cramps, myalgias, neck pain and stiffness.  Gastrointestinal: Negative for abdominal pain, bowel incontinence, diarrhea and excessive appetite.  Genitourinary: Negative for decreased libido, genital sores and incomplete emptying.  Neurological: Negative for brief paralysis, focal weakness, headaches and loss of balance.  Psychiatric/Behavioral: Negative for altered mental status, depression and suicidal ideas.  Allergic/Immunologic: Negative for HIV exposure and persistent infections.    EKGs/Labs/Other Studies Reviewed:    The following studies were reviewed today:   EKG:  None today   Recent Labs: 05/26/2024: ALT 17; Hemoglobin 12.2; Platelet Count 292 09/01/2024: B Natriuretic Peptide 169.4 10/10/2024: BUN 25; Creatinine, Ser 1.77; Potassium 4.7; Sodium 138 10/19/2024: Magnesium  2.3; TSH 0.851  Recent Lipid Panel    Component Value Date/Time   CHOL 107 09/01/2024 1457   TRIG 110 09/01/2024 1457   HDL 41 09/01/2024 1457   CHOLHDL 2.6 09/01/2024 1457   VLDL 22 09/01/2024 1457   LDLCALC 44 09/01/2024 1457    Physical Exam:    VS:  BP 108/64 (BP Location: Right Arm, Patient Position: Sitting, Cuff Size: Normal)   Pulse 83   Ht 5' 5 (1.651 m)   Wt 169 lb 12.8 oz (77 kg)   SpO2 97%   BMI 28.26 kg/m     Wt Readings from Last 3 Encounters:  10/19/24 169 lb 12.8 oz (77 kg)  10/13/24 169 lb 6.4 oz (76.8 kg)  09/13/24 174 lb 12.8 oz (79.3 kg)     GEN: Well nourished, well developed in no acute distress HEENT: Normal NECK: No JVD; No carotid bruits LYMPHATICS:  No lymphadenopathy CARDIAC: S1S2 noted,RRR, no murmurs, rubs, gallops RESPIRATORY:  Clear to auscultation without rales, wheezing or rhonchi  ABDOMEN: Soft, non-tender, non-distended, +bowel sounds,  no guarding. EXTREMITIES: No edema, No cyanosis, no clubbing MUSCULOSKELETAL:  No deformity  SKIN: Warm and dry NEUROLOGIC:  Alert and oriented x 3, non-focal PSYCHIATRIC:  Normal affect, good insight  ASSESSMENT:    1. Palpitations   2. Medication management   3. DOE (dyspnea on exertion)   4. Ischemic cardiomyopathy   5. Coronary artery disease involving native coronary artery of native heart without angina pectoris   6. Chronic systolic heart failure (HCC)    PLAN:    CAD- no anginal symptom. Continue current medication regime  Clinically she appears to be euvolemic. Her diuretics was recently adjusted.   Discussed the scarring around her device. She has a follow up with EP coming up soon.  No medication changes today  The patient is in agreement with the above plan. The patient left the office in stable condition.  The patient will follow up in   Medication Adjustments/Labs and Tests Ordered: Current medicines are reviewed at length with the patient today.  Concerns regarding medicines are outlined above.  Orders Placed This Encounter  Procedures   Magnesium    TSH+T4F+T3Free   Digoxin  level   ECHOCARDIOGRAM COMPLETE   No orders of the defined types were placed in this encounter.   Patient Instructions  Medication Instructions:  Your physician recommends that you continue on your current medications as directed. Please refer to the Current Medication list given to you today.  *If you need a refill on your cardiac medications before your next appointment, please call your pharmacy*  Lab Work: Your physician has recommended you make the following change in your medication:  Mag, TSH, Digoxin   If you have labs (blood work) drawn today and your tests are completely normal, you will receive your results only by: MyChart Message (if you have MyChart) OR A paper copy in the mail If you have any lab test that is abnormal or we need to change your treatment, we will call  you to review the results.  Testing/Procedures: Your physician has requested that you have an echocardiogram - Jan 2026. Echocardiography is a painless test that uses sound waves to create images of your heart. It provides your doctor with information about the size and shape of your heart and how well your heart's chambers and valves are working. This procedure takes approximately one hour. There are no restrictions for this procedure. Please do NOT wear cologne, perfume, aftershave, or lotions (deodorant is allowed). Please arrive 15 minutes prior to your appointment time.  Please note: We ask at that you not bring children with you during ultrasound (echo/ vascular) testing. Due to room size and safety concerns, children are not allowed in the ultrasound rooms during exams. Our front office staff cannot provide observation of children in our lobby area while testing is being conducted. An adult accompanying a patient to their appointment will only be allowed in the ultrasound room at the discretion of the ultrasound technician under special circumstances. We apologize for any inconvenience.   Follow-Up: At [facility], you and your health needs are our priority.  As part of our continuing mission to provide you with exceptional heart care, our providers are all part of one team.  This team includes your primary Cardiologist (physician) and Advanced Practice Providers or APPs (Physician Assistants and Nurse Practitioners) who all work together to  provide you with the care you need, when you need it.  Your next appointment:   6 month(s)  Provider:   Gaylord Seydel, DO     Adopting a Healthy Lifestyle.  Know what a healthy weight is for you (roughly BMI <25) and aim to maintain this   Aim for 7+ servings of fruits and vegetables daily   65-80+ fluid ounces of water or unsweet tea for healthy kidneys   Limit to max 1 drink of alcohol per day; avoid smoking/tobacco   Limit animal  fats in diet for cholesterol and heart health - choose grass fed whenever available   Avoid highly processed foods, and foods high in saturated/trans fats   Aim for low stress - take time to unwind and care for your mental health   Aim for 150 min of moderate intensity exercise weekly for heart health, and weights twice weekly for bone health   Aim for 7-9 hours of sleep daily   When it comes to diets, agreement about the perfect plan isnt easy to find, even among the experts. Experts at the [facility] of [employer] developed an idea known as the Healthy Eating Plate. Just imagine a plate divided into logical, healthy portions.   The emphasis is on diet quality:   Load up on vegetables and fruits - one-half of your plate: Aim for color and variety, and remember that potatoes dont count.   Go for whole grains - one-quarter of your plate: Whole wheat, barley, wheat berries, quinoa, oats, brown rice, and foods made with them. If you want pasta, go with whole wheat pasta.   Protein power - one-quarter of your plate: Fish, chicken, beans, and nuts are all healthy, versatile protein sources. Limit red meat.   The diet, however, does go beyond the plate, offering a few other suggestions.   Use healthy plant oils, such as olive, canola, soy, corn, sunflower and peanut. Check the labels, and avoid partially hydrogenated oil, which have unhealthy trans fats.   If youre thirsty, drink water. Coffee and tea are good in moderation, but skip sugary drinks and limit milk and dairy products to one or two daily servings.   The type of carbohydrate in the diet is more important than the amount. Some sources of carbohydrates, such as vegetables, fruits, whole grains, and beans-are healthier than others.   Finally, stay active  Signed, Dub Huntsman, DO  10/25/2024 2:41 PM    [facility]

## 2024-10-20 ENCOUNTER — Ambulatory Visit: Attending: Student | Admitting: Student

## 2024-10-20 DIAGNOSIS — I5022 Chronic systolic (congestive) heart failure: Secondary | ICD-10-CM

## 2024-10-20 LAB — TSH+T4F+T3FREE
Free T4: 0.94 ng/dL (ref 0.82–1.77)
T3, Free: 2.5 pg/mL (ref 2.0–4.4)
TSH: 0.851 u[IU]/mL (ref 0.450–4.500)

## 2024-10-20 LAB — DIGOXIN LEVEL: Digoxin, Serum: 0.6 ng/mL (ref 0.5–0.9)

## 2024-10-20 LAB — MAGNESIUM: Magnesium: 2.3 mg/dL (ref 1.6–2.3)

## 2024-10-20 NOTE — Patient Instructions (Signed)
 Medication Instructions:  No medication changes today. *If you need a refill on your cardiac medications before your next appointment, please call your pharmacy*  Lab Work: No labwork ordered today. If you have labs (blood work) drawn today and your tests are completely normal, you will receive your results only by: MyChart Message (if you have MyChart) OR A paper copy in the mail If you have any lab test that is abnormal or we need to change your treatment, we will call you to review the results.  Testing/Procedures: No testing ordered today  Follow-Up: At [facility], you and your health needs are our priority.  As part of our continuing mission to provide you with exceptional heart care, our providers are all part of one team.  This team includes your primary Cardiologist (physician) and Advanced Practice Providers or APPs (Physician Assistants and Nurse Practitioners) who all work together to provide you with the care you need, when you need it.  Your next appointment:   2 month(s) with Jodie Passey, PA-C  We recommend signing up for the patient portal called MyChart.  Sign up information is provided on this After Visit Summary.  MyChart is used to connect with patients for Virtual Visits (Telemedicine).  Patients are able to view lab/test results, encounter notes, upcoming appointments, etc.  Non-urgent messages can be sent to your provider as well.   To learn more about what you can do with MyChart, go to [email or web address].

## 2024-10-20 NOTE — Progress Notes (Signed)
  Pt continues to have stim from her barostim.  Pain in her neck and up into the top of her head.   Long discussion today.  Discussed options:  Turn down gently (~1.0 - 2.0 ma) HALVE therapy Turn device OFF for a month or two and assess symptoms.   Pt opted to HALVE therapy for now. Changed from 8.0 ma @ 65 us  to 4.0 ma.     Pt also complains of keloid and intermittent irritation in device pocket. Discussed that device removal is unlikely to completely relieve this symptoms, with the possibility of MORE scar tissue and even further aggravation.   Keep 2 month follow up for further device titration as needed.  She will call if she decides for a trial OFF therapy.   Ozell Jodie Passey, PA-C  10/20/2024 12:05 PM

## 2024-10-28 ENCOUNTER — Ambulatory Visit: Payer: Self-pay | Admitting: Cardiology

## 2024-11-04 ENCOUNTER — Telehealth: Payer: Self-pay | Admitting: *Deleted

## 2024-11-04 NOTE — Telephone Encounter (Signed)
   Pre-operative Risk Assessment    Patient Name: Karen Warner  DOB: 1950/12/19 MRN: [id number]   Date of last office visit: 10/20/2024 Date of next office visit: 12/20/2024  Request for Surgical Clearance    Procedure:  Injection  Date of Surgery:  Clearance 11/15/24                                Surgeon:  Dr. Darlean Surgeon's Group or Practice Name:  [facility].  Phone number:  [phone]  Fax number:  [phone]   Type of Clearance Requested:   - Medical  - Pharmacy:  Hold Clopidogrel  (Plavix ) 7 days prior.   Type of Anesthesia:  Not Indicated   Additional requests/questions:  sent to device team  Signed, Edsel Grayce Sanders   11/04/2024, 1:13 PM

## 2024-11-04 NOTE — Telephone Encounter (Signed)
 Given duration of Plavix  hold requested (7 days for selective nerve root block injection per OV note) and prior extensive CAD history, needs input from Dr. Sheena regarding holding of Plavix  for 7 days. 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 NST 08/2024 negative for ischemia. Significant cardiac comorbidities as noted. Dr. Sheena - you saw the patient in November 2025. May they proceed with injection and hold Plavix  for 7 days? Please route response to P CV DIV PREOP (the pre-op pool). Thank you.

## 2024-11-07 ENCOUNTER — Ambulatory Visit: Admitting: Podiatry

## 2024-11-07 ENCOUNTER — Encounter: Payer: Self-pay | Admitting: Internal Medicine

## 2024-11-07 NOTE — Progress Notes (Signed)
 PERIOPERATIVE PRESCRIPTION FOR IMPLANTED CARDIAC DEVICE PROGRAMMING  Patient Information: Name:  Karen Warner  DOB:  [birth year]  MRN:  [id number]  Procedure:  Injection   Date of Surgery:  Clearance 11/15/24                                Surgeon:  Dr. Darlean Surgeon's Group or Practice Name:  [facility].  Phone number:  [phone]   Fax number:  [phone]   Type of Clearance Requested:   - Medical  - Pharmacy:  Hold Clopidogrel  (Plavix ) 7 days prior.   Type of Anesthesia:  Not Indicated Device Information:  Clinic EP Physician:  Danelle Birmingham, MD   Device Type:  Defibrillator Manufacturer and Phone #:  [employer]: [phone] Pacemaker Dependent?:  No. Date of Last Device Check:  09/30/24 Normal Device Function?:  Yes.    Electrophysiologist's Recommendations:  Have magnet available. Provide continuous ECG monitoring when magnet is used or reprogramming is to be performed.  Procedure should not interfere with device function.  No device programming or magnet placement needed.  Per Device Clinic Standing Orders, Prentice JINNY Silvan, RN  3:54 PM 11/07/2024

## 2024-11-09 ENCOUNTER — Other Ambulatory Visit: Payer: Self-pay | Admitting: *Deleted

## 2024-11-09 ENCOUNTER — Ambulatory Visit (INDEPENDENT_AMBULATORY_CARE_PROVIDER_SITE_OTHER): Admitting: Gastroenterology

## 2024-11-09 ENCOUNTER — Encounter: Payer: Self-pay | Admitting: Gastroenterology

## 2024-11-09 VITALS — BP 118/68 | HR 96 | Ht 65.0 in | Wt 168.0 lb

## 2024-11-09 DIAGNOSIS — Z9049 Acquired absence of other specified parts of digestive tract: Secondary | ICD-10-CM | POA: Diagnosis not present

## 2024-11-09 DIAGNOSIS — Z860101 Personal history of adenomatous and serrated colon polyps: Secondary | ICD-10-CM

## 2024-11-09 DIAGNOSIS — Z7901 Long term (current) use of anticoagulants: Secondary | ICD-10-CM

## 2024-11-09 DIAGNOSIS — I509 Heart failure, unspecified: Secondary | ICD-10-CM

## 2024-11-09 DIAGNOSIS — K5909 Other constipation: Secondary | ICD-10-CM | POA: Diagnosis not present

## 2024-11-09 DIAGNOSIS — Z95810 Presence of automatic (implantable) cardiac defibrillator: Secondary | ICD-10-CM

## 2024-11-09 MED ORDER — TRULANCE 3 MG PO TABS: 1.0000 | ORAL_TABLET | ORAL | 1 refills | Status: AC | PRN

## 2024-11-09 NOTE — Progress Notes (Signed)
 Karen Warner [id number] 19-Jul-1951   Chief Complaint: Constipation  Referring Provider: Silver Lamar LABOR, MD Primary GI MD: Dr. Shila  HPI: Karen Warner is a 73 y.o. female with past medical history of CHF, COPD, CAD s/p stent on Plavix , ischemic cardiomyopathy s/p biventricular ICD placement,  IDA with intestinal AVMs, diverticular disease s/p left hemicolectomy, epiploic appendicitis, colon polyps, gastritis who presents today to discuss colonoscopy.    Seen in office 09/11/2023 by Vina Dasen, NP.  She has been seen several times in the office by multiple providers for chronic abdominal bloating and constipation.  She has tried multiple medications for constipation but they either do not work or caused diarrhea.  Previous therapies have included probiotics, MiraLAX , Benefiber, Amitiza , Linzess, and most recently Trulance , which patient is still taking once or twice a week.  After last visit patient was referred to pelvic floor physical therapy for possible pelvic floor dysfunction.  Had reported feeling an urge to have a bowel movement daily but felt that she could not evacuate her bowels.   Last seen in office 07/04/2024 to discuss repeat colonoscopy.  Noted to be due for 5-year recall after 10/06/2024.  Had chronic constipation which had been difficult to manage.  Was taking Trulance  1-2 times a week at time of last visit, which causes diarrhea when she takes it but does relieve abdominal bloating and produces a substantial bowel movement.  Denied any rectal bleeding or melena.  GERD symptoms controlled on medication and she denied dysphagia.  Given her significant cardiac history and admission to [facility] the previous week with chest pain, cardiac clearance was requested.  She is also on Plavix  and hold was requested.  We did receive permission to proceed with colonoscopy and hold Plavix  prior.  Plan was to have patient follow-up with Dr. Shila in  office to further discuss the appropriateness of colonoscopy.  Patient canceled that follow up appointment.  Had a visit with cardiology 10/19/2024.  Has Barostim implanted.  Has been noting vibrations up to her head and right ear since this was implanted.  Recent admission to [facility] for heart failure exacerbation, at that time echo showed EF of 20 to 25%.  She is scheduled for repeat echocardiogram on 12/19.  Denies any anginal symptoms, euvolemic with diuretics recently adjusted.  She did send a message to cardiology 11/08/2024 endorsing palpitations.   Discussed the use of AI scribe software for clinical note transcription with the patient, who gave verbal consent to proceed.  History of Present Illness Karen Warner is a 73 year old female with coronary artery disease and precancerous colon polyps who presents for follow-up regarding a repeat colonoscopy. Here today with her significant other.  Colorectal neoplasia surveillance - Last colonoscopy in 2020 at [facility] revealed a few precancerous polyps. - Follow-up for repeat colonoscopy is planned.  Constipation and glycemic control - Chronic constipation has improved since starting Ozempic. - Daily bowel movements are now present. - Occasional use of Trulance , especially after dietary changes such as Thanksgiving. - No rectal bleeding or abdominal pain. - Weight loss of 25-30 pounds since starting Ozempic. - Hemoglobin A1c decreased to 6.3, the lowest in ten years.  Cardiac symptoms and device management - History of three myocardial infarctions, five stents, pacemaker, defibrillator, and barostim device. - On Plavix  due to coronary artery disease and stent placement. - Palpitations occur often but not constantly. - Palpitations and chest pain are associated with the barostim device. -  Barostim device has been adjusted multiple times due to unexpected vibrations extending to her head. - Device is  currently turned down almost off. - No chest pain or shortness of breath currently. - No palpitations prior to device implantation.  Musculoskeletal pain - Pinched nerve in the neck; currently off Plavix  in preparation for an injection scheduled on December 16th to alleviate pain. - Tendinitis pain radiates from the shoulder down to the shoulder blade, with the worst pain in the middle of the shoulder blade. - Wears a wrist brace to prevent movement that exacerbates pain, but continues to experience discomfort.   Previous GI Procedures/Imaging   Colonoscopy 10/07/2019 [facility]) Diminutive colon polyps (3) A few scattered diverticula proximal to the anastomosis No source of active bleeding identified Path: Tubular adenomas (recall in 5 years recommended)   EGD 10/06/2019 [facility]) Hiatal hernia Asymptomatic 2 cm distal esophageal stricture No source of significant GI blood loss identified    Past Medical History:  Diagnosis Date   AICD (automatic cardioverter/defibrillator) present    Anemia 12/12/2019   Anginal pain    Arthralgia 01/14/2020   Arthritis    Asthma    Atherosclerotic heart disease of native coronary artery without angina pectoris 04/13/2014   CHF (congestive heart failure) (HCC)    Chronic bilateral low back pain with bilateral sciatica 01/13/2019   Chronic bladder pain 11/08/2014   Last Assessment & Plan:  Formatting of this note might be different from the original. Patient has chronic pain syndrome in general I think this is unfortunately worsening her pelvic pain and bladder pain her examination today was very reassuring I am advised her to use the estrogen cream I did start her on Elavil 10 milligrams at that time if she does tolerated will increase it to 25 milligrams.    Chronic headaches    Chronic idiopathic constipation 12/22/2014   Formatting of this note might be different from the original. Last Assessment & Plan:  For better bowel  emptying please use either Citrucel / Benefiber start with 2 tablespoons daily and titrate up or down to effect.  Also please use glycerin  suppositories as needed to assist in evacuation. Last Assessment & Plan:  Formatting of this note might be different from the original. For better bowel empt   Chronic obstructive pulmonary disease, unspecified (HCC) 03/18/2018   CKD (chronic kidney disease) stage 3, GFR 30-59 ml/min (HCC)    Class 1 obesity due to excess calories with serious comorbidity and body mass index (BMI) of 31.0 to 31.9 in adult 06/26/2020   Colon polyps    COPD (chronic obstructive pulmonary disease) (HCC)    Coronary artery disease    DDD (degenerative disc disease), cervical 01/13/2019   Depression 11/05/2019   Diabetic polyneuropathy associated with type 2 diabetes mellitus (HCC) 02/24/2019   Diverticulitis 05/20/2018   Diverticulitis of colon 03/18/2018   Family history of ischemic heart disease (IHD) 08/16/2013   Fibromyalgia    Fibromyalgia affecting multiple sites 01/14/2020   Gastro-esophageal reflux disease without esophagitis 01/13/2019   Glaucoma 11/08/2014   Hordeolum externum of left upper eyelid 03/13/2020   Hypertension 11/08/2014   IBS (irritable bowel syndrome)    Iron deficiency anemia 01/13/2019   Left renal mass 11/08/2019   Leukocytosis 05/28/2018   Low vitamin B12 level 03/13/2020   Low vitamin D level 12/12/2019   LV dysfunction 05/31/2019   Malignant essential hypertension 01/22/2016   Migraine, unspecified, not intractable, without status migrainosus 11/08/2014   Mixed hyperlipidemia  01/13/2019   Myocardial infarction (HCC)    Other premature beats 11/08/2014   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 02/22/2019   PVD (peripheral vascular disease) 04/08/2017   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) 02/22/2019   Small bowel obstruction (HCC)    Thyroid nodule    Trochanteric bursitis of right hip 04/20/2019   Type 2 diabetes mellitus  without complications (HCC) 12/08/2013   Vaginal atrophy 11/08/2014   Formatting of this note might be different from the original. Last Assessment & Plan:  For vaginal atrophy please place a pea size dab of Estrogen vaginal cream  into the vagina 3 times a week ( Monday, Wednesday, Friday) Last Assessment & Plan:  Formatting of this note might be different from the original. For vaginal atrophy please place a pea size dab of Estrogen vaginal cream  into the vagina     Past Surgical History:  Procedure Laterality Date   ABDOMINAL HYSTERECTOMY     BIV ICD INSERTION CRT-D N/A 12/31/2020   Procedure: BIV ICD INSERTION CRT-D;  Surgeon: Waddell Danelle ORN, MD;  Location: [facility] INVASIVE CV LAB;  Service: Cardiovascular;  Laterality: N/A;   BLEPHAROPLASTY Bilateral    COLON SURGERY     CORONARY ANGIOPLASTY WITH STENT PLACEMENT  2020   X3   CORONARY PRESSURE/FFR STUDY N/A 08/03/2020   Procedure: INTRAVASCULAR PRESSURE WIRE/FFR STUDY;  Surgeon: Wonda Sharper, MD;  Location: [facility] INVASIVE CV LAB;  Service: Cardiovascular;  Laterality: N/A;   CORONARY STENT INTERVENTION N/A 08/03/2020   Procedure: CORONARY STENT INTERVENTION;  Surgeon: Wonda Sharper, MD;  Location: [facility] INVASIVE CV LAB;  Service: Cardiovascular;  Laterality: N/A;   LEFT HEART CATH AND CORONARY ANGIOGRAPHY N/A 08/03/2020   Procedure: LEFT HEART CATH AND CORONARY ANGIOGRAPHY;  Surgeon: Wonda Sharper, MD;  Location: [facility] INVASIVE CV LAB;  Service: Cardiovascular;  Laterality: N/A;   RADIOFREQUENCY ABLATION  07/2023   REFRACTIVE SURGERY Left    [place]    RIGHT/LEFT HEART CATH AND CORONARY ANGIOGRAPHY N/A 10/13/2023   Procedure: RIGHT/LEFT HEART CATH AND CORONARY ANGIOGRAPHY;  Surgeon: Rolan Ezra RAMAN, MD;  Location: [facility] INVASIVE CV LAB;  Service: Cardiovascular;  Laterality: N/A;    Current Outpatient Medications  Medication Sig Dispense Refill   acetaminophen  (TYLENOL ) 650 MG CR tablet Take 650 mg by mouth every 8 (eight) hours as needed  for pain.     albuterol  (PROVENTIL ) (2.5 MG/3ML) 0.083% nebulizer solution Take 2.5 mg by nebulization every 4 (four) hours as needed for wheezing or shortness of breath.     aspirin  81 MG EC tablet Take 81 mg by mouth daily.     BD PEN NEEDLE NANO 2ND GEN 32G X 4 MM MISC 3 (three) times daily.     carvedilol  (COREG ) 3.125 MG tablet Take 1 tablet (3.125 mg total) by mouth 2 (two) times daily with a meal. 180 tablet 3   Continuous Glucose Sensor (DEXCOM G7 SENSOR) MISC SMARTSIG:1 Topical Every 10 Days     cyclobenzaprine (FLEXERIL) 5 MG tablet Take 5 mg by mouth 3 (three) times daily as needed.     dapagliflozin  propanediol (FARXIGA ) 10 MG TABS tablet TAKE 1 TABLET(10 MG) BY MOUTH DAILY BEFORE BREAKFAST 90 tablet 3   dicyclomine  (BENTYL ) 10 MG capsule TAKE 1 CAPSULE(10 MG) BY MOUTH IN THE MORNING AND AT BEDTIME 180 capsule 0   digoxin  (LANOXIN ) 0.125 MG tablet TAKE ONE-HALF TABLET BY MOUTH EVERY DAY 45 tablet 1   ezetimibe  (ZETIA ) 10 MG tablet Take 1 tablet (10  mg total) by mouth daily. 30 tablet 6   famotidine  (PEPCID ) 40 MG tablet TAKE 1 TABLET(40 MG) BY MOUTH TWICE DAILY 60 tablet 2   folic acid  (FOLVITE ) 1 MG tablet Take 1 mg by mouth daily.     insulin  lispro (HUMALOG) 100 UNIT/ML KwikPen Inject 2-20 Units into the skin 3 (three) times daily with meals.  per sliding scale     isosorbide  mononitrate (IMDUR ) 30 MG 24 hr tablet Take 15 mg by mouth daily.     lidocaine  (LIDODERM ) 5 % Place 1 patch onto the skin daily. Remove & Discard patch within 12 hours or as directed by MD 5 patch 0   loratadine (CLARITIN) 10 MG tablet Take 10 mg by mouth daily.     losartan  (COZAAR ) 25 MG tablet Take 0.5 tablets (12.5 mg total) by mouth daily. 45 tablet 3   nitroGLYCERIN  (NITROSTAT ) 0.4 MG SL tablet DISSOLVE 1 TABLET UNDER TONGUE EVERY 5 MINUTES AS NEEDED FOR CHEST PAIN. 25 tablet 3   OZEMPIC, 0.25 OR 0.5 MG/DOSE, 2 MG/3ML SOPN Inject 0.5 mg into the skin every Monday.     pantoprazole  (PROTONIX ) 40 MG  tablet Take 1 tablet (40 mg total) by mouth daily. TAKE 1 TABLET(40 MG) BY MOUTH DAILY 90 tablet 2   Plecanatide  (TRULANCE ) 3 MG TABS Take 1 tablet by mouth as needed.     ranolazine  (RANEXA ) 500 MG 12 hr tablet Take 1 tablet (500 mg total) by mouth 2 (two) times daily. 180 tablet 3   rosuvastatin  (CRESTOR ) 40 MG tablet Take 40 mg by mouth at bedtime.     spironolactone  (ALDACTONE ) 25 MG tablet Take 1 tablet (25 mg total) by mouth daily. 30 tablet 3   torsemide  (DEMADEX ) 20 MG tablet Take 1 tablet (20 mg total) by mouth daily. 40 mg in the morning and 20 mg in the evening, alternating with 20 mg Twice daily 180 tablet 3   TRESIBA FLEXTOUCH 200 UNIT/ML FlexTouch Pen Inject 35 Units into the skin 2 (two) times daily.     TRINTELLIX  5 MG TABS tablet Take 5 mg by mouth at bedtime.     Ubrogepant 50 MG TABS Take 50 mg by mouth daily as needed (Headache).     valACYclovir (VALTREX) 500 MG tablet Take 500 mg by mouth daily.     vitamin B-12 (CYANOCOBALAMIN ) 1000 MCG tablet Take 1,000 mcg by mouth daily.     Vitamin D, Ergocalciferol, (DRISDOL) 1.25 MG (50000 UNIT) CAPS capsule Take 50,000 Units by mouth every 7 (seven) days.     clopidogrel  (PLAVIX ) 75 MG tablet Take 1 tablet (75 mg total) by mouth daily. TAKE 1 TABLET(75 MG) BY MOUTH DAILY (Patient not taking: Reported on 11/09/2024) 90 tablet 2   No current facility-administered medications for this visit.    Allergies as of 11/09/2024 - Review Complete 11/09/2024  Allergen Reaction Noted   Bee venom Anaphylaxis 02/01/2018   Sumatriptan Shortness Of Breath 11/02/2020   Amoxicillin-pot clavulanate Diarrhea and Nausea And Vomiting 11/08/2014   Oxycodone Itching and Hives 11/08/2014   Buprenorphine hcl  04/19/2012   Clarithromycin Diarrhea 04/19/2012   Duloxetine  hcl Other (See Comments) 04/19/2012   Hydrocodone Itching 04/19/2012   Lactose  05/26/2018   Liraglutide Other (See Comments) 04/19/2012   Tramadol hcl Itching 04/19/2012    Family  History  Problem Relation Age of Onset   Lung cancer Mother    Coronary artery disease Mother    Diabetes Mother    Glaucoma Mother  Hypertension Mother    Colon polyps Mother    Migraines Father    Heart disease Father    Diabetes Maternal Grandmother    Diabetes Daughter    Migraines Son    Hypertension Brother     Social History   Tobacco Use   Smoking status: Former    Current packs/day: 0.00    Types: Cigarettes    Quit date: 2021    Years since quitting: 4.9   Smokeless tobacco: Never  Vaping Use   Vaping status: Never Used  Substance Use Topics   Alcohol use: Never   Drug use: Never     Review of Systems:    Constitutional: No unintentional weight loss, fever, chills Cardiovascular: No chest pain Respiratory: No SOB  Gastrointestinal: See HPI and otherwise negative   Physical Exam:  Vital signs: BP 118/68   Pulse 96   Ht 5' 5 (1.651 m)   Wt 168 lb (76.2 kg)   BMI 27.96 kg/m   Constitutional: Pleasant, well-appearing female in NAD, alert and cooperative Head:  Normocephalic and atraumatic.  Respiratory: Respirations even and unlabored. Lungs clear to auscultation bilaterally.  No wheezes, crackles, or rhonchi.  Cardiovascular:  Regular rate and rhythm. No murmurs. No peripheral edema. Gastrointestinal:  Soft, nondistended, nontender. No rebound or guarding. Normal bowel sounds. No appreciable masses or hepatomegaly. Rectal:  Not performed.  Neurologic:  Alert and oriented x4;  grossly normal neurologically.  Skin:   Dry and intact without significant lesions or rashes. Psychiatric: Oriented to person, place and time. Demonstrates good judgement and reason without abnormal affect or behaviors.   RELEVANT LABS AND IMAGING: CBC    Component Value Date/Time   WBC 10.3 05/26/2024 1433   WBC 8.2 01/11/2024 1428   RBC 4.27 05/26/2024 1433   HGB 12.2 05/26/2024 1433   HGB 12.8 06/16/2023 1441   HCT 38.7 05/26/2024 1433   HCT 39.3 06/16/2023 1441    PLT 292 05/26/2024 1433   MCV 90.6 05/26/2024 1433   MCV 89 06/16/2023 1441   MCV 86 02/14/2021 0000   MCH 28.6 05/26/2024 1433   MCHC 31.5 05/26/2024 1433   RDW 15.6 (H) 05/26/2024 1433   RDW 13.5 06/16/2023 1441   LYMPHSABS 3.0 05/26/2024 1433   LYMPHSABS 2.0 06/16/2023 1441   MONOABS 0.8 05/26/2024 1433   EOSABS 0.2 05/26/2024 1433   EOSABS 0.2 06/16/2023 1441   BASOSABS 0.1 05/26/2024 1433   BASOSABS 0.0 06/16/2023 1441    CMP     Component Value Date/Time   NA 138 10/10/2024 1609   K 4.7 10/10/2024 1609   CL 100 10/10/2024 1609   CO2 22 10/10/2024 1609   GLUCOSE 119 (H) 10/10/2024 1609   GLUCOSE 148 (H) 09/01/2024 1457   BUN 25 10/10/2024 1609   CREATININE 1.77 (H) 10/10/2024 1609   CREATININE 1.42 (H) 05/26/2024 1433   CALCIUM  9.6 10/10/2024 1609   PROT 7.0 05/26/2024 1433   PROT 7.4 02/26/2023 1234   ALBUMIN 4.0 05/26/2024 1433   ALBUMIN 4.1 02/26/2023 1234   AST 23 05/26/2024 1433   ALT 17 05/26/2024 1433   ALKPHOS 76 05/26/2024 1433   BILITOT 0.3 05/26/2024 1433   GFRNONAA 45 (L) 09/01/2024 1457   GFRNONAA 39 (L) 05/26/2024 1433   GFRAA 45 (L) 01/16/2021 1226   Echocardiogram 07/10/2023 1. Left ventricular ejection fraction, by estimation, is < 20% . The left ventricle has severely decreased function. The left ventricle has no regional wall motion abnormalities. The left  ventricular internal cavity size was moderately dilated. There is mild left ventricular hypertrophy. Left ventricular diastolic parameters are consistent with Grade I diastolic dysfunction (impaired relaxation) .  2. Right ventricular systolic function is normal. The right ventricular size is normal. There is normal pulmonary artery systolic pressure.  3. The mitral valve is normal in structure. Mild mitral valve regurgitation. No evidence of mitral stenosis.  4. The aortic valve is normal in structure. Aortic valve regurgitation is not visualized. No aortic stenosis is present.  5. The inferior  vena cava is normal in size with greater than 50% respiratory variability, suggesting right atrial pressure of 3 mmHg.  Echocardiogram 08/29/2024 LVEF this 20 to 25% global hypokinesia LV chamber size is moderately dilated Abnormal (paradoxical) motion consistent with left bundle branch block  Assessment/Plan:   Assessment & Plan Colorectal cancer screening in patient with history of colonic polyps and partial colectomy Due for repeat colonoscopy. Cardiologist clearance required due to significant cardiac history.  Will need to request to hold Plavix .  Procedures need to be done in hospital setting, possibly at [facility] based on significant cardiac history.  - Will discuss further with team regarding timing/location of colonoscopy. I thoroughly discussed the procedure with the patient to include nature of the procedure, alternatives, benefits, and risks (including but not limited to bleeding, infection, perforation, anesthesia/cardiac/pulmonary complications). Patient verbalized understanding and gave verbal consent to proceed with procedure.   Chronic constipation Improved with weight loss and dietary changes. Currently on Ozempic with weight loss and reduced meal sizes. No current rectal bleeding or abdominal pain. Takes Trulance  as needed.  - Continue current management with Ozempic and dietary modifications. - Continue Trulance  as needed for constipation.    Camie Furbish, PA-C [facility] 11/09/2024, 1:55 PM  Patient Care Team: Silver Lamar LABOR, MD as PCP - General (Family Medicine) Tobb, Kardie, DO as PCP - Cardiology (Cardiology) Waddell Danelle ORN, MD as PCP - Electrophysiology (Cardiology)

## 2024-11-09 NOTE — Patient Instructions (Signed)
 It has been recommended to you by your physician that you have a colonoscopy completed. We did not schedule the procedure today but we will call you with a date for [facility].  Thank you for trusting me with your gastrointestinal care!   Camie Furbish, PA-C  _______________________________________________________  If your blood pressure at your visit was 140/90 or greater, please contact your primary care physician to follow up on this.  _______________________________________________________  If you are age 28 or older, your body mass index should be between 23-30. Your Body mass index is 27.96 kg/m. If this is out of the aforementioned range listed, please consider follow up with your Primary Care Provider.  If you are age 13 or younger, your body mass index should be between 19-25. Your Body mass index is 27.96 kg/m. If this is out of the aformentioned range listed, please consider follow up with your Primary Care Provider.   ________________________________________________________  The [facility] providers would like to encourage you to use MYCHART to communicate with providers for non-urgent requests or questions.  Due to long hold times on the telephone, sending your provider a message by [facility] may be a faster and more efficient way to get a response.  Please allow 48 business hours for a response.  Please remember that this is for non-urgent requests.  _______________________________________________________  [facility] is using a team-based approach to care.  Your team is made up of your doctor and two to three APPS. Our APPS (Nurse Practitioners and Physician Assistants) work with your physician to ensure care continuity for you. They are fully qualified to address your health concerns and develop a treatment plan. They communicate directly with your gastroenterologist to care for you. Seeing the Advanced Practice Practitioners on your physician's team can help  you by facilitating care more promptly, often allowing for earlier appointments, access to diagnostic testing, procedures, and other specialty referrals.

## 2024-11-10 ENCOUNTER — Encounter: Payer: Self-pay | Admitting: Gastroenterology

## 2024-11-11 NOTE — Telephone Encounter (Signed)
° °  Patient Name: Karen Warner  DOB: 09-Sep-1951 MRN: [id number]  Primary Cardiologist: Kardie Tobb, DO  Chart reviewed as part of pre-operative protocol coverage. Given past medical history and time since last visit, based on ACC/AHA guidelines, Laverne Hursey is at acceptable risk for the planned procedure without further cardiovascular testing  Per Dr. Sheena 11/11/2024 Yes we can do a 7 day plavix  hold    The patient was advised that if she develops new symptoms prior to surgery to contact our office to arrange for a follow-up visit, and she verbalized understanding.  I will route this recommendation to the requesting party via Epic fax function and remove from pre-op pool.  Please call with questions.  Lamarr Satterfield, NP 11/11/2024, 1:45 PM

## 2024-11-14 NOTE — Telephone Encounter (Signed)
 Pt requesting a update about her clearance

## 2024-11-14 NOTE — Telephone Encounter (Signed)
 S/w thept that DR. Tobb 11/11/24 cleared her and gave the ok to hold Plavix  x 7 days, which pt states she has been holding already. I assured the pt that I would be happy to re-fax the notes again to Dr. Bill office. Pt said thank you so much for the help and verbalized understanding that she has been cleared.

## 2024-11-15 ENCOUNTER — Other Ambulatory Visit: Payer: Self-pay | Admitting: Gastroenterology

## 2024-11-18 ENCOUNTER — Ambulatory Visit: Attending: Cardiology

## 2024-11-18 DIAGNOSIS — R002 Palpitations: Secondary | ICD-10-CM | POA: Insufficient documentation

## 2024-11-18 DIAGNOSIS — R0609 Other forms of dyspnea: Secondary | ICD-10-CM | POA: Insufficient documentation

## 2024-11-18 LAB — ECHOCARDIOGRAM COMPLETE
Area-P 1/2: 5.34 cm2
Est EF: <20
S' Lateral: 6.1 cm

## 2024-11-25 ENCOUNTER — Ambulatory Visit: Admitting: Oncology

## 2024-11-25 ENCOUNTER — Other Ambulatory Visit

## 2024-12-05 NOTE — Progress Notes (Signed)
 " [employer] at [facility] [address] [place],  [place]  [address] [phone]   Clinic Day:  12/06/2024  Referring physician: Silver Lamar LABOR, MD  HISTORY OF PRESENT ILLNESS:  The patient is a 74 y.o. 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  She comes in today to reassess her iron and hemoglobin levels.  Of note, she last received IV iron in August 2024.  Since her last visit, the patient claims to be doing fairly well.  She denies having increased fatigue or any overt forms of blood loss which concern her for progressive anemia.  Her major health issue currently is her suboptimal cardiac function.    PHYSICAL EXAM:  Blood pressure 122/65, pulse 96, temperature 97.8 F (36.6 C), temperature source Oral, resp. rate 14, height 5' 5 (1.651 m), weight 167 lb 12.8 oz (76.1 kg), SpO2 98%. Wt Readings from Last 3 Encounters:  12/20/24 163 lb 3.2 oz (74 kg)  12/13/24 168 lb 3.2 oz (76.3 kg)  12/06/24 167 lb 12.8 oz (76.1 kg)   Body mass index is 27.92 kg/m. Performance status (ECOG): 1 - Symptomatic but completely ambulatory Physical Exam Constitutional:      General: She is not in acute distress.    Appearance: Normal appearance. She is normal weight.  HENT:     Head: Normocephalic and atraumatic.  Eyes:     General: No scleral icterus.    Extraocular Movements: Extraocular movements intact.     Conjunctiva/sclera: Conjunctivae normal.     Pupils: Pupils are equal, round, and reactive to light.  Cardiovascular:     Rate and Rhythm: Normal rate and regular rhythm.     Pulses: Normal pulses.     Heart sounds: Normal heart sounds. No murmur heard.    No friction rub. No gallop.  Pulmonary:     Effort: Pulmonary effort is normal. No respiratory distress.     Breath sounds: Normal breath sounds.  Abdominal:     General: Bowel sounds are normal. There is no distension.     Palpations: Abdomen is soft. There is no  hepatomegaly, splenomegaly or mass.     Tenderness: There is no abdominal tenderness.  Musculoskeletal:        General: Normal range of motion.     Cervical back: Normal range of motion and neck supple.     Right lower leg: No edema.     Left lower leg: No edema.  Lymphadenopathy:     Cervical: No cervical adenopathy.  Skin:    General: Skin is warm and dry.  Neurological:     General: No focal deficit present.     Mental Status: She is alert and oriented to person, place, and time. Mental status is at baseline.  Psychiatric:        Mood and Affect: Mood normal.        Behavior: Behavior normal.        Thought Content: Thought content normal.        Judgment: Judgment normal.    LABS:    Latest Reference Range & Units 12/06/24 15:56  WBC 4.0 - 10.5 K/uL 10.2  RBC 3.87 - 5.11 MIL/uL 4.02  Hemoglobin 12.0 - 15.0 g/dL 87.4  HCT 63.9 - 53.9 % 38.8  MCV 80.0 - 100.0 fL 96.5  MCH 26.0 - 34.0 pg 31.1  MCHC 30.0 - 36.0 g/dL 67.7  RDW 88.4 - 84.4 % 13.8  Platelets 150 -  400 K/uL 307  nRBC 0.0 - 0.2 % 0.0  Neutrophils % 63  Lymphocytes % 27  Monocytes Relative % 8  Eosinophil % 2  Basophil % 0  Immature Granulocytes % 0    Latest Reference Range & Units 12/06/24 15:56  Sodium 135 - 145 mmol/L 142  Potassium 3.5 - 5.1 mmol/L 4.1  Chloride 98 - 111 mmol/L 105  CO2 22 - 32 mmol/L 26  Glucose 70 - 99 mg/dL 826 (H)  BUN 8 - 23 mg/dL 13  Creatinine 9.55 - 8.99 mg/dL 8.48 (H)  Calcium  8.9 - 10.3 mg/dL 89.4 (H)  Anion gap 5 - 15  10  Alkaline Phosphatase 38 - 126 U/L 64  Albumin 3.5 - 5.0 g/dL 4.3  AST 15 - 41 U/L 24  ALT 0 - 44 U/L 12  Total Protein 6.5 - 8.1 g/dL 7.2  Total Bilirubin 0.0 - 1.2 mg/dL 0.4  GFR, Est Non African American >60 mL/min 36 (L)  (H): Data is abnormally high (L): Data is abnormally low   Latest Reference Range & Units 12/06/24 15:56  Iron 28 - 170 ug/dL 84  UIBC ug/dL 725  TIBC 749 - 549 ug/dL 641  Saturation Ratios 10.4 - 31.8 % 24  Ferritin  11 - 307 ng/mL 39   ASSESSMENT & PLAN:  Assessment/Plan:  A 74 y.o. female with anemia secondary to iron deficiency and previous renal insufficiency.  I am pleased as her hemoglobin has held stable over these past months without any particular intervention.  Her current iron parameters also remain fine.  Clinically, the patient appears to be doing well.  I will see her back in 6 months for repeat clinical assessment.  The patient understands all the plans discussed today and is in agreement with them.  Karen Warner Karen Kerns, MD      "

## 2024-12-06 ENCOUNTER — Inpatient Hospital Stay: Admitting: Oncology

## 2024-12-06 ENCOUNTER — Inpatient Hospital Stay: Attending: Oncology

## 2024-12-06 VITALS — BP 122/65 | HR 96 | Temp 97.8°F | Resp 14 | Ht 65.0 in | Wt 167.8 lb

## 2024-12-06 DIAGNOSIS — D508 Other iron deficiency anemias: Secondary | ICD-10-CM

## 2024-12-06 LAB — IRON AND TIBC
Iron: 84 ug/dL (ref 28–170)
Saturation Ratios: 24 % (ref 10.4–31.8)
TIBC: 358 ug/dL (ref 250–450)
UIBC: 274 ug/dL

## 2024-12-06 LAB — CMP (CANCER CENTER ONLY)
ALT: 12 U/L (ref 0–44)
AST: 24 U/L (ref 15–41)
Albumin: 4.3 g/dL (ref 3.5–5.0)
Alkaline Phosphatase: 64 U/L (ref 38–126)
Anion gap: 10 (ref 5–15)
BUN: 13 mg/dL (ref 8–23)
CO2: 26 mmol/L (ref 22–32)
Calcium: 10.5 mg/dL — ABNORMAL HIGH (ref 8.9–10.3)
Chloride: 105 mmol/L (ref 98–111)
Creatinine: 1.51 mg/dL — ABNORMAL HIGH (ref 0.44–1.00)
GFR, Estimated: 36 mL/min — ABNORMAL LOW
Glucose, Bld: 173 mg/dL — ABNORMAL HIGH (ref 70–99)
Potassium: 4.1 mmol/L (ref 3.5–5.1)
Sodium: 142 mmol/L (ref 135–145)
Total Bilirubin: 0.4 mg/dL (ref 0.0–1.2)
Total Protein: 7.2 g/dL (ref 6.5–8.1)

## 2024-12-06 LAB — CBC WITH DIFFERENTIAL (CANCER CENTER ONLY)
Abs Immature Granulocytes: 0.02 K/uL (ref 0.00–0.07)
Basophils Absolute: 0 K/uL (ref 0.0–0.1)
Basophils Relative: 0 %
Eosinophils Absolute: 0.2 K/uL (ref 0.0–0.5)
Eosinophils Relative: 2 %
HCT: 38.8 % (ref 36.0–46.0)
Hemoglobin: 12.5 g/dL (ref 12.0–15.0)
Immature Granulocytes: 0 %
Lymphocytes Relative: 27 %
Lymphs Abs: 2.7 K/uL (ref 0.7–4.0)
MCH: 31.1 pg (ref 26.0–34.0)
MCHC: 32.2 g/dL (ref 30.0–36.0)
MCV: 96.5 fL (ref 80.0–100.0)
Monocytes Absolute: 0.8 K/uL (ref 0.1–1.0)
Monocytes Relative: 8 %
Neutro Abs: 6.4 K/uL (ref 1.7–7.7)
Neutrophils Relative %: 63 %
Platelet Count: 307 K/uL (ref 150–400)
RBC: 4.02 MIL/uL (ref 3.87–5.11)
RDW: 13.8 % (ref 11.5–15.5)
WBC Count: 10.2 K/uL (ref 4.0–10.5)
nRBC: 0 % (ref 0.0–0.2)

## 2024-12-06 LAB — FERRITIN: Ferritin: 39 ng/mL (ref 11–307)

## 2024-12-07 ENCOUNTER — Telehealth: Payer: Self-pay | Admitting: Oncology

## 2024-12-07 ENCOUNTER — Telehealth (HOSPITAL_BASED_OUTPATIENT_CLINIC_OR_DEPARTMENT_OTHER): Payer: Self-pay

## 2024-12-07 NOTE — Telephone Encounter (Signed)
"  ° °  Pre-operative Risk Assessment    Patient Name: Karen Warner  DOB: 08/15/1951 MRN: [id number]   Date of last office visit: 10/20/24 with Lesia  Date of next office visit: 12/20/24 with [facility]  Request for Surgical Clearance    Procedure:  Cervical 3,4,5 Medial Branch Block  Date of Surgery:  Clearance 12/14/24                                  Surgeon:  Dr. Darlean Surgeon's Group or Practice Name:  [facility]  Phone number:  [phone] Fax number:  [phone]   Type of Clearance Requested:   - Medical  - Pharmacy:  Hold Clopidogrel  (Plavix ) 5-7 days prior and aspirin  was not indicated    Type of Anesthesia:  Not Indicated   Additional requests/questions:    [name] [name]   12/07/2024, 4:11 PM   "

## 2024-12-07 NOTE — Telephone Encounter (Signed)
 Call received from Pt.  Pt states she received a mychart message advising her to not send manual transmissions.  She states she did not send a manual transmission.  Per Pt she was on vacation and had returned and plugged her monitor back in.  Advised it was ok and that it was not a problem that her device sent a transmission.    Apologized that she felt bad about the extra transmission.  Reassured her that it was ok and she should not worry about it at all.  Pt laughed and thanked engineer, civil (consulting).

## 2024-12-07 NOTE — Telephone Encounter (Signed)
 Patient has been scheduled for follow-up visit per 12/06/2024 LOS.  Pt aware of scheduled appt details.

## 2024-12-08 NOTE — Telephone Encounter (Signed)
"  ° °  Patient Name: Karen Warner  DOB: 02/25/1951 MRN: [id number]  Primary Cardiologist: Kardie Tobb, DO  Chart reviewed as part of pre-operative protocol coverage. Given past medical history and time since last visit, based on ACC/AHA guidelines, [name] is at acceptable risk for the planned procedure without further cardiovascular testing.  Patient was seen on 10/19/2024 by Dr.Tobb and was stable from a cardiac standpoint.  Per Dr. Sheena, [facility] to hold th plavix  for 7 days.  Please resume Plavix  as soon as possible postprocedure, at the discretion of the surgeon.   I will route this recommendation to the requesting party via Epic fax function and remove from pre-op pool.  Please call with questions.  Damien JAYSON Braver, NP 12/08/2024, 11:51 AM  "

## 2024-12-08 NOTE — Telephone Encounter (Signed)
 Caller Leone) is following-up on patient's clearance status to hold her Eliquis.

## 2024-12-09 NOTE — Telephone Encounter (Signed)
 I called the requesting office though could not get through to surgery scheduler. I will re-fax the notes the pt was cleared on 12/08/24 with recommendations for Plavix .

## 2024-12-12 ENCOUNTER — Ambulatory Visit (INDEPENDENT_AMBULATORY_CARE_PROVIDER_SITE_OTHER): Admitting: Podiatry

## 2024-12-12 ENCOUNTER — Telehealth (HOSPITAL_COMMUNITY): Payer: Self-pay

## 2024-12-12 ENCOUNTER — Telehealth: Payer: Self-pay | Admitting: Gastroenterology

## 2024-12-12 DIAGNOSIS — M79675 Pain in left toe(s): Secondary | ICD-10-CM | POA: Diagnosis not present

## 2024-12-12 DIAGNOSIS — E1151 Type 2 diabetes mellitus with diabetic peripheral angiopathy without gangrene: Secondary | ICD-10-CM | POA: Diagnosis not present

## 2024-12-12 DIAGNOSIS — M79674 Pain in right toe(s): Secondary | ICD-10-CM

## 2024-12-12 DIAGNOSIS — B351 Tinea unguium: Secondary | ICD-10-CM | POA: Diagnosis not present

## 2024-12-12 NOTE — Progress Notes (Unsigned)
 Nails x 10.  Right pinch callus.

## 2024-12-12 NOTE — Telephone Encounter (Signed)
 Called to confirm/remind patient of their appointment at [facility] on 12/13/24.   Appointment:   [x] Confirmed  [] Left mess   [] No answer/No voice mail  [] VM Full/unable to leave message  [] Phone not in service  Patient reminded to bring all medications and/or complete list.  Confirmed patient has transportation. Gave directions, instructed to utilize valet parking.

## 2024-12-12 NOTE — Progress Notes (Incomplete)
 "  [facility] NOTE PCP: Silver Lamar LABOR, MD Cardiology: Dr. Sheena HF Cardiology: Dr. Rolan   HPI: Karen Warner is 74 y.o. with history of CAD and ischemic cardiomyopathy and COPD. Patient had PCI to mid/distal LAD in 2020, then had unstable angina in 9/21 with DES to 70% stenosis proximal LAD.  She has had a cardiomyopathy for a number of years, [employer] CRT-D device placed in 2022.  Last echo in 8/24 showed EF < 20%, moderate LV dilation, mild LVH, RV normal.  She additionally has a diagnosis of COPD, quit smoking in 2021. She lives in [place], originally from [place]  but moved here to be near family.   LHC/RHC in 11/24 showed patent LAD stents with distal edge dissection in the apical LAD (no change from prior films), normal filling pressures, CI low at 2.12.   S/p barostimulation activation therapy device implant 2/25.  She returns today for heart failure follow up. Overall feeling well from a HF standpoint. Has lost ~20-25 lbs on Ozmepic, congratulated. Since then she has been able to ambulate further and shortness of breath has been improving. Reports that she is having stim from barostim, described as hearing buzzing. Has seen EP multiple times for turn downs. NYHA II-III, limited mostly by pain. Biggest compliant is pinched nerve in the L shoulder, she has not had a lot of relief despite interventions. Denies chest pain, orthopnea, palpitations, and dizziness. Able to perform ADLs. Appetite okay. Compliant with all medications.   Labs (7/24): K 4, creatinine 1.45 Labs (10/24): K 4.1, creatinine 1.6, LDL 74 Labs (12/24): LDL 41 Labs (2/25): K 3.5, creatinine 1.30 Labs (5/25): K 3.9, creatinine 1.24, LDL 48 Labs (6/25): K 3.2, creatinine 1.42 Labs (7/25): K 4.1, creatinine 1.63  PMH: 1. Fe deficiency anemia 2. OSA: Does not tolerate CPAP.  3. HTN 4. IBS 5. COPD: Quit smoking in 2021.  6. Depression  7. Type 2 diabetes 8. CAD: PCI mid-distal LAD  in 2020.  - LHC (9/21): Unstable angina, 70% proximal LAD treated with DES.  - LHC (11/24): Patent LAD stents with distal edge dissection in the apical LAD (no change from prior films) 9. Chronic systolic CHF: Suspect ischemic cardiomyopathy.  [employer] CRT-D device.  - Echo (8/22): EF 25-30% - Echo (3/23): EF 30% - Echo (8/24): EF < 20%, moderate LV dilation, mild LVH, RV normal - RHC (11/24): mean RA 3, PA 35/9, mean PCWP 9, CI 2.12, PAPi 8.7 - s/p  barostim 2/25 10. CKD stage 3 11. PAD: ABIs moderately decreased on right, mildly decreased on left.   FH: No sudden death or cardiomyopathy  Social History   Socioeconomic History   Marital status: Significant Other    Spouse name: Not on file   Number of children: 3   Years of education: Not on file   Highest education level: Not on file  Occupational History   Occupation: retired  Tobacco Use   Smoking status: Former    Current packs/day: 0.00    Types: Cigarettes    Quit date: 2021    Years since quitting: 5.0   Smokeless tobacco: Never  Vaping Use   Vaping status: Never Used  Substance and Sexual Activity   Alcohol use: Never   Drug use: Never   Sexual activity: Not on file  Other Topics Concern   Not on file  Social History Narrative   Not on file   Social Drivers of Health   Tobacco Use:  Medium Risk (12/07/2024)   Received from [facility]   Patient History    Smoking Tobacco Use: Former    Smokeless Tobacco Use: Never    Passive Exposure: Never  Physicist, Medical Strain: Low Risk (12/07/2024)   Received from [facility]   Overall Financial Resource Strain (CARDIA)    How hard is it for you to pay for the very basics like food, housing, medical care, and heating?: Not hard at all  Food Insecurity: No Food Insecurity (12/07/2024)   Received from [facility]   Epic    Within the past 12 months, you worried that your food would run out before you got the money to buy more.: Never true    Within the  past 12 months, the food you bought just didn't last and you didn't have money to get more.: Never true  Transportation Needs: No Transportation Needs (12/07/2024)   Received from [facility]    In the past 12 months, has lack of transportation kept you from medical appointments or from getting medications?: No    In the past 12 months, has lack of transportation kept you from meetings, work, or from getting things needed for daily living?: No  Physical Activity: Not on file  Stress: Not on file  Social Connections: Not on file  Intimate Partner Violence: Not on file  Depression (PHQ2-9): Low Risk (05/26/2024)   Depression (PHQ2-9)    PHQ-2 Score: 0  Alcohol Screen: Not on file  Housing: Low Risk (12/07/2024)   Received from [facility]    In the last 12 months, was there a time when you were not able to pay the mortgage or rent on time?: No    In the past 12 months, how many times have you moved where you were living?: 0    At any time in the past 12 months, were you homeless or living in a shelter (including now)?: No  Utilities: Not At Risk (12/07/2024)   Received from [facility]    In the past 12 months has the electric, gas, oil, or water company threatened to shut off services in your home?: No  Health Literacy: Not on file   ROS: All systems reviewed and negative except as per HPI.   Current Outpatient Medications  Medication Sig Dispense Refill   acetaminophen  (TYLENOL ) 650 MG CR tablet Take 650 mg by mouth every 8 (eight) hours as needed for pain.     albuterol  (PROVENTIL ) (2.5 MG/3ML) 0.083% nebulizer solution Take 2.5 mg by nebulization every 4 (four) hours as needed for wheezing or shortness of breath.     aspirin  81 MG EC tablet Take 81 mg by mouth daily.     BD PEN NEEDLE NANO 2ND GEN 32G X 4 MM MISC 3 (three) times daily.     carvedilol  (COREG ) 3.125 MG tablet Take 1 tablet (3.125 mg total) by mouth 2 (two) times daily with a meal. 180 tablet 3    clopidogrel  (PLAVIX ) 75 MG tablet Take 1 tablet (75 mg total) by mouth daily. TAKE 1 TABLET(75 MG) BY MOUTH DAILY (Patient not taking: Reported on 11/09/2024) 90 tablet 2   Continuous Glucose Sensor (DEXCOM G7 SENSOR) MISC SMARTSIG:1 Topical Every 10 Days     cyclobenzaprine  (FLEXERIL ) 5 MG tablet Take 5 mg by mouth 3 (three) times daily as needed.     dapagliflozin  propanediol (FARXIGA ) 10 MG TABS tablet TAKE 1 TABLET(10 MG) BY MOUTH  DAILY BEFORE BREAKFAST 90 tablet 3   dicyclomine  (BENTYL ) 10 MG capsule TAKE 1 CAPSULE(10 MG) BY MOUTH IN THE MORNING AND AT BEDTIME 180 capsule 0   digoxin  (LANOXIN ) 0.125 MG tablet TAKE ONE-HALF TABLET BY MOUTH EVERY DAY 45 tablet 1   ezetimibe  (ZETIA ) 10 MG tablet Take 1 tablet (10 mg total) by mouth daily. 30 tablet 6   famotidine  (PEPCID ) 40 MG tablet TAKE 1 TABLET(40 MG) BY MOUTH TWICE DAILY 60 tablet 2   folic acid  (FOLVITE ) 1 MG tablet Take 1 mg by mouth daily.     insulin  lispro (HUMALOG) 100 UNIT/ML KwikPen Inject 2-20 Units into the skin 3 (three) times daily with meals.  per sliding scale     isosorbide  mononitrate (IMDUR ) 30 MG 24 hr tablet Take 15 mg by mouth daily.     lidocaine  (LIDODERM ) 5 % Place 1 patch onto the skin daily. Remove & Discard patch within 12 hours or as directed by MD 5 patch 0   loratadine  (CLARITIN ) 10 MG tablet Take 10 mg by mouth daily.     losartan  (COZAAR ) 25 MG tablet Take 0.5 tablets (12.5 mg total) by mouth daily. 45 tablet 3   nitroGLYCERIN  (NITROSTAT ) 0.4 MG SL tablet DISSOLVE 1 TABLET UNDER TONGUE EVERY 5 MINUTES AS NEEDED FOR CHEST PAIN. 25 tablet 3   OZEMPIC, 0.25 OR 0.5 MG/DOSE, 2 MG/3ML SOPN Inject 0.5 mg into the skin every Monday.     pantoprazole  (PROTONIX ) 40 MG tablet Take 1 tablet (40 mg total) by mouth daily. TAKE 1 TABLET(40 MG) BY MOUTH DAILY 90 tablet 2   Plecanatide  (TRULANCE ) 3 MG TABS Take 1 tablet (3 mg total) by mouth as needed. 30 tablet 1   ranolazine  (RANEXA ) 500 MG 12 hr tablet Take 1 tablet (500 mg  total) by mouth 2 (two) times daily. 180 tablet 3   rosuvastatin  (CRESTOR ) 40 MG tablet Take 40 mg by mouth at bedtime.     spironolactone  (ALDACTONE ) 25 MG tablet Take 1 tablet (25 mg total) by mouth daily. 30 tablet 3   torsemide  (DEMADEX ) 20 MG tablet Take 1 tablet (20 mg total) by mouth daily. 40 mg in the morning and 20 mg in the evening, alternating with 20 mg Twice daily 180 tablet 3   TRESIBA FLEXTOUCH 200 UNIT/ML FlexTouch Pen Inject 35 Units into the skin 2 (two) times daily.     TRINTELLIX  5 MG TABS tablet Take 5 mg by mouth at bedtime.     Ubrogepant  50 MG TABS Take 50 mg by mouth daily as needed (Headache).     valACYclovir  (VALTREX ) 500 MG tablet Take 500 mg by mouth daily.     vitamin B-12 (CYANOCOBALAMIN ) 1000 MCG tablet Take 1,000 mcg by mouth daily.     Vitamin D , Ergocalciferol , (DRISDOL ) 1.25 MG (50000 UNIT) CAPS capsule Take 50,000 Units by mouth every 7 (seven) days.     No current facility-administered medications for this visit.   Wt Readings from Last 3 Encounters:  12/06/24 76.1 kg (167 lb 12.8 oz)  11/09/24 76.2 kg (168 lb)  10/19/24 77 kg (169 lb 12.8 oz)   There were no vitals taken for this visit.  Physical Exam: General: Well appearing. No distress on RA Cardiac: JVP flat. S1 and S2 present. No murmurs Resp: Lung sounds clear and equal B/L Extremities: Warm and dry.  No edema.  Neuro: Alert and oriented x3. Affect pleasant.   Geographical Information Systems Officer (personally reviewed): HeartLogic Score activity lvl 0.3  h/d, 100% BiV pacing, 0 AF/AT  Assessment/Plan: 1. Chronic systolic CHF: Ischemic cardiomyopathy.  [employer] CRT-D device.  Echo in 8/24 showed EF < 20%, moderate LV dilation, mild LVH, RV normal.  This is somewhat worse than priors. LHC/RHC in 11/24 showed low CI at 2.12, normal filling pressures, unchanged coronary anatomy with no severe stenoses. S/p barostimulation activation therapy device 2/25. Echo from [facility]  showed EF 20-25%, paradoxical LV motion. - NYHA class II-III.  Though weight is down, she is mildly volume overloaded on exam with HeartLogic score 20. Low BP has limited GDMT titration.  - Continue spironolactone  to 25 mg daily.   - Decrease torsemide  to 20 mg daily - Continue losartan  12.5 mg daily. BP elevated in clinic today, however SBP at home in 100-110s. - Continue Coreg  3.125 mg bid.  - She stopped Farxiga  due to multiple yeast infections.  - Continue digoxin  0.0625 mg daily.  - Labs reviewed from 10/10/24. Cr 1.77 2.  CAD: PCI to mid/distal LAD in 2020 and proximal LAD in 2021.  LHC in 11/24 showed stable coronary anatomy with no severe stenoses. No chest pain. - Continue ranolazine  500 mg bid. - Continue ASA/Plavix .  - Continue Crestor  + Zetia , goal LDL < 55. Check lipids.  3. COPD: History of smoking, quit 2021.   4. CKD 3: Follows with nephology at [facility]. Avoid hypotension.  5. Obesity: There is no height or weight on file to calculate BMI. - Continue semaglutide. - has lost ~20-25 lbs, congratulated 6. PAD: Moderately decreased ABI on right in 7/25.  No claudication.  - She saw Dr. Serene, plan medical management.   Follow up in 2 months with APP  Harlene CHRISTELLA Gainer, FNP 12/12/2024  "

## 2024-12-12 NOTE — Telephone Encounter (Signed)
 I called patient to discuss recommendations from Dr. Shila and Dr. Avram regarding colonoscopy for her history of polyps.  Given her significant medical comorbidities, and particularly her cardiac function, recommendation was to defer surveillance colonoscopy at this time.  The patient was understanding and agrees to hold off on colonoscopy.  Should she desire to discuss this again in future, I would recommend scheduling an appointment with Dr. Shila.

## 2024-12-13 ENCOUNTER — Ambulatory Visit (HOSPITAL_COMMUNITY)

## 2024-12-13 ENCOUNTER — Encounter (HOSPITAL_COMMUNITY): Payer: Self-pay

## 2024-12-13 ENCOUNTER — Ambulatory Visit (HOSPITAL_COMMUNITY): Payer: Self-pay | Admitting: Family Medicine

## 2024-12-13 ENCOUNTER — Ambulatory Visit (HOSPITAL_COMMUNITY)
Admission: RE | Admit: 2024-12-13 | Discharge: 2024-12-13 | Disposition: A | Source: Ambulatory Visit | Attending: Family Medicine

## 2024-12-13 VITALS — BP 112/66 | HR 96 | Wt 168.2 lb

## 2024-12-13 DIAGNOSIS — Z7902 Long term (current) use of antithrombotics/antiplatelets: Secondary | ICD-10-CM | POA: Insufficient documentation

## 2024-12-13 DIAGNOSIS — I739 Peripheral vascular disease, unspecified: Secondary | ICD-10-CM | POA: Diagnosis not present

## 2024-12-13 DIAGNOSIS — E669 Obesity, unspecified: Secondary | ICD-10-CM | POA: Insufficient documentation

## 2024-12-13 DIAGNOSIS — E1122 Type 2 diabetes mellitus with diabetic chronic kidney disease: Secondary | ICD-10-CM | POA: Insufficient documentation

## 2024-12-13 DIAGNOSIS — N183 Chronic kidney disease, stage 3 unspecified: Secondary | ICD-10-CM | POA: Insufficient documentation

## 2024-12-13 DIAGNOSIS — I2510 Atherosclerotic heart disease of native coronary artery without angina pectoris: Secondary | ICD-10-CM | POA: Insufficient documentation

## 2024-12-13 DIAGNOSIS — Z7982 Long term (current) use of aspirin: Secondary | ICD-10-CM | POA: Insufficient documentation

## 2024-12-13 DIAGNOSIS — Z7985 Long-term (current) use of injectable non-insulin antidiabetic drugs: Secondary | ICD-10-CM | POA: Insufficient documentation

## 2024-12-13 DIAGNOSIS — R002 Palpitations: Secondary | ICD-10-CM | POA: Insufficient documentation

## 2024-12-13 DIAGNOSIS — Z794 Long term (current) use of insulin: Secondary | ICD-10-CM | POA: Insufficient documentation

## 2024-12-13 DIAGNOSIS — I130 Hypertensive heart and chronic kidney disease with heart failure and stage 1 through stage 4 chronic kidney disease, or unspecified chronic kidney disease: Secondary | ICD-10-CM | POA: Insufficient documentation

## 2024-12-13 DIAGNOSIS — Z6827 Body mass index (BMI) 27.0-27.9, adult: Secondary | ICD-10-CM | POA: Insufficient documentation

## 2024-12-13 DIAGNOSIS — J449 Chronic obstructive pulmonary disease, unspecified: Secondary | ICD-10-CM | POA: Diagnosis not present

## 2024-12-13 DIAGNOSIS — Z955 Presence of coronary angioplasty implant and graft: Secondary | ICD-10-CM | POA: Insufficient documentation

## 2024-12-13 DIAGNOSIS — Z7984 Long term (current) use of oral hypoglycemic drugs: Secondary | ICD-10-CM | POA: Insufficient documentation

## 2024-12-13 DIAGNOSIS — Z79899 Other long term (current) drug therapy: Secondary | ICD-10-CM | POA: Insufficient documentation

## 2024-12-13 DIAGNOSIS — I5022 Chronic systolic (congestive) heart failure: Secondary | ICD-10-CM | POA: Diagnosis not present

## 2024-12-13 DIAGNOSIS — I255 Ischemic cardiomyopathy: Secondary | ICD-10-CM | POA: Insufficient documentation

## 2024-12-13 DIAGNOSIS — Z87891 Personal history of nicotine dependence: Secondary | ICD-10-CM | POA: Insufficient documentation

## 2024-12-13 DIAGNOSIS — K219 Gastro-esophageal reflux disease without esophagitis: Secondary | ICD-10-CM | POA: Insufficient documentation

## 2024-12-13 LAB — BASIC METABOLIC PANEL WITH GFR
Anion gap: 10 (ref 5–15)
BUN: 19 mg/dL (ref 8–23)
CO2: 26 mmol/L (ref 22–32)
Calcium: 10 mg/dL (ref 8.9–10.3)
Chloride: 97 mmol/L — ABNORMAL LOW (ref 98–111)
Creatinine, Ser: 1.51 mg/dL — ABNORMAL HIGH (ref 0.44–1.00)
GFR, Estimated: 36 mL/min — ABNORMAL LOW
Glucose, Bld: 247 mg/dL — ABNORMAL HIGH (ref 70–99)
Potassium: 4.8 mmol/L (ref 3.5–5.1)
Sodium: 133 mmol/L — ABNORMAL LOW (ref 135–145)

## 2024-12-13 LAB — PRO BRAIN NATRIURETIC PEPTIDE: Pro Brain Natriuretic Peptide: 1342 pg/mL — ABNORMAL HIGH

## 2024-12-13 MED ORDER — POTASSIUM CHLORIDE CRYS ER 20 MEQ PO TBCR: 20.0000 meq | EXTENDED_RELEASE_TABLET | Freq: Every day | ORAL | 3 refills | Status: AC

## 2024-12-13 MED ORDER — TORSEMIDE 20 MG PO TABS: 20.0000 mg | ORAL_TABLET | Freq: Two times a day (BID) | ORAL | 3 refills | Status: AC

## 2024-12-13 NOTE — Progress Notes (Addendum)
 "  ADVANCED HF CLINIC NOTE PCP: Silver Lamar LABOR, MD Cardiology: Dr. Sheena HF Cardiology: Dr. Rolan   HPI: Karen Warner is 74 y.o. with history of CAD and ischemic cardiomyopathy and COPD. Patient had PCI to mid/distal LAD in 2020, then had unstable angina in 9/21 with DES to 70% stenosis proximal LAD.  She has had a cardiomyopathy for a number of years, [employer] CRT-D device placed in 2022.  Last echo in 8/24 showed EF < 20%, moderate LV dilation, mild LVH, RV normal.  She additionally has a diagnosis of COPD, quit smoking in 2021. She lives in [place], originally from [place]  but moved here to be near family.   LHC/RHC in 11/24 showed patent LAD stents with distal edge dissection in the apical LAD (no change from prior films), normal filling pressures, CI low at 2.12.   S/p barostimulation activation therapy device implant 2/25.  Echo 12/25 showed EF <20%, RV normal.   Today she returns for HF follow up with her significant other. Overall feeling fair. She has SOB walking further distances on flat ground. Her energy has improved, but she has had CP for past 3 days, non-exertional. She attributes it to GERD and has taken tums, without relief. Denies palpitations, abnormal bleeding, dizziness, edema, or PND/Orthopnea. Appetite ok. Weight at home 160 pounds. Taking all medications.   [employer] device interrogation (personally reviewed from 12/08/24): HL score 19, thoracic impedence down, 0.7 hr/day activity, no VT, average HR 86 bpm, 99% BiV  ECG (personally reviewed): barostim artifact, but appears A sensed, V paced, LBBB, no acute ST-T changes  Labs (2/25): K 3.5, creatinine 1.30 Labs (5/25): K 3.9, creatinine 1.24, LDL 48 Labs (6/25): K 3.2, creatinine 1.42 Labs (7/25): K 4.1, creatinine 1.63 Labs (10/25): LDL 44 Labs (1/26): K 4.1, creatinine 1.51  PMH: 1. Fe deficiency anemia 2. OSA: Does not tolerate CPAP.  3. HTN 4. IBS 5. COPD: Quit smoking in 2021.  6.  Depression  7. Type 2 diabetes 8. CAD: PCI mid-distal LAD in 2020.  - LHC (9/21): Unstable angina, 70% proximal LAD treated with DES.  - LHC (11/24): Patent LAD stents with distal edge dissection in the apical LAD (no change from prior films) 9. Chronic systolic CHF: Suspect ischemic cardiomyopathy.  [employer] CRT-D device.  - Echo (8/22): EF 25-30% - Echo (3/23): EF 30% - Echo (8/24): EF < 20%, moderate LV dilation, mild LVH, RV normal - RHC (11/24): mean RA 3, PA 35/9, mean PCWP 9, CI 2.12, PAPi 8.7 - s/p  barostim 2/25 - Echo (12/25): EF<20%, normal RV 10. CKD stage 3 11. PAD: ABIs moderately decreased on right, mildly decreased on left.   FH: No sudden death or cardiomyopathy  Social History   Socioeconomic History   Marital status: Significant Other    Spouse name: Not on file   Number of children: 3   Years of education: Not on file   Highest education level: Not on file  Occupational History   Occupation: retired  Tobacco Use   Smoking status: Former    Current packs/day: 0.00    Types: Cigarettes    Quit date: 2021    Years since quitting: 5.0   Smokeless tobacco: Never  Vaping Use   Vaping status: Never Used  Substance and Sexual Activity   Alcohol use: Never   Drug use: Never   Sexual activity: Not on file  Other Topics Concern   Not on file  Social History Narrative  Not on file   Social Drivers of Health   Tobacco Use: Medium Risk (12/13/2024)   Patient History    Smoking Tobacco Use: Former    Smokeless Tobacco Use: Never    Passive Exposure: Not on file  Financial Resource Strain: Low Risk (12/07/2024)   Received from [facility]   Overall Financial Resource Strain (CARDIA)    How hard is it for you to pay for the very basics like food, housing, medical care, and heating?: Not hard at all  Food Insecurity: No Food Insecurity (12/07/2024)   Received from [facility]   Epic    Within the past 12 months, you worried that your food would  run out before you got the money to buy more.: Never true    Within the past 12 months, the food you bought just didn't last and you didn't have money to get more.: Never true  Transportation Needs: No Transportation Needs (12/07/2024)   Received [facility]    In the past 12 months, has lack of transportation kept you from medical appointments or from getting medications?: No    In the past 12 months, has lack of transportation kept you from meetings, work, or from getting things needed for daily living?: No  Physical Activity: Not on file  Stress: Not on file  Social Connections: Not on file  Intimate Partner Violence: Not on file  Depression (PHQ2-9): Low Risk (05/26/2024)   Depression (PHQ2-9)    PHQ-2 Score: 0  Alcohol Screen: Not on file  Housing: Low Risk (12/07/2024)   Received from [facility]    In the last 12 months, was there a time when you were not able to pay the mortgage or rent on time?: No    In the past 12 months, how many times have you moved where you were living?: 0    At any time in the past 12 months, were you homeless or living in a shelter (including now)?: No  Utilities: Not At Risk (12/07/2024)   Received from [facility]    In the past 12 months has the electric, gas, oil, or water company threatened to shut off services in your home?: No  Health Literacy: Not on file   ROS: All systems reviewed and negative except as per HPI.   Current Outpatient Medications  Medication Sig Dispense Refill   acetaminophen  (TYLENOL ) 650 MG CR tablet Take 650 mg by mouth every 8 (eight) hours as needed for pain.     albuterol  (PROVENTIL ) (2.5 MG/3ML) 0.083% nebulizer solution Take 2.5 mg by nebulization every 4 (four) hours as needed for wheezing or shortness of breath.     aspirin  81 MG EC tablet Take 81 mg by mouth daily.     BD PEN NEEDLE NANO 2ND GEN 32G X 4 MM MISC 3 (three) times daily.     carvedilol  (COREG ) 3.125 MG tablet Take 1 tablet  (3.125 mg total) by mouth 2 (two) times daily with a meal. 180 tablet 3   Continuous Glucose Sensor (DEXCOM G7 SENSOR) MISC SMARTSIG:1 Topical Every 10 Days     cyclobenzaprine  (FLEXERIL ) 5 MG tablet Take 5 mg by mouth 3 (three) times daily as needed.     dapagliflozin  propanediol (FARXIGA ) 10 MG TABS tablet TAKE 1 TABLET(10 MG) BY MOUTH DAILY BEFORE BREAKFAST 90 tablet 3   dicyclomine  (BENTYL ) 10 MG capsule TAKE 1 CAPSULE(10 MG) BY MOUTH IN THE MORNING AND  AT BEDTIME 180 capsule 0   digoxin  (LANOXIN ) 0.125 MG tablet TAKE ONE-HALF TABLET BY MOUTH EVERY DAY 45 tablet 1   ezetimibe  (ZETIA ) 10 MG tablet Take 1 tablet (10 mg total) by mouth daily. 30 tablet 6   famotidine  (PEPCID ) 40 MG tablet TAKE 1 TABLET(40 MG) BY MOUTH TWICE DAILY 60 tablet 2   folic acid  (FOLVITE ) 1 MG tablet Take 1 mg by mouth daily.     insulin  lispro (HUMALOG) 100 UNIT/ML KwikPen Inject 2-20 Units into the skin 3 (three) times daily with meals.  per sliding scale     isosorbide  mononitrate (IMDUR ) 30 MG 24 hr tablet Take 15 mg by mouth daily.     lidocaine  (LIDODERM ) 5 % Place 1 patch onto the skin daily. Remove & Discard patch within 12 hours or as directed by MD 5 patch 0   loratadine  (CLARITIN ) 10 MG tablet Take 10 mg by mouth daily.     losartan  (COZAAR ) 25 MG tablet Take 0.5 tablets (12.5 mg total) by mouth daily. 45 tablet 3   nitroGLYCERIN  (NITROSTAT ) 0.4 MG SL tablet DISSOLVE 1 TABLET UNDER TONGUE EVERY 5 MINUTES AS NEEDED FOR CHEST PAIN. 25 tablet 3   OZEMPIC, 0.25 OR 0.5 MG/DOSE, 2 MG/3ML SOPN Inject 0.5 mg into the skin every Monday.     pantoprazole  (PROTONIX ) 40 MG tablet Take 1 tablet (40 mg total) by mouth daily. TAKE 1 TABLET(40 MG) BY MOUTH DAILY 90 tablet 2   Plecanatide  (TRULANCE ) 3 MG TABS Take 1 tablet (3 mg total) by mouth as needed. 30 tablet 1   ranolazine  (RANEXA ) 500 MG 12 hr tablet Take 1 tablet (500 mg total) by mouth 2 (two) times daily. 180 tablet 3   rosuvastatin  (CRESTOR ) 40 MG tablet Take 40 mg  by mouth at bedtime.     spironolactone  (ALDACTONE ) 25 MG tablet Take 1 tablet (25 mg total) by mouth daily. 30 tablet 3   torsemide  (DEMADEX ) 20 MG tablet Take 1 tablet (20 mg total) by mouth daily. 40 mg in the morning and 20 mg in the evening, alternating with 20 mg Twice daily 180 tablet 3   TRESIBA FLEXTOUCH 200 UNIT/ML FlexTouch Pen Inject 35 Units into the skin 2 (two) times daily.     TRINTELLIX  5 MG TABS tablet Take 5 mg by mouth at bedtime.     Ubrogepant  50 MG TABS Take 50 mg by mouth daily as needed (Headache).     valACYclovir  (VALTREX ) 500 MG tablet Take 500 mg by mouth daily.     vitamin B-12 (CYANOCOBALAMIN ) 1000 MCG tablet Take 1,000 mcg by mouth daily.     Vitamin D , Ergocalciferol , (DRISDOL ) 1.25 MG (50000 UNIT) CAPS capsule Take 50,000 Units by mouth every 7 (seven) days.     clopidogrel  (PLAVIX ) 75 MG tablet Take 1 tablet (75 mg total) by mouth daily. TAKE 1 TABLET(75 MG) BY MOUTH DAILY (Patient not taking: Reported on 12/12/2024) 90 tablet 2   No current facility-administered medications for this encounter.   Wt Readings from Last 3 Encounters:  12/13/24 76.3 kg (168 lb 3.2 oz)  12/06/24 76.1 kg (167 lb 12.8 oz)  11/09/24 76.2 kg (168 lb)   BP 112/66   Pulse 96   Wt 76.3 kg (168 lb 3.2 oz)   SpO2 99%   BMI 27.99 kg/m   Physical Exam: General:  NAD. No resp difficulty, walked into clinic HEENT: Normal Neck: Supple. JVP 10 Cor: Regular rate & rhythm. No rubs, gallops or murmurs. +  TTP over chest wall Lungs: Clear Abdomen: Soft, nontender, nondistended.  Extremities: No cyanosis, clubbing, rash, edema Neuro: Alert & oriented x 3, moves all 4 extremities w/o difficulty. Affect pleasant.  Assessment/Plan: 1. Chronic systolic CHF: Ischemic cardiomyopathy.  [employer] CRT-D device.  Echo in 8/24 showed EF < 20%, moderate LV dilation, mild LVH, RV normal.  This is somewhat worse than priors. LHC/RHC in 11/24 showed low CI at 2.12, normal filling pressures,  unchanged coronary anatomy with no severe stenoses. S/p barostimulation activation therapy device 2/25. Echo from [facility] showed EF 20-25%, paradoxical LV motion. Echo 12/25 showed EF<20%, normal RV. NYHA class II-III symptoms, she is mildly volume overloaded on exam and by device interrogation.   - Increase torsemide  to 20 mg bid, add 20 KCL daily. BMET/pro-bnp today, repeat BMET in 10-14 days - Continue spironolactone  25 mg daily.   - Continue losartan  12.5 mg daily. - Continue Coreg  3.125 mg bid.  - Continue digoxin  0.0625 mg daily. Dig level 0.6 on 10/19/24 - She stopped Farxiga  due to multiple yeast infections.  - I worry she will soon need advanced therapies. Although she does not have significant dyspnea on exertion, device interrogation shows she is sedentary. Most recent echo showed EF< 20%. Arrange CPX soon to risk stratify.  2.  CAD: PCI to mid/distal LAD in 2020 and proximal LAD in 2021.  LHC in 11/24 showed stable coronary anatomy with no severe stenoses. She has had atypical chest pain, ECG today without acute ST-T changes. She is tender to palpitation over chest wall today, suspect MSK pain vs volume overload. We discussed ED precautions. - Continue ranolazine  500 mg bid.  - Continue ASA/Plavix .  - Continue Crestor  + Zetia , goal LDL < 55. 3. COPD: History of smoking, quit 2021.   4. CKD 3: Follows with nephology at [facility]. Avoid hypotension.  - BMET today 5. Obesity: Body mass index is 27.99 kg/m. - She has lost 30 lbs. Continue semaglutide. 6. PAD: Moderately decreased ABI on right in 7/25.  No claudication.  - She saw Dr. Serene, plan medical management.   Follow up in 6 weeks with Dr. Rolan to discuss next steps and CPX results.  Harlene HERO Paton, FNP 12/13/2024  "

## 2024-12-13 NOTE — Addendum Note (Signed)
 Encounter addended by: Glena Harlene HERO, FNP on: 12/13/2024 4:24 PM  Actions taken: Clinical Note Signed

## 2024-12-13 NOTE — Patient Instructions (Addendum)
 Good to see you today!   INCREASE torsemide  to 20 mg Twice daily  START potassium 20 meq( 1 tablet ) Daily  Labs done today, your results will be available in MyChart, we will contact you for abnormal readings.  Your physician has recommended that you have a cardiopulmonary stress test (CPX). CPX testing is a non-invasive measurement of heart and lung function. It replaces a traditional treadmill stress test. This type of test provides a tremendous amount of information that relates not only to your present condition but also for future outcomes. This test combines measurements of you ventilation, respiratory gas exchange in the lungs, electrocardiogram (EKG), blood pressure and physical response before, during, and following an exercise protocol.  Your physician recommends that you schedule a follow-up appointment 6 weeks  Call in February to schedule an appointment  If you have any questions or concerns before your next appointment please send us  a message through [facility] or call our office at [phone].    TO LEAVE A MESSAGE FOR THE NURSE SELECT OPTION 2, PLEASE LEAVE A MESSAGE INCLUDING: YOUR NAME DATE OF BIRTH CALL BACK NUMBER REASON FOR CALL**this is important as we prioritize the call backs  YOU WILL RECEIVE A CALL BACK THE SAME DAY AS LONG AS YOU CALL BEFORE 4:00 PM At the [facility], you and your health needs are our priority. As part of our continuing mission to provide you with exceptional heart care, we have created designated Provider Care Teams. These Care Teams include your primary Cardiologist (physician) and Advanced Practice Providers (APPs- Physician Assistants and Nurse Practitioners) who all work together to provide you with the care you need, when you need it.   You may see any of the following providers on your designated Care Team at your next follow up: Dr Toribio Fuel Dr Ezra Shuck Dr. Morene Brownie Greig Mosses, NP Caffie Shed,  [place] [facility] [place], [place] Beckey Coe, NP Jordan Lee, NP Ellouise Class, NP Tinnie Redman, PharmD Jaun Bash, PharmD   Please be sure to bring in all your medications bottles to every appointment.    Thank you for choosing [facility]

## 2024-12-14 ENCOUNTER — Emergency Department (HOSPITAL_COMMUNITY)

## 2024-12-14 ENCOUNTER — Encounter (HOSPITAL_COMMUNITY): Payer: Self-pay | Admitting: Emergency Medicine

## 2024-12-14 ENCOUNTER — Inpatient Hospital Stay (HOSPITAL_COMMUNITY): Admission: EM | Admit: 2024-12-14 | Source: Home / Self Care

## 2024-12-14 ENCOUNTER — Other Ambulatory Visit: Payer: Self-pay

## 2024-12-14 DIAGNOSIS — Z7901 Long term (current) use of anticoagulants: Secondary | ICD-10-CM

## 2024-12-14 DIAGNOSIS — K921 Melena: Secondary | ICD-10-CM

## 2024-12-14 DIAGNOSIS — F32A Depression, unspecified: Secondary | ICD-10-CM | POA: Diagnosis not present

## 2024-12-14 DIAGNOSIS — I429 Cardiomyopathy, unspecified: Secondary | ICD-10-CM

## 2024-12-14 DIAGNOSIS — Z794 Long term (current) use of insulin: Secondary | ICD-10-CM | POA: Diagnosis not present

## 2024-12-14 DIAGNOSIS — K219 Gastro-esophageal reflux disease without esophagitis: Secondary | ICD-10-CM | POA: Diagnosis present

## 2024-12-14 DIAGNOSIS — E119 Type 2 diabetes mellitus without complications: Secondary | ICD-10-CM

## 2024-12-14 DIAGNOSIS — R079 Chest pain, unspecified: Principal | ICD-10-CM

## 2024-12-14 DIAGNOSIS — E1122 Type 2 diabetes mellitus with diabetic chronic kidney disease: Secondary | ICD-10-CM | POA: Diagnosis not present

## 2024-12-14 DIAGNOSIS — N1832 Chronic kidney disease, stage 3b: Secondary | ICD-10-CM | POA: Diagnosis not present

## 2024-12-14 DIAGNOSIS — I130 Hypertensive heart and chronic kidney disease with heart failure and stage 1 through stage 4 chronic kidney disease, or unspecified chronic kidney disease: Secondary | ICD-10-CM

## 2024-12-14 DIAGNOSIS — N179 Acute kidney failure, unspecified: Secondary | ICD-10-CM | POA: Diagnosis not present

## 2024-12-14 DIAGNOSIS — I5023 Acute on chronic systolic (congestive) heart failure: Secondary | ICD-10-CM | POA: Diagnosis not present

## 2024-12-14 DIAGNOSIS — J449 Chronic obstructive pulmonary disease, unspecified: Secondary | ICD-10-CM | POA: Diagnosis not present

## 2024-12-14 DIAGNOSIS — J9601 Acute respiratory failure with hypoxia: Secondary | ICD-10-CM

## 2024-12-14 DIAGNOSIS — I5043 Acute on chronic combined systolic (congestive) and diastolic (congestive) heart failure: Secondary | ICD-10-CM

## 2024-12-14 DIAGNOSIS — M79601 Pain in right arm: Secondary | ICD-10-CM | POA: Diagnosis not present

## 2024-12-14 DIAGNOSIS — R570 Cardiogenic shock: Secondary | ICD-10-CM

## 2024-12-14 DIAGNOSIS — D62 Acute posthemorrhagic anemia: Secondary | ICD-10-CM | POA: Insufficient documentation

## 2024-12-14 DIAGNOSIS — M5412 Radiculopathy, cervical region: Secondary | ICD-10-CM | POA: Diagnosis not present

## 2024-12-14 DIAGNOSIS — I2510 Atherosclerotic heart disease of native coronary artery without angina pectoris: Secondary | ICD-10-CM | POA: Diagnosis not present

## 2024-12-14 DIAGNOSIS — Z95811 Presence of heart assist device: Secondary | ICD-10-CM

## 2024-12-14 DIAGNOSIS — J9611 Chronic respiratory failure with hypoxia: Secondary | ICD-10-CM

## 2024-12-14 DIAGNOSIS — R509 Fever, unspecified: Secondary | ICD-10-CM

## 2024-12-14 DIAGNOSIS — R109 Unspecified abdominal pain: Secondary | ICD-10-CM | POA: Diagnosis present

## 2024-12-14 DIAGNOSIS — K5521 Angiodysplasia of colon with hemorrhage: Secondary | ICD-10-CM

## 2024-12-14 LAB — COMPREHENSIVE METABOLIC PANEL WITH GFR
ALT: 11 U/L (ref 0–44)
AST: 17 U/L (ref 15–41)
Albumin: 4.1 g/dL (ref 3.5–5.0)
Alkaline Phosphatase: 64 U/L (ref 38–126)
Anion gap: 10 (ref 5–15)
BUN: 25 mg/dL — ABNORMAL HIGH (ref 8–23)
CO2: 24 mmol/L (ref 22–32)
Calcium: 9.5 mg/dL (ref 8.9–10.3)
Chloride: 96 mmol/L — ABNORMAL LOW (ref 98–111)
Creatinine, Ser: 1.93 mg/dL — ABNORMAL HIGH (ref 0.44–1.00)
GFR, Estimated: 27 mL/min — ABNORMAL LOW
Glucose, Bld: 255 mg/dL — ABNORMAL HIGH (ref 70–99)
Potassium: 4.8 mmol/L (ref 3.5–5.1)
Sodium: 131 mmol/L — ABNORMAL LOW (ref 135–145)
Total Bilirubin: 0.5 mg/dL (ref 0.0–1.2)
Total Protein: 7.4 g/dL (ref 6.5–8.1)

## 2024-12-14 LAB — CBC
HCT: 37 % (ref 36.0–46.0)
Hemoglobin: 12.2 g/dL (ref 12.0–15.0)
MCH: 31.7 pg (ref 26.0–34.0)
MCHC: 33 g/dL (ref 30.0–36.0)
MCV: 96.1 fL (ref 80.0–100.0)
Platelets: 266 K/uL (ref 150–400)
RBC: 3.85 MIL/uL — ABNORMAL LOW (ref 3.87–5.11)
RDW: 13.1 % (ref 11.5–15.5)
WBC: 11.3 K/uL — ABNORMAL HIGH (ref 4.0–10.5)
nRBC: 0 % (ref 0.0–0.2)

## 2024-12-14 LAB — TROPONIN T, HIGH SENSITIVITY
Troponin T High Sensitivity: 25 ng/L — ABNORMAL HIGH (ref 0–19)
Troponin T High Sensitivity: 25 ng/L — ABNORMAL HIGH (ref 0–19)

## 2024-12-14 LAB — DIGOXIN LEVEL: Digoxin Level: 1.2 ng/mL (ref 0.8–2.0)

## 2024-12-14 LAB — CBG MONITORING, ED
Glucose-Capillary: 136 mg/dL — ABNORMAL HIGH (ref 70–99)
Glucose-Capillary: 235 mg/dL — ABNORMAL HIGH (ref 70–99)

## 2024-12-14 LAB — PRO BRAIN NATRIURETIC PEPTIDE: Pro Brain Natriuretic Peptide: 1539 pg/mL — ABNORMAL HIGH

## 2024-12-14 LAB — LIPASE, BLOOD: Lipase: 23 U/L (ref 11–51)

## 2024-12-14 LAB — I-STAT CG4 LACTIC ACID, ED: Lactic Acid, Venous: 0.7 mmol/L (ref 0.5–1.9)

## 2024-12-14 MED ORDER — LIDOCAINE 5 % EX PTCH
1.0000 | MEDICATED_PATCH | CUTANEOUS | Status: DC
  Administered 2024-12-15 – 2024-12-16 (×2): 1 via TRANSDERMAL
  Filled 2024-12-14 (×8): qty 1

## 2024-12-14 MED ORDER — LORATADINE 10 MG PO TABS
10.0000 mg | ORAL_TABLET | Freq: Every day | ORAL | Status: DC
  Administered 2024-12-15 – 2024-12-22 (×8): 10 mg via ORAL
  Filled 2024-12-14 (×8): qty 1

## 2024-12-14 MED ORDER — CYCLOBENZAPRINE HCL 10 MG PO TABS
5.0000 mg | ORAL_TABLET | Freq: Three times a day (TID) | ORAL | Status: DC | PRN
  Administered 2024-12-14 – 2024-12-21 (×4): 5 mg via ORAL
  Filled 2024-12-14 (×5): qty 1

## 2024-12-14 MED ORDER — ASPIRIN 81 MG PO TBEC
81.0000 mg | DELAYED_RELEASE_TABLET | Freq: Every day | ORAL | Status: DC
  Administered 2024-12-14 – 2024-12-22 (×9): 81 mg via ORAL
  Filled 2024-12-14 (×9): qty 1

## 2024-12-14 MED ORDER — FOLIC ACID 1 MG PO TABS
1.0000 mg | ORAL_TABLET | Freq: Every day | ORAL | Status: DC
  Administered 2024-12-15 – 2024-12-22 (×8): 1 mg via ORAL
  Filled 2024-12-14 (×8): qty 1

## 2024-12-14 MED ORDER — LIDOCAINE VISCOUS HCL 2 % MT SOLN
15.0000 mL | Freq: Once | OROMUCOSAL | Status: AC
  Administered 2024-12-14: 15 mL via ORAL
  Filled 2024-12-14: qty 15

## 2024-12-14 MED ORDER — EZETIMIBE 10 MG PO TABS
10.0000 mg | ORAL_TABLET | Freq: Every day | ORAL | Status: DC
  Administered 2024-12-15 – 2024-12-22 (×8): 10 mg via ORAL
  Filled 2024-12-14 (×8): qty 1

## 2024-12-14 MED ORDER — VORTIOXETINE HBR 5 MG PO TABS
5.0000 mg | ORAL_TABLET | Freq: Every day | ORAL | Status: DC
  Administered 2024-12-14 – 2024-12-25 (×12): 5 mg via ORAL
  Filled 2024-12-14 (×13): qty 1

## 2024-12-14 MED ORDER — ACETAMINOPHEN 650 MG RE SUPP: 650.0000 mg | Freq: Four times a day (QID) | RECTAL | Status: DC | PRN

## 2024-12-14 MED ORDER — INSULIN ASPART 100 UNIT/ML IJ SOLN
0.0000 [IU] | Freq: Three times a day (TID) | INTRAMUSCULAR | Status: DC
  Administered 2024-12-14: 2 [IU] via SUBCUTANEOUS
  Administered 2024-12-15: 3 [IU] via SUBCUTANEOUS
  Administered 2024-12-15 – 2024-12-16 (×2): 2 [IU] via SUBCUTANEOUS
  Administered 2024-12-16 – 2024-12-17 (×3): 3 [IU] via SUBCUTANEOUS
  Administered 2024-12-17: 5 [IU] via SUBCUTANEOUS
  Administered 2024-12-18: 2 [IU] via SUBCUTANEOUS
  Administered 2024-12-18 – 2024-12-19 (×2): 5 [IU] via SUBCUTANEOUS
  Administered 2024-12-19: 3 [IU] via SUBCUTANEOUS
  Administered 2024-12-19 – 2024-12-20 (×4): 2 [IU] via SUBCUTANEOUS
  Administered 2024-12-21 (×2): 3 [IU] via SUBCUTANEOUS
  Administered 2024-12-21: 2 [IU] via SUBCUTANEOUS
  Administered 2024-12-22: 3 [IU] via SUBCUTANEOUS
  Administered 2024-12-22: 5 [IU] via SUBCUTANEOUS
  Administered 2024-12-23: 3 [IU] via SUBCUTANEOUS
  Filled 2024-12-14 (×3): qty 2
  Filled 2024-12-14: qty 5
  Filled 2024-12-14: qty 3
  Filled 2024-12-14: qty 5
  Filled 2024-12-14 (×2): qty 3
  Filled 2024-12-14: qty 2
  Filled 2024-12-14: qty 3
  Filled 2024-12-14: qty 5
  Filled 2024-12-14: qty 3
  Filled 2024-12-14: qty 5
  Filled 2024-12-14 (×3): qty 2
  Filled 2024-12-14 (×2): qty 3
  Filled 2024-12-14 (×2): qty 2

## 2024-12-14 MED ORDER — MORPHINE SULFATE (PF) 2 MG/ML IV SOLN
2.0000 mg | INTRAVENOUS | Status: DC | PRN
  Administered 2024-12-15 (×2): 2 mg via INTRAVENOUS
  Filled 2024-12-14 (×3): qty 1

## 2024-12-14 MED ORDER — ALUM & MAG HYDROXIDE-SIMETH 200-200-20 MG/5ML PO SUSP
15.0000 mL | Freq: Once | ORAL | Status: AC
  Administered 2024-12-14: 15 mL via ORAL
  Filled 2024-12-14: qty 30

## 2024-12-14 MED ORDER — RANOLAZINE ER 500 MG PO TB12
500.0000 mg | ORAL_TABLET | Freq: Two times a day (BID) | ORAL | Status: DC
  Administered 2024-12-14 – 2024-12-22 (×17): 500 mg via ORAL
  Filled 2024-12-14 (×18): qty 1

## 2024-12-14 MED ORDER — ALUM & MAG HYDROXIDE-SIMETH 200-200-20 MG/5ML PO SUSP
30.0000 mL | Freq: Once | ORAL | Status: AC
  Administered 2024-12-14: 30 mL via ORAL
  Filled 2024-12-14: qty 30

## 2024-12-14 MED ORDER — DICYCLOMINE HCL 10 MG PO CAPS
10.0000 mg | ORAL_CAPSULE | Freq: Three times a day (TID) | ORAL | Status: DC
  Administered 2024-12-14 – 2024-12-15 (×5): 10 mg via ORAL
  Filled 2024-12-14 (×5): qty 1

## 2024-12-14 MED ORDER — DIGOXIN 125 MCG PO TABS: 0.0625 mg | ORAL_TABLET | Freq: Every day | ORAL | Status: DC

## 2024-12-14 MED ORDER — UBROGEPANT 50 MG PO TABS: 50.0000 mg | ORAL_TABLET | Freq: Every day | ORAL | Status: DC | PRN

## 2024-12-14 MED ORDER — HEPARIN SODIUM (PORCINE) 5000 UNIT/ML IJ SOLN
5000.0000 [IU] | Freq: Three times a day (TID) | INTRAMUSCULAR | Status: DC
  Administered 2024-12-14 – 2024-12-15 (×2): 5000 [IU] via SUBCUTANEOUS
  Filled 2024-12-14 (×2): qty 1

## 2024-12-14 MED ORDER — NITROGLYCERIN 0.4 MG SL SUBL
0.4000 mg | SUBLINGUAL_TABLET | SUBLINGUAL | Status: DC | PRN
  Administered 2024-12-23: 0.4 mg via SUBLINGUAL
  Filled 2024-12-14: qty 1

## 2024-12-14 MED ORDER — VITAMIN B-12 1000 MCG PO TABS
1000.0000 ug | ORAL_TABLET | Freq: Every day | ORAL | Status: DC
  Administered 2024-12-15 – 2024-12-22 (×8): 1000 ug via ORAL
  Filled 2024-12-14 (×8): qty 1

## 2024-12-14 MED ORDER — FUROSEMIDE 10 MG/ML IJ SOLN
60.0000 mg | Freq: Once | INTRAMUSCULAR | Status: AC
  Administered 2024-12-14: 60 mg via INTRAVENOUS
  Filled 2024-12-14: qty 6

## 2024-12-14 MED ORDER — ROSUVASTATIN CALCIUM 20 MG PO TABS: 40.0000 mg | ORAL_TABLET | Freq: Every day | ORAL | Status: DC

## 2024-12-14 MED ORDER — INSULIN GLARGINE 100 UNIT/ML ~~LOC~~ SOLN
25.0000 [IU] | Freq: Two times a day (BID) | SUBCUTANEOUS | Status: DC
  Administered 2024-12-15 – 2024-12-23 (×17): 25 [IU] via SUBCUTANEOUS
  Filled 2024-12-14 (×22): qty 0.25

## 2024-12-14 MED ORDER — EMPAGLIFLOZIN 10 MG PO TABS
10.0000 mg | ORAL_TABLET | Freq: Every day | ORAL | Status: DC
  Administered 2024-12-14 – 2024-12-19 (×6): 10 mg via ORAL
  Filled 2024-12-14 (×7): qty 1

## 2024-12-14 MED ORDER — PANTOPRAZOLE SODIUM 40 MG IV SOLR
40.0000 mg | Freq: Once | INTRAVENOUS | Status: AC
  Administered 2024-12-14: 40 mg via INTRAVENOUS
  Filled 2024-12-14: qty 10

## 2024-12-14 MED ORDER — PANTOPRAZOLE SODIUM 40 MG PO TBEC
40.0000 mg | DELAYED_RELEASE_TABLET | Freq: Every day | ORAL | Status: DC
  Administered 2024-12-15 – 2024-12-22 (×8): 40 mg via ORAL
  Filled 2024-12-14 (×8): qty 1

## 2024-12-14 MED ORDER — CLOPIDOGREL BISULFATE 75 MG PO TABS
75.0000 mg | ORAL_TABLET | Freq: Every day | ORAL | Status: DC
  Administered 2024-12-15: 75 mg via ORAL
  Filled 2024-12-14 (×2): qty 1

## 2024-12-14 MED ORDER — ALBUTEROL SULFATE (2.5 MG/3ML) 0.083% IN NEBU: 2.5000 mg | INHALATION_SOLUTION | RESPIRATORY_TRACT | Status: DC | PRN

## 2024-12-14 MED ORDER — DICLOFENAC SODIUM 1 % EX GEL
2.0000 g | Freq: Four times a day (QID) | CUTANEOUS | Status: DC
  Administered 2024-12-15 – 2024-12-22 (×19): 2 g via TOPICAL
  Filled 2024-12-14 (×2): qty 100

## 2024-12-14 MED ORDER — DAPAGLIFLOZIN PROPANEDIOL 10 MG PO TABS: 10.0000 mg | ORAL_TABLET | Freq: Every day | ORAL | Status: DC

## 2024-12-14 MED ORDER — ACETAMINOPHEN 325 MG PO TABS
650.0000 mg | ORAL_TABLET | Freq: Four times a day (QID) | ORAL | Status: DC | PRN
  Administered 2024-12-14: 650 mg via ORAL
  Filled 2024-12-14: qty 2

## 2024-12-14 MED ORDER — VALACYCLOVIR HCL 500 MG PO TABS
500.0000 mg | ORAL_TABLET | Freq: Every day | ORAL | Status: AC
  Administered 2024-12-15 – 2024-12-25 (×10): 500 mg via ORAL
  Filled 2024-12-14 (×13): qty 1

## 2024-12-14 MED ORDER — ISOSORBIDE MONONITRATE ER 30 MG PO TB24
15.0000 mg | ORAL_TABLET | Freq: Every day | ORAL | Status: DC
  Administered 2024-12-15 – 2024-12-22 (×8): 15 mg via ORAL
  Filled 2024-12-14 (×8): qty 1

## 2024-12-14 MED ORDER — VITAMIN D (ERGOCALCIFEROL) 1.25 MG (50000 UNIT) PO CAPS
50000.0000 [IU] | ORAL_CAPSULE | ORAL | Status: AC
  Administered 2024-12-18 – 2025-01-01 (×3): 50000 [IU] via ORAL
  Filled 2024-12-14 (×3): qty 1

## 2024-12-14 NOTE — H&P (Signed)
 " Date: 12/14/2024               Patient Name:  Karen Warner MRN: [id number]  DOB: Apr 01, 1951 Age / Sex: 74 y.o., female   PCP: Silver Lamar LABOR, MD         Medical Service: Internal Medicine Teaching Service         Attending Physician: Dr. Eben Reyes BROCKS, MD      First Contact: Sallyanne Primas, DO    Second Contact: Dr. Lonni Africa, DO         After Hours (After 5p/  First Contact Pager: [phone]  weekends / holidays): Second Contact Pager: [phone]   SUBJECTIVE   Chief Complaint: Chest pain  History of Present Illness: Karen Warner with a PMH of CAD s/p stents x3, pacemaker palcement, Boroughs stimulator, ischemic cardiomyopathy, COPD, fibromyalgia, CKD, diverticulosis, arthritis, iron deficiency anemia, depression, and GERD who is presenting for chest pain and SOB over the past 3 days. She describes the chest pain as central, radiating towards the right neck and arm, and that it hurts to breath. However, she also states she has a history of fibromyalgia and that this right arm pain and neck pain have been chronic over the past 6 months and she is unable to discern whether the chest pain is exacerbating her chronic pain or if the chest pain is radiating towards the arm.   She presented to [facility] yesterday for HF follow up and was also evaluated for this chest pain. Cardiology stated she was volume overloaded on exam and by device interrogation so torsemide  was subsequently increased. Patient states last night the pain was so bad it woke her up from her sleep. She said the pain was not alleviated by position changes and was most painful when laying flat. Chest pain is also reproducible on exam. She has also been feeling more fatigued since leaving doctors as well. She took a nitroglycerin  this morning without relief.   Patient also had a few other complaints but upon further interrogation, they were chronic in nature and not acutely worsening.  She was describing symptoms of neck pain and a feeling of vibrations in her right arm and neck. This has been a complaint since her defibrillator was inserted in February. She has discussed this multiple times with her cardiologist. She is also getting epidural steroid injections and trigger point injections for cervical radiculopathy. She took flexeril  and tylenol  which did not help with the back or chest pain last night. She also states has chronic constipation and has been on several medications for this in the past, all of which have caused her diarrhea and stomach pain. Patient was complaining of abdominal pain, bloating, and headache today, but upon further interrogation, she believes the headache is because she is hungry and there is no new worsening stomach pain today.    Patient denies cough, fevers, nausea/vomiting.  ED Course: Vitals showed pulse in the 70s, BP 110/74, RR 19, satting 100% on RA. Patient was given 50mcg fentanyl  en route to the ED and lidocaine  and mylanta in the ED without significant relief. Hs trop 25>>25. EKG interpretation difficult d/t barostim artifact. Elevated proBNP 1342 on 1/13 ->  1539 on 1/14.  Lactic acid 0.7. SCr at 1.93 (baseline ~1.5). Lipase normal. LFTs including Tbilli level normal. CXR w/ no acute findings. CT A/P shows sludge within the gallbladder without acute cholecystitis, also w/ incidental findings of bilateral renal cyst, left lesion new compared  to prior study measuring 1.9 cm. RUQ US  also showed minimal amount of gallbladder sludge but no other acute abnormalities.    Past Medical History Past Medical History:  Diagnosis Date   AICD (automatic cardioverter/defibrillator) present    Anemia 12/12/2019   Anginal pain    Arthralgia 01/14/2020   Arthritis    Asthma    Atherosclerotic heart disease of native coronary artery without angina pectoris 04/13/2014   CHF (congestive heart failure) (HCC)    Chronic bilateral low back pain with  bilateral sciatica 01/13/2019   Chronic bladder pain 11/08/2014   Last Assessment & Plan:  Formatting of this note might be different from the original. Patient has chronic pain syndrome in general I think this is unfortunately worsening her pelvic pain and bladder pain her examination today was very reassuring I am advised her to use the estrogen cream I did start her on Elavil 10 milligrams at that time if she does tolerated will increase it to 25 milligrams.    Chronic headaches    Chronic idiopathic constipation 12/22/2014   Formatting of this note might be different from the original. Last Assessment & Plan:  For better bowel emptying please use either Citrucel / Benefiber start with 2 tablespoons daily and titrate up or down to effect.  Also please use glycerin  suppositories as needed to assist in evacuation. Last Assessment & Plan:  Formatting of this note might be different from the original. For better bowel empt   Chronic obstructive pulmonary disease, unspecified (HCC) 03/18/2018   CKD (chronic kidney disease) stage 3, GFR 30-59 ml/min (HCC)    Class 1 obesity due to excess calories with serious comorbidity and body mass index (BMI) of 31.0 to 31.9 in adult 06/26/2020   Colon polyps    COPD (chronic obstructive pulmonary disease) (HCC)    Coronary artery disease    DDD (degenerative disc disease), cervical 01/13/2019   Depression 11/05/2019   Diabetic polyneuropathy associated with type 2 diabetes mellitus (HCC) 02/24/2019   Diverticulitis 05/20/2018   Diverticulitis of colon 03/18/2018   Family history of ischemic heart disease (IHD) 08/16/2013   Fibromyalgia    Fibromyalgia affecting multiple sites 01/14/2020   Gastro-esophageal reflux disease without esophagitis 01/13/2019   Glaucoma 11/08/2014   Hordeolum externum of left upper eyelid 03/13/2020   Hypertension 11/08/2014   IBS (irritable bowel syndrome)    Iron deficiency anemia 01/13/2019   Left renal mass 11/08/2019    Leukocytosis 05/28/2018   Low vitamin B12 level 03/13/2020   Low vitamin D  level 12/12/2019   LV dysfunction 05/31/2019   Malignant essential hypertension 01/22/2016   Migraine, unspecified, not intractable, without status migrainosus 11/08/2014   Mixed hyperlipidemia 01/13/2019   Myocardial infarction (HCC)    Other premature beats 11/08/2014   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 Last Assessment & Plan:  For vaginal atrophy please place a pea size dab of Estrogen vaginal cream  into the vagina 3 times a week ( Monday, Wednesday, Friday) Last Assessment & Plan:  Formatting of this note might be different from the original. For vaginal atrophy please place a pea size  dab of Estrogen vaginal cream  into the vagina      Meds:  Active Medications[1]  Past Surgical History  Past Surgical History:  Procedure Laterality Date   ABDOMINAL HYSTERECTOMY     BIV ICD INSERTION CRT-D N/A 12/31/2020   Procedure: BIV ICD INSERTION CRT-D;  Surgeon: Waddell Danelle ORN, MD;  Location: [facility] INVASIVE CV LAB;  Service: Cardiovascular;  Laterality: N/A;   BLEPHAROPLASTY Bilateral    COLON SURGERY     CORONARY ANGIOPLASTY WITH STENT PLACEMENT  2020   X3   CORONARY PRESSURE/FFR STUDY N/A 08/03/2020   Procedure: INTRAVASCULAR PRESSURE WIRE/FFR STUDY;  Surgeon: Wonda Sharper, MD;  Location: [facility] INVASIVE CV LAB;  Service: Cardiovascular;  Laterality: N/A;   CORONARY STENT INTERVENTION N/A 08/03/2020   Procedure: CORONARY STENT INTERVENTION;  Surgeon: Wonda Sharper, MD;  Location: [facility] INVASIVE CV LAB;  Service: Cardiovascular;  Laterality:  N/A;   LEFT HEART CATH AND CORONARY ANGIOGRAPHY N/A 08/03/2020   Procedure: LEFT HEART CATH AND CORONARY ANGIOGRAPHY;  Surgeon: Wonda Sharper, MD;  Location: [facility] INVASIVE CV LAB;  Service: Cardiovascular;  Laterality: N/A;   RADIOFREQUENCY ABLATION  07/2023   REFRACTIVE SURGERY Left    [place]    RIGHT/LEFT HEART CATH AND CORONARY ANGIOGRAPHY N/A 10/13/2023   Procedure: RIGHT/LEFT HEART CATH AND CORONARY ANGIOGRAPHY;  Surgeon: Rolan Ezra RAMAN, MD;  Location: [facility] INVASIVE CV LAB;  Service: Cardiovascular;  Laterality: N/A;    Social:  Lives With: significant other and his daughter 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 No current alcohol use and denies illicit drug use.   Family History: No pertinent family history  Allergies: Allergies as of 12/14/2024 - Review Complete 12/14/2024  Allergen Reaction Noted   Bee venom Anaphylaxis 02/01/2018   Sumatriptan Shortness Of Breath 11/02/2020   Amoxicillin-pot clavulanate Diarrhea and Nausea And Vomiting 11/08/2014   Oxycodone Itching and Hives 11/08/2014   Buprenorphine hcl  04/19/2012   Clarithromycin Diarrhea 04/19/2012   Duloxetine  hcl Other (See Comments) 04/19/2012   Hydrocodone Itching 04/19/2012   Lactose  05/26/2018   Liraglutide Other (See Comments) 04/19/2012   Tramadol hcl Itching 04/19/2012    Review of Systems: A complete ROS was negative except as per HPI.   OBJECTIVE:   Physical Exam: Blood pressure 117/61, pulse 70, temperature 97.9 F (36.6 C), temperature source Oral, resp. rate 13, SpO2 100%.  Constitutional: fatigued-appearing elderly woman sitting in bed, in no acute distress HENT: mucous membranes moist Eyes: conjunctiva non-erythematous Neck: tenderness to palpation, particularly in the right side Cardiovascular: regular rate and rhythm, no m/r/g. 2+ radial  pulse and 2+ DP.  Chest pain reproducible.  Pacemaker scar present on left chest wall and cardiac device implant scar present on right chest wall.  No signs of erythema, swelling, warmth, irritation. Pulmonary/Chest: normal work of breathing on room air, lungs clear to auscultation bilaterally Abdominal: tenderness to palpation in the epigastric and RUQ, abdomen mildly-distended MSK: Patient has tenderness to palpation on the entire chest wall, right neck, and right shoulder.  Skin: warm and dry  Labs: CBC    Component Value Date/Time   WBC 11.3 (H) 12/14/2024 0826   RBC 3.85 (L) 12/14/2024 0826   HGB 12.2 12/14/2024 0826   HGB 12.5 12/06/2024 1556   HGB 12.8 06/16/2023 1441   HCT 37.0 12/14/2024 0826   HCT 39.3 06/16/2023 1441   PLT 266 12/14/2024  0826   PLT 307 12/06/2024 1556   MCV 96.1 12/14/2024 0826   MCV 89 06/16/2023 1441   MCV 86 02/14/2021 0000   MCH 31.7 12/14/2024 0826   MCHC 33.0 12/14/2024 0826   RDW 13.1 12/14/2024 0826   RDW 13.5 06/16/2023 1441   LYMPHSABS 2.7 12/06/2024 1556   LYMPHSABS 2.0 06/16/2023 1441   MONOABS 0.8 12/06/2024 1556   EOSABS 0.2 12/06/2024 1556   EOSABS 0.2 06/16/2023 1441   BASOSABS 0.0 12/06/2024 1556   BASOSABS 0.0 06/16/2023 1441     CMP     Component Value Date/Time   NA 131 (L) 12/14/2024 0826   NA 138 10/10/2024 1609   K 4.8 12/14/2024 0826   CL 96 (L) 12/14/2024 0826   CO2 24 12/14/2024 0826   GLUCOSE 255 (H) 12/14/2024 0826   BUN 25 (H) 12/14/2024 0826   BUN 25 10/10/2024 1609   CREATININE 1.93 (H) 12/14/2024 0826   CREATININE 1.51 (H) 12/06/2024 1556   CALCIUM  9.5 12/14/2024 0826   PROT 7.4 12/14/2024 0826   PROT 7.4 02/26/2023 1234   ALBUMIN 4.1 12/14/2024 0826   ALBUMIN 4.1 02/26/2023 1234   AST 17 12/14/2024 0826   AST 24 12/06/2024 1556   ALT 11 12/14/2024 0826   ALT 12 12/06/2024 1556   ALKPHOS 64 12/14/2024 0826   BILITOT 0.5 12/14/2024 0826   BILITOT 0.4 12/06/2024 1556   GFRNONAA 27 (L) 12/14/2024 0826    GFRNONAA 36 (L) 12/06/2024 1556   GFRAA 45 (L) 01/16/2021 1226    Imaging:  RUQ US : Minimal amount of sludge may be present within gallbladder lumen. No other abnormality seen in the right upper quadrant of the abdomen.  CT abd/pelvis: No explanation for epigastric pain. Sludge within the gallbladder without acute cholecystitis. New 1.9 cm intermediate density left renal lesion, not seen on the comparison exam, indeterminate; recommend renal protocol MRI with and without intravenous contrast for characterization. Additional probable benign bilateral renal cysts.   CXR: No acute findings, cardiomegaly.   EKG: personally reviewed my interpretation is EKG interpretation difficult d/t barostim artifact.. Prior EKG with no significant pain.  ASSESSMENT & PLAN:   Assessment & Plan by Problem: Principal Problem:   Chest pain   Karen Warner is a 74 y.o. person living with a history of CAD s/p stents x3, pacemaker placement, Boroughs stimulator ischemic cardiomyopathy, COPD, fibromyalgia, CKD, diverticulosis, depression, and GERD who presented with chest pain and admitted for cardiac catheterization on hospital day 0  Chest pain Chest pain x3 days with mixed characteristics and unresolved with nitroglycerin . Etiology likely multifactorial, including GI and musculoskeletal components; however, given extensive CAD with prior LAD stenting, cardiac etiology remains a concern although ACS workup negative (High sensitivity troponin flat at 25. EKG interpretation limited by barostim artifact).  Patient also has cardiac implanted device on right chest wall which follows radiation of symptoms.  No signs of infection on physical exam no leukocytosis, afebrile. Lipase, CT A/P, and RUQ ultrasound without acute intraabdominal pathology. Patient's pain is reproducible on exam also concerning for MSK etiology.  - Continue telemetry monitoring - Cardiology following - Plan LHC/RHC tomorrow to  assess LAD stent patency and hemodynamics - If cath unrevealing, consider GI etiology; GI consult and possible EGD if symptoms persist - continue lidocaine  patch for MSK pain - continue voltaren  gel - Continue Ranexa  500 mg twice daily - continue bentyl  and pantoprazole  for possible GI component - nitroglycerin  PRN -Tylenol  600 mg every 6 hours  as needed - Morphine  2 mg IV every 2 hours as needed  Acute on chronic systolic heart failure, HFrEF, EF ~20% Known ischemic cardiomyopathy with [employer] CRT-D and barostim device. Recent echocardiography with EF <20%, mild LV dilation, normal RV, NYHA class III symptoms. Exam and device interrogation consistent with volume overload, though concern exists for low-output physiology given fatigue and worsening symptoms. - given IV Lasix  60 mg IV x1, monitor response - Continue Imdur  15 mg daily - Hold losartan  and spironolactone  due to AKI - Hold Coreg  given concern for low output state - Hold digoxin  given elevated SCr - Continue jardiance  with close renal monitoring - continue digoxin  0.0625 mg daily - Monitor strict I/Os, daily weights - Cardiology concerned patient may be approaching advanced HF ? evaluation ongoing - Device interrogation: ~99% BiV pacing  Acute kidney injury on CKD stage IIIb Baseline creatinine ~1.5; admission Cr 1.9-1.93. Likely multifactorial including cardiorenal physiology and diuresis. - Monitor daily BMP - Avoid nephrotoxic medications - Anticipate contrast exposure with planned cath ? monitor renal function closely  Coronary artery disease History of proximal and mid-LAD stents. Last cath (10/2023) showed patent stents with ~40% in-stent stenosis. - Continue ASA 81 mg daily - Continue Plavix  75 mg daily - Continue rosuvastatin  40 mg nightly - Continue Ranolazine  500 mg BID - continue ezetimibe  10mg  daily - Plan repeat LHC pending renal function  Chronic pain/Fibromyalgia/Cervical radiculopathy   Chronic neck and arm pain predating admission - continue tylenol  PRN - continue flexeril  PRN - continue lidocaine  patch - Ubrogepant  50 mg daily for HA PRN  COPD Allergies - continue albuterol  PRN - Continue Claritin  10 mg daily  Incidental renal lesion CT A/P with new 1.9 cm intermediate-density left renal lesion, indeterminate, with additional likely benign bilateral renal cysts. - No acute intervention - Recommend renal-protocol MRI with and without contrast as outpatient  Type 2 diabetes Last A1c 6.3 on 12/9 - Continue basal insulin  (lantus  25u BID) - continue SSI - Continue jardiance  - Glucose monitoring per protocol  GERD - continue protonix  40mg  daily  Depression - home trintellix  5mg  nightly - continue vitamin B12, folic acid , and vitamin D   Other medications: Vitamins and acyclovir  Diet: Normal, NPO at midnight VTE: Heparin  IVF: None,None Code: Full, patient stated temporary intubation  Prior to Admission Living Arrangement: Home, living significant other and his daughter Anticipated Discharge Location: Home Barriers to Discharge: Clinical improvement  Dispo: Admit patient to Observation with expected length of stay less than 2 midnights.  Signed: Milburn Pennant, Medical Student  Please page on call intern or resident: First contact: [phone] If no answer in 15 minutes, please contact senior pager at [phone]    ______________________________________________________________________________________________   I was personally present and re-performed the exam and medical decision making and verified the service and findings are accurately documented in the student's note.  Mike Berntsen, DO 12/14/2024 7:20 PM      [1]  No outpatient medications have been marked as taking for the 12/14/24 encounter [facility] Encounter).   "

## 2024-12-14 NOTE — Progress Notes (Signed)
 Heart Failure Navigator Progress Note  Assessed for Heart & Vascular TOC clinic readiness.  Patient does not meet criteria due to she is an Advanced Heart Failure Team patient of Dr. Rolan. .   Navigator will sign off at this time.   Stephane Haddock, BSN, Scientist, clinical (histocompatibility and immunogenetics) Only

## 2024-12-14 NOTE — ED Triage Notes (Addendum)
 Pt BIB [facility] from home with reports of chest pain that started yesterday. Pt reports taking nitroglycerin  and asprin PTA. Pt with hx of STEMI and has pacemaker. Pt was given 50mcg fentanyl  en route.

## 2024-12-14 NOTE — Consult Note (Addendum)
 "   Advanced Heart Failure Team Consult Note   Primary Physician: Silver Lamar LABOR, MD Cardiologist:  Dub Huntsman, DO AHF Cardiologist: Dr. Rolan  HPI:    Karen Warner is seen today for evaluation of a/c systolic heart failure at the request of Dr. Towana, Emergency Medicine.   Karen Warner is 74 y.o. with history of CAD and ischemic cardiomyopathy and COPD. Patient had PCI to mid/distal LAD in 2020, then had unstable angina in 9/21 with DES to 70% stenosis proximal LAD.  She has had a cardiomyopathy for a number of years, [employer] CRT-D device placed in 2022.  Last echo in 8/24 showed EF < 20%, moderate LV dilation, mild LVH, RV normal.  She additionally has a diagnosis of COPD, quit smoking in 2021. She lives in [place], originally from [place]  but moved here to be near family.    LHC/RHC in 11/24 showed patent LAD stents with distal edge dissection in the apical LAD (no change from prior films), normal filling pressures, CI low at 2.12.    S/p barostimulation activation therapy device implant 2/25.   Echo 12/25 showed EF <20%, RV normal.    Seen in [facility] yesterday and felt to be mildly volume overloaded on exam and device interrogation, HL Score 19, no VT, 99% BiV. She endorsed NYHA Class II-III symptoms. She was instructed to increase her torsemide  to 20 mg bid and return for outpatient f/u but ultimately came to the ED for evaluation for chest pain.   She has mixed CP features. Some components reminiscent of previous angina but also w/ features more c/w GI and musculoskeletal etiologies. She describes substernal chest pressure, feels like an elephant on her chest w/ radiation to rt neck and back. Worse w/ movement/exertion. Also w/ RUQ pain worse after meals. Also endorses chest wall pain, reproducible w/ palpation. Before coming to ED, she took 1 SL NGT that did not resolve the pain. Hs trop 25>>25. EKG interpretation difficult d/t barostim  artifact. CXR w/ no acute findings. Lipase normal. LFTs including Tbilli level normal. CT A/P shows sludge within the gallbladder without acute cholecystitis, also w/ incidental findings of bilateral renal cyst, left lesion new compared to prior study measuring 1.9 cm. RUQ US  also showed minimal amount of gallbladder sludge but no other acute abnormalities. Lactic acid 0.7. SCr at 1.93 (baseline ~1.5).     Home Medications Prior to Admission medications  Medication Sig Start Date End Date Taking? Authorizing Provider  acetaminophen  (TYLENOL ) 650 MG CR tablet Take 650 mg by mouth every 8 (eight) hours as needed for pain.    [provider]  albuterol  (PROVENTIL ) (2.5 MG/3ML) 0.083% nebulizer solution Take 2.5 mg by nebulization every 4 (four) hours as needed for wheezing or shortness of breath. 01/26/18   [provider]  aspirin  81 MG EC tablet Take 81 mg by mouth daily.    [provider]  BD PEN NEEDLE NANO 2ND GEN 32G X 4 MM MISC 3 (three) times daily. 04/20/23   [provider]  carvedilol  (COREG ) 3.125 MG tablet Take 1 tablet (3.125 mg total) by mouth 2 (two) times daily with a meal. 09/01/24   Tobb, Kardie, DO  clopidogrel  (PLAVIX ) 75 MG tablet Take 1 tablet (75 mg total) by mouth daily. TAKE 1 TABLET(75 MG) BY MOUTH DAILY Patient not taking: Reported on 12/12/2024 07/07/24   Lesia Ozell Barter, PA-C  Continuous Glucose Sensor (DEXCOM G7 SENSOR) MISC SMARTSIG:1 Topical Every 10 Days  03/12/23   [provider]  cyclobenzaprine  (FLEXERIL ) 5 MG tablet Take 5 mg by mouth 3 (three) times daily as needed. 06/23/24   [provider]  dapagliflozin  propanediol (FARXIGA ) 10 MG TABS tablet TAKE 1 TABLET(10 MG) BY MOUTH DAILY BEFORE BREAKFAST Patient not taking: Reported on 12/13/2024 07/01/23   Tobb, Kardie, DO  dicyclomine  (BENTYL ) 10 MG capsule TAKE 1 CAPSULE(10 MG) BY MOUTH IN THE MORNING AND AT BEDTIME 11/16/24   Nandigam, Kavitha V, MD  digoxin   (LANOXIN ) 0.125 MG tablet TAKE ONE-HALF TABLET BY MOUTH EVERY DAY 06/23/24   Pasqualino Witherspoon S, MD  ezetimibe  (ZETIA ) 10 MG tablet Take 1 tablet (10 mg total) by mouth daily. 05/25/24 09/01/25  Rolan Ezra RAMAN, MD  famotidine  (PEPCID ) 40 MG tablet TAKE 1 TABLET(40 MG) BY MOUTH TWICE DAILY 02/06/23   Beather Delon Gibson, PA  folic acid  (FOLVITE ) 1 MG tablet Take 1 mg by mouth daily. 05/29/21   [provider]  insulin  lispro (HUMALOG) 100 UNIT/ML KwikPen Inject 2-20 Units into the skin 3 (three) times daily with meals.  per sliding scale 09/22/16   [provider]  isosorbide  mononitrate (IMDUR ) 30 MG 24 hr tablet Take 15 mg by mouth daily. 05/30/24   [provider]  lidocaine  (LIDODERM ) 5 % Place 1 patch onto the skin daily. Remove & Discard patch within 12 hours or as directed by MD 07/16/21   Tobb, Kardie, DO  loratadine  (CLARITIN ) 10 MG tablet Take 10 mg by mouth daily. 09/19/20   [provider]  losartan  (COZAAR ) 25 MG tablet Take 0.5 tablets (12.5 mg total) by mouth daily. 05/04/24 09/01/25  Glena Harlene HERO, FNP  nitroGLYCERIN  (NITROSTAT ) 0.4 MG SL tablet DISSOLVE 1 TABLET UNDER TONGUE EVERY 5 MINUTES AS NEEDED FOR CHEST PAIN. 08/24/23   Tobb, Kardie, DO  OZEMPIC, 0.25 OR 0.5 MG/DOSE, 2 MG/3ML SOPN Inject 0.5 mg into the skin every Monday. 10/28/23   [provider]  pantoprazole  (PROTONIX ) 40 MG tablet Take 1 tablet (40 mg total) by mouth daily. TAKE 1 TABLET(40 MG) BY MOUTH DAILY 07/07/24   Lesia Ozell Barter, PA-C  Plecanatide  (TRULANCE ) 3 MG TABS Take 1 tablet (3 mg total) by mouth as needed. 11/09/24   Beather Delon Gibson, PA  potassium chloride  SA (KLOR-CON  M) 20 MEQ tablet Take 1 tablet (20 mEq total) by mouth daily. 12/13/24   Milford, Harlene HERO, FNP  ranolazine  (RANEXA ) 500 MG 12 hr tablet Take 1 tablet (500 mg total) by mouth 2 (two) times daily. 05/11/24   Tobb, Kardie, DO  rosuvastatin  (CRESTOR ) 40 MG tablet Take 40 mg by mouth at bedtime.     [provider]  spironolactone  (ALDACTONE ) 25 MG tablet Take 1 tablet (25 mg total) by mouth daily. 09/01/24   Rolan Ezra RAMAN, MD  torsemide  (DEMADEX ) 20 MG tablet Take 1 tablet (20 mg total) by mouth 2 (two) times daily. 12/13/24   Milford, Harlene HERO, FNP  TRESIBA FLEXTOUCH 200 UNIT/ML FlexTouch Pen Inject 35 Units into the skin 2 (two) times daily. 06/29/20   [provider]  TRINTELLIX  5 MG TABS tablet Take 5 mg by mouth at bedtime. 08/14/20   [provider]  Ubrogepant  50 MG TABS Take 50 mg by mouth daily as needed (Headache). 04/07/23   [provider]  valACYclovir  (VALTREX ) 500 MG tablet Take 500 mg by mouth daily. 04/10/23   [provider]  vitamin B-12 (CYANOCOBALAMIN ) 1000 MCG tablet Take 1,000 mcg by mouth daily.  [provider]  Vitamin D , Ergocalciferol , (DRISDOL ) 1.25 MG (50000 UNIT) CAPS capsule Take 50,000 Units by mouth every 7 (seven) days.    [provider]    Past Medical History: Past Medical History:  Diagnosis Date   AICD (automatic cardioverter/defibrillator) present    Anemia 12/12/2019   Anginal pain    Arthralgia 01/14/2020   Arthritis    Asthma    Atherosclerotic heart disease of native coronary artery without angina pectoris 04/13/2014   CHF (congestive heart failure) (HCC)    Chronic bilateral low back pain with bilateral sciatica 01/13/2019   Chronic bladder pain 11/08/2014   Last Assessment & Plan:  Formatting of this note might be different from the original. Patient has chronic pain syndrome in general I think this is unfortunately worsening her pelvic pain and bladder pain her examination today was very reassuring I am advised her to use the estrogen cream I did start her on Elavil 10 milligrams at that time if she does tolerated will increase it to 25 milligrams.    Chronic headaches    Chronic idiopathic constipation 12/22/2014   Formatting of this note might be different from the  original. Last Assessment & Plan:  For better bowel emptying please use either Citrucel / Benefiber start with 2 tablespoons daily and titrate up or down to effect.  Also please use glycerin  suppositories as needed to assist in evacuation. Last Assessment & Plan:  Formatting of this note might be different from the original. For better bowel empt   Chronic obstructive pulmonary disease, unspecified (HCC) 03/18/2018   CKD (chronic kidney disease) stage 3, GFR 30-59 ml/min (HCC)    Class 1 obesity due to excess calories with serious comorbidity and body mass index (BMI) of 31.0 to 31.9 in adult 06/26/2020   Colon polyps    COPD (chronic obstructive pulmonary disease) (HCC)    Coronary artery disease    DDD (degenerative disc disease), cervical 01/13/2019   Depression 11/05/2019   Diabetic polyneuropathy associated with type 2 diabetes mellitus (HCC) 02/24/2019   Diverticulitis 05/20/2018   Diverticulitis of colon 03/18/2018   Family history of ischemic heart disease (IHD) 08/16/2013   Fibromyalgia    Fibromyalgia affecting multiple sites 01/14/2020   Gastro-esophageal reflux disease without esophagitis 01/13/2019   Glaucoma 11/08/2014   Hordeolum externum of left upper eyelid 03/13/2020   Hypertension 11/08/2014   IBS (irritable bowel syndrome)    Iron deficiency anemia 01/13/2019   Left renal mass 11/08/2019   Leukocytosis 05/28/2018   Low vitamin B12 level 03/13/2020   Low vitamin D  level 12/12/2019   LV dysfunction 05/31/2019   Malignant essential hypertension 01/22/2016   Migraine, unspecified, not intractable, without status migrainosus 11/08/2014   Mixed hyperlipidemia 01/13/2019   Myocardial infarction (HCC)    Other premature beats 11/08/2014   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 Last Assessment & Plan:  For vaginal atrophy please place a pea size dab of Estrogen vaginal cream  into the vagina 3 times a week ( Monday, Wednesday, Friday) Last Assessment & Plan:  Formatting of this note might be different from the  original. For vaginal atrophy please place a pea size dab of Estrogen vaginal cream  into the vagina     Past Surgical History: Past Surgical History:  Procedure Laterality Date   ABDOMINAL HYSTERECTOMY     BIV ICD INSERTION CRT-D N/A 12/31/2020   Procedure: BIV ICD INSERTION CRT-D;  Surgeon: Waddell Danelle ORN, MD;  Location: [facility] INVASIVE CV LAB;  Service: Cardiovascular;  Laterality: N/A;   BLEPHAROPLASTY Bilateral    COLON SURGERY     CORONARY ANGIOPLASTY WITH STENT PLACEMENT  2020   X3   CORONARY PRESSURE/FFR STUDY N/A 08/03/2020   Procedure: INTRAVASCULAR PRESSURE WIRE/FFR STUDY;  Surgeon: Wonda Sharper, MD;  Location: [facility] INVASIVE CV LAB;  Service: Cardiovascular;  Laterality: N/A;   CORONARY STENT INTERVENTION N/A 08/03/2020   Procedure: CORONARY STENT INTERVENTION;  Surgeon: Wonda Sharper, MD;  Location: [facility] INVASIVE CV LAB;  Service: Cardiovascular;  Laterality: N/A;   LEFT HEART CATH AND CORONARY ANGIOGRAPHY N/A 08/03/2020   Procedure: LEFT HEART CATH AND CORONARY ANGIOGRAPHY;  Surgeon: Wonda Sharper, MD;  Location: [facility] INVASIVE CV LAB;  Service: Cardiovascular;  Laterality: N/A;   RADIOFREQUENCY ABLATION  07/2023   REFRACTIVE SURGERY Left    [place]    RIGHT/LEFT HEART CATH AND CORONARY ANGIOGRAPHY N/A 10/13/2023   Procedure: RIGHT/LEFT HEART CATH AND CORONARY ANGIOGRAPHY;  Surgeon: Rolan Ezra RAMAN, MD;  Location: [facility] INVASIVE CV LAB;  Service: Cardiovascular;  Laterality: N/A;    Family History: Family History  Problem Relation Age of Onset   Lung cancer Mother     Coronary artery disease Mother    Diabetes Mother    Glaucoma Mother    Hypertension Mother    Colon polyps Mother    Migraines Father    Heart disease Father    Diabetes Maternal Grandmother    Diabetes Daughter    Migraines Son    Hypertension Brother     Social History: Social History   Socioeconomic History   Marital status: Significant Other    Spouse name: Not on file   Number of children: 3   Years of education: Not on file   Highest education level: Not on file  Occupational History   Occupation: retired  Tobacco Use   Smoking status: Former    Current packs/day: 0.00    Types: Cigarettes    Quit date: 2021    Years since quitting: 5.0   Smokeless tobacco: Never  Vaping Use   Vaping status: Never Used  Substance and Sexual Activity   Alcohol use: Never   Drug use: Never   Sexual activity: Not on file  Other Topics Concern   Not on file  Social History Narrative   Not on file   Social Drivers of Health   Tobacco Use: Medium Risk (12/14/2024)   Patient History    Smoking Tobacco Use: Former    Smokeless Tobacco Use: Never    Passive Exposure: Not on file  Financial Resource Strain: Low Risk (12/07/2024)   Received from [facility]   Overall Financial Resource Strain (CARDIA)    How hard is it for you to pay for the very basics like food, housing, medical care, and heating?: Not hard at all  Food Insecurity: No Food Insecurity (12/07/2024)   Received from [facility]   Epic    Within the past 12 months, you worried that your food would run out before you got the money to buy more.: Never true    Within the past 12 months, the  food you bought just didn't last and you didn't have money to get more.: Never true  Transportation Needs: No Transportation Needs (12/07/2024)   Received from [facility]    In the past 12 months, has lack of transportation kept you from medical appointments or from getting medications?: No    In the past 12 months,  has lack of transportation kept you from meetings, work, or from getting things needed for daily living?: No  Physical Activity: Not on file  Stress: Not on file  Social Connections: Not on file  Depression (PHQ2-9): Low Risk (05/26/2024)   Depression (PHQ2-9)    PHQ-2 Score: 0  Alcohol Screen: Not on file  Housing: Low Risk (12/07/2024)   Received from [facility]    In the last 12 months, was there a time when you were not able to pay the mortgage or rent on time?: No    In the past 12 months, how many times have you moved where you were living?: 0    At any time in the past 12 months, were you homeless or living in a shelter (including now)?: No  Utilities: Not At Risk (12/07/2024)   Received from [facility]    In the past 12 months has the electric, gas, oil, or water company threatened to shut off services in your home?: No  Health Literacy: Not on file    Allergies:  Allergies[1]  Objective:   Vital Signs:   Temp:  [97.7 F (36.5 C)] 97.7 F (36.5 C) (01/14 0801) Pulse Rate:  [70-79] 77 (01/14 1200) Resp:  [14-19] 15 (01/14 1200) BP: (92-121)/(58-85) 119/63 (01/14 1200) SpO2:  [100 %] 100 % (01/14 1200)    Weight change: There were no vitals filed for this visit.  Intake/Output:  No intake or output data in the 24 hours ending 12/14/24 1208  Physical Exam  General:  fatigued appearing.   Cor: Regular rate & rhythm. No MRG, JVD mildly elevated  Lungs: diminished at bases otherwise clear Abdominal: + RUQ tenderness  Extremities: no edema   Telemetry   Appears regular but difficult to interpret d/t barostim artifact   EKG    Appears regular but difficult to interpret d/t barostim artifact   Labs   Basic Metabolic Panel: Recent Labs  Lab 12/13/24 1529 12/14/24 0826  NA 133* 131*  K 4.8 4.8  CL 97* 96*  CO2 26 24  GLUCOSE 247* 255*  BUN 19 25*  CREATININE 1.51* 1.93*  CALCIUM  10.0 9.5    Liver Function Tests: Recent Labs  Lab  12/14/24 0826  AST 17  ALT 11  ALKPHOS 64  BILITOT 0.5  PROT 7.4  ALBUMIN 4.1   Recent Labs  Lab 12/14/24 0826  LIPASE 23   No results for input(s): AMMONIA in the last 168 hours.  CBC: Recent Labs  Lab 12/14/24 0826  WBC 11.3*  HGB 12.2  HCT 37.0  MCV 96.1  PLT 266    Cardiac Enzymes: No results for input(s): CKTOTAL, CKMB, CKMBINDEX, TROPONINI in the last 168 hours.  BNP: BNP (last 3 results) Recent Labs    09/01/24 1457  BNP 169.4*    ProBNP (last 3 results) Recent Labs    12/13/24 1529 12/14/24 0826  PROBNP 1,342.0* 1,539.0*     CBG: No results for input(s): GLUCAP in the last 168 hours.  Coagulation Studies: No results for input(s): LABPROT, INR in the last 72 hours.  Imaging   CT ABDOMEN PELVIS WO CONTRAST Result Date: 12/14/2024 EXAM: CT ABDOMEN AND PELVIS WITHOUT CONTRAST 12/14/2024 10:57:00 AM TECHNIQUE: CT of the abdomen and pelvis was performed without the administration of intravenous contrast. Multiplanar reformatted images are provided for review. Automated exposure control, iterative reconstruction, and/or weight-based adjustment of the mA/kV was utilized to reduce the radiation dose to as low as reasonably achievable. COMPARISON: CT abdomen and pelvis 06/12/2001. CLINICAL HISTORY: Epigastric pain. The patient reports epigastric pain. FINDINGS: LOWER CHEST: No acute abnormality. LIVER: The liver is unremarkable. GALLBLADDER AND BILE DUCTS: High density material within the gallbladder suggests sludge. No radiopaque formed stones. No biliary ductal dilatation. No gallbladder inflammation. Common bile duct normal caliber. Drain glass normal. SPLEEN: No acute abnormality. PANCREAS: No acute abnormality. ADRENAL GLANDS: No acute abnormality. KIDNEYS, URETERS AND BLADDER: again Demonstrated several simple fluid attenuation cysts and high density cysts in the left kidney. 1 intermediate density cyst in the left kidney with Hounsfield  units equal 52 measures 19 mm on image 31 of series 2 and is not seen on comparison exam. Mixed density cyst in the right kidney additionally appear benign. Recommend MRI with and without contrast for characterization of the new left renal intermediate density lesion. additional bilateral Bosniak 1 and Bosniak 2 renal cysts some mineral change from comparison exam 2022. No stones in the kidneys or ureters. No hydronephrosis. No perinephric or periureteral stranding. Urinary bladder is unremarkable. GI AND BOWEL: Stomach demonstrates no acute abnormality. Anastomosis in the proximal rectum without nodularity or obstruction. There is no bowel obstruction. PERITONEUM AND RETROPERITONEUM: No ascites. No free air. VASCULATURE: Aorta is normal in caliber. LYMPH NODES: No abdominal lymphadenopathy. REPRODUCTIVE ORGANS: No acute abnormality. BONES AND SOFT TISSUES: No acute osseous abnormality. No focal soft tissue abnormality. IMPRESSION: 1. No explanation for epigastric pain. 2. Sludge within the gallbladder without acute cholecystitis. 3. New 1.9 cm intermediate density left renal lesion, not seen on the comparison exam, indeterminate; recommend renal protocol MRI with and without intravenous contrast for characterization. 4. Additional probable benign bilateral renal cysts. 5. These results will be called to the ordering clinician or representative by the radiologist assistant, and communication documented in the pacs or clario dashboard Electronically signed by: Norleen Boxer MD 12/14/2024 11:22 AM EST RP Workstation: [id number]   DG Chest 2 View Result Date: 12/14/2024 EXAM: 2 VIEW(S) XRAY OF THE CHEST 12/14/2024 08:26:45 AM COMPARISON: 08/27/2024. CLINICAL HISTORY: The patient presents with chest pain. FINDINGS: LINES, TUBES AND DEVICES: Right chest wall nerve stimulator device in place with lead extending into the right neck. Left pacemaker noted. Coronary artery stent noted. LUNGS AND PLEURA: No focal pulmonary  opacity. No pleural effusion. No pneumothorax. HEART AND MEDIASTINUM: Cardiomegaly. BONES AND SOFT TISSUES: No acute osseous abnormality. IMPRESSION: 1. No acute findings. Electronically signed by: Waddell Calk MD 12/14/2024 08:45 AM EST RP Workstation: [id number]    Medications:   Current Medications:   Infusions:    Assessment/Plan   1. Chest/Abdominal Pain - mixed characteristics, most c/w GI etiology with also musculoskeletal components. However given CAD history and previous LAD stenting and some symptoms reminiscent of previous angina, will plan LHC tomorrow to assesses stent patency. Hs trop trend/elevation not c/w MI   - Lipase, CT of A/P and RUQ US  unremarkable  - also concern for ? Low output HF contributing to symptoms, plan RHC tomorrow. Normal LA level reassuring  - If cath unrevealing, consider GI consult, ? EGD   2. Acute on Chronic Systolic Heart Failure  -  Ischemic cardiomyopathy.  [employer] CRT-D device.  Echo in 8/24 showed EF < 20%, moderate LV dilation, mild LVH, RV normal.  This is somewhat worse than priors. LHC/RHC in 11/24 showed low CI at 2.12, normal filling pressures, unchanged coronary anatomy with no severe stenoses. S/p barostimulation activation therapy device 2/25. Echo from [facility] showed EF 20-25%, paradoxical LV motion. Echo 12/25 showed EF<20%, normal RV. NYHA Class II-III. Mild volume overload on exam. Device interrogation yesterday showed activity level of only 0.7 hr/day. Concerned that she may be nearing end-stage and time to evaluate for advanced therapies.  - Give 60 mg IV Lasix  x 1 and follow response - with AKI will hold losartan  and spiro for now  - hold home coreg  for now given concern for potential low output state - Can give Jardiance .  - Check digoxin  level, can continue low dose if level not elevated.  - continue home Imdur  30 mg daily  - Plan [facility] tomorrow to assesses hemodynamics and LAD stent patency  - has CRT-D,  99% BiV pacing on device interrogation   3. AKI on CKD IIIb  - baseline SCr 1.5 - SCr 1.9 on admission  - monitor w/ diuresis  - hold home losartan  in anticipation of contrast load w/ cath  - RHC tomorrow to assess hemodynamics  - follow BMP   4. Renal Cyst - incidental findings on CT A/P - defer to primary team   5. CAD  - previous prox and mid LAD stenting - patent stents on last cath 11/24 w/ 40% stenosis inside mLAD stent - Trops this admit not c/w acute MI - plan repeat LHC tomorrow, pending renal fx - continue ASA 81 - continue Crestor  40  - continue Imdur  30 mg  - continue Ranexa  500 mg bid   6. Type 2 DM - management per primary team     Length of Stay: 0  Caffie Shed, PA-C  12/14/2024, 12:08 PM  Advanced Heart Failure Team Pager [phone] (M-F; 7a - 5p)   Please visit [email or web address]: For overnight coverage please call cardiology fellow first. If fellow not available call Shock/ECMO MD on call.  For ECMO / Mechanical Support (Impella, IABP, LVAD) issues call Shock / ECMO MD on call.   Patient seen with PA, I formulated the plan and agree with the above note.   History as noted above.  She came to the ER with chest and upper abdominal pain and tightness, it has been present most of the time for about 3 days.  Not really responsive to NTG.  She has dyspnea with moderate exertion. TnT 25=>25.  Pro-BNP 1539. Creatinine mildly higher at 1.3.   CT abd/pelvis and abdominal US  showed some gallbladder sludging but nothing suggestive of acute cholecystitis.   General: NAD Neck: JVP 8 -9 cm, no thyromegaly or thyroid nodule.  Lungs: Clear to auscultation bilaterally with normal respiratory effort. CV: Nondisplaced PMI.  Heart regular S1/S2, no S3/S4, no murmur.  No peripheral edema.  No carotid bruit.  Normal pedal pulses.  Abdomen: Soft, tenderness epigastric area and also central chest, no hepatosplenomegaly, no distention.  Skin: Intact without lesions or rashes.   Neurologic: Alert and oriented x 3.  Psych: Normal affect. Extremities: No clubbing or cyanosis.  HEENT: Normal.   1. Acute on chronic systolic CHF: Ischemic cardiomyopathy.  [employer] CRT-D device.  Echo in 8/24 showed EF < 20%, moderate LV dilation, mild LVH, RV normal.  This is somewhat worse than priors. LHC/RHC in  11/24 showed low CI at 2.12, normal filling pressures, unchanged coronary anatomy with no severe stenoses. S/p barostimulation activation therapy device 2/25. Echo from [facility] showed EF 20-25%, paradoxical LV motion. Echo 12/25 showed EF<20%, normal RV. NYHA class III symptoms, I think she is mildly volume overloaded on exam and device check yesterday showed increased [employer] reading.  We have been concerned about possible low output HF, I wonder if this could be contributing to her GI symptoms.  - Lasix  60 mg IV x 1, she will have RHC tomorrow for assessment of filling pressures and cardiac output.  We discussed risks/benefits and she agrees to procedure.  - Hold spironolactone  and losartan  for now with elevated creatinine to 1.93 and plan for cath.  - Hold Coreg  for now with concern for low output.  - Continue digoxin  0.0625 mg daily if level is ok (check today).  - Continue Jardiance .  - I worry she will soon need advanced therapies (LVAD). Device interrogation shows she is sedentary. Most recent echo showed EF< 20%. RHC as above.   2.  CAD: PCI to mid/distal LAD in 2020 and proximal LAD in 2021.  LHC in 11/24 showed stable coronary anatomy with no severe stenoses. She reported to the ER with 2 days of atypical chest pain.  TnT minimally elevated with no trend, this is probably due to demand ischemia.  - If creatinine stable to lower tomorrow, will do coronary angiography along with RHC since we are considering her for possible advanced therapies.  Rule out worsening coronary disease. We discussed risks/benefits and she agrees to procedure.  - Continue  ranolazine  500 mg bid.  - Continue ASA/Plavix .  - Continue Crestor  + Zetia , goal LDL < 55. 3. COPD: History of smoking, quit 2021.   4. CKD 3: Follows with nephology at [facility]. Creatinine up to 1.9 today.  - Hold losartan  and spironolactone  until after cath.  - Can continue Jardiance .  5. Obesity: - Continue semaglutide. 6. PAD: Moderately decreased ABI on right in 7/25.  No claudication.  - She saw Dr. Serene, plan medical management.  7. GI: RUQ and epigastric pain.  No evidence by US  for acute cholecystitis.  GI symptoms could be a manifestation of CHF.   Ezra Shuck 12/14/2024 5:06 PM     [1]  Allergies Allergen Reactions   Bee Venom Anaphylaxis   Sumatriptan Shortness Of Breath    Migraine worsened   Amoxicillin-Pot Clavulanate Diarrhea and Nausea And Vomiting    GI Intolerance   Oxycodone Itching and Hives    Other reaction(s): Other (see comments) Funny feeling in head  off the edge    Buprenorphine Hcl     Other reaction(s): Itching   Clarithromycin Diarrhea    Abdominal pain   Duloxetine  Hcl Other (See Comments)    drowsiness   Hydrocodone Itching   Lactose     Upset stomach, gas/bloating    Liraglutide Other (See Comments)    Abdominal discomfort   Tramadol Hcl Itching   "

## 2024-12-14 NOTE — ED Provider Notes (Signed)
 " [place] [facility] Provider Note   CSN: [id number] Arrival date & time: 12/14/24  9244     Patient presents with: Chest Pain   Karen Warner is a 74 y.o. female.    Chest Pain  74 yo female with history of multiple STEMIs, CHF with reduce EF, DM presenting for chest pain x 2 days. Patient described chest tightness and epigastric burning with pain radiating to her right shoulder. Pt seen at CHF clinic yesterday with normal EKG and scheduled for outpatient stress test. This morning patients pained worsened to 9/10 without relief from nitroglycerin  and presented to ER.   Denies SOB, cough, fever, weakness.     Prior to Admission medications  Medication Sig Start Date End Date Taking? Authorizing Provider  acetaminophen  (TYLENOL ) 650 MG CR tablet Take 650 mg by mouth every 8 (eight) hours as needed for pain.    [provider]  albuterol  (PROVENTIL ) (2.5 MG/3ML) 0.083% nebulizer solution Take 2.5 mg by nebulization every 4 (four) hours as needed for wheezing or shortness of breath. 01/26/18   [provider]  aspirin  81 MG EC tablet Take 81 mg by mouth daily.    [provider]  BD PEN NEEDLE NANO 2ND GEN 32G X 4 MM MISC 3 (three) times daily. 04/20/23   [provider]  carvedilol  (COREG ) 3.125 MG tablet Take 1 tablet (3.125 mg total) by mouth 2 (two) times daily with a meal. 09/01/24   Tobb, Kardie, DO  clopidogrel  (PLAVIX ) 75 MG tablet Take 1 tablet (75 mg total) by mouth daily. TAKE 1 TABLET(75 MG) BY MOUTH DAILY Patient not taking: Reported on 12/12/2024 07/07/24   Lesia Ozell Barter, PA-C  Continuous Glucose Sensor (DEXCOM G7 SENSOR) MISC SMARTSIG:1 Topical Every 10 Days 03/12/23   [provider]  cyclobenzaprine  (FLEXERIL ) 5 MG tablet Take 5 mg by mouth 3 (three) times daily as needed. 06/23/24   [provider]  dapagliflozin  propanediol (FARXIGA ) 10 MG TABS tablet TAKE 1  TABLET(10 MG) BY MOUTH DAILY BEFORE BREAKFAST Patient not taking: Reported on 12/13/2024 07/01/23   Tobb, Kardie, DO  dicyclomine  (BENTYL ) 10 MG capsule TAKE 1 CAPSULE(10 MG) BY MOUTH IN THE MORNING AND AT BEDTIME 11/16/24   Nandigam, Kavitha V, MD  digoxin  (LANOXIN ) 0.125 MG tablet TAKE ONE-HALF TABLET BY MOUTH EVERY DAY 06/23/24   McLean, Dalton S, MD  ezetimibe  (ZETIA ) 10 MG tablet Take 1 tablet (10 mg total) by mouth daily. 05/25/24 09/01/25  Rolan Ezra RAMAN, MD  famotidine  (PEPCID ) 40 MG tablet TAKE 1 TABLET(40 MG) BY MOUTH TWICE DAILY 02/06/23   Beather Delon Gibson, PA  folic acid  (FOLVITE ) 1 MG tablet Take 1 mg by mouth daily. 05/29/21   [provider]  insulin  lispro (HUMALOG) 100 UNIT/ML KwikPen Inject 2-20 Units into the skin 3 (three) times daily with meals.  per sliding scale 09/22/16   [provider]  isosorbide  mononitrate (IMDUR ) 30 MG 24 hr tablet Take 15 mg by mouth daily. 05/30/24   [provider]  lidocaine  (LIDODERM ) 5 % Place 1 patch onto the skin daily. Remove & Discard patch within 12 hours or as directed by MD 07/16/21   Tobb, Kardie, DO  loratadine  (CLARITIN ) 10 MG tablet Take 10 mg by mouth daily. 09/19/20   [provider]  losartan  (COZAAR ) 25 MG tablet Take 0.5 tablets (12.5 mg total) by mouth daily. 05/04/24 09/01/25  Glena Harlene HERO, FNP  nitroGLYCERIN  (NITROSTAT ) 0.4 MG  SL tablet DISSOLVE 1 TABLET UNDER TONGUE EVERY 5 MINUTES AS NEEDED FOR CHEST PAIN. 08/24/23   Tobb, Kardie, DO  OZEMPIC, 0.25 OR 0.5 MG/DOSE, 2 MG/3ML SOPN Inject 0.5 mg into the skin every Monday. 10/28/23   [provider]  pantoprazole  (PROTONIX ) 40 MG tablet Take 1 tablet (40 mg total) by mouth daily. TAKE 1 TABLET(40 MG) BY MOUTH DAILY 07/07/24   Lesia Ozell Barter, PA-C  Plecanatide  (TRULANCE ) 3 MG TABS Take 1 tablet (3 mg total) by mouth as needed. 11/09/24   Beather Delon Gibson, PA  potassium chloride  SA (KLOR-CON  M) 20 MEQ tablet Take 1 tablet (20  mEq total) by mouth daily. 12/13/24   Milford, Harlene HERO, FNP  ranolazine  (RANEXA ) 500 MG 12 hr tablet Take 1 tablet (500 mg total) by mouth 2 (two) times daily. 05/11/24   Tobb, Kardie, DO  rosuvastatin  (CRESTOR ) 40 MG tablet Take 40 mg by mouth at bedtime.    [provider]  spironolactone  (ALDACTONE ) 25 MG tablet Take 1 tablet (25 mg total) by mouth daily. 09/01/24   Rolan Ezra RAMAN, MD  torsemide  (DEMADEX ) 20 MG tablet Take 1 tablet (20 mg total) by mouth 2 (two) times daily. 12/13/24   Milford, Harlene HERO, FNP  TRESIBA FLEXTOUCH 200 UNIT/ML FlexTouch Pen Inject 35 Units into the skin 2 (two) times daily. 06/29/20   [provider]  TRINTELLIX  5 MG TABS tablet Take 5 mg by mouth at bedtime. 08/14/20   [provider]  Ubrogepant  50 MG TABS Take 50 mg by mouth daily as needed (Headache). 04/07/23   [provider]  valACYclovir  (VALTREX ) 500 MG tablet Take 500 mg by mouth daily. 04/10/23   [provider]  vitamin B-12 (CYANOCOBALAMIN ) 1000 MCG tablet Take 1,000 mcg by mouth daily.    [provider]  Vitamin D , Ergocalciferol , (DRISDOL ) 1.25 MG (50000 UNIT) CAPS capsule Take 50,000 Units by mouth every 7 (seven) days.    [provider]    Allergies: Bee venom, Sumatriptan, Amoxicillin-pot clavulanate, Oxycodone, Buprenorphine hcl, Clarithromycin, Duloxetine  hcl, Hydrocodone, Lactose, Liraglutide, and Tramadol hcl    Review of Systems  Cardiovascular:  Positive for chest pain.    Updated Vital Signs BP 113/64   Pulse 71   Temp 97.7 F (36.5 C) (Oral)   Resp 14   SpO2 100%   Physical Exam Vitals and nursing note reviewed.  Constitutional:      General: She is not in acute distress.    Appearance: She is well-developed.  HENT:     Head: Normocephalic and atraumatic.     Nose: Nose normal.     Mouth/Throat:     Mouth: Mucous membranes are moist.  Eyes:     General: No scleral icterus.    Conjunctiva/sclera: Conjunctivae  normal.  Cardiovascular:     Rate and Rhythm: Normal rate and regular rhythm.     Heart sounds: Normal heart sounds. No murmur heard. Pulmonary:     Effort: Pulmonary effort is normal. No tachypnea or respiratory distress.     Breath sounds: Normal breath sounds.  Chest:     Chest wall: No tenderness.  Abdominal:     Palpations: Abdomen is soft.     Tenderness: There is abdominal tenderness.  Musculoskeletal:        General: No swelling.     Cervical back: Normal range of motion and neck supple.     Right lower leg: No edema.     Left lower leg: No  edema.  Skin:    General: Skin is warm and dry.     Capillary Refill: Capillary refill takes less than 2 seconds.  Neurological:     Mental Status: She is alert and oriented to person, place, and time.  Psychiatric:        Mood and Affect: Mood normal.        Behavior: Behavior normal.     (all labs ordered are listed, but only abnormal results are displayed) Labs Reviewed  CBC - Abnormal; Notable for the following components:      Result Value   WBC 11.3 (*)    RBC 3.85 (*)    All other components within normal limits  COMPREHENSIVE METABOLIC PANEL WITH GFR  LIPASE, BLOOD  PRO BRAIN NATRIURETIC PEPTIDE  TROPONIN T, HIGH SENSITIVITY    EKG: EKG Interpretation Date/Time:  Wednesday December 14 2024 08:15:00 EST Ventricular Rate:  70 PR Interval:  102 QRS Duration:  124 QT Interval:  438 QTC Calculation: 473 R Axis:   -26  Text Interpretation: Atrial-sensed ventricular-paced rhythm Abnormal ECG When compared with ECG of 13-Dec-2024 14:51, No significant change since last tracing Confirmed by Towana Sharper [phone]) on 12/14/2024 8:22:59 AM  Radiology: DG Chest 2 View Result Date: 12/14/2024 EXAM: 2 VIEW(S) XRAY OF THE CHEST 12/14/2024 08:26:45 AM COMPARISON: 08/27/2024. CLINICAL HISTORY: The patient presents with chest pain. FINDINGS: LINES, TUBES AND DEVICES: Right chest wall nerve stimulator device in place with lead  extending into the right neck. Left pacemaker noted. Coronary artery stent noted. LUNGS AND PLEURA: No focal pulmonary opacity. No pleural effusion. No pneumothorax. HEART AND MEDIASTINUM: Cardiomegaly. BONES AND SOFT TISSUES: No acute osseous abnormality. IMPRESSION: 1. No acute findings. Electronically signed by: Waddell Calk MD 12/14/2024 08:45 AM EST RP Workstation: [id number]     Procedures   Medications Ordered in the ED - No data to display  Clinical Course as of 12/14/24 1505  Wed Dec 14, 2024  3259 74 year old female with known coronary disease CHF here with ongoing chest pain.  Just saw cardiology yesterday for same and was set up for outpatient stress test.  Initial troponin 25.  Getting cardiology input. [MB]  1502 Internal medicine teaching service will admit. [WF]    Clinical Course User Index [MB] Towana Sharper BROCKS, MD [WF] Neldon Hamp RAMAN, [place]                                 Medical Decision Making Amount and/or Complexity of Data Reviewed Labs: ordered. Radiology: ordered. ECG/medicine tests: ordered.  Risk OTC drugs. Prescription drug management. Decision regarding hospitalization.   This patient presents to the ED for concern of CP, this involves a number of treatment options, and is a complaint that carries with it a moderate/high risk of complications and morbidity. A differential diagnosis was considered for the patient's symptoms which is discussed below:   Considered CAD, Dissection, pancreatitis, PUD, GERD.   Co morbidities: Discussed in HPI   Brief History:  74 yo female with history of CAD, CHF presenting for CP. Pt described pain as a tightness and burning around epigastric region radiating to right shoulder.    EMR reviewed including pt PMHx, past surgical history and past visits to ER.   See HPI for more details   Lab Tests:    Slight leukocytosis No anemias Elevated glucose Slight bump in creatinine (1.93) in comparison to past  visits (around 1.55) Lipase  negative Troponin 25 mildly elevated consistent with CHF BNP mildly elevated at 1539 in comparison to yesterday (1342)  Imaging Studies:  NAD. I personally reviewed all imaging studies and no acute abnormality found. I agree with radiology interpretation. Some GB sludge -- negative murphy sign.    Cardiac Monitoring:  The patient was maintained on a cardiac monitor.  I personally viewed and interpreted the cardiac monitored which showed an underlying rhythm of: paced rhythm       Nonischemic EKG   Medicines ordered:  I ordered medication including protonix  and maalox for possible GERD. Reevaluation of the patient after these medicines showed that the patient stayed the same I have reviewed the patients home medicines and have made adjustments as needed   Critical Interventions:    Consults/Attending Physician   I discussed this case with my attending physician who cosigned this note including patient's presenting symptoms, physical exam, and planned diagnostics and interventions. Attending physician stated agreement with plan or made changes to plan which were implemented.   Reevaluation:  After the interventions noted above I re-evaluated patient and found that they have :stayed the same   Social Determinants of Health:    Problem List / ED Course:  Patient presented to ED for chest pain concerning for CAD vs GERD vs pancreatitis vs GB disease. EKG showed paced rhythm with no ST elevations. Troponins were mildly elevated consistent with hx of CHF. Lipase negative. Patient given protonix /maalox. Abd US  showed some sludge in the gallbladder. Hamp PA-C spoke with cardiology. Pt being admitted to medicine and scheduled for cardiac cath later today.   Dispostion:  After consideration of the diagnostic results and the patients response to treatment, I feel that the patent would benefit from admission.     Final diagnoses:  None    ED  Discharge Orders     None          Harold Tillman ONEIDA DEVONNA 12/14/24 1513    Towana Ozell BROCKS, MD 12/15/24 [phone]  "

## 2024-12-14 NOTE — ED Provider Triage Note (Signed)
 Emergency Medicine Provider Triage Evaluation Note  Amada Hallisey , a 74 y.o. female  was evaluated in triage.  Pt complains of sternal chest pain that radiates to her right shoulder that has been ongoing for 2 days she endorses some shortness of breath and some fatigue.  Has a history of STEMI's has multiple stents has a history of CHF with less than 20% EF last echo done a month ago.  Review of Systems  Positive: Chest pain Negative: Fever  Physical Exam  BP 110/74 (BP Location: Right Arm)   Pulse 70   Temp 97.7 F (36.5 C) (Oral)   Resp 19   SpO2 100%  Gen:   Awake, no distress   Resp:  Normal effort  MSK:   Moves extremities without difficulty  Other:  No LEE  Medical Decision Making  Medically screening exam initiated at 8:06 AM.  Appropriate orders placed.  [name] was informed that the remainder of the evaluation will be completed by another provider, this initial triage assessment does not replace that evaluation, and the importance of remaining in the ED until their evaluation is complete.  Labs, EKG, cxr   Neldon Hamp RAMAN, [place] 12/14/24 9192

## 2024-12-14 NOTE — H&P (View-Only) (Signed)
 "   Advanced Heart Failure Team Consult Note   Primary Physician: Silver Lamar LABOR, MD Cardiologist:  Dub Huntsman, DO AHF Cardiologist: Dr. Rolan  HPI:    Karen Warner is seen today for evaluation of a/c systolic heart failure at the request of Dr. Towana, Emergency Medicine.   Karen Warner is 74 y.o. with history of CAD and ischemic cardiomyopathy and COPD. Patient had PCI to mid/distal LAD in 2020, then had unstable angina in 9/21 with DES to 70% stenosis proximal LAD.  She has had a cardiomyopathy for a number of years, [employer] CRT-D device placed in 2022.  Last echo in 8/24 showed EF < 20%, moderate LV dilation, mild LVH, RV normal.  She additionally has a diagnosis of COPD, quit smoking in 2021. She lives in [place], originally from [place]  but moved here to be near family.    LHC/RHC in 11/24 showed patent LAD stents with distal edge dissection in the apical LAD (no change from prior films), normal filling pressures, CI low at 2.12.    S/p barostimulation activation therapy device implant 2/25.   Echo 12/25 showed EF <20%, RV normal.    Seen in [facility] yesterday and felt to be mildly volume overloaded on exam and device interrogation, HL Score 19, no VT, 99% BiV. She endorsed NYHA Class II-III symptoms. She was instructed to increase her torsemide  to 20 mg bid and return for outpatient f/u but ultimately came to the ED for evaluation for chest pain.   She has mixed CP features. Some components reminiscent of previous angina but also w/ features more c/w GI and musculoskeletal etiologies. She describes substernal chest pressure, feels like an elephant on her chest w/ radiation to rt neck and back. Worse w/ movement/exertion. Also w/ RUQ pain worse after meals. Also endorses chest wall pain, reproducible w/ palpation. Before coming to ED, she took 1 SL NGT that did not resolve the pain. Hs trop 25>>25. EKG interpretation difficult d/t barostim  artifact. CXR w/ no acute findings. Lipase normal. LFTs including Tbilli level normal. CT A/P shows sludge within the gallbladder without acute cholecystitis, also w/ incidental findings of bilateral renal cyst, left lesion new compared to prior study measuring 1.9 cm. RUQ US  also showed minimal amount of gallbladder sludge but no other acute abnormalities. Lactic acid 0.7. SCr at 1.93 (baseline ~1.5).     Home Medications Prior to Admission medications  Medication Sig Start Date End Date Taking? Authorizing Provider  acetaminophen  (TYLENOL ) 650 MG CR tablet Take 650 mg by mouth every 8 (eight) hours as needed for pain.    [provider]  albuterol  (PROVENTIL ) (2.5 MG/3ML) 0.083% nebulizer solution Take 2.5 mg by nebulization every 4 (four) hours as needed for wheezing or shortness of breath. 01/26/18   [provider]  aspirin  81 MG EC tablet Take 81 mg by mouth daily.    [provider]  BD PEN NEEDLE NANO 2ND GEN 32G X 4 MM MISC 3 (three) times daily. 04/20/23   [provider]  carvedilol  (COREG ) 3.125 MG tablet Take 1 tablet (3.125 mg total) by mouth 2 (two) times daily with a meal. 09/01/24   Tobb, Kardie, DO  clopidogrel  (PLAVIX ) 75 MG tablet Take 1 tablet (75 mg total) by mouth daily. TAKE 1 TABLET(75 MG) BY MOUTH DAILY Patient not taking: Reported on 12/12/2024 07/07/24   Lesia Ozell Barter, PA-C  Continuous Glucose Sensor (DEXCOM G7 SENSOR) MISC SMARTSIG:1 Topical Every 10 Days  03/12/23   [provider]  cyclobenzaprine  (FLEXERIL ) 5 MG tablet Take 5 mg by mouth 3 (three) times daily as needed. 06/23/24   [provider]  dapagliflozin  propanediol (FARXIGA ) 10 MG TABS tablet TAKE 1 TABLET(10 MG) BY MOUTH DAILY BEFORE BREAKFAST Patient not taking: Reported on 12/13/2024 07/01/23   Tobb, Kardie, DO  dicyclomine  (BENTYL ) 10 MG capsule TAKE 1 CAPSULE(10 MG) BY MOUTH IN THE MORNING AND AT BEDTIME 11/16/24   Nandigam, Kavitha V, MD  digoxin   (LANOXIN ) 0.125 MG tablet TAKE ONE-HALF TABLET BY MOUTH EVERY DAY 06/23/24   Pasqualino Witherspoon S, MD  ezetimibe  (ZETIA ) 10 MG tablet Take 1 tablet (10 mg total) by mouth daily. 05/25/24 09/01/25  Rolan Ezra RAMAN, MD  famotidine  (PEPCID ) 40 MG tablet TAKE 1 TABLET(40 MG) BY MOUTH TWICE DAILY 02/06/23   Beather Delon Gibson, PA  folic acid  (FOLVITE ) 1 MG tablet Take 1 mg by mouth daily. 05/29/21   [provider]  insulin  lispro (HUMALOG) 100 UNIT/ML KwikPen Inject 2-20 Units into the skin 3 (three) times daily with meals.  per sliding scale 09/22/16   [provider]  isosorbide  mononitrate (IMDUR ) 30 MG 24 hr tablet Take 15 mg by mouth daily. 05/30/24   [provider]  lidocaine  (LIDODERM ) 5 % Place 1 patch onto the skin daily. Remove & Discard patch within 12 hours or as directed by MD 07/16/21   Tobb, Kardie, DO  loratadine  (CLARITIN ) 10 MG tablet Take 10 mg by mouth daily. 09/19/20   [provider]  losartan  (COZAAR ) 25 MG tablet Take 0.5 tablets (12.5 mg total) by mouth daily. 05/04/24 09/01/25  Glena Harlene HERO, FNP  nitroGLYCERIN  (NITROSTAT ) 0.4 MG SL tablet DISSOLVE 1 TABLET UNDER TONGUE EVERY 5 MINUTES AS NEEDED FOR CHEST PAIN. 08/24/23   Tobb, Kardie, DO  OZEMPIC, 0.25 OR 0.5 MG/DOSE, 2 MG/3ML SOPN Inject 0.5 mg into the skin every Monday. 10/28/23   [provider]  pantoprazole  (PROTONIX ) 40 MG tablet Take 1 tablet (40 mg total) by mouth daily. TAKE 1 TABLET(40 MG) BY MOUTH DAILY 07/07/24   Lesia Ozell Barter, PA-C  Plecanatide  (TRULANCE ) 3 MG TABS Take 1 tablet (3 mg total) by mouth as needed. 11/09/24   Beather Delon Gibson, PA  potassium chloride  SA (KLOR-CON  M) 20 MEQ tablet Take 1 tablet (20 mEq total) by mouth daily. 12/13/24   Milford, Harlene HERO, FNP  ranolazine  (RANEXA ) 500 MG 12 hr tablet Take 1 tablet (500 mg total) by mouth 2 (two) times daily. 05/11/24   Tobb, Kardie, DO  rosuvastatin  (CRESTOR ) 40 MG tablet Take 40 mg by mouth at bedtime.     [provider]  spironolactone  (ALDACTONE ) 25 MG tablet Take 1 tablet (25 mg total) by mouth daily. 09/01/24   Rolan Ezra RAMAN, MD  torsemide  (DEMADEX ) 20 MG tablet Take 1 tablet (20 mg total) by mouth 2 (two) times daily. 12/13/24   Milford, Harlene HERO, FNP  TRESIBA FLEXTOUCH 200 UNIT/ML FlexTouch Pen Inject 35 Units into the skin 2 (two) times daily. 06/29/20   [provider]  TRINTELLIX  5 MG TABS tablet Take 5 mg by mouth at bedtime. 08/14/20   [provider]  Ubrogepant  50 MG TABS Take 50 mg by mouth daily as needed (Headache). 04/07/23   [provider]  valACYclovir  (VALTREX ) 500 MG tablet Take 500 mg by mouth daily. 04/10/23   [provider]  vitamin B-12 (CYANOCOBALAMIN ) 1000 MCG tablet Take 1,000 mcg by mouth daily.  [provider]  Vitamin D , Ergocalciferol , (DRISDOL ) 1.25 MG (50000 UNIT) CAPS capsule Take 50,000 Units by mouth every 7 (seven) days.    [provider]    Past Medical History: Past Medical History:  Diagnosis Date   AICD (automatic cardioverter/defibrillator) present    Anemia 12/12/2019   Anginal pain    Arthralgia 01/14/2020   Arthritis    Asthma    Atherosclerotic heart disease of native coronary artery without angina pectoris 04/13/2014   CHF (congestive heart failure) (HCC)    Chronic bilateral low back pain with bilateral sciatica 01/13/2019   Chronic bladder pain 11/08/2014   Last Assessment & Plan:  Formatting of this note might be different from the original. Patient has chronic pain syndrome in general I think this is unfortunately worsening her pelvic pain and bladder pain her examination today was very reassuring I am advised her to use the estrogen cream I did start her on Elavil 10 milligrams at that time if she does tolerated will increase it to 25 milligrams.    Chronic headaches    Chronic idiopathic constipation 12/22/2014   Formatting of this note might be different from the  original. Last Assessment & Plan:  For better bowel emptying please use either Citrucel / Benefiber start with 2 tablespoons daily and titrate up or down to effect.  Also please use glycerin  suppositories as needed to assist in evacuation. Last Assessment & Plan:  Formatting of this note might be different from the original. For better bowel empt   Chronic obstructive pulmonary disease, unspecified (HCC) 03/18/2018   CKD (chronic kidney disease) stage 3, GFR 30-59 ml/min (HCC)    Class 1 obesity due to excess calories with serious comorbidity and body mass index (BMI) of 31.0 to 31.9 in adult 06/26/2020   Colon polyps    COPD (chronic obstructive pulmonary disease) (HCC)    Coronary artery disease    DDD (degenerative disc disease), cervical 01/13/2019   Depression 11/05/2019   Diabetic polyneuropathy associated with type 2 diabetes mellitus (HCC) 02/24/2019   Diverticulitis 05/20/2018   Diverticulitis of colon 03/18/2018   Family history of ischemic heart disease (IHD) 08/16/2013   Fibromyalgia    Fibromyalgia affecting multiple sites 01/14/2020   Gastro-esophageal reflux disease without esophagitis 01/13/2019   Glaucoma 11/08/2014   Hordeolum externum of left upper eyelid 03/13/2020   Hypertension 11/08/2014   IBS (irritable bowel syndrome)    Iron deficiency anemia 01/13/2019   Left renal mass 11/08/2019   Leukocytosis 05/28/2018   Low vitamin B12 level 03/13/2020   Low vitamin D  level 12/12/2019   LV dysfunction 05/31/2019   Malignant essential hypertension 01/22/2016   Migraine, unspecified, not intractable, without status migrainosus 11/08/2014   Mixed hyperlipidemia 01/13/2019   Myocardial infarction (HCC)    Other premature beats 11/08/2014   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 Last Assessment & Plan:  For vaginal atrophy please place a pea size dab of Estrogen vaginal cream  into the vagina 3 times a week ( Monday, Wednesday, Friday) Last Assessment & Plan:  Formatting of this note might be different from the  original. For vaginal atrophy please place a pea size dab of Estrogen vaginal cream  into the vagina     Past Surgical History: Past Surgical History:  Procedure Laterality Date   ABDOMINAL HYSTERECTOMY     BIV ICD INSERTION CRT-D N/A 12/31/2020   Procedure: BIV ICD INSERTION CRT-D;  Surgeon: Waddell Danelle ORN, MD;  Location: [facility] INVASIVE CV LAB;  Service: Cardiovascular;  Laterality: N/A;   BLEPHAROPLASTY Bilateral    COLON SURGERY     CORONARY ANGIOPLASTY WITH STENT PLACEMENT  2020   X3   CORONARY PRESSURE/FFR STUDY N/A 08/03/2020   Procedure: INTRAVASCULAR PRESSURE WIRE/FFR STUDY;  Surgeon: Wonda Sharper, MD;  Location: [facility] INVASIVE CV LAB;  Service: Cardiovascular;  Laterality: N/A;   CORONARY STENT INTERVENTION N/A 08/03/2020   Procedure: CORONARY STENT INTERVENTION;  Surgeon: Wonda Sharper, MD;  Location: [facility] INVASIVE CV LAB;  Service: Cardiovascular;  Laterality: N/A;   LEFT HEART CATH AND CORONARY ANGIOGRAPHY N/A 08/03/2020   Procedure: LEFT HEART CATH AND CORONARY ANGIOGRAPHY;  Surgeon: Wonda Sharper, MD;  Location: [facility] INVASIVE CV LAB;  Service: Cardiovascular;  Laterality: N/A;   RADIOFREQUENCY ABLATION  07/2023   REFRACTIVE SURGERY Left    [place]    RIGHT/LEFT HEART CATH AND CORONARY ANGIOGRAPHY N/A 10/13/2023   Procedure: RIGHT/LEFT HEART CATH AND CORONARY ANGIOGRAPHY;  Surgeon: Rolan Ezra RAMAN, MD;  Location: [facility] INVASIVE CV LAB;  Service: Cardiovascular;  Laterality: N/A;    Family History: Family History  Problem Relation Age of Onset   Lung cancer Mother     Coronary artery disease Mother    Diabetes Mother    Glaucoma Mother    Hypertension Mother    Colon polyps Mother    Migraines Father    Heart disease Father    Diabetes Maternal Grandmother    Diabetes Daughter    Migraines Son    Hypertension Brother     Social History: Social History   Socioeconomic History   Marital status: Significant Other    Spouse name: Not on file   Number of children: 3   Years of education: Not on file   Highest education level: Not on file  Occupational History   Occupation: retired  Tobacco Use   Smoking status: Former    Current packs/day: 0.00    Types: Cigarettes    Quit date: 2021    Years since quitting: 5.0   Smokeless tobacco: Never  Vaping Use   Vaping status: Never Used  Substance and Sexual Activity   Alcohol use: Never   Drug use: Never   Sexual activity: Not on file  Other Topics Concern   Not on file  Social History Narrative   Not on file   Social Drivers of Health   Tobacco Use: Medium Risk (12/14/2024)   Patient History    Smoking Tobacco Use: Former    Smokeless Tobacco Use: Never    Passive Exposure: Not on file  Financial Resource Strain: Low Risk (12/07/2024)   Received from [facility]   Overall Financial Resource Strain (CARDIA)    How hard is it for you to pay for the very basics like food, housing, medical care, and heating?: Not hard at all  Food Insecurity: No Food Insecurity (12/07/2024)   Received from [facility]   Epic    Within the past 12 months, you worried that your food would run out before you got the money to buy more.: Never true    Within the past 12 months, the  food you bought just didn't last and you didn't have money to get more.: Never true  Transportation Needs: No Transportation Needs (12/07/2024)   Received from [facility]    In the past 12 months, has lack of transportation kept you from medical appointments or from getting medications?: No    In the past 12 months,  has lack of transportation kept you from meetings, work, or from getting things needed for daily living?: No  Physical Activity: Not on file  Stress: Not on file  Social Connections: Not on file  Depression (PHQ2-9): Low Risk (05/26/2024)   Depression (PHQ2-9)    PHQ-2 Score: 0  Alcohol Screen: Not on file  Housing: Low Risk (12/07/2024)   Received from [facility]    In the last 12 months, was there a time when you were not able to pay the mortgage or rent on time?: No    In the past 12 months, how many times have you moved where you were living?: 0    At any time in the past 12 months, were you homeless or living in a shelter (including now)?: No  Utilities: Not At Risk (12/07/2024)   Received from [facility]    In the past 12 months has the electric, gas, oil, or water company threatened to shut off services in your home?: No  Health Literacy: Not on file    Allergies:  Allergies[1]  Objective:   Vital Signs:   Temp:  [97.7 F (36.5 C)] 97.7 F (36.5 C) (01/14 0801) Pulse Rate:  [70-79] 77 (01/14 1200) Resp:  [14-19] 15 (01/14 1200) BP: (92-121)/(58-85) 119/63 (01/14 1200) SpO2:  [100 %] 100 % (01/14 1200)    Weight change: There were no vitals filed for this visit.  Intake/Output:  No intake or output data in the 24 hours ending 12/14/24 1208  Physical Exam  General:  fatigued appearing.   Cor: Regular rate & rhythm. No MRG, JVD mildly elevated  Lungs: diminished at bases otherwise clear Abdominal: + RUQ tenderness  Extremities: no edema   Telemetry   Appears regular but difficult to interpret d/t barostim artifact   EKG    Appears regular but difficult to interpret d/t barostim artifact   Labs   Basic Metabolic Panel: Recent Labs  Lab 12/13/24 1529 12/14/24 0826  NA 133* 131*  K 4.8 4.8  CL 97* 96*  CO2 26 24  GLUCOSE 247* 255*  BUN 19 25*  CREATININE 1.51* 1.93*  CALCIUM  10.0 9.5    Liver Function Tests: Recent Labs  Lab  12/14/24 0826  AST 17  ALT 11  ALKPHOS 64  BILITOT 0.5  PROT 7.4  ALBUMIN 4.1   Recent Labs  Lab 12/14/24 0826  LIPASE 23   No results for input(s): AMMONIA in the last 168 hours.  CBC: Recent Labs  Lab 12/14/24 0826  WBC 11.3*  HGB 12.2  HCT 37.0  MCV 96.1  PLT 266    Cardiac Enzymes: No results for input(s): CKTOTAL, CKMB, CKMBINDEX, TROPONINI in the last 168 hours.  BNP: BNP (last 3 results) Recent Labs    09/01/24 1457  BNP 169.4*    ProBNP (last 3 results) Recent Labs    12/13/24 1529 12/14/24 0826  PROBNP 1,342.0* 1,539.0*     CBG: No results for input(s): GLUCAP in the last 168 hours.  Coagulation Studies: No results for input(s): LABPROT, INR in the last 72 hours.  Imaging   CT ABDOMEN PELVIS WO CONTRAST Result Date: 12/14/2024 EXAM: CT ABDOMEN AND PELVIS WITHOUT CONTRAST 12/14/2024 10:57:00 AM TECHNIQUE: CT of the abdomen and pelvis was performed without the administration of intravenous contrast. Multiplanar reformatted images are provided for review. Automated exposure control, iterative reconstruction, and/or weight-based adjustment of the mA/kV was utilized to reduce the radiation dose to as low as reasonably achievable. COMPARISON: CT abdomen and pelvis 06/12/2001. CLINICAL HISTORY: Epigastric pain. The patient reports epigastric pain. FINDINGS: LOWER CHEST: No acute abnormality. LIVER: The liver is unremarkable. GALLBLADDER AND BILE DUCTS: High density material within the gallbladder suggests sludge. No radiopaque formed stones. No biliary ductal dilatation. No gallbladder inflammation. Common bile duct normal caliber. Drain glass normal. SPLEEN: No acute abnormality. PANCREAS: No acute abnormality. ADRENAL GLANDS: No acute abnormality. KIDNEYS, URETERS AND BLADDER: again Demonstrated several simple fluid attenuation cysts and high density cysts in the left kidney. 1 intermediate density cyst in the left kidney with Hounsfield  units equal 52 measures 19 mm on image 31 of series 2 and is not seen on comparison exam. Mixed density cyst in the right kidney additionally appear benign. Recommend MRI with and without contrast for characterization of the new left renal intermediate density lesion. additional bilateral Bosniak 1 and Bosniak 2 renal cysts some mineral change from comparison exam 2022. No stones in the kidneys or ureters. No hydronephrosis. No perinephric or periureteral stranding. Urinary bladder is unremarkable. GI AND BOWEL: Stomach demonstrates no acute abnormality. Anastomosis in the proximal rectum without nodularity or obstruction. There is no bowel obstruction. PERITONEUM AND RETROPERITONEUM: No ascites. No free air. VASCULATURE: Aorta is normal in caliber. LYMPH NODES: No abdominal lymphadenopathy. REPRODUCTIVE ORGANS: No acute abnormality. BONES AND SOFT TISSUES: No acute osseous abnormality. No focal soft tissue abnormality. IMPRESSION: 1. No explanation for epigastric pain. 2. Sludge within the gallbladder without acute cholecystitis. 3. New 1.9 cm intermediate density left renal lesion, not seen on the comparison exam, indeterminate; recommend renal protocol MRI with and without intravenous contrast for characterization. 4. Additional probable benign bilateral renal cysts. 5. These results will be called to the ordering clinician or representative by the radiologist assistant, and communication documented in the pacs or clario dashboard Electronically signed by: Norleen Boxer MD 12/14/2024 11:22 AM EST RP Workstation: [id number]   DG Chest 2 View Result Date: 12/14/2024 EXAM: 2 VIEW(S) XRAY OF THE CHEST 12/14/2024 08:26:45 AM COMPARISON: 08/27/2024. CLINICAL HISTORY: The patient presents with chest pain. FINDINGS: LINES, TUBES AND DEVICES: Right chest wall nerve stimulator device in place with lead extending into the right neck. Left pacemaker noted. Coronary artery stent noted. LUNGS AND PLEURA: No focal pulmonary  opacity. No pleural effusion. No pneumothorax. HEART AND MEDIASTINUM: Cardiomegaly. BONES AND SOFT TISSUES: No acute osseous abnormality. IMPRESSION: 1. No acute findings. Electronically signed by: Waddell Calk MD 12/14/2024 08:45 AM EST RP Workstation: [id number]    Medications:   Current Medications:   Infusions:    Assessment/Plan   1. Chest/Abdominal Pain - mixed characteristics, most c/w GI etiology with also musculoskeletal components. However given CAD history and previous LAD stenting and some symptoms reminiscent of previous angina, will plan LHC tomorrow to assesses stent patency. Hs trop trend/elevation not c/w MI   - Lipase, CT of A/P and RUQ US  unremarkable  - also concern for ? Low output HF contributing to symptoms, plan RHC tomorrow. Normal LA level reassuring  - If cath unrevealing, consider GI consult, ? EGD   2. Acute on Chronic Systolic Heart Failure  -  Ischemic cardiomyopathy.  [employer] CRT-D device.  Echo in 8/24 showed EF < 20%, moderate LV dilation, mild LVH, RV normal.  This is somewhat worse than priors. LHC/RHC in 11/24 showed low CI at 2.12, normal filling pressures, unchanged coronary anatomy with no severe stenoses. S/p barostimulation activation therapy device 2/25. Echo from [facility] showed EF 20-25%, paradoxical LV motion. Echo 12/25 showed EF<20%, normal RV. NYHA Class II-III. Mild volume overload on exam. Device interrogation yesterday showed activity level of only 0.7 hr/day. Concerned that she may be nearing end-stage and time to evaluate for advanced therapies.  - Give 60 mg IV Lasix  x 1 and follow response - with AKI will hold losartan  and spiro for now  - hold home coreg  for now given concern for potential low output state - Can give Jardiance .  - Check digoxin  level, can continue low dose if level not elevated.  - continue home Imdur  30 mg daily  - Plan [facility] tomorrow to assesses hemodynamics and LAD stent patency  - has CRT-D,  99% BiV pacing on device interrogation   3. AKI on CKD IIIb  - baseline SCr 1.5 - SCr 1.9 on admission  - monitor w/ diuresis  - hold home losartan  in anticipation of contrast load w/ cath  - RHC tomorrow to assess hemodynamics  - follow BMP   4. Renal Cyst - incidental findings on CT A/P - defer to primary team   5. CAD  - previous prox and mid LAD stenting - patent stents on last cath 11/24 w/ 40% stenosis inside mLAD stent - Trops this admit not c/w acute MI - plan repeat LHC tomorrow, pending renal fx - continue ASA 81 - continue Crestor  40  - continue Imdur  30 mg  - continue Ranexa  500 mg bid   6. Type 2 DM - management per primary team     Length of Stay: 0  Caffie Shed, PA-C  12/14/2024, 12:08 PM  Advanced Heart Failure Team Pager [phone] (M-F; 7a - 5p)   Please visit [email or web address]: For overnight coverage please call cardiology fellow first. If fellow not available call Shock/ECMO MD on call.  For ECMO / Mechanical Support (Impella, IABP, LVAD) issues call Shock / ECMO MD on call.   Patient seen with PA, I formulated the plan and agree with the above note.   History as noted above.  She came to the ER with chest and upper abdominal pain and tightness, it has been present most of the time for about 3 days.  Not really responsive to NTG.  She has dyspnea with moderate exertion. TnT 25=>25.  Pro-BNP 1539. Creatinine mildly higher at 1.3.   CT abd/pelvis and abdominal US  showed some gallbladder sludging but nothing suggestive of acute cholecystitis.   General: NAD Neck: JVP 8 -9 cm, no thyromegaly or thyroid nodule.  Lungs: Clear to auscultation bilaterally with normal respiratory effort. CV: Nondisplaced PMI.  Heart regular S1/S2, no S3/S4, no murmur.  No peripheral edema.  No carotid bruit.  Normal pedal pulses.  Abdomen: Soft, tenderness epigastric area and also central chest, no hepatosplenomegaly, no distention.  Skin: Intact without lesions or rashes.   Neurologic: Alert and oriented x 3.  Psych: Normal affect. Extremities: No clubbing or cyanosis.  HEENT: Normal.   1. Acute on chronic systolic CHF: Ischemic cardiomyopathy.  [employer] CRT-D device.  Echo in 8/24 showed EF < 20%, moderate LV dilation, mild LVH, RV normal.  This is somewhat worse than priors. LHC/RHC in  11/24 showed low CI at 2.12, normal filling pressures, unchanged coronary anatomy with no severe stenoses. S/p barostimulation activation therapy device 2/25. Echo from [facility] showed EF 20-25%, paradoxical LV motion. Echo 12/25 showed EF<20%, normal RV. NYHA class III symptoms, I think she is mildly volume overloaded on exam and device check yesterday showed increased [employer] reading.  We have been concerned about possible low output HF, I wonder if this could be contributing to her GI symptoms.  - Lasix  60 mg IV x 1, she will have RHC tomorrow for assessment of filling pressures and cardiac output.  We discussed risks/benefits and she agrees to procedure.  - Hold spironolactone  and losartan  for now with elevated creatinine to 1.93 and plan for cath.  - Hold Coreg  for now with concern for low output.  - Continue digoxin  0.0625 mg daily if level is ok (check today).  - Continue Jardiance .  - I worry she will soon need advanced therapies (LVAD). Device interrogation shows she is sedentary. Most recent echo showed EF< 20%. RHC as above.   2.  CAD: PCI to mid/distal LAD in 2020 and proximal LAD in 2021.  LHC in 11/24 showed stable coronary anatomy with no severe stenoses. She reported to the ER with 2 days of atypical chest pain.  TnT minimally elevated with no trend, this is probably due to demand ischemia.  - If creatinine stable to lower tomorrow, will do coronary angiography along with RHC since we are considering her for possible advanced therapies.  Rule out worsening coronary disease. We discussed risks/benefits and she agrees to procedure.  - Continue  ranolazine  500 mg bid.  - Continue ASA/Plavix .  - Continue Crestor  + Zetia , goal LDL < 55. 3. COPD: History of smoking, quit 2021.   4. CKD 3: Follows with nephology at [facility]. Creatinine up to 1.9 today.  - Hold losartan  and spironolactone  until after cath.  - Can continue Jardiance .  5. Obesity: - Continue semaglutide. 6. PAD: Moderately decreased ABI on right in 7/25.  No claudication.  - She saw Dr. Serene, plan medical management.  7. GI: RUQ and epigastric pain.  No evidence by US  for acute cholecystitis.  GI symptoms could be a manifestation of CHF.   Ezra Shuck 12/14/2024 5:06 PM     [1]  Allergies Allergen Reactions   Bee Venom Anaphylaxis   Sumatriptan Shortness Of Breath    Migraine worsened   Amoxicillin-Pot Clavulanate Diarrhea and Nausea And Vomiting    GI Intolerance   Oxycodone Itching and Hives    Other reaction(s): Other (see comments) Funny feeling in head  off the edge    Buprenorphine Hcl     Other reaction(s): Itching   Clarithromycin Diarrhea    Abdominal pain   Duloxetine  Hcl Other (See Comments)    drowsiness   Hydrocodone Itching   Lactose     Upset stomach, gas/bloating    Liraglutide Other (See Comments)    Abdominal discomfort   Tramadol Hcl Itching   "

## 2024-12-14 NOTE — Hospital Course (Addendum)
 Vitals: 116/52, afebrile, HR 76.   Patient complaining of chest pain that hurts to breath over the last 3 days located in the central chest and pain in RU chest wall down into shoulder blades and into right arm and hand.  Pain occurring now. Pain is constant. The right arm pain has been with her for the last 6 months. The pain in neck has been worsening with the pacemaker and this is causing arm pain to do worse. Had an epidural or spinal injection 3 weeks ago and patient stated she had an appointment with orthopedists for injection today before ablation but had to miss appointment. Additionally complaining of stomach pain and HA and patient is hungry.  Hard for patient to distinguish pain but feels like everything came on at one time over the last couple of days. Has history of chronic chest pain.   [facility], patient was scheduled for stress test to determine what she needs to do going forward ie heart transplant or heart pump. She was evaluated for chest pain yesterday and was told it was hiding fluid. Torsemide  was increased. Last night she was asleep and the pain woke her up. When she sat up it seemed to get worse. No alleviating positions within the bed that the patient could find. Most painful when laying flat. Has been feeling more fatigued since leaving doctors as well. Did not take nitroglycerin  last night. Took flexeril  and tylenol  which did not help with the pain last night   Patient has chronic constipation and patient is on several medications for this condition. Previously could not tolerate linezzess or other meds. The current medication she is on now is more tolerable. When she takes the medication however, it makes her stomach sore. No new worsening stomach pain per patient today.   Patient had shortness of breath which seems to have resolved. When she takes deep breaths it hurts. Prior to 3 days ago she was not coughing or having SOB.   Denies F, N, V,  Enodorses: bloated.    Lives in [place]. Lives with significant other and daughter. And son and wife live next door.  Retired.  Smoking history but quit in 2021 after first heart attack  Hx of alcohol use  No illicit drug use.  Doesn't use any mobility devices. Fully independent at home.   Full code. Temporary intubation.   Causing vibration up neck and head? At 8 and 9 vibration was there and now its on 4. Pacemaker for CHF.   Labs:  - proBNP 12/14/2024: 1,342 --> 12/15/2024: 1,539 - CBC: mild leukocytosis 11.3 - Troponin 25-->25 - Lipase 23 - CMP: BUN 25, Cr 1.93 (12/13/2024: 1.51), WNL AST/ALT, Total Bilirubin  Imaging:  - CXR: no acute findings. Cardiomegaly. No pleural effusion.  - CT Abdomen Pelvis: Sludge within GB. New 1.9 cm intermediate density left renal lesion, not in previous exam. Recommend renal protocol MRI with and without for characterization, bilateral benign renal cysts.  - RUQ US : minimal amount of sludge.  - 11/18/2024 Echo: LV EF <20%, No [facility] dilatation noted. No evidence of pericardial effusion.   EKG:  - no ST elevation or depression. Similar to previous.    Vauge Multiple stends, EF 20%, cards seeing, hospitalist admit as not slam dunk cp Chest pain x 3 days US  with sludging, no murphy  Meds:  - acetaminophen  PRN - albuterol  inhaler  - ASA 81 mg daily  - coreg  3.125 mg BID - Flexeril  5 mg TID PRN -  Farxiga  10 mg daily  - Bentyl  10 mg BID - Digoxin  0.125 mg (1/2 tab daily) - Zetia  10 mg daily  - Pepsid 40 mg daily  - folic acid  daily  - insulin  lispro 2-20 units TID  - Imdur  15 mg daily  - claritin  10 mg daily  - losartan  25 mg daily  - nitroglycerin  0.4 mg daily  - ozempic 0.5 mg weekly - protonix  40 mg daily  - plecanatide  3 mg PRN - ranexa  500 mg BID - rosuvostatin 40 mg bedtime  - spironolactone  25 mg daily  - torsemide  40 mg AM and 20 mg PM ** - tresiba 35 Units BID - Trintellix  5 mg at bedtime - Ubrogepant  50 mg daily PRN for HA  - Valtrex  500  mg daily  - b12 - vit D - Plavix  75 mg daily    - chest pain  - renal lesion  - chronic pain    Pain along ribs anterior and posterior. Achy, feeling a bit better. Pain in right arm has been constant. Worse with deep breath. Has had the hiccups for the last 2-3 months. Dr. Twyla her medication for hiccups and she did not take because it said it wasn't good for elderly or people with heart concerns. Morphine  was putting her to sleep and the muscle relaxer helped and tylenol . HA has since resolved. Quieted pain down. Tolerated PO intake well, as soon as she finished eating the pain started again. Pain mostly in upper stomach. No nausea or vomiting.   RUQ pain, epigastric pain. Euvolemic on exam. Pulses normal.   1/16: Today patient is sitting up in bed. She reports continued pain in her mid-sternal chest region as well as her abdomen. When asked if she has a good understanding about her overall condition, she states no, she wasn't aware yet in regards to future plans for her heart care yet. She does have family that is easily reachable by phone, but is unsure if any of them will come by in person today.  - scheduled Tylenol  - scheduled Dilaudid , per pharmacy due to renal dysfunction, morphine  not a good option - scheduled Mylanta   1/18 This morning she feels yucky that she attributes to a migraine last night and a dose of ubrelvy  that causes drowsiness for her. She also notes that she has not had a bowel movement yet and would like to go, but is not sure about trying a suppository/enema. Regarding her pain, her chest pain is improving only noting a little tightness. She has some abdominal tenderness near the location of injections. She was able to get on her feet this weekend.

## 2024-12-15 ENCOUNTER — Encounter (HOSPITAL_COMMUNITY): Payer: Self-pay | Admitting: Cardiology

## 2024-12-15 ENCOUNTER — Encounter (HOSPITAL_COMMUNITY): Admission: EM | Payer: Self-pay | Source: Home / Self Care

## 2024-12-15 ENCOUNTER — Other Ambulatory Visit: Payer: Self-pay

## 2024-12-15 DIAGNOSIS — I5043 Acute on chronic combined systolic (congestive) and diastolic (congestive) heart failure: Secondary | ICD-10-CM | POA: Diagnosis not present

## 2024-12-15 DIAGNOSIS — E1122 Type 2 diabetes mellitus with diabetic chronic kidney disease: Secondary | ICD-10-CM | POA: Diagnosis not present

## 2024-12-15 DIAGNOSIS — N179 Acute kidney failure, unspecified: Secondary | ICD-10-CM | POA: Diagnosis not present

## 2024-12-15 DIAGNOSIS — M549 Dorsalgia, unspecified: Secondary | ICD-10-CM

## 2024-12-15 DIAGNOSIS — F32A Depression, unspecified: Secondary | ICD-10-CM | POA: Diagnosis not present

## 2024-12-15 DIAGNOSIS — M542 Cervicalgia: Secondary | ICD-10-CM | POA: Diagnosis not present

## 2024-12-15 DIAGNOSIS — N183 Chronic kidney disease, stage 3 unspecified: Secondary | ICD-10-CM | POA: Diagnosis not present

## 2024-12-15 DIAGNOSIS — I2510 Atherosclerotic heart disease of native coronary artery without angina pectoris: Secondary | ICD-10-CM | POA: Diagnosis not present

## 2024-12-15 DIAGNOSIS — I5023 Acute on chronic systolic (congestive) heart failure: Secondary | ICD-10-CM

## 2024-12-15 DIAGNOSIS — J449 Chronic obstructive pulmonary disease, unspecified: Secondary | ICD-10-CM | POA: Diagnosis not present

## 2024-12-15 DIAGNOSIS — R1011 Right upper quadrant pain: Secondary | ICD-10-CM | POA: Diagnosis not present

## 2024-12-15 HISTORY — PX: RIGHT/LEFT HEART CATH AND CORONARY ANGIOGRAPHY: CATH118266

## 2024-12-15 LAB — GLUCOSE, CAPILLARY
Glucose-Capillary: 86 mg/dL (ref 70–99)
Glucose-Capillary: 158 mg/dL — ABNORMAL HIGH (ref 70–99)
Glucose-Capillary: 149 mg/dL — ABNORMAL HIGH (ref 70–99)

## 2024-12-15 LAB — CBG MONITORING, ED: Glucose-Capillary: 138 mg/dL — ABNORMAL HIGH (ref 70–99)

## 2024-12-15 LAB — BASIC METABOLIC PANEL WITH GFR
Anion gap: 11 (ref 5–15)
BUN: 26 mg/dL — ABNORMAL HIGH (ref 8–23)
CO2: 26 mmol/L (ref 22–32)
Calcium: 9.2 mg/dL (ref 8.9–10.3)
Chloride: 100 mmol/L (ref 98–111)
Creatinine, Ser: 1.92 mg/dL — ABNORMAL HIGH (ref 0.44–1.00)
GFR, Estimated: 27 mL/min — ABNORMAL LOW
Glucose, Bld: 147 mg/dL — ABNORMAL HIGH (ref 70–99)
Potassium: 4.4 mmol/L (ref 3.5–5.1)
Sodium: 137 mmol/L (ref 135–145)

## 2024-12-15 LAB — POCT I-STAT EG7
Acid-Base Excess: 2 mmol/L (ref 0.0–2.0)
Acid-Base Excess: 2 mmol/L (ref 0.0–2.0)
Bicarbonate: 26.9 mmol/L (ref 20.0–28.0)
Bicarbonate: 27.1 mmol/L (ref 20.0–28.0)
Calcium, Ion: 1.2 mmol/L (ref 1.15–1.40)
Calcium, Ion: 1.22 mmol/L (ref 1.15–1.40)
HCT: 38 % (ref 36.0–46.0)
HCT: 39 % (ref 36.0–46.0)
Hemoglobin: 12.9 g/dL (ref 12.0–15.0)
Hemoglobin: 13.3 g/dL (ref 12.0–15.0)
O2 Saturation: 59 %
O2 Saturation: 60 %
Potassium: 3.9 mmol/L (ref 3.5–5.1)
Potassium: 4 mmol/L (ref 3.5–5.1)
Sodium: 139 mmol/L (ref 135–145)
Sodium: 141 mmol/L (ref 135–145)
TCO2: 28 mmol/L (ref 22–32)
TCO2: 28 mmol/L (ref 22–32)
pCO2, Ven: 43.4 mmHg — ABNORMAL LOW (ref 44–60)
pCO2, Ven: 44 mmHg (ref 44–60)
pH, Ven: 7.397 (ref 7.25–7.43)
pH, Ven: 7.401 (ref 7.25–7.43)
pO2, Ven: 31 mmHg — CL (ref 32–45)
pO2, Ven: 32 mmHg (ref 32–45)

## 2024-12-15 LAB — CBC
HCT: 38.4 % (ref 36.0–46.0)
Hemoglobin: 12.7 g/dL (ref 12.0–15.0)
MCH: 31.5 pg (ref 26.0–34.0)
MCHC: 33.1 g/dL (ref 30.0–36.0)
MCV: 95.3 fL (ref 80.0–100.0)
Platelets: 256 K/uL (ref 150–400)
RBC: 4.03 MIL/uL (ref 3.87–5.11)
RDW: 13.2 % (ref 11.5–15.5)
WBC: 10 K/uL (ref 4.0–10.5)
nRBC: 0 % (ref 0.0–0.2)

## 2024-12-15 MED ORDER — SODIUM CHLORIDE 0.9 % IV SOLN: 250.0000 mL | INTRAVENOUS | Status: AC | PRN

## 2024-12-15 MED ORDER — ASPIRIN 81 MG PO CHEW: 81.0000 mg | CHEWABLE_TABLET | ORAL | Status: DC

## 2024-12-15 MED ORDER — ASPIRIN 81 MG PO CHEW
81.0000 mg | CHEWABLE_TABLET | ORAL | Status: AC
  Administered 2024-12-15: 81 mg via ORAL
  Filled 2024-12-15: qty 1

## 2024-12-15 MED ORDER — SODIUM CHLORIDE 0.9% FLUSH
10.0000 mL | Freq: Two times a day (BID) | INTRAVENOUS | Status: DC
  Administered 2024-12-15: 10 mL
  Administered 2024-12-16: 20 mL
  Administered 2024-12-17: 10 mL
  Administered 2024-12-18: 30 mL
  Administered 2024-12-18: 10 mL
  Administered 2024-12-19: 30 mL
  Administered 2024-12-19 – 2024-12-20 (×2): 10 mL
  Administered 2024-12-20: 20 mL
  Administered 2024-12-21 – 2024-12-22 (×3): 10 mL

## 2024-12-15 MED ORDER — SODIUM CHLORIDE 0.9% FLUSH: 3.0000 mL | INTRAVENOUS | Status: DC | PRN

## 2024-12-15 MED ORDER — ALUM & MAG HYDROXIDE-SIMETH 200-200-20 MG/5ML PO SUSP: 15.0000 mL | ORAL | Status: DC | PRN

## 2024-12-15 MED ORDER — IOHEXOL 350 MG/ML SOLN
INTRAVENOUS | Status: DC | PRN
  Administered 2024-12-15: 30 mL

## 2024-12-15 MED ORDER — SODIUM CHLORIDE 0.9% FLUSH
3.0000 mL | Freq: Two times a day (BID) | INTRAVENOUS | Status: DC
  Administered 2024-12-15 – 2024-12-22 (×13): 3 mL via INTRAVENOUS

## 2024-12-15 MED ORDER — SODIUM CHLORIDE 0.9 % WEIGHT BASED INFUSION: 1.0000 mL/kg/h | INTRAVENOUS | Status: AC

## 2024-12-15 MED ORDER — FENTANYL CITRATE (PF) 100 MCG/2ML IJ SOLN
INTRAMUSCULAR | Status: AC
  Filled 2024-12-15: qty 2

## 2024-12-15 MED ORDER — CHLORHEXIDINE GLUCONATE CLOTH 2 % EX PADS
6.0000 | MEDICATED_PAD | Freq: Every day | CUTANEOUS | Status: DC
  Administered 2024-12-15 – 2024-12-22 (×8): 6 via TOPICAL

## 2024-12-15 MED ORDER — ROSUVASTATIN CALCIUM 5 MG PO TABS
10.0000 mg | ORAL_TABLET | Freq: Every day | ORAL | Status: DC
  Administered 2024-12-15: 10 mg via ORAL
  Filled 2024-12-15: qty 2

## 2024-12-15 MED ORDER — HEPARIN SODIUM (PORCINE) 5000 UNIT/ML IJ SOLN
5000.0000 [IU] | Freq: Three times a day (TID) | INTRAMUSCULAR | Status: DC
  Administered 2024-12-15 – 2024-12-22 (×22): 5000 [IU] via SUBCUTANEOUS
  Filled 2024-12-15 (×22): qty 1

## 2024-12-15 MED ORDER — SODIUM CHLORIDE 0.9% FLUSH
3.0000 mL | Freq: Two times a day (BID) | INTRAVENOUS | Status: DC
  Administered 2024-12-15: 3 mL via INTRAVENOUS

## 2024-12-15 MED ORDER — VERAPAMIL HCL 2.5 MG/ML IV SOLN
INTRAVENOUS | Status: DC | PRN
  Administered 2024-12-15: 10 mL via INTRA_ARTERIAL

## 2024-12-15 MED ORDER — VERAPAMIL HCL 2.5 MG/ML IV SOLN
INTRAVENOUS | Status: AC
  Filled 2024-12-15: qty 2

## 2024-12-15 MED ORDER — FENTANYL CITRATE (PF) 100 MCG/2ML IJ SOLN
INTRAMUSCULAR | Status: DC | PRN
  Administered 2024-12-15: 25 ug via INTRAVENOUS

## 2024-12-15 MED ORDER — HEPARIN (PORCINE) IN NACL 1000-0.9 UT/500ML-% IV SOLN
INTRAVENOUS | Status: DC | PRN
  Administered 2024-12-15 (×2): 500 mL

## 2024-12-15 MED ORDER — HEPARIN SODIUM (PORCINE) 1000 UNIT/ML IJ SOLN
INTRAMUSCULAR | Status: DC | PRN
  Administered 2024-12-15: 4000 [IU] via INTRAVENOUS

## 2024-12-15 MED ORDER — MILRINONE LACTATE IN DEXTROSE 20-5 MG/100ML-% IV SOLN
0.3750 ug/kg/min | INTRAVENOUS | Status: DC
  Administered 2024-12-15 – 2024-12-19 (×6): 0.25 ug/kg/min via INTRAVENOUS
  Administered 2024-12-19 – 2024-12-23 (×7): 0.375 ug/kg/min via INTRAVENOUS
  Filled 2024-12-15 (×13): qty 100

## 2024-12-15 MED ORDER — MIDAZOLAM HCL (PF) 2 MG/2ML IJ SOLN
INTRAMUSCULAR | Status: DC | PRN
  Administered 2024-12-15: 1 mg via INTRAVENOUS

## 2024-12-15 MED ORDER — SODIUM CHLORIDE 0.9% FLUSH: 10.0000 mL | INTRAVENOUS | Status: DC | PRN

## 2024-12-15 MED ORDER — SODIUM CHLORIDE 0.9 % IV SOLN
INTRAVENOUS | Status: AC | PRN
  Administered 2024-12-15: 75 mL/h via INTRAVENOUS

## 2024-12-15 MED ORDER — LABETALOL HCL 5 MG/ML IV SOLN: 10.0000 mg | INTRAVENOUS | Status: AC | PRN

## 2024-12-15 MED ORDER — SODIUM CHLORIDE 0.9 % IV SOLN: 250.0000 mL | INTRAVENOUS | Status: DC | PRN

## 2024-12-15 MED ORDER — HEPARIN SODIUM (PORCINE) 1000 UNIT/ML IJ SOLN
INTRAMUSCULAR | Status: AC
  Filled 2024-12-15: qty 10

## 2024-12-15 MED ORDER — ACETAMINOPHEN 325 MG PO TABS
650.0000 mg | ORAL_TABLET | ORAL | Status: DC | PRN
  Administered 2024-12-16: 650 mg via ORAL
  Filled 2024-12-15: qty 2

## 2024-12-15 MED ORDER — LIDOCAINE HCL (PF) 1 % IJ SOLN
INTRAMUSCULAR | Status: AC
  Filled 2024-12-15: qty 30

## 2024-12-15 MED ORDER — MIDAZOLAM HCL 2 MG/2ML IJ SOLN
INTRAMUSCULAR | Status: AC
  Filled 2024-12-15: qty 2

## 2024-12-15 MED ORDER — ONDANSETRON HCL 4 MG/2ML IJ SOLN: 4.0000 mg | Freq: Four times a day (QID) | INTRAMUSCULAR | Status: DC | PRN

## 2024-12-15 MED ORDER — LIDOCAINE HCL (PF) 1 % IJ SOLN
INTRAMUSCULAR | Status: DC | PRN
  Administered 2024-12-15 (×2): 2 mL

## 2024-12-15 MED ORDER — HYDRALAZINE HCL 20 MG/ML IJ SOLN: 10.0000 mg | INTRAMUSCULAR | Status: AC | PRN

## 2024-12-15 NOTE — Progress Notes (Signed)
 Peripherally Inserted Central Catheter Placement  The IV Nurse has discussed with the patient and/or persons authorized to consent for the patient, the purpose of this procedure and the potential benefits and risks involved with this procedure.  The benefits include less needle sticks, lab draws from the catheter, and the patient may be discharged home with the catheter. Risks include, but not limited to, infection, bleeding, blood clot (thrombus formation), and puncture of an artery; nerve damage and irregular heartbeat and possibility to perform a PICC exchange if needed/ordered by physician.  Alternatives to this procedure were also discussed.  Bard Power PICC patient education guide, fact sheet on infection prevention and patient information card has been provided to patient /or left at bedside.    PICC Placement Documentation  PICC Double Lumen 12/15/24 Right Basilic 37 cm 0 cm (Active)  Indication for Insertion or Continuance of Line Chronic illness with exacerbations (CF, Sickle Cell, etc.);Vasoactive infusions 12/15/24 1531  Exposed Catheter (cm) 0 cm 12/15/24 1531  Site Assessment Clean, Dry, Intact 12/15/24 1531  Lumen #1 Status Saline locked;Flushed;Blood return noted 12/15/24 1531  Lumen #2 Status Saline locked;Flushed;Blood return noted 12/15/24 1531  Dressing Type Transparent;Securing device 12/15/24 1531  Dressing Status Antimicrobial disc/dressing in place;Clean, Dry, Intact 12/15/24 1531  Line Care Connections checked and tightened 12/15/24 1531  Line Adjustment (NICU/IV Team Only) No 12/15/24 1531  Dressing Intervention New dressing;Adhesive placed at insertion site (IV team only) 12/15/24 1531  Dressing Change Due 12/22/24 12/15/24 1531   Telephone consent obtained from son, Ethel Lenis.    Karen Warner 12/15/2024, 3:32 PM

## 2024-12-15 NOTE — ED Notes (Signed)
 MD at [facility]

## 2024-12-15 NOTE — Progress Notes (Addendum)
 "    Advanced Heart Failure Rounding Note  Cardiologist: Dub Huntsman, DO  [facility] Cardiologist: Dr. Rolan  Chief Complaint: A/c systolic HF w/ low output  Patient Profile   Karen Warner is a 74 y.o. female with with history of CAD and ischemic cardiomyopathy, EF <20%, CKD III and COPD admitted w/ a/c CHF, chest/abdominal pain. RHC c/w low output.   Significant events:   1/15: RHC (RA 2, PA 21/10, PCW 3,  PA sat 59%, TD CI 1.49, FICK CI 1.88, PAPi 5.5), started on milrinone            LHC patent stents, nonobstructive CAD    Subjective:    On Milrinone  0.25. Co-ox 61%, CVP remains low < 2    Scr improving 1.9>>1.5   Still w/ abdominal discomfort, mainly RUQ. Appetite not great. No dyspnea.   Objective:   Weight Range: 76.3 kg Body mass index is 27.99 kg/m.   Vital Signs:   Temp:  [97.6 F (36.4 C)-98 F (36.7 C)] 97.8 F (36.6 C) (01/15 0449) Pulse Rate:  [70-97] 97 (01/15 0545) Resp:  [11-20] 15 (01/15 0545) BP: (92-151)/(50-137) 121/72 (01/15 0545) SpO2:  [100 %] 100 % (01/15 0545) Weight:  [76.3 kg] 76.3 kg (01/14 2245)    Weight change: Filed Weights   12/14/24 2245  Weight: 76.3 kg    Intake/Output:  No intake or output data in the 24 hours ending 12/15/24 0714   Physical Exam   General:  fatigued appearing.   Cor: Regular rate & rhythm. No murmurs. JVD  not elevated.  Lungs: clear Extremities: no edema   Telemetry   SR 90s, barostim artifact, personally reviewed    Labs   CBC Recent Labs    12/14/24 0826 12/15/24 0433  WBC 11.3* 10.0  HGB 12.2 12.7  HCT 37.0 38.4  MCV 96.1 95.3  PLT 266 256   Basic Metabolic Panel Recent Labs    98/85/73 0826 12/15/24 0433  NA 131* 137  K 4.8 4.4  CL 96* 100  CO2 24 26  GLUCOSE 255* 147*  BUN 25* 26*  CREATININE 1.93* 1.92*  CALCIUM  9.5 9.2   Liver Function Tests Recent Labs    12/14/24 0826  AST 17  ALT 11  ALKPHOS 64  BILITOT 0.5  PROT 7.4  ALBUMIN 4.1   Recent Labs     12/14/24 0826  LIPASE 23   Cardiac Enzymes No results for input(s): CKTOTAL, CKMB, CKMBINDEX, TROPONINI in the last 72 hours.  BNP: BNP (last 3 results) Recent Labs    09/01/24 1457  BNP 169.4*    ProBNP (last 3 results) Recent Labs    12/13/24 1529 12/14/24 0826  PROBNP 1,342.0* 1,539.0*     D-Dimer No results for input(s): DDIMER in the last 72 hours. Hemoglobin A1C No results for input(s): HGBA1C in the last 72 hours. Fasting Lipid Panel No results for input(s): CHOL, HDL, LDLCALC, TRIG, CHOLHDL, LDLDIRECT in the last 72 hours. Medications:   Scheduled Medications:  aspirin  EC  81 mg Oral Daily   clopidogrel   75 mg Oral Daily   cyanocobalamin   1,000 mcg Oral Daily   diclofenac  Sodium  2 g Topical QID   dicyclomine   10 mg Oral TID AC & HS   digoxin   0.0625 mg Oral Daily   empagliflozin   10 mg Oral Daily   ezetimibe   10 mg Oral Daily   folic acid   1 mg Oral Daily   heparin   5,000 Units Subcutaneous  Q8H   insulin  aspart  0-15 Units Subcutaneous TID WC   insulin  glargine  25 Units Subcutaneous BID   isosorbide  mononitrate  15 mg Oral Daily   lidocaine   1 patch Transdermal Q24H   loratadine   10 mg Oral Daily   pantoprazole   40 mg Oral Daily   ranolazine   500 mg Oral BID   rosuvastatin   40 mg Oral QHS   sodium chloride  flush  3 mL Intravenous Q12H   valACYclovir   500 mg Oral Daily   [START ON 12/18/2024] Vitamin D  (Ergocalciferol )  50,000 Units Oral Q Sun   vortioxetine  HBr  5 mg Oral QHS    Infusions:  sodium chloride       PRN Medications: sodium chloride , acetaminophen  **OR** acetaminophen , albuterol , cyclobenzaprine , morphine  injection, nitroGLYCERIN , sodium chloride  flush, Ubrogepant   Assessment/Plan   1. Acute on chronic systolic CHF: Ischemic cardiomyopathy.  [employer] CRT-D device.  Echo in 8/24 showed EF < 20%, moderate LV dilation, mild LVH, RV normal.  This is somewhat worse than priors. LHC/RHC in 11/24 showed low  CI at 2.12, normal filling pressures, unchanged coronary anatomy with no severe stenoses. S/p barostimulation activation therapy device 2/25. Echo from [facility] showed EF 20-25%, paradoxical LV motion. Echo 12/25 showed EF<20%, normal RV.  We have been concerned about possible low output HF, I wonder if this could be contributing to her GI symptoms.  RHC/LHC this admit showed stable coronary anatomy and low filling pressures but low output by Fick and thermodilution.  Started on milrinone  0.25 mcg/kg/min post cath. Diuretics held. Co-ox 61% today on milrinone , CVP remains low < 2, SCr improving.  - Continue Milrinone  0.25 mcg/kg/min - Continue to hold diuretics  - Continue Jardiance .  - Spironolactone  and losartan  held w/ elevated creatinine, can likely resume as SCr improves - Hold Coreg  with low output.  - Digoxin  held with elevated level (1.2), can eventually restart at 0.0625 mg every other day.  - She needs evaluation for advanced therapies (LVAD). LVAD coordinators aware. Will plan family meeting on Monday for GOC discussion  2.  CAD: PCI to mid/distal LAD in 2020 and proximal LAD in 2021.  LHC in 11/24 showed stable coronary anatomy with no severe stenoses. She reported to the ER with 2 days of atypical chest pain.  TnT minimally elevated with no trend, this is probably due to demand ischemia.  Cath this admit showed stable coronary anatomy.  - Continue ranolazine  500 mg bid.  - Continue ASA, hold Plavix  for now for LVAD consideration.  - Continue Crestor  + Zetia , goal LDL < 55. 3. COPD: History of smoking, quit 2021.   4. CKD 3: Follows with nephology at [facility]. Creatinine improved today, 1.9>>1.5  - continue milrinone  per above  - Hold losartan  and spironolactone  w/ soft BP .  - Can continue Jardiance  5. Obesity: - Has been on semaglutide. 6. PAD: Moderately decreased ABI on right in 7/25.  No claudication.  - She saw Dr. Serene, plan medical management.  7. GI: RUQ and  epigastric pain.  No evidence by US  for acute cholecystitis.  GI symptoms could be a manifestation of CHF but symptoms not improving despite milrinone  support - will consult GI (followed by [facility])   Length of Stay: 0  Brittainy Simmons, PA-C  12/15/2024, 7:14 AM  Advanced Heart Failure Team Pager [phone] (M-F; 7a - 5p)   Please visit [email or web address]: For overnight coverage please call cardiology fellow first. If fellow not available call Shock/ECMO MD on call.  For ECMO / Mechanical Support (Impella, IABP, LVAD) issues call Shock / ECMO MD on call.   Patient seen with PA, I formulated the plan and agree with the above note.   Co-ox 61% on milrinone  0.25, CVP < 5.  Creatinine 1.9 => 1.54.  SBP 90s-100s.   She is still having abdominal discomfort.   General: NAD Neck: No JVD, no thyromegaly or thyroid nodule.  Lungs: Clear to auscultation bilaterally with normal respiratory effort. CV: Nondisplaced PMI.  Heart regular S1/S2, no S3/S4, no murmur.  No peripheral edema.    Abdomen: Soft, nontender, no hepatosplenomegaly, no distention.  Skin: Intact without lesions or rashes.  Neurologic: Alert and oriented x 3.  Psych: Normal affect. Extremities: No clubbing or cyanosis.  HEENT: Normal.   Low output HF by RHC yesterday with low filling pressures.  GDMT has been limited by low blood pressure.  She was admitted with dyspnea/fatigue but also abdominal pain.  Workup so far has not found an etiology for her abdominal discomfort making me concerned that it could be related to her low output HF.  So far, now improved with milrinone  gtt started yesterday.  Co-ox ok at 61%, CVP remains low. Creatinine is improving.  - Continue milrinone  0.25 - We had a discussion about LVAD placement.  With persistently low EF and low output by Fick and thermodilution, I think it is time to consider this.  She has family coming in (son and daughter) and wants to get everyone together to discuss on Monday.  LVAD  coordinator aware.  - I would like her to be seen by her GI MD to make sure that we are not missing anything prior to considering LVAD, will consult today.  - No diuretic needed at this time.  - Mobilize.   Ezra Shuck 12/16/2024 9:10 AM  "

## 2024-12-15 NOTE — Progress Notes (Signed)
 Brief MCS Note:  VAD Coordinators asked to see patient to discuss advanced therapies following RHC today. Pt very overwhelmed and asked for us  to revisit the conversation tomorrow. Dr. Rolan made aware.  Schuyler Lunger RN, BSN VAD Coordinator 24/7 Pager [phone]

## 2024-12-15 NOTE — Progress Notes (Signed)
 Patient ID: Karen Warner Karen Warner, female   DOB: Aug 25, 1951, 74 y.o.   MRN: [id number]     Advanced Heart Failure Rounding Note  Cardiologist: Dub Huntsman, DO  [facility] Cardiologist: Dr. Rolan      No significant abnormalities on abdominal imaging.  She still has some epigastric discomfort.  No dyspnea.   RHC/LHC done today: Coronary Findings  Diagnostic Dominance: Left Left Anterior Descending  Ost LAD lesion is 40% stenosed.  Non-stenotic Mid LAD lesion was previously treated.  Dist LAD lesion is 40% stenosed. The lesion was previously treated . Possible chronic dissection at distal stent edge, similar to prior study    First Diagonal Branch  1st Diag lesion is 60% stenosed.    Second Diagonal Branch  2nd Diag lesion is 70% stenosed.    Third Diagonal Branch    Left Circumflex  Ost Cx to Prox Cx lesion is 30% stenosed.    Second Obtuse Marginal Branch  2nd Mrg lesion is 40% stenosed.    Left Posterior Atrioventricular Artery  LPAV lesion is 40% stenosed.    Right Coronary Artery  Vessel is small.    Intervention   No interventions have been documented.   Right Heart  Right Heart Pressures RHC Procedural Findings: Hemodynamics (mmHg) RA mean 2 RV 23/4 PA 21/10, mean 13 PCWP mean 3 LV 115/6 AO 121/59  Oxygen saturations: PA 59% AO 100%  Cardiac Output (Fick) 3.45  Cardiac Index (Fick) 1.88   Cardiac Output (Thermo) 2.74  Cardiac Index (Thermo) 1.49  PAPi 5.5     Objective:   Weight Range: 76.3 kg Body mass index is 27.99 kg/m.   Vital Signs:   Temp:  [97.6 F (36.4 C)-98 F (36.7 C)] 97.6 F (36.4 C) (01/15 0855) Pulse Rate:  [0-97] 0 (01/15 1212) Resp:  [0-20] 15 (01/15 1202) BP: (102-151)/(50-137) 121/70 (01/15 1207) SpO2:  [96 %-100 %] 100 % (01/15 1202) Weight:  [76.3 kg] 76.3 kg (01/14 2245)    Weight change: Filed Weights   12/14/24 2245  Weight: 76.3 kg    Intake/Output:  No intake or output data in the 24 hours  ending 12/15/24 1215   Physical Exam   General: NAD Neck: No JVD, no thyromegaly or thyroid nodule.  Lungs: Clear to auscultation bilaterally with normal respiratory effort. CV: Lateral PMI.  Heart regular S1/S2, no S3/S4, no murmur.  No peripheral edema.    Abdomen: Soft, nontender, no hepatosplenomegaly, no distention.  Skin: Intact without lesions or rashes.  Neurologic: Alert and oriented x 3.  Psych: Normal affect. Extremities: No clubbing or cyanosis.  HEENT: Normal.   Telemetry   Barostimulator artifact (personally reviewed)  Labs   CBC Recent Labs    12/14/24 0826 12/15/24 0433  WBC 11.3* 10.0  HGB 12.2 12.7  HCT 37.0 38.4  MCV 96.1 95.3  PLT 266 256   Basic Metabolic Panel Recent Labs    98/85/73 0826 12/15/24 0433  NA 131* 137  K 4.8 4.4  CL 96* 100  CO2 24 26  GLUCOSE 255* 147*  BUN 25* 26*  CREATININE 1.93* 1.92*  CALCIUM  9.5 9.2   Liver Function Tests Recent Labs    12/14/24 0826  AST 17  ALT 11  ALKPHOS 64  BILITOT 0.5  PROT 7.4  ALBUMIN 4.1   Recent Labs    12/14/24 0826  LIPASE 23   Cardiac Enzymes No results for input(s): CKTOTAL, CKMB, CKMBINDEX, TROPONINI in the last 72 hours.  BNP: BNP (last  3 results) Recent Labs    09/01/24 1457  BNP 169.4*    ProBNP (last 3 results) Recent Labs    12/13/24 1529 12/14/24 0826  PROBNP 1,342.0* 1,539.0*     D-Dimer No results for input(s): DDIMER in the last 72 hours. Hemoglobin A1C No results for input(s): HGBA1C in the last 72 hours. Fasting Lipid Panel No results for input(s): CHOL, HDL, LDLCALC, TRIG, CHOLHDL, LDLDIRECT in the last 72 hours. Medications:   Scheduled Medications:  [MAR Hold] aspirin  EC  81 mg Oral Daily   [MAR Hold] clopidogrel   75 mg Oral Daily   [MAR Hold] cyanocobalamin   1,000 mcg Oral Daily   [MAR Hold] diclofenac  Sodium  2 g Topical QID   [MAR Hold] dicyclomine   10 mg Oral TID AC & HS   [MAR Hold] empagliflozin   10 mg Oral  Daily   [MAR Hold] ezetimibe   10 mg Oral Daily   [MAR Hold] folic acid   1 mg Oral Daily   [MAR Hold] heparin   5,000 Units Subcutaneous Q8H   [MAR Hold] insulin  aspart  0-15 Units Subcutaneous TID WC   [MAR Hold] insulin  glargine  25 Units Subcutaneous BID   [MAR Hold] isosorbide  mononitrate  15 mg Oral Daily   [MAR Hold] lidocaine   1 patch Transdermal Q24H   [MAR Hold] loratadine   10 mg Oral Daily   [MAR Hold] pantoprazole   40 mg Oral Daily   [MAR Hold] ranolazine   500 mg Oral BID   [MAR Hold] rosuvastatin   10 mg Oral QHS   sodium chloride  flush  3 mL Intravenous Q12H   [MAR Hold] valACYclovir   500 mg Oral Daily   [MAR Hold] Vitamin D  (Ergocalciferol )  50,000 Units Oral Q Sun   [MAR Hold] vortioxetine  HBr  5 mg Oral QHS    Infusions:  sodium chloride      sodium chloride  75 mL/hr (12/15/24 1125)   milrinone       PRN Medications: sodium chloride , sodium chloride , [MAR Hold] acetaminophen  **OR** [MAR Hold] acetaminophen , [MAR Hold] albuterol , [MAR Hold] alum & mag hydroxide-simeth, [MAR Hold] cyclobenzaprine , fentaNYL , Heparin  (Porcine) in NaCl, heparin  sodium (porcine), iohexol , lidocaine  (PF), midazolam  PF, [MAR Hold] nitroGLYCERIN , Radial Cocktail/Verapamil  only, sodium chloride  flush  Assessment/Plan   1. Acute on chronic systolic CHF: Ischemic cardiomyopathy.  [employer] CRT-D device.  Echo in 8/24 showed EF < 20%, moderate LV dilation, mild LVH, RV normal.  This is somewhat worse than priors. LHC/RHC in 11/24 showed low CI at 2.12, normal filling pressures, unchanged coronary anatomy with no severe stenoses. S/p barostimulation activation therapy device 2/25. Echo from [facility] showed EF 20-25%, paradoxical LV motion. Echo 12/25 showed EF<20%, normal RV.  We have been concerned about possible low output HF, I wonder if this could be contributing to her GI symptoms.  RHC/LHC today showed stable coronary anatomy and low filling pressures but low output by Fick and  thermodilution.  - No diuretic for now.  - Place PICC to follow CVP and co-ox.  - I will start her on milrinone  0.25 mcg/kg/min.  - Hold spironolactone  and losartan  for now with elevated creatinine to 1.93.  - Hold Coreg  with low output.  - Hold digoxin  with elevated level, can start back eventually at 0.0625 mg every other day.  - Continue Jardiance .  - She needs evaluation for advanced therapies (LVAD). I spoke with LVAD coordinators today and they will begin evaluation.    2.  CAD: PCI to mid/distal LAD in 2020 and proximal LAD in 2021.  LHC in 11/24 showed stable coronary anatomy with no severe stenoses. She reported to the ER with 2 days of atypical chest pain.  TnT minimally elevated with no trend, this is probably due to demand ischemia.  Cath today showed stable coronary anatomy.  - Continue ranolazine  500 mg bid.  - Continue ASA, hold Plavix  for now for LVAD consideration.  - Continue Crestor  + Zetia , goal LDL < 55. 3. COPD: History of smoking, quit 2021.   4. CKD 3: Follows with nephology at [facility]. Creatinine up to 1.9 today.  - Hold losartan  and spironolactone  until after cath.  - Can continue Jardiance  - Hopefully will improve with milrinone .  5. Obesity: - Has been on semaglutide. 6. PAD: Moderately decreased ABI on right in 7/25.  No claudication.  - She saw Dr. Serene, plan medical management.  7. GI: RUQ and epigastric pain.  No evidence by US  for acute cholecystitis.  GI symptoms could be a manifestation of CHF.   Length of Stay: 0  Ezra Shuck, MD  12/15/2024, 12:15 PM  Advanced Heart Failure Team Pager [phone] (M-F; 7a - 5p)   Please visit [email or web address]: For overnight coverage please call cardiology fellow first. If fellow not available call Shock/ECMO MD on call.  For ECMO / Mechanical Support (Impella, IABP, LVAD) issues call Shock / ECMO MD on call.

## 2024-12-15 NOTE — Progress Notes (Signed)
 "   HD#0 Subjective:   Summary: Karen Warner is a 74 y.o. person living with a history of CAD, pacemaker placement, [place]s stimulator ischemic cardiomyopathy, COPD, fibromyalgia, CKD who presented with chest pain and admitted for cardiac catheterization.   Overnight Events: No acute overnight events  Interval history: This morning patient is still complaining of pain in her chest, neck, arm, and abdomen, tracking all the way around the bottom of her ribcage. Patient also states the morphine , flexeril , and tylenol  have helped to mildly improve the pain. Her headache has also resolved. She tolerated PO intake well yesterday, but states as soon as she finished eating the pain started again. Pain mostly in upper stomach. No nausea or vomiting.   Objective:  Vital signs in last 24 hours: Vitals:   12/15/24 0405 12/15/24 0449 12/15/24 0545 12/15/24 0855  BP: (!) 151/137  121/72 125/82  Pulse: 80  97 82  Resp: 16  15 16   Temp:  97.8 F (36.6 C)  97.6 F (36.4 C)  TempSrc:  Temporal  Oral  SpO2: 100%  100% 100%  Weight:      Height:       Supplemental O2: Room Air SpO2: 100 %   Physical Exam:  Constitutional: well-appearing elderly woman sitting in bed, in no acute distress Cardiovascular: regular rate and rhythm, no m/r/g. Chest pain reproducible across entire chest wall. Pacemaker scar present on left chest wall and cardiac device implant scar present on right chest wall. No signs of erythema, swelling, warmth, irritation.  Pulmonary/Chest: normal work of breathing on room air, lungs clear to auscultation bilaterally Abdominal: tenderness to palpation in the epigastric and RUQ, abdomen mildly-distended  MSK: normal bulk and tone. No lower extremity edema. Gait normal.    Filed Weights   12/14/24 2245  Weight: 76.3 kg    No intake or output data in the 24 hours ending 12/15/24 1141 Net IO Since Admission: No IO data has been entered for this period [12/15/24  1141]  Pertinent Labs:    Latest Ref Rng & Units 12/15/2024    4:33 AM 12/14/2024    8:26 AM 12/06/2024    3:56 PM  CBC  WBC 4.0 - 10.5 K/uL 10.0  11.3  10.2   Hemoglobin 12.0 - 15.0 g/dL 87.2  87.7  87.4   Hematocrit 36.0 - 46.0 % 38.4  37.0  38.8   Platelets 150 - 400 K/uL 256  266  307        Latest Ref Rng & Units 12/15/2024    4:33 AM 12/14/2024    8:26 AM 12/13/2024    3:29 PM  CMP  Glucose 70 - 99 mg/dL 852  744  752   BUN 8 - 23 mg/dL 26  25  19    Creatinine 0.44 - 1.00 mg/dL 8.07  8.06  8.48   Sodium 135 - 145 mmol/L 137  131  133   Potassium 3.5 - 5.1 mmol/L 4.4  4.8  4.8   Chloride 98 - 111 mmol/L 100  96  97   CO2 22 - 32 mmol/L 26  24  26    Calcium  8.9 - 10.3 mg/dL 9.2  9.5  89.9   Total Protein 6.5 - 8.1 g/dL  7.4    Total Bilirubin 0.0 - 1.2 mg/dL  0.5    Alkaline Phos 38 - 126 U/L  64    AST 15 - 41 U/L  17    ALT 0 - 44 U/L  11      Imaging: US  Abdomen Limited RUQ (LIVER/GB) Result Date: 12/14/2024 CLINICAL DATA:  Right upper quadrant abdominal pain EXAM: ULTRASOUND ABDOMEN LIMITED RIGHT UPPER QUADRANT COMPARISON:  CT scan of same day. FINDINGS: Gallbladder: No gallstones or wall thickening visualized. No sonographic Murphy sign noted by sonographer. Minimal amount of sludge may be present within gallbladder lumen. Common bile duct: Diameter: 5 mm which is within normal limits. Liver: No focal lesion identified. Within normal limits in parenchymal echogenicity. Portal vein is patent on color Doppler imaging with normal direction of blood flow towards the liver. Other: None. IMPRESSION: Minimal amount of sludge may be present within gallbladder lumen. No other abnormality seen in the right upper quadrant of the abdomen. Electronically Signed   By: Lynwood Landy Raddle M.D.   On: 12/14/2024 13:54    Assessment/Plan:   Principal Problem:   Chest pain Active Problems:   Diabetes mellitus [facility])   Patient Summary: Karen Warner is a 74 y.o. with a  pertinent PMH of CAD, pacemaker placement, [place]s stimulator ischemic cardiomyopathy, COPD, fibromyalgia, CKD who presented with chest pain and admitted for cardiac catheterization.   Acute on chronic systolic heart failure Ischemic cardiomyopathy with [employer] CRT-D device and barostim device. Multiple prior echos showing EF <20-25%. Recent RHC/LHC today demonstrated low cardiac output by both Fick and thermodilution, with low filling pressures and stable coronary anatomy, consistent with low output heart failure rather than volume overload. GI symptoms may be secondary to low output physiology.   - no diuretics at this time given low filling pressures - start milrinone  infusion at 0.25 mcg/kg/min - place PICC line for CVP and co-ox monitoring - monitor strict I/os and daily weights - hold coreg  3.125 mg BID due to low output state - hold losartan  25mg  daily due to AKI - hold spironolactone  25mg  daily due to AKI - hold digoxin  given elevated level; may restart 0.0625 mg every other day once renal function stabilizes.  - continue jardiance  10mg  daily - advanced HF therapy evaluation initiated; LVAD coordinators consulted and evaluation underway  Chest pain/CAD History of PCI to mid/distal LAD (2020) and proximal LAD (2021); [facility] 11/24 showed stable coronary anatomy with no severe stenoses. Presented with atypical chest pain and minimally elevated troponin without trend, felt to represent demand ischemia. Cath today showed stable coronary anatomy without obstructive disease. Chest pain likely multifactorial with significant musculoskeletal/ICD site component.  - continue aspirin  81mg  daily - hold plavix  for now for LVAD consideration - continue ranolazine  500mg  BID - continue ezetimibe  10mg  daily  - continue imdur  15mg  daily - continue nitroglycerin  PRN  Abdominal pain RUQ US  and CT A/P negative for acute cholecystitis. Symptoms may be related to low output heart failure. -  continue protonix  40mg  daily - continue bentyl  10mg  TID - continue maalox/mylanta q 4 hours PRN  AKI on CKD stage 3 Baseline creatinine ~1.5, currently ~1.9. likely cardiorenal in the setting of low-output HF and recent contrast exposure - monitor daily BMP - avoid nephrotoxins - renally dose medications - hold losartan  and spironolactone  - continue jardiance  10mg  daily  Chronic pain/fibromyalgia Chest, neck, and back pain reproducible on exam. No signs of infection near ICD site. - continue tylenol  PRN - continue flexeril  5mg  TID PRN - continue lidocaine  patch - continue volataren gel - avoid NSAIDs systemically given renal function  Type 2 diabetes Last A1c 6.3 (12/9) - continue lantus  25u BID - continue SSI - continue jardiance  10mg  daily - glucose checks  Depression - continue trintellix  5mg  nightly  COPD Former smoker quit 2021. No evidence of acute exacerbation - continue albuterol  PRN  Other Medications - continue vitamin B12, folic acid , vitamin D  - continue valacyclovir  500mg  daily  Diet: Heart healthy and Carb modified IVF: NS,75cc/hr VTE: Heparin  Code: Full PT/OT recs: Pending, none. Family Update: Son   Dispo: Anticipated discharge to Home in more than 2 midnights pending clinical improvement.   Nonda Carrie, MS3 Please contact the on call pager after 5 pm and on weekends at [phone].  _______________________________________________________________  I was personally present and re-performed the exam and medical decision making and verified the service and findings are accurately documented in the student's note.  Matthews Franks, DO 12/15/2024 3:20 PM   "

## 2024-12-15 NOTE — Interval H&P Note (Signed)
 History and Physical Interval Note:  12/15/2024 11:09 AM  Karen Warner Karen Warner  has presented today for surgery, with the diagnosis of heart failure.  The various methods of treatment have been discussed with the patient and family. After consideration of risks, benefits and other options for treatment, the patient has consented to  Procedures: RIGHT/LEFT HEART CATH AND CORONARY ANGIOGRAPHY (N/A) as a surgical intervention.  The patient's history has been reviewed, patient examined, no change in status, stable for surgery.  I have reviewed the patient's chart and labs.  Questions were answered to the patient's satisfaction.     Henley Boettner [employer]

## 2024-12-16 ENCOUNTER — Inpatient Hospital Stay (HOSPITAL_COMMUNITY)

## 2024-12-16 DIAGNOSIS — Z90410 Acquired total absence of pancreas: Secondary | ICD-10-CM | POA: Diagnosis not present

## 2024-12-16 DIAGNOSIS — Z8719 Personal history of other diseases of the digestive system: Secondary | ICD-10-CM | POA: Diagnosis not present

## 2024-12-16 DIAGNOSIS — K219 Gastro-esophageal reflux disease without esophagitis: Secondary | ICD-10-CM

## 2024-12-16 DIAGNOSIS — Z860100 Personal history of colon polyps, unspecified: Secondary | ICD-10-CM

## 2024-12-16 DIAGNOSIS — J449 Chronic obstructive pulmonary disease, unspecified: Secondary | ICD-10-CM | POA: Diagnosis not present

## 2024-12-16 DIAGNOSIS — I5032 Chronic diastolic (congestive) heart failure: Secondary | ICD-10-CM

## 2024-12-16 DIAGNOSIS — M542 Cervicalgia: Secondary | ICD-10-CM | POA: Diagnosis not present

## 2024-12-16 DIAGNOSIS — N189 Chronic kidney disease, unspecified: Secondary | ICD-10-CM | POA: Diagnosis not present

## 2024-12-16 DIAGNOSIS — Z95810 Presence of automatic (implantable) cardiac defibrillator: Secondary | ICD-10-CM | POA: Diagnosis not present

## 2024-12-16 DIAGNOSIS — R1011 Right upper quadrant pain: Secondary | ICD-10-CM | POA: Diagnosis not present

## 2024-12-16 DIAGNOSIS — I5023 Acute on chronic systolic (congestive) heart failure: Secondary | ICD-10-CM | POA: Insufficient documentation

## 2024-12-16 DIAGNOSIS — E1122 Type 2 diabetes mellitus with diabetic chronic kidney disease: Secondary | ICD-10-CM | POA: Diagnosis not present

## 2024-12-16 DIAGNOSIS — I509 Heart failure, unspecified: Secondary | ICD-10-CM | POA: Diagnosis not present

## 2024-12-16 DIAGNOSIS — I2510 Atherosclerotic heart disease of native coronary artery without angina pectoris: Secondary | ICD-10-CM | POA: Diagnosis not present

## 2024-12-16 DIAGNOSIS — M549 Dorsalgia, unspecified: Secondary | ICD-10-CM | POA: Diagnosis not present

## 2024-12-16 DIAGNOSIS — R1010 Upper abdominal pain, unspecified: Secondary | ICD-10-CM | POA: Diagnosis not present

## 2024-12-16 DIAGNOSIS — F32A Depression, unspecified: Secondary | ICD-10-CM | POA: Diagnosis not present

## 2024-12-16 DIAGNOSIS — R079 Chest pain, unspecified: Secondary | ICD-10-CM

## 2024-12-16 DIAGNOSIS — I5043 Acute on chronic combined systolic (congestive) and diastolic (congestive) heart failure: Secondary | ICD-10-CM | POA: Diagnosis not present

## 2024-12-16 LAB — BASIC METABOLIC PANEL WITH GFR
Anion gap: 12 (ref 5–15)
BUN: 21 mg/dL (ref 8–23)
CO2: 24 mmol/L (ref 22–32)
Calcium: 9.5 mg/dL (ref 8.9–10.3)
Chloride: 99 mmol/L (ref 98–111)
Creatinine, Ser: 1.54 mg/dL — ABNORMAL HIGH (ref 0.44–1.00)
GFR, Estimated: 35 mL/min — ABNORMAL LOW
Glucose, Bld: 150 mg/dL — ABNORMAL HIGH (ref 70–99)
Potassium: 3.8 mmol/L (ref 3.5–5.1)
Sodium: 135 mmol/L (ref 135–145)

## 2024-12-16 LAB — CBC
HCT: 38.2 % (ref 36.0–46.0)
Hemoglobin: 12.8 g/dL (ref 12.0–15.0)
MCH: 31.8 pg (ref 26.0–34.0)
MCHC: 33.5 g/dL (ref 30.0–36.0)
MCV: 94.8 fL (ref 80.0–100.0)
Platelets: 266 K/uL (ref 150–400)
RBC: 4.03 MIL/uL (ref 3.87–5.11)
RDW: 13.2 % (ref 11.5–15.5)
WBC: 9.9 K/uL (ref 4.0–10.5)
nRBC: 0 % (ref 0.0–0.2)

## 2024-12-16 LAB — PROTIME-INR
INR: 1.1 (ref 0.8–1.2)
Prothrombin Time: 14.7 s (ref 11.4–15.2)

## 2024-12-16 LAB — GLUCOSE, CAPILLARY
Glucose-Capillary: 137 mg/dL — ABNORMAL HIGH (ref 70–99)
Glucose-Capillary: 152 mg/dL — ABNORMAL HIGH (ref 70–99)
Glucose-Capillary: 162 mg/dL — ABNORMAL HIGH (ref 70–99)
Glucose-Capillary: 182 mg/dL — ABNORMAL HIGH (ref 70–99)

## 2024-12-16 LAB — HEPATITIS B SURFACE ANTIGEN: Hepatitis B Surface Ag: NONREACTIVE

## 2024-12-16 LAB — LIPID PANEL
Cholesterol: 122 mg/dL (ref 0–200)
HDL: 48 mg/dL
LDL Cholesterol: 43 mg/dL (ref 0–99)
Total CHOL/HDL Ratio: 2.5 ratio
Triglycerides: 153 mg/dL — ABNORMAL HIGH
VLDL: 31 mg/dL (ref 0–40)

## 2024-12-16 LAB — PREALBUMIN: Prealbumin: 19 mg/dL (ref 18–38)

## 2024-12-16 LAB — COOXEMETRY PANEL
Carboxyhemoglobin: 1.9 % — ABNORMAL HIGH (ref 0.5–1.5)
Carboxyhemoglobin: 1.6 % — ABNORMAL HIGH (ref 0.5–1.5)
Methemoglobin: 1.1 % (ref 0.0–1.5)
Methemoglobin: 1.1 % (ref 0.0–1.5)
O2 Saturation: 60.7 %
O2 Saturation: 63.6 %
Total hemoglobin: 12.7 g/dL (ref 12.0–16.0)
Total hemoglobin: 11.8 g/dL — ABNORMAL LOW (ref 12.0–16.0)

## 2024-12-16 LAB — HEPATITIS B SURFACE ANTIBODY,QUALITATIVE: Hep B S Ab: NONREACTIVE

## 2024-12-16 LAB — LACTATE DEHYDROGENASE: LDH: 324 U/L — ABNORMAL HIGH (ref 105–235)

## 2024-12-16 LAB — LACTIC ACID, PLASMA: Lactic Acid, Venous: 0.8 mmol/L (ref 0.5–1.9)

## 2024-12-16 LAB — URIC ACID: Uric Acid, Serum: 7.1 mg/dL (ref 2.5–7.1)

## 2024-12-16 LAB — APTT: aPTT: 34 s (ref 24–36)

## 2024-12-16 LAB — ANTITHROMBIN III: AntiThromb III Func: 95 % (ref 75–120)

## 2024-12-16 LAB — HEPATITIS C ANTIBODY: HCV Ab: NONREACTIVE

## 2024-12-16 LAB — HIV ANTIBODY (ROUTINE TESTING W REFLEX): HIV Screen 4th Generation wRfx: NONREACTIVE

## 2024-12-16 LAB — DIGOXIN LEVEL: Digoxin Level: 0.6 ng/mL — ABNORMAL LOW (ref 0.8–2.0)

## 2024-12-16 MED ORDER — SPIRONOLACTONE 12.5 MG HALF TABLET
12.5000 mg | ORAL_TABLET | Freq: Every day | ORAL | Status: DC
  Administered 2024-12-16 – 2024-12-21 (×6): 12.5 mg via ORAL
  Filled 2024-12-16 (×6): qty 1

## 2024-12-16 MED ORDER — HYDROMORPHONE HCL 2 MG PO TABS
1.0000 mg | ORAL_TABLET | ORAL | Status: DC | PRN
  Administered 2024-12-21 (×2): 1 mg via ORAL
  Filled 2024-12-16 (×3): qty 1

## 2024-12-16 MED ORDER — SENNA 8.6 MG PO TABS
1.0000 | ORAL_TABLET | Freq: Every day | ORAL | Status: DC
  Administered 2024-12-16: 8.6 mg via ORAL
  Filled 2024-12-16 (×2): qty 1

## 2024-12-16 MED ORDER — ACETAMINOPHEN 325 MG PO TABS
650.0000 mg | ORAL_TABLET | Freq: Four times a day (QID) | ORAL | Status: DC
  Administered 2024-12-16 – 2024-12-19 (×11): 650 mg via ORAL
  Filled 2024-12-16 (×13): qty 2

## 2024-12-16 MED ORDER — DICYCLOMINE HCL 10 MG/5ML PO SOLN
15.0000 mg | Freq: Three times a day (TID) | ORAL | Status: DC
  Administered 2024-12-16 – 2024-12-19 (×12): 15 mg via ORAL
  Filled 2024-12-16 (×13): qty 7.5

## 2024-12-16 MED ORDER — POLYETHYLENE GLYCOL 3350 17 G PO PACK
17.0000 g | PACK | Freq: Every day | ORAL | Status: DC
  Administered 2024-12-16: 17 g via ORAL
  Filled 2024-12-16 (×2): qty 1

## 2024-12-16 MED ORDER — ALUM & MAG HYDROXIDE-SIMETH 200-200-20 MG/5ML PO SUSP
15.0000 mL | Freq: Two times a day (BID) | ORAL | Status: DC
  Administered 2024-12-16 – 2024-12-22 (×10): 15 mL via ORAL
  Filled 2024-12-16 (×13): qty 30

## 2024-12-16 MED ORDER — ROSUVASTATIN CALCIUM 20 MG PO TABS
40.0000 mg | ORAL_TABLET | Freq: Every day | ORAL | Status: DC
  Administered 2024-12-16 – 2024-12-22 (×7): 40 mg via ORAL
  Filled 2024-12-16 (×7): qty 2

## 2024-12-16 MED ORDER — MORPHINE SULFATE 15 MG PO TABS: 15.0000 mg | ORAL_TABLET | ORAL | Status: DC | PRN

## 2024-12-16 MED ORDER — DIGOXIN 125 MCG PO TABS: 0.0625 mg | ORAL_TABLET | ORAL | Status: DC

## 2024-12-16 NOTE — Evaluation (Addendum)
 Occupational Therapy Evaluation Patient Details Name: Karen Warner MRN: [id number] DOB: [birth year] Today's Date: 12/16/2024   History of Present Illness   74 yo F presented to hospital with anterior chest pain. She states this pain is similar to her previous CAD episodes. Underwent heart cath 12/15/2024 and now advanced heart failure and LVAD coordinators consulted. PHMx:CAD, COPD (quit smoking 2021). Ischemic CM with EF < 20%, ICD placed. CHF, chronic low back pain, CKD3, DM2, DDD-cervical, fibromyalgia, MI, peripheral neuropathy, PVD, retinopathy and glaucoma     Clinical Impressions This 74 yo female admitted with above presents to acute OT with PLOF of being independent with basic ADLs and some IADLs ambulating without AD and living with family. Currently she is very weak and is setup-Mod A for basic ADLs and min A with ambulation with pushing IV pole. She will continue to benefit from acute OT with follow up depending on decision on LVAD.      If plan is discharge home, recommend the following:   A little help with walking and/or transfers;A little help with bathing/dressing/bathroom;Assistance with cooking/housework;Assist for transportation;Help with stairs or ramp for entrance     Functional Status Assessment   Patient has had a recent decline in their functional status and demonstrates the ability to make significant improvements in function in a reasonable and predictable amount of time.     Equipment Recommendations   Other (comment) (TBD)      Precautions/Restrictions   Precautions Precautions: Fall Recall of Precautions/Restrictions: Intact Restrictions Weight Bearing Restrictions Per Provider Order: No     Mobility Bed Mobility Overal bed mobility: Needs Assistance Bed Mobility: Supine to Sit, Sit to Supine     Supine to sit: Contact guard, HOB elevated, Used rails Sit to supine: Contact guard assist, HOB elevated, Used rails         Transfers Overall transfer level: Needs assistance Equipment used:  (IV pole) Transfers: Sit to/from Stand Sit to Stand: Min assist                  Balance Overall balance assessment: Needs assistance Sitting-balance support: No upper extremity supported, Feet supported Sitting balance-Leahy Scale: Good     Standing balance support: Single extremity supported Standing balance-Leahy Scale: Poor                             ADL either performed or assessed with clinical judgement   ADL Overall ADL's : Needs assistance/impaired Eating/Feeding: Independent;Sitting   Grooming: Set up;Sitting   Upper Body Bathing: Set up;Sitting   Lower Body Bathing: Minimal assistance;Sit to/from stand   Upper Body Dressing : Set up;Sitting   Lower Body Dressing: Minimal assistance;Sit to/from stand   Toilet Transfer: Minimal assistance;AmbulationSimulated bed>door and back x2>bed   Toileting- Clothing Manipulation and Hygiene: Minimal assistance;Sit to/from stand               Vision Patient Visual Report: No change from baseline              Pertinent Vitals/Pain Pain Assessment Pain Assessment: No/denies pain     Extremity/Trunk Assessment Upper Extremity Assessment Upper Extremity Assessment: Overall WFL for tasks assessed           Communication Communication Communication: No apparent difficulties   Cognition Arousal: Alert Behavior During Therapy: WFL for tasks assessed/performed Cognition: No apparent impairments  Following commands: Intact       Cueing    Cueing Techniques: Verbal cues              Home Living Family/patient expects to be discharged to:: Private residence Living Arrangements: Children;Other relatives Available Help at Discharge: Family;Available PRN/intermittently Type of Home: House Home Access: Stairs to enter [employer] of Steps: 6 Entrance  Stairs-Rails: Left;Right;Can reach both Home Layout: One level     Bathroom Shower/Tub: Walk-in shower;Door   [employer] Toilet: Standard     Home Equipment: Agricultural Consultant (2 wheels);Rollator (4 wheels);Shower seat          Prior Functioning/Environment Prior Level of Function : Independent/Modified Independent                    OT Problem List: Decreased strength;Decreased activity tolerance;Impaired balance (sitting and/or standing)   OT Treatment/Interventions: Self-care/ADL training;DME and/or AE instruction;Balance training;Patient/family education      OT Goals(Current goals can be found in the care plan section)   Acute Rehab OT Goals Patient Stated Goal: to talk to doctors on Monday with family OT Goal Formulation: With patient Time For Goal Achievement: 12/30/24 Potential to Achieve Goals: Good   OT Frequency:  Min 2X/week       AM-PAC OT 6 Clicks Daily Activity     Outcome Measure Help from another person eating meals?: None Help from another person taking care of personal grooming?: A Little Help from another person toileting, which includes using toliet, bedpan, or urinal?: A Little Help from another person bathing (including washing, rinsing, drying)?: A Little Help from another person to put on and taking off regular upper body clothing?: A Little Help from another person to put on and taking off regular lower body clothing?: A Little 6 Click Score: 19   End of Session Equipment Utilized During Treatment: Gait belt (pushing IV pole)  Activity Tolerance: Patient limited by fatigue Patient left: in bed;with call bell/phone within reach;with bed alarm set  OT Visit Diagnosis: Unsteadiness on feet (R26.81);Muscle weakness (generalized) (M62.81)                Time: 8584-8562 OT Time Calculation (min): 22 min Charges:  OT General Charges $OT Visit: 1 Visit OT Evaluation $OT Eval Moderate Complexity: 1 Mod  Cathy L. OT Acute  Rehabilitation Services Office [phone]    Karen Warner 12/16/2024, 3:33 PM

## 2024-12-16 NOTE — Plan of Care (Signed)
" °  Problem: Coping: Goal: Ability to adjust to condition or change in health will improve Outcome: Progressing   Problem: Education: Goal: Understanding of CV disease, CV risk reduction, and recovery process will improve Outcome: Progressing   "

## 2024-12-16 NOTE — Progress Notes (Signed)
 MCS EDUCATION NOTE:                VAD evaluation consent reviewed and signed by Karen Warner.  Initial VAD teaching completed with pt and caregiver.   VAD educational packet including Understanding Your Options with Advanced Heart Failure, [place] Patient Agreement for VAD Evaluation and Potential Implantation consent, and Abbott Heartmate 3 Left Ventricular Device (LVAD) Patient Guide, Heartmate 3 Left Ventricular Assist System Patient Education Program DVD, [place] HM III Patient Education, [place] Mechanical Circulatory Support Program, and Decision Aids for Left Ventricular Assist Device reviewed in detail and left at bedside for continued reference.   All questions answered regarding VAD implant, hospital stay, and what to expect when discharged home living with a heart pump.   Pt was unable to verbalize who would be her caregiver. She currently lives with her SO but she is apprehensive about him being in this role. We are meeting with her family on Monday. Explained need for 24/7 care when pt is discharged home due to sternal precautions, adaptation to living on support, emotional support, consistent and meticulous exit site care and management, medication adherence and high volume of follow up visits with the VAD Clinic after discharge; both pt and caregiver verbalized understanding of above.   Explained that LVAD can be implanted for two indications in the setting of advanced left ventricular heart failure treatment:  Bridge to transplant - used for patients who cannot safely wait for heart transplant without this device.  Or    Destination therapy - used for patients until end of life or recovery of heart function.  Patient and caregiver(s) acknowledge that the indication at this point in time for LVAD therapy would be for DT due to age.   Provided brief equipment overview and demonstration with HeartMate III training loop including discussion on the following:   a)  mobile power unit b) system controller   c) universal magazine features editor   d) battery clips   e) Batteries   f)  Perc lock   g) Percutaneous lead   Demonstrated and discussed:  a) changing power source on system controller from tethered (MPU) to untethered (battery) mode   b) changing power source on system controller from untethered (battery) to tethered (MPU) mode   c) how to monitor battery life both on the system controller and on each individual battery   d) changing batteries   Reviewed and supplied a copy of home inspection check list stressing that only three pronged grounded power outlets can be used for VAD equipment. Karen Warner confirmed home has electrical outlets that will support the equipment along with access working telephone.  Identified the following lifestyle modifications while living on MCS:    1. No driving for at least three months and then only if doctor gives permission to do so.   2. No tub baths while pump implanted, and shower only when doctor gives permission.   3. No swimming or submersion in water while implanted with pump.   4. No contact sports or engaging in jumping activities.   5. Always have a backup controller, charged spare batteries, and battery clips nearby at all times in case of emergency.   6. Call the doctor or hospital contact person if any change in how the pump sounds, feels, or works.   7. Plan to sleep only when connected to the power module.   8. Do not sleep on your stomach.   9. Keep a backup  system controller, charged batteries, battery clips, and flashlight near you during sleep in case of electrical power outage.   10. Exit site care including dressing changes, monitoring for infection, and importance of keeping percutaneous lead stabilized at all times.     Extended the option to have one of our current patients and caregiver(s) come to talk with them about living on support to assist with decision making.   Reviewed pictures of VAD  drive line, site care, dressing changes, and drive line stabilization including securement attachment device and abdominal binder. Discussed with pt and family that they will be required to purchase dressing supplies as long as patient has the VAD in place.   She will also need to abide by sternal precautions with no lifting >10lbs, pushing, pulling and will need assistance with adapting to new life style with VAD equipment and care.   Intermacs patient survival statistics through September 2025 reviewed with patient and caregiver as follows:    The patient understands that from this discussion it does not mean that they will receive the device, but that depends on an extensive evaluation process. The patient is aware of the fact that if at anytime they want to stop the evaluation process they can.  All questions have been answered at this time and contact information was provided should they encounter any further questions. She is agreeable at this time to the evaluation process and will move forward.      Lauraine Ip, RN VAD Coordinator   Office: [phone] 24/7 VAD Pager: [phone]

## 2024-12-16 NOTE — Progress Notes (Signed)
 H&V Care Navigation CSW Progress Note  Outpatient Heart Failure CSW consulted by LVAD Team to meet with new LVAD consult.    CSW met with patient, patient SO Ronaldo), and stepdaughter at bedside.  Introduced self and role with the clinic.  Discussed briefly need for psychosocial assessment prior to LVAD procedure and what would be entailed.  Reviewed need for identified caregiver and what would be expected of a caregiver for someone with an LVAD.  Patient and family have no questions at this time.  Patient feeling somewhat overwhelmed by all the information and prospect of this surgery but has good support from family and seemed in good spirits overall.  CSW will plan to meet with patient and family on Monday to complete full assessment.  Andriette HILARIO Leech, LCSW Clinical Social Worker Advanced Heart Failure Clinic Desk#: [phone] Cell#: [phone]

## 2024-12-16 NOTE — TOC Initial Note (Signed)
 Transition of Care [facility]) - Initial/Assessment Note    Patient Details  Name: Karen Warner MRN: [id number] Date of Birth: 1951/01/08  Transition of Care [facility]) CM/SW Contact:    [name] Phone Number: [phone] 12/16/2024, 12:44 PM  Clinical Narrative:       12:14 PM- HF CSW attempted to meet with patient at bedside. Patient stated that she had a headache and asked if someone could come back at a later time.   HF CSW/CM will continue to follow and monitor for dc readiness.               Patient Goals and CMS Choice            Expected Discharge Plan and Services                                              Prior Living Arrangements/Services                       Activities of Daily Living      Permission Sought/Granted                  Emotional Assessment              Admission diagnosis:  Chest pain [R07.9] Chest pain, unspecified type [R07.9] Patient Active Problem List   Diagnosis Date Noted   Decompensated heart failure with reduced ejection fraction (HFrEF) (HCC) 12/16/2024   Acute on chronic combined systolic and diastolic CHF (congestive heart failure) (HCC) 12/15/2024   Diabetes mellitus (HCC) 12/14/2024   Medication management 03/03/2023   CAD in native artery 06/23/2022   Biventricular ICD (implantable cardioverter-defibrillator) in place 04/03/2021   Obstructive sleep apnea 03/15/2021   COPD (chronic obstructive pulmonary disease) (HCC)    Colon polyps    Chronic headaches    CHF (congestive heart failure) (HCC)    Asthma    Arthritis    Anemia in chronic kidney disease 02/19/2021   IBS (irritable bowel syndrome)    Syncope and collapse 12/26/2020   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   Orthostatic hypotension 11/21/2020   Daytime sleepiness 11/20/2020   Hypertensive heart disease with heart failure (HCC) 11/17/2020   Syncope 11/17/2020   Herpes simplex vulvovaginitis 11/02/2020    Snoring 10/17/2020   Depressed left ventricular ejection fraction 10/16/2020   Left bundle branch block 10/16/2020   Nasal congestion 09/30/2020   Coronary artery disease    Myocardial infarction [facility])    Unstable angina (HCC) 08/03/2020   Ischemic cardiomyopathy 08/03/2020   Non-ST elevation (NSTEMI) myocardial infarction (HCC) 08/03/2020   Obesity (BMI 30-39.9) 06/26/2020   Pustular rash 05/14/2020   Torticollis, acute 04/11/2020   Hordeolum externum of left upper eyelid 03/13/2020   Low vitamin B12 level 03/13/2020   Pain of right great toe 01/24/2020   Arthralgia 01/14/2020   Fibromyalgia affecting multiple sites 01/14/2020   Diarrhea of presumed infectious origin 01/07/2020   Ingrown nail 12/27/2019   History of anemia 12/12/2019   Low hemoglobin 12/12/2019   Low vitamin D  level 12/12/2019   Anemia 12/12/2019   Long-term use of aspirin  therapy 11/15/2019   Left renal mass 11/08/2019   Depression 11/05/2019   Neuropathy 11/05/2019   Other fatigue 09/12/2019   Rhinosinusitis 09/12/2019   Hypertensive emergency 08/06/2019   Left ventricular dysfunction 05/31/2019  LV dysfunction 05/31/2019   Trochanteric bursitis of right hip 04/20/2019   Aftercare following surgery 04/12/2019   Injury of toenail of left foot 04/06/2019   Moderate episode of recurrent major depressive disorder (HCC) 03/29/2019   Diabetic polyneuropathy associated with type 2 diabetes mellitus (HCC) 02/24/2019   History of disseminated herpes simplex 02/22/2019   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 02/22/2019   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) 02/22/2019   Seasonal allergies 02/22/2019   Preoperative cardiovascular examination 01/23/2019   Vitamin D  deficiency 01/23/2019   Chronic bilateral low back pain with bilateral sciatica 01/13/2019   DDD (degenerative disc disease), cervical 01/13/2019   Gastro-esophageal reflux disease without esophagitis 01/13/2019   History of intestinal  surgery 01/13/2019   Iron deficiency anemia 01/13/2019   Mixed hyperlipidemia 01/13/2019   Tobacco abuse 01/13/2019   Leukocytosis 05/28/2018   Diverticulitis 05/20/2018   Chronic obstructive pulmonary disease, unspecified (HCC) 03/18/2018   Diverticulitis of colon 03/18/2018   PVD (peripheral vascular disease) 04/08/2017   Chest pain 04/07/2017   Malignant essential hypertension 01/22/2016   Abdominal pain 12/22/2014   Chronic idiopathic constipation 12/22/2014   Chronic bladder pain 11/08/2014   Glaucoma 11/08/2014   H/O vaginal hysterectomy 11/08/2014   Hypertension 11/08/2014   Migraine, unspecified, not intractable, without status migrainosus 11/08/2014   Nocturia 11/08/2014   Other premature beats 11/08/2014   Urge incontinence 11/08/2014   Urinary urgency 11/08/2014   Vaginal atrophy 11/08/2014   Atherosclerotic heart disease of native coronary artery without angina pectoris 04/13/2014   Type 2 diabetes mellitus with stage 3b chronic kidney disease, with long-term current use of insulin  (HCC) 12/08/2013   Family history of ischemic heart disease (IHD) 08/16/2013   Ptosis 04/19/2012   PCP:  Silver Lamar LABOR, MD Pharmacy:   [facility] [phone] [place], [place] - [address] AT [facility] [address] [place] [address] Phone: [phone] Fax: [phone]     Social Drivers of Health (SDOH) Social History: SDOH Screenings   Food Insecurity: No Food Insecurity (12/15/2024)  Housing: Low Risk (12/07/2024)   Received from [facility]  Transportation Needs: No Transportation Needs (12/07/2024)   Received from [facility]  Utilities: Not At Risk (12/07/2024)   Received from [facility]  Depression (PHQ2-9): Low Risk (05/26/2024)  Financial Resource Strain: Low Risk (12/07/2024)   Received from [facility]  Tobacco Use: Medium Risk (12/14/2024)   SDOH Interventions:     Readmission Risk Interventions      No data to display

## 2024-12-16 NOTE — Progress Notes (Signed)
 "  HD#1 SUBJECTIVE:  Patient Summary: Karen Warner is a 74 y.o. with a pertinent PMH of CAD, pacemaker placement, [place] stimulator, ischemic cardiomyopathy, COPD, fibromyalgia, CKD who presented with CP and admitted for cardiac cath. Cardiac cath revealed low cardiac output with low filing pressures and stable coronary anatomy. Advanced Heart Failure onboard and LVAD coordinators consulted.   Overnight Events: miralax  and senna ordered for constipation   Interim History: Today patient is sitting up in bed. She reports continued pain in her mid-sternal chest region as well as her abdomen which is unchanged from the past couple of days. When asked if she has a good understanding about her overall condition, she states no, she wasn't aware yet in regards to future plans for her heart care yet. She does have family that is easily reachable by phone, but is unsure if any of them will come by in person today.   OBJECTIVE:  Vital Signs: Vitals:   12/15/24 1800 12/15/24 1925 12/15/24 2355 12/16/24 0414  BP: (!) 143/75 (!) 142/77 94/64 95/60   Pulse: 92 97  96  Resp: 14 19 19 19   Temp:  98.4 F (36.9 C) 98.3 F (36.8 C) 97.8 F (36.6 C)  TempSrc: Oral Oral Oral Oral  SpO2: 95% 95%  95%  Weight:      Height:       Supplemental O2: Room Air SpO2: 95 %  Filed Weights   12/14/24 2245  Weight: 76.3 kg     Intake/Output Summary (Last 24 hours) at 12/16/2024 9390 Last data filed at 12/16/2024 0300 Gross per 24 hour  Intake 648 ml  Output --  Net 648 ml   Net IO Since Admission: 648 mL [12/16/24 0609]  Physical Exam: Physical Exam Constitutional:      General: She is not in acute distress.    Appearance: She is not ill-appearing, toxic-appearing or diaphoretic.  Cardiovascular:     Heart sounds: Normal heart sounds. No murmur heard.    No friction rub. No gallop.  Pulmonary:     Effort: Pulmonary effort is normal. No respiratory distress.     Breath sounds: Normal  breath sounds. No decreased breath sounds, wheezing, rhonchi or rales.  Abdominal:     General: Bowel sounds are normal.     Palpations: Abdomen is soft.     Tenderness: There is abdominal tenderness (generalized). There is no guarding.  Musculoskeletal:     Right lower leg: No edema.     Left lower leg: No edema.  Skin:    General: Skin is warm and dry.  Neurological:     Mental Status: She is alert.     Patient Lines/Drains/Airways Status     Active Line/Drains/Airways     Name Placement date Placement time Site Days   Peripheral IV 12/14/24 20 G 1 Left Antecubital 12/14/24  0800  Antecubital  2   Peripheral IV 12/15/24 20 G Right Antecubital 12/15/24  0723  Antecubital  1   PICC Double Lumen 12/15/24 Right Basilic 37 cm 0 cm 12/15/24  8468  -- 1            Pertinent labs and imaging:      Latest Ref Rng & Units 12/16/2024    3:34 AM 12/15/2024   11:25 AM 12/15/2024    4:33 AM  CBC  WBC 4.0 - 10.5 K/uL 9.9   10.0   Hemoglobin 12.0 - 15.0 g/dL 87.1  86.6    87.0  12.7  Hematocrit 36.0 - 46.0 % 38.2  39.0    38.0  38.4   Platelets 150 - 400 K/uL 266   256        Latest Ref Rng & Units 12/16/2024    3:34 AM 12/15/2024   11:25 AM 12/15/2024    4:33 AM  CMP  Glucose 70 - 99 mg/dL 849   852   BUN 8 - 23 mg/dL 21   26   Creatinine 9.55 - 1.00 mg/dL 8.45   8.07   Sodium 864 - 145 mmol/L 135  139    141  137   Potassium 3.5 - 5.1 mmol/L 3.8  4.0    3.9  4.4   Chloride 98 - 111 mmol/L 99   100   CO2 22 - 32 mmol/L 24   26   Calcium  8.9 - 10.3 mg/dL 9.5   9.2     CARDIAC CATHETERIZATION Result Date: 12/15/2024   Dist LAD lesion is 40% stenosed.   Ost Cx to Prox Cx lesion is 30% stenosed.   2nd Diag lesion is 70% stenosed.   2nd Mrg lesion is 40% stenosed.   LPAV lesion is 40% stenosed.   Ost LAD lesion is 40% stenosed.   1st Diag lesion is 60% stenosed.   Non-stenotic Mid LAD lesion was previously treated. 1. Nonobstructive coronary disease.  30 cc contrast use. 2.  Low filling pressures. 3. Low cardiac output by Fick and thermodilution.   US  EKG SITE RITE Result Date: 12/15/2024 If Site Rite image not attached, placement could not be confirmed due to current cardiac rhythm.   ASSESSMENT/PLAN:  Assessment: Principal Problem:   Chest pain Active Problems:   Diabetes mellitus (HCC)   Acute on chronic combined systolic and diastolic CHF (congestive heart failure) (HCC)   Plan: #Acute on Chronic HF --> Low output HF CAD Ischemic cardiomyopathy with [employer] CRT-D device and Barostim device  Chest Pain  L and R Heart Cath 12/15/2024: low cardiac output with low filling pressures. Stable coronary anatomy. Low output HF. Advanced HF team consulting. LVAD coordinators consulted and will speak again with patient on 1/19. PICC line in place for CVP and co-ox. Last CVP 2. Will observe on milrinone  over the weekend. Chest pain reproducible on Physical exam, possibly MSK. Patient not a heart transplant candidate. - f/u LVAD consult  - appreciate cardiology recommendations:   - milrinone  infusion 0.25 mcg/kg/min  - continue Jardiance  10 mg daily   - continue ASA 81 mg  - continue Ezetimibe  10 mg and Crestor  40 mg daily, goal <55   - continue continue Imdur  15 mg daily   - holding Plavix  with LVAD consideration   - holding diuretics given low filling pressures   - holding coreg  given low output state   - holding losartan  and spironolactone  given AKI   - holding digoxin  given elevated level; may restart at 0.0625 mg every other day once renal function stabilizes. (See below)   - repeat digoxin  level ordered  - strict I/O and daily weights Recommendations for CP  - continue Ranolazine  500 mg BID  - continue nitroglycerin  PRN  #Abdominal Pain  RUQ US  and CT abdomen/pelvic notes GB sludge but no acute cholecystitis. LFTs unremarkable on admission. Willl repeat CMP tomorrow. Symptoms may be related to low output HF however has not been improving with  cardiac interventions. Patient requested that dicyclomine  be increased. There are ADRs with increase mostly anti-cholinergic, however can also be associated with dizziness, palpitations,  or arrhythmia. Will increase to 15 mg but closely monitor.  - continue protonix  40 mg daily  - continue bentyl  15 mg TID - continue maalox/mylanta q 4 hours PRN - closely monitor for anticholinergic symptoms associated with bentyl  increase.   #AKI on CKD Baseline Cr around 1.5. Current: 1.54. Likely cardiorenal in setting of low output HF - monitor daily BMP - avoid nephrotoxic medications (see above) - renally dose medications   Chronic pain/fibromyalgia  Chest, neck, back pain reproducible on exam. Mostly R>L. No signs of infection  - continue Tylenol  PRN - continue Flexeril  5 mg TID PRN - continue lidocaine  patch - continue voltaren  gel   Type 2 DM Last A1C: 6.3 (12/9) - continue lantus  25 U BID, consider increase with restarted diet  - continue SSI - continue jardiance  10 mg daily   Depression - continue trintellix  5 mg nightly   COPD -albuterol  PRN   Other home medications ordered - vitamin B12, folic acid , vit D ordered - valcyclovir 500 mg daily ordered   Best Practice: Diet: cardiac diet VTE: heparin  injection 5,000 Units Start: 12/15/24 2200 SCD's Start: 12/15/24 2122 Code: Full  Disposition planning: Therapy Recs: None, DME: none DISPO: Anticipated discharge pending clinical improvement.   Signature:  Sallyanne Benuel [facility]  6:09 AM, 12/16/2024  On Call pager [phone]  "

## 2024-12-16 NOTE — Consult Note (Addendum)
 "   Consultation  Referring Provider: Cardiology/McLean Primary Care Physician:  Silver Lamar LABOR, MD Primary Gastroenterologist:  Dr. Shila  Reason for Consultation: Chest pain/abdominal pain  HPI: Karen Warner is a 74 y.o. female with history of coronary artery disease and ischemic cardiomyopathy with EF less than 20%, and COPD.  She is status post PCI and stent to LAD 2020.  She has had a cardiomyopathy over the past 4 to 5 years.. Most recent echo December 2025 EF less than 20%/right ventricle normal.  She is status post barrow stimulation activation therapy device implant February 2025 and is status post previous ICD. She had been seen in the heart failure clinic earlier this week felt to be mildly volume overloaded instructed to increase her torsemide , but ultimately came to the emergency room on 12/15/2023 due to chest pain that was unrelieved by nitroglycerin .  She is now on milrinone , and is undergoing workup for LVAD.  Patient reports that she has had chest pain that comes and goes long-term usually relieved by nitroglycerin .  She says she does not take nitroglycerin  every day though she usually will have some chest pain every day because she has problems with headaches which are exacerbated by the nitroglycerin  use.  She describes her pain as a tight belt or band like feeling below her ribs, across her abdomen and into her back.  This will occur at rest and with exertion, sometimes associated with shortness of breath but not always, usually no diaphoresis.  She does not have any issues with nausea and vomiting, usually able to eat without difficulty but does say after she finishes a meal at times she will develop an abdominal bloated type feeling that she has had over the past several years for which she takes dicyclomine .  She says generally this is helpful.  She has not noticed any change in her bowel habits, no melena or hematochezia.  She denies any specific right upper  quadrant pain postprandially to me. He is on a PPI at home, takes Pepcid  as needed as well and the dicyclomine .  She had undergone EGD and colonoscopy by Dr. Larene in 2020 for iron deficiency anemia, at EGD she had a small hiatal hernia and an asymptomatic distal esophageal stricture not dilated had a colonoscopy is status post left hemicolectomy for diverticulitis previous and had a couple of diminutive polyps removed and noted to have mild diverticulosis. She had recently been seen in our office to discuss follow-up colonoscopy.  Due to her severe cardiac disease follow-up procedures have been deferred. Per those notes she had also started Ozempic last fall.  Abdominal ultrasound since admission shows gallbladder sludge but no stones, no gallbladder wall thickening, no ductal dilation. CT abdomen and pelvis was done noncontrasted and shows the anastomosis in the proximal rectum, no other bowel abnormalities, she has a new left renal lesion 1.9 cm recommended MRI.  Labs 12/16/2023 WBC 10.0/hemoglobin 12.7/hematocrit 38.4/platelets 256 Potassium 4.4/BUN 26/creatinine 1.92 Lactate within normal limits Lipase on admission normal LFTs normal on admission  Past Medical History:  Diagnosis Date   AICD (automatic cardioverter/defibrillator) present    Anemia 12/12/2019   Anginal pain    Arthralgia 01/14/2020   Arthritis    Asthma    Atherosclerotic heart disease of native coronary artery without angina pectoris 04/13/2014   CHF (congestive heart failure) (HCC)    Chronic bilateral low back pain with bilateral sciatica 01/13/2019   Chronic bladder pain 11/08/2014   Last Assessment & Plan:  Formatting of this note might be different from the original. Patient has chronic pain syndrome in general I think this is unfortunately worsening her pelvic pain and bladder pain her examination today was very reassuring I am advised her to use the estrogen cream I did start her on Elavil 10 milligrams  at that time if she does tolerated will increase it to 25 milligrams.    Chronic headaches    Chronic idiopathic constipation 12/22/2014   Formatting of this note might be different from the original. Last Assessment & Plan:  For better bowel emptying please use either Citrucel / Benefiber start with 2 tablespoons daily and titrate up or down to effect.  Also please use glycerin  suppositories as needed to assist in evacuation. Last Assessment & Plan:  Formatting of this note might be different from the original. For better bowel empt   Chronic obstructive pulmonary disease, unspecified (HCC) 03/18/2018   CKD (chronic kidney disease) stage 3, GFR 30-59 ml/min (HCC)    Class 1 obesity due to excess calories with serious comorbidity and body mass index (BMI) of 31.0 to 31.9 in adult 06/26/2020   Colon polyps    COPD (chronic obstructive pulmonary disease) (HCC)    Coronary artery disease    DDD (degenerative disc disease), cervical 01/13/2019   Depression 11/05/2019   Diabetic polyneuropathy associated with type 2 diabetes mellitus (HCC) 02/24/2019   Diverticulitis 05/20/2018   Diverticulitis of colon 03/18/2018   Family history of ischemic heart disease (IHD) 08/16/2013   Fibromyalgia    Fibromyalgia affecting multiple sites 01/14/2020   Gastro-esophageal reflux disease without esophagitis 01/13/2019   Glaucoma 11/08/2014   Hordeolum externum of left upper eyelid 03/13/2020   Hypertension 11/08/2014   IBS (irritable bowel syndrome)    Iron deficiency anemia 01/13/2019   Left renal mass 11/08/2019   Leukocytosis 05/28/2018   Low vitamin B12 level 03/13/2020   Low vitamin D  level 12/12/2019   LV dysfunction 05/31/2019   Malignant essential hypertension 01/22/2016   Migraine, unspecified, not intractable, without status migrainosus 11/08/2014   Mixed hyperlipidemia 01/13/2019   Myocardial infarction (HCC)    Other premature beats 11/08/2014   Peripheral neuropathy due to metabolic  disorder 02/22/2019   PVD (peripheral vascular disease) 04/08/2017   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 Last Assessment & Plan:  For vaginal atrophy please place a pea size dab of Estrogen vaginal cream  into the vagina 3 times a week ( Monday, Wednesday, Friday) Last Assessment & Plan:  Formatting of this note might be different from the original. For vaginal atrophy please place a pea size dab of Estrogen vaginal cream  into the vagina     Past Surgical History:  Procedure Laterality Date   ABDOMINAL HYSTERECTOMY     BIV ICD INSERTION CRT-D N/A 12/31/2020   Procedure: BIV ICD INSERTION CRT-D;  Surgeon: Waddell Danelle ORN, MD;  Location: [facility] INVASIVE CV LAB;  Service: Cardiovascular;  Laterality: N/A;   BLEPHAROPLASTY Bilateral    COLON SURGERY     CORONARY ANGIOPLASTY WITH STENT PLACEMENT  2020   X3   CORONARY PRESSURE/FFR STUDY N/A 08/03/2020   Procedure: INTRAVASCULAR PRESSURE WIRE/FFR STUDY;  Surgeon: Wonda Sharper, MD;  Location: [facility] INVASIVE  CV LAB;  Service: Cardiovascular;  Laterality: N/A;   CORONARY STENT INTERVENTION N/A 08/03/2020   Procedure: CORONARY STENT INTERVENTION;  Surgeon: Wonda Sharper, MD;  Location: [facility] INVASIVE CV LAB;  Service: Cardiovascular;  Laterality: N/A;   LEFT HEART CATH AND CORONARY ANGIOGRAPHY N/A 08/03/2020   Procedure: LEFT HEART CATH AND CORONARY ANGIOGRAPHY;  Surgeon: Wonda Sharper, MD;  Location: [facility] INVASIVE CV LAB;  Service: Cardiovascular;  Laterality: N/A;   RADIOFREQUENCY ABLATION  07/2023   REFRACTIVE SURGERY Left    [place]    RIGHT/LEFT HEART CATH AND CORONARY ANGIOGRAPHY N/A 10/13/2023   Procedure: RIGHT/LEFT HEART CATH AND CORONARY ANGIOGRAPHY;  Surgeon: Rolan Ezra RAMAN, MD;  Location: [facility] INVASIVE CV LAB;  Service: Cardiovascular;  Laterality: N/A;   RIGHT/LEFT HEART CATH AND CORONARY ANGIOGRAPHY N/A 12/15/2024   Procedure: RIGHT/LEFT HEART CATH AND CORONARY ANGIOGRAPHY;  Surgeon: Rolan Ezra RAMAN, MD;  Location: [facility] INVASIVE CV LAB;  Service: Cardiovascular;  Laterality: N/A;    Prior to Admission medications  Medication Sig Start Date End Date Taking? Authorizing Provider  acetaminophen  (TYLENOL ) 650 MG CR tablet Take 650 mg by mouth every 8 (eight) hours as needed for pain.   Yes [provider]  acetaminophen -codeine (TYLENOL  #3) 300-30 MG tablet Take 1 tablet by mouth every 6 (six) hours as needed for moderate pain (pain score 4-6).   Yes [provider]  albuterol  (PROVENTIL ) (2.5 MG/3ML) 0.083% nebulizer solution Take 2.5 mg by nebulization every 4 (four) hours as needed for wheezing or shortness of breath. 01/26/18  Yes [provider]  aspirin  81 MG EC tablet Take 81 mg by mouth daily.   Yes [provider]  carvedilol  (COREG ) 3.125 MG tablet Take 1 tablet (3.125 mg total) by mouth 2 (two) times daily with a meal. 09/01/24  Yes Tobb, Kardie, DO  clopidogrel  (PLAVIX ) 75 MG tablet Take 1 tablet (75 mg total) by mouth daily. TAKE 1 TABLET(75 MG) BY MOUTH DAILY 07/07/24  Yes Lesia Sharper Barter, PA-C  cyclobenzaprine  (FLEXERIL ) 5 MG tablet Take 5 mg by mouth 3 (three) times daily as needed for muscle spasms. 11/04/24  Yes [provider]  dapagliflozin  propanediol (FARXIGA ) 10 MG TABS tablet TAKE 1 TABLET(10 MG) BY MOUTH DAILY BEFORE BREAKFAST 07/01/23  Yes Tobb, Kardie, DO  dicyclomine  (BENTYL ) 10 MG capsule TAKE 1 CAPSULE(10 MG) BY MOUTH IN THE MORNING AND AT BEDTIME 11/16/24  Yes Nandigam, Kavitha V, MD  digoxin  (LANOXIN ) 0.125 MG tablet TAKE ONE-HALF TABLET BY MOUTH EVERY DAY 06/23/24  Yes Rolan Ezra RAMAN, MD  ezetimibe  (ZETIA ) 10 MG tablet Take 1 tablet (10 mg total) by mouth daily. 05/25/24 09/01/25 Yes  Rolan Ezra RAMAN, MD  famotidine  (PEPCID ) 40 MG tablet TAKE 1 TABLET(40 MG) BY MOUTH TWICE DAILY Patient taking differently: Take 40 mg by mouth daily. 02/06/23  Yes Beather Delon Gibson, PA  folic acid  (FOLVITE ) 1 MG tablet Take 1 mg by mouth daily. 05/29/21  Yes [provider]  insulin  lispro (HUMALOG) 100 UNIT/ML KwikPen Inject 2-20 Units into the skin in the morning and at bedtime. per sliding scale 09/22/16  Yes [provider]  isosorbide  mononitrate (IMDUR ) 30 MG 24 hr tablet Take 15 mg by mouth daily. 05/30/24  Yes [provider]  lidocaine  (LIDODERM ) 5 % Place 1 patch onto the skin every 12 (twelve) hours as needed (pain). Remove & Discard patch within 12 hours or as directed by MD   Yes [provider]  loratadine  (CLARITIN ) 10 MG tablet  Take 10 mg by mouth daily. 09/19/20  Yes [provider]  losartan  (COZAAR ) 25 MG tablet Take 0.5 tablets (12.5 mg total) by mouth daily. 05/04/24 09/01/25 Yes Milford, Harlene HERO, FNP  nitroGLYCERIN  (NITROSTAT ) 0.4 MG SL tablet DISSOLVE 1 TABLET UNDER TONGUE EVERY 5 MINUTES AS NEEDED FOR CHEST PAIN. 08/24/23  Yes Tobb, Kardie, DO  pantoprazole  (PROTONIX ) 40 MG tablet Take 1 tablet (40 mg total) by mouth daily. TAKE 1 TABLET(40 MG) BY MOUTH DAILY 07/07/24  Yes Lesia Ozell Barter, PA-C  Plecanatide  (TRULANCE ) 3 MG TABS Take 1 tablet (3 mg total) by mouth as needed. 11/09/24  Yes Beather Delon Gibson, PA  ranolazine  (RANEXA ) 500 MG 12 hr tablet Take 1 tablet (500 mg total) by mouth 2 (two) times daily. 05/11/24  Yes Tobb, Kardie, DO  rosuvastatin  (CRESTOR ) 40 MG tablet Take 40 mg by mouth at bedtime.   Yes [provider]  Semaglutide (OZEMPIC, 1 MG/DOSE, [place]) Inject 1 mg into the skin every 7 (seven) days.   Yes [provider]  spironolactone  (ALDACTONE ) 25 MG tablet Take 1 tablet (25 mg total) by mouth daily. 09/01/24  Yes Rolan Ezra RAMAN, MD  torsemide  (DEMADEX ) 20 MG tablet Take 1 tablet (20 mg  total) by mouth 2 (two) times daily. 12/13/24  Yes Milford, Jessica M, FNP  TRESIBA FLEXTOUCH 200 UNIT/ML FlexTouch Pen Inject 26 Units into the skin 2 (two) times daily. 06/29/20  Yes [provider]  TRINTELLIX  5 MG TABS tablet Take 5 mg by mouth at bedtime. 08/14/20  Yes [provider]  Ubrogepant  (UBRELVY ) 50 MG TABS Take 1 tablet by mouth daily as needed (headache).   Yes [provider]  valACYclovir  (VALTREX ) 500 MG tablet Take 500 mg by mouth daily. 04/10/23  Yes [provider]  vitamin B-12 (CYANOCOBALAMIN ) 1000 MCG tablet Take 1,000 mcg by mouth daily.   Yes [provider]  Vitamin D , Ergocalciferol , (DRISDOL ) 1.25 MG (50000 UNIT) CAPS capsule Take 50,000 Units by mouth every 7 (seven) days. sunday   Yes [provider]  Continuous Glucose Sensor (DEXCOM G7 SENSOR) MISC SMARTSIG:1 Topical Every 10 Days 03/12/23   [provider]  potassium chloride  SA (KLOR-CON  M) 20 MEQ tablet Take 1 tablet (20 mEq total) by mouth daily. Patient not taking: Reported on 12/14/2024 12/13/24   Glena Harlene HERO, FNP    Current Facility-Administered Medications  Medication Dose Route Frequency Provider Last Rate Last Admin   0.9 %  sodium chloride  infusion  250 mL Intravenous PRN McLean, Dalton S, MD       acetaminophen  (TYLENOL ) tablet 650 mg  650 mg Oral QID Nguyen, Diana, MD   650 mg at 12/16/24 1456   albuterol  (PROVENTIL ) (2.5 MG/3ML) 0.083% nebulizer solution 2.5 mg  2.5 mg Nebulization Q4H PRN Rihner, Emilie, DO       alum & mag hydroxide-simeth (MAALOX/MYLANTA) 200-200-20 MG/5ML suspension 15 mL  15 mL Oral BID Nguyen, Diana, MD   15 mL at 12/16/24 0932   aspirin  EC tablet 81 mg  81 mg Oral Daily Rihner, Emilie, DO   81 mg at 12/16/24 9073   Chlorhexidine  Gluconate Cloth 2 % PADS 6 each  6 each Topical Daily McLean, Dalton S, MD   6 each at 12/16/24 0933   cyanocobalamin  (VITAMIN B12) tablet 1,000 mcg  1,000 mcg Oral Daily Rihner, Emilie,  DO   1,000 mcg at 12/16/24 9073   cyclobenzaprine  (FLEXERIL ) tablet 5 mg  5 mg Oral TID PRN Rihner,  Emilie, DO   5 mg at 12/16/24 1456   diclofenac  Sodium (VOLTAREN ) 1 % topical gel 2 g  2 g Topical QID Rihner, Emilie, DO   2 g at 12/16/24 1457   dicyclomine  (BENTYL ) 10 MG/5ML solution 15 mg  15 mg Oral TID AC & HS Eben Reyes BROCKS, MD   15 mg at 12/16/24 1027   empagliflozin  (JARDIANCE ) tablet 10 mg  10 mg Oral Daily McLean, Dalton S, MD   10 mg at 12/16/24 9073   ezetimibe  (ZETIA ) tablet 10 mg  10 mg Oral Daily Rihner, Emilie, DO   10 mg at 12/16/24 9073   folic acid  (FOLVITE ) tablet 1 mg  1 mg Oral Daily Rihner, Emilie, DO   1 mg at 12/16/24 9077   heparin  injection 5,000 Units  5,000 Units Subcutaneous Q8H McLean, Dalton S, MD   5,000 Units at 12/16/24 1457   HYDROmorphone  (DILAUDID ) tablet 1 mg  1 mg Oral Q4H PRN Nguyen, Diana, MD       insulin  aspart (novoLOG ) injection 0-15 Units  0-15 Units Subcutaneous TID WC Rihner, Emilie, DO   3 Units at 12/16/24 1130   insulin  glargine (LANTUS ) injection 25 Units  25 Units Subcutaneous BID Rihner, Emilie, DO   25 Units at 12/16/24 9071   isosorbide  mononitrate (IMDUR ) 24 hr tablet 15 mg  15 mg Oral Daily Rihner, Emilie, DO   15 mg at 12/16/24 9077   lidocaine  (LIDODERM ) 5 % 1 patch  1 patch Transdermal Q24H Rihner, Emilie, DO   1 patch at 12/16/24 9072   loratadine  (CLARITIN ) tablet 10 mg  10 mg Oral Daily Rihner, Emilie, DO   10 mg at 12/16/24 0926   milrinone  (PRIMACOR ) 20 MG/100 ML (0.2 mg/mL) infusion  0.25 mcg/kg/min Intravenous Continuous Rolan Ezra RAMAN, MD 5.72 mL/hr at 12/16/24 0727 0.25 mcg/kg/min at 12/16/24 9272   nitroGLYCERIN  (NITROSTAT ) SL tablet 0.4 mg  0.4 mg Sublingual Q5 min PRN Rihner, Emilie, DO       ondansetron  (ZOFRAN ) injection 4 mg  4 mg Intravenous Q6H PRN McLean, Dalton S, MD       pantoprazole  (PROTONIX ) EC tablet 40 mg  40 mg Oral Daily Rihner, Emilie, DO   40 mg at 12/16/24 9073   polyethylene glycol (MIRALAX  /  GLYCOLAX ) packet 17 g  17 g Oral Daily Azadegan, Maryam, MD   17 g at 12/16/24 9072   ranolazine  (RANEXA ) 12 hr tablet 500 mg  500 mg Oral BID Rihner, Emilie, DO   500 mg at 12/16/24 9073   rosuvastatin  (CRESTOR ) tablet 40 mg  40 mg Oral QHS Simmons, Brittainy M, PA-C       senna (SENOKOT) tablet 8.6 mg  1 tablet Oral Daily Zheng, Michael, DO   8.6 mg at 12/16/24 9077   sodium chloride  flush (NS) 0.9 % injection 10-40 mL  10-40 mL Intracatheter Q12H Rolan Ezra RAMAN, MD   10 mL at 12/15/24 2151   sodium chloride  flush (NS) 0.9 % injection 10-40 mL  10-40 mL Intracatheter PRN Rolan Ezra RAMAN, MD       sodium chloride  flush (NS) 0.9 % injection 3 mL  3 mL Intravenous Q12H Rolan Ezra RAMAN, MD   3 mL at 12/16/24 0932   sodium chloride  flush (NS) 0.9 % injection 3 mL  3 mL Intravenous PRN McLean, Dalton S, MD       spironolactone  (ALDACTONE ) tablet 12.5 mg  12.5 mg Oral Daily McLean, Dalton S, MD   12.5 mg at  12/16/24 0926   valACYclovir  (VALTREX ) tablet 500 mg  500 mg Oral Daily Harrie Bruckner, DO   500 mg at 12/16/24 9072   [START ON 12/18/2024] Vitamin D  (Ergocalciferol ) (DRISDOL ) 1.25 MG (50000 UNIT) capsule 50,000 Units  50,000 Units Oral Q Sun Rihner, Emilie, DO       vortioxetine  HBr (TRINTELLIX ) tablet 5 mg  5 mg Oral QHS Rihner, Emilie, DO   5 mg at 12/15/24 2150    Allergies as of 12/14/2024 - Review Complete 12/14/2024  Allergen Reaction Noted   Bee venom Anaphylaxis 02/01/2018   Sumatriptan Shortness Of Breath 11/02/2020   Amoxicillin-pot clavulanate Diarrhea and Nausea And Vomiting 11/08/2014   Oxycodone Itching and Hives 11/08/2014   Buprenorphine hcl  04/19/2012   Clarithromycin Diarrhea 04/19/2012   Duloxetine  hcl Other (See Comments) 04/19/2012   Hydrocodone Itching 04/19/2012   Lactose  05/26/2018   Liraglutide Other (See Comments) 04/19/2012   Tramadol hcl Itching 04/19/2012    Family History  Problem Relation Age of Onset   Lung cancer Mother    Coronary artery  disease Mother    Diabetes Mother    Glaucoma Mother    Hypertension Mother    Colon polyps Mother    Migraines Father    Heart disease Father    Diabetes Maternal Grandmother    Diabetes Daughter    Migraines Son    Hypertension Brother     Social History   Socioeconomic History   Marital status: Significant Other    Spouse name: Not on file   Number of children: 3   Years of education: Not on file   Highest education level: Not on file  Occupational History   Occupation: retired  Tobacco Use   Smoking status: Former    Current packs/day: 0.00    Types: Cigarettes    Quit date: 2021    Years since quitting: 5.0   Smokeless tobacco: Never  Vaping Use   Vaping status: Never Used  Substance and Sexual Activity   Alcohol use: Never   Drug use: Never   Sexual activity: Not on file  Other Topics Concern   Not on file  Social History Narrative   Not on file   Social Drivers of Health   Tobacco Use: Medium Risk (12/14/2024)   Patient History    Smoking Tobacco Use: Former    Smokeless Tobacco Use: Never    Passive Exposure: Not on file  Financial Resource Strain: Low Risk (12/07/2024)   Received from [facility]   Overall Financial Resource Strain (CARDIA)    How hard is it for you to pay for the very basics like food, housing, medical care, and heating?: Not hard at all  Food Insecurity: No Food Insecurity (12/15/2024)   Epic    Worried About Programme Researcher, Broadcasting/film/video in the Last Year: Never true    Ran Out of Food in the Last Year: Never true  Transportation Needs: No Transportation Needs (12/07/2024)   Received from [facility]   Epic    In the past 12 months, has lack of transportation kept you from medical appointments or from getting medications?: No    In the past 12 months, has lack of transportation kept you from meetings, work, or from getting things needed for daily living?: No  Physical Activity: Not on file  Stress: Not on file  Social Connections: Not on  file  Intimate Partner Violence: Not At Risk (12/15/2024)   Epic    Fear of  Current or Ex-Partner: No    Emotionally Abused: No    Physically Abused: No    Sexually Abused: No  Depression (PHQ2-9): Low Risk (05/26/2024)   Depression (PHQ2-9)    PHQ-2 Score: 0  Alcohol Screen: Not on file  Housing: Low Risk (12/07/2024)   Received from [facility]    In the last 12 months, was there a time when you were not able to pay the mortgage or rent on time?: No    In the past 12 months, how many times have you moved where you were living?: 0    At any time in the past 12 months, were you homeless or living in a shelter (including now)?: No  Utilities: Not At Risk (12/07/2024)   Received from [facility]    In the past 12 months has the electric, gas, oil, or water company threatened to shut off services in your home?: No  Health Literacy: Not on file    Review of Systems: Pertinent positive and negative review of systems were noted in the above HPI section.  All other review of systems was otherwise negative.   Physical Exam: Vital signs in last 24 hours: Temp:  [97.6 F (36.4 C)-98.4 F (36.9 C)] 97.6 F (36.4 C) (01/16 1100) Pulse Rate:  [90-97] 94 (01/16 1100) Resp:  [14-20] 20 (01/16 1100) BP: (94-152)/(60-92) 98/60 (01/16 1100) SpO2:  [95 %-99 %] 96 % (01/16 1100) Last BM Date : 12/13/24 General:   Alert,  Well-developed, well-nourished, chronically ill-appearing older African-American female, pleasant and cooperative in NAD, family at bedside Head:  Normocephalic and atraumatic. Eyes:  Sclera clear, no icterus.   Conjunctiva pink. Ears:  Normal auditory acuity. Nose:  No deformity, discharge,  or lesions. Mouth:  No deformity or lesions.   Neck:  Supple; no masses or thyromegaly.  No JVD Lungs:  Clear throughout to auscultation.   No wheezes, crackles, or rhonchi.  She has definite costal margin tenderness along the right lower costal margin and milder tenderness  along the left costal margin  Heart:  Regular rate and rhythm; no murmurs, clicks, rubs,  or gallops. Abdomen:  Soft, obese , no focal tenderness, no palpable mass or hepatosplenomegaly, bowel sounds are present Rectal: Not done Msk:  Symmetrical without gross deformities. . Pulses:  Normal pulses noted. Extremities:  Without clubbing or edema. Neurologic:  Alert and  oriented x4;  grossly normal neurologically. Skin:  Intact without significant lesions or rashes.. Psych:  Alert and cooperative. Normal mood and affect.  Intake/Output from previous day: 01/15 0701 - 01/16 0700 In: 648 [P.O.:594; I.V.:54] Out: -  Intake/Output this shift: No intake/output data recorded.  Lab Results: Recent Labs    12/14/24 0826 12/15/24 0433 12/15/24 1125 12/16/24 0334  WBC 11.3* 10.0  --  9.9  HGB 12.2 12.7 12.9  13.3 12.8  HCT 37.0 38.4 38.0  39.0 38.2  PLT 266 256  --  266   BMET Recent Labs    12/14/24 0826 12/15/24 0433 12/15/24 1125 12/16/24 0334  NA 131* 137 141  139 135  K 4.8 4.4 3.9  4.0 3.8  CL 96* 100  --  99  CO2 24 26  --  24  GLUCOSE 255* 147*  --  150*  BUN 25* 26*  --  21  CREATININE 1.93* 1.92*  --  1.54*  CALCIUM  9.5 9.2  --  9.5   LFT Recent Labs    12/14/24 0826  PROT 7.4  ALBUMIN 4.1  AST 17  ALT 11  ALKPHOS 64  BILITOT 0.5   PT/INR No results for input(s): LABPROT, INR in the last 72 hours. Hepatitis Panel No results for input(s): HEPBSAG, HCVAB, HEPAIGM, HEPBIGM in the last 72 hours.    IMPRESSION:  #51 74 year old African-American female with severe chronic congestive heart failure with EF less than 20%, admitted 3 days ago with chest pain unrelieved by nitroglycerin . Patient is status post previous pacemaker, ICD placement and previous barrow stimulator device.  On IV milrinone  since admission  Patient says that the abdominal pain she usually feels is across her upper abdomen below her ribs and around into her back which is  described as a tight bandlike feeling that is the same pain as her chest pain.  She has a separate postprandial abdominal discomfort which has been present over the past couple of years not after every meal but occurs after eating and just described as a bloated type of feeling relieved by dicyclomine . Certainly in setting of chronic low flow state she may have some mesenteric insufficiency postprandially  She has a right upper quadrant discomfort which is musculoskeletal by exam with marked tenderness along the right costal margin.  #2 COPD  #3 status post prior left hemicolectomy for diverticulitis, last colonoscopy 2020 with  removal of 3 diminutive polyps  #4 new left renal lesion, possible malignancy needs MRI  #5 chronic kidney disease  Plan; low suspicion for other separate GI pathology unrelated to her chronic severe heart failure, and possible postprandial low flow state contributing to abdominal bloating.  EGD could be done but unlikely to change our management and she is at high risk for complications with sedation in setting of severe heart failure with angina, on milrinone   Her right upper quadrant pain by my exam is musculoskeletal likely related to costochondritis/arthritis type symptoms  Will discuss further with Dr. Charlanne but not certain any endoscopic intervention is indicated.  Would leave her on her chronic PPI, and use dicyclomine  3 times daily  10 mg AC  GI will be available if needed   Amy Esterwood PA-C 12/16/2024, 3:16 PM     Attending physician's note   I have taken history, reviewed the chart and examined the patient. I performed a substantive portion of this encounter, including complete performance of at least one of the key components, in conjunction with the APP. I agree with the Advanced Practitioner's note, impression and recommendations.   Adm with chest pain with severe CHF (EF<20%) Abdo pain/RUQ pain appears to be more musculoskeletal. Neg US , CT  this adm Prev EGD- 2020- HH, asymptomatic 2 cm distal esophageal stricture.  Discussed extensively with the patient and patient's family.  Doubt if EGD will be helpful currently.  Besides, patient would like to hold off currently Will continue Protonix  40 BID Continue dicyclomine  as before as it helps pt. Will be available if any change in clinical status or if needed.   Anselm Charlanne, MD [facility] [phone]  "

## 2024-12-17 DIAGNOSIS — N189 Chronic kidney disease, unspecified: Secondary | ICD-10-CM

## 2024-12-17 DIAGNOSIS — I5043 Acute on chronic combined systolic (congestive) and diastolic (congestive) heart failure: Secondary | ICD-10-CM | POA: Diagnosis not present

## 2024-12-17 DIAGNOSIS — R1030 Lower abdominal pain, unspecified: Secondary | ICD-10-CM

## 2024-12-17 DIAGNOSIS — J449 Chronic obstructive pulmonary disease, unspecified: Secondary | ICD-10-CM

## 2024-12-17 DIAGNOSIS — M797 Fibromyalgia: Secondary | ICD-10-CM | POA: Diagnosis not present

## 2024-12-17 DIAGNOSIS — F32A Depression, unspecified: Secondary | ICD-10-CM | POA: Diagnosis not present

## 2024-12-17 DIAGNOSIS — I2510 Atherosclerotic heart disease of native coronary artery without angina pectoris: Secondary | ICD-10-CM | POA: Diagnosis not present

## 2024-12-17 DIAGNOSIS — G8929 Other chronic pain: Secondary | ICD-10-CM | POA: Diagnosis not present

## 2024-12-17 DIAGNOSIS — I5023 Acute on chronic systolic (congestive) heart failure: Secondary | ICD-10-CM | POA: Diagnosis not present

## 2024-12-17 DIAGNOSIS — I255 Ischemic cardiomyopathy: Secondary | ICD-10-CM

## 2024-12-17 DIAGNOSIS — E1122 Type 2 diabetes mellitus with diabetic chronic kidney disease: Secondary | ICD-10-CM

## 2024-12-17 LAB — COMPREHENSIVE METABOLIC PANEL WITH GFR
ALT: 7 U/L (ref 0–44)
AST: 16 U/L (ref 15–41)
Albumin: 3.7 g/dL (ref 3.5–5.0)
Alkaline Phosphatase: 62 U/L (ref 38–126)
Anion gap: 9 (ref 5–15)
BUN: 16 mg/dL (ref 8–23)
CO2: 26 mmol/L (ref 22–32)
Calcium: 8.9 mg/dL (ref 8.9–10.3)
Chloride: 100 mmol/L (ref 98–111)
Creatinine, Ser: 1.49 mg/dL — ABNORMAL HIGH (ref 0.44–1.00)
GFR, Estimated: 36 mL/min — ABNORMAL LOW
Glucose, Bld: 108 mg/dL — ABNORMAL HIGH (ref 70–99)
Potassium: 4 mmol/L (ref 3.5–5.1)
Sodium: 135 mmol/L (ref 135–145)
Total Bilirubin: 0.4 mg/dL (ref 0.0–1.2)
Total Protein: 6.8 g/dL (ref 6.5–8.1)

## 2024-12-17 LAB — LUPUS ANTICOAGULANT PANEL
DRVVT: 56 s — ABNORMAL HIGH (ref 0.0–47.0)
PTT Lupus Anticoagulant: 35.4 s (ref 0.0–43.5)

## 2024-12-17 LAB — GLUCOSE, CAPILLARY
Glucose-Capillary: 109 mg/dL — ABNORMAL HIGH (ref 70–99)
Glucose-Capillary: 155 mg/dL — ABNORMAL HIGH (ref 70–99)
Glucose-Capillary: 201 mg/dL — ABNORMAL HIGH (ref 70–99)
Glucose-Capillary: 141 mg/dL — ABNORMAL HIGH (ref 70–99)

## 2024-12-17 LAB — COOXEMETRY PANEL
Carboxyhemoglobin: 2.1 % — ABNORMAL HIGH (ref 0.5–1.5)
Methemoglobin: <0.7 % (ref 0.0–1.5)
O2 Saturation: 68.6 %
Total hemoglobin: 11.8 g/dL — ABNORMAL LOW (ref 12.0–16.0)

## 2024-12-17 LAB — DRVVT CONFIRM: dRVVT Confirm: 1.3 ratio — ABNORMAL HIGH (ref 0.8–1.2)

## 2024-12-17 LAB — DRVVT MIX: dRVVT Mix: 48.3 s — ABNORMAL HIGH (ref 0.0–40.4)

## 2024-12-17 LAB — HEPATITIS B CORE ANTIBODY, TOTAL: HEP B CORE AB: NEGATIVE

## 2024-12-17 MED ORDER — POLYETHYLENE GLYCOL 3350 17 G PO PACK
17.0000 g | PACK | Freq: Two times a day (BID) | ORAL | Status: DC
  Administered 2024-12-17 – 2024-12-21 (×5): 17 g via ORAL
  Filled 2024-12-17 (×8): qty 1

## 2024-12-17 MED ORDER — ADULT MULTIVITAMIN W/MINERALS CH
1.0000 | ORAL_TABLET | Freq: Every day | ORAL | Status: DC
  Administered 2024-12-17 – 2024-12-22 (×6): 1 via ORAL
  Filled 2024-12-17 (×6): qty 1

## 2024-12-17 MED ORDER — SENNA 8.6 MG PO TABS
1.0000 | ORAL_TABLET | Freq: Two times a day (BID) | ORAL | Status: DC
  Administered 2024-12-17 – 2024-12-21 (×5): 8.6 mg via ORAL
  Filled 2024-12-17 (×8): qty 1

## 2024-12-17 MED ORDER — BOOST PLUS PO LIQD
237.0000 mL | Freq: Three times a day (TID) | ORAL | Status: DC
  Administered 2024-12-17 – 2024-12-20 (×8): 237 mL via ORAL
  Filled 2024-12-17 (×19): qty 237

## 2024-12-17 NOTE — Progress Notes (Signed)
 Initial Nutrition Assessment  DOCUMENTATION CODES:  Not applicable  INTERVENTION:  Multivitamin w/ minerals daily Liberalize diet to 2 gm Sodium diet due to weight loss and not interested in food. Encourage good PO intake  Boost Plus po BID, each supplement provides 360 kcal and 14 grams of protein Patient lactose intolerant, supplement suitable for lactose intolerance.   NUTRITION DIAGNOSIS: Increased nutrient needs related to chronic illness as evidenced by estimated needs.  GOAL:  Patient will meet greater than or equal to 90% of their needs  MONITOR:  PO intake, Supplement acceptance, Labs, I & O's  REASON FOR ASSESSMENT:  Consult LVAD Eval  ASSESSMENT:  74 y.o. female present to the ED with chest pain. PMH includes GERD, CHF, T2DM, HLD, CAD, COPD, HTN, Glaucoma, diverticulitis, s/p pacemaker, Boroughs stimulator, fibromyalgia, and CKD III. Pt admitted with chest pain, acute on chronic HF, and AKI on CKD.   RD working remotely at time of assessment. Patient currently undergoing LVAD evaluation by team.   Patient reports that she has a good appetite currently, but the food is not great. Family has not been bringing in any food for her.  Reports that appetite has decreased since starting Ozempic at the end of February 2025. Reports that she would want to eat and cook foods, but then could not finish the meal. Typically has breakfast and dinner. Snacks on fruit or other foods between meals. No nausea or vomiting. Does not drink any oral nutrition supplements at home.   Discussed liberalizing diet to 2 gm Na diet to provide more options on menu, patient agreeable. Also discussed starting oral nutrition supplements to ensure adequate calories and protein to preserve lean muscle mass, patient agreeable and prefers chocolate flavor.   Patient chart flags for lactose allergy, RD discussed with patient. Patient clarified that she is lactose intolerance and does not have an allergy to  lactose.   Meal Intake 1/16: 65% dinner  Admission/Current Weight: 76.3 kg Reports that she has lost about 35 pounds since starting on Ozempic. Per chart review, patient with a 16.7% weight loss within the past 11 months.   Nutrition Related Medications: Vitamin B12, Bentyl , Jardiance , Folic Acid , Novolog  0-15 units TID, Lantus  25 units BID, Protonix , Miralax , Senna, Spironolactone , Vitamin D   Labs: Sodium 135, Potassium 4.0, BUN 16, Creatinine 1.49,   CBG: 109-182 mg/dL x 24 hrs   UOP: 2 occurrence x 24 hrs  NUTRITION - FOCUSED PHYSICAL EXAM: Deferred   Diet Order:   Diet Order             Diet 2 gram sodium Room service appropriate? Yes with Assist; Fluid consistency: Thin  Diet effective now                  EDUCATION NEEDS:  Education needs have been addressed  Skin:  Skin Assessment: Reviewed RN Assessment  Last BM:  1/13  Height:  Ht Readings from Last 1 Encounters:  12/14/24 5' 5 (1.651 m)   Weight:  Wt Readings from Last 1 Encounters:  12/14/24 76.3 kg   Ideal Body Weight:  56.8 kg  BMI:  Body mass index is 27.99 kg/m.  Estimated Nutritional Needs:  Kcal:  1800-2000 Protein:  80-100 grams Fluid:  >/= 1.8 L   Nestora Glatter RD, LDN Registered Dietitian I Please see AMION for contact information

## 2024-12-17 NOTE — Progress Notes (Signed)
 "  HD#2 SUBJECTIVE:  Patient Summary: Karen Warner is a 74 y.o. with a pertinent PMH of CAD, pacemaker placement, Boroughs stimulator, ischemic cardiomyopathy, COPD, fibromyalgia, CKD who presented with CP and admitted for cardiac cath. Cardiac cath revealed low cardiac output with low filing pressures and stable coronary anatomy. Advanced Heart Failure onboard and LVAD coordinators consulted.   Overnight Events and Interim History: NEO. She was resting peacefully at start of exam. Reports no concerns. States that since yesterday her pain control is sufficient. Admits to some abdominal discomfort in the lower abdomen and wonders if it is her constipation. Denies emesis or nausea.  OBJECTIVE:  Vital Signs: Vitals:   12/16/24 1525 12/16/24 1909 12/17/24 0011 12/17/24 0500  BP: 106/72 98/67 111/73 123/73  Pulse: 94 88 88 87  Resp: 17 19 19 18   Temp: 97.6 F (36.4 C) 97.7 F (36.5 C) 98.1 F (36.7 C) 97.9 F (36.6 C)  TempSrc: Oral Oral Oral Oral  SpO2: 97% 97% 99% 96%  Weight:      Height:       Supplemental O2: Room Air SpO2: 96 %  Filed Weights   12/14/24 2245  Weight: 76.3 kg    Intake/Output Summary (Last 24 hours) at 12/17/2024 0732 Last data filed at 12/16/2024 2000 Gross per 24 hour  Intake 240 ml  Output --  Net 240 ml   Net IO Since Admission: 888 mL [12/17/24 0732]  Physical Exam: Physical Exam Constitutional:      General: She is not in acute distress.    Appearance: She is well-developed. She is not ill-appearing.  Cardiovascular:     Rate and Rhythm: Normal rate and regular rhythm.     Heart sounds: Normal heart sounds.  Pulmonary:     Effort: Pulmonary effort is normal. No respiratory distress.     Breath sounds: Normal breath sounds.  Abdominal:     General: Bowel sounds are normal.     Palpations: Abdomen is soft.     Tenderness: There is abdominal tenderness.     Comments: Mild TTP centered in periumbilical region.  Musculoskeletal:      Right lower leg: No edema.     Left lower leg: No edema.  Skin:    General: Skin is warm and dry.  Neurological:     Mental Status: She is alert.    Patient Lines/Drains/Airways Status     Active Line/Drains/Airways     Name Placement date Placement time Site Days   Peripheral IV 12/14/24 20 G 1 Left Antecubital 12/14/24  0800  Antecubital  3   Peripheral IV 12/15/24 20 G Right Antecubital 12/15/24  0723  Antecubital  2   PICC Double Lumen 12/15/24 Right Basilic 37 cm 0 cm 12/15/24  8468  -- 2           ASSESSMENT/PLAN:  Assessment: Principal Problem:   Chest pain Active Problems:   Abdominal pain   Gastroesophageal reflux disease   Diabetes mellitus (HCC)   Acute on chronic combined systolic and diastolic CHF (congestive heart failure) (HCC)   Decompensated heart failure with reduced ejection fraction (HFrEF) (HCC)  Karen Warner is a 74 y.o. with a pertinent PMH of CAD, pacemaker placement, Boroughs stimulator, ischemic cardiomyopathy, COPD, fibromyalgia, CKD who presented with CP and admitted for cardiac cath. Cardiac cath revealed low cardiac output with low filing pressures and stable coronary anatomy. Advanced Heart Failure onboard and LVAD coordinators consulted.   Plan: #Acute on Chronic HF -->  Low output HF CAD Ischemic cardiomyopathy with [employer] CRT-D device and Barostim device  Chest Pain  L and R Heart Cath 12/15/2024: low cardiac output with low filling pressures. Stable coronary anatomy. Low output HF. Advanced HF team consulting. LVAD coordinators consulted and will speak again with patient on 1/19. PICC line in place for CVP and co-ox, these are stable. Plan to observe on milrinone  over the weekend awaiting cardiology intervention planning on Monday. Pain (including chest) consistently reproducible on exam arguing against acute ischemia. Patient not a heart transplant candidate. - await LVAD consult 1/19 - appreciate cardiology  recommendations:             - milrinone  infusion 0.25 mcg/kg/min            - continue Jardiance  10 mg daily             - continue ASA 81 mg            - continue Ezetimibe  10 mg and Crestor  40 mg daily, goal <55             - continue continue Imdur  15 mg daily             - holding Plavix  with LVAD consideration             - holding diuretics given low filling pressures             - holding coreg  given low output state             - holding losartan  and spironolactone  given AKI             - holding digoxin  given elevated level; may restart at 0.0625 mg every other day once renal function stabilizes. (See below)                       - repeat digoxin  level ordered            - strict I/O and daily weights Recommendations for CP            - continue Ranolazine  500 mg BID            - continue nitroglycerin  PRN   #Abdominal Pain  History IBS-C. RUQ US  and CT abdomen/pelvic notes GB sludge but no acute cholecystitis. LFTs unremarkable. Symptoms may be related to low output HF however has not been improving with cardiac interventions. Dicyclomine  increased, caution for ADRs with increase mostly anti-cholinergic, however can also be associated with dizziness, palpitations, or arrhythmia. Appreciate GI evaluation, do not suspect acute process or intervention. Additionally, will adjust medicines to promote a bowel movement, none since arriving here. - continue protonix  40 mg daily  - continue bentyl  15 mg TID - continue maalox/mylanta q 4 hours PRN - Miralax  and senna BID - closely monitor for anticholinergic symptoms associated with bentyl  increase.    #AKI on CKD, POA Present at admission but now resolved to baseline Cr of around 1.5. Likely cardiorenal in setting of low output HF - monitor daily BMP - avoid nephrotoxic medications (see above) - renally dose medications    Chronic pain/fibromyalgia  Chest, neck, back pain reproducible on exam. Mostly R>L. No signs of infection  -  continue Tylenol  PRN - continue Flexeril  5 mg TID PRN - continue lidocaine  patch - continue voltaren  gel    Type 2 DM Last A1C: 6.3 (12/9). Goal glucose 140-180, at goal. - continue lantus  25 U BID, consider  increase with restarted diet  - continue SSI - continue jardiance  10 mg daily    Depression - continue trintellix  5 mg nightly    COPD -albuterol  PRN    Other home medications ordered - vitamin B12, folic acid , vit D ordered - valcyclovir 500 mg daily ordered    Best Practice: Diet: cardiac diet VTE: heparin  injection 5,000 Units Start: 12/15/24 2200 SCD's Start: 12/15/24 2122 Code: Full   Disposition planning: Therapy Recs: None, DME: none DISPO: Anticipated discharge pending LVAD/cardiomyopathy workup.   Signature: Lonni Africa, D.O.  Internal Medicine Resident, PGY-2 [facility]  Pager: # [phone]. 7:32 AM, 12/17/2024  "

## 2024-12-17 NOTE — Progress Notes (Signed)
 "    Advanced Heart Failure Rounding Note  Cardiologist: Dub Huntsman, DO  [facility] Cardiologist: Dr. Rolan  Chief Complaint: A/c systolic HF w/ low output  Patient Profile   Karen Warner is a 74 y.o. female with with history of CAD and ischemic cardiomyopathy, EF <20%, CKD III and COPD admitted w/ a/c CHF, chest/abdominal pain. RHC c/w low output.   Significant events:   1/15: RHC (RA 2, PA 21/10, PCW 3,  PA sat 59%, TD CI 1.49, FICK CI 1.88, PAPi 5.5), started on milrinone            LHC patent stents, nonobstructive CAD    Subjective:    On Milrinone  0.25. Coox 69% CVP 8-10  Feels good. No CP or SOB   Objective:   Weight Range: 76.3 kg Body mass index is 27.99 kg/m.   Vital Signs:   Temp:  [97.6 F (36.4 C)-98.1 F (36.7 C)] 98.1 F (36.7 C) (01/17 0812) Pulse Rate:  [85-94] 85 (01/17 0812) Resp:  [17-19] 17 (01/17 0812) BP: (98-123)/(67-75) 113/75 (01/17 0812) SpO2:  [96 %-100 %] 100 % (01/17 0812) Last BM Date : 12/13/24  Weight change: Filed Weights   12/14/24 2245  Weight: 76.3 kg    Intake/Output:   Intake/Output Summary (Last 24 hours) at 12/17/2024 1416 Last data filed at 12/16/2024 2000 Gross per 24 hour  Intake 240 ml  Output --  Net 240 ml     Physical Exam   General:  Sitting up in bed. No resp difficulty HEENT: normal Neck: supple. JVP 8 Cor: Regular rate & rhythm. No rubs, gallops or murmurs. Lungs: clear Abdomen: soft, nontender, nondistended.Good bowel sounds. Extremities: no cyanosis, clubbing, rash, edema Neuro: alert & orientedx3, cranial nerves grossly intact. moves all 4 extremities w/o difficulty. Affect pleasant  Telemetry   SR 80s, barostim artifact, personally reviewed    Labs   CBC Recent Labs    12/15/24 0433 12/15/24 1125 12/16/24 0334  WBC 10.0  --  9.9  HGB 12.7 12.9  13.3 12.8  HCT 38.4 38.0  39.0 38.2  MCV 95.3  --  94.8  PLT 256  --  266   Basic Metabolic Panel Recent Labs    98/83/73 0334  12/17/24 0514  NA 135 135  K 3.8 4.0  CL 99 100  CO2 24 26  GLUCOSE 150* 108*  BUN 21 16  CREATININE 1.54* 1.49*  CALCIUM  9.5 8.9   Liver Function Tests Recent Labs    12/17/24 0514  AST 16  ALT 7  ALKPHOS 62  BILITOT 0.4  PROT 6.8  ALBUMIN 3.7   No results for input(s): LIPASE, AMYLASE in the last 72 hours.  Cardiac Enzymes No results for input(s): CKTOTAL, CKMB, CKMBINDEX, TROPONINI in the last 72 hours.  BNP: BNP (last 3 results) Recent Labs    09/01/24 1457  BNP 169.4*    ProBNP (last 3 results) Recent Labs    12/13/24 1529 12/14/24 0826  PROBNP 1,342.0* 1,539.0*     D-Dimer No results for input(s): DDIMER in the last 72 hours. Hemoglobin A1C No results for input(s): HGBA1C in the last 72 hours. Fasting Lipid Panel Recent Labs    12/16/24 1449  CHOL 122  HDL 48  LDLCALC 43  TRIG 153*  CHOLHDL 2.5   Medications:   Scheduled Medications:  acetaminophen   650 mg Oral QID   alum & mag hydroxide-simeth  15 mL Oral BID   aspirin  EC  81 mg Oral  Daily   Chlorhexidine  Gluconate Cloth  6 each Topical Daily   cyanocobalamin   1,000 mcg Oral Daily   diclofenac  Sodium  2 g Topical QID   dicyclomine   15 mg Oral TID AC & HS   empagliflozin   10 mg Oral Daily   ezetimibe   10 mg Oral Daily   folic acid   1 mg Oral Daily   heparin   5,000 Units Subcutaneous Q8H   insulin  aspart  0-15 Units Subcutaneous TID WC   insulin  glargine  25 Units Subcutaneous BID   isosorbide  mononitrate  15 mg Oral Daily   lactose free nutrition  237 mL Oral TID WC   lidocaine   1 patch Transdermal Q24H   loratadine   10 mg Oral Daily   multivitamin with minerals  1 tablet Oral Daily   pantoprazole   40 mg Oral Daily   polyethylene glycol  17 g Oral BID   ranolazine   500 mg Oral BID   rosuvastatin   40 mg Oral QHS   senna  1 tablet Oral BID   sodium chloride  flush  10-40 mL Intracatheter Q12H   sodium chloride  flush  3 mL Intravenous Q12H   spironolactone   12.5 mg  Oral Daily   valACYclovir   500 mg Oral Daily   [START ON 12/18/2024] Vitamin D  (Ergocalciferol )  50,000 Units Oral Q Sun   vortioxetine  HBr  5 mg Oral QHS    Infusions:  milrinone  0.25 mcg/kg/min (12/16/24 2333)    PRN Medications: albuterol , cyclobenzaprine , HYDROmorphone , nitroGLYCERIN , ondansetron  (ZOFRAN ) IV, sodium chloride  flush, sodium chloride  flush  Assessment/Plan   1. Acute on chronic systolic CHF: Ischemic cardiomyopathy.  [employer] CRT-D device.  Echo in 8/24 showed EF < 20%, moderate LV dilation, mild LVH, RV normal.  This is somewhat worse than priors. LHC/RHC in 11/24 showed low CI at 2.12, normal filling pressures, unchanged coronary anatomy with no severe stenoses. S/p barostimulation activation therapy device 2/25. Echo from [facility] showed EF 20-25%, paradoxical LV motion. Echo 12/25 showed EF<20%, normal RV.  We have been concerned about possible low output HF, I wonder if this could be contributing to her GI symptoms.  RHC/LHC this admit showed stable coronary anatomy and low filling pressures but low output by Fick and thermodilution.  - On milrinone  0.25 mcg/kg/min Co-ox 69% today on milrinone , CVP 8-10 - Continue Milrinone  0.25 mcg/kg/min - Start lasix  - Continue Jardiance .  - Spironolactone  and losartan  held w/ elevated creatinine, can likely resume as SCr improves - Hold Coreg  with low output.  - Digoxin  held with elevated level (1.2), can eventually restart at 0.0625 mg every other day.  - She needs evaluation for advanced therapies (LVAD). LVAD coordinators aware. Family meeting planned for Monday for GOC discussion  2.  CAD: PCI to mid/distal LAD in 2020 and proximal LAD in 2021.  LHC in 11/24 showed stable coronary anatomy with no severe stenoses. She reported to the ER with 2 days of atypical chest pain.  TnT minimally elevated with no trend, this is probably due to demand ischemia.  Cath this admit showed stable coronary anatomy.  - Continue  ranolazine  500 mg bid.  - Continue ASA, hold Plavix  for now for LVAD consideration.  - Continue Crestor  + Zetia , goal LDL < 55. - No s/s angina 3. COPD: History of smoking, quit 2021.   4. CKD 3: Follows with nephology at [facility]. Creatinine improved today, 1.9>>1.5 > 1.49 - continue milrinone  per above  - Hold losartan  and spironolactone  w/ soft BP .  -  Can continue Jardiance  5. Obesity: - Has been on semaglutide. 6. PAD: Moderately decreased ABI on right in 7/25.  No claudication.  - She saw Dr. Serene, plan medical management.  7. GI: RUQ and epigastric pain.  No evidence by US  for acute cholecystitis.  GI symptoms could be a manifestation of CHF but symptoms not improving despite milrinone  support - seen by GI. No need for further w/u at this time  Length of Stay: 2  Toribio Fuel, MD  12/17/2024, 2:16 PM  Advanced Heart Failure Team Pager [phone] (M-F; 7a - 5p)   Please visit [email or web address]: For overnight coverage please call cardiology fellow first. If fellow not available call Shock/ECMO MD on call.  For ECMO / Mechanical Support (Impella, IABP, LVAD) issues call Shock / ECMO MD on call.   Patient seen with PA, I formulated the plan and agree with the above note.   Co-ox 61% on milrinone  0.25, CVP < 5.  Creatinine 1.9 => 1.54.  SBP 90s-100s.   She is still having abdominal discomfort.   General: NAD Neck: No JVD, no thyromegaly or thyroid nodule.  Lungs: Clear to auscultation bilaterally with normal respiratory effort. CV: Nondisplaced PMI.  Heart regular S1/S2, no S3/S4, no murmur.  No peripheral edema.    Abdomen: Soft, nontender, no hepatosplenomegaly, no distention.  Skin: Intact without lesions or rashes.  Neurologic: Alert and oriented x 3.  Psych: Normal affect. Extremities: No clubbing or cyanosis.  HEENT: Normal.   Low output HF by RHC yesterday with low filling pressures.  GDMT has been limited by low blood pressure.  She was admitted with dyspnea/fatigue  but also abdominal pain.  Workup so far has not found an etiology for her abdominal discomfort making me concerned that it could be related to her low output HF.  So far, now improved with milrinone  gtt started yesterday.  Co-ox ok at 61%, CVP remains low. Creatinine is improving.  - Continue milrinone  0.25 - We had a discussion about LVAD placement.  With persistently low EF and low output by Fick and thermodilution, I think it is time to consider this.  She has family coming in (son and daughter) and wants to get everyone together to discuss on Monday.  LVAD coordinator aware.  - I would like her to be seen by her GI MD to make sure that we are not missing anything prior to considering LVAD, will consult today.  - No diuretic needed at this time.  - Mobilize.   Toribio Eryn Marandola 12/17/2024 2:16 PM  "

## 2024-12-17 NOTE — Evaluation (Signed)
 Physical Therapy Evaluation Patient Details Name: Karen Warner MRN: [id number] DOB: 1951/11/15 Today's Date: 12/17/2024  History of Present Illness  74 yo F presented to hospital with anterior chest pain. She states this pain is similar to her previous CAD episodes. Underwent heart cath 12/15/2024 and now advanced heart failure and LVAD coordinators consulted. PHMx:CAD, COPD (quit smoking 2021). Ischemic CM with EF < 20%, ICD placed. CHF, chronic low back pain, CKD3, DM2, DDD-cervical, fibromyalgia, MI, peripheral neuropathy, PVD, retinopathy and glaucoma  Clinical Impression  Prior to admittance, pt was mobilizing independently and was independent with ADLs. Pt presents to evaluation with deficits in mobility, activity tolerance, pain, and power. Pt was able to ambulate household level distances without AD and no physical assistance. Discussed possibility of using rollator for activity pacing with pt declining reporting she does not feel she needs to use an AD at the moment. PT will continue to treat pt while she is admitted. Waiting to make follow up PT recommendations until pt's decision on possible LVAD.         If plan is discharge home, recommend the following: A little help with bathing/dressing/bathroom;Assistance with cooking/housework;Assist for transportation;Help with stairs or ramp for entrance   Can travel by private vehicle        Equipment Recommendations None recommended by PT  Recommendations for Other Services       Functional Status Assessment Patient has had a recent decline in their functional status and demonstrates the ability to make significant improvements in function in a reasonable and predictable amount of time.     Precautions / Restrictions Precautions Precautions: Fall Recall of Precautions/Restrictions: Intact Restrictions Weight Bearing Restrictions Per Provider Order: No      Mobility  Bed Mobility Overal bed mobility: Needs  Assistance Bed Mobility: Supine to Sit, Sit to Supine     Supine to sit: Supervision, HOB elevated Sit to supine: Supervision, HOB elevated   General bed mobility comments: supine <> sit on L side of bed. increased time to complete.    Transfers Overall transfer level: Needs assistance Equipment used: None Transfers: Sit to/from Stand Sit to Stand: Supervision           General transfer comment: STS from EOB without AD and no physical assitance. Pt pushes up from bed with BUE and then takes increased time to stabilize prior to beginning gait.    Ambulation/Gait Ambulation/Gait assistance: Supervision Gait Distance (Feet): 150 Feet Assistive device: None Gait Pattern/deviations: Step-through pattern, Decreased stride length Gait velocity: reduced Gait velocity interpretation: <1.8 ft/sec, indicate of risk for recurrent falls   General Gait Details: Pt demonstrates reciprocal gait pattern with equal WB through BLE, and reduced gait speed.  No signs of instability or LOB noted.  Stairs            Wheelchair Mobility     Tilt Bed    Modified Rankin (Stroke Patients Only)       Balance Overall balance assessment: Needs assistance Sitting-balance support: No upper extremity supported, Feet supported Sitting balance-Leahy Scale: Good Sitting balance - Comments: seated EOB for MMT with no LOB   Standing balance support: No upper extremity supported, During functional activity Standing balance-Leahy Scale: Good Standing balance comment: able to ambulate, shifting weight, with no LOBs                             Pertinent Vitals/Pain Pain Assessment Pain Assessment: No/denies pain  Home Living Family/patient expects to be discharged to:: Private residence Living Arrangements: Children;Other relatives Available Help at Discharge: Family;Available PRN/intermittently Type of Home: House Home Access: Stairs to enter Entrance Stairs-Rails:  Left;Right;Can reach both Entrance Stairs-Number of Steps: 6   Home Layout: One level Home Equipment: Agricultural Consultant (2 wheels);Rollator (4 wheels);Shower seat      Prior Function Prior Level of Function : Independent/Modified Independent             Mobility Comments: indep without AD ADLs Comments: indep     Extremity/Trunk Assessment   Upper Extremity Assessment Upper Extremity Assessment: Defer to OT evaluation    Lower Extremity Assessment Lower Extremity Assessment: Overall WFL for tasks assessed    Cervical / Trunk Assessment Cervical / Trunk Assessment: Normal  Communication   Communication Communication: No apparent difficulties    Cognition Arousal: Alert Behavior During Therapy: WFL for tasks assessed/performed   PT - Cognitive impairments: No apparent impairments                         Following commands: Intact       Cueing Cueing Techniques: Verbal cues, Gestural cues     General Comments General comments (skin integrity, edema, etc.): VSS on RA    Exercises     Assessment/Plan    PT Assessment Patient needs continued PT services  PT Problem List Decreased activity tolerance;Decreased mobility;Cardiopulmonary status limiting activity       PT Treatment Interventions DME instruction;Gait training;Stair training;Functional mobility training;Therapeutic activities;Therapeutic exercise;Balance training;Patient/family education;Manual techniques;Modalities    PT Goals (Current goals can be found in the Care Plan section)  Acute Rehab PT Goals Patient Stated Goal: to feel better PT Goal Formulation: With patient Time For Goal Achievement: 12/31/24 Potential to Achieve Goals: Good    Frequency Min 2X/week     Co-evaluation               AM-PAC PT 6 Clicks Mobility  Outcome Measure Help needed turning from your back to your side while in a flat bed without using bedrails?: A Little Help needed moving from lying on  your back to sitting on the side of a flat bed without using bedrails?: A Little Help needed moving to and from a bed to a chair (including a wheelchair)?: A Little Help needed standing up from a chair using your arms (e.g., wheelchair or bedside chair)?: A Little Help needed to walk in hospital room?: A Little Help needed climbing 3-5 steps with a railing? : Total 6 Click Score: 16    End of Session Equipment Utilized During Treatment: Gait belt Activity Tolerance: Patient tolerated treatment well Patient left: in bed;with call bell/phone within reach;with bed alarm set Nurse Communication: Mobility status PT Visit Diagnosis: Other abnormalities of gait and mobility (R26.89)    Time: 1000-1028 PT Time Calculation (min) (ACUTE ONLY): 28 min   Charges:   PT Evaluation $PT Eval Moderate Complexity: 1 Mod   PT General Charges $$ ACUTE PT VISIT: 1 Visit         Leontine Hilt DPT Acute Rehab Services [phone] Prefer contact via chat   Leontine B Nada Godley 12/17/2024, 11:15 AM

## 2024-12-17 NOTE — Plan of Care (Signed)
   Problem: Education: Goal: Ability to describe self-care measures that may prevent or decrease complications (Diabetes Survival Skills Education) will improve Outcome: Progressing Goal: Individualized Educational Video(s) Outcome: Progressing   Problem: Coping: Goal: Ability to adjust to condition or change in health will improve Outcome: Progressing   Problem: Fluid Volume: Goal: Ability to maintain a balanced intake and output will improve Outcome: Progressing   Problem: Health Behavior/Discharge Planning: Goal: Ability to identify and utilize available resources and services will improve Outcome: Progressing Goal: Ability to manage health-related needs will improve Outcome: Progressing   Problem: Metabolic: Goal: Ability to maintain appropriate glucose levels will improve Outcome: Progressing   Problem: Nutritional: Goal: Maintenance of adequate nutrition will improve Outcome: Progressing Goal: Progress toward achieving an optimal weight will improve Outcome: Progressing   Problem: Skin Integrity: Goal: Risk for impaired skin integrity will decrease Outcome: Progressing   Problem: Tissue Perfusion: Goal: Adequacy of tissue perfusion will improve Outcome: Progressing   Problem: Education: Goal: Understanding of CV disease, CV risk reduction, and recovery process will improve Outcome: Progressing Goal: Individualized Educational Video(s) Outcome: Progressing   Problem: Activity: Goal: Ability to return to baseline activity level will improve Outcome: Progressing   Problem: Cardiovascular: Goal: Ability to achieve and maintain adequate cardiovascular perfusion will improve Outcome: Progressing Goal: Vascular access site(s) Level 0-1 will be maintained Outcome: Progressing   Problem: Health Behavior/Discharge Planning: Goal: Ability to safely manage health-related needs after discharge will improve Outcome: Progressing   Problem: Education: Goal: Knowledge  of General Education information will improve Description: Including pain rating scale, medication(s)/side effects and non-pharmacologic comfort measures Outcome: Progressing   Problem: Health Behavior/Discharge Planning: Goal: Ability to manage health-related needs will improve Outcome: Progressing   Problem: Clinical Measurements: Goal: Ability to maintain clinical measurements within normal limits will improve Outcome: Progressing Goal: Will remain free from infection Outcome: Progressing Goal: Diagnostic test results will improve Outcome: Progressing Goal: Respiratory complications will improve Outcome: Progressing Goal: Cardiovascular complication will be avoided Outcome: Progressing   Problem: Activity: Goal: Risk for activity intolerance will decrease Outcome: Progressing   Problem: Nutrition: Goal: Adequate nutrition will be maintained Outcome: Progressing   Problem: Coping: Goal: Level of anxiety will decrease Outcome: Progressing   Problem: Elimination: Goal: Will not experience complications related to bowel motility Outcome: Progressing Goal: Will not experience complications related to urinary retention Outcome: Progressing   Problem: Pain Managment: Goal: General experience of comfort will improve and/or be controlled Outcome: Progressing   Problem: Safety: Goal: Ability to remain free from injury will improve Outcome: Progressing   Problem: Skin Integrity: Goal: Risk for impaired skin integrity will decrease Outcome: Progressing

## 2024-12-18 ENCOUNTER — Inpatient Hospital Stay (HOSPITAL_COMMUNITY)

## 2024-12-18 DIAGNOSIS — Z87891 Personal history of nicotine dependence: Secondary | ICD-10-CM | POA: Diagnosis not present

## 2024-12-18 DIAGNOSIS — I5021 Acute systolic (congestive) heart failure: Secondary | ICD-10-CM | POA: Diagnosis not present

## 2024-12-18 DIAGNOSIS — Z95810 Presence of automatic (implantable) cardiac defibrillator: Secondary | ICD-10-CM | POA: Diagnosis not present

## 2024-12-18 DIAGNOSIS — I255 Ischemic cardiomyopathy: Secondary | ICD-10-CM | POA: Diagnosis not present

## 2024-12-18 DIAGNOSIS — I2510 Atherosclerotic heart disease of native coronary artery without angina pectoris: Secondary | ICD-10-CM | POA: Diagnosis not present

## 2024-12-18 DIAGNOSIS — I5023 Acute on chronic systolic (congestive) heart failure: Secondary | ICD-10-CM | POA: Diagnosis not present

## 2024-12-18 LAB — GLUCOSE, CAPILLARY
Glucose-Capillary: 126 mg/dL — ABNORMAL HIGH (ref 70–99)
Glucose-Capillary: 224 mg/dL — ABNORMAL HIGH (ref 70–99)
Glucose-Capillary: 89 mg/dL (ref 70–99)
Glucose-Capillary: 183 mg/dL — ABNORMAL HIGH (ref 70–99)

## 2024-12-18 LAB — BASIC METABOLIC PANEL WITH GFR
Anion gap: 9 (ref 5–15)
BUN: 15 mg/dL (ref 8–23)
CO2: 24 mmol/L (ref 22–32)
Calcium: 8.9 mg/dL (ref 8.9–10.3)
Chloride: 102 mmol/L (ref 98–111)
Creatinine, Ser: 1.41 mg/dL — ABNORMAL HIGH (ref 0.44–1.00)
GFR, Estimated: 39 mL/min — ABNORMAL LOW
Glucose, Bld: 185 mg/dL — ABNORMAL HIGH (ref 70–99)
Potassium: 4.5 mmol/L (ref 3.5–5.1)
Sodium: 135 mmol/L (ref 135–145)

## 2024-12-18 LAB — COOXEMETRY PANEL
Carboxyhemoglobin: 0.7 % (ref 0.5–1.5)
Methemoglobin: 0.7 % (ref 0.0–1.5)
O2 Saturation: 59.4 %
Total hemoglobin: 11.1 g/dL — ABNORMAL LOW (ref 12.0–16.0)

## 2024-12-18 LAB — LIPOPROTEIN A (LPA): Lipoprotein (a): 89.5 nmol/L — ABNORMAL HIGH

## 2024-12-18 MED ORDER — ONDANSETRON 4 MG PO TBDP: 4.0000 mg | ORAL_TABLET | Freq: Three times a day (TID) | ORAL | Status: DC | PRN

## 2024-12-18 MED ORDER — UBROGEPANT 50 MG PO TABS
1.0000 | ORAL_TABLET | Freq: Every day | ORAL | Status: AC | PRN
  Administered 2024-12-18 – 2024-12-20 (×2): 50 mg via ORAL
  Filled 2024-12-18 (×2): qty 1

## 2024-12-18 MED ORDER — BISACODYL 10 MG RE SUPP: 10.0000 mg | Freq: Every day | RECTAL | Status: DC | PRN

## 2024-12-18 NOTE — Consult Note (Signed)
 "   [address] [place] [address]             [phone]    Karen Warner [facility] Medical Record #[id number] Date of Birth: [birth year]  Referring: No ref. provider found Primary Care: Silver Lamar LABOR, MD Primary Cardiologist:Kardie Tobb, DO  Chief Complaint:    Chief Complaint  Patient presents with   Chest Pain    History of Present Illness:     Karen Warner is a 74 y.o. female with history of ischemic cardiomyopathy who was admitted to the hospital in acute decompensated heart failure and GI symptoms.  I have been asked to see her for consideration of LVAD implantation  The patient was being followed outpatient by the heart failure team.  At her last visit on 12/13/24, she was dyspneic while walking on flat group and was having chest pain for 3 days.  She then presented the next day to the ER with severe chest pain. She was admitted and subsequent RHC demonstrated low output.  [name] is fully functional and completely independent in her ADLs.  She lives with her significant other on [place] acres of land.  History of barostim and CRT-D implantation. Also had a colon resection for diverticulitis.    She is right hand dominant.    She is a former smoker - she started at 75 years old and quit at 74 years old, smoking 1/2 pack per day.  Approx 22.5 pack years.  LHC (12/15/24): Non-obstructive CAD RHC (12/15/24): RA mean 2, PA 21/10 (13), PCW 3, CI 1.9, PAPi 5.5 TTE (11/18/24): LVEF < 20%, severely dilated internal cavity. Normal RV. No significant valvular disease, no ASD. Chest CT (12/16/24): No significant aortic calcium  PFT (TBD): TBD  Past Medical and Surgical History: Previous Chest Surgery: No Previous Chest Radiation: No Diabetes Mellitus: Yes.  HbA1C 7.7% (01/11/24) Anticoagulation: Plavix , Last dose 12/15/24    Creatinine:  Lab Results  Component Value Date   CREATININE 1.41 (H) 12/18/2024   CREATININE 1.49 (H)  12/17/2024   CREATININE 1.54 (H) 12/16/2024     Past Medical History:  Diagnosis Date   AICD (automatic cardioverter/defibrillator) present    Anemia 12/12/2019   Anginal pain    Arthralgia 01/14/2020   Arthritis    Asthma    Atherosclerotic heart disease of native coronary artery without angina pectoris 04/13/2014   CHF (congestive heart failure) (HCC)    Chronic bilateral low back pain with bilateral sciatica 01/13/2019   Chronic bladder pain 11/08/2014   Last Assessment & Plan:  Formatting of this note might be different from the original. Patient has chronic pain syndrome in general I think this is unfortunately worsening her pelvic pain and bladder pain her examination today was very reassuring I am advised her to use the estrogen cream I did start her on Elavil 10 milligrams at that time if she does tolerated will increase it to 25 milligrams.    Chronic headaches    Chronic idiopathic constipation 12/22/2014   Formatting of this note might be different from the original. Last Assessment & Plan:  For better bowel emptying please use either Citrucel / Benefiber start with 2 tablespoons daily and titrate up or down to effect.  Also please use glycerin  suppositories as needed to assist in evacuation. Last Assessment & Plan:  Formatting of this note might be different from the original. For better bowel empt   Chronic obstructive  pulmonary disease, unspecified (HCC) 03/18/2018   CKD (chronic kidney disease) stage 3, GFR 30-59 ml/min (HCC)    Class 1 obesity due to excess calories with serious comorbidity and body mass index (BMI) of 31.0 to 31.9 in adult 06/26/2020   Colon polyps    COPD (chronic obstructive pulmonary disease) (HCC)    Coronary artery disease    DDD (degenerative disc disease), cervical 01/13/2019   Depression 11/05/2019   Diabetic polyneuropathy associated with type 2 diabetes mellitus (HCC) 02/24/2019   Diverticulitis 05/20/2018   Diverticulitis of colon 03/18/2018    Family history of ischemic heart disease (IHD) 08/16/2013   Fibromyalgia    Fibromyalgia affecting multiple sites 01/14/2020   Gastro-esophageal reflux disease without esophagitis 01/13/2019   Glaucoma 11/08/2014   Hordeolum externum of left upper eyelid 03/13/2020   Hypertension 11/08/2014   IBS (irritable bowel syndrome)    Iron deficiency anemia 01/13/2019   Left renal mass 11/08/2019   Leukocytosis 05/28/2018   Low vitamin B12 level 03/13/2020   Low vitamin D  level 12/12/2019   LV dysfunction 05/31/2019   Malignant essential hypertension 01/22/2016   Migraine, unspecified, not intractable, without status migrainosus 11/08/2014   Mixed hyperlipidemia 01/13/2019   Myocardial infarction (HCC)    Other premature beats 11/08/2014   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 Last Assessment & Plan:  For vaginal atrophy please place a pea size dab of Estrogen vaginal cream  into the vagina 3 times a week ( Monday, Wednesday, Friday) Last Assessment & Plan:  Formatting of this note might be different from the original. For vaginal atrophy please place a pea size dab of Estrogen vaginal cream  into the vagina     Past Surgical History:  Procedure Laterality Date   ABDOMINAL HYSTERECTOMY     BIV ICD INSERTION CRT-D N/A 12/31/2020   Procedure: BIV ICD INSERTION CRT-D;  Surgeon: Waddell Danelle ORN, MD;  Location: [facility] INVASIVE CV LAB;  Service: Cardiovascular;  Laterality: N/A;   BLEPHAROPLASTY Bilateral    COLON SURGERY     CORONARY ANGIOPLASTY WITH STENT PLACEMENT  2020   X3   CORONARY PRESSURE/FFR STUDY N/A 08/03/2020    Procedure: INTRAVASCULAR PRESSURE WIRE/FFR STUDY;  Surgeon: Wonda Sharper, MD;  Location: [facility] INVASIVE CV LAB;  Service: Cardiovascular;  Laterality: N/A;   CORONARY STENT INTERVENTION N/A 08/03/2020   Procedure: CORONARY STENT INTERVENTION;  Surgeon: Wonda Sharper, MD;  Location: [facility] INVASIVE CV LAB;  Service: Cardiovascular;  Laterality: N/A;   LEFT HEART CATH AND CORONARY ANGIOGRAPHY N/A 08/03/2020   Procedure: LEFT HEART CATH AND CORONARY ANGIOGRAPHY;  Surgeon: Wonda Sharper, MD;  Location: [facility] INVASIVE CV LAB;  Service: Cardiovascular;  Laterality: N/A;   RADIOFREQUENCY ABLATION  07/2023   REFRACTIVE SURGERY Left    [place]    RIGHT/LEFT HEART CATH AND CORONARY ANGIOGRAPHY N/A 10/13/2023   Procedure: RIGHT/LEFT HEART CATH AND CORONARY ANGIOGRAPHY;  Surgeon: Rolan Ezra RAMAN, MD;  Location: [facility] INVASIVE CV LAB;  Service: Cardiovascular;  Laterality: N/A;   RIGHT/LEFT HEART CATH AND CORONARY ANGIOGRAPHY N/A 12/15/2024   Procedure: RIGHT/LEFT HEART CATH AND CORONARY ANGIOGRAPHY;  Surgeon: Rolan Ezra RAMAN,  MD;  Location: [facility] INVASIVE CV LAB;  Service: Cardiovascular;  Laterality: N/A;    Social History:  Tobacco Use History[1]  Social History   Substance and Sexual Activity  Alcohol Use Never     Allergies[2]  Current Facility-Administered Medications  Medication Dose Route Frequency Provider Last Rate Last Admin   acetaminophen  (TYLENOL ) tablet 650 mg  650 mg Oral QID Nguyen, Diana, MD   650 mg at 12/18/24 0848   albuterol  (PROVENTIL ) (2.5 MG/3ML) 0.083% nebulizer solution 2.5 mg  2.5 mg Nebulization Q4H PRN Rihner, Emilie, DO       alum & mag hydroxide-simeth (MAALOX/MYLANTA) 200-200-20 MG/5ML suspension 15 mL  15 mL Oral BID Nguyen, Diana, MD   15 mL at 12/18/24 0849   aspirin  EC tablet 81 mg  81 mg Oral Daily Rihner, Emilie, DO   81 mg at 12/18/24 9152   bisacodyl  (DULCOLAX) suppository 10 mg  10 mg Rectal Daily PRN Harrie Bruckner, DO       Chlorhexidine  Gluconate  Cloth 2 % PADS 6 each  6 each Topical Daily McLean, Dalton S, MD   6 each at 12/18/24 0851   cyanocobalamin  (VITAMIN B12) tablet 1,000 mcg  1,000 mcg Oral Daily Rihner, Emilie, DO   1,000 mcg at 12/18/24 0849   cyclobenzaprine  (FLEXERIL ) tablet 5 mg  5 mg Oral TID PRN Rihner, Emilie, DO   5 mg at 12/16/24 1456   diclofenac  Sodium (VOLTAREN ) 1 % topical gel 2 g  2 g Topical QID Rihner, Emilie, DO   2 g at 12/18/24 0852   dicyclomine  (BENTYL ) 10 MG/5ML solution 15 mg  15 mg Oral TID AC & HS Eben Reyes BROCKS, MD   15 mg at 12/18/24 1728   empagliflozin  (JARDIANCE ) tablet 10 mg  10 mg Oral Daily McLean, Dalton S, MD   10 mg at 12/18/24 0848   ezetimibe  (ZETIA ) tablet 10 mg  10 mg Oral Daily Rihner, Emilie, DO   10 mg at 12/18/24 0848   folic acid  (FOLVITE ) tablet 1 mg  1 mg Oral Daily Rihner, Emilie, DO   1 mg at 12/18/24 0847   heparin  injection 5,000 Units  5,000 Units Subcutaneous Q8H McLean, Dalton S, MD   5,000 Units at 12/18/24 1434   HYDROmorphone  (DILAUDID ) tablet 1 mg  1 mg Oral Q4H PRN Nguyen, Diana, MD       insulin  aspart (novoLOG ) injection 0-15 Units  0-15 Units Subcutaneous TID WC Rihner, Emilie, DO   5 Units at 12/18/24 1107   insulin  glargine (LANTUS ) injection 25 Units  25 Units Subcutaneous BID Rihner, Emilie, DO   25 Units at 12/18/24 0902   isosorbide  mononitrate (IMDUR ) 24 hr tablet 15 mg  15 mg Oral Daily Rihner, Emilie, DO   15 mg at 12/18/24 0848   lactose free nutrition (BOOST PLUS) liquid 237 mL  237 mL Oral TID WC Eben Reyes BROCKS, MD   237 mL at 12/18/24 1058   lidocaine  (LIDODERM ) 5 % 1 patch  1 patch Transdermal Q24H Rihner, Emilie, DO   1 patch at 12/16/24 9072   loratadine  (CLARITIN ) tablet 10 mg  10 mg Oral Daily Rihner, Emilie, DO   10 mg at 12/18/24 0847   milrinone  (PRIMACOR ) 20 MG/100 ML (0.2 mg/mL) infusion  0.25 mcg/kg/min Intravenous Continuous Rolan Ezra RAMAN, MD 5.72 mL/hr at 12/18/24 1748 0.25 mcg/kg/min at 12/18/24 1748   multivitamin with minerals  tablet 1 tablet  1 tablet Oral Daily Eben Reyes BROCKS, MD   1  tablet at 12/18/24 0848   nitroGLYCERIN  (NITROSTAT ) SL tablet 0.4 mg  0.4 mg Sublingual Q5 min PRN Rihner, Emilie, DO       ondansetron  (ZOFRAN -ODT) disintegrating tablet 4 mg  4 mg Oral Q8H PRN Rihner, Emilie, DO       pantoprazole  (PROTONIX ) EC tablet 40 mg  40 mg Oral Daily Rihner, Emilie, DO   40 mg at 12/18/24 0849   polyethylene glycol (MIRALAX  / GLYCOLAX ) packet 17 g  17 g Oral BID Juberg, Christopher, DO   17 g at 12/18/24 0902   ranolazine  (RANEXA ) 12 hr tablet 500 mg  500 mg Oral BID Rihner, Emilie, DO   500 mg at 12/18/24 0848   rosuvastatin  (CRESTOR ) tablet 40 mg  40 mg Oral QHS Marcine Catalan M, PA-C   40 mg at 12/17/24 2152   senna (SENOKOT) tablet 8.6 mg  1 tablet Oral BID Juberg, Christopher, DO   8.6 mg at 12/18/24 0847   sodium chloride  flush (NS) 0.9 % injection 10-40 mL  10-40 mL Intracatheter Q12H Rolan Ezra RAMAN, MD   10 mL at 12/18/24 1058   sodium chloride  flush (NS) 0.9 % injection 10-40 mL  10-40 mL Intracatheter PRN Rolan Ezra RAMAN, MD       sodium chloride  flush (NS) 0.9 % injection 3 mL  3 mL Intravenous Q12H Rolan Ezra RAMAN, MD   3 mL at 12/17/24 2205   sodium chloride  flush (NS) 0.9 % injection 3 mL  3 mL Intravenous PRN Rolan Ezra RAMAN, MD       spironolactone  (ALDACTONE ) tablet 12.5 mg  12.5 mg Oral Daily McLean, Dalton S, MD   12.5 mg at 12/18/24 0847   Ubrogepant  TABS 50 mg  1 tablet Oral Daily PRN Lyles, Elliott, DO   50 mg at 12/18/24 0335   valACYclovir  (VALTREX ) tablet 500 mg  500 mg Oral Daily Juberg, Christopher, DO   500 mg at 12/18/24 9097   Vitamin D  (Ergocalciferol ) (DRISDOL ) 1.25 MG (50000 UNIT) capsule 50,000 Units  50,000 Units Oral Q Austin Primas, [place], DO   50,000 Units at 12/18/24 9097   vortioxetine  HBr (TRINTELLIX ) tablet 5 mg  5 mg Oral QHS Rihner, Emilie, DO   5 mg at 12/17/24 2153    Medications Prior to Admission  Medication Sig Dispense Refill Last Dose/Taking    acetaminophen  (TYLENOL ) 650 MG CR tablet Take 650 mg by mouth every 8 (eight) hours as needed for pain.   12/13/2024   acetaminophen -codeine (TYLENOL  #3) 300-30 MG tablet Take 1 tablet by mouth every 6 (six) hours as needed for moderate pain (pain score 4-6).   12/13/2024   albuterol  (PROVENTIL ) (2.5 MG/3ML) 0.083% nebulizer solution Take 2.5 mg by nebulization every 4 (four) hours as needed for wheezing or shortness of breath.   Unknown   aspirin  81 MG EC tablet Take 81 mg by mouth daily.   12/13/2024   carvedilol  (COREG ) 3.125 MG tablet Take 1 tablet (3.125 mg total) by mouth 2 (two) times daily with a meal. 180 tablet 3 12/13/2024   clopidogrel  (PLAVIX ) 75 MG tablet Take 1 tablet (75 mg total) by mouth daily. TAKE 1 TABLET(75 MG) BY MOUTH DAILY 90 tablet 2 12/13/2024   cyclobenzaprine  (FLEXERIL ) 5 MG tablet Take 5 mg by mouth 3 (three) times daily as needed for muscle spasms.   12/13/2024   dapagliflozin  propanediol (FARXIGA ) 10 MG TABS tablet TAKE 1 TABLET(10 MG) BY MOUTH DAILY BEFORE BREAKFAST 90 tablet 3 12/13/2024   dicyclomine  (BENTYL )  10 MG capsule TAKE 1 CAPSULE(10 MG) BY MOUTH IN THE MORNING AND AT BEDTIME 180 capsule 0 12/13/2024   digoxin  (LANOXIN ) 0.125 MG tablet TAKE ONE-HALF TABLET BY MOUTH EVERY DAY 45 tablet 1 12/13/2024   ezetimibe  (ZETIA ) 10 MG tablet Take 1 tablet (10 mg total) by mouth daily. 30 tablet 6 12/13/2024   famotidine  (PEPCID ) 40 MG tablet TAKE 1 TABLET(40 MG) BY MOUTH TWICE DAILY (Patient taking differently: Take 40 mg by mouth daily.) 60 tablet 2 12/13/2024   folic acid  (FOLVITE ) 1 MG tablet Take 1 mg by mouth daily.   12/13/2024   insulin  lispro (HUMALOG) 100 UNIT/ML KwikPen Inject 2-20 Units into the skin in the morning and at bedtime. per sliding scale   12/13/2024   isosorbide  mononitrate (IMDUR ) 30 MG 24 hr tablet Take 15 mg by mouth daily.   12/13/2024   lidocaine  (LIDODERM ) 5 % Place 1 patch onto the skin every 12 (twelve) hours as needed (pain). Remove & Discard patch  within 12 hours or as directed by MD   Taking As Needed   loratadine  (CLARITIN ) 10 MG tablet Take 10 mg by mouth daily.   12/13/2024   losartan  (COZAAR ) 25 MG tablet Take 0.5 tablets (12.5 mg total) by mouth daily. 45 tablet 3 12/13/2024   nitroGLYCERIN  (NITROSTAT ) 0.4 MG SL tablet DISSOLVE 1 TABLET UNDER TONGUE EVERY 5 MINUTES AS NEEDED FOR CHEST PAIN. 25 tablet 3 12/14/2024   pantoprazole  (PROTONIX ) 40 MG tablet Take 1 tablet (40 mg total) by mouth daily. TAKE 1 TABLET(40 MG) BY MOUTH DAILY 90 tablet 2 12/13/2024   Plecanatide  (TRULANCE ) 3 MG TABS Take 1 tablet (3 mg total) by mouth as needed. 30 tablet 1 Unknown   ranolazine  (RANEXA ) 500 MG 12 hr tablet Take 1 tablet (500 mg total) by mouth 2 (two) times daily. 180 tablet 3 12/13/2024   rosuvastatin  (CRESTOR ) 40 MG tablet Take 40 mg by mouth at bedtime.   12/13/2024   Semaglutide (OZEMPIC, 1 MG/DOSE, [place]) Inject 1 mg into the skin every 7 (seven) days.   12/09/2024   spironolactone  (ALDACTONE ) 25 MG tablet Take 1 tablet (25 mg total) by mouth daily. 30 tablet 3 12/13/2024   torsemide  (DEMADEX ) 20 MG tablet Take 1 tablet (20 mg total) by mouth 2 (two) times daily. 180 tablet 3 Unknown   TRESIBA FLEXTOUCH 200 UNIT/ML FlexTouch Pen Inject 26 Units into the skin 2 (two) times daily.   12/13/2024   TRINTELLIX  5 MG TABS tablet Take 5 mg by mouth at bedtime.   12/13/2024   Ubrogepant  (UBRELVY ) 50 MG TABS Take 1 tablet by mouth daily as needed (headache).   Past Week   valACYclovir  (VALTREX ) 500 MG tablet Take 500 mg by mouth daily.   12/13/2024   vitamin B-12 (CYANOCOBALAMIN ) 1000 MCG tablet Take 1,000 mcg by mouth daily.   12/13/2024   Vitamin D , Ergocalciferol , (DRISDOL ) 1.25 MG (50000 UNIT) CAPS capsule Take 50,000 Units by mouth every 7 (seven) days. sunday   12/11/2024   Continuous Glucose Sensor (DEXCOM G7 SENSOR) MISC SMARTSIG:1 Topical Every 10 Days      potassium chloride  SA (KLOR-CON  M) 20 MEQ tablet Take 1 tablet (20 mEq total) by mouth daily. (Patient not  taking: Reported on 12/14/2024) 90 tablet 3 Not Taking    Family History  Problem Relation Age of Onset   Lung cancer Mother    Coronary artery disease Mother    Diabetes Mother    Glaucoma Mother    Hypertension Mother  Colon polyps Mother    Migraines Father    Heart disease Father    Diabetes Maternal Grandmother    Diabetes Daughter    Migraines Son    Hypertension Brother      Review of Systems:   Review of Systems  Constitutional:  Positive for malaise/fatigue. Negative for weight loss.  Cardiovascular:  Positive for chest pain. Negative for palpitations and leg swelling.  Neurological:  Negative for dizziness and headaches.      Physical Exam: BP 111/81 (BP Location: Left Arm)   Pulse 97   Temp 97.8 F (36.6 C) (Oral)   Resp 20   Ht 5' 5 (1.651 m)   Wt 74.6 kg   SpO2 95%   BMI 27.37 kg/m  Physical Exam Constitutional:      Appearance: She is well-developed.  Cardiovascular:     Rate and Rhythm: Normal rate and regular rhythm.     Comments: Right and left anterior chest wall incisions well healed Pulmonary:     Breath sounds: Normal breath sounds.  Abdominal:     Palpations: Abdomen is soft.     Comments: Well healed midline laparotomy incision  Musculoskeletal:     Right lower leg: No edema.     Left lower leg: No edema.  Skin:    General: Skin is warm and dry.  Neurological:     Mental Status: She is alert.       Diagnostic Studies & Laboratory data: Cardiac Studies & Procedures   ______________________________________________________________________________________________ CARDIAC CATHETERIZATION  CARDIAC CATHETERIZATION 12/15/2024  Conclusion   Dist LAD lesion is 40% stenosed.   Ost Cx to Prox Cx lesion is 30% stenosed.   2nd Diag lesion is 70% stenosed.   2nd Mrg lesion is 40% stenosed.   LPAV lesion is 40% stenosed.   Ost LAD lesion is 40% stenosed.   1st Diag lesion is 60% stenosed.   Non-stenotic Mid LAD lesion was previously  treated.  1. Nonobstructive coronary disease.  30 cc contrast use. 2. Low filling pressures. 3. Low cardiac output by Fick and thermodilution.  Findings Coronary Findings Diagnostic  Dominance: Left  Left Anterior Descending Ost LAD lesion is 40% stenosed. Non-stenotic Mid LAD lesion was previously treated. Dist LAD lesion is 40% stenosed. The lesion was previously treated . Possible chronic dissection at distal stent edge, similar to prior study  First Diagonal Branch 1st Diag lesion is 60% stenosed.  Second Diagonal Branch 2nd Diag lesion is 70% stenosed.  Third Diagonal Branch  Left Circumflex Ost Cx to Prox Cx lesion is 30% stenosed.  Second Obtuse Marginal Branch 2nd Mrg lesion is 40% stenosed.  Left Posterior Atrioventricular Artery LPAV lesion is 40% stenosed.  Right Coronary Artery Vessel is small.  Intervention  No interventions have been documented.   CARDIAC CATHETERIZATION  CARDIAC CATHETERIZATION 10/13/2023  Conclusion   LPAV lesion is 40% stenosed.   2nd Mrg lesion is 30% stenosed.   Dist LAD lesion is 40% stenosed.   3rd Diag lesion is 50% stenosed.   2nd Diag lesion is 70% stenosed.   Ost Cx to Prox Cx lesion is 30% stenosed.   Non-stenotic Mid LAD lesion was previously treated.  1. Normal filling pressures. 2. Low but not markedly low cardiac output. 3. Nonobstructive coronary disease.  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  Medical management. I will not change her diuretic.  Gentle hydration  post-cath with CKD. Minimal contrast used.  Findings Coronary Findings Diagnostic  Dominance: Left  Left Anterior Descending Non-stenotic Mid LAD lesion was previously treated. Dist LAD lesion is 40% stenosed. The lesion was previously treated . Possible chronic dissection at distal stent edge, similar to prior study  Second Diagonal Branch 2nd  Diag lesion is 70% stenosed.  Third Diagonal Branch 3rd Diag lesion is 50% stenosed.  Left Circumflex Ost Cx to Prox Cx lesion is 30% stenosed.  Second Obtuse Marginal Branch 2nd Mrg lesion is 30% stenosed.  Left Posterior Atrioventricular Artery LPAV lesion is 40% stenosed.  Right Coronary Artery Vessel is small.  Intervention  No interventions have been documented.   STRESS TESTS  MYOCARDIAL PERFUSION IMAGING 08/29/2024   ECHOCARDIOGRAM  ECHOCARDIOGRAM COMPLETE 11/18/2024  Narrative ECHOCARDIOGRAM REPORT    Patient Name:   Karen Warner Date of Exam: 11/18/2024 Medical Rec #:  [id number]                 Height:       65.0 in Accession #:    [id number]                Weight:       168.0 lb Date of Birth:  10/18/1951                 BSA:          1.837 m Patient Age:    73 years                  BP:           118/68 mmHg Patient Gender: F                         HR:           85 bpm. Exam Location:  [place]  Procedure: 2D Echo, Cardiac Doppler and Color Doppler (Both Spectral and Color Flow Doppler were utilized during procedure).  Indications:    Palpitations [R00.2 (ICD-10-CM)], DOE (dyspnea on exertion) [R06.09 (ICD-10-CM)]  History:        Patient has prior history of Echocardiogram examinations, most recent 08/29/2024. CHF, CAD, AICD, COPD, Signs/Symptoms:Dyspnea; Risk Factors:Hypertension, Dyslipidemia, Diabetes and Former Smoker. EF 20-25%.  Sonographer:    Charlie Jointer RDCS Referring Phys: [id number] KARDIE TOBB   Sonographer Comments: Global longitudinal strain was attempted. IMPRESSIONS   1. Left ventricular ejection fraction, by estimation, is <20%. The left ventricle has severely decreased function. The left ventricle demonstrates global hypokinesis. The left ventricular internal cavity size was severely dilated. Left ventricular diastolic parameters are indeterminate. 2. Right ventricular systolic function is normal. The right  ventricular size is normal. There is normal pulmonary artery systolic pressure. 3. The mitral valve is normal in structure. No evidence of mitral valve regurgitation. No evidence of mitral stenosis. 4. The aortic valve is normal in structure. Aortic valve regurgitation is mild. No aortic stenosis is present. 5. The inferior vena cava is normal in size with greater than 50% respiratory variability, suggesting right atrial pressure of 3 mmHg.  FINDINGS Left Ventricle: Left ventricular ejection fraction, by estimation, is <20%. The left ventricle has severely decreased function. The left ventricle demonstrates global hypokinesis. The left ventricular internal cavity size was severely dilated. There is no left ventricular hypertrophy. Abnormal (paradoxical) septal motion, consistent with RV pacemaker. Left ventricular diastolic parameters are indeterminate. Indeterminate filling pressures.  Right Ventricle: The right ventricular size is normal.  No increase in right ventricular wall thickness. Right ventricular systolic function is normal. There is normal pulmonary artery systolic pressure. The tricuspid regurgitant velocity is 1.92 m/s, and with an assumed right atrial pressure of 3 mmHg, the estimated right ventricular systolic pressure is 17.7 mmHg.  Left Atrium: Left atrial size was normal in size.  Right Atrium: Right atrial size was normal in size.  Pericardium: There is no evidence of pericardial effusion.  Mitral Valve: The mitral valve is normal in structure. No evidence of mitral valve regurgitation. No evidence of mitral valve stenosis.  Tricuspid Valve: The tricuspid valve is normal in structure. Tricuspid valve regurgitation is trivial. No evidence of tricuspid stenosis.  Aortic Valve: The aortic valve is normal in structure. Aortic valve regurgitation is mild. No aortic stenosis is present.  Pulmonic Valve: The pulmonic valve was normal in structure. Pulmonic valve regurgitation is  not visualized. No evidence of pulmonic stenosis.  Aorta: The aortic root, ascending aorta and aortic arch are all structurally normal, with no evidence of dilitation or obstruction.  Venous: The right upper pulmonary vein is normal. The inferior vena cava is normal in size with greater than 50% respiratory variability, suggesting right atrial pressure of 3 mmHg.  IAS/Shunts: No atrial level shunt detected by color flow Doppler.   LEFT VENTRICLE PLAX 2D LVIDd:         6.90 cm   Diastology LVIDs:         6.10 cm   LV e' medial:    3.92 cm/s LV PW:         1.00 cm   LV E/e' medial:  37.8 LV IVS:        0.90 cm   LV e' lateral:   5.66 cm/s LVOT diam:     2.10 cm   LV E/e' lateral: 26.1 LV SV:         37 LV SV Index:   20 LVOT Area:     3.46 cm   RIGHT VENTRICLE             IVC RV Basal diam:  2.20 cm     IVC diam: 1.70 cm RV Mid diam:    1.70 cm RV S prime:     11.70 cm/s TAPSE (M-mode): 2.5 cm  LEFT ATRIUM           Index        RIGHT ATRIUM           Index LA diam:      3.60 cm 1.96 cm/m   RA Area:     11.90 cm LA Vol (A4C): 43.7 ml 23.79 ml/m  RA Volume:   25.50 ml  13.88 ml/m AORTIC VALVE LVOT Vmax:   67.27 cm/s LVOT Vmean:  42.033 cm/s LVOT VTI:    0.108 m  AORTA Ao Root diam: 3.00 cm Ao Asc diam:  3.10 cm Ao Desc diam: 2.10 cm  MITRAL VALVE                TRICUSPID VALVE MV Area (PHT): 5.34 cm     TR Peak grad:   14.7 mmHg MV Decel Time: 142 msec     TR Vmax:        192.00 cm/s MV E velocity: 148.00 cm/s SHUNTS Systemic VTI:  0.11 m Systemic Diam: 2.10 cm  Redell Leiter MD Electronically signed by Redell Leiter MD Signature Date/Time: 11/18/2024/5:32:01 PM    Final  ______________________________________________________________________________________________     EKG: Paced rhythm I have independently reviewed the above radiologic studies and discussed with the patient   Recent Lab Findings: Lab Results  Component Value Date   WBC  9.9 12/16/2024   HGB 12.8 12/16/2024   HCT 38.2 12/16/2024   PLT 266 12/16/2024   GLUCOSE 185 (H) 12/18/2024   CHOL 122 12/16/2024   TRIG 153 (H) 12/16/2024   HDL 48 12/16/2024   LDLCALC 43 12/16/2024   ALT 7 12/17/2024   AST 16 12/17/2024   NA 135 12/18/2024   K 4.5 12/18/2024   CL 102 12/18/2024   CREATININE 1.41 (H) 12/18/2024   BUN 15 12/18/2024   CO2 24 12/18/2024   TSH 0.851 10/19/2024   INR 1.1 12/16/2024   HGBA1C 7.7 (H) 01/11/2024    Assessment / Plan:   [name] is a very pleasant 74 year old woman who presents with end stage ischemic cardiomyopathy.  She has had a CRTD and Barostim (01/13/24) implanted but no chest surgery or chest radiation.  She is very functional, independent in her ADLs and has good family support.  Her main goal is to live longer so she can see her grandchildren grow up.  She is a former heavy smoker and overweight but not prohibitively so.  Her RV function is good and she has no significant valvular disease.  She does not have any anatomic contraindication to LVAD implant.  I think she is a good candidate for surgery.  I discussed the general nature of the procedure, including the need for general anesthesia, the incisions to be used, the use of cardiopulmonary bypass, and the use of temporary pacemaker wires and drainage tubes postoperatively with her.  We discussed the expected hospital stay, overall recovery and short and long term outcomes. I informed her of the indications, risks, benefits and alternatives.   She understands the risks include, but are not limited to death, stroke, MI, DVT/PE, bleeding, possible need for transfusion, infections, cardiac arrhythmias, as well as other organ system dysfunction including respiratory (eg: prolonged ventilation), renal, or GI complications.    I  spent 45 minutes counseling the patient face to face.  Con RAMAN Zyann Mabry 12/18/2024 7:48 PM          [1]  Social History Tobacco Use  Smoking  Status Former   Current packs/day: 0.00   Types: Cigarettes   Quit date: 2021   Years since quitting: 5.0  Smokeless Tobacco Never  [2]  Allergies Allergen Reactions   Bee Venom Anaphylaxis   Sumatriptan Shortness Of Breath    Migraine worsened   Amoxicillin-Pot Clavulanate Diarrhea and Nausea And Vomiting    GI Intolerance   Oxycodone Itching and Hives    Other reaction(s): Other (see comments) Funny feeling in head  off the edge    Buprenorphine Hcl     Other reaction(s): Itching   Clarithromycin Diarrhea    Abdominal pain   Duloxetine  Hcl Other (See Comments)    drowsiness   Hydrocodone Itching   Lactose Intolerance (Gi) Other (See Comments)    Upset stomach, gas/bloating   Liraglutide Other (See Comments)    Abdominal discomfort   Tramadol Hcl Itching   "

## 2024-12-18 NOTE — Progress Notes (Signed)
 "    Advanced Heart Failure Rounding Note  Cardiologist: Dub Huntsman, DO  [facility] Cardiologist: Dr. Rolan  Chief Complaint: A/c systolic HF w/ low output  Patient Profile   Karen Warner is a 74 y.o. female with with history of CAD and ischemic cardiomyopathy, EF <20%, CKD III and COPD admitted w/ a/c CHF, chest/abdominal pain. RHC c/w low output.   Significant events:   1/15: RHC (RA 2, PA 21/10, PCW 3,  PA sat 59%, TD CI 1.49, FICK CI 1.88, PAPi 5.5), started on milrinone            LHC patent stents, nonobstructive CAD    Subjective:    On Milrinone  0.25. Coox 59% CVP 5  Feels good. No CP or SOB  Has been seen by Dr. Drake Landing and felt to be good VAD candidate  Objective:   Weight Range: 76.3 kg Body mass index is 27.99 kg/m.   Vital Signs:   Temp:  [97.4 F (36.3 C)-98.2 F (36.8 C)] 97.6 F (36.4 C) (01/18 1103) Pulse Rate:  [81-91] 91 (01/18 1103) Resp:  [18-20] 20 (01/18 1103) BP: (97-123)/(53-79) 97/79 (01/18 1103) SpO2:  [98 %-100 %] 100 % (01/18 1103) Last BM Date : 12/13/24  Weight change: Filed Weights   12/14/24 2245  Weight: 76.3 kg    Intake/Output:   Intake/Output Summary (Last 24 hours) at 12/18/2024 1318 Last data filed at 12/18/2024 1245 Gross per 24 hour  Intake 1014.14 ml  Output 200 ml  Net 814.14 ml     Physical Exam   General:  Ambulating room No resp difficulty HEENT: normal Neck: supple. no JVD.  Cor: Regular rate & rhythm. No rubs, gallops or murmurs. Lungs: clear Abdomen: soft, nontender, nondistended.Good bowel sounds. Extremities: no cyanosis, clubbing, rash, edema Neuro: alert & orientedx3, cranial nerves grossly intact. moves all 4 extremities w/o difficulty. Affect pleasant   Telemetry   SR 80-90ss, barostim artifact, personally reviewed    Labs   CBC Recent Labs    12/16/24 0334  WBC 9.9  HGB 12.8  HCT 38.2  MCV 94.8  PLT 266   Basic Metabolic Panel Recent Labs    98/82/73 0514 12/18/24 0413  NA  135 135  K 4.0 4.5  CL 100 102  CO2 26 24  GLUCOSE 108* 185*  BUN 16 15  CREATININE 1.49* 1.41*  CALCIUM  8.9 8.9   Liver Function Tests Recent Labs    12/17/24 0514  AST 16  ALT 7  ALKPHOS 62  BILITOT 0.4  PROT 6.8  ALBUMIN 3.7   No results for input(s): LIPASE, AMYLASE in the last 72 hours.  Cardiac Enzymes No results for input(s): CKTOTAL, CKMB, CKMBINDEX, TROPONINI in the last 72 hours.  BNP: BNP (last 3 results) Recent Labs    09/01/24 1457  BNP 169.4*    ProBNP (last 3 results) Recent Labs    12/13/24 1529 12/14/24 0826  PROBNP 1,342.0* 1,539.0*     D-Dimer No results for input(s): DDIMER in the last 72 hours. Hemoglobin A1C No results for input(s): HGBA1C in the last 72 hours. Fasting Lipid Panel Recent Labs    12/16/24 1449  CHOL 122  HDL 48  LDLCALC 43  TRIG 153*  CHOLHDL 2.5   Medications:   Scheduled Medications:  acetaminophen   650 mg Oral QID   alum & mag hydroxide-simeth  15 mL Oral BID   aspirin  EC  81 mg Oral Daily   Chlorhexidine  Gluconate Cloth  6 each Topical  Daily   cyanocobalamin   1,000 mcg Oral Daily   diclofenac  Sodium  2 g Topical QID   dicyclomine   15 mg Oral TID AC & HS   empagliflozin   10 mg Oral Daily   ezetimibe   10 mg Oral Daily   folic acid   1 mg Oral Daily   heparin   5,000 Units Subcutaneous Q8H   insulin  aspart  0-15 Units Subcutaneous TID WC   insulin  glargine  25 Units Subcutaneous BID   isosorbide  mononitrate  15 mg Oral Daily   lactose free nutrition  237 mL Oral TID WC   lidocaine   1 patch Transdermal Q24H   loratadine   10 mg Oral Daily   multivitamin with minerals  1 tablet Oral Daily   pantoprazole   40 mg Oral Daily   polyethylene glycol  17 g Oral BID   ranolazine   500 mg Oral BID   rosuvastatin   40 mg Oral QHS   senna  1 tablet Oral BID   sodium chloride  flush  10-40 mL Intracatheter Q12H   sodium chloride  flush  3 mL Intravenous Q12H   spironolactone   12.5 mg Oral Daily    valACYclovir   500 mg Oral Daily   Vitamin D  (Ergocalciferol )  50,000 Units Oral Q Sun   vortioxetine  HBr  5 mg Oral QHS    Infusions:  milrinone  0.25 mcg/kg/min (12/18/24 1057)    PRN Medications: albuterol , bisacodyl , cyclobenzaprine , HYDROmorphone , nitroGLYCERIN , ondansetron , sodium chloride  flush, sodium chloride  flush, Ubrogepant   Assessment/Plan   1. Acute on chronic systolic CHF: Ischemic cardiomyopathy.  [employer] CRT-D device.  Echo in 8/24 showed EF < 20%, moderate LV dilation, mild LVH, RV normal.  This is somewhat worse than priors. LHC/RHC in 11/24 showed low CI at 2.12, normal filling pressures, unchanged coronary anatomy with no severe stenoses. S/p barostimulation activation therapy device 2/25. Echo from [facility] showed EF 20-25%, paradoxical LV motion. Echo 12/25 showed EF<20%, normal RV.  We have been concerned about possible low output HF, I wonder if this could be contributing to her GI symptoms.  RHC/LHC this admit showed stable coronary anatomy and low filling pressures but low output by Fick and thermodilution.  - On milrinone  0.25 mcg/kg/min Co-ox 59% today on milrinone , CVP 5 - Continue Milrinone  0.25 mcg/kg/min - Volume ok. Continue lasix  - Continue Jardiance .  - Spironolactone  and losartan  held w/ elevated creatinine, can likely resume as SCr improves - Hold Coreg  with low output.  - Digoxin  held with elevated level (1.2), can eventually restart at 0.0625 mg every other day.  - She needs evaluation for advanced therapies (LVAD). LVAD coordinators aware. Family meeting planned for Monday for GOC discussion  - Has been seen by Dr. Rainy Rothman and felt to be good VAD candidate. (D/w Dr. Shayleigh Bouldin personally) 2.  CAD: PCI to mid/distal LAD in 2020 and proximal LAD in 2021.  LHC in 11/24 showed stable coronary anatomy with no severe stenoses. She reported to the ER with 2 days of atypical chest pain.  TnT minimally elevated with no trend, this is probably due to demand  ischemia.  Cath this admit showed stable coronary anatomy.  - Continue ranolazine  500 mg bid.  - Continue ASA, hold Plavix  for now for LVAD consideration.  - Continue Crestor  + Zetia , goal LDL < 55. - No s/s angina 3. COPD: History of smoking, quit 2021.   4. CKD 3: Follows with nephology at [facility]. Creatinine improved today, 1.9>>1.5 > 1.49 - continue milrinone  per above  - Hold losartan   and spironolactone  w/ soft BP .  - Can continue Jardiance  5. Obesity: - Has been on semaglutide. 6. PAD: Moderately decreased ABI on right in 7/25.  No claudication.  - She saw Dr. Serene, plan medical management.  7. GI: RUQ and epigastric pain.  No evidence by US  for acute cholecystitis.  GI symptoms could be a manifestation of CHF but symptoms not improving despite milrinone  support - seen by GI. No need for further w/u at this time  Length of Stay: 3  Toribio Fuel, MD  12/18/2024, 1:18 PM  Advanced Heart Failure Team Pager [phone] (M-F; 7a - 5p)   Please visit [email or web address]: For overnight coverage please call cardiology fellow first. If fellow not available call Shock/ECMO MD on call.  For ECMO / Mechanical Support (Impella, IABP, LVAD) issues call Shock / ECMO MD on call.    "

## 2024-12-18 NOTE — Progress Notes (Signed)
 Mobility Specialist Progress Note:    12/18/24 1527  Mobility  Activity Ambulated with assistance  Level of Assistance Standby assist, set-up cues, supervision of patient - no hands on  Assistive Device Other (Comment) (IV Pole)  Distance Ambulated (ft) 300 ft  Range of Motion/Exercises Active  Activity Response Tolerated well  Mobility Referral Yes  Mobility visit 1 Mobility  Mobility Specialist Start Time (ACUTE ONLY) 1527  Mobility Specialist Stop Time (ACUTE ONLY) 1545  Mobility Specialist Time Calculation (min) (ACUTE ONLY) 18 min   Received pt sitting EOB agreeable to session. No c/o any symptoms. Pt moving and ambulating well w/ no assist or AD. Returned pt to room then restroom w/ all needs met and daughters in room.   Venetia Keel Mobility Specialist Please Neurosurgeon or Rehab Office at [phone]

## 2024-12-18 NOTE — Plan of Care (Signed)
  Problem: Coping: Goal: Ability to adjust to condition or change in health will improve Outcome: Progressing   Problem: Fluid Volume: Goal: Ability to maintain a balanced intake and output will improve Outcome: Progressing   Problem: Health Behavior/Discharge Planning: Goal: Ability to manage health-related needs will improve Outcome: Progressing   Problem: Metabolic: Goal: Ability to maintain appropriate glucose levels will improve Outcome: Progressing

## 2024-12-18 NOTE — Progress Notes (Signed)
 "  HD#3 SUBJECTIVE:  Patient Summary: Karen Warner is a 74 y.o. with a pertinent PMH of CAD, pacemaker placement, [place] stimulator, ischemic cardiomyopathy, COPD, fibromyalgia, CKD who presented with CP and admitted for cardiac cath. Cardiac cath revealed low cardiac output with low filing pressures and stable coronary anatomy. Advanced Heart Failure onboard and LVAD coordinators consulted.   Overnight Events: none  Interim History: This morning she feels yucky that she attributes to a migraine last night and a dose of ubrelvy  that causes drowsiness for her. She also notes that she has not had a bowel movement yet and would like to go, but is not sure about trying a suppository/enema. Regarding her pain, her chest pain is improving only noting a little tightness. She has some abdominal tenderness near the location of injections. She was able to get on her feet this weekend.  OBJECTIVE:  Vital Signs: Vitals:   12/17/24 2335 12/18/24 0351 12/18/24 0743 12/18/24 1103  BP: (!) 116/53 123/72 123/68 97/79  Pulse: 85  84 91  Resp: 18 18 18 20   Temp: (!) 97.4 F (36.3 C) 97.7 F (36.5 C) 98.2 F (36.8 C) 97.6 F (36.4 C)  TempSrc: Oral Oral Oral Oral  SpO2: 98%  99% 100%  Weight:      Height:       Supplemental O2: Room Air SpO2: 100 %  Filed Weights   12/14/24 2245  Weight: 76.3 kg     Intake/Output Summary (Last 24 hours) at 12/18/2024 1156 Last data filed at 12/18/2024 1100 Gross per 24 hour  Intake 1014.14 ml  Output 100 ml  Net 914.14 ml   Net IO Since Admission: 1,802.14 mL [12/18/24 1156]  Physical Exam: Physical Exam Constitutional:      General: She is not in acute distress.    Appearance: She is not ill-appearing, toxic-appearing or diaphoretic.  Cardiovascular:     Heart sounds: Normal heart sounds. No murmur heard.    No friction rub. No gallop.  Pulmonary:     Effort: Pulmonary effort is normal. No respiratory distress.     Breath sounds:  Normal breath sounds. No decreased breath sounds, wheezing, rhonchi or rales.  Abdominal:     General: Bowel sounds are normal.     Palpations: Abdomen is soft.     Tenderness: There is abdominal tenderness (LLQ under bruising/injection site) in the left lower quadrant. There is no guarding.     Comments: Knot felt under bruising/injection site on LLQ  Musculoskeletal:     Right lower leg: No edema.     Left lower leg: No edema.  Skin:    General: Skin is warm and dry.  Neurological:     Mental Status: She is alert.     Patient Lines/Drains/Airways Status     Active Line/Drains/Airways     Name Placement date Placement time Site Days   Peripheral IV 12/14/24 20 G 1 Left Antecubital 12/14/24  0800  Antecubital  4   PICC Double Lumen 12/15/24 Right Basilic 37 cm 0 cm 12/15/24  8468  -- 3            Pertinent labs and imaging:      Latest Ref Rng & Units 12/16/2024    3:34 AM 12/15/2024   11:25 AM 12/15/2024    4:33 AM  CBC  WBC 4.0 - 10.5 K/uL 9.9   10.0   Hemoglobin 12.0 - 15.0 g/dL 87.1  86.6    87.0  12.7  Hematocrit 36.0 - 46.0 % 38.2  39.0    38.0  38.4   Platelets 150 - 400 K/uL 266   256        Latest Ref Rng & Units 12/18/2024    4:13 AM 12/17/2024    5:14 AM 12/16/2024    3:34 AM  CMP  Glucose 70 - 99 mg/dL 814  891  849   BUN 8 - 23 mg/dL 15  16  21    Creatinine 0.44 - 1.00 mg/dL 8.58  8.50  8.45   Sodium 135 - 145 mmol/L 135  135  135   Potassium 3.5 - 5.1 mmol/L 4.5  4.0  3.8   Chloride 98 - 111 mmol/L 102  100  99   CO2 22 - 32 mmol/L 24  26  24    Calcium  8.9 - 10.3 mg/dL 8.9  8.9  9.5   Total Protein 6.5 - 8.1 g/dL  6.8    Total Bilirubin 0.0 - 1.2 mg/dL  0.4    Alkaline Phos 38 - 126 U/L  62    AST 15 - 41 U/L  16    ALT 0 - 44 U/L  7      No results found.   ASSESSMENT/PLAN:  Assessment: Principal Problem:   Chest pain Active Problems:   Abdominal pain   Gastroesophageal reflux disease   Diabetes mellitus (HCC)   Acute on chronic  systolic heart failure (HCC)   Decompensated heart failure with reduced ejection fraction (HFrEF) (HCC)   Plan: #Acute on Chronic HF --> Low output HF CAD Ischemic cardiomyopathy with [employer] CRT-D device and Barostim device  Chest Pain  L and R Heart Cath 12/15/2024: low cardiac output with low filling pressures. Stable coronary anatomy. Low output HF. Advanced HF team consulting. LVAD coordinators consulted and will speak again with patient on 1/19. PICC line in place for CVP and co-ox. Patient not a heart transplant candidate. Last CVP 5. CoOx O2 59.4 - f/u LVAD consult  - appreciate cardiology recommendations:   - milrinone  infusion 0.25 mcg/kg/min  - Jardiance  10 mg daily  - spironolactone  12.5 mg daily    - ASA 81 mg  - Ezetimibe  10 mg and Crestor  40 mg daily, goal <55   - Imdur  15 mg daily   - holding Plavix  with LVAD consideration   - holding coreg  given low output state   - holding losartan    - holding digoxin  given elevated level; may restart at 0.0625 mg every other day once renal function stabilizes.   - strict I/O and daily weights Recommendations for CP  - continue Ranolazine  500 mg BID  - continue nitroglycerin  PRN  #Abdominal Pain  RUQ US  and CT abdomen/pelvic notes GB sludge but no acute cholecystitis. LFTs unremarkable. Symptoms may be related to low output HF however has not been improving with cardiac interventions. Dicyclomine  increased, caution for ADR with increase mostly anti-cholinergic, however can also be associated with dizziness, palpitations, or arrhythmia. GI evaluation do not suspect acute process or intervention. Has not had a BM since arrival. Will increase bowel regimen.  - continue protonix  40 mg daily  - continue bentyl  15 mg TID - continue maalox/mylanta bid - miralax  BID - senna BID - closely monitor for anticholinergic symptoms associated with bentyl  increase.   #AKI on CKD, resolved Baseline Cr around 1.5. Current: 1.41. on admit,  1.93. Likely cardiorenal in setting of low output HF - monitor daily BMP - avoid nephrotoxic medications  -  renally dose medications   Chronic pain/fibromyalgia  Chest, neck, back pain reproducible on exam. Mostly R>L. No signs of infection  - continue Tylenol  PRN - continue Flexeril  5 mg TID PRN - continue lidocaine  patch - continue voltaren  gel   Type 2 DM Last A1C: 6.3 (12/9) - continue lantus  25 U BID, consider increase with restarted diet  - continue SSI - continue jardiance  10 mg daily   Depression - continue trintellix  5 mg nightly   COPD -albuterol  PRN   Other home medications ordered - vitamin B12, folic acid , vit D ordered - valcyclovir 500 mg daily ordered   Best Practice: Diet: cardiac diet VTE: heparin  injection 5,000 Units Start: 12/15/24 2200 SCD's Start: 12/15/24 2122 Code: Full  Disposition planning: Therapy Recs: None, DME: none DISPO: Anticipated discharge pending clinical improvement.   Signature:  Sallyanne Benuel [facility]  11:56 AM, 12/18/2024  On Call pager [phone]  "

## 2024-12-19 ENCOUNTER — Inpatient Hospital Stay (HOSPITAL_COMMUNITY)

## 2024-12-19 DIAGNOSIS — J449 Chronic obstructive pulmonary disease, unspecified: Secondary | ICD-10-CM | POA: Diagnosis not present

## 2024-12-19 DIAGNOSIS — E119 Type 2 diabetes mellitus without complications: Secondary | ICD-10-CM

## 2024-12-19 DIAGNOSIS — Z01810 Encounter for preprocedural cardiovascular examination: Secondary | ICD-10-CM | POA: Diagnosis not present

## 2024-12-19 DIAGNOSIS — I255 Ischemic cardiomyopathy: Secondary | ICD-10-CM | POA: Diagnosis not present

## 2024-12-19 DIAGNOSIS — I2510 Atherosclerotic heart disease of native coronary artery without angina pectoris: Secondary | ICD-10-CM | POA: Diagnosis not present

## 2024-12-19 DIAGNOSIS — Z7984 Long term (current) use of oral hypoglycemic drugs: Secondary | ICD-10-CM | POA: Diagnosis not present

## 2024-12-19 DIAGNOSIS — F32A Depression, unspecified: Secondary | ICD-10-CM | POA: Diagnosis not present

## 2024-12-19 DIAGNOSIS — M542 Cervicalgia: Secondary | ICD-10-CM | POA: Diagnosis not present

## 2024-12-19 DIAGNOSIS — R1032 Left lower quadrant pain: Secondary | ICD-10-CM

## 2024-12-19 DIAGNOSIS — I5023 Acute on chronic systolic (congestive) heart failure: Secondary | ICD-10-CM | POA: Diagnosis not present

## 2024-12-19 DIAGNOSIS — Z794 Long term (current) use of insulin: Secondary | ICD-10-CM | POA: Diagnosis not present

## 2024-12-19 DIAGNOSIS — K219 Gastro-esophageal reflux disease without esophagitis: Secondary | ICD-10-CM | POA: Diagnosis not present

## 2024-12-19 LAB — COMPREHENSIVE METABOLIC PANEL WITH GFR
ALT: 8 U/L (ref 0–44)
AST: 18 U/L (ref 15–41)
Albumin: 3.3 g/dL — ABNORMAL LOW (ref 3.5–5.0)
Alkaline Phosphatase: 54 U/L (ref 38–126)
Anion gap: 9 (ref 5–15)
BUN: 13 mg/dL (ref 8–23)
CO2: 21 mmol/L — ABNORMAL LOW (ref 22–32)
Calcium: 7.8 mg/dL — ABNORMAL LOW (ref 8.9–10.3)
Chloride: 110 mmol/L (ref 98–111)
Creatinine, Ser: 1.34 mg/dL — ABNORMAL HIGH (ref 0.44–1.00)
GFR, Estimated: 41 mL/min — ABNORMAL LOW
Glucose, Bld: 103 mg/dL — ABNORMAL HIGH (ref 70–99)
Potassium: 3.8 mmol/L (ref 3.5–5.1)
Sodium: 140 mmol/L (ref 135–145)
Total Bilirubin: 0.3 mg/dL (ref 0.0–1.2)
Total Protein: 6.2 g/dL — ABNORMAL LOW (ref 6.5–8.1)

## 2024-12-19 LAB — CBC
HCT: 35.1 % — ABNORMAL LOW (ref 36.0–46.0)
Hemoglobin: 11.5 g/dL — ABNORMAL LOW (ref 12.0–15.0)
MCH: 31.8 pg (ref 26.0–34.0)
MCHC: 32.8 g/dL (ref 30.0–36.0)
MCV: 97 fL (ref 80.0–100.0)
Platelets: 279 K/uL (ref 150–400)
RBC: 3.62 MIL/uL — ABNORMAL LOW (ref 3.87–5.11)
RDW: 13.4 % (ref 11.5–15.5)
WBC: 13.7 K/uL — ABNORMAL HIGH (ref 4.0–10.5)
nRBC: 0.3 % — ABNORMAL HIGH (ref 0.0–0.2)

## 2024-12-19 LAB — COOXEMETRY PANEL
Carboxyhemoglobin: 1.1 % (ref 0.5–1.5)
Carboxyhemoglobin: 1.4 % (ref 0.5–1.5)
Carboxyhemoglobin: 1.4 % (ref 0.5–1.5)
Methemoglobin: <0.7 % (ref 0.0–1.5)
Methemoglobin: <0.7 % (ref 0.0–1.5)
Methemoglobin: <0.7 % (ref 0.0–1.5)
O2 Saturation: 47.2 %
O2 Saturation: 45.8 %
O2 Saturation: 52.4 %
Total hemoglobin: 11.9 g/dL — ABNORMAL LOW (ref 12.0–16.0)
Total hemoglobin: 12.1 g/dL (ref 12.0–16.0)
Total hemoglobin: 11.8 g/dL — ABNORMAL LOW (ref 12.0–16.0)

## 2024-12-19 LAB — BASIC METABOLIC PANEL WITH GFR
Anion gap: 10 (ref 5–15)
BUN: 13 mg/dL (ref 8–23)
CO2: 24 mmol/L (ref 22–32)
Calcium: 9.3 mg/dL (ref 8.9–10.3)
Chloride: 100 mmol/L (ref 98–111)
Creatinine, Ser: 1.54 mg/dL — ABNORMAL HIGH (ref 0.44–1.00)
GFR, Estimated: 35 mL/min — ABNORMAL LOW
Glucose, Bld: 199 mg/dL — ABNORMAL HIGH (ref 70–99)
Potassium: 4.5 mmol/L (ref 3.5–5.1)
Sodium: 134 mmol/L — ABNORMAL LOW (ref 135–145)

## 2024-12-19 LAB — HEMOGLOBIN A1C
Hgb A1c MFr Bld: 6.6 % — ABNORMAL HIGH (ref 4.8–5.6)
Mean Plasma Glucose: 142.72 mg/dL

## 2024-12-19 LAB — GLUCOSE, CAPILLARY
Glucose-Capillary: 213 mg/dL — ABNORMAL HIGH (ref 70–99)
Glucose-Capillary: 134 mg/dL — ABNORMAL HIGH (ref 70–99)
Glucose-Capillary: 184 mg/dL — ABNORMAL HIGH (ref 70–99)
Glucose-Capillary: 163 mg/dL — ABNORMAL HIGH (ref 70–99)

## 2024-12-19 LAB — LACTIC ACID, PLASMA
Lactic Acid, Venous: 0.8 mmol/L (ref 0.5–1.9)
Lactic Acid, Venous: 0.9 mmol/L (ref 0.5–1.9)

## 2024-12-19 MED ORDER — ORAL CARE MOUTH RINSE: 15.0000 mL | OROMUCOSAL | Status: DC | PRN

## 2024-12-19 MED ORDER — DICYCLOMINE HCL 10 MG PO CAPS
10.0000 mg | ORAL_CAPSULE | Freq: Three times a day (TID) | ORAL | Status: DC
  Administered 2024-12-19 – 2024-12-22 (×11): 10 mg via ORAL
  Filled 2024-12-19 (×12): qty 1

## 2024-12-19 MED ORDER — ACETAMINOPHEN 325 MG PO TABS
650.0000 mg | ORAL_TABLET | Freq: Four times a day (QID) | ORAL | Status: DC | PRN
  Administered 2024-12-20 – 2024-12-23 (×3): 650 mg via ORAL
  Filled 2024-12-19 (×4): qty 2

## 2024-12-19 NOTE — Progress Notes (Signed)
 LVAD Initial Psychosocial Screening  Date/Time Initiated:  12/19/24 at 9:45am Referral Source:  LVAD Coordinators Referral Reason:  LVAD Initial Psychosocial Screening Source of Information:  Patient  Demographics Name:  Dayne Dekay Address:  [address] [place], [place] [address] Home phone:  [phone] (home)    Marital Status: Significant Other  Faith:  Christian- Methodist Primary Language:  English Last 4 # SS: [phone]  DOB: 01/03/1951   Psychological Health Appearance:  In hospital gown Mental Status:  Alert, oriented Eye Contact:  Good Thought Content:  Coherent Speech:  Logical/coherent Mood:  Appropriate and Good Spirit  Affect:  Appropriate to circumstance Insight:  Good Judgement: Unimpaired Interaction Style:  Engaged and Talkative   Family/Social Information Who lives in your home? Name: Issac  Relationship: Significant other Ay-Janiai    Step-daughter   Other family members/support persons in your life? Name: Dorn Bohr  Relationship: Son [name]     Daughter in [place]     Daughter Helayne [name]      Son Wadie       Brother   Community Are you active with community agencies/resources/homecare? No  Are you active in a church, synagogue, mosque or other faith based community? Yes Faith based institutions name: [facility] Are you active in any clubs or social organizations? no What do you do for fun?  Hobbies?  Interests? Enjoys adult coloring books, cooking meals, interested in photography, likes to travel   Home Environment/Personal Care Do you have reliable phone service? Yes  If so, what is the number?  [phone]  Do you own or rent your home? Paying mortgage Number of steps into the home? 4 How many levels in the home? 1 Electrical needs for LVAD (3 prong outlets)? yes Any structural concerns with the home? (I.e. roof, flooring) no Any concerns with clutter in the home? Do you have clear pathways  for walking? Reports some clutter but nothing that affects ability to walk around the house safely Any sanitary concerns in the home? (Difficulty to clean, pests, animals) no Second hand smoke exposure in the home? No- SO smokes but always does so outside the home Travel distance from [facility]? 35-40 minutes   Financial Information What is your source of income? Retirement income and pension- gets $2,100 from retirement income and about $1,900 from pension Do you have difficulty meeting your monthly expenses? Not really- states able to keep up with all important expenses like mortgage and utilities but does admit to credit card debt  Can you budget for the monthly cost for dressing supplies post procedure? Yes   Primary Health insurance:  AARP/Medicare Have you ever had to refuse medication due to cost?  No Do you use mail order for your prescriptions?  No Have you applied for Medicaid?  No   Education/Work Information What is the last grade of school you completed? 11th- dropped out in her senior year to give birth to her son but later got her GED and has had numerous other specialized trainings for her work Do you have any problems with reading or writing?  No Preferred method of learning?  Written, Verbal, and Hands on Are you currently employed?  No   If not currently employed when did you last work? 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 No    Advance Directives: Do you have a Living Will or Medical POA?  Yes   Have you had a consult with the Palliative Care Team at [facility]? No   Legal Do you currently have any legal issues/problems?  no Have you had any legal issues/problems in the past?  no  Medical Information Do you have any family history of heart problems? Mother had bypass surgery and father has afib  Do you have a PCP or other medical provider? Dr. Lamar Simpers Are you able to  complete your ADL's?  yes Do you have any assistive devices in the home? no How are you currently managing your medications at home? Takes out of the bottle How many hours do you sleep at night?  was diagnosed with sleep apnea and couldn't sleep with bipap so it was taken away- sleep has not been consistent How is your appetite? Has lowered appetite but reports this has been the case since starting ozempic Do you smoke now or past usage? past usage    Quit date: 2020 Do you drink alcohol now or past usage?used to drink socially but has not had a drink in some time Are you currently using illegal drugs or misuse of medication or past usage? Had experimented with pot in the past but has been a very long time since she tried Have you ever been treated for substance abuse? No   Mental Health History How have you been feeling in the past year? States she has been feeling well over the past year with not bouts of depression.  PHQ2 Depression Scale: 0  Have you ever had any problems with depression, anxiety or other mental health concerns? Yes- experienced strong feelings of depression in 2003 after the sudden lost of her significant other. Have you or are you taking medications for anxiety/depression or any mental health concerns?  Yes  Current Medications: trintellix  Do you have a history of a traumatic event? Feels as if the sudden loss of her significant other was traumatic for her.  States that he went to get her medication because she was ill and had a massive heart attack and never came. Have you had any past or current thoughts of suicide? no Are there any other current stressors in your life?  no Do you see a counselor, psychiatrist or therapist?  Sees a counselor 1x per week What are your coping strategies under stressful situations? States if she is struggling with something she will reach out to therapist to book an extra session or will get a change of scenery.  Would you be interested  in attending the LVAD support group? yes   Medical & Follow-up Do you take your medications as prescribed by the doctors? yes Do you feel as if you have a good understanding of your medications and what they are for? yes If you experience medical concerns or barriers to care in between appointments how do you manage this? Reaches out to medical providers as needed No Show Rate Percentage: 3%   Plan for VAD Implementation Do you know and understand what happens during the VAD surgery? Patient Verbalizes Understanding  of surgery and able to describe details What do you know about the risks and side effect associated with VAD surgery? Patient Verbalizes Understanding  of risks (infection, stroke and death) Explain what will happen right after surgery: Patient Verbalizes Understanding  of OR to ICU and will be intubated  What is your plan for transportation for the first 8 weeks post-surgery? (Patients are not recommended to drive post-surgery for 8 weeks)  Driver:  Jon Pouch (dtr)  What do you know about your diet post-surgery? Patient Verbalizes Understanding  of Heart healthy How do you plan to complete ADL's post-surgery?  With assistance from caregivers as needed Will it be difficult to ask for help from your caregivers?  No- has close relationship with family and no concerns asking for help as needed.  Please explain what you hope will be improved about your life as a result of receiving the LVAD? Hopeful to feel healthy.  Feels like she retired and then has had continuous health issues.  Wanting to feel well so she can live her life and pursue the things she wanted to do in retirement. Please tell me your biggest concern or fear about living with the LVAD?  Some concern about the equipment- gets migranes when she has neck pressure/pain so worried about having to carry everything around.   Please explain your understanding of how your body will change?  Understands she will have  driveline coming out of her abdomen and will have to carry equipment around with her. Are you worried about these changes? Not very if this is what is necessary to improve/extend her life. Do you see any barriers to your surgery or follow-up? no  Understanding of LVAD Patient states understanding of the following: Surgical procedures and risks, Electrical need for LVAD (3 prong outlets), Safety precautions with LVAD (water, etc.), LVAD daily self-care (dressing changes, computer check, extra supplies), Outpatient follow up (LVAD clinic appts, monitoring blood thinners), and Need for Emergency Planning  Discussed and Reviewed with Patient and Caregiver  Patient is motivated to proceed with LVAD during this admission if needed.   Caregiving Needs Who is the primary caregiver? Jon Pouch (dtr) Health status:  some health concerns but nothing that would affect her ability to caregive Do you drive?  yes Do you work?  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 Physical Limitations:  none Do you have other care giving responsibilities?  no Contact number:[phone]  Who is the secondary caregiver? [name] (dtr in law) Health status:  good Do you drive?  yes Do you work?  No- retired  Physical Limitations:  none Do you have other care giving responsibilities?  no Contact number: [phone]     Education provided to patient/family/caregiver:   Caregiver role and responsibiltiy, Financial planning for LVAD, Role of Clinical Social Worker, and Signs of Depression and Anxiety    Discussed and Reviewed with Patient and Caregiver  Caregiver questions Please explain what you hope will be improved about your life and loved ones life as a result of receiving the LVAD?  Everything. Wants her to have improved qualify of life and time with her family- has great great grandchildren.  What is your biggest concern or fear about caregiving with an LVAD patient?  None  What is  your plan for availability to provide care 24/7 x2 weeks post op and dressing changes ongoing?  Jon currently lives in [place]  but plans to move to  permanently and move in with patient to assist with caregiving.  Has provided notice to her job that she will only be available in remote positions and would take FMLA during recovery period.    Who is the relief/backup caregiver and what is their availability?  [name].  She is daughter in law and lives next door to patient.  Is retired and has no set commitments outside the home so would be able to assist as needed.  Preferred method of  learning? Written and Hands on  Do you drive? yes How do you handle stressful situations?  Will lean on family if things get tough. Do you think you can do this? yes Is there anything that concerns about caregiving?  no Do you provide caregiving to anyone else?  no  Caregivers current level of motivation to prepare for LVAD: fully motivated to proceed  Clinical Interventions Needed:     CSW will monitor signs and symptoms of depression and assist with adjustment to life with an LVAD.  CSW will refer patient for Advanced Directives, if not completed prior to surgery if still wishing to complete.  CSW encouraged attendance with the LVAD Support Group to assist further with adjustment and post implant peer support.   Clinical Impressions/Recommendations:    CSW met with patient and multiple family members at bedside to complete LVAD initial psychosocial assessment.  Patient was alert and oriented during visit and spoke coherently and with good insight throughout.  Patient appeared well groomed naval architect, in well kept shoes and jewelry).  Ms. Leeandra Ellerson reports she lives in [place], [place] with her significant other of 16 years (Issac) and his 17yo daughter.  She states she is paying a mortgage for her home and has no structural or sanitary concerns with her residence at this time.  She reports  strong support system from her family and lives next door to her son, Ethel, and his wife.  She also reports strong support from her daughter, Jon, who lives in [place] as well as her son Helayne who lives in [place] and brother Wadie who lives in [place].  Ms. Madyson Lukach has steady source of income from her retirement and pension from her work for [employer].  She has no major financial concerns at this time other than some credit card debt but she states she is able to maintain all day to day bills.  Her significant other also works as a hospital doctor for [employer] in [employer].  She reports good insurance through [employer] and has never had issues affording medical care or medications.  History is significant for tobacco use but has been abstinent since 2020.  Reports sporatic social drinking over the years and some experimental pot use many years ago.  Has never required treatment for these concerns.  Reports no issues with mental health over the last year but has history of depression.  Reports last episode of depression was in 2003 following the unexpected death of her boyfriend at the time which greatly affected her.  Despite reporting no concerns with depression in recent years she sees a mental health counselor weekly and is on medication for depression.  Ms. Necola Bluestein seems to have very strong family support as evidenced by the many family members who attended family meeting.  Primary caregiver has been identified as her daughter Jon who lives in [place] but plans to move in with the patient to provide caregiving long term.  States she has already spoken with work and will take FMLA to be here with patient after surgery and plans to transition down to [place] long term to provide care.  Secondary caregiver has been identified as Luke the patients daughter in law who lives next door.  Both caregivers report good health, ability to drive, and flexible schedules to provide care for patient  as needed.  Both patient and family are motivated to proceed with LVAD with the hopes that it will improve patients qualify of live and increase lifespan.  They asked good questions and seem knowledgeable about LVAD procedure and realistic about life with LVAD.    CSW will continue to follow and assist as needed  Andriette HILARIO Leech, LCSW Clinical Social Worker [facility] Desk#: [phone] Cell#: [phone]

## 2024-12-19 NOTE — Progress Notes (Signed)
 [facility] EDUCATION NOTE:                Continued VAD teaching completed with pt and several family members at bedside. (Initial meeting with patient 12/16/24- see separate note for details).   VAD educational packet including Understanding Your Options with Advanced Heart Failure, [facility] Patient Agreement for VAD Evaluation and Potential Implantation consent, and Abbott Heartmate 3 Left Ventricular Device (LVAD) Patient Guide, [place] HM III Patient Education, [place] Mechanical Circulatory Support Program, and Decision Aids for Left Ventricular Assist Device reviewed in detail and left at bedside for continued reference.   All questions answered regarding VAD implant, hospital stay, and what to expect when discharged home living with a heart pump.   Explained need for 24/7 care when pt is discharged home due to sternal precautions, adaptation to living on support, emotional support, consistent and meticulous exit site care and management, medication adherence and high volume of follow up visits with the VAD Clinic after discharge.  Provided brief equipment overview and demonstration with HeartMate III model including discussion on the following:   a) mobile power unit b) system controller   c) universal magazine features editor   d) battery clips   e) Batteries   f)  Perc lock   g) Percutaneous lead    Identified the following lifestyle modifications while living on MCS:   1. No driving for at least three months and then only if doctor gives permission to do so.   2. No tub baths while pump implanted, and shower only when doctor gives permission.   3. No swimming or submersion in water while implanted with pump.   4. No contact sports or engaging in jumping activities.   5. Always have a backup controller, charged spare batteries, and battery clips nearby at all times in case of emergency.   6. Call the doctor or hospital contact person if any change in how the pump sounds, feels,  or works.   7. Plan to sleep only when connected to the power module.   8. Do not sleep on your stomach.   9. Keep a backup system controller, charged batteries, battery clips, and flashlight near you during sleep in case of electrical power outage.   10. Exit site care including dressing changes, monitoring for infection, and importance of keeping percutaneous lead stabilized at all times.     Intermacs patient survival statistics through September 2025 reviewed with patient and caregiver as follows:   All questions have been answered at this time and contact information was provided should they encounter any further questions. VAD evaluation is ongoing.    Total Session Time: 60 minutes  Isaiah Knoll RN VAD Coordinator  Office: [phone]  24/7 Pager: [phone]

## 2024-12-19 NOTE — Plan of Care (Signed)
" °  Problem: Education: Goal: Ability to describe self-care measures that may prevent or decrease complications (Diabetes Survival Skills Education) will improve Outcome: Progressing Goal: Individualized Educational Video(s) Outcome: Progressing   Problem: Coping: Goal: Ability to adjust to condition or change in health will improve Outcome: Progressing   Problem: Fluid Volume: Goal: Ability to maintain a balanced intake and output will improve Outcome: Progressing   Problem: Health Behavior/Discharge Planning: Goal: Ability to identify and utilize available resources and services will improve Outcome: Progressing Goal: Ability to manage health-related needs will improve Outcome: Progressing   Problem: Nutritional: Goal: Maintenance of adequate nutrition will improve Outcome: Progressing Goal: Progress toward achieving an optimal weight will improve Outcome: Progressing   Problem: Skin Integrity: Goal: Risk for impaired skin integrity will decrease Outcome: Progressing   Problem: Tissue Perfusion: Goal: Adequacy of tissue perfusion will improve Outcome: Progressing   Problem: Education: Goal: Understanding of CV disease, CV risk reduction, and recovery process will improve Outcome: Progressing Goal: Individualized Educational Video(s) Outcome: Progressing   Problem: Cardiovascular: Goal: Ability to achieve and maintain adequate cardiovascular perfusion will improve Outcome: Progressing Goal: Vascular access site(s) Level 0-1 will be maintained Outcome: Progressing   Problem: Health Behavior/Discharge Planning: Goal: Ability to safely manage health-related needs after discharge will improve Outcome: Progressing   Problem: Education: Goal: Knowledge of General Education information will improve Description: Including pain rating scale, medication(s)/side effects and non-pharmacologic comfort measures Outcome: Progressing   Problem: Health Behavior/Discharge  Planning: Goal: Ability to manage health-related needs will improve Outcome: Progressing   "

## 2024-12-19 NOTE — Plan of Care (Signed)
 Patient ID: [name], female   DOB: 05/25/1951, 74 y.o.   MRN: [id number]    Problem: Education: Goal: Ability to describe self-care measures that may prevent or decrease complications (Diabetes Survival Skills Education) will improve Outcome: Progressing Goal: Individualized Educational Video(s) Outcome: Progressing   Problem: Coping: Goal: Ability to adjust to condition or change in health will improve Outcome: Progressing   Problem: Fluid Volume: Goal: Ability to maintain a balanced intake and output will improve Outcome: Progressing   Problem: Health Behavior/Discharge Planning: Goal: Ability to identify and utilize available resources and services will improve Outcome: Progressing Goal: Ability to manage health-related needs will improve Outcome: Progressing   Problem: Metabolic: Goal: Ability to maintain appropriate glucose levels will improve Outcome: Progressing   Problem: Nutritional: Goal: Maintenance of adequate nutrition will improve Outcome: Progressing Goal: Progress toward achieving an optimal weight will improve Outcome: Progressing   Problem: Skin Integrity: Goal: Risk for impaired skin integrity will decrease Outcome: Progressing   Problem: Tissue Perfusion: Goal: Adequacy of tissue perfusion will improve Outcome: Progressing   Problem: Education: Goal: Understanding of CV disease, CV risk reduction, and recovery process will improve Outcome: Progressing Goal: Individualized Educational Video(s) Outcome: Progressing   Problem: Activity: Goal: Ability to return to baseline activity level will improve Outcome: Progressing   Problem: Cardiovascular: Goal: Ability to achieve and maintain adequate cardiovascular perfusion will improve Outcome: Progressing Goal: Vascular access site(s) Level 0-1 will be maintained Outcome: Progressing   Problem: Health Behavior/Discharge Planning: Goal: Ability to safely manage health-related needs  after discharge will improve Outcome: Progressing   Problem: Education: Goal: Knowledge of General Education information will improve Description: Including pain rating scale, medication(s)/side effects and non-pharmacologic comfort measures Outcome: Progressing   Problem: Health Behavior/Discharge Planning: Goal: Ability to manage health-related needs will improve Outcome: Progressing   Problem: Clinical Measurements: Goal: Ability to maintain clinical measurements within normal limits will improve Outcome: Progressing Goal: Will remain free from infection Outcome: Progressing Goal: Diagnostic test results will improve Outcome: Progressing Goal: Respiratory complications will improve Outcome: Progressing Goal: Cardiovascular complication will be avoided Outcome: Progressing   Problem: Activity: Goal: Risk for activity intolerance will decrease Outcome: Progressing   Problem: Nutrition: Goal: Adequate nutrition will be maintained Outcome: Progressing   Problem: Coping: Goal: Level of anxiety will decrease Outcome: Progressing   Problem: Elimination: Goal: Will not experience complications related to bowel motility Outcome: Progressing Goal: Will not experience complications related to urinary retention Outcome: Progressing   Problem: Pain Managment: Goal: General experience of comfort will improve and/or be controlled Outcome: Progressing   Problem: Safety: Goal: Ability to remain free from injury will improve Outcome: Progressing   Problem: Skin Integrity: Goal: Risk for impaired skin integrity will decrease Outcome: Progressing    [name], RN

## 2024-12-19 NOTE — Progress Notes (Addendum)
 "    Advanced Heart Failure Rounding Note  Cardiologist: Dub Huntsman, DO  [facility] Cardiologist: Dr. Rolan  Chief Complaint: A/c systolic HF w/ low output  Patient Profile   Karen Warner is a 74 y.o. female with with history of CAD and ischemic cardiomyopathy, EF <20%, CKD III and COPD admitted w/ a/c CHF, chest/abdominal pain. RHC c/w low output.   Significant events:   1/15: RHC (RA 2, PA 21/10, PCW 3,  PA sat 59%, TD CI 1.49, FICK CI 1.88, PAPi 5.5), started on milrinone            LHC patent stents, nonobstructive CAD    Subjective:    Co-ox 47%>>46% on 0.25 mirinone  CVP < 5.  Breathing improved but still reports abdominal pain.  Objective:   Weight Range: 74.2 kg Body mass index is 27.22 kg/m.   Vital Signs:   Temp:  [97.6 F (36.4 C)-98 F (36.7 C)] 98 F (36.7 C) (01/19 0709) Pulse Rate:  [91-97] 95 (01/19 0709) Resp:  [18-20] 20 (01/19 0709) BP: (97-131)/(61-81) 127/61 (01/19 0709) SpO2:  [95 %-100 %] 97 % (01/19 0709) Weight:  [74.2 kg-74.6 kg] 74.2 kg (01/19 0406) Last BM Date : 12/18/24  Weight change: Filed Weights   12/14/24 2245 12/18/24 1624 12/19/24 0406  Weight: 76.3 kg 74.6 kg 74.2 kg    Intake/Output:   Intake/Output Summary (Last 24 hours) at 12/19/2024 0743 Last data filed at 12/19/2024 0351 Gross per 24 hour  Intake 782.78 ml  Output 800 ml  Net -17.22 ml     Physical Exam   General:  Sitting up in bed Cor: JVP flat. Regular rate & rhythm. No murmurs. Lungs: clear Abdomen: soft, nontender, nondistended. Extremities: no edema Neuro: alert & orientedx3. Affect pleasant   Telemetry   VP 100  Labs   CBC Recent Labs    12/19/24 0349  WBC 13.7*  HGB 11.5*  HCT 35.1*  MCV 97.0  PLT 279   Basic Metabolic Panel Recent Labs    98/81/73 0413 12/19/24 0349  NA 135 134*  K 4.5 4.5  CL 102 100  CO2 24 24  GLUCOSE 185* 199*  BUN 15 13  CREATININE 1.41* 1.54*  CALCIUM  8.9 9.3   Liver Function Tests Recent  Labs    12/17/24 0514  AST 16  ALT 7  ALKPHOS 62  BILITOT 0.4  PROT 6.8  ALBUMIN 3.7   No results for input(s): LIPASE, AMYLASE in the last 72 hours.  Cardiac Enzymes No results for input(s): CKTOTAL, CKMB, CKMBINDEX, TROPONINI in the last 72 hours.  BNP: BNP (last 3 results) Recent Labs    09/01/24 1457  BNP 169.4*    ProBNP (last 3 results) Recent Labs    12/13/24 1529 12/14/24 0826  PROBNP 1,342.0* 1,539.0*     D-Dimer No results for input(s): DDIMER in the last 72 hours. Hemoglobin A1C Recent Labs    12/19/24 0349  HGBA1C 6.6*   Fasting Lipid Panel Recent Labs    12/16/24 1449  CHOL 122  HDL 48  LDLCALC 43  TRIG 153*  CHOLHDL 2.5   Medications:   Scheduled Medications:  acetaminophen   650 mg Oral QID   alum & mag hydroxide-simeth  15 mL Oral BID   aspirin  EC  81 mg Oral Daily   Chlorhexidine  Gluconate Cloth  6 each Topical Daily   cyanocobalamin   1,000 mcg Oral Daily   diclofenac  Sodium  2 g Topical QID   dicyclomine   15 mg Oral TID AC & HS   empagliflozin   10 mg Oral Daily   ezetimibe   10 mg Oral Daily   folic acid   1 mg Oral Daily   heparin   5,000 Units Subcutaneous Q8H   insulin  aspart  0-15 Units Subcutaneous TID WC   insulin  glargine  25 Units Subcutaneous BID   isosorbide  mononitrate  15 mg Oral Daily   lactose free nutrition  237 mL Oral TID WC   lidocaine   1 patch Transdermal Q24H   loratadine   10 mg Oral Daily   multivitamin with minerals  1 tablet Oral Daily   pantoprazole   40 mg Oral Daily   polyethylene glycol  17 g Oral BID   ranolazine   500 mg Oral BID   rosuvastatin   40 mg Oral QHS   senna  1 tablet Oral BID   sodium chloride  flush  10-40 mL Intracatheter Q12H   sodium chloride  flush  3 mL Intravenous Q12H   spironolactone   12.5 mg Oral Daily   valACYclovir   500 mg Oral Daily   Vitamin D  (Ergocalciferol )  50,000 Units Oral Q Sun   vortioxetine  HBr  5 mg Oral QHS    Infusions:  milrinone  0.25  mcg/kg/min (12/19/24 0500)    PRN Medications: albuterol , bisacodyl , cyclobenzaprine , HYDROmorphone , nitroGLYCERIN , ondansetron , sodium chloride  flush, sodium chloride  flush, Ubrogepant   Assessment/Plan   1. Acute on chronic systolic CHF: Ischemic cardiomyopathy.  [employer] CRT-D device.  Echo in 8/24 showed EF < 20%, moderate LV dilation, mild LVH, RV normal.  This is somewhat worse than priors. LHC/RHC in 11/24 showed low CI at 2.12, normal filling pressures, unchanged coronary anatomy with no severe stenoses. S/p barostimulation activation therapy device 2/25. Echo from [facility] showed EF 20-25%, paradoxical LV motion. Echo 12/25 showed EF<20%, normal RV.  We have been concerned about possible low output HF, I wonder if this could be contributing to her GI symptoms.  RHC/LHC this admit showed stable coronary anatomy and low filling pressures but low output by Fick and thermodilution.  - On milrinone  0.25 mcg/kg/min, coox persistently low 46% on recheck. Increase milrinone  to 0.375 mcg/kg/min. Repeat co-ox this afternoon. - Volume ok. Stop diuretics. Po tomorrow. - Continue Jardiance .  - Losartan  held w/ elevated creatinine, can likely resume as SCr improves - continue spiro 12.5 mg daily - Hold Coreg  with low output.  - Digoxin  held with elevated level (1.2), can eventually restart at 0.0625 mg every other day.  - She needs evaluation for advanced therapies (LVAD). LVAD coordinators aware. Family meeting with VAD coordinators planned for today. - Has been seen by Dr. Daniel and felt to be good VAD candidate. (D/w Dr. Daniel personally) 2.  CAD: PCI to mid/distal LAD in 2020 and proximal LAD in 2021.  LHC in 11/24 showed stable coronary anatomy with no severe stenoses. She reported to the ER with 2 days of atypical chest pain.  TnT minimally elevated with no trend, this is probably due to demand ischemia.  Cath this admit showed stable coronary anatomy.  - Continue ranolazine  500 mg bid  + imdur  - Continue ASA, hold Plavix  for now for LVAD consideration.  - Continue Crestor  + Zetia , goal LDL < 55. - No s/s angina 3. COPD: History of smoking, quit 2021.   4. CKD 3: Follows with nephology at [facility]. Creatinine stable 1.5 - continue milrinone  per above  - Hold losartan  w/ soft BP  - Can continue Jardiance  5. Obesity: - Has been on semaglutide. 6.  PAD: Moderately decreased ABI on right in 7/25.  No claudication.  - She saw Dr. Serene, plan medical management.  7. GI: RUQ and epigastric pain.  No evidence by US  for acute cholecystitis.  GI symptoms could be a manifestation of CHF but symptoms not improving despite milrinone  support - seen by GI. No need for further w/u at this time  Length of Stay: 4  Jessee Newnam N, PA-C  12/19/2024, 7:43 AM  Advanced Heart Failure Team Pager [phone] (M-F; 7a - 5p)   Please visit [email or web address]: For overnight coverage please call cardiology fellow first. If fellow not available call Shock/ECMO MD on call.  For ECMO / Mechanical Support (Impella, IABP, LVAD) issues call Shock / ECMO MD on call.    Patient seen and examined with the above-signed Advanced Practice Provider and/or Housestaff. I personally reviewed laboratory data, imaging studies and relevant notes. I independently examined the patient and formulated the important aspects of the plan. I have edited the note to reflect any of my changes or salient points. I have personally discussed the plan with the patient and/or family.  Co-ox remains low on milrinone  0.25. CVP 3-4   Denies SOB, orthopnea or PND. + multiple BMs  General:  Sitting up on side of bed. No resp difficulty HEENT: normal Neck: supple. no JVD.  Cor: Regular rate & rhythm. No rubs, gallops or murmurs. Lungs: clear Abdomen: soft, nontender, nondistended.Good bowel sounds. Extremities: no cyanosis, clubbing, rash, edema Neuro: alert & orientedx3, cranial nerves grossly intact. moves all 4 extremities w/o  difficulty. Affect pleasant  She has refractory low output despite milrinone . Will increase milrinone . Agree that she needed VAD. Family meeting with VAD team to discuss. Has been seen by Dr. Daniel and felt to be good candidate. Hopefully we can get her scheduled soon.   Toribio Fuel, MD  12:10 PM    "

## 2024-12-19 NOTE — Progress Notes (Signed)
 Pre-VAD has been completed.  Results can be found in chart review under CV Proc.  12/19/2024 6:28 PM  Mattia Liford Elden Appl, RVT.

## 2024-12-19 NOTE — Progress Notes (Signed)
 "  HD#4 SUBJECTIVE:  Patient Summary: Karen Warner is a 74 y.o. with a pertinent PMH of CAD, pacemaker placement, [place] stimulator, ischemic cardiomyopathy, COPD, fibromyalgia, CKD who presented with CP and admitted for cardiac cath. Cardiac cath revealed low cardiac output with low filing pressures and stable coronary anatomy. Advanced Heart Failure onboard and LVAD coordinators consulted.   L and R Heart Cath 12/15/2024: low cardiac output with low filling pressures. Stable coronary anatomy. Low output HF. Advanced HF team consulting. LVAD coordinators consulted and will speak again with patient on 1/19. PICC line in place for CVP and co-ox. Patient not a heart transplant candidate.   Overnight Events: none  Interim History: Assessed bedside. Patient was laying in bed comfortably. She denies significant pain, if anything feeling a bit better today then previous days. Had a bowel movement that she said was not diarrhea, but soft and she had several. Denies blood in stool or dark stool. Patients family is coming today to discuss further with LVAD   OBJECTIVE:  Vital Signs: Vitals:   12/19/24 0003 12/19/24 0325 12/19/24 0406 12/19/24 0709  BP: 131/74 124/77  127/61  Pulse: 96 95  95  Resp: 18 20  20   Temp: 97.8 F (36.6 C) 97.8 F (36.6 C)  98 F (36.7 C)  TempSrc: Oral Oral  Oral  SpO2: 100% 100%  97%  Weight:   74.2 kg   Height:       Supplemental O2: Room Air SpO2: 97 %  Filed Weights   12/14/24 2245 12/18/24 1624 12/19/24 0406  Weight: 76.3 kg 74.6 kg 74.2 kg     Intake/Output Summary (Last 24 hours) at 12/19/2024 0942 Last data filed at 12/19/2024 0351 Gross per 24 hour  Intake 782.78 ml  Output 800 ml  Net -17.22 ml   Net IO Since Admission: 1,404.92 mL [12/19/24 0942]  Physical Exam: Physical Exam Constitutional:      General: She is not in acute distress.    Appearance: She is not ill-appearing, toxic-appearing or diaphoretic.  Cardiovascular:      Heart sounds: Normal heart sounds. No murmur heard.    No friction rub. No gallop.  Pulmonary:     Effort: Pulmonary effort is normal. No respiratory distress.     Breath sounds: Normal breath sounds. No decreased breath sounds, wheezing, rhonchi or rales.  Abdominal:     General: Bowel sounds are normal.     Palpations: Abdomen is soft.     Tenderness: There is abdominal tenderness (LLQ under bruising/injection site) in the left lower quadrant. There is no guarding.     Comments: Knot felt under bruising/injection site on LLQ  Musculoskeletal:     Right lower leg: No edema.     Left lower leg: No edema.  Skin:    General: Skin is warm and dry.  Neurological:     Mental Status: She is alert.     Patient Lines/Drains/Airways Status     Active Line/Drains/Airways     Name Placement date Placement time Site Days   Peripheral IV 12/14/24 20 G 1 Left Antecubital 12/14/24  0800  Antecubital  5   PICC Double Lumen 12/15/24 Right Basilic 37 cm 0 cm 12/15/24  8468  -- 4            Pertinent labs and imaging:      Latest Ref Rng & Units 12/19/2024    3:49 AM 12/16/2024    3:34 AM 12/15/2024   11:25 AM  CBC  WBC 4.0 - 10.5 K/uL 13.7  9.9    Hemoglobin 12.0 - 15.0 g/dL 88.4  87.1  86.6    87.0   Hematocrit 36.0 - 46.0 % 35.1  38.2  39.0    38.0   Platelets 150 - 400 K/uL 279  266         Latest Ref Rng & Units 12/19/2024    3:49 AM 12/18/2024    4:13 AM 12/17/2024    5:14 AM  CMP  Glucose 70 - 99 mg/dL 800  814  891   BUN 8 - 23 mg/dL 13  15  16    Creatinine 0.44 - 1.00 mg/dL 8.45  8.58  8.50   Sodium 135 - 145 mmol/L 134  135  135   Potassium 3.5 - 5.1 mmol/L 4.5  4.5  4.0   Chloride 98 - 111 mmol/L 100  102  100   CO2 22 - 32 mmol/L 24  24  26    Calcium  8.9 - 10.3 mg/dL 9.3  8.9  8.9   Total Protein 6.5 - 8.1 g/dL   6.8   Total Bilirubin 0.0 - 1.2 mg/dL   0.4   Alkaline Phos 38 - 126 U/L   62   AST 15 - 41 U/L   16   ALT 0 - 44 U/L   7     No results  found.   ASSESSMENT/PLAN:  Assessment: Principal Problem:   Acute on chronic systolic heart failure (HCC) Active Problems:   Abdominal pain   Diabetes mellitus (HCC)   Plan: #Acute on Chronic HF --> Low output HF CAD Ischemic cardiomyopathy with [employer] CRT-D device and Barostim device  Chest Pain  Patient reports overall feeling slightly improved to relatively unchanged today. Last CVP 12. CoOx O2 45.8. Milrinone  infusion 0.25-->0.375, appreciate cardiology management. Patient is meeting with LVAD and HF team again with family bedside. Palliative care has also been consulted.  - f/u LVAD consult (RV function good and no significant valvular disease) - appreciate cardiology recommendations:   - milrinone  infusion 0.375 mcg/kg/min  - Jardiance  10 mg daily  - spironolactone  12.5 mg daily    - ASA 81 mg  - Ezetimibe  10 mg and Crestor  40 mg daily, goal <55   - Imdur  15 mg daily   - holding Plavix  with LVAD consideration   - holding coreg  given low output state   - holding losartan   - holding digoxin  given elevated level; may restart at 0.0625 mg every other day once renal function stabilizes.   - strict I/O and daily weights - Recommendations for CP  - continue Ranolazine  500 mg BID  - continue nitroglycerin  PRN  #Abdominal Pain  RUQ US  and CT abdomen/pelvic notes GB sludge but no acute cholecystitis. LFTs unremarkable. GI evaluation do not suspect acute process or intervention. Suspected to be due to low output HF. Patient had BM and believes pain may have improved slightly. Bentyl  was decreased back to 10 mg at patients request for being back on oral instead of liquid.  - continue protonix  40 mg daily  - continue bentyl  10 mg TID - continue maalox/mylanta bid - miralax  BID - senna BID  #AKI on CKD, resolved Baseline Cr around 1.5. Current: 1.54. on admit, 1.93. Likely cardiorenal in setting of low output HF.  - monitor daily BMP - avoid nephrotoxic medications   - renally dose medications   Chronic pain/fibromyalgia  Chest, neck, back pain reproducible on exam. Mostly R>L.  No signs of infection  - continue Tylenol  650 mg QID PRN - continue Flexeril  5 mg TID PRN - continue lidocaine  patch - continue voltaren  gel  - dilaudid  1 mg q 4 hours PRN - ubrogepant  50 mg daily PRN for HA  Type 2 DM 12/19/2024 A1C: 6.6 - continue lantus  25 U BID - continue SSI - continue jardiance  10 mg daily   Depression - continue trintellix  5 mg nightly   COPD -albuterol  PRN   Other home medications ordered - vitamin B12, folic acid , vit D ordered - albuterol  neb q 4 PRN  - claritin  10 mg daily  - valcyclovir 500 mg daily ordered   Best Practice: Diet: cardiac diet VTE: heparin  injection 5,000 Units Start: 12/15/24 2200 SCD's Start: 12/15/24 2122 Code: Full  Disposition planning: Therapy Recs: None, DME: none DISPO: Anticipated discharge pending clinical improvement.   Signature:  Sallyanne Benuel [facility]  9:42 AM, 12/19/2024  On Call pager [phone]  "

## 2024-12-19 NOTE — Progress Notes (Signed)
 Patient ID: Karen Warner [name], female   DOB: 04-30-1951, 74 y.o.   MRN: [id number] Co ox lab was scheduled for 12. Patient was eating lunch ant 12 and refused. Came back at 1230 and Mds, family and coordinator were preparing for a meeting and asked me to come back. Had time changed to 3pm Still in meeting at 3pm. PA updated. Will obtain asap.  [name], RN

## 2024-12-19 NOTE — Consult Note (Signed)
 "                                                                                   Consultation Note Date: 12/19/2024   Patient Name: Karen Warner  DOB: 1951/07/05  MRN: [id number]  Age / Sex: 74 y.o., female  PCP: Silver Lamar LABOR, MD Referring Physician: Shawn Sick, MD  Reason for Consultation: LVAD evaluation  HPI/Patient Profile: 74 y.o. female  with past medical history of HFrEF EF <20%, ICM, CAD, s/p [employer] CRT-D, s/p Barostim device implant, iron deficiency anemia, CKD stage 3b, COPD, fibromyalgia, diverticulosis, GERD admitted on 12/14/2024 with chest pain with acute on chronic heart failure exacerbation and low output heart failure. Palliative care consulted for LVAD evaluation.   Clinical Assessment and Goals of Care: Consult received and chart review completed. I met today at Lorenza's bedside. She is in conversation with VAD coordinator Isaiah and CSW Jenna. Discussed of VAD and expectations and care. Multiple family members at bedside. Very supportive family. Son here from [place] and other son lives beside [place]. Family ask very good and insightful questions about VAD.   I spent more time discussing with them my role from palliative care with VAD process. We further discussed options and expectations if VAD is not pursued. We discussed medical management even including milrinone  at home but these treatments have their limitations in improving quality of life and especially quantity of life. We discussed the extensive process of VAD and our desire to ensure that we have fully evaluated her health to try and avoid putting her through VAD surgery if there is something else that will impede her recovery and success with VAD.   We also spent time reviewing risks and complications. We discussed the importance of hoping for the best but also having important conversations so that family can speak on Raelan's behalf if they are ever in the position to have to do so.  We discussed the importance of knowing what quality of life is acceptable to her. We discussed the importance of knowing any hard stops or interventions that she knows she would never want to go through. Would she desire dialysis? Short term or long term? I explained that these are the more difficult conversations that they need to have. Luka and family all agree and understand the importance of these conversations. I provided them with Advance Directive for them to review and discuss further. Aretta believes that she has an Proofreader and I encouraged them to bring a copy in and we can review and update as needed  or we can go ahead and update.   At this time Kasheena and family have had a lot of information. They are processing. Rosely feels motivated towards VAD and understands that she will hear back from the team if she is determined to be a good candidate. All questions/concerns addressed at this time. I shared that there will be other opportunities for further discussion and I will continue to follow and check-in. Emotional support provided.   Primary Decision Maker PATIENT    SUMMARY OF RECOMMENDATIONS   - VAD evaluation ongoing - Seems to be good candidate with excellent  family support - Ongoing conversations regarding GOC/Advance Directives  Code Status/Advance Care Planning: Full code   Symptom Management:  Per heart failure team, attending  Prognosis:  Prognosis poor in the absence of advanced therapies  Discharge Planning: To Be Determined      Primary Diagnoses: Present on Admission:  Abdominal pain   I have reviewed the medical record, interviewed the patient and family, and examined the patient. The following aspects are pertinent.  Past Medical History:  Diagnosis Date   AICD (automatic cardioverter/defibrillator) present    Anemia 12/12/2019   Anginal pain    Arthralgia 01/14/2020   Arthritis    Asthma    Atherosclerotic heart disease of  native coronary artery without angina pectoris 04/13/2014   CHF (congestive heart failure) (HCC)    Chronic bilateral low back pain with bilateral sciatica 01/13/2019   Chronic bladder pain 11/08/2014   Last Assessment & Plan:  Formatting of this note might be different from the original. Patient has chronic pain syndrome in general I think this is unfortunately worsening her pelvic pain and bladder pain her examination today was very reassuring I am advised her to use the estrogen cream I did start her on Elavil 10 milligrams at that time if she does tolerated will increase it to 25 milligrams.    Chronic headaches    Chronic idiopathic constipation 12/22/2014   Formatting of this note might be different from the original. Last Assessment & Plan:  For better bowel emptying please use either Citrucel / Benefiber start with 2 tablespoons daily and titrate up or down to effect.  Also please use glycerin  suppositories as needed to assist in evacuation. Last Assessment & Plan:  Formatting of this note might be different from the original. For better bowel empt   Chronic obstructive pulmonary disease, unspecified (HCC) 03/18/2018   CKD (chronic kidney disease) stage 3, GFR 30-59 ml/min (HCC)    Class 1 obesity due to excess calories with serious comorbidity and body mass index (BMI) of 31.0 to 31.9 in adult 06/26/2020   Colon polyps    COPD (chronic obstructive pulmonary disease) (HCC)    Coronary artery disease    DDD (degenerative disc disease), cervical 01/13/2019   Depression 11/05/2019   Diabetic polyneuropathy associated with type 2 diabetes mellitus (HCC) 02/24/2019   Diverticulitis 05/20/2018   Diverticulitis of colon 03/18/2018   Family history of ischemic heart disease (IHD) 08/16/2013   Fibromyalgia    Fibromyalgia affecting multiple sites 01/14/2020   Gastro-esophageal reflux disease without esophagitis 01/13/2019   Glaucoma 11/08/2014   Hordeolum externum of left upper eyelid  03/13/2020   Hypertension 11/08/2014   IBS (irritable bowel syndrome)    Iron deficiency anemia 01/13/2019   Left renal mass 11/08/2019   Leukocytosis 05/28/2018   Low vitamin B12 level 03/13/2020   Low vitamin D  level 12/12/2019   LV dysfunction 05/31/2019   Malignant essential hypertension 01/22/2016   Migraine, unspecified, not intractable, without status migrainosus 11/08/2014   Mixed hyperlipidemia 01/13/2019   Myocardial infarction (HCC)    Other premature beats 11/08/2014   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 Last  Assessment & Plan:  For vaginal atrophy please place a pea size dab of Estrogen vaginal cream  into the vagina 3 times a week ( Monday, Wednesday, Friday) Last Assessment & Plan:  Formatting of this note might be different from the original. For vaginal atrophy please place a pea size dab of Estrogen vaginal cream  into the vagina    Social History   Socioeconomic History   Marital status: Significant Other    Spouse name: Not on file   Number of children: 3   Years of education: Not on file   Highest education level: Not on file  Occupational History   Occupation: retired  Tobacco Use   Smoking status: Former    Current packs/day: 0.00    Types: Cigarettes    Quit date: 2021    Years since quitting: 5.0   Smokeless tobacco: Never  Vaping Use   Vaping status: Never Used  Substance and Sexual Activity   Alcohol use: Never   Drug use: Never   Sexual activity: Not on file  Other Topics Concern   Not on file  Social History Narrative   Not on file   Social Drivers of Health    Tobacco Use: Medium Risk (12/14/2024)   Patient History    Smoking Tobacco Use: Former    Smokeless Tobacco Use: Never    Passive Exposure: Not on file  Financial Resource Strain: Low Risk (12/07/2024)   Received from [facility]   Overall Financial Resource Strain (CARDIA)    How hard is it for you to pay for the very basics like food, housing, medical care, and heating?: Not hard at all  Food Insecurity: No Food Insecurity (12/15/2024)   Epic    Worried About Radiation Protection Practitioner of Food in the Last Year: Never true    Ran Out of Food in the Last Year: Never true  Transportation Needs: No Transportation Needs (12/17/2024)   Epic    Lack of Transportation (Medical): No    Lack of Transportation (Non-Medical): No  Physical Activity: Not on file  Stress: Not on file  Social Connections: Moderately Integrated (12/17/2024)   Social Connection and Isolation Panel    Frequency of Communication with Friends and Family: More than three times a week    Frequency of Social Gatherings with Friends and Family: Twice a week    Attends Religious Services: 1 to 4 times per year    Active Member of [employer] or Organizations: No    Attends Banker Meetings: Never    Marital Status: Married  Depression (PHQ2-9): Low Risk (05/26/2024)   Depression (PHQ2-9)    PHQ-2 Score: 0  Alcohol Screen: Not on file  Housing: Low Risk (12/17/2024)   Epic    Unable to Pay for Housing in the Last Year: No    Number of Times Moved in the Last Year: 0    Homeless in the Last Year: No  Utilities: Not At Risk (12/17/2024)   Epic    Threatened with loss of utilities: No  Health Literacy: Not on file   Family History  Problem Relation Age of Onset   Lung cancer Mother    Coronary artery disease Mother    Diabetes Mother    Glaucoma Mother    Hypertension Mother    Colon polyps Mother    Migraines Father    Heart disease Father    Diabetes Maternal Grandmother    Diabetes Daughter    Migraines Son  Hypertension Brother    Scheduled Meds:  acetaminophen   650 mg Oral QID   alum & mag hydroxide-simeth  15 mL Oral BID   aspirin  EC  81 mg Oral Daily   Chlorhexidine  Gluconate Cloth  6 each Topical Daily   cyanocobalamin   1,000 mcg Oral Daily   diclofenac  Sodium  2 g Topical QID   dicyclomine   10 mg Oral TID AC   empagliflozin   10 mg Oral Daily   ezetimibe   10 mg Oral Daily   folic acid   1 mg Oral Daily   heparin   5,000 Units Subcutaneous Q8H   insulin  aspart  0-15 Units Subcutaneous TID WC   insulin  glargine  25 Units Subcutaneous BID   isosorbide  mononitrate  15 mg Oral Daily   lactose free nutrition  237 mL Oral TID WC   lidocaine   1 patch Transdermal Q24H   loratadine   10 mg Oral Daily   multivitamin with minerals  1 tablet Oral Daily   pantoprazole   40 mg Oral Daily   polyethylene glycol  17 g Oral BID   ranolazine   500 mg Oral BID   rosuvastatin   40 mg Oral QHS   senna  1 tablet Oral BID   sodium chloride  flush  10-40 mL Intracatheter Q12H   sodium chloride  flush  3 mL Intravenous Q12H   spironolactone   12.5 mg Oral Daily   valACYclovir   500 mg Oral Daily   Vitamin D  (Ergocalciferol )  50,000 Units Oral Q Sun   vortioxetine  HBr  5 mg Oral QHS   Continuous Infusions:  milrinone  0.25 mcg/kg/min (12/19/24 0500)   PRN Meds:.albuterol , bisacodyl , cyclobenzaprine , HYDROmorphone , nitroGLYCERIN , ondansetron , sodium chloride  flush, sodium chloride  flush, Ubrogepant  Allergies[1] Review of Systems  Constitutional:  Positive for fatigue.    Physical Exam Vitals and nursing note reviewed.  Constitutional:      General: She is not in acute distress.    Appearance: Normal appearance. She is well-developed and well-groomed. She is ill-appearing.  Cardiovascular:     Rate and Rhythm: Normal rate.  Pulmonary:     Effort: No tachypnea, accessory muscle usage or respiratory distress.  Abdominal:     Palpations: Abdomen is soft.  Neurological:     Mental Status: She is alert and  oriented to person, place, and time.     Vital Signs: BP 127/61 (BP Location: Left Arm)   Pulse 95   Temp 98 F (36.7 C) (Oral)   Resp 20   Ht 5' 5 (1.651 m)   Wt 74.2 kg   SpO2 97%   BMI 27.22 kg/m  Pain Scale: Not given for pain POSS *See Group Information*: 1-Acceptable,Awake and alert Pain Score: 3    SpO2: SpO2: 97 % O2 Device:SpO2: 97 % O2 Flow Rate: .   IO: Intake/output summary:  Intake/Output Summary (Last 24 hours) at 12/19/2024 0912 Last data filed at 12/19/2024 0351 Gross per 24 hour  Intake 782.78 ml  Output 800 ml  Net -17.22 ml    LBM: Last BM Date : 12/18/24 Baseline Weight: Weight: 76.3 kg Most recent weight: Weight: 74.2 kg      Time Total: 75 min  Greater than 50%  of this time was spent counseling and coordinating care related to the above assessment and plan.  Signed by: Bernarda Kitty, NP Palliative Medicine Team Pager # [phone] (M-F 8a-5p) Team Phone # [phone] (Nights/Weekends)                     [  1]  Allergies Allergen Reactions   Bee Venom Anaphylaxis   Sumatriptan Shortness Of Breath    Migraine worsened   Amoxicillin-Pot Clavulanate Diarrhea and Nausea And Vomiting    GI Intolerance   Oxycodone Itching and Hives    Other reaction(s): Other (see comments) Funny feeling in head  off the edge    Buprenorphine Hcl     Other reaction(s): Itching   Clarithromycin Diarrhea    Abdominal pain   Duloxetine  Hcl Other (See Comments)    drowsiness   Hydrocodone Itching   Lactose Intolerance (Gi) Other (See Comments)    Upset stomach, gas/bloating   Liraglutide Other (See Comments)    Abdominal discomfort   Tramadol Hcl Itching   "

## 2024-12-19 NOTE — TOC Initial Note (Signed)
 Transition of Care [facility]) - Initial/Assessment Note    Patient Details  Name: Karen Warner MRN: [id number] Date of Birth: [birth year]  [facility] CM/SW Contact:    [name] Phone Number: [phone] 12/19/2024, 2:41 PM  Clinical Narrative:     HF CSW met with patient at bedside. Patient stated that she lives with her significant other and his daughter. Patient stated that she drives. Patient stated that she has no current history of HH services. Patient stated that she had services when she had a stroke. Patient stated that she does not use any equipment. Patient stated that she has a scale at home. Patient stated that she has a PCP. CSW explained that a hospital follow up appointment is typically scheduled closer towards dc. Patient is agreeable and requested afternoon appointments. Patient stated that family will provide transportation at dc.   Patient expressed her thoughts and feeling about the LVAD. Patient stated that she is looking forward to the procedure.   HF CSW/ CM will continue to follow and monitor for dc readiness.              Expected Discharge Plan: Home/Self Care Barriers to Discharge: Continued Medical Work up   Patient Goals and CMS Choice Patient states their goals for this hospitalization and ongoing recovery are:: get stronger CMS Medicare.gov Compare Post Acute Care list provided to:: Patient Choice offered to / list presented to : Patient [place] ownership interest in [facility].provided to:: Patient    Expected Discharge Plan and Services       Living arrangements for the past 2 months: Single Family Home                                      Prior Living Arrangements/Services Living arrangements for the past 2 months: Single Family Home Lives with:: Self Patient language and need for interpreter reviewed:: Yes Do you feel safe going back to the place where you live?: Yes      Need for  Family Participation in Patient Care: Yes (Comment) Care giver support system in place?: Yes (comment)   Criminal Activity/Legal Involvement Pertinent to Current Situation/Hospitalization: No - Comment as needed  Activities of Daily Living   ADL Screening (condition at time of admission) Independently performs ADLs?: Yes (appropriate for developmental age) Is the patient deaf or have difficulty hearing?: No Does the patient have difficulty seeing, even when wearing glasses/contacts?: No Does the patient have difficulty concentrating, remembering, or making decisions?: No  Permission Sought/Granted Permission sought to share information with : Family Supports, PCP                Emotional Assessment Appearance:: Appears stated age Attitude/Demeanor/Rapport: Engaged Affect (typically observed): Appropriate Orientation: : Oriented to Self, Oriented to Place, Oriented to  Time, Oriented to Situation Alcohol / Substance Use: Not Applicable Psych Involvement: No (comment)  Admission diagnosis:  Chest pain [R07.9] Chest pain, unspecified type [R07.9] Patient Active Problem List   Diagnosis Date Noted   Decompensated heart failure with reduced ejection fraction (HFrEF) (HCC) 12/16/2024   Acute on chronic systolic heart failure (HCC) 12/15/2024   Diabetes mellitus (HCC) 12/14/2024   Medication management 03/03/2023   CAD in native artery 06/23/2022   Biventricular ICD (implantable cardioverter-defibrillator) in place 04/03/2021   Obstructive sleep apnea 03/15/2021   COPD (chronic obstructive pulmonary disease) (HCC)  Colon polyps    Chronic headaches    CHF (congestive heart failure) (HCC)    Asthma    Arthritis    Anemia in chronic kidney disease 02/19/2021   IBS (irritable bowel syndrome)    Syncope and collapse 12/26/2020   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   Orthostatic hypotension 11/21/2020   Daytime sleepiness 11/20/2020   Hypertensive heart disease with heart  failure (HCC) 11/17/2020   Syncope 11/17/2020   Herpes simplex vulvovaginitis 11/02/2020   Snoring 10/17/2020   Depressed left ventricular ejection fraction 10/16/2020   Left bundle branch block 10/16/2020   Nasal congestion 09/30/2020   Coronary artery disease    Myocardial infarction [facility])    Unstable angina (HCC) 08/03/2020   Ischemic cardiomyopathy 08/03/2020   Non-ST elevation (NSTEMI) myocardial infarction (HCC) 08/03/2020   Obesity (BMI 30-39.9) 06/26/2020   Pustular rash 05/14/2020   Torticollis, acute 04/11/2020   Hordeolum externum of left upper eyelid 03/13/2020   Low vitamin B12 level 03/13/2020   Pain of right great toe 01/24/2020   Arthralgia 01/14/2020   Fibromyalgia affecting multiple sites 01/14/2020   Diarrhea of presumed infectious origin 01/07/2020   Ingrown nail 12/27/2019   History of anemia 12/12/2019   Low hemoglobin 12/12/2019   Low vitamin D  level 12/12/2019   Anemia 12/12/2019   Long-term use of aspirin  therapy 11/15/2019   Left renal mass 11/08/2019   Depression 11/05/2019   Neuropathy 11/05/2019   Other fatigue 09/12/2019   Rhinosinusitis 09/12/2019   Hypertensive emergency 08/06/2019   Left ventricular dysfunction 05/31/2019   LV dysfunction 05/31/2019   Trochanteric bursitis of right hip 04/20/2019   Aftercare following surgery 04/12/2019   Injury of toenail of left foot 04/06/2019   Moderate episode of recurrent major depressive disorder (HCC) 03/29/2019   Diabetic polyneuropathy associated with type 2 diabetes mellitus (HCC) 02/24/2019   History of disseminated herpes simplex 02/22/2019   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 02/22/2019   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) 02/22/2019   Seasonal allergies 02/22/2019   Preoperative cardiovascular examination 01/23/2019   Vitamin D  deficiency 01/23/2019   Chronic bilateral low back pain with bilateral sciatica 01/13/2019   DDD (degenerative disc disease), cervical 01/13/2019    Gastroesophageal reflux disease 01/13/2019   History of intestinal surgery 01/13/2019   Iron deficiency anemia 01/13/2019   Mixed hyperlipidemia 01/13/2019   Tobacco abuse 01/13/2019   Leukocytosis 05/28/2018   Diverticulitis 05/20/2018   Chronic obstructive pulmonary disease, unspecified (HCC) 03/18/2018   Diverticulitis of colon 03/18/2018   PVD (peripheral vascular disease) 04/08/2017   Chest pain 04/07/2017   Malignant essential hypertension 01/22/2016   Abdominal pain 12/22/2014   Chronic idiopathic constipation 12/22/2014   Chronic bladder pain 11/08/2014   Glaucoma 11/08/2014   H/O vaginal hysterectomy 11/08/2014   Hypertension 11/08/2014   Migraine, unspecified, not intractable, without status migrainosus 11/08/2014   Nocturia 11/08/2014   Other premature beats 11/08/2014   Urge incontinence 11/08/2014   Urinary urgency 11/08/2014   Vaginal atrophy 11/08/2014   Atherosclerotic heart disease of native coronary artery without angina pectoris 04/13/2014   Type 2 diabetes mellitus with stage 3b chronic kidney disease, with long-term current use of insulin  (HCC) 12/08/2013   Family history of ischemic heart disease (IHD) 08/16/2013   Ptosis 04/19/2012   PCP:  Silver Lamar LABOR, MD Pharmacy:   [facility] [phone] [place], [place] - [address] AT [facility] [address] [place] [address]  Phone: [phone] Fax: [phone]     Social Drivers of Health (SDOH) Social History: SDOH Screenings   Food Insecurity: No Food Insecurity (12/15/2024)  Housing: Low Risk (12/17/2024)  Transportation Needs: No Transportation Needs (12/17/2024)  Utilities: Not At Risk (12/17/2024)  Depression (PHQ2-9): Low Risk (05/26/2024)  Financial Resource Strain: Low Risk (12/07/2024)   Received from [facility]  Social Connections: Moderately Integrated (12/17/2024)  Tobacco Use: Medium Risk (12/14/2024)   SDOH Interventions:      Readmission Risk Interventions     No data to display

## 2024-12-20 ENCOUNTER — Ambulatory Visit: Attending: Student | Admitting: Student

## 2024-12-20 ENCOUNTER — Inpatient Hospital Stay (HOSPITAL_COMMUNITY)

## 2024-12-20 ENCOUNTER — Ambulatory Visit

## 2024-12-20 DIAGNOSIS — J449 Chronic obstructive pulmonary disease, unspecified: Secondary | ICD-10-CM | POA: Diagnosis not present

## 2024-12-20 DIAGNOSIS — M542 Cervicalgia: Secondary | ICD-10-CM | POA: Diagnosis not present

## 2024-12-20 DIAGNOSIS — I5023 Acute on chronic systolic (congestive) heart failure: Secondary | ICD-10-CM | POA: Diagnosis not present

## 2024-12-20 DIAGNOSIS — Z794 Long term (current) use of insulin: Secondary | ICD-10-CM | POA: Diagnosis not present

## 2024-12-20 DIAGNOSIS — I255 Ischemic cardiomyopathy: Secondary | ICD-10-CM | POA: Diagnosis not present

## 2024-12-20 DIAGNOSIS — R1032 Left lower quadrant pain: Secondary | ICD-10-CM | POA: Diagnosis not present

## 2024-12-20 DIAGNOSIS — E119 Type 2 diabetes mellitus without complications: Secondary | ICD-10-CM | POA: Diagnosis not present

## 2024-12-20 DIAGNOSIS — K219 Gastro-esophageal reflux disease without esophagitis: Secondary | ICD-10-CM | POA: Diagnosis not present

## 2024-12-20 DIAGNOSIS — I2510 Atherosclerotic heart disease of native coronary artery without angina pectoris: Secondary | ICD-10-CM | POA: Diagnosis not present

## 2024-12-20 DIAGNOSIS — Z7984 Long term (current) use of oral hypoglycemic drugs: Secondary | ICD-10-CM | POA: Diagnosis not present

## 2024-12-20 DIAGNOSIS — F32A Depression, unspecified: Secondary | ICD-10-CM | POA: Diagnosis not present

## 2024-12-20 LAB — COOXEMETRY PANEL
Carboxyhemoglobin: 2.1 % — ABNORMAL HIGH (ref 0.5–1.5)
Methemoglobin: 0.9 % (ref 0.0–1.5)
O2 Saturation: 72.5 %
Total hemoglobin: 11.3 g/dL — ABNORMAL LOW (ref 12.0–16.0)

## 2024-12-20 LAB — PULMONARY FUNCTION TEST
DL/VA % pred: 103 %
DL/VA: 4.24 ml/min/mmHg/L
DLCO cor % pred: 67 %
DLCO cor: 13.32 ml/min/mmHg
DLCO unc % pred: 60 %
DLCO unc: 12.07 ml/min/mmHg
FEF 25-75 Pre: 2.57 L/s
FEF2575-%Pred-Pre: 145 %
FEV1-%Pred-Pre: 74 %
FEV1-Pre: 1.66 L
FEV1FVC-%Pred-Pre: 116 %
FEV6-%Pred-Pre: 66 %
FEV6-Pre: 1.9 L
FEV6FVC-%Pred-Pre: 104 %
FVC-%Pred-Pre: 63 %
FVC-Pre: 1.9 L
Pre FEV1/FVC ratio: 87 %
Pre FEV6/FVC Ratio: 100 %
RV % pred: 72 %
RV: 1.69 L
TLC % pred: 71 %
TLC: 3.71 L

## 2024-12-20 LAB — GLUCOSE, CAPILLARY
Glucose-Capillary: 146 mg/dL — ABNORMAL HIGH (ref 70–99)
Glucose-Capillary: 137 mg/dL — ABNORMAL HIGH (ref 70–99)
Glucose-Capillary: 130 mg/dL — ABNORMAL HIGH (ref 70–99)
Glucose-Capillary: 168 mg/dL — ABNORMAL HIGH (ref 70–99)

## 2024-12-20 LAB — URINALYSIS, ROUTINE W REFLEX MICROSCOPIC
Bilirubin Urine: NEGATIVE
Glucose, UA: >=500 mg/dL — AB
Hgb urine dipstick: NEGATIVE
Ketones, ur: NEGATIVE mg/dL
Leukocytes,Ua: NEGATIVE
Nitrite: NEGATIVE
Protein, ur: NEGATIVE mg/dL
Specific Gravity, Urine: 1.01 (ref 1.005–1.030)
pH: 8 (ref 5.0–8.0)

## 2024-12-20 LAB — CBC
HCT: 32.5 % — ABNORMAL LOW (ref 36.0–46.0)
Hemoglobin: 10.7 g/dL — ABNORMAL LOW (ref 12.0–15.0)
MCH: 31.9 pg (ref 26.0–34.0)
MCHC: 32.9 g/dL (ref 30.0–36.0)
MCV: 97 fL (ref 80.0–100.0)
Platelets: 290 K/uL (ref 150–400)
RBC: 3.35 MIL/uL — ABNORMAL LOW (ref 3.87–5.11)
RDW: 13.7 % (ref 11.5–15.5)
WBC: 12.9 K/uL — ABNORMAL HIGH (ref 4.0–10.5)
nRBC: 0.2 % (ref 0.0–0.2)

## 2024-12-20 LAB — BASIC METABOLIC PANEL WITH GFR
Anion gap: 9 (ref 5–15)
BUN: 14 mg/dL (ref 8–23)
CO2: 22 mmol/L (ref 22–32)
Calcium: 9.3 mg/dL (ref 8.9–10.3)
Chloride: 104 mmol/L (ref 98–111)
Creatinine, Ser: 1.55 mg/dL — ABNORMAL HIGH (ref 0.44–1.00)
GFR, Estimated: 35 mL/min — ABNORMAL LOW
Glucose, Bld: 141 mg/dL — ABNORMAL HIGH (ref 70–99)
Potassium: 4.4 mmol/L (ref 3.5–5.1)
Sodium: 135 mmol/L (ref 135–145)

## 2024-12-20 LAB — FACTOR 5 LEIDEN

## 2024-12-20 LAB — VAS US DOPPLER PRE VAD

## 2024-12-20 LAB — MAGNESIUM: Magnesium: 2.5 mg/dL — ABNORMAL HIGH (ref 1.7–2.4)

## 2024-12-20 MED ORDER — EMPAGLIFLOZIN 10 MG PO TABS: 10.0000 mg | ORAL_TABLET | Freq: Every day | ORAL | Status: DC

## 2024-12-20 NOTE — Progress Notes (Addendum)
 "    Advanced Heart Failure Rounding Note  Cardiologist: Dub Huntsman, DO  [facility] Cardiologist: Dr. Rolan  Chief Complaint: A/c systolic HF w/ low output  Patient Profile   Karen Warner is a 74 y.o. female with with history of CAD and ischemic cardiomyopathy, EF <20%, CKD III and COPD admitted w/ a/c CHF, chest/abdominal pain. RHC c/w low output.   Significant events:   1/15: RHC (RA 2, PA 21/10, PCW 3,  PA sat 59%, TD CI 1.49, FICK CI 1.88, PAPi 5.5), started on milrinone            LHC patent stents, nonobstructive CAD   1/19: co-ox persistently in 40s, milrinone  increased 0.375  Subjective:    Co-ox persistently in 40s yesterday on milrinone  0.25. Coo-ox today 73% on 0.375 mcg/kg/min. CVP 1. sCr 1.33>1.5  Sitting up in bed. Feeling well this morning, did not sleep well at all overnight. No other complaints.   Objective:    Weight Range: 74 kg Body mass index is 27.16 kg/m.   Vital Signs:   Temp:  [97.6 F (36.4 C)-98.4 F (36.9 C)] 98.4 F (36.9 C) (01/20 0710) Pulse Rate:  [94-108] 98 (01/20 0710) Resp:  [18-20] 18 (01/20 0337) BP: (104-125)/(67-83) 124/67 (01/20 0710) SpO2:  [96 %-100 %] 99 % (01/20 0710) Weight:  [74 kg] 74 kg (01/20 0500) Last BM Date : 12/19/24  Weight change: Filed Weights   12/18/24 1624 12/19/24 0406 12/20/24 0500  Weight: 74.6 kg 74.2 kg 74 kg   Intake/Output:  Intake/Output Summary (Last 24 hours) at 12/20/2024 0834 Last data filed at 12/20/2024 0430 Gross per 24 hour  Intake 633 ml  Output --  Net 633 ml    Physical Exam   General: Well appearing. No distress  Cardiac: JVP flat. No murmurs  Extremities: Warm and dry.  No edema.  Neuro: A&O x3. Affect pleasant.   Telemetry   SR 90s (personally reviewed)  Labs   CBC Recent Labs    12/19/24 0349 12/20/24 0428  WBC 13.7* 12.9*  HGB 11.5* 10.7*  HCT 35.1* 32.5*  MCV 97.0 97.0  PLT 279 290   Basic Metabolic Panel Recent Labs    98/80/73 1832  12/20/24 0428  NA 140 135  K 3.8 4.4  CL 110 104  CO2 21* 22  GLUCOSE 103* 141*  BUN 13 14  CREATININE 1.34* 1.55*  CALCIUM  7.8* 9.3  MG  --  2.5*   Liver Function Tests Recent Labs    12/19/24 1832  AST 18  ALT 8  ALKPHOS 54  BILITOT 0.3  PROT 6.2*  ALBUMIN 3.3*   BNP (last 3 results) Recent Labs    09/01/24 1457  BNP 169.4*   ProBNP (last 3 results) Recent Labs    12/13/24 1529 12/14/24 0826  PROBNP 1,342.0* 1,539.0*   Hemoglobin A1C Recent Labs    12/19/24 0349  HGBA1C 6.6*   Medications:    Scheduled Medications:  alum & mag hydroxide-simeth  15 mL Oral BID   aspirin  EC  81 mg Oral Daily   Chlorhexidine  Gluconate Cloth  6 each Topical Daily   cyanocobalamin   1,000 mcg Oral Daily   diclofenac  Sodium  2 g Topical QID   dicyclomine   10 mg Oral TID AC   empagliflozin   10 mg Oral Daily   ezetimibe   10 mg Oral Daily   folic acid   1 mg Oral Daily   heparin   5,000 Units Subcutaneous Q8H   insulin  aspart  0-15 Units Subcutaneous TID WC   insulin  glargine  25 Units Subcutaneous BID   isosorbide  mononitrate  15 mg Oral Daily   lactose free nutrition  237 mL Oral TID WC   lidocaine   1 patch Transdermal Q24H   loratadine   10 mg Oral Daily   multivitamin with minerals  1 tablet Oral Daily   pantoprazole   40 mg Oral Daily   polyethylene glycol  17 g Oral BID   ranolazine   500 mg Oral BID   rosuvastatin   40 mg Oral QHS   senna  1 tablet Oral BID   sodium chloride  flush  10-40 mL Intracatheter Q12H   sodium chloride  flush  3 mL Intravenous Q12H   spironolactone   12.5 mg Oral Daily   valACYclovir   500 mg Oral Daily   Vitamin D  (Ergocalciferol )  50,000 Units Oral Q Sun   vortioxetine  HBr  5 mg Oral QHS    Infusions:  milrinone  0.375 mcg/kg/min (12/20/24 0608)    PRN Medications: acetaminophen , albuterol , bisacodyl , cyclobenzaprine , HYDROmorphone , nitroGLYCERIN , ondansetron , mouth rinse, sodium chloride  flush, sodium chloride  flush,  Ubrogepant   Assessment/Plan   1. Acute on chronic systolic CHF: Ischemic cardiomyopathy.  [employer] CRT-D device.  Echo in 8/24 showed EF < 20%, moderate LV dilation, mild LVH, RV normal.  This is somewhat worse than priors. LHC/RHC in 11/24 showed low CI at 2.12, normal filling pressures, unchanged coronary anatomy with no severe stenoses. S/p barostimulation activation therapy device 2/25. Echo from [facility] showed EF 20-25%, paradoxical LV motion. Echo 12/25 showed EF<20%, normal RV.  We have been concerned about possible low output HF, I wonder if this could be contributing to her GI symptoms.  RHC/LHC this admit showed stable coronary anatomy and low filling pressures but low output by Fick and thermodilution.  - Co-ox 73%, improved with increase in milrinone  to 0.375 mcg/kg/min. - CVP 1. Hold diuresis.  - Hold Jardiance  today. Restart tomorrow if volume improves. Hold prior to LVAD if decision is made to proceed - continue spiro 12.5 mg daily, will not increase with low CVP and rising Cr - Hold Coreg  with low output.  - Losartan  held w/ elevated creatinine, can resume as Cr improves - Digoxin  held with elevated level (1.2), can eventually restart at 0.0625 mg every other day.  - She needs evaluation for advanced therapies (LVAD). LVAD coordinators aware. Family meeting with VAD coordinators planned for today. Seen by Dr. Zhanae Proffit and felt to be good VAD candidate. (D/w Dr. Tedra Coppernoll personally) 2.  CAD: PCI to mid/distal LAD in 2020 and proximal LAD in 2021.  LHC in 11/24 showed stable coronary anatomy with no severe stenoses. She reported to the ER with 2 days of atypical chest pain.  TnT minimally elevated with no trend, this is probably due to demand ischemia.  Cath this admit showed stable coronary anatomy.  - Continue ranolazine  500 mg bid + imdur  - Continue ASA, hold Plavix  for now for LVAD consideration.  - Continue Crestor  + Zetia , goal LDL < 55. - No s/s angina 3. COPD: History  of smoking, quit 2021.   4. CKD 3: Follows with nephology at [facility]. Cr trending up. BP ok, co-ox ok. Hold jardiance  today. - continue milrinone  per above  - Hold losartan  w/ soft BP  5. Obesity: Has been on semaglutide. 6. PAD: Moderately decreased ABI on right in 7/25.  No claudication. Medical mgmt per Dr. Serene 7. GI: RUQ and epigastric pain.  No evidence by US  for acute cholecystitis.  GI symptoms could be a manifestation of CHF but symptoms not improving despite milrinone  support - seen by GI. No need for further w/u at this time  Length of Stay: 5  Jordan Lee, NP  12/20/2024, 8:34 AM  Advanced Heart Failure Team Pager [phone] (M-F; 7a - 5p)   Please visit [email or web address]: For overnight coverage please call cardiology fellow first. If fellow not available call Shock/ECMO MD on call.  For ECMO / Mechanical Support (Impella, IABP, LVAD) issues call Shock / ECMO MD on call.   Patient seen and examined with the above-signed Advanced Practice Provider and/or Housestaff. I personally reviewed laboratory data, imaging studies and relevant notes. I independently examined the patient and formulated the important aspects of the plan. I have edited the note to reflect any of my changes or salient points. I have personally discussed the plan with the patient and/or family.  Co-ox improved on higher dose of milrinone . CVP remains low. Denies CP or SOB. Didn't sleep well   General:  Sitting up in bed. No resp difficulty HEENT: normal Neck: supple. no JVD.  Cor: Regular rate & rhythm. No rubs, gallops or murmurs. Lungs: clear Abdomen: soft, nontender, nondistended.Good bowel sounds. Extremities: no cyanosis, clubbing, rash, edema Neuro: alert & orientedx3, cranial nerves grossly intact. moves all 4 extremities w/o difficulty. Affect pleasant   Plan for potential VAD this admit. Will discuss at [facility] in am.   Continue milrinone . Hold diuretics.   Toribio Fuel, MD  10:03 AM   "

## 2024-12-20 NOTE — Progress Notes (Signed)
 Palliative:  HPI: 74 y.o. 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 Required intubation 8/12.   I met today with 3 daughters, sister, brother, and Karen Lecher NP PCCM. We had discussion regarding path forward for one way extubation. We reviewed poor prognosis and expectation that Karen Warner will not do well once extubated. Family reiterate goal that he not suffer. We reviewed option to have medication available if he is having distress vs having medication already onboard to prevent distress. Family would like to have medication onboard to minimize any suffering during transition off ventilator. They would like to move forward with extubation later today and will have the rest of the family come to visit prior to extubation.   I reviewed plans and symptom management recommendations with RN.   Update: I was present with family prior to additional support as well as throughout extubation. Family appropriately tearful. Assisted to ensure comfort. I left family to visit privately with Karen Warner after he is resting comfortably after extubation.   All questions/concerns addressed. Emotional support provided. Much time coordinating care with RN, PCCM, and family.   Exam: Sedated on vent. FiO2 50%. Breathing regular, unlabored on vent. No distress. Not following commands. Abd soft. Warm to touch.   Plan:  - DNR - Extubated to full comfort care - Anticipate hospital death  100 min  Karen Kitty, NP Palliative Medicine Team Pager [phone] (Please see [email or web address] for schedule) Team Phone [phone]

## 2024-12-20 NOTE — Plan of Care (Signed)
 Patient ID: [name], female   DOB: 1951/06/10, 74 y.o.   MRN: [id number]  Problem: Metabolic: Goal: Ability to maintain appropriate glucose levels will improve Outcome: Progressing   Problem: Nutritional: Goal: Maintenance of adequate nutrition will improve Outcome: Progressing   Problem: Tissue Perfusion: Goal: Adequacy of tissue perfusion will improve Outcome: Progressing   Problem: Cardiovascular: Goal: Ability to achieve and maintain adequate cardiovascular perfusion will improve Outcome: Progressing   [name], RN

## 2024-12-20 NOTE — Care Management Important Message (Signed)
 Important Message  Patient Details  Name: Karen Warner MRN: [id number] Date of Birth: [birth year]   Important Message Given:  Yes - Medicare IM     Vonzell Arrie Sharps 12/20/2024, 8:08 AM

## 2024-12-20 NOTE — Plan of Care (Signed)
" °  Problem: Education: Goal: Ability to describe self-care measures that may prevent or decrease complications (Diabetes Survival Skills Education) will improve Outcome: Progressing   Problem: Fluid Volume: Goal: Ability to maintain a balanced intake and output will improve Outcome: Progressing   Problem: Metabolic: Goal: Ability to maintain appropriate glucose levels will improve Outcome: Progressing   Problem: Education: Goal: Understanding of CV disease, CV risk reduction, and recovery process will improve Outcome: Progressing   Problem: Cardiovascular: Goal: Ability to achieve and maintain adequate cardiovascular perfusion will improve Outcome: Progressing   Problem: Safety: Goal: Ability to remain free from injury will improve Outcome: Progressing   "

## 2024-12-20 NOTE — Progress Notes (Signed)
 "  HD#5 SUBJECTIVE:  Patient Summary: Karen Warner is a 74 y.o. with a pertinent PMH of CAD, pacemaker placement, Boroughs stimulator, ischemic cardiomyopathy, COPD, fibromyalgia, CKD who presented with CP and admitted for cardiac cath. Cardiac cath revealed low cardiac output with low filing pressures and stable coronary anatomy. Advanced Heart Failure onboard and LVAD coordinators consulted.   L and R Heart Cath 12/15/2024: low cardiac output with low filling pressures. Stable coronary anatomy. Low output HF. Advanced HF team consulting. LVAD coordinators consulted and will speak again with patient on 1/19. PICC line in place for CVP and co-ox. Patient not a heart transplant candidate.   Overnight Events: met with LVAD and palliative -- seems to be good candidate, coox 72.5 o2  Interim History: Patient was evaluated bedside.  States that she is not having any chest pain, shortness of breath, abdominal pain. She states that her family was here yesterday for the conversation with palliative care and LVAD team.  Her daughter will be coming to move down [place]  from [place]  to help take care of her.  She has no questions at this time but stated that she will write them down for tomorrow morning. Stated she did not sleep well last night with nightmare.    OBJECTIVE:  Vital Signs: Vitals:   12/19/24 2059 12/19/24 2332 12/20/24 0337 12/20/24 0500  BP: 110/83 125/83 123/76   Pulse: (!) 107 (!) 103 100   Resp: 18 20 18    Temp: 97.6 F (36.4 C) 97.6 F (36.4 C) 98 F (36.7 C)   TempSrc: Oral Oral Oral   SpO2: 100% 100% 98%   Weight:    74 kg  Height:       Supplemental O2: Room Air SpO2: 98 %  Filed Weights   12/18/24 1624 12/19/24 0406 12/20/24 0500  Weight: 74.6 kg 74.2 kg 74 kg     Intake/Output Summary (Last 24 hours) at 12/20/2024 0702 Last data filed at 12/20/2024 0430 Gross per 24 hour  Intake 633 ml  Output --  Net 633 ml   Net IO Since Admission:  2,037.92 mL [12/20/24 0702]  Physical Exam: Physical Exam Constitutional:      General: She is not in acute distress.    Appearance: She is not ill-appearing, toxic-appearing or diaphoretic.  Cardiovascular:     Heart sounds: Normal heart sounds. No murmur heard.    No friction rub. No gallop.  Pulmonary:     Effort: Pulmonary effort is normal. No respiratory distress.     Breath sounds: Normal breath sounds. No decreased breath sounds, wheezing, rhonchi or rales.  Abdominal:     General: Bowel sounds are normal.     Palpations: Abdomen is soft.     Tenderness: There is abdominal tenderness (LLQ under bruising/injection site) in the left lower quadrant. There is no guarding.     Comments: Knot felt under bruising/injection site on LLQ  Musculoskeletal:     Right lower leg: No edema.     Left lower leg: No edema.  Skin:    General: Skin is warm and dry.  Neurological:     Mental Status: She is alert.     Patient Lines/Drains/Airways Status     Active Line/Drains/Airways     Name Placement date Placement time Site Days   Peripheral IV 12/14/24 20 G 1 Left Antecubital 12/14/24  0800  Antecubital  6   PICC Double Lumen 12/15/24 Right Basilic 37 cm 0 cm 12/15/24  8468  --  5            Pertinent labs and imaging:      Latest Ref Rng & Units 12/20/2024    4:28 AM 12/19/2024    3:49 AM 12/16/2024    3:34 AM  CBC  WBC 4.0 - 10.5 K/uL 12.9  13.7  9.9   Hemoglobin 12.0 - 15.0 g/dL 89.2  88.4  87.1   Hematocrit 36.0 - 46.0 % 32.5  35.1  38.2   Platelets 150 - 400 K/uL 290  279  266        Latest Ref Rng & Units 12/20/2024    4:28 AM 12/19/2024    6:32 PM 12/19/2024    3:49 AM  CMP  Glucose 70 - 99 mg/dL 858  896  800   BUN 8 - 23 mg/dL 14  13  13    Creatinine 0.44 - 1.00 mg/dL 8.44  8.65  8.45   Sodium 135 - 145 mmol/L 135  140  134   Potassium 3.5 - 5.1 mmol/L 4.4  3.8  4.5   Chloride 98 - 111 mmol/L 104  110  100   CO2 22 - 32 mmol/L 22  21  24    Calcium  8.9 -  10.3 mg/dL 9.3  7.8  9.3   Total Protein 6.5 - 8.1 g/dL  6.2    Total Bilirubin 0.0 - 1.2 mg/dL  0.3    Alkaline Phos 38 - 126 U/L  54    AST 15 - 41 U/L  18    ALT 0 - 44 U/L  8       VAS US  DOPPLER PRE VAD Result Date: 12/19/2024 PERIOPERATIVE VASCULAR EVALUATION Patient Name:  Levita ANN EVANS WALTON  Date of Exam:   12/19/2024 Medical Rec #: [id number]                  Accession #:    [id number] Date of Birth: [birth year]                  Patient Gender: F Patient Age:   39 years Exam Location:  [facility] Procedure:      VAS US  DOPPLER PRE VAD Referring Phys: EZRA SHUCK --------------------------------------------------------------------------------  Indications:      Preoperative evaluation to rule out surgical contraindication. Comparison Study: No prior exam. Performing Technologist: Edilia Elden Appl  Examination Guidelines: A complete evaluation includes B-mode imaging, spectral Doppler, color Doppler, and power Doppler as needed of all accessible portions of each vessel. Bilateral testing is considered an integral part of a complete examination. Limited examinations for reoccurring indications may be performed as noted.  Right Carotid Findings: +----------+--------+--------+--------+-------------------------+--------+           PSV cm/sEDV cm/sStenosisDescribe                 Comments +----------+--------+--------+--------+-------------------------+--------+ CCA Prox  89      18                                                +----------+--------+--------+--------+-------------------------+--------+ CCA Distal87      22                                                +----------+--------+--------+--------+-------------------------+--------+  ICA Prox  72      19              heterogenous and calcific         +----------+--------+--------+--------+-------------------------+--------+ ICA Mid   58      25                                                 +----------+--------+--------+--------+-------------------------+--------+ ICA Distal54      21                                                +----------+--------+--------+--------+-------------------------+--------+ ECA       42      8                                                 +----------+--------+--------+--------+-------------------------+--------+ +----------+--------+-------+----------------+------------+           PSV cm/sEDV cmsDescribe        Arm Pressure +----------+--------+-------+----------------+------------+ Subclavian107     15     Multiphasic, WNL             +----------+--------+-------+----------------+------------+ +---------+--------+--+--------+--+---------+ VertebralPSV cm/s38EDV cm/s14Antegrade +---------+--------+--+--------+--+---------+ Left Carotid Findings: +----------+--------+--------+--------+-------------------------+--------+           PSV cm/sEDV cm/sStenosisDescribe                 Comments +----------+--------+--------+--------+-------------------------+--------+ CCA Prox  86      18                                                +----------+--------+--------+--------+-------------------------+--------+ CCA Distal63      16                                                +----------+--------+--------+--------+-------------------------+--------+ ICA Prox  41      17              heterogenous and calcific         +----------+--------+--------+--------+-------------------------+--------+ ICA Mid   42      16                                                +----------+--------+--------+--------+-------------------------+--------+ ICA Distal42      17                                                +----------+--------+--------+--------+-------------------------+--------+ ECA       66      9                                                  +----------+--------+--------+--------+-------------------------+--------+ +----------+--------+--------+----------------+------------+  SubclavianPSV cm/sEDV cm/sDescribe        Arm Pressure +----------+--------+--------+----------------+------------+           94              Multiphasic, WNL125          +----------+--------+--------+----------------+------------+ +---------+--------+---+--------+--+---------+ VertebralPSV cm/s103EDV cm/s28Antegrade +---------+--------+---+--------+--+---------+  ABI Findings: +---------+------------------+-----+--------+--------+ Right    Rt Pressure (mmHg)IndexWaveformComment  +---------+------------------+-----+--------+--------+ Brachial                                IV line  +---------+------------------+-----+--------+--------+ PTA      85                0.68 biphasic         +---------+------------------+-----+--------+--------+ DP       71                0.57 biphasic         +---------+------------------+-----+--------+--------+ Burnetta Lapidus                     Abnormal         +---------+------------------+-----+--------+--------+ +---------+------------------+-----+---------+-------+ Left     Lt Pressure (mmHg)IndexWaveform Comment +---------+------------------+-----+---------+-------+ Brachial 125                    triphasic        +---------+------------------+-----+---------+-------+ PTA      115               0.92 triphasic        +---------+------------------+-----+---------+-------+ DP       108               0.86 biphasic         +---------+------------------+-----+---------+-------+ Great Toe101                    Normal           +---------+------------------+-----+---------+-------+ +-------+---------------+----------------+ ABI/TBIToday's ABI/TBIPrevious ABI/TBI +-------+---------------+----------------+ Right  0.68/ 0.37                       +-------+---------------+----------------+ Left   0.92/ 0.81                      +-------+---------------+----------------+  Summary: Right Carotid: Velocities in the right ICA are consistent with a 1-39% stenosis. Left Carotid: Velocities in the left ICA are consistent with a 1-39% stenosis. Vertebrals:  Bilateral vertebral arteries demonstrate antegrade flow. Subclavians: Normal flow hemodynamics were seen in bilateral subclavian              arteries.  *See table(s) above for measurements and observations. Right ABI: Resting right ankle-brachial index indicates moderate right lower extremity arterial disease. The right toe-brachial index is abnormal. Left ABI: Resting left ankle-brachial index indicates mild left lower extremity arterial disease. The left toe-brachial index is normal.     Preliminary      ASSESSMENT/PLAN:  Assessment: Principal Problem:   Acute on chronic systolic heart failure (HCC) Active Problems:   Abdominal pain   Diabetes mellitus (HCC)   Plan: #Acute on Chronic HF --> Low output HF CAD Ischemic cardiomyopathy with [employer] CRT-D device and Barostim device  Chest Pain  PAD Co-Ox today 73%. Continuing current milrinone  infusion. Patient wanting LVAD and has good familial support. No current complaints of CP or SOB. CVP 1, holding diuresis. Right and left ABI with moderate LE arterial  disease, toe brachial index normal; vertebral and subclavian with normal flow.  - f/u LVAD consult (RV function good and no significant valvular disease) - appreciate cardiology recommendations:   - milrinone  infusion 0.375 mcg/kg/min  - HOLDING Jardiance  10 mg daily today  - spironolactone  12.5 mg daily    - ASA 81 mg  - Ezetimibe  10 mg and Crestor  40 mg daily, goal <55   - Imdur  15 mg daily   - holding Plavix  with LVAD consideration   - holding coreg  given low output state   - holding losartan   - holding digoxin  given elevated level; may restart at 0.0625 mg every  other day once renal function stabilizes.   - strict I/O and daily weights - Recommendations for CP  - continue Ranolazine  500 mg BID  - continue nitroglycerin  PRN  #Abdominal Pain  No acute concerns.   - continue protonix  40 mg daily  - continue bentyl  10 mg TID - continue maalox/mylanta bid - miralax  BID - senna BID  #AKI on CKD, resolved Baseline Cr around 1.5. Current: 1.55. on admit, 1.93. Likely cardiorenal in setting of low output HF.  - monitor daily BMP - avoid nephrotoxic medications  - renally dose medications   Chronic pain/fibromyalgia  - continue Tylenol  650 mg QID PRN - continue Flexeril  5 mg TID PRN - continue lidocaine  patch - continue voltaren  gel  - dilaudid  1 mg q 4 hours PRN - ubrogepant  50 mg daily PRN for HA  Type 2 DM 12/19/2024 A1C: 6.6 - continue lantus  25 U BID - continue SSI - continue jardiance  10 mg daily   Depression - continue trintellix  5 mg nightly   COPD -albuterol  PRN   Other home medications ordered - vitamin B12, folic acid , vit D ordered - albuterol  neb q 4 PRN  - claritin  10 mg daily  - valcyclovir 500 mg daily ordered   Best Practice: Diet: cardiac diet VTE: heparin  injection 5,000 Units Start: 12/15/24 2200 SCD's Start: 12/15/24 2122 Code: Full  Disposition planning: Therapy Recs: None, DME: none DISPO: Anticipated discharge pending clinical improvement.   Signature:  Sallyanne Benuel [facility]  7:02 AM, 12/20/2024  On Call pager [phone]  "

## 2024-12-20 NOTE — Progress Notes (Signed)
 Mobility Specialist Progress Note:    12/20/24 1248  Mobility  Activity Ambulated with assistance  Level of Assistance Standby assist, set-up cues, supervision of patient - no hands on  Assistive Device None  Distance Ambulated (ft) 100 ft  Range of Motion/Exercises Active  Activity Response Tolerated well  Mobility Referral Yes  Mobility visit 1 Mobility  Mobility Specialist Start Time (ACUTE ONLY) 1248  Mobility Specialist Stop Time (ACUTE ONLY) 1255  Mobility Specialist Time Calculation (min) (ACUTE ONLY) 7 min   Received pt sitting EOB agreeable to session. No c/o any symptoms. Pt moving and ambulating well w/ no assist. Returned pt to EOB w/ all needs met.   Venetia Keel Mobility Specialist Please Neurosurgeon or Rehab Office at [phone]

## 2024-12-21 DIAGNOSIS — E119 Type 2 diabetes mellitus without complications: Secondary | ICD-10-CM | POA: Diagnosis not present

## 2024-12-21 DIAGNOSIS — F32A Depression, unspecified: Secondary | ICD-10-CM | POA: Diagnosis not present

## 2024-12-21 DIAGNOSIS — K219 Gastro-esophageal reflux disease without esophagitis: Secondary | ICD-10-CM | POA: Diagnosis not present

## 2024-12-21 DIAGNOSIS — Z7984 Long term (current) use of oral hypoglycemic drugs: Secondary | ICD-10-CM | POA: Diagnosis not present

## 2024-12-21 DIAGNOSIS — I2510 Atherosclerotic heart disease of native coronary artery without angina pectoris: Secondary | ICD-10-CM | POA: Diagnosis not present

## 2024-12-21 DIAGNOSIS — Z794 Long term (current) use of insulin: Secondary | ICD-10-CM | POA: Diagnosis not present

## 2024-12-21 DIAGNOSIS — M542 Cervicalgia: Secondary | ICD-10-CM | POA: Diagnosis not present

## 2024-12-21 DIAGNOSIS — J449 Chronic obstructive pulmonary disease, unspecified: Secondary | ICD-10-CM | POA: Diagnosis not present

## 2024-12-21 DIAGNOSIS — I5023 Acute on chronic systolic (congestive) heart failure: Secondary | ICD-10-CM | POA: Diagnosis not present

## 2024-12-21 DIAGNOSIS — I255 Ischemic cardiomyopathy: Secondary | ICD-10-CM | POA: Diagnosis not present

## 2024-12-21 DIAGNOSIS — R1032 Left lower quadrant pain: Secondary | ICD-10-CM | POA: Diagnosis not present

## 2024-12-21 LAB — COOXEMETRY PANEL
Carboxyhemoglobin: 1.6 % — ABNORMAL HIGH (ref 0.5–1.5)
Methemoglobin: <0.7 % (ref 0.0–1.5)
O2 Saturation: 67.5 %
Total hemoglobin: 11.4 g/dL — ABNORMAL LOW (ref 12.0–16.0)

## 2024-12-21 LAB — GLUCOSE, CAPILLARY
Glucose-Capillary: 130 mg/dL — ABNORMAL HIGH (ref 70–99)
Glucose-Capillary: 151 mg/dL — ABNORMAL HIGH (ref 70–99)
Glucose-Capillary: 159 mg/dL — ABNORMAL HIGH (ref 70–99)
Glucose-Capillary: 202 mg/dL — ABNORMAL HIGH (ref 70–99)

## 2024-12-21 LAB — BASIC METABOLIC PANEL WITH GFR
Anion gap: 10 (ref 5–15)
BUN: 15 mg/dL (ref 8–23)
CO2: 23 mmol/L (ref 22–32)
Calcium: 9.3 mg/dL (ref 8.9–10.3)
Chloride: 103 mmol/L (ref 98–111)
Creatinine, Ser: 1.56 mg/dL — ABNORMAL HIGH (ref 0.44–1.00)
GFR, Estimated: 35 mL/min — ABNORMAL LOW
Glucose, Bld: 129 mg/dL — ABNORMAL HIGH (ref 70–99)
Potassium: 4.5 mmol/L (ref 3.5–5.1)
Sodium: 135 mmol/L (ref 135–145)

## 2024-12-21 MED ORDER — SALINE SPRAY 0.65 % NA SOLN
1.0000 | NASAL | Status: DC | PRN
  Administered 2024-12-21: 1 via NASAL
  Filled 2024-12-21: qty 44

## 2024-12-21 NOTE — Progress Notes (Signed)
 "  HD#6 SUBJECTIVE:  Patient Summary: Karen Warner is a 74 y.o. with a pertinent PMH of CAD, pacemaker placement, [place]s stimulator, ischemic cardiomyopathy, COPD, fibromyalgia, CKD who presented with CP and admitted for cardiac cath. Cardiac cath revealed low cardiac output with low filing pressures and stable coronary anatomy. Advanced Heart Failure onboard and LVAD coordinators consulted.   L and R Heart Cath 12/15/2024: low cardiac output with low filling pressures. Stable coronary anatomy. Low output HF. Advanced HF team consulting. LVAD coordinators consulted and will speak again with patient on 1/19. PICC line in place for CVP and co-ox. Patient not a heart transplant candidate.   Overnight Events: no overnight events   Interim History: Patient was evaluated bedside.  States that she did not sleep great because she had a headache. Otherwise, no complaints of CP, SOB, abdominal pain, appetite good.    OBJECTIVE:  Vital Signs: Vitals:   12/21/24 0314 12/21/24 0500 12/21/24 0715 12/21/24 1128  BP: 130/79  124/87 131/79  Pulse: 96   99  Resp: 18   17  Temp: 98 F (36.7 C)  98.1 F (36.7 C) 98.1 F (36.7 C)  TempSrc: Oral  Oral Oral  SpO2: 98%  100% 96%  Weight:  73.6 kg    Height:       Supplemental O2: Room Air SpO2: 96 %  Filed Weights   12/19/24 0406 12/20/24 0500 12/21/24 0500  Weight: 74.2 kg 74 kg 73.6 kg     Intake/Output Summary (Last 24 hours) at 12/21/2024 1313 Last data filed at 12/21/2024 0900 Gross per 24 hour  Intake 1304.25 ml  Output 1100 ml  Net 204.25 ml   Net IO Since Admission: 2,242.17 mL [12/21/24 1313]  Physical Exam: Physical Exam Constitutional:      General: She is not in acute distress.    Appearance: She is not ill-appearing, toxic-appearing or diaphoretic.  Cardiovascular:     Heart sounds: Normal heart sounds. No murmur heard.    No friction rub. No gallop.  Pulmonary:     Effort: Pulmonary effort is normal. No  respiratory distress.     Breath sounds: Normal breath sounds. No decreased breath sounds, wheezing, rhonchi or rales.  Abdominal:     General: Bowel sounds are normal.     Palpations: Abdomen is soft.     Tenderness: There is abdominal tenderness (under bruising/injection site). There is no guarding.  Musculoskeletal:     Right lower leg: No edema.     Left lower leg: No edema.  Skin:    General: Skin is warm and dry.  Neurological:     Mental Status: She is alert.     Patient Lines/Drains/Airways Status     Active Line/Drains/Airways     Name Placement date Placement time Site Days   Peripheral IV 12/14/24 20 G 1 Left Antecubital 12/14/24  0800  Antecubital  7   PICC Double Lumen 12/15/24 Right Basilic 37 cm 0 cm 12/15/24  8468  -- 6            Pertinent labs and imaging:      Latest Ref Rng & Units 12/20/2024    4:28 AM 12/19/2024    3:49 AM 12/16/2024    3:34 AM  CBC  WBC 4.0 - 10.5 K/uL 12.9  13.7  9.9   Hemoglobin 12.0 - 15.0 g/dL 89.2  88.4  87.1   Hematocrit 36.0 - 46.0 % 32.5  35.1  38.2   Platelets 150 -  400 K/uL 290  279  266        Latest Ref Rng & Units 12/21/2024    4:13 AM 12/20/2024    4:28 AM 12/19/2024    6:32 PM  CMP  Glucose 70 - 99 mg/dL 870  858  896   BUN 8 - 23 mg/dL 15  14  13    Creatinine 0.44 - 1.00 mg/dL 8.43  8.44  8.65   Sodium 135 - 145 mmol/L 135  135  140   Potassium 3.5 - 5.1 mmol/L 4.5  4.4  3.8   Chloride 98 - 111 mmol/L 103  104  110   CO2 22 - 32 mmol/L 23  22  21    Calcium  8.9 - 10.3 mg/dL 9.3  9.3  7.8   Total Protein 6.5 - 8.1 g/dL   6.2   Total Bilirubin 0.0 - 1.2 mg/dL   0.3   Alkaline Phos 38 - 126 U/L   54   AST 15 - 41 U/L   18   ALT 0 - 44 U/L   8      No results found.    ASSESSMENT/PLAN:  Assessment: Principal Problem:   Acute on chronic systolic heart failure (HCC) Active Problems:   Abdominal pain   Diabetes mellitus (HCC)   Plan: #Acute on Chronic HF --> Low output HF CAD Ischemic  cardiomyopathy with [employer] CRT-D device and Barostim device  Chest Pain  PAD Co-Ox today 67.5%. Continuing current milrinone  infusion. No current complaints of CP or SOB. CVP 4.  - f/u LVAD consult (RV function good and no significant valvular disease) - appreciate cardiology recommendations:   - milrinone  infusion 0.375 mcg/kg/min - spironolactone  12.5 mg daily    - ASA 81 mg  - Ezetimibe  10 mg and Crestor  40 mg daily, goal <55   - Imdur  15 mg daily   - holding Plavix  with LVAD consideration   - holding coreg  given low output state   - holding losartan              - holding spironolactone   - holding digoxin  given elevated level; may restart at 0.0625 mg every other day once renal function stabilizes.   - strict I/O and daily weights - Recommendations for CP  - continue Ranolazine  500 mg BID  - continue nitroglycerin  PRN  #Abdominal Pain  No acute concerns.   - continue protonix  40 mg daily  - continue bentyl  10 mg TID - continue maalox/mylanta bid - miralax  BID - senna BID  #AKI on CKD, resolved Baseline Cr around 1.5. Current: 1.55. on admit, 1.93. Likely cardiorenal in setting of low output HF.  - monitor daily BMP - avoid nephrotoxic medications  - renally dose medications   Chronic pain/fibromyalgia  - continue Tylenol  650 mg QID PRN - continue Flexeril  5 mg TID PRN - continue lidocaine  patch - continue voltaren  gel  - dilaudid  1 mg q 4 hours PRN - ubrogepant  50 mg daily PRN for HA  Type 2 DM 12/19/2024 A1C: 6.6 - continue lantus  25 U BID - continue SSI - continue jardiance  10 mg daily   Depression - continue trintellix  5 mg nightly   COPD -albuterol  PRN   Other home medications ordered - vitamin B12, folic acid , vit D ordered - albuterol  neb q 4 PRN  - claritin  10 mg daily  - valcyclovir 500 mg daily ordered   Best Practice: Diet: cardiac diet VTE: heparin  injection 5,000 Units Start: 12/15/24 2200 SCD's Start:  12/15/24 2122 Code:  Full  Disposition planning: Therapy Recs: None, DME: none DISPO: will transfer to cardiology service after LVAD  Signature:  Melika Reder [facility]  1:13 PM, 12/21/2024  On Call pager [phone]  "

## 2024-12-21 NOTE — Progress Notes (Signed)
 Palliative:  HPI: 74 y.o. female  with past medical history of HFrEF EF <20%, ICM, CAD, s/p [employer] CRT-D, s/p Barostim device implant, iron deficiency anemia, CKD stage 3b, COPD, fibromyalgia, diverticulosis, GERD admitted on 12/14/2024 with chest pain with acute on chronic heart failure exacerbation and low output heart failure. Palliative care consulted for LVAD evaluation.   I met today with Chistine along with daughter Jon and daughter-in-law Luke. Feleica is in good spirits. Plans for VAD implant Friday. She feels good about proceeding. Family agree. We reviewed Advance Directive and filled out as we discussed. She names Jon as her primary HCPOA. She would not want prolonging measures in the 3 scenarios of terminal illness, comatose state, or advance dementia. She would not desire artificial nutrition or hydration in these scenarios either. Family understand and are supportive of her wishes. She would be open to dialysis if needed. She shares her wishes for arrangements for her burial when she dies. Family ask appropriate questions and are supportive of her wishes. We spent time discussing expectations post-op as well. She has excellent support. I will work to get notary to complete document before surgery Friday. I will return Sunday and will check in but my colleagues will be available for support if needed. I reassured them they will be in good hands with the VAD team.   All questions/concerns addressed. Emotional support provided.   Exam: Alert, oriented. No distress. Good spirits. Breathing regular, unlabored. Abd soft. Moves all extremities.   Plan: - VAD planned for Friday  60 min  Bernarda Kitty, NP Palliative Medicine Team Pager [phone] (Please see [email or web address] for schedule) Team Phone [phone]

## 2024-12-21 NOTE — Progress Notes (Addendum)
 Physical Therapy Treatment Patient Details Name: Karen Warner MRN: [id number] DOB: 1951/04/01 Today's Date: 12/21/2024   History of Present Illness 74 yo F presented to hospital with anterior chest pain. She states this pain is similar to her previous CAD episodes. Underwent heart cath 12/15/2024 and now advanced heart failure and LVAD coordinators consulted. PHMx:CAD, COPD (quit smoking 2021). Ischemic CM with EF < 20%, ICD placed. CHF, chronic low back pain, CKD3, DM2, DDD-cervical, fibromyalgia, MI, peripheral neuropathy, PVD, retinopathy and glaucoma    PT Comments  Pt seated up EOB on arrival agreeable to session and demonstrating continued progress towards acute goals. Pt able to doff socks and donn shoes seated up EOB with set up assist. Pt performing transfers sit<>stand and gait without UE support at grossly supervision level with no overt LOB noted. Pt continues to be limited by fatigue with pt stating she just feels tired multiple times throughout session. Plan for VAD surgery Friday, will plan to re-evaluate pt PT needs and update PT POC accordingly post op. Pt continues to benefit from skilled PT services to progress toward functional mobility goals.     If plan is discharge home, recommend the following: A little help with bathing/dressing/bathroom;Assistance with cooking/housework;Assist for transportation;Help with stairs or ramp for entrance   Can travel by private vehicle        Equipment Recommendations  None recommended by PT    Recommendations for Other Services       Precautions / Restrictions Precautions Precautions: Fall Recall of Precautions/Restrictions: Intact Restrictions Weight Bearing Restrictions Per Provider Order: No     Mobility  Bed Mobility Overal bed mobility: Needs Assistance             General bed mobility comments: pt seated up on EOB on arrival    Transfers Overall transfer level: Needs assistance Equipment used:  None Transfers: Sit to/from Stand Sit to Stand: Supervision           General transfer comment: pt standing from EOB at lowest height, pushing up with bilateral UE from bed    Ambulation/Gait Ambulation/Gait assistance: Supervision Gait Distance (Feet): 275 Feet Assistive device: None Gait Pattern/deviations: Step-through pattern, Decreased stride length Gait velocity: reduced     General Gait Details: Pt demonstrates reciprocal gait pattern with equal WB through BLE, and reduced gait speed.  No signs of instability or LOB noted. Distance limited by pt stated fatigue   Stairs             Wheelchair Mobility     Tilt Bed    Modified Rankin (Stroke Patients Only)       Balance Overall balance assessment: Needs assistance Sitting-balance support: No upper extremity supported, Feet supported Sitting balance-Leahy Scale: Good Sitting balance - Comments: seated EOB   Standing balance support: No upper extremity supported, During functional activity Standing balance-Leahy Scale: Fair Standing balance comment: ambulating without UE support                            Communication Communication Communication: No apparent difficulties  Cognition Arousal: Alert Behavior During Therapy: WFL for tasks assessed/performed   PT - Cognitive impairments: No apparent impairments                         Following commands: Intact      Cueing Cueing Techniques: Verbal cues, Gestural cues  Exercises      General Comments  General comments (skin integrity, edema, etc.): VSS on RA, VAD coordinator in room on arrival, plan for LVAD 1/23      Pertinent Vitals/Pain Pain Assessment Pain Assessment: No/denies pain    Home Living                          Prior Function            PT Goals (current goals can now be found in the care plan section) Acute Rehab PT Goals Patient Stated Goal: to feel better PT Goal Formulation: With  patient Time For Goal Achievement: 12/31/24 Progress towards PT goals: Progressing toward goals    Frequency    Min 2X/week      PT Plan      Co-evaluation              AM-PAC PT 6 Clicks Mobility   Outcome Measure  Help needed turning from your back to your side while in a flat bed without using bedrails?: A Little Help needed moving from lying on your back to sitting on the side of a flat bed without using bedrails?: A Little Help needed moving to and from a bed to a chair (including a wheelchair)?: A Little Help needed standing up from a chair using your arms (e.g., wheelchair or bedside chair)?: A Little Help needed to walk in hospital room?: A Little Help needed climbing 3-5 steps with a railing? : Total 6 Click Score: 16    End of Session   Activity Tolerance: Patient tolerated treatment well Patient left: in bed;with call bell/phone within reach;with nursing/sitter in room Nurse Communication: Mobility status PT Visit Diagnosis: Other abnormalities of gait and mobility (R26.89)     Time: 8476-8451 PT Time Calculation (min) (ACUTE ONLY): 25 min  Charges:    $Therapeutic Activity: 23-37 mins PT General Charges $$ ACUTE PT VISIT: 1 Visit                     Therisa R. PTA Acute Rehabilitation Services Office: [phone]   Therisa CHRISTELLA Boor 12/21/2024, 4:06 PM

## 2024-12-21 NOTE — Plan of Care (Signed)
" °  Problem: Education: Goal: Ability to describe self-care measures that may prevent or decrease complications (Diabetes Survival Skills Education) will improve Outcome: Progressing   Problem: Metabolic: Goal: Ability to maintain appropriate glucose levels will improve Outcome: Progressing   Problem: Tissue Perfusion: Goal: Adequacy of tissue perfusion will improve Outcome: Progressing   Problem: Education: Goal: Understanding of CV disease, CV risk reduction, and recovery process will improve Outcome: Progressing   Problem: Cardiovascular: Goal: Ability to achieve and maintain adequate cardiovascular perfusion will improve Outcome: Progressing   Problem: Safety: Goal: Ability to remain free from injury will improve Outcome: Progressing   "

## 2024-12-21 NOTE — Progress Notes (Signed)
 OT Cancellation Note  Patient Details Name: Karen Warner MRN: [id number] DOB: [birth year]   Cancelled Treatment:    Reason Eval/Treat Not Completed: Other (comment). OT attempted 3 times to see pt today with migraine pain being issue this a.m. and family and CM in with pt this afternoon on subsequent attempts. OT will follow up tomorrow  [name] Loose 12/21/2024, 2:58 PM

## 2024-12-21 NOTE — Plan of Care (Signed)
" °  Problem: Education: Goal: Ability to describe self-care measures that may prevent or decrease complications (Diabetes Survival Skills Education) will improve Outcome: Progressing   Problem: Coping: Goal: Ability to adjust to condition or change in health will improve Outcome: Progressing   Problem: Fluid Volume: Goal: Ability to maintain a balanced intake and output will improve Outcome: Progressing   Problem: Health Behavior/Discharge Planning: Goal: Ability to manage health-related needs will improve Outcome: Progressing   Problem: Skin Integrity: Goal: Risk for impaired skin integrity will decrease Outcome: Progressing   Problem: Activity: Goal: Ability to return to baseline activity level will improve Outcome: Progressing   Problem: Education: Goal: Knowledge of General Education information will improve Description: Including pain rating scale, medication(s)/side effects and non-pharmacologic comfort measures Outcome: Progressing   "

## 2024-12-21 NOTE — Progress Notes (Addendum)
 "    Advanced Heart Failure Rounding Note  Cardiologist: Dub Huntsman, DO  [facility] Cardiologist: Dr. Rolan  Chief Complaint: A/c systolic HF w/ low output  Patient Profile   Karen Warner is a 74 y.o. female with with history of CAD and ischemic cardiomyopathy, EF <20%, CKD III and COPD admitted w/ a/c CHF, chest/abdominal pain. RHC c/w low output.   Significant events:   1/15: RHC (RA 2, PA 21/10, PCW 3,  PA sat 59%, TD CI 1.49, FICK CI 1.88, PAPi 5.5), started on milrinone            LHC patent stents, nonobstructive CAD   1/19: co-ox persistently in 40s, milrinone  increased 0.375  Subjective:    Co-ox 68% on milrinone  0.375 mcg/kg/min. CVP this morning 6-7. Weight is stable. Creatinine stable 1.5. She is sitting up in bed, biggest complaint is headache overnight the prohibited sleep. Discussed at [facility] this morning and will plan to move forward with VAD placement this Friday.   Objective:    Weight Range: 73.6 kg Body mass index is 27.01 kg/m.   Vital Signs:   Temp:  [97.4 F (36.3 C)-98.1 F (36.7 C)] 98.1 F (36.7 C) (01/21 0715) Pulse Rate:  [96-115] 96 (01/21 0314) Resp:  [17-18] 18 (01/21 0314) BP: (105-131)/(59-87) 124/87 (01/21 0715) SpO2:  [98 %-100 %] 100 % (01/21 0715) Weight:  [73.6 kg] 73.6 kg (01/21 0500) Last BM Date : 12/19/24  Weight change: Filed Weights   12/19/24 0406 12/20/24 0500 12/21/24 0500  Weight: 74.2 kg 74 kg 73.6 kg   Intake/Output:  Intake/Output Summary (Last 24 hours) at 12/21/2024 0901 Last data filed at 12/21/2024 0600 Gross per 24 hour  Intake 1184.25 ml  Output 1100 ml  Net 84.25 ml    Physical Exam   General: Well appearing. No distress  Cardiac: JVP flat. No murmurs  Extremities: Warm and dry.  No edema.  Neuro: A&O x3. Affect pleasant.   Telemetry   SR 90-100s (personally reviewed)  Labs   CBC Recent Labs    12/19/24 0349 12/20/24 0428  WBC 13.7* 12.9*  HGB 11.5* 10.7*  HCT 35.1* 32.5*  MCV 97.0 97.0   PLT 279 290   Basic Metabolic Panel Recent Labs    98/79/73 0428 12/21/24 0413  NA 135 135  K 4.4 4.5  CL 104 103  CO2 22 23  GLUCOSE 141* 129*  BUN 14 15  CREATININE 1.55* 1.56*  CALCIUM  9.3 9.3  MG 2.5*  --    Liver Function Tests Recent Labs    12/19/24 1832  AST 18  ALT 8  ALKPHOS 54  BILITOT 0.3  PROT 6.2*  ALBUMIN 3.3*   BNP (last 3 results) Recent Labs    09/01/24 1457  BNP 169.4*   ProBNP (last 3 results) Recent Labs    12/13/24 1529 12/14/24 0826  PROBNP 1,342.0* 1,539.0*   Hemoglobin A1C Recent Labs    12/19/24 0349  HGBA1C 6.6*   Medications:    Scheduled Medications:  alum & mag hydroxide-simeth  15 mL Oral BID   aspirin  EC  81 mg Oral Daily   Chlorhexidine  Gluconate Cloth  6 each Topical Daily   cyanocobalamin   1,000 mcg Oral Daily   diclofenac  Sodium  2 g Topical QID   dicyclomine   10 mg Oral TID AC   ezetimibe   10 mg Oral Daily   folic acid   1 mg Oral Daily   heparin   5,000 Units Subcutaneous Q8H  insulin  aspart  0-15 Units Subcutaneous TID WC   insulin  glargine  25 Units Subcutaneous BID   isosorbide  mononitrate  15 mg Oral Daily   lactose free nutrition  237 mL Oral TID WC   lidocaine   1 patch Transdermal Q24H   loratadine   10 mg Oral Daily   multivitamin with minerals  1 tablet Oral Daily   pantoprazole   40 mg Oral Daily   polyethylene glycol  17 g Oral BID   ranolazine   500 mg Oral BID   rosuvastatin   40 mg Oral QHS   senna  1 tablet Oral BID   sodium chloride  flush  10-40 mL Intracatheter Q12H   sodium chloride  flush  3 mL Intravenous Q12H   spironolactone   12.5 mg Oral Daily   valACYclovir   500 mg Oral Daily   Vitamin D  (Ergocalciferol )  50,000 Units Oral Q Sun   vortioxetine  HBr  5 mg Oral QHS    Infusions:  milrinone  0.375 mcg/kg/min (12/21/24 0615)    PRN Medications: acetaminophen , albuterol , bisacodyl , cyclobenzaprine , HYDROmorphone , nitroGLYCERIN , ondansetron , mouth rinse, sodium chloride , sodium  chloride flush, sodium chloride  flush, Ubrogepant   Assessment/Plan   1. Acute on chronic systolic CHF: Ischemic cardiomyopathy.  [employer] CRT-D device.  Echo in 8/24 showed EF < 20%, moderate LV dilation, mild LVH, RV normal.  This is somewhat worse than priors. LHC/RHC in 11/24 showed low CI at 2.12, normal filling pressures, unchanged coronary anatomy with no severe stenoses. S/p barostimulation activation therapy device 2/25. Echo from [facility] showed EF 20-25%, paradoxical LV motion. Echo 12/25 showed EF<20%, normal RV.  We have been concerned about possible low output HF, wonder if this could be contributing to her GI symptoms.  RHC/LHC this admit showed stable coronary anatomy and low filling pressures but low output by Fick and thermodilution.  - Co-ox 68% on milrinone  0.375 mcg/kg/min (increased for low output) - CVP 6-7. Hold on starting diuresis. - No jardiance  with tentative plans for OR.  - Stop spiro with elevated Cr, can resume after VAD placment - Losartan  held w/ elevated creatinine, can resume as Cr improves - Digoxin  held with elevated level (1.2), can eventually restart at 0.0625 mg every other day.  - She needs evaluation for advanced therapies (LVAD). LVAD coordinators aware. Family and patient are on board with LVAD.  Seen by Dr. Harshan Kearley and felt to be good VAD candidate. Discussed at [facility] today, will plan to tentatively undergo LVAD implantation on Friday.  2.  CAD: PCI to mid/distal LAD in 2020 and proximal LAD in 2021.  LHC in 11/24 showed stable coronary anatomy with no severe stenoses. She reported to the ER with 2 days of atypical chest pain.  TnT minimally elevated with no trend, this is probably due to demand ischemia.  Cath this admit showed stable coronary anatomy.  - Continue ranolazine  500 mg bid + imdur  - Continue ASA, hold Plavix  for now for LVAD consideration.  - Continue Crestor  + Zetia , goal LDL < 55. - No s/s angina 3. COPD: History of smoking,  quit 2021.   4. CKD 3: Follows with nephology at [facility]. Cr trending up. BP ok, co-ox ok. Off jardiance  for now.  - continue milrinone  per above  5. Obesity: Has been on semaglutide. 6. PAD: Moderately decreased ABI on right in 7/25.  No claudication. Medical mgmt per Dr. Serene 7. GI: RUQ and epigastric pain.  No evidence by US  for acute cholecystitis.  GI symptoms could be a manifestation of CHF but  symptoms not improving despite milrinone  support - seen by GI. No need for further w/u at this time  Length of Stay: 6  Jordan Lee, NP  12/21/2024, 9:01 AM  Advanced Heart Failure Team Pager [phone] (M-F; 7a - 5p)   Please visit [email or web address]: For overnight coverage please call cardiology fellow first. If fellow not available call Shock/ECMO MD on call.  For ECMO / Mechanical Support (Impella, IABP, LVAD) issues call Shock / ECMO MD on call.   Patient seen and examined with the above-signed Advanced Practice Provider and/or Housestaff. I personally reviewed laboratory data, imaging studies and relevant notes. I independently examined the patient and formulated the important aspects of the plan. I have edited the note to reflect any of my changes or salient points. I have personally discussed the plan with the patient and/or family.  Remains on milrinone . Co-ox and CVP ok.   Feels ok.   General:  Sitting up in bed. No resp difficulty HEENT: normal Neck: supple. no JVD.  Cor: Regular rate & rhythm. No rubs, gallops or murmurs. Lungs: clear Abdomen: soft, nontender, nondistended.Good bowel sounds. Extremities: no cyanosis, clubbing, rash, edema Neuro: alert & orientedx3, cranial nerves grossly intact. moves all 4 extremities w/o difficulty. Affect pleasant  Case discussed extensively at [facility] this am and she has been approved to proceed with VAD for end-stage HF.   Will tentatively plan for Friday implant with Dr. Lucas.   Toribio Fuel, MD  2:16 PM   "

## 2024-12-21 NOTE — Progress Notes (Signed)
 Nutrition Follow-up  DOCUMENTATION CODES:   Not applicable  INTERVENTION:   Boost Plus po TID, each supplement provides 360 kcal and 14 grams of protein   Continue to encourage adequate nutritional intake.  Discussed the importance of protein.   NUTRITION DIAGNOSIS:   Increased nutrient needs related to chronic illness as evidenced by estimated needs.  Remains Applicable  GOAL:   Patient will meet greater than or equal to 90% of their needs  Not progressing, Inadequate intake. Addressing via meals and nutrition supplements.   MONITOR:   PO intake, Supplement acceptance, Labs, I & O's  REASON FOR ASSESSMENT:   Consult LVAD Eval  ASSESSMENT:   74 y.o. female present to the ED with chest pain. PMH includes GERD, CHF, T2DM, HLD, CAD, COPD, HTN, Glaucoma, diverticulitis, s/p pacemaker, [place] stimulator, fibromyalgia, and CKD III. Pt admitted with chest pain, acute on chronic HF, and AKI on CKD.  Pt reported nutrition hx. Since admission pt has reported she is not liking the hospital food but has enjoyed the pot roast and has had some tomato soup. Observed that she had multiple Boost Plus shakes on her table that have not been consumed, when asked the pt says she doesn't have time to drink them.  Encouraged to take sips of Boost throughout the day. Pt takes Vitamin B12 and Vitamin D3 supplements at home.   Pt started Ozempic 01/2024 and lost her appetite and wt have decreased since. She used to weigh 91.8 kg and her current wt is 73.6 kg. Pts report is consistent with intentional wt loss from Ozempic. Of note pt with moderate lower extremity deficits and given reduced oral intake secondary to GLP1 injections and increased nutrition needs pt is at high risk for malnutrition. She has experienced some constipation however, she reports it has gotten better recently.   Admit weight: 76.3 kg Current weight: 73.6 kg   Average Meal Intake: 1/16-1/21: 64% intake x 5 recorded  meals  Nutritionally Relevant Medications: Scheduled Meds:  alum & mag hydroxide-simeth  15 mL Oral BID   cyanocobalamin   1,000 mcg Oral Daily   dicyclomine   10 mg Oral TID AC   ezetimibe   10 mg Oral Daily   folic acid   1 mg Oral Daily   insulin  aspart  0-15 Units Subcutaneous TID WC   insulin  glargine  25 Units Subcutaneous BID   lactose free nutrition  237 mL Oral TID WC   multivitamin with minerals  1 tablet Oral Daily   pantoprazole   40 mg Oral Daily   rosuvastatin   40 mg Oral QHS   senna  1 tablet Oral BID   Vitamin D  (Ergocalciferol )  50,000 Units Oral Q Sun   Continuous Infusions:  milrinone  0.375 mcg/kg/min (12/21/24 0615)   PRN Meds: bisacodyl , ondansetron    Labs Reviewed: Creatinine: 1.56 GFR: 35  CBG ranges from 130-168 mg/dL over the last 24 hours HgbA1c 6.6    NUTRITION - FOCUSED PHYSICAL EXAM:  Flowsheet Row Most Recent Value  Orbital Region No depletion  Upper Arm Region Moderate depletion  Thoracic and Lumbar Region No depletion  Buccal Region No depletion  Temple Region Mild depletion  Clavicle Bone Region No depletion  Clavicle and Acromion Bone Region No depletion  Scapular Bone Region No depletion  Dorsal Hand Moderate depletion  Patellar Region Moderate depletion  Anterior Thigh Region Moderate depletion  Posterior Calf Region Severe depletion  Edema (RD Assessment) Mild  [left arm mild non-pitting]  Hair Reviewed  Eyes Reviewed  Mouth  Reviewed  Skin Reviewed  Nails Reviewed  [painted]    Diet Order:   Diet Order             Diet 2 gram sodium Room service appropriate? Yes with Assist; Fluid consistency: Thin  Diet effective now                   EDUCATION NEEDS:   Education needs have been addressed  Skin:  Skin Assessment: Reviewed RN Assessment  Last BM:  1/19- type 3  Height:   Ht Readings from Last 1 Encounters:  12/14/24 5' 5 (1.651 m)    Weight:   Wt Readings from Last 1 Encounters:  12/21/24 73.6 kg     Ideal Body Weight:  56.8 kg  BMI:  Body mass index is 27.01 kg/m.  Estimated Nutritional Needs:   Kcal:  1800-2000  Protein:  80-100 grams  Fluid:  >/= 1.8 L    Con Friends, Dietetic Intern

## 2024-12-22 DIAGNOSIS — E119 Type 2 diabetes mellitus without complications: Secondary | ICD-10-CM | POA: Diagnosis not present

## 2024-12-22 DIAGNOSIS — J449 Chronic obstructive pulmonary disease, unspecified: Secondary | ICD-10-CM | POA: Diagnosis not present

## 2024-12-22 DIAGNOSIS — F32A Depression, unspecified: Secondary | ICD-10-CM | POA: Diagnosis not present

## 2024-12-22 DIAGNOSIS — I255 Ischemic cardiomyopathy: Secondary | ICD-10-CM | POA: Diagnosis not present

## 2024-12-22 DIAGNOSIS — I2510 Atherosclerotic heart disease of native coronary artery without angina pectoris: Secondary | ICD-10-CM | POA: Diagnosis not present

## 2024-12-22 DIAGNOSIS — K219 Gastro-esophageal reflux disease without esophagitis: Secondary | ICD-10-CM | POA: Diagnosis not present

## 2024-12-22 DIAGNOSIS — Z7984 Long term (current) use of oral hypoglycemic drugs: Secondary | ICD-10-CM | POA: Diagnosis not present

## 2024-12-22 DIAGNOSIS — Z794 Long term (current) use of insulin: Secondary | ICD-10-CM | POA: Diagnosis not present

## 2024-12-22 DIAGNOSIS — M542 Cervicalgia: Secondary | ICD-10-CM | POA: Diagnosis not present

## 2024-12-22 DIAGNOSIS — R1032 Left lower quadrant pain: Secondary | ICD-10-CM | POA: Diagnosis not present

## 2024-12-22 DIAGNOSIS — N183 Chronic kidney disease, stage 3 unspecified: Secondary | ICD-10-CM | POA: Diagnosis not present

## 2024-12-22 DIAGNOSIS — I5023 Acute on chronic systolic (congestive) heart failure: Secondary | ICD-10-CM

## 2024-12-22 LAB — BASIC METABOLIC PANEL WITH GFR
Anion gap: 10 (ref 5–15)
BUN: 16 mg/dL (ref 8–23)
CO2: 22 mmol/L (ref 22–32)
Calcium: 9.6 mg/dL (ref 8.9–10.3)
Chloride: 103 mmol/L (ref 98–111)
Creatinine, Ser: 1.56 mg/dL — ABNORMAL HIGH (ref 0.44–1.00)
GFR, Estimated: 35 mL/min — ABNORMAL LOW
Glucose, Bld: 171 mg/dL — ABNORMAL HIGH (ref 70–99)
Potassium: 4.7 mmol/L (ref 3.5–5.1)
Sodium: 134 mmol/L — ABNORMAL LOW (ref 135–145)

## 2024-12-22 LAB — BLOOD GAS, ARTERIAL
Acid-base deficit: 1.3 mmol/L (ref 0.0–2.0)
Bicarbonate: 22.7 mmol/L (ref 20.0–28.0)
O2 Saturation: 99.1 %
Patient temperature: 36.7
pCO2 arterial: 34 mmHg (ref 32–48)
pH, Arterial: 7.42 (ref 7.35–7.45)
pO2, Arterial: 89 mmHg (ref 83–108)

## 2024-12-22 LAB — URINALYSIS, ROUTINE W REFLEX MICROSCOPIC
Bilirubin Urine: NEGATIVE
Glucose, UA: >=500 mg/dL — AB
Hgb urine dipstick: NEGATIVE
Ketones, ur: NEGATIVE mg/dL
Leukocytes,Ua: NEGATIVE
Nitrite: NEGATIVE
Protein, ur: NEGATIVE mg/dL
Specific Gravity, Urine: 1.013 (ref 1.005–1.030)
pH: 6 (ref 5.0–8.0)

## 2024-12-22 LAB — COOXEMETRY PANEL
Carboxyhemoglobin: 2.1 % — ABNORMAL HIGH (ref 0.5–1.5)
Methemoglobin: <0.7 % (ref 0.0–1.5)
O2 Saturation: 71.2 %
Total hemoglobin: 11.3 g/dL — ABNORMAL LOW (ref 12.0–16.0)

## 2024-12-22 LAB — PROTIME-INR
INR: 1 (ref 0.8–1.2)
Prothrombin Time: 14.2 s (ref 11.4–15.2)

## 2024-12-22 LAB — SURGICAL PCR SCREEN
MRSA, PCR: NEGATIVE
Staphylococcus aureus: NEGATIVE

## 2024-12-22 LAB — GLUCOSE, CAPILLARY
Glucose-Capillary: 120 mg/dL — ABNORMAL HIGH (ref 70–99)
Glucose-Capillary: 244 mg/dL — ABNORMAL HIGH (ref 70–99)
Glucose-Capillary: 167 mg/dL — ABNORMAL HIGH (ref 70–99)
Glucose-Capillary: 156 mg/dL — ABNORMAL HIGH (ref 70–99)

## 2024-12-22 LAB — APTT: aPTT: 39 s — ABNORMAL HIGH (ref 24–36)

## 2024-12-22 MED ORDER — TEMAZEPAM 15 MG PO CAPS: 15.0000 mg | ORAL_CAPSULE | Freq: Once | ORAL | Status: DC | PRN

## 2024-12-22 MED ORDER — MAGNESIUM SULFATE 50 % IJ SOLN
40.0000 meq | INTRAMUSCULAR | Status: DC
  Filled 2024-12-22: qty 9.85

## 2024-12-22 MED ORDER — DOPAMINE-DEXTROSE 3.2-5 MG/ML-% IV SOLN
0.0000 ug/kg/min | INTRAVENOUS | Status: DC
  Filled 2024-12-22: qty 250

## 2024-12-22 MED ORDER — MILRINONE LACTATE IN DEXTROSE 20-5 MG/100ML-% IV SOLN
0.3000 ug/kg/min | INTRAVENOUS | Status: AC
  Administered 2024-12-23: .375 ug/kg/min via INTRAVENOUS
  Filled 2024-12-22 (×2): qty 100

## 2024-12-22 MED ORDER — CEFAZOLIN SODIUM-DEXTROSE 2-4 GM/100ML-% IV SOLN
2.0000 g | INTRAVENOUS | Status: AC
  Administered 2024-12-23: 2 g via INTRAVENOUS
  Filled 2024-12-22: qty 100

## 2024-12-22 MED ORDER — VASOPRESSIN 20 UNITS/100 ML INFUSION FOR SHOCK
0.0400 [IU]/min | INTRAVENOUS | Status: DC
  Filled 2024-12-22: qty 100

## 2024-12-22 MED ORDER — MUPIROCIN 2 % EX OINT
1.0000 | TOPICAL_OINTMENT | Freq: Two times a day (BID) | CUTANEOUS | Status: DC
  Administered 2024-12-22: 1 via NASAL
  Filled 2024-12-22: qty 22

## 2024-12-22 MED ORDER — EPINEPHRINE HCL 5 MG/250ML IV SOLN IN NS
0.0000 ug/min | INTRAVENOUS | Status: AC
  Administered 2024-12-23: 2 ug/min via INTRAVENOUS
  Filled 2024-12-22: qty 250

## 2024-12-22 MED ORDER — SODIUM CHLORIDE 0.9 % IV SOLN
600.0000 mg | INTRAVENOUS | Status: DC
  Filled 2024-12-22: qty 10

## 2024-12-22 MED ORDER — CHLORHEXIDINE GLUCONATE CLOTH 2 % EX PADS
6.0000 | MEDICATED_PAD | Freq: Once | CUTANEOUS | Status: AC
  Administered 2024-12-23: 6 via TOPICAL

## 2024-12-22 MED ORDER — CHLORHEXIDINE GLUCONATE 0.12 % MT SOLN
15.0000 mL | Freq: Once | OROMUCOSAL | Status: AC
  Administered 2024-12-23: 15 mL via OROMUCOSAL
  Filled 2024-12-22: qty 15

## 2024-12-22 MED ORDER — TRANEXAMIC ACID (OHS) BOLUS VIA INFUSION
15.0000 mg/kg | INTRAVENOUS | Status: AC
  Administered 2024-12-23: 1107 mg via INTRAVENOUS
  Filled 2024-12-22: qty 1107

## 2024-12-22 MED ORDER — VANCOMYCIN HCL 1 G IV SOLR
1000.0000 mg | INTRAVENOUS | Status: DC
  Filled 2024-12-22: qty 20

## 2024-12-22 MED ORDER — INSULIN REGULAR(HUMAN) IN NACL 100-0.9 UT/100ML-% IV SOLN
INTRAVENOUS | Status: AC
  Administered 2024-12-23: 1 [IU]/h via INTRAVENOUS
  Filled 2024-12-22: qty 100

## 2024-12-22 MED ORDER — TRANEXAMIC ACID 1000 MG/10ML IV SOLN
1.5000 mg/kg/h | INTRAVENOUS | Status: AC
  Administered 2024-12-23: 1.5 mg/kg/h via INTRAVENOUS
  Filled 2024-12-22: qty 25

## 2024-12-22 MED ORDER — HEPARIN 30,000 UNITS/1000 ML (OHS) CELLSAVER SOLUTION
Status: DC
  Filled 2024-12-22: qty 1000

## 2024-12-22 MED ORDER — FLUCONAZOLE IN SODIUM CHLORIDE 400-0.9 MG/200ML-% IV SOLN
400.0000 mg | INTRAVENOUS | Status: AC
  Administered 2024-12-23: 400 mg via INTRAVENOUS
  Filled 2024-12-22: qty 200

## 2024-12-22 MED ORDER — TRANEXAMIC ACID (OHS) PUMP PRIME SOLUTION
2.0000 mg/kg | INTRAVENOUS | Status: DC
  Filled 2024-12-22: qty 1.48

## 2024-12-22 MED ORDER — DIAZEPAM 5 MG PO TABS
5.0000 mg | ORAL_TABLET | Freq: Once | ORAL | Status: AC
  Administered 2024-12-23: 5 mg via ORAL
  Filled 2024-12-22: qty 1

## 2024-12-22 MED ORDER — VANCOMYCIN HCL 1250 MG/250ML IV SOLN
1250.0000 mg | INTRAVENOUS | Status: AC
  Administered 2024-12-23: 1250 mg via INTRAVENOUS
  Filled 2024-12-22: qty 250

## 2024-12-22 MED ORDER — PHENYLEPHRINE HCL-NACL 20-0.9 MG/250ML-% IV SOLN
0.0000 ug/min | INTRAVENOUS | Status: DC
  Filled 2024-12-22: qty 250

## 2024-12-22 MED ORDER — NITROGLYCERIN IN D5W 200-5 MCG/ML-% IV SOLN
0.0000 ug/min | INTRAVENOUS | Status: DC
  Filled 2024-12-22: qty 250

## 2024-12-22 MED ORDER — NOREPINEPHRINE 4 MG/250ML-% IV SOLN
0.0000 ug/min | INTRAVENOUS | Status: AC
  Administered 2024-12-23: 4 ug/min via INTRAVENOUS
  Filled 2024-12-22: qty 250

## 2024-12-22 MED ORDER — DOBUTAMINE-DEXTROSE 4-5 MG/ML-% IV SOLN
2.0000 ug/kg/min | INTRAVENOUS | Status: DC
  Filled 2024-12-22: qty 250

## 2024-12-22 MED ORDER — BISACODYL 5 MG PO TBEC
5.0000 mg | DELAYED_RELEASE_TABLET | Freq: Once | ORAL | Status: AC
  Administered 2024-12-22: 5 mg via ORAL
  Filled 2024-12-22: qty 1

## 2024-12-22 MED ORDER — DEXMEDETOMIDINE HCL IN NACL 400 MCG/100ML IV SOLN
0.1000 ug/kg/h | INTRAVENOUS | Status: AC
  Administered 2024-12-23: .3 ug/kg/h via INTRAVENOUS
  Filled 2024-12-22: qty 100

## 2024-12-22 MED ORDER — POTASSIUM CHLORIDE 2 MEQ/ML IV SOLN
80.0000 meq | INTRAVENOUS | Status: DC
  Filled 2024-12-22: qty 40

## 2024-12-22 MED ORDER — SODIUM CHLORIDE 0.9 % IV SOLN
600.0000 mg | INTRAVENOUS | Status: AC
  Administered 2024-12-23: 600 mg via INTRAVENOUS
  Filled 2024-12-22: qty 10

## 2024-12-22 NOTE — Progress Notes (Addendum)
 Patient has been discussed with the VAD Medical Review board on 12/21/24. The team feels as if the patient is a good candidate for Destination LVAD therapy. The patient meets criteria for a LVAD implant as listed below:  1) NYHA Class: IV documented on 12/22/24  2) Has a left ventricular ejection fraction (LVEF) < 25%   *EF <20% by echo (date) 11/18/24  3) Must meet one of the following:   Is inotrope dependent   *On inotropes: Milrinone  started 12/15/24  OR  Has a Cardiac Index (CI) < 2.2  L/min/m2 while not on inotropes:   *CI: 1.49   4) Must meet one of the following:   ___X___ Is on optimal medical management (OMM), based on current heart failure practice guidelines for at least 45 of the last 60 days and are failing to respond   ______ Has advanced heart failure for at least 14 days and are dependent on an intra-aortic balloon pump (IABP) or similar temporary mechanical circulatory support for at least 7 days      ____ IABP inserted (date) ____      ____ Impella inserted (date) ____  5)  Social work and palliative care evaluations demonstrate appropriate support system in place for discharge to home with a VAD and that end of life discussions have taken place. Both services have expressed no concern regarding patient's candidacy.         *Social work consult (date): 12/19/2024        *Palliative Care Consult (date): 12/19/2024  6)  Primary caretaker identified that can be taught along with the patient how to manage        the VAD equipment.        *Name: Karen Warner  7)  Deemed appropriate by our financial coordinator: Graig Miracle        Prior approval: Covered under hospitalization   8)  VAD Coordinator, Isaiah Knoll has met with patient and caregiver, shown them the VAD equipment and discussed with the patient and caregiver about lifestyle changes necessary for success on mechanical circulatory device.      *Met with patient Karen Warner on  12/19/24      *Consent for VAD Evaluation/Caregiver Agreement/HIPPA Release/Photo Release signed on 12/19/24   9)  Six Minute Walk: unable to obtain   10) KCCQ Pre VAD:  [place] Cardiomyopathy Questionnaire:   KCCQ-12    1 a. Ability to shower/bathe Moderately limited   1 b. Ability to walk 1 block Quite a bit limited   1 c. Ability to hurry/jog Limited for other reasons or did not do the activity   2. Edema feet/ankles/legs Less than once a week   3. Limited by fatigue At least once a day   4. Limited by dyspnea 3 or more times per week but not every day   5. Sitting up / on 3+ pillows Less than once a week   6. Limited enjoyment of life It has slightly limited my enjoyment of life   7. Rest of life w/ symptoms Somewhat satisfied   8 a. Participation in hobbies Did not limit at all   8 b. Participation in chores Does not apply or did not do for other reasons   8 c. Visiting family/friends Does not apply or did not do for other reasons       11)  Intermacs profile: 3  INTERMACS 1: Critical cardiogenic shock describes a patient who is crashing and burning, in which a  patient has life-threatening hypotension and rapidly escalating inotropic pressor support, with critical organ hypoperfusion often confirmed by worsening acidosis and lactate levels.  INTERMACS 2: Progressive decline describes a patient who has been demonstrated dependent on inotropic support but nonetheless shows signs of continuing deterioration in nutrition, renal function, fluid retention, or other major status indicator. Patient profile 2 can also describe a patient with refractory volume overload, perhaps with evidence of impaired perfusion, in whom inotropic infusions cannot be maintained due to tachyarrhythmias, clinical ischemia, or other intolerance.  INTERMACS 3: Stable but inotrope dependent describes a patient who is clinically stable on mild-moderate doses of intravenous inotropes (or has a  temporary circulatory support device) after repeated documentation of failure to wean without symptomatic hypotension, worsening symptoms, or progressive organ dysfunction (usually renal). It is critical to monitor nutrition, renal function, fluid balance, and overall status carefully in order to distinguish between a   patient who is truly stable at Patient Profile 3 and a patient who has unappreciated decline rendering them Patient Profile 2. This patient may be either at home or in the hospital.      INTERMACS 4: Resting symptoms describes a patient who is at home on oral therapy but frequently has symptoms of congestion at rest or with activities of daily living (ADL). He or she may have orthopnea, shortness of breath during ADL such as dressing or bathing, gastrointestinal symptoms (abdominal discomfort, nausea, poor appetite), disabling ascites or severe lower extremity edema. This patient should be carefully considered for more intensive management and surveillance programs, which may in some cases, reveal poor compliance that would compromise outcomes with any therapy.   .   INTERMACS 5: Exertion Intolerant describes a patient who is comfortable at rest but unable to engage in any activity, living predominantly within the house or housebound. This patient has no congestive symptoms, but may have chronically elevated volume status, frequently with renal dysfunction, and may be characterized as exercise intolerant.      INTERMACS 6: Exertion Limited also describes a patient who is comfortable at rest without evidence of fluid overload, but who is able to do some mild activity. Activities of daily living are comfortable and minor activities outside the home such as visiting friends or going to a restaurant can be performed, but fatigue results within a few minutes of any meaningful physical exertion. This patient has occasional episodes of worsening symptoms and is likely to have had a hospitalization  for heart failure within the past year.   INTERMACS 7: Advanced NYHA Class 3 describes a patient who is clinically stable with a reasonable level of comfortable activity, despite history of previous decompensation that is not recent. This patient is usually able to walk more than a block. Any decompensation requiring intravenous diuretics or hospitalization within the previous month should make this person a Patient Profile 6 or lower.     Schuyler Lunger RN, BSN VAD Coordinator 24/7 Pager [phone]

## 2024-12-22 NOTE — Plan of Care (Signed)
" °  Problem: Health Behavior/Discharge Planning: Goal: Ability to manage health-related needs will improve Outcome: Progressing   Problem: Tissue Perfusion: Goal: Adequacy of tissue perfusion will improve Outcome: Progressing   Problem: Cardiovascular: Goal: Ability to achieve and maintain adequate cardiovascular perfusion will improve Outcome: Progressing   Problem: Health Behavior/Discharge Planning: Goal: Ability to manage health-related needs will improve Outcome: Progressing   "

## 2024-12-22 NOTE — H&P (View-Only) (Signed)
 7 Days Post-Op Procedures (LRB): RIGHT/LEFT HEART CATH AND CORONARY ANGIOGRAPHY (N/A) Subjective: Feels ok  Milrinone  0.375 with Co-ox 71.2  Objective: Vital signs in last 24 hours: Temp:  [97.4 F (36.3 C)-98.1 F (36.7 C)] 97.6 F (36.4 C) (01/22 0402) Pulse Rate:  [98-116] 98 (01/22 0402) Cardiac Rhythm: Sinus tachycardia (01/21 1908) Resp:  [17-18] 18 (01/21 2308) BP: (105-132)/(52-87) 105/52 (01/22 0402) SpO2:  [96 %-100 %] 100 % (01/22 0402) Weight:  [73.8 kg] 73.8 kg (01/22 0156)  Hemodynamic parameters for last 24 hours: CVP:  [6 mmHg-9 mmHg] 6 mmHg  Intake/Output from previous day: 01/21 0701 - 01/22 0700 In: 1036.6 [P.O.:840; I.V.:196.6] Out: -  Intake/Output this shift: No intake/output data recorded.  General appearance: alert and cooperative Neurologic: intact Heart: regular rate and rhythm Lungs: clear to auscultation bilaterally Extremities: no edema  Lab Results: Recent Labs    12/20/24 0428  WBC 12.9*  HGB 10.7*  HCT 32.5*  PLT 290   BMET:  Recent Labs    12/21/24 0413 12/22/24 0456  NA 135 134*  K 4.5 4.7  CL 103 103  CO2 23 22  GLUCOSE 129* 171*  BUN 15 16  CREATININE 1.56* 1.56*  CALCIUM  9.3 9.6    PT/INR: No results for input(s): LABPROT, INR in the last 72 hours. ABG    Component Value Date/Time   HCO3 27.1 12/15/2024 1125   HCO3 26.9 12/15/2024 1125   TCO2 28 12/15/2024 1125   TCO2 28 12/15/2024 1125   ACIDBASEDEF 1.0 10/13/2023 1108   ACIDBASEDEF 1.0 10/13/2023 1108   O2SAT 71.2 12/22/2024 0456   CBG (last 3)  Recent Labs    12/21/24 1547 12/21/24 2108 12/22/24 0617  GLUCAP 159* 202* 120*    Assessment/Plan:  Acute on chronic systolic CHF with EF< 20% on milrinone  0.375.   Stage 3 CKD with creat stable at 1.56  Agree with need for Heartmate 3 LVAD as destination therapy.   I discussed the operative procedure with the patient including alternatives, benefits and risks; including but not limited to  bleeding, blood transfusion, infection, stroke, pump failure, organ dysfunction, and death.  Karen Warner understands and agrees to proceed.  We will schedule surgery for tomorrow morning.  LOS: 7 days    Karen Warner 12/22/2024

## 2024-12-22 NOTE — Progress Notes (Signed)
 Spoke with Joey, pacemaker rep from [employer]-- confirmed surgical time for 12/23/24--will be present.

## 2024-12-22 NOTE — TOC Progression Note (Signed)
 Transition of Care [facility]) - Progression Note    Patient Details  Name: Karen Warner MRN: [id number] Date of Birth: 15-Dec-1950  Transition of [facility]) CM/SW Contact  [name] Phone Number: [phone] 12/22/2024, 11:55 AM  Clinical Narrative:   Per chart review, patient is not medically ready for dc. ICM will continue to follow and monitor for dc readiness.   HF CSW/CM will continue to follow and monitor for dc readiness.     Expected Discharge Plan: Home/Self Care Barriers to Discharge: Continued Medical Work up               Expected Discharge Plan and Services       Living arrangements for the past 2 months: Single Family Home                                       Social Drivers of Health (SDOH) Interventions SDOH Screenings   Food Insecurity: No Food Insecurity (12/15/2024)  Housing: Low Risk (12/17/2024)  Transportation Needs: No Transportation Needs (12/17/2024)  Utilities: Not At Risk (12/17/2024)  Depression (PHQ2-9): Low Risk (05/26/2024)  Financial Resource Strain: Low Risk (12/07/2024)   Received from [facility]  Social Connections: Moderately Integrated (12/17/2024)  Tobacco Use: Medium Risk (12/14/2024)    Readmission Risk Interventions     No data to display

## 2024-12-22 NOTE — Progress Notes (Signed)
 "  HD#7 SUBJECTIVE:  Patient Summary: Karen Warner is a 74 y.o. with a pertinent PMH of CAD, pacemaker placement, [place] stimulator, ischemic cardiomyopathy, COPD, fibromyalgia, CKD who presented with CP and admitted for cardiac cath. Cardiac cath revealed low cardiac output with low filing pressures and stable coronary anatomy. Advanced Heart Failure onboard and LVAD coordinators consulted.   L and R Heart Cath 12/15/2024: low cardiac output with low filling pressures. Stable coronary anatomy. Low output HF. Advanced HF team consulting. LVAD coordinators consulted and will speak again with patient on 1/19. PICC line in place for CVP and co-ox. Patient not a heart transplant candidate. Plan for LVAD 12/23/2024  Overnight Events: no overnight events   Interim History: Patient was evaluated bedside. States she slept better last night and does not have a HA. No complaints of CP, SOB, abdominal pain, appetite good.    OBJECTIVE:  Vital Signs: Vitals:   12/21/24 2308 12/22/24 0156 12/22/24 0402 12/22/24 0737  BP: 115/82  (!) 105/52 122/74  Pulse: (!) 104  98 (!) 101  Resp: 18   19  Temp: 97.6 F (36.4 C)  97.6 F (36.4 C) 97.6 F (36.4 C)  TempSrc: Oral  Oral Oral  SpO2: 100%  100% 100%  Weight:  73.8 kg    Height:       Supplemental O2: Room Air SpO2: 100 %  Filed Weights   12/20/24 0500 12/21/24 0500 12/22/24 0156  Weight: 74 kg 73.6 kg 73.8 kg     Intake/Output Summary (Last 24 hours) at 12/22/2024 1049 Last data filed at 12/22/2024 0500 Gross per 24 hour  Intake 916.63 ml  Output --  Net 916.63 ml   Net IO Since Admission: 3,158.8 mL [12/22/24 1049]  Physical Exam: Physical Exam Constitutional:      General: She is not in acute distress.    Appearance: She is not ill-appearing, toxic-appearing or diaphoretic.  Cardiovascular:     Heart sounds: Normal heart sounds. No murmur heard.    No friction rub. No gallop.  Pulmonary:     Effort: Pulmonary effort  is normal. No respiratory distress.     Breath sounds: Normal breath sounds. No decreased breath sounds, wheezing, rhonchi or rales.  Abdominal:     General: Bowel sounds are normal.     Palpations: Abdomen is soft.     Tenderness: There is abdominal tenderness (under bruising/injection site). There is no guarding.  Musculoskeletal:     Right lower leg: No edema.     Left lower leg: No edema.  Skin:    General: Skin is warm and dry.  Neurological:     Mental Status: She is alert.     Patient Lines/Drains/Airways Status     Active Line/Drains/Airways     Name Placement date Placement time Site Days   Peripheral IV 12/14/24 20 G 1 Left Antecubital 12/14/24  0800  Antecubital  8   PICC Double Lumen 12/15/24 Right Basilic 37 cm 0 cm 12/15/24  8468  -- 7            Pertinent labs and imaging:      Latest Ref Rng & Units 12/20/2024    4:28 AM 12/19/2024    3:49 AM 12/16/2024    3:34 AM  CBC  WBC 4.0 - 10.5 K/uL 12.9  13.7  9.9   Hemoglobin 12.0 - 15.0 g/dL 89.2  88.4  87.1   Hematocrit 36.0 - 46.0 % 32.5  35.1  38.2  Platelets 150 - 400 K/uL 290  279  266        Latest Ref Rng & Units 12/22/2024    4:56 AM 12/21/2024    4:13 AM 12/20/2024    4:28 AM  CMP  Glucose 70 - 99 mg/dL 828  870  858   BUN 8 - 23 mg/dL 16  15  14    Creatinine 0.44 - 1.00 mg/dL 8.43  8.43  8.44   Sodium 135 - 145 mmol/L 134  135  135   Potassium 3.5 - 5.1 mmol/L 4.7  4.5  4.4   Chloride 98 - 111 mmol/L 103  103  104   CO2 22 - 32 mmol/L 22  23  22    Calcium  8.9 - 10.3 mg/dL 9.6  9.3  9.3      No results found.    ASSESSMENT/PLAN:  Assessment: Principal Problem:   Acute on chronic systolic heart failure (HCC) Active Problems:   Abdominal pain   Diabetes mellitus (HCC)   Plan: #Acute on Chronic HF --> Low output HF CAD Ischemic cardiomyopathy with [employer] CRT-D device and Barostim device  Chest Pain  PAD OR tomorrow. Co-Ox today 71.2%. Continuing current milrinone   infusion. No current complaints of CP or SOB. CVP 1.  - f/u LVAD consult (RV function good and no significant valvular disease) - appreciate cardiology recommendations:   - milrinone  infusion 0.375 mcg/kg/min  - ASA 81 mg  - Ezetimibe  10 mg and Crestor  40 mg daily, goal <55   - Imdur  15 mg daily   - holding Plavix  with LVAD consideration   - holding coreg  given low output state   - holding losartan              - holding spironolactone   - holding digoxin  given elevated level; may restart at 0.0625 mg every other day once renal function stabilizes.   - strict I/O and daily weights - Recommendations for CP  - continue Ranolazine  500 mg BID  - continue nitroglycerin  PRN  #Abdominal Pain  No acute concerns.   - continue protonix  40 mg daily  - continue bentyl  10 mg TID - continue maalox/mylanta bid - miralax  BID - senna BID  #AKI on CKD, resolved Baseline Cr around 1.5. Current: 1.55. on admit, 1.93. Likely cardiorenal in setting of low output HF.  - monitor daily BMP - avoid nephrotoxic medications  - renally dose medications   Chronic pain/fibromyalgia  - continue Tylenol  650 mg QID PRN - continue Flexeril  5 mg TID PRN - continue lidocaine  patch - continue voltaren  gel  - dilaudid  1 mg q 4 hours PRN - ubrogepant  50 mg daily PRN for HA  Type 2 DM 12/19/2024 A1C: 6.6 - continue lantus  25 U BID - continue SSI - continue jardiance  10 mg daily   Depression - continue trintellix  5 mg nightly   COPD -albuterol  PRN   Other home medications ordered - vitamin B12, folic acid , vit D ordered - albuterol  neb q 4 PRN  - claritin  10 mg daily  - valcyclovir 500 mg daily ordered   Best Practice: Diet: cardiac diet VTE: heparin  injection 5,000 Units Start: 12/15/24 2200 SCD's Start: 12/15/24 2122 Code: Full  Disposition planning: Therapy Recs: None, DME: none DISPO: will transfer to cardiology service after LVAD  Signature:  Larae Caison [facility]  Residency  10:49 AM, 12/22/2024  On Call pager [phone]  "

## 2024-12-22 NOTE — Progress Notes (Signed)
 Mobility Specialist Progress Note:    12/22/24 1430  Mobility  Activity Ambulated with assistance  Level of Assistance Standby assist, set-up cues, supervision of patient - no hands on  Assistive Device None  Distance Ambulated (ft) 100 ft  Range of Motion/Exercises Active  Activity Response Tolerated well  Mobility Referral Yes  Mobility visit 1 Mobility  Mobility Specialist Start Time (ACUTE ONLY) 1430  Mobility Specialist Stop Time (ACUTE ONLY) 1439  Mobility Specialist Time Calculation (min) (ACUTE ONLY) 9 min   Received pt sitting EOB agreeable to session. Somewhat fatigued but otherwise doing ok. Pt able to move and ambulate decently. Returned pt to room w/ all needs met.   Venetia Keel Mobility Specialist Please Neurosurgeon or Rehab Office at [phone]

## 2024-12-22 NOTE — Progress Notes (Addendum)
 "    Advanced Heart Failure Rounding Note  Cardiologist: Dub Huntsman, DO  [facility] Cardiologist: Dr. Rolan  Chief Complaint: A/c systolic HF w/ low output  Patient Profile   Fatema Rabe is a 74 y.o. female with with history of CAD and ischemic cardiomyopathy, EF <20%, CKD III and COPD admitted w/ a/c CHF, chest/abdominal pain. RHC c/w low output.   Significant events:   1/15: RHC (RA 2, PA 21/10, PCW 3,  PA sat 59%, TD CI 1.49, FICK CI 1.88, PAPi 5.5), started on milrinone            LHC patent stents, nonobstructive CAD   1/19: co-ox persistently in 40s, milrinone  increased 0.375  Subjective:    Co-ox 71% on milrinone  0.375 mcg/kg/min. CVP 0, weight stable. VSS. Awaiting OR tomorrow.   Sitting up on the edge of the bed. Feels well this morning, no CP or SOB. Ambulating around room as needed. Remains with a nagging headache, pain isn't bad.   Objective:    Weight Range: 73.8 kg Body mass index is 27.07 kg/m.   Vital Signs:   Temp:  [97.4 F (36.3 C)-98.1 F (36.7 C)] 97.6 F (36.4 C) (01/22 0402) Pulse Rate:  [98-116] 98 (01/22 0402) Resp:  [17-18] 18 (01/21 2308) BP: (105-132)/(52-87) 105/52 (01/22 0402) SpO2:  [96 %-100 %] 100 % (01/22 0402) Weight:  [73.8 kg] 73.8 kg (01/22 0156) Last BM Date : 12/19/24  Weight change: Filed Weights   12/20/24 0500 12/21/24 0500 12/22/24 0156  Weight: 74 kg 73.6 kg 73.8 kg   Intake/Output:  Intake/Output Summary (Last 24 hours) at 12/22/2024 0715 Last data filed at 12/22/2024 0500 Gross per 24 hour  Intake 1036.63 ml  Output --  Net 1036.63 ml    Physical Exam   General: Well appearing. No distress  Cardiac: JVP flat. No murmurs  Extremities: Warm and dry.  No edema.  Neuro: A&O x3. Affect pleasant.   Telemetry   SR 100-110s (personally reviewed)  Labs   CBC Recent Labs    12/20/24 0428  WBC 12.9*  HGB 10.7*  HCT 32.5*  MCV 97.0  PLT 290   Basic Metabolic Panel Recent Labs    98/79/73 0428  12/21/24 0413 12/22/24 0456  NA 135 135 134*  K 4.4 4.5 4.7  CL 104 103 103  CO2 22 23 22   GLUCOSE 141* 129* 171*  BUN 14 15 16   CREATININE 1.55* 1.56* 1.56*  CALCIUM  9.3 9.3 9.6  MG 2.5*  --   --    Liver Function Tests Recent Labs    12/19/24 1832  AST 18  ALT 8  ALKPHOS 54  BILITOT 0.3  PROT 6.2*  ALBUMIN 3.3*   BNP (last 3 results) Recent Labs    09/01/24 1457  BNP 169.4*   ProBNP (last 3 results) Recent Labs    12/13/24 1529 12/14/24 0826  PROBNP 1,342.0* 1,539.0*   Hemoglobin A1C No results for input(s): HGBA1C in the last 72 hours.  Medications:    Scheduled Medications:  alum & mag hydroxide-simeth  15 mL Oral BID   aspirin  EC  81 mg Oral Daily   Chlorhexidine  Gluconate Cloth  6 each Topical Daily   cyanocobalamin   1,000 mcg Oral Daily   diclofenac  Sodium  2 g Topical QID   dicyclomine   10 mg Oral TID AC   ezetimibe   10 mg Oral Daily   folic acid   1 mg Oral Daily   heparin   5,000 Units Subcutaneous  Q8H   insulin  aspart  0-15 Units Subcutaneous TID WC   insulin  glargine  25 Units Subcutaneous BID   isosorbide  mononitrate  15 mg Oral Daily   lactose free nutrition  237 mL Oral TID WC   lidocaine   1 patch Transdermal Q24H   loratadine   10 mg Oral Daily   multivitamin with minerals  1 tablet Oral Daily   pantoprazole   40 mg Oral Daily   polyethylene glycol  17 g Oral BID   ranolazine   500 mg Oral BID   rosuvastatin   40 mg Oral QHS   senna  1 tablet Oral BID   sodium chloride  flush  10-40 mL Intracatheter Q12H   sodium chloride  flush  3 mL Intravenous Q12H   valACYclovir   500 mg Oral Daily   Vitamin D  (Ergocalciferol )  50,000 Units Oral Q Sun   vortioxetine  HBr  5 mg Oral QHS    Infusions:  milrinone  0.375 mcg/kg/min (12/22/24 0500)    PRN Medications: acetaminophen , albuterol , bisacodyl , cyclobenzaprine , HYDROmorphone , nitroGLYCERIN , ondansetron , mouth rinse, sodium chloride , sodium chloride  flush, sodium chloride  flush,  Ubrogepant   Assessment/Plan   1. Acute on chronic systolic CHF: Ischemic cardiomyopathy.  [employer] CRT-D device.  Echo in 8/24 showed EF < 20%, moderate LV dilation, mild LVH, RV normal.  This is somewhat worse than priors. LHC/RHC in 11/24 showed low CI at 2.12, normal filling pressures, unchanged coronary anatomy with no severe stenoses. S/p barostimulation activation therapy device 2/25. Echo from [facility] showed EF 20-25%, paradoxical LV motion. Echo 12/25 showed EF<20%, normal RV.  We have been concerned about possible low output HF, wonder if this could be contributing to her GI symptoms.  RHC/LHC this admit showed stable coronary anatomy and low filling pressures but low output by Fick and thermodilution.  - She is end-stage NYHA IV - Co-ox 71% on milrinone  0.375 mcg/kg/min (increased for low output) - CVP <5. Does not need diuretics at this time - no jardiance  with tentative plans for OR.  - stop spiro with elevated Cr, can resume after VAD placement - losartan  held w/ elevated creatinine, can resume as Cr improves - digoxin  held with elevated level (1.2), can eventually restart at 0.0625 mg every other day.  - Patient and family are on board with LVAD. Her daughter plans to move in with her to be her caregiver. Seen by Dr. Jais Demir and felt to be good VAD candidate. Tentatively plan to undergo LVAD implantation on Friday (tomorrow). 2.  CAD: PCI to mid/distal LAD in 2020 and proximal LAD in 2021.  LHC in 11/24 showed stable coronary anatomy with no severe stenoses. She reported to the ER with 2 days of atypical chest pain.  TnT minimally elevated with no trend, this is probably due to demand ischemia.  Cath this admit showed stable coronary anatomy.  - Continue ranolazine  500 mg bid + imdur  - Continue ASA, hold Plavix  for now for LVAD consideration.  - Continue Crestor  + Zetia , goal LDL < 55. - No s/s angina 3. COPD: History of smoking, quit 2021.   4. CKD 3: Follows with  nephology at [facility]. Cr trending up. BP ok, co-ox ok. Off jardiance  for now.  - continue milrinone  per above  5. Obesity: Has been on semaglutide. 6. PAD: Moderately decreased ABI on right in 7/25. Medical mgmt per Dr. Serene. Again ABI on 1/19, the R had moderate disease, and mild on the L. No claudication or other PAD symptoms.  7. GI: RUQ and epigastric pain.  No evidence by US  for acute cholecystitis. GI symptoms seem to have improved with higher dose of milrinone , possible that symptoms were related to low cardiac output. - seen by GI. No need for further w/u at this time  Length of Stay: 7  Jordan Lee, NP  12/22/2024, 7:15 AM  Advanced Heart Failure Team Pager [phone] (M-F; 7a - 5p)   Please visit [email or web address]: For overnight coverage please call cardiology fellow first. If fellow not available call Shock/ECMO MD on call.  For ECMO / Mechanical Support (Impella, IABP, LVAD) issues call Shock / ECMO MD on call.    Remains on milrinone . Co-ox and CVP ok. Feels ok   General:  Sitting up in bed. No resp difficulty HEENT: normal Neck: supple. no JVD.  Cor: Regular rate & rhythm. No rubs, gallops or murmurs. Lungs: clear Abdomen: soft, nontender, nondistended.Good bowel sounds. Extremities: no cyanosis, clubbing, rash, edema Neuro: alert & orientedx3, cranial nerves grossly intact. moves all 4 extremities w/o difficulty. Affect pleasant  Inotrope dependent in setting of end-stage NYHAI HF due to iCM. Doing well on milrinone  0.375 mcg/kg/min. For VAD tomorrow. We discussed extensively. Continue milrinone ,.   Toribio Fuel, MD  1:15 PM  "

## 2024-12-22 NOTE — Progress Notes (Signed)
 H&V Care Navigation CSW Progress Note  Clinical Social Worker met with pt at bedside to check in prior to LVAD surgery tomorrow.  Pt confirms she is ready for surgery tomorrow- no concerns expressed.  CSW will continue to follow during implant hospital stay and assist as needed  Tifany Hirsch H. Parys Elenbaas, LCSW Clinical Social Worker Advanced Heart Failure Clinic Desk#: [phone] Cell#: [phone]

## 2024-12-22 NOTE — Progress Notes (Signed)
 7 Days Post-Op Procedures (LRB): RIGHT/LEFT HEART CATH AND CORONARY ANGIOGRAPHY (N/A) Subjective: Feels ok  Milrinone  0.375 with Co-ox 71.2  Objective: Vital signs in last 24 hours: Temp:  [97.4 F (36.3 C)-98.1 F (36.7 C)] 97.6 F (36.4 C) (01/22 0402) Pulse Rate:  [98-116] 98 (01/22 0402) Cardiac Rhythm: Sinus tachycardia (01/21 1908) Resp:  [17-18] 18 (01/21 2308) BP: (105-132)/(52-87) 105/52 (01/22 0402) SpO2:  [96 %-100 %] 100 % (01/22 0402) Weight:  [73.8 kg] 73.8 kg (01/22 0156)  Hemodynamic parameters for last 24 hours: CVP:  [6 mmHg-9 mmHg] 6 mmHg  Intake/Output from previous day: 01/21 0701 - 01/22 0700 In: 1036.6 [P.O.:840; I.V.:196.6] Out: -  Intake/Output this shift: No intake/output data recorded.  General appearance: alert and cooperative Neurologic: intact Heart: regular rate and rhythm Lungs: clear to auscultation bilaterally Extremities: no edema  Lab Results: Recent Labs    12/20/24 0428  WBC 12.9*  HGB 10.7*  HCT 32.5*  PLT 290   BMET:  Recent Labs    12/21/24 0413 12/22/24 0456  NA 135 134*  K 4.5 4.7  CL 103 103  CO2 23 22  GLUCOSE 129* 171*  BUN 15 16  CREATININE 1.56* 1.56*  CALCIUM  9.3 9.6    PT/INR: No results for input(s): LABPROT, INR in the last 72 hours. ABG    Component Value Date/Time   HCO3 27.1 12/15/2024 1125   HCO3 26.9 12/15/2024 1125   TCO2 28 12/15/2024 1125   TCO2 28 12/15/2024 1125   ACIDBASEDEF 1.0 10/13/2023 1108   ACIDBASEDEF 1.0 10/13/2023 1108   O2SAT 71.2 12/22/2024 0456   CBG (last 3)  Recent Labs    12/21/24 1547 12/21/24 2108 12/22/24 0617  GLUCAP 159* 202* 120*    Assessment/Plan:  Acute on chronic systolic CHF with EF< 20% on milrinone  0.375.   Stage 3 CKD with creat stable at 1.56  Agree with need for Heartmate 3 LVAD as destination therapy.   I discussed the operative procedure with the patient including alternatives, benefits and risks; including but not limited to  bleeding, blood transfusion, infection, stroke, pump failure, organ dysfunction, and death.  Karen Warner Karen Warner understands and agrees to proceed.  We will schedule surgery for tomorrow morning.  LOS: 7 days    Karen Warner 12/22/2024

## 2024-12-22 NOTE — Progress Notes (Signed)
" ° ° °  PROCEDURAL EXPEDITER PROGRESS NOTE  Patient Name: Karen Warner  DOB:[birth year] Date of Admission: 12/14/2024  Date of Assessment:12/22/24   -------------------------------------------------------------------------------------------------------------------   Brief clinical summary: pt is a 74 yr old female having a insertion of implantable lefat ventricular assist device.   Orders in place:  Yes   Communication with surgical team if no orders: n/a  Labs, test, and orders reviewed: yes  Requires surgical clearance:  No  What type of clearance: n/a  Clearance received: n/a  Barriers noted:n/s   Intervention provided by [facility] team: n/s  Barrier resolved: N/A   -------------------------------------------------------------------------------------------------------------------  [employer], [name] Please contact us  directly via secure chat (search for [facility]) or by calling us  at [phone] [facility]).  "

## 2024-12-23 ENCOUNTER — Encounter (HOSPITAL_COMMUNITY): Payer: Self-pay | Admitting: Infectious Diseases

## 2024-12-23 ENCOUNTER — Inpatient Hospital Stay (HOSPITAL_COMMUNITY): Payer: Self-pay | Admitting: Certified Registered Nurse Anesthetist

## 2024-12-23 ENCOUNTER — Encounter (HOSPITAL_COMMUNITY): Admission: EM | Payer: Self-pay | Source: Home / Self Care

## 2024-12-23 ENCOUNTER — Encounter: Payer: Self-pay | Admitting: Student

## 2024-12-23 ENCOUNTER — Inpatient Hospital Stay (HOSPITAL_COMMUNITY)

## 2024-12-23 DIAGNOSIS — I10 Essential (primary) hypertension: Secondary | ICD-10-CM | POA: Diagnosis not present

## 2024-12-23 DIAGNOSIS — I5084 End stage heart failure: Secondary | ICD-10-CM

## 2024-12-23 DIAGNOSIS — R739 Hyperglycemia, unspecified: Secondary | ICD-10-CM | POA: Diagnosis not present

## 2024-12-23 DIAGNOSIS — K219 Gastro-esophageal reflux disease without esophagitis: Secondary | ICD-10-CM | POA: Diagnosis not present

## 2024-12-23 DIAGNOSIS — E119 Type 2 diabetes mellitus without complications: Secondary | ICD-10-CM | POA: Diagnosis not present

## 2024-12-23 DIAGNOSIS — Z95811 Presence of heart assist device: Secondary | ICD-10-CM

## 2024-12-23 DIAGNOSIS — Z87891 Personal history of nicotine dependence: Secondary | ICD-10-CM

## 2024-12-23 DIAGNOSIS — I255 Ischemic cardiomyopathy: Secondary | ICD-10-CM | POA: Diagnosis not present

## 2024-12-23 DIAGNOSIS — N1832 Chronic kidney disease, stage 3b: Secondary | ICD-10-CM | POA: Diagnosis not present

## 2024-12-23 DIAGNOSIS — I5023 Acute on chronic systolic (congestive) heart failure: Secondary | ICD-10-CM

## 2024-12-23 LAB — POCT I-STAT, CHEM 8
BUN: 15 mg/dL (ref 8–23)
BUN: 14 mg/dL (ref 8–23)
BUN: 12 mg/dL (ref 8–23)
BUN: 14 mg/dL (ref 8–23)
BUN: 14 mg/dL (ref 8–23)
Calcium, Ion: 1.28 mmol/L (ref 1.15–1.40)
Calcium, Ion: 1.22 mmol/L (ref 1.15–1.40)
Calcium, Ion: 0.98 mmol/L — ABNORMAL LOW (ref 1.15–1.40)
Calcium, Ion: 1.03 mmol/L — ABNORMAL LOW (ref 1.15–1.40)
Calcium, Ion: 1.04 mmol/L — ABNORMAL LOW (ref 1.15–1.40)
Chloride: 107 mmol/L (ref 98–111)
Chloride: 105 mmol/L (ref 98–111)
Chloride: 99 mmol/L (ref 98–111)
Chloride: 100 mmol/L (ref 98–111)
Chloride: 99 mmol/L (ref 98–111)
Creatinine, Ser: 1.5 mg/dL — ABNORMAL HIGH (ref 0.44–1.00)
Creatinine, Ser: 1.5 mg/dL — ABNORMAL HIGH (ref 0.44–1.00)
Creatinine, Ser: 1.4 mg/dL — ABNORMAL HIGH (ref 0.44–1.00)
Creatinine, Ser: 1.3 mg/dL — ABNORMAL HIGH (ref 0.44–1.00)
Creatinine, Ser: 1.4 mg/dL — ABNORMAL HIGH (ref 0.44–1.00)
Glucose, Bld: 136 mg/dL — ABNORMAL HIGH (ref 70–99)
Glucose, Bld: 147 mg/dL — ABNORMAL HIGH (ref 70–99)
Glucose, Bld: 149 mg/dL — ABNORMAL HIGH (ref 70–99)
Glucose, Bld: 167 mg/dL — ABNORMAL HIGH (ref 70–99)
Glucose, Bld: 168 mg/dL — ABNORMAL HIGH (ref 70–99)
HCT: 35 % — ABNORMAL LOW (ref 36.0–46.0)
HCT: 31 % — ABNORMAL LOW (ref 36.0–46.0)
HCT: 24 % — ABNORMAL LOW (ref 36.0–46.0)
HCT: 23 % — ABNORMAL LOW (ref 36.0–46.0)
HCT: 24 % — ABNORMAL LOW (ref 36.0–46.0)
Hemoglobin: 11.9 g/dL — ABNORMAL LOW (ref 12.0–15.0)
Hemoglobin: 10.5 g/dL — ABNORMAL LOW (ref 12.0–15.0)
Hemoglobin: 8.2 g/dL — ABNORMAL LOW (ref 12.0–15.0)
Hemoglobin: 7.8 g/dL — ABNORMAL LOW (ref 12.0–15.0)
Hemoglobin: 8.2 g/dL — ABNORMAL LOW (ref 12.0–15.0)
Potassium: 4.1 mmol/L (ref 3.5–5.1)
Potassium: 4.4 mmol/L (ref 3.5–5.1)
Potassium: 4.9 mmol/L (ref 3.5–5.1)
Potassium: 4.4 mmol/L (ref 3.5–5.1)
Potassium: 4.6 mmol/L (ref 3.5–5.1)
Sodium: 141 mmol/L (ref 135–145)
Sodium: 137 mmol/L (ref 135–145)
Sodium: 138 mmol/L (ref 135–145)
Sodium: 138 mmol/L (ref 135–145)
Sodium: 139 mmol/L (ref 135–145)
TCO2: 21 mmol/L — ABNORMAL LOW (ref 22–32)
TCO2: 20 mmol/L — ABNORMAL LOW (ref 22–32)
TCO2: 27 mmol/L (ref 22–32)
TCO2: 26 mmol/L (ref 22–32)
TCO2: 26 mmol/L (ref 22–32)

## 2024-12-23 LAB — BASIC METABOLIC PANEL WITH GFR
Anion gap: 10 (ref 5–15)
Anion gap: 13 (ref 5–15)
BUN: 13 mg/dL (ref 8–23)
BUN: 12 mg/dL (ref 8–23)
CO2: 17 mmol/L — ABNORMAL LOW (ref 22–32)
CO2: 19 mmol/L — ABNORMAL LOW (ref 22–32)
Calcium: 7.5 mg/dL — ABNORMAL LOW (ref 8.9–10.3)
Calcium: 8.5 mg/dL — ABNORMAL LOW (ref 8.9–10.3)
Chloride: 111 mmol/L (ref 98–111)
Chloride: 105 mmol/L (ref 98–111)
Creatinine, Ser: 1.36 mg/dL — ABNORMAL HIGH (ref 0.44–1.00)
Creatinine, Ser: 1.53 mg/dL — ABNORMAL HIGH (ref 0.44–1.00)
GFR, Estimated: 41 mL/min — ABNORMAL LOW
GFR, Estimated: 35 mL/min — ABNORMAL LOW
Glucose, Bld: 183 mg/dL — ABNORMAL HIGH (ref 70–99)
Glucose, Bld: 209 mg/dL — ABNORMAL HIGH (ref 70–99)
Potassium: 3.7 mmol/L (ref 3.5–5.1)
Potassium: 4.9 mmol/L (ref 3.5–5.1)
Sodium: 137 mmol/L (ref 135–145)
Sodium: 138 mmol/L (ref 135–145)

## 2024-12-23 LAB — POCT I-STAT 7, (LYTES, BLD GAS, ICA,H+H)
Acid-Base Excess: 0 mmol/L (ref 0.0–2.0)
Acid-Base Excess: 1 mmol/L (ref 0.0–2.0)
Acid-base deficit: 8 mmol/L — ABNORMAL HIGH (ref 0.0–2.0)
Acid-base deficit: 3 mmol/L — ABNORMAL HIGH (ref 0.0–2.0)
Acid-base deficit: 3 mmol/L — ABNORMAL HIGH (ref 0.0–2.0)
Acid-base deficit: 6 mmol/L — ABNORMAL HIGH (ref 0.0–2.0)
Bicarbonate: 17.5 mmol/L — ABNORMAL LOW (ref 20.0–28.0)
Bicarbonate: 20.6 mmol/L (ref 20.0–28.0)
Bicarbonate: 23.9 mmol/L (ref 20.0–28.0)
Bicarbonate: 24 mmol/L (ref 20.0–28.0)
Bicarbonate: 21.4 mmol/L (ref 20.0–28.0)
Bicarbonate: 19.4 mmol/L — ABNORMAL LOW (ref 20.0–28.0)
Calcium, Ion: 1.22 mmol/L (ref 1.15–1.40)
Calcium, Ion: 0.97 mmol/L — ABNORMAL LOW (ref 1.15–1.40)
Calcium, Ion: 0.95 mmol/L — ABNORMAL LOW (ref 1.15–1.40)
Calcium, Ion: 1.02 mmol/L — ABNORMAL LOW (ref 1.15–1.40)
Calcium, Ion: 1.04 mmol/L — ABNORMAL LOW (ref 1.15–1.40)
Calcium, Ion: 1.23 mmol/L (ref 1.15–1.40)
HCT: 33 % — ABNORMAL LOW (ref 36.0–46.0)
HCT: 21 % — ABNORMAL LOW (ref 36.0–46.0)
HCT: 22 % — ABNORMAL LOW (ref 36.0–46.0)
HCT: 29 % — ABNORMAL LOW (ref 36.0–46.0)
HCT: 27 % — ABNORMAL LOW (ref 36.0–46.0)
HCT: 27 % — ABNORMAL LOW (ref 36.0–46.0)
Hemoglobin: 11.2 g/dL — ABNORMAL LOW (ref 12.0–15.0)
Hemoglobin: 7.1 g/dL — ABNORMAL LOW (ref 12.0–15.0)
Hemoglobin: 7.5 g/dL — ABNORMAL LOW (ref 12.0–15.0)
Hemoglobin: 9.9 g/dL — ABNORMAL LOW (ref 12.0–15.0)
Hemoglobin: 9.2 g/dL — ABNORMAL LOW (ref 12.0–15.0)
Hemoglobin: 9.2 g/dL — ABNORMAL LOW (ref 12.0–15.0)
O2 Saturation: 100 %
O2 Saturation: 100 %
O2 Saturation: 100 %
O2 Saturation: 100 %
O2 Saturation: 99 %
O2 Saturation: 100 %
Patient temperature: 98.3
Patient temperature: 37
Potassium: 3.9 mmol/L (ref 3.5–5.1)
Potassium: 4.7 mmol/L (ref 3.5–5.1)
Potassium: 4.4 mmol/L (ref 3.5–5.1)
Potassium: 4.7 mmol/L (ref 3.5–5.1)
Potassium: 3.6 mmol/L (ref 3.5–5.1)
Potassium: 4.8 mmol/L (ref 3.5–5.1)
Sodium: 141 mmol/L (ref 135–145)
Sodium: 136 mmol/L (ref 135–145)
Sodium: 136 mmol/L (ref 135–145)
Sodium: 138 mmol/L (ref 135–145)
Sodium: 140 mmol/L (ref 135–145)
Sodium: 139 mmol/L (ref 135–145)
TCO2: 18 mmol/L — ABNORMAL LOW (ref 22–32)
TCO2: 21 mmol/L — ABNORMAL LOW (ref 22–32)
TCO2: 25 mmol/L (ref 22–32)
TCO2: 25 mmol/L (ref 22–32)
TCO2: 22 mmol/L (ref 22–32)
TCO2: 20 mmol/L — ABNORMAL LOW (ref 22–32)
pCO2 arterial: 33.8 mmHg (ref 32–48)
pCO2 arterial: 29.5 mmHg — ABNORMAL LOW (ref 32–48)
pCO2 arterial: 31.8 mmHg — ABNORMAL LOW (ref 32–48)
pCO2 arterial: 35.7 mmHg (ref 32–48)
pCO2 arterial: 34.7 mmHg (ref 32–48)
pCO2 arterial: 36.8 mmHg (ref 32–48)
pH, Arterial: 7.321 — ABNORMAL LOW (ref 7.35–7.45)
pH, Arterial: 7.452 — ABNORMAL HIGH (ref 7.35–7.45)
pH, Arterial: 7.436 (ref 7.35–7.45)
pH, Arterial: 7.483 — ABNORMAL HIGH (ref 7.35–7.45)
pH, Arterial: 7.397 (ref 7.35–7.45)
pH, Arterial: 7.329 — ABNORMAL LOW (ref 7.35–7.45)
pO2, Arterial: 365 mmHg — ABNORMAL HIGH (ref 83–108)
pO2, Arterial: 370 mmHg — ABNORMAL HIGH (ref 83–108)
pO2, Arterial: 284 mmHg — ABNORMAL HIGH (ref 83–108)
pO2, Arterial: 369 mmHg — ABNORMAL HIGH (ref 83–108)
pO2, Arterial: 150 mmHg — ABNORMAL HIGH (ref 83–108)
pO2, Arterial: 204 mmHg — ABNORMAL HIGH (ref 83–108)

## 2024-12-23 LAB — CBC
HCT: 35.5 % — ABNORMAL LOW (ref 36.0–46.0)
HCT: 26 % — ABNORMAL LOW (ref 36.0–46.0)
HCT: 27.2 % — ABNORMAL LOW (ref 36.0–46.0)
Hemoglobin: 11.6 g/dL — ABNORMAL LOW (ref 12.0–15.0)
Hemoglobin: 8.9 g/dL — ABNORMAL LOW (ref 12.0–15.0)
Hemoglobin: 9.2 g/dL — ABNORMAL LOW (ref 12.0–15.0)
MCH: 31.8 pg (ref 26.0–34.0)
MCH: 31.8 pg (ref 26.0–34.0)
MCH: 31.7 pg (ref 26.0–34.0)
MCHC: 32.7 g/dL (ref 30.0–36.0)
MCHC: 34.2 g/dL (ref 30.0–36.0)
MCHC: 33.8 g/dL (ref 30.0–36.0)
MCV: 97.3 fL (ref 80.0–100.0)
MCV: 92.9 fL (ref 80.0–100.0)
MCV: 93.8 fL (ref 80.0–100.0)
Platelets: 283 K/uL (ref 150–400)
Platelets: 136 K/uL — ABNORMAL LOW (ref 150–400)
Platelets: 170 K/uL (ref 150–400)
RBC: 3.65 MIL/uL — ABNORMAL LOW (ref 3.87–5.11)
RBC: 2.8 MIL/uL — ABNORMAL LOW (ref 3.87–5.11)
RBC: 2.9 MIL/uL — ABNORMAL LOW (ref 3.87–5.11)
RDW: 14.1 % (ref 11.5–15.5)
RDW: 15.7 % — ABNORMAL HIGH (ref 11.5–15.5)
RDW: 16.5 % — ABNORMAL HIGH (ref 11.5–15.5)
WBC: 10 K/uL (ref 4.0–10.5)
WBC: 14.7 K/uL — ABNORMAL HIGH (ref 4.0–10.5)
WBC: 14.3 K/uL — ABNORMAL HIGH (ref 4.0–10.5)
nRBC: 0.7 % — ABNORMAL HIGH (ref 0.0–0.2)
nRBC: 0.4 % — ABNORMAL HIGH (ref 0.0–0.2)
nRBC: 0.5 % — ABNORMAL HIGH (ref 0.0–0.2)

## 2024-12-23 LAB — GLUCOSE, CAPILLARY
Glucose-Capillary: 148 mg/dL — ABNORMAL HIGH (ref 70–99)
Glucose-Capillary: 180 mg/dL — ABNORMAL HIGH (ref 70–99)
Glucose-Capillary: 157 mg/dL — ABNORMAL HIGH (ref 70–99)
Glucose-Capillary: 107 mg/dL — ABNORMAL HIGH (ref 70–99)
Glucose-Capillary: 188 mg/dL — ABNORMAL HIGH (ref 70–99)
Glucose-Capillary: 211 mg/dL — ABNORMAL HIGH (ref 70–99)
Glucose-Capillary: 205 mg/dL — ABNORMAL HIGH (ref 70–99)
Glucose-Capillary: 195 mg/dL — ABNORMAL HIGH (ref 70–99)
Glucose-Capillary: 219 mg/dL — ABNORMAL HIGH (ref 70–99)
Glucose-Capillary: 155 mg/dL — ABNORMAL HIGH (ref 70–99)
Glucose-Capillary: 100 mg/dL — ABNORMAL HIGH (ref 70–99)
Glucose-Capillary: 229 mg/dL — ABNORMAL HIGH (ref 70–99)

## 2024-12-23 LAB — PROTIME-INR
INR: 1.3 — ABNORMAL HIGH (ref 0.8–1.2)
Prothrombin Time: 17 s — ABNORMAL HIGH (ref 11.4–15.2)

## 2024-12-23 LAB — DIC (DISSEMINATED INTRAVASCULAR COAGULATION)PANEL
D-Dimer, Quant: 0.98 ug{FEU}/mL — ABNORMAL HIGH (ref 0.00–0.50)
Fibrinogen: 378 mg/dL (ref 210–475)
INR: 1.3 — ABNORMAL HIGH (ref 0.8–1.2)
Platelets: 182 K/uL (ref 150–400)
Prothrombin Time: 16.7 s — ABNORMAL HIGH (ref 11.4–15.2)
Smear Review: NONE SEEN
aPTT: 74 s — ABNORMAL HIGH (ref 24–36)

## 2024-12-23 LAB — POCT I-STAT EG7
Acid-base deficit: 2 mmol/L (ref 0.0–2.0)
Acid-base deficit: 2 mmol/L (ref 0.0–2.0)
Bicarbonate: 23.2 mmol/L (ref 20.0–28.0)
Bicarbonate: 21.8 mmol/L (ref 20.0–28.0)
Calcium, Ion: 1.31 mmol/L (ref 1.15–1.40)
Calcium, Ion: 1 mmol/L — ABNORMAL LOW (ref 1.15–1.40)
HCT: 35 % — ABNORMAL LOW (ref 36.0–46.0)
HCT: 20 % — ABNORMAL LOW (ref 36.0–46.0)
Hemoglobin: 11.9 g/dL — ABNORMAL LOW (ref 12.0–15.0)
Hemoglobin: 6.8 g/dL — CL (ref 12.0–15.0)
O2 Saturation: 63 %
O2 Saturation: 71 %
Potassium: 4.4 mmol/L (ref 3.5–5.1)
Potassium: 4.7 mmol/L (ref 3.5–5.1)
Sodium: 139 mmol/L (ref 135–145)
Sodium: 137 mmol/L (ref 135–145)
TCO2: 24 mmol/L (ref 22–32)
TCO2: 23 mmol/L (ref 22–32)
pCO2, Ven: 41.7 mmHg — ABNORMAL LOW (ref 44–60)
pCO2, Ven: 33.3 mmHg — ABNORMAL LOW (ref 44–60)
pH, Ven: 7.354 (ref 7.25–7.43)
pH, Ven: 7.423 (ref 7.25–7.43)
pO2, Ven: 34 mmHg (ref 32–45)
pO2, Ven: 36 mmHg (ref 32–45)

## 2024-12-23 LAB — MAGNESIUM
Magnesium: 2.4 mg/dL (ref 1.7–2.4)
Magnesium: 3.3 mg/dL — ABNORMAL HIGH (ref 1.7–2.4)

## 2024-12-23 LAB — COMPREHENSIVE METABOLIC PANEL WITH GFR
ALT: 8 U/L (ref 0–44)
AST: 19 U/L (ref 15–41)
Albumin: 4 g/dL (ref 3.5–5.0)
Alkaline Phosphatase: 62 U/L (ref 38–126)
Anion gap: 11 (ref 5–15)
BUN: 16 mg/dL (ref 8–23)
CO2: 22 mmol/L (ref 22–32)
Calcium: 9.6 mg/dL (ref 8.9–10.3)
Chloride: 101 mmol/L (ref 98–111)
Creatinine, Ser: 1.75 mg/dL — ABNORMAL HIGH (ref 0.44–1.00)
GFR, Estimated: 30 mL/min — ABNORMAL LOW
Glucose, Bld: 182 mg/dL — ABNORMAL HIGH (ref 70–99)
Potassium: 4.4 mmol/L (ref 3.5–5.1)
Sodium: 134 mmol/L — ABNORMAL LOW (ref 135–145)
Total Bilirubin: 0.3 mg/dL (ref 0.0–1.2)
Total Protein: 7.3 g/dL (ref 6.5–8.1)

## 2024-12-23 LAB — ECHO INTRAOPERATIVE TEE
Height: 65 in
Weight: 2603.19 [oz_av]

## 2024-12-23 LAB — COOXEMETRY PANEL
Carboxyhemoglobin: 2 % — ABNORMAL HIGH (ref 0.5–1.5)
Carboxyhemoglobin: 2 % — ABNORMAL HIGH (ref 0.5–1.5)
Carboxyhemoglobin: 2.1 % — ABNORMAL HIGH (ref 0.5–1.5)
Methemoglobin: <0.7 % (ref 0.0–1.5)
Methemoglobin: 0.8 % (ref 0.0–1.5)
Methemoglobin: <0.7 % (ref 0.0–1.5)
O2 Saturation: 68.6 %
O2 Saturation: 82.4 %
O2 Saturation: 80 %
Total hemoglobin: 10.3 g/dL — ABNORMAL LOW (ref 12.0–16.0)
Total hemoglobin: 9.1 g/dL — ABNORMAL LOW (ref 12.0–16.0)
Total hemoglobin: 9.3 g/dL — ABNORMAL LOW (ref 12.0–16.0)

## 2024-12-23 LAB — PREPARE RBC (CROSSMATCH)

## 2024-12-23 LAB — HEMOGLOBIN AND HEMATOCRIT, BLOOD
HCT: 22.5 % — ABNORMAL LOW (ref 36.0–46.0)
Hemoglobin: 7.6 g/dL — ABNORMAL LOW (ref 12.0–15.0)

## 2024-12-23 LAB — FIBRINOGEN: Fibrinogen: 309 mg/dL (ref 210–475)

## 2024-12-23 LAB — TROPONIN T, HIGH SENSITIVITY
Troponin T High Sensitivity: 37 ng/L — ABNORMAL HIGH (ref 0–19)
Troponin T High Sensitivity: 40 ng/L — ABNORMAL HIGH (ref 0–19)

## 2024-12-23 LAB — PLATELET COUNT: Platelets: 174 K/uL (ref 150–400)

## 2024-12-23 LAB — APTT: aPTT: 36 s (ref 24–36)

## 2024-12-23 MED ORDER — FENTANYL CITRATE (PF) 250 MCG/5ML IJ SOLN
INTRAMUSCULAR | Status: AC
  Filled 2024-12-23: qty 5

## 2024-12-23 MED ORDER — LACTATED RINGERS IV SOLN: INTRAVENOUS | Status: DC | PRN

## 2024-12-23 MED ORDER — HEPARIN 6000 UNIT IRRIGATION SOLUTION
Status: AC
  Filled 2024-12-23: qty 500

## 2024-12-23 MED ORDER — FENTANYL CITRATE (PF) 50 MCG/ML IJ SOSY
25.0000 ug | PREFILLED_SYRINGE | INTRAMUSCULAR | Status: DC | PRN
  Administered 2024-12-23: 50 ug via INTRAVENOUS
  Filled 2024-12-23: qty 2
  Filled 2024-12-23: qty 1

## 2024-12-23 MED ORDER — CALCIUM CHLORIDE 10 % IV SOLN
1.0000 g | Freq: Once | INTRAVENOUS | Status: AC
  Administered 2024-12-23: 1 g via INTRAVENOUS

## 2024-12-23 MED ORDER — VANCOMYCIN HCL 1000 MG IV SOLR
INTRAVENOUS | Status: DC | PRN
  Administered 2024-12-23: 1000 mg via TOPICAL

## 2024-12-23 MED ORDER — HEMOSTATIC AGENTS (NO CHARGE) OPTIME
TOPICAL | Status: DC | PRN
  Administered 2024-12-23: 1 via TOPICAL

## 2024-12-23 MED ORDER — ORAL CARE MOUTH RINSE: 15.0000 mL | OROMUCOSAL | Status: DC | PRN

## 2024-12-23 MED ORDER — FENTANYL BOLUS VIA INFUSION
25.0000 ug | INTRAVENOUS | Status: DC | PRN
  Administered 2024-12-25: 50 ug via INTRAVENOUS
  Administered 2024-12-25: 100 ug via INTRAVENOUS
  Administered 2024-12-25 – 2024-12-26 (×3): 50 ug via INTRAVENOUS
  Administered 2024-12-26: 25 ug via INTRAVENOUS
  Administered 2024-12-26: 100 ug via INTRAVENOUS
  Administered 2024-12-26 – 2024-12-27 (×2): 50 ug via INTRAVENOUS
  Administered 2024-12-27 (×4): 100 ug via INTRAVENOUS

## 2024-12-23 MED ORDER — THROMBIN 20000 UNITS EX KIT
PACK | CUTANEOUS | Status: DC | PRN
  Administered 2024-12-23: 20000 [IU] via TOPICAL

## 2024-12-23 MED ORDER — DEXTROSE 50 % IV SOLN: 0.0000 mL | INTRAVENOUS | Status: AC | PRN

## 2024-12-23 MED ORDER — PHENYLEPHRINE 80 MCG/ML (10ML) SYRINGE FOR IV PUSH (FOR BLOOD PRESSURE SUPPORT)
PREFILLED_SYRINGE | INTRAVENOUS | Status: DC | PRN
  Administered 2024-12-23 (×3): 80 ug via INTRAVENOUS
  Administered 2024-12-23: 40 ug via INTRAVENOUS
  Administered 2024-12-23: 80 ug via INTRAVENOUS
  Administered 2024-12-23: 40 ug via INTRAVENOUS

## 2024-12-23 MED ORDER — HEPARIN 6000 UNIT IRRIGATION SOLUTION
Status: DC | PRN
  Administered 2024-12-23: 1

## 2024-12-23 MED ORDER — CHLORHEXIDINE GLUCONATE 0.12 % MT SOLN
OROMUCOSAL | Status: AC
  Filled 2024-12-23: qty 15

## 2024-12-23 MED ORDER — CHLORHEXIDINE GLUCONATE CLOTH 2 % EX PADS
6.0000 | MEDICATED_PAD | Freq: Every day | CUTANEOUS | Status: AC
  Administered 2024-12-23 – 2025-01-06 (×15): 6 via TOPICAL

## 2024-12-23 MED ORDER — ALBUMIN HUMAN 5 % IV SOLN
250.0000 mL | INTRAVENOUS | Status: AC | PRN
  Administered 2024-12-23 (×2): 12.5 g via INTRAVENOUS

## 2024-12-23 MED ORDER — SODIUM CHLORIDE 0.9 % IV SOLN: 250.0000 mL | INTRAVENOUS | Status: AC

## 2024-12-23 MED ORDER — SODIUM CHLORIDE 0.9% FLUSH
3.0000 mL | Freq: Two times a day (BID) | INTRAVENOUS | Status: AC
  Administered 2024-12-24 – 2025-01-06 (×21): 3 mL via INTRAVENOUS

## 2024-12-23 MED ORDER — MIDAZOLAM HCL (PF) 2 MG/2ML IJ SOLN
2.0000 mg | INTRAMUSCULAR | Status: DC | PRN
  Administered 2024-12-23 – 2024-12-24 (×5): 2 mg via INTRAVENOUS
  Filled 2024-12-23 (×5): qty 2

## 2024-12-23 MED ORDER — ACETAMINOPHEN 160 MG/5ML PO SOLN
1000.0000 mg | Freq: Four times a day (QID) | ORAL | Status: DC
  Administered 2024-12-24 – 2024-12-27 (×10): 1000 mg
  Filled 2024-12-23 (×10): qty 40.6

## 2024-12-23 MED ORDER — MILRINONE LACTATE IN DEXTROSE 20-5 MG/100ML-% IV SOLN
0.2500 ug/kg/min | INTRAVENOUS | Status: DC
  Administered 2024-12-23 – 2024-12-24 (×2): 0.25 ug/kg/min via INTRAVENOUS
  Filled 2024-12-23 (×2): qty 100

## 2024-12-23 MED ORDER — VANCOMYCIN HCL IN DEXTROSE 1-5 GM/200ML-% IV SOLN
1000.0000 mg | Freq: Two times a day (BID) | INTRAVENOUS | Status: AC
  Administered 2024-12-23 – 2024-12-24 (×3): 1000 mg via INTRAVENOUS
  Filled 2024-12-23 (×3): qty 200

## 2024-12-23 MED ORDER — CEFAZOLIN SODIUM-DEXTROSE 2-4 GM/100ML-% IV SOLN
2.0000 g | Freq: Three times a day (TID) | INTRAVENOUS | Status: AC
  Administered 2024-12-23 – 2024-12-25 (×6): 2 g via INTRAVENOUS
  Filled 2024-12-23 (×6): qty 100

## 2024-12-23 MED ORDER — ROCURONIUM BROMIDE 10 MG/ML (PF) SYRINGE
PREFILLED_SYRINGE | INTRAVENOUS | Status: AC
  Filled 2024-12-23: qty 10

## 2024-12-23 MED ORDER — DEXMEDETOMIDINE HCL IN NACL 400 MCG/100ML IV SOLN
0.0000 ug/kg/h | INTRAVENOUS | Status: DC
  Administered 2024-12-23 – 2024-12-24 (×4): 0.7 ug/kg/h via INTRAVENOUS
  Filled 2024-12-23 (×3): qty 100

## 2024-12-23 MED ORDER — INSULIN REGULAR(HUMAN) IN NACL 100-0.9 UT/100ML-% IV SOLN: INTRAVENOUS | Status: AC

## 2024-12-23 MED ORDER — MAGNESIUM SULFATE 4 GM/100ML IV SOLN
4.0000 g | Freq: Once | INTRAVENOUS | Status: AC
  Administered 2024-12-23: 4 g via INTRAVENOUS
  Filled 2024-12-23: qty 100

## 2024-12-23 MED ORDER — LACTATED RINGERS IV SOLN: INTRAVENOUS | Status: AC

## 2024-12-23 MED ORDER — CALCIUM CHLORIDE 10 % IV SOLN
INTRAVENOUS | Status: AC
  Filled 2024-12-23: qty 10

## 2024-12-23 MED ORDER — SODIUM CHLORIDE 0.45 % IV SOLN: INTRAVENOUS | Status: AC | PRN

## 2024-12-23 MED ORDER — SODIUM CHLORIDE 0.9 % IV SOLN: INTRAVENOUS | Status: AC

## 2024-12-23 MED ORDER — CHLORHEXIDINE GLUCONATE 0.12 % MT SOLN
15.0000 mL | OROMUCOSAL | Status: AC
  Administered 2024-12-23: 15 mL via OROMUCOSAL
  Filled 2024-12-23: qty 15

## 2024-12-23 MED ORDER — PROPOFOL 10 MG/ML IV BOLUS
INTRAVENOUS | Status: AC
  Filled 2024-12-23: qty 20

## 2024-12-23 MED ORDER — CALCIUM CHLORIDE 10 % IV SOLN
INTRAVENOUS | Status: DC | PRN
  Administered 2024-12-23: 100 mg via INTRAVENOUS

## 2024-12-23 MED ORDER — MIDAZOLAM HCL 5 MG/5ML IJ SOLN
INTRAMUSCULAR | Status: DC | PRN
  Administered 2024-12-23: 2 mg via INTRAVENOUS
  Administered 2024-12-23 (×3): 1 mg via INTRAVENOUS

## 2024-12-23 MED ORDER — ACETAMINOPHEN 650 MG RE SUPP: 650.0000 mg | Freq: Once | RECTAL | Status: AC

## 2024-12-23 MED ORDER — DOCUSATE SODIUM 100 MG PO CAPS
200.0000 mg | ORAL_CAPSULE | Freq: Every day | ORAL | Status: DC
  Administered 2024-12-24 – 2024-12-25 (×2): 200 mg via ORAL
  Filled 2024-12-23 (×2): qty 2

## 2024-12-23 MED ORDER — PROPOFOL 10 MG/ML IV BOLUS
INTRAVENOUS | Status: DC | PRN
  Administered 2024-12-23: 30 mg via INTRAVENOUS
  Administered 2024-12-23 (×2): 20 mg via INTRAVENOUS
  Administered 2024-12-23 (×2): 40 mg via INTRAVENOUS
  Administered 2024-12-23: 20 mg via INTRAVENOUS

## 2024-12-23 MED ORDER — NOREPINEPHRINE 4 MG/250ML-% IV SOLN: 0.0000 ug/min | INTRAVENOUS | Status: DC

## 2024-12-23 MED ORDER — BISACODYL 5 MG PO TBEC
10.0000 mg | DELAYED_RELEASE_TABLET | Freq: Every day | ORAL | Status: DC
  Administered 2024-12-24 – 2024-12-25 (×2): 10 mg via ORAL
  Filled 2024-12-23 (×2): qty 2

## 2024-12-23 MED ORDER — ORAL CARE MOUTH RINSE
15.0000 mL | OROMUCOSAL | Status: DC
  Administered 2024-12-23 – 2024-12-24 (×14): 15 mL via OROMUCOSAL

## 2024-12-23 MED ORDER — CHLORHEXIDINE GLUCONATE 0.12 % MT SOLN: 15.0000 mL | Freq: Once | OROMUCOSAL | Status: AC

## 2024-12-23 MED ORDER — PHENYLEPHRINE 80 MCG/ML (10ML) SYRINGE FOR IV PUSH (FOR BLOOD PRESSURE SUPPORT)
PREFILLED_SYRINGE | INTRAVENOUS | Status: AC
  Filled 2024-12-23: qty 10

## 2024-12-23 MED ORDER — GABAPENTIN 250 MG/5ML PO SOLN
200.0000 mg | Freq: Every day | ORAL | Status: DC
  Filled 2024-12-23: qty 4

## 2024-12-23 MED ORDER — POTASSIUM CHLORIDE 10 MEQ/50ML IV SOLN
10.0000 meq | INTRAVENOUS | Status: AC
  Administered 2024-12-23 (×3): 10 meq via INTRAVENOUS

## 2024-12-23 MED ORDER — SODIUM CHLORIDE 0.9 % IV SOLN: INTRAVENOUS | Status: DC | PRN

## 2024-12-23 MED ORDER — ACETAMINOPHEN 160 MG/5ML PO SOLN
650.0000 mg | Freq: Once | ORAL | Status: AC
  Administered 2024-12-23: 650 mg
  Filled 2024-12-23: qty 20.3

## 2024-12-23 MED ORDER — ONDANSETRON HCL 4 MG/2ML IJ SOLN
4.0000 mg | Freq: Four times a day (QID) | INTRAMUSCULAR | Status: AC | PRN
  Administered 2024-12-28 – 2025-01-06 (×4): 4 mg via INTRAVENOUS
  Filled 2024-12-23 (×4): qty 2

## 2024-12-23 MED ORDER — FAMOTIDINE IN NACL 20-0.9 MG/50ML-% IV SOLN
20.0000 mg | Freq: Two times a day (BID) | INTRAVENOUS | Status: AC
  Administered 2024-12-23 (×2): 20 mg via INTRAVENOUS
  Filled 2024-12-23 (×2): qty 50

## 2024-12-23 MED ORDER — ONDANSETRON HCL 4 MG/2ML IJ SOLN
INTRAMUSCULAR | Status: AC
  Filled 2024-12-23: qty 2

## 2024-12-23 MED ORDER — LIDOCAINE 2% (20 MG/ML) 5 ML SYRINGE
INTRAMUSCULAR | Status: DC | PRN
  Administered 2024-12-23: 80 mg via INTRAVENOUS

## 2024-12-23 MED ORDER — ACETAMINOPHEN 500 MG PO TABS
1000.0000 mg | ORAL_TABLET | Freq: Four times a day (QID) | ORAL | Status: DC
  Administered 2024-12-24 – 2024-12-25 (×4): 1000 mg via ORAL
  Filled 2024-12-23 (×3): qty 2

## 2024-12-23 MED ORDER — EPINEPHRINE HCL 5 MG/250ML IV SOLN IN NS
0.0000 ug/min | INTRAVENOUS | Status: DC
  Administered 2024-12-24: 3 ug/min via INTRAVENOUS
  Filled 2024-12-23: qty 250

## 2024-12-23 MED ORDER — SODIUM CHLORIDE 0.9% FLUSH: 3.0000 mL | INTRAVENOUS | Status: AC | PRN

## 2024-12-23 MED ORDER — FLUCONAZOLE IN SODIUM CHLORIDE 400-0.9 MG/200ML-% IV SOLN
400.0000 mg | Freq: Once | INTRAVENOUS | Status: AC
  Administered 2024-12-24: 400 mg via INTRAVENOUS
  Filled 2024-12-23: qty 200

## 2024-12-23 MED ORDER — PANTOPRAZOLE SODIUM 40 MG PO TBEC
40.0000 mg | DELAYED_RELEASE_TABLET | Freq: Every day | ORAL | Status: DC
  Administered 2024-12-25: 40 mg via ORAL
  Filled 2024-12-23 (×2): qty 1

## 2024-12-23 MED ORDER — ROCURONIUM BROMIDE 10 MG/ML (PF) SYRINGE
PREFILLED_SYRINGE | INTRAVENOUS | Status: DC | PRN
  Administered 2024-12-23: 100 mg via INTRAVENOUS
  Administered 2024-12-23: 20 mg via INTRAVENOUS

## 2024-12-23 MED ORDER — SODIUM CHLORIDE 0.9 % IV SOLN
600.0000 mg | Freq: Once | INTRAVENOUS | Status: AC
  Administered 2024-12-24: 600 mg via INTRAVENOUS
  Filled 2024-12-23: qty 10

## 2024-12-23 MED ORDER — ONDANSETRON HCL 4 MG/2ML IJ SOLN
INTRAMUSCULAR | Status: DC | PRN
  Administered 2024-12-23: 4 mg via INTRAVENOUS

## 2024-12-23 MED ORDER — METOCLOPRAMIDE HCL 5 MG/ML IJ SOLN
10.0000 mg | Freq: Four times a day (QID) | INTRAMUSCULAR | Status: AC
  Administered 2024-12-23 – 2024-12-28 (×19): 10 mg via INTRAVENOUS
  Filled 2024-12-23 (×19): qty 2

## 2024-12-23 MED ORDER — ALBUMIN HUMAN 5 % IV SOLN: INTRAVENOUS | Status: DC | PRN

## 2024-12-23 MED ORDER — PROTAMINE SULFATE 10 MG/ML IV SOLN
INTRAVENOUS | Status: DC | PRN
  Administered 2024-12-23 (×3): 50 mg via INTRAVENOUS
  Administered 2024-12-23: 10 mg via INTRAVENOUS
  Administered 2024-12-23: 40 mg via INTRAVENOUS

## 2024-12-23 MED ORDER — MORPHINE SULFATE (PF) 2 MG/ML IV SOLN
1.0000 mg | INTRAVENOUS | Status: DC | PRN
  Administered 2024-12-23 (×2): 4 mg via INTRAVENOUS
  Filled 2024-12-23 (×2): qty 2

## 2024-12-23 MED ORDER — MIDAZOLAM HCL (PF) 10 MG/2ML IJ SOLN
INTRAMUSCULAR | Status: AC
  Filled 2024-12-23: qty 2

## 2024-12-23 MED ORDER — LACTATED RINGERS IV SOLN: INTRAVENOUS | Status: DC

## 2024-12-23 MED ORDER — THROMBIN 20000 UNITS EX SOLR
OROMUCOSAL | Status: DC | PRN
  Administered 2024-12-23 (×2): 4 mL via TOPICAL

## 2024-12-23 MED ORDER — POLYETHYLENE GLYCOL 3350 17 G PO PACK
17.0000 g | PACK | Freq: Every day | ORAL | Status: DC
  Administered 2024-12-23 – 2024-12-25 (×3): 17 g
  Filled 2024-12-23 (×3): qty 1

## 2024-12-23 MED ORDER — GABAPENTIN 100 MG PO CAPS
200.0000 mg | ORAL_CAPSULE | Freq: Every day | ORAL | Status: DC
  Administered 2024-12-23 – 2024-12-25 (×2): 200 mg via ORAL
  Filled 2024-12-23 (×2): qty 2

## 2024-12-23 MED ORDER — FENTANYL 2500MCG IN NS 250ML (10MCG/ML) PREMIX INFUSION
0.0000 ug/h | INTRAVENOUS | Status: DC
  Filled 2024-12-23: qty 250

## 2024-12-23 MED ORDER — FENTANYL CITRATE (PF) 250 MCG/5ML IJ SOLN
INTRAMUSCULAR | Status: DC | PRN
  Administered 2024-12-23 (×4): 50 ug via INTRAVENOUS
  Administered 2024-12-23: 25 ug via INTRAVENOUS
  Administered 2024-12-23: 50 ug via INTRAVENOUS
  Administered 2024-12-23: 25 ug via INTRAVENOUS
  Administered 2024-12-23 (×2): 50 ug via INTRAVENOUS
  Administered 2024-12-23: 100 ug via INTRAVENOUS

## 2024-12-23 MED ORDER — HEPARIN SODIUM (PORCINE) 1000 UNIT/ML IJ SOLN
INTRAMUSCULAR | Status: DC | PRN
  Administered 2024-12-23: 5000 [IU] via INTRAVENOUS
  Administered 2024-12-23: 26000 [IU] via INTRAVENOUS

## 2024-12-23 MED ORDER — BISACODYL 10 MG RE SUPP: 10.0000 mg | Freq: Every day | RECTAL | Status: DC

## 2024-12-23 MED ORDER — ORAL CARE MOUTH RINSE: 15.0000 mL | Freq: Once | OROMUCOSAL | Status: AC

## 2024-12-23 MED ORDER — SENNA 8.6 MG PO TABS
1.0000 | ORAL_TABLET | Freq: Two times a day (BID) | ORAL | Status: DC
  Administered 2024-12-23 – 2024-12-25 (×4): 8.6 mg
  Filled 2024-12-23 (×4): qty 1

## 2024-12-23 MED ORDER — HEPARIN SODIUM (PORCINE) 1000 UNIT/ML IJ SOLN
INTRAMUSCULAR | Status: AC
  Filled 2024-12-23: qty 1

## 2024-12-23 MED ORDER — PROTAMINE SULFATE 10 MG/ML IV SOLN
INTRAVENOUS | Status: AC
  Filled 2024-12-23: qty 25

## 2024-12-23 MED ORDER — THROMBIN (RECOMBINANT) 20000 UNITS EX SOLR
CUTANEOUS | Status: AC
  Filled 2024-12-23: qty 20000

## 2024-12-23 MED ORDER — FENTANYL CITRATE (PF) 50 MCG/ML IJ SOSY: 25.0000 ug | PREFILLED_SYRINGE | Freq: Once | INTRAMUSCULAR | Status: DC

## 2024-12-23 MED FILL — Calcium Chloride Inj 10%: INTRAVENOUS | Qty: 10 | Status: AC

## 2024-12-23 NOTE — Anesthesia Procedure Notes (Signed)
 Central Venous Catheter Insertion Performed by: Leopoldo Bruckner, MD, anesthesiologist Start/End1/23/2026 7:18 AM, 12/23/2024 7:28 AM Patient location: Pre-op. Preanesthetic checklist: patient identified, IV checked, site marked, risks and benefits discussed, surgical consent, monitors and equipment checked, pre-op evaluation, timeout performed and anesthesia consent Hand hygiene performed  and maximum sterile barriers used  PA cath was placed.Swan type:thermodilution and oximetry Attempts: 1 Patient tolerated the procedure well with no immediate complications.

## 2024-12-23 NOTE — Interval H&P Note (Signed)
 History and Physical Interval Note:  12/23/2024 6:38 AM  Karen Warner Karen Warner  has presented today for surgery, with the diagnosis of Heart failure.  The various methods of treatment have been discussed with the patient and family. After consideration of risks, benefits and other options for treatment, the patient has consented to  Procedures: INSERTION OF IMPLANTABLE LEFT VENTRICULAR ASSIST DEVICE (N/A) ECHOCARDIOGRAM, TRANSESOPHAGEAL, INTRAOPERATIVE (N/A) as a surgical intervention.  The patient's history has been reviewed, patient examined, no change in status, stable for surgery.  I have reviewed the patient's chart and labs.  Questions were answered to the patient's satisfaction.     Karen Warner Karen Warner

## 2024-12-23 NOTE — Progress Notes (Signed)
" ° °  Called by CCM for VAD Alarm   CI 2. Epi increased to 3 on Norepi 3 mcg.   Speed 4600 Flow < 2. Map 90s Give 2 albumin.   Cut back Norepi 0.5 mcg. Maps down mid 60s. Increased Norepi to 1 mcg.   Map stable low  70s. VAD Flow improved 3.8.   Discussed with VAD Coordinator and CCM.   Kaysia Willard NP-C  5:01 PM       "

## 2024-12-23 NOTE — Anesthesia Procedure Notes (Signed)
 Arterial Line Insertion Start/End1/23/2026 7:00 AM, 12/23/2024 7:10 AM Performed by: Leopoldo Wanda DASEN, CRNA, CRNA  Patient location: Pre-op. Preanesthetic checklist: patient identified, IV checked, site marked, risks and benefits discussed, surgical consent, monitors and equipment checked, pre-op evaluation, timeout performed and anesthesia consent Lidocaine  1% used for infiltration Left, radial was placed Catheter size: 20 G Maximum sterile barriers used   Attempts: 1 Following insertion, dressing applied and Biopatch. Post procedure assessment: normal  Patient tolerated the procedure well with no immediate complications.

## 2024-12-23 NOTE — Progress Notes (Signed)
 Outpatient Heart Failure CSW following during LVAD implant hospital stay.  Per LVAD Coordinator no CSW needs expressed by family at this time.  CSW will check in with patient and family next week and assist as needed.  Andriette HILARIO Leech, LCSW Clinical Social Worker Advanced Heart Failure Clinic Desk#: [phone] Cell#: [phone]

## 2024-12-23 NOTE — Progress Notes (Addendum)
 IMTS Cross Cover Note  Received page from RN about chest pain. No other associated symptoms. Tenderness on palpation of chest wall area. Previously had chest pain on admit. Vitals are stable and unchanged from prior. EKG showed paced rhythm similar compared to previous EKGs. Received nitroglycerin  SL. Pain subsiding while at bedside. A&Ox3. Has had anginal pain but also chronic hx of fibromyalgia.    Vitals BP 126/66 HR 105 RR 18 Afebrile SpO2 98% on RA  - Trend troponins - If chest pain returns/worsens, can consider repeat EKG and reach out to cardiology  - Already has PRN nitroglycerin  SL - Has other medications for MSK related pains  - Currently NPO for scheduled LVAD placement tomorrow  Please contact the on-call pager at [phone] or [phone]

## 2024-12-23 NOTE — Progress Notes (Signed)
 Pt had sudden chest pain and pain score of a 9. MD on call was notified EKG was done and pt received nitroglycerin .

## 2024-12-23 NOTE — Consult Note (Signed)
 "  NAME:  Karen Warner, MRN:  [id number], DOB:  22-Apr-1951, LOS: 8 ADMISSION DATE:  12/14/2024, CONSULTATION DATE:  1/23 REFERRING MD:  Lucas, CHIEF COMPLAINT:  post-LVAD   History of Present Illness:  [name] is a 74 y/o woman with a history of ICM and HFrEF who presented on 1/14 with chest pain and decompensated heart failure. She underwent left and right heart catheterization this admission showing low CO and low filling pressures.  She was managed with inotropic support. Due to progression of disease and poor tolerance of GDMT, she was evaluated for LVAD.  Intraoperatively her course was uncomplicated other than low flows on LVAD when her BP went up at the end of the case when she was stimulated. She transferred to the ICU on epi 2, NE 1, milrinone  0.332mcg. TEE with normal RV function, evidence of intrapulmonary shunt.  Paralytics not reversed in ICU. Bypass time , crossclamp time 29 min, cell saver 225cc. LVAD speed 4600 RPM. Alarms on LVAD went off during periods of hypertension post-op, controlled by afterload reduction.   Pertinent  Medical History  HFrEF due to ICM DM2 IDA FM CKD3  Significant Hospital Events: Including procedures, antibiotic start and stop dates in addition to other pertinent events   1/14 admission 1/15 L&R heart cath> nonobstructive disease, low filling pressures 1/23 LVAD  Interim History / Subjective:    Objective    Blood pressure 122/79, pulse 100, temperature 98 F (36.7 C), temperature source Oral, resp. rate 20, height 5' 5 (1.651 m), weight 73.8 kg, SpO2 99%. CVP:  [6 mmHg-13 mmHg] 13 mmHg      Intake/Output Summary (Last 24 hours) at 12/23/2024 1148 Last data filed at 12/23/2024 1147 Gross per 24 hour  Intake 3147 ml  Output 835 ml  Net 2312 ml   Filed Weights   12/22/24 0156 12/23/24 0107 12/23/24 0634  Weight: 73.8 kg 73.8 kg 73.8 kg    Examination: General: critically ill appearing woman lying in bed intubated,  sedated HENT: [place]/AT, eyes anicteric, ETT Lungs: breathing comfortably on MV, CTAB Cardiovascular: mechanical hum of LVAD Abdomen: soft, NT Extremities: no cyanosis or edema Neuro: RASS -2, able to follow simple commands, coughing GU: foley with clear yellow urine  PFTS: FEV1 1.66 (74%) FVC 1.9L (63%) Ratio 87% TLC 3.71L (71%) Mild restriction, no obstruction.  7.33/37/204/20 WBC 14.7 H/H 8.9/26 Platelets 136  CXR personally reviewed> ETT 3 cm above carina. Barostim on R, AICD on left. RIJ Swan. Chest tubes. Central pulmonary edema.    Resolved problem list   Assessment and Plan   Acute on chronic HFrEF due to ICM; NYHA class IV heart failure s/p LVAD on 1/23 -post-op care per TCTS & AHF -complete post op antimicrobials -pain control per protocol- tramadol, oxycodone, morphine  changed to fentanyl  with baseline renal disease -aspirin , statin daily -con't inotropic support per AHF-- milrinone . Epi increased to 3mcg to maintain CI >2. NE titrated for goal MAP 70-80. -Cleviprex could be added if afterload is uncontrolled; LVAD flows drop with MAP >~85. Low threshold for volume overnight with LVAD alarms.   Post-op vent management; PFTs with minimal pulmonary restriction likely not clinically significant. -keep intubated overnight, hopeful for extubation tomorrow morning -VAP prevention protocol -pulmonary hygiene -PAD protocol; goal RASS 0 to -1. Added fentanyl  for overnight in addition to precedex  if needed.  Hyperglycemia; h/o DM. A1c 6.6 -insulin  gtt per protocol -hold PTA SSI insulin  regimen (BID dosing) -goal BG <180 -hold PTA farxiga   GERD -con't  PTA H2blocker -PPI post-op  H/o migraine -family brought in PTA Ubrelvy   CKD 3b -strict I/O -renally dose meds, avoid nephrotoxic meds -avoid morphine , switched to fentanyl     Labs   CBC: Recent Labs  Lab 12/19/24 0349 12/20/24 0428 12/23/24 0302 12/23/24 0754 12/23/24 1015 12/23/24 1032 12/23/24 1057  12/23/24 1115 12/23/24 1118  WBC 13.7* 12.9* 10.0  --   --   --   --   --   --   HGB 11.5* 10.7* 11.6*   < > 7.6* 7.5* 7.8* 8.2* 9.9*  HCT 35.1* 32.5* 35.5*   < > 22.5* 22.0* 23.0* 24.0* 29.0*  MCV 97.0 97.0 97.3  --   --   --   --   --   --   PLT 279 290 283  --  174  --   --   --   --    < > = values in this interval not displayed.    Basic Metabolic Panel: Recent Labs  Lab 12/20/24 0428 12/21/24 0413 12/22/24 0456 12/23/24 0223 12/23/24 0601 12/23/24 0754 12/23/24 0805 12/23/24 9191 12/23/24 0903 12/23/24 0924 12/23/24 0958 12/23/24 1032 12/23/24 1057 12/23/24 1115 12/23/24 1118  NA 135 135 134* 134* 137   < > 141   < > 137   < > 138 136 138 139 138  K 4.4 4.5 4.7 4.4 3.7   < > 4.1   < > 4.4   < > 4.9 4.7 4.6 4.4 4.4  CL 104 103 103 101 111  --  107  --  105  --  99  --  99 100  --   CO2 22 23 22 22  17*  --   --   --   --   --   --   --   --   --   --   GLUCOSE 141* 129* 171* 182* 183*  --  136*  --  147*  --  149*  --  167* 168*  --   BUN 14 15 16 16 13   --  15  --  14  --  12  --  14 14  --   CREATININE 1.55* 1.56* 1.56* 1.75* 1.36*  --  1.50*  --  1.50*  --  1.40*  --  1.40* 1.30*  --   CALCIUM  9.3 9.3 9.6 9.6 7.5*  --   --   --   --   --   --   --   --   --   --   MG 2.5*  --   --  2.4  --   --   --   --   --   --   --   --   --   --   --    < > = values in this interval not displayed.   GFR: Estimated Creatinine Clearance: 38.2 mL/min (A) (by C-G formula based on SCr of 1.3 mg/dL (H)). Recent Labs  Lab 12/19/24 0349 12/19/24 1831 12/19/24 1938 12/20/24 0428 12/23/24 0302  WBC 13.7*  --   --  12.9* 10.0  LATICACIDVEN  --  0.8 0.9  --   --     Liver Function Tests: Recent Labs  Lab 12/17/24 0514 12/19/24 1832 12/23/24 0223  AST 16 18 19   ALT 7 8 8   ALKPHOS 62 54 62  BILITOT 0.4 0.3 0.3  PROT 6.8 6.2* 7.3  ALBUMIN 3.7 3.3* 4.0   No results for  input(s): LIPASE, AMYLASE in the last 168 hours. No results for input(s): AMMONIA in the last 168  hours.  ABG    Component Value Date/Time   PHART 7.483 (H) 12/23/2024 1118   PCO2ART 31.8 (L) 12/23/2024 1118   PO2ART 369 (H) 12/23/2024 1118   HCO3 23.9 12/23/2024 1118   TCO2 25 12/23/2024 1118   ACIDBASEDEF 2.0 12/23/2024 0930   O2SAT 100 12/23/2024 1118     Coagulation Profile: Recent Labs  Lab 12/16/24 1449 12/22/24 1837  INR 1.1 1.0    Cardiac Enzymes: No results for input(s): CKTOTAL, CKMB, CKMBINDEX, TROPONINI in the last 168 hours.  HbA1C: Hgb A1c MFr Bld  Date/Time Value Ref Range Status  12/19/2024 03:49 AM 6.6 (H) 4.8 - 5.6 % Final    Comment:    (NOTE) Diagnosis of Diabetes The following HbA1c ranges recommended by the American Diabetes Association (ADA) may be used as an aid in the diagnosis of diabetes mellitus.  Hemoglobin             Suggested A1C NGSP%              Diagnosis  <5.7                   Non Diabetic  5.7-6.4                Pre-Diabetic  >6.4                   Diabetic  <7.0                   Glycemic control for                       adults with diabetes.    01/11/2024 02:28 PM 7.7 (H) 4.8 - 5.6 % Final    Comment:    (NOTE) Pre diabetes:          5.7%-6.4%  Diabetes:              >6.4%  Glycemic control for   <7.0% adults with diabetes     CBG: Recent Labs  Lab 12/22/24 1117 12/22/24 1534 12/22/24 2103 12/23/24 0215 12/23/24 0530  GLUCAP 244* 167* 156* 148* 180*    Review of Systems:   Unable to be obtained due to mental status.  Past Medical History:  She,  has a past medical history of AICD (automatic cardioverter/defibrillator) present, Anemia (12/12/2019), Anginal pain, Arthralgia (01/14/2020), Arthritis, Asthma, Atherosclerotic heart disease of native coronary artery without angina pectoris (04/13/2014), CHF (congestive heart failure) (HCC), Chronic bilateral low back pain with bilateral sciatica (01/13/2019), Chronic bladder pain (11/08/2014), Chronic headaches, Chronic idiopathic constipation  (12/22/2014), Chronic obstructive pulmonary disease, unspecified (HCC) (03/18/2018), CKD (chronic kidney disease) stage 3, GFR 30-59 ml/min (HCC), Class 1 obesity due to excess calories with serious comorbidity and body mass index (BMI) of 31.0 to 31.9 in adult (06/26/2020), Colon polyps, COPD (chronic obstructive pulmonary disease) (HCC), Coronary artery disease, DDD (degenerative disc disease), cervical (01/13/2019), Depression (11/05/2019), Diabetic polyneuropathy associated with type 2 diabetes mellitus (HCC) (02/24/2019), Diverticulitis (05/20/2018), Diverticulitis of colon (03/18/2018), Family history of ischemic heart disease (IHD) (08/16/2013), Fibromyalgia, Fibromyalgia affecting multiple sites (01/14/2020), Gastro-esophageal reflux disease without esophagitis (01/13/2019), Glaucoma (11/08/2014), Hordeolum externum of left upper eyelid (03/13/2020), Hypertension (11/08/2014), IBS (irritable bowel syndrome), Iron deficiency anemia (01/13/2019), Left renal mass (11/08/2019), Leukocytosis (05/28/2018), Low vitamin B12 level (03/13/2020), Low vitamin D  level (12/12/2019), LV dysfunction (05/31/2019), 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   Surgical History:   Past Surgical History:  Procedure Laterality Date   ABDOMINAL HYSTERECTOMY     BIV ICD INSERTION CRT-D N/A 12/31/2020   Procedure: BIV ICD INSERTION CRT-D;  Surgeon: Waddell Danelle ORN, MD;  Location: [facility] INVASIVE  CV LAB;  Service: Cardiovascular;  Laterality: N/A;   BLEPHAROPLASTY Bilateral    COLON SURGERY     CORONARY ANGIOPLASTY WITH STENT PLACEMENT  2020   X3   CORONARY PRESSURE/FFR STUDY N/A 08/03/2020   Procedure: INTRAVASCULAR PRESSURE WIRE/FFR STUDY;  Surgeon: Wonda Sharper, MD;  Location: [facility] INVASIVE CV LAB;  Service: Cardiovascular;  Laterality: N/A;   CORONARY STENT INTERVENTION N/A 08/03/2020   Procedure: CORONARY STENT INTERVENTION;  Surgeon: Wonda Sharper, MD;  Location: [facility] INVASIVE CV LAB;  Service: Cardiovascular;  Laterality: N/A;   LEFT HEART CATH AND CORONARY ANGIOGRAPHY N/A 08/03/2020   Procedure: LEFT HEART CATH AND CORONARY ANGIOGRAPHY;  Surgeon: Wonda Sharper, MD;  Location: [facility] INVASIVE CV LAB;  Service: Cardiovascular;  Laterality: N/A;   RADIOFREQUENCY ABLATION  07/2023   REFRACTIVE SURGERY Left    [place]    RIGHT/LEFT HEART CATH AND CORONARY ANGIOGRAPHY N/A 10/13/2023   Procedure: RIGHT/LEFT HEART CATH AND CORONARY ANGIOGRAPHY;  Surgeon: Rolan Ezra RAMAN, MD;  Location: [facility] INVASIVE CV LAB;  Service: Cardiovascular;  Laterality: N/A;   RIGHT/LEFT HEART CATH AND CORONARY ANGIOGRAPHY N/A 12/15/2024   Procedure: RIGHT/LEFT HEART CATH AND CORONARY ANGIOGRAPHY;  Surgeon: Rolan Ezra RAMAN, MD;  Location: [facility] INVASIVE CV LAB;  Service: Cardiovascular;  Laterality: N/A;     Social History:   reports that she quit smoking about 5 years ago. Her smoking use included cigarettes. She has never used smokeless tobacco. She reports that she does not drink alcohol and does not use drugs.   Family History:  Her family history includes Colon polyps in her mother; Coronary artery disease in her mother; Diabetes in her daughter, maternal grandmother, and mother; Glaucoma in her mother; Heart disease in her father; Hypertension in her brother and mother; Lung cancer in her mother; Migraines in her father and son.   Allergies Allergies[1]   Home Medications  Prior to Admission  medications  Medication Sig Start Date End Date Taking? Authorizing Provider  acetaminophen  (TYLENOL ) 650 MG CR tablet Take 650 mg by mouth every 8 (eight) hours as needed for pain.   Yes [provider]  acetaminophen -codeine (TYLENOL  #3) 300-30 MG tablet Take 1 tablet by mouth every 6 (six) hours as needed for moderate pain (pain score 4-6).   Yes [provider]  albuterol  (PROVENTIL ) (2.5 MG/3ML) 0.083% nebulizer solution Take 2.5 mg by nebulization every 4 (four) hours as needed for wheezing or shortness of breath. 01/26/18  Yes [provider]  aspirin  81 MG EC tablet Take 81 mg by mouth daily.   Yes [provider]  carvedilol  (COREG ) 3.125 MG tablet Take 1 tablet (3.125 mg total) by mouth 2 (two) times daily with a meal. 09/01/24  Yes Tobb, Kardie, DO  clopidogrel  (PLAVIX ) 75 MG tablet Take 1 tablet (75 mg total) by mouth daily. TAKE 1 TABLET(75 MG) BY MOUTH DAILY  07/07/24  Yes Lesia Ozell Barter, PA-C  cyclobenzaprine  (FLEXERIL ) 5 MG tablet Take 5 mg by mouth 3 (three) times daily as needed for muscle spasms. 11/04/24  Yes [provider]  dapagliflozin  propanediol (FARXIGA ) 10 MG TABS tablet TAKE 1 TABLET(10 MG) BY MOUTH DAILY BEFORE BREAKFAST 07/01/23  Yes Tobb, Kardie, DO  dicyclomine  (BENTYL ) 10 MG capsule TAKE 1 CAPSULE(10 MG) BY MOUTH IN THE MORNING AND AT BEDTIME 11/16/24  Yes Nandigam, Kavitha V, MD  digoxin  (LANOXIN ) 0.125 MG tablet TAKE ONE-HALF TABLET BY MOUTH EVERY DAY 06/23/24  Yes Rolan Ezra RAMAN, MD  ezetimibe  (ZETIA ) 10 MG tablet Take 1 tablet (10 mg total) by mouth daily. 05/25/24 09/01/25 Yes Rolan Ezra RAMAN, MD  famotidine  (PEPCID ) 40 MG tablet TAKE 1 TABLET(40 MG) BY MOUTH TWICE DAILY Patient taking differently: Take 40 mg by mouth daily. 02/06/23  Yes Beather Delon Gibson, PA  folic acid  (FOLVITE ) 1 MG tablet Take 1 mg by mouth daily. 05/29/21  Yes [provider]  insulin  lispro (HUMALOG) 100 UNIT/ML KwikPen Inject 2-20  Units into the skin in the morning and at bedtime. per sliding scale 09/22/16  Yes [provider]  isosorbide  mononitrate (IMDUR ) 30 MG 24 hr tablet Take 15 mg by mouth daily. 05/30/24  Yes [provider]  lidocaine  (LIDODERM ) 5 % Place 1 patch onto the skin every 12 (twelve) hours as needed (pain). Remove & Discard patch within 12 hours or as directed by MD   Yes [provider]  loratadine  (CLARITIN ) 10 MG tablet Take 10 mg by mouth daily. 09/19/20  Yes [provider]  losartan  (COZAAR ) 25 MG tablet Take 0.5 tablets (12.5 mg total) by mouth daily. 05/04/24 09/01/25 Yes Milford, Harlene HERO, FNP  nitroGLYCERIN  (NITROSTAT ) 0.4 MG SL tablet DISSOLVE 1 TABLET UNDER TONGUE EVERY 5 MINUTES AS NEEDED FOR CHEST PAIN. 08/24/23  Yes Tobb, Kardie, DO  pantoprazole  (PROTONIX ) 40 MG tablet Take 1 tablet (40 mg total) by mouth daily. TAKE 1 TABLET(40 MG) BY MOUTH DAILY 07/07/24  Yes Lesia Ozell Barter, PA-C  Plecanatide  (TRULANCE ) 3 MG TABS Take 1 tablet (3 mg total) by mouth as needed. 11/09/24  Yes Beather Delon Gibson, PA  ranolazine  (RANEXA ) 500 MG 12 hr tablet Take 1 tablet (500 mg total) by mouth 2 (two) times daily. 05/11/24  Yes Tobb, Kardie, DO  rosuvastatin  (CRESTOR ) 40 MG tablet Take 40 mg by mouth at bedtime.   Yes [provider]  Semaglutide (OZEMPIC, 1 MG/DOSE, [place]) Inject 1 mg into the skin every 7 (seven) days.   Yes [provider]  spironolactone  (ALDACTONE ) 25 MG tablet Take 1 tablet (25 mg total) by mouth daily. 09/01/24  Yes Rolan Ezra RAMAN, MD  torsemide  (DEMADEX ) 20 MG tablet Take 1 tablet (20 mg total) by mouth 2 (two) times daily. 12/13/24  Yes Milford, Jessica M, FNP  TRESIBA FLEXTOUCH 200 UNIT/ML FlexTouch Pen Inject 26 Units into the skin 2 (two) times daily. 06/29/20  Yes [provider]  TRINTELLIX  5 MG TABS tablet Take 5 mg by mouth at bedtime. 08/14/20  Yes [provider]  Ubrogepant  (UBRELVY ) 50 MG TABS Take 1  tablet by mouth daily as needed (headache).   Yes [provider]  valACYclovir  (VALTREX ) 500 MG tablet Take 500 mg by mouth daily. 04/10/23  Yes [provider]  vitamin B-12 (CYANOCOBALAMIN ) 1000 MCG tablet Take 1,000 mcg by mouth daily.   Yes [provider]  Vitamin D , Ergocalciferol , (DRISDOL ) 1.25 MG (50000 UNIT)  CAPS capsule Take 50,000 Units by mouth every 7 (seven) days. sunday   Yes [provider]  Continuous Glucose Sensor (DEXCOM G7 SENSOR) MISC SMARTSIG:1 Topical Every 10 Days 03/12/23   [provider]  potassium chloride  SA (KLOR-CON  M) 20 MEQ tablet Take 1 tablet (20 mEq total) by mouth daily. Patient not taking: Reported on 12/14/2024 12/13/24   Glena Harlene HERO, FNP     Critical care time: 44 min.      Leita [name], DO 12/23/24 8:20 PM [facility]  For contact information, see Amion. If no response to pager, please call PCCM 2H APP. After hours, 7PM- 7AM, please call on call APP for 2H.         [1]  Allergies Allergen Reactions   Bee Venom Anaphylaxis   Sumatriptan Shortness Of Breath    Migraine worsened   Amoxicillin-Pot Clavulanate Diarrhea and Nausea And Vomiting    GI Intolerance   Oxycodone Itching and Hives    Other reaction(s): Other (see comments) Funny feeling in head  off the edge    Buprenorphine Hcl     Other reaction(s): Itching   Clarithromycin Diarrhea    Abdominal pain   Duloxetine  Hcl Other (See Comments)    drowsiness   Hydrocodone Itching   Lactose Intolerance (Gi) Other (See Comments)    Upset stomach, gas/bloating   Liraglutide Other (See Comments)    Abdominal discomfort   Tramadol Hcl Itching   "

## 2024-12-23 NOTE — Anesthesia Preprocedure Evaluation (Signed)
"                                    Anesthesia Evaluation    Airway Mallampati: III  TM Distance: <3 FB Neck ROM: Full    Dental  (+) Teeth Intact, Chipped, Dental Advisory Given,    Pulmonary former smoker   breath sounds clear to auscultation       Cardiovascular hypertension,  Rhythm:Regular Rate:Tachycardia     Neuro/Psych    GI/Hepatic   Endo/Other  diabetes    Renal/GU      Musculoskeletal   Abdominal   Peds  Hematology   Anesthesia Other Findings   Reproductive/Obstetrics                              Anesthesia Physical Anesthesia Plan  ASA: 4  Anesthesia Plan: General   Post-op Pain Management:    Induction: Intravenous  PONV Risk Score and Plan: 4 or greater and Ondansetron   Airway Management Planned: Oral ETT  Additional Equipment: Arterial line, CVP, PA Cath, TEE and Ultrasound Guidance Line Placement  Intra-op Plan:   Post-operative Plan: Post-operative intubation/ventilation  Informed Consent: I have reviewed the patients History and Physical, chart, labs and discussed the procedure including the risks, benefits and alternatives for the proposed anesthesia with the patient or authorized representative who has indicated his/her understanding and acceptance.     Dental advisory given  Plan Discussed with: CRNA  Anesthesia Plan Comments:         Anesthesia Quick Evaluation  "

## 2024-12-23 NOTE — Plan of Care (Signed)
" °  Problem: Health Behavior/Discharge Planning: Goal: Ability to manage health-related needs will improve Outcome: Progressing   Problem: Metabolic: Goal: Ability to maintain appropriate glucose levels will improve Outcome: Progressing   Problem: Cardiovascular: Goal: Ability to achieve and maintain adequate cardiovascular perfusion will improve Outcome: Progressing   Problem: Education: Goal: Knowledge of General Education information will improve Description: Including pain rating scale, medication(s)/side effects and non-pharmacologic comfort measures Outcome: Progressing   Problem: Health Behavior/Discharge Planning: Goal: Ability to manage health-related needs will improve Outcome: Progressing   Problem: Clinical Measurements: Goal: Respiratory complications will improve Outcome: Progressing Goal: Cardiovascular complication will be avoided Outcome: Progressing   "

## 2024-12-23 NOTE — Progress Notes (Signed)
 Pharmacy Antibiotic Note  Karen Warner is a 74 y.o. female admitted on 12/14/2024 with CHF s/p LVAD implant 1/23. Pharmacy has been consulted for vancomycin  dosing for 48h surgical ppx.  Plan: Vancomycin  1000mg  IV q12h x3  Height: 5' 5 (165.1 cm) Weight: 73.8 kg (162 lb 11.2 oz) IBW/kg (Calculated) : 57  Temp (24hrs), Avg:97.8 F (36.6 C), Min:97.6 F (36.4 C), Max:98 F (36.7 C)  Recent Labs  Lab 12/19/24 0349 12/19/24 1831 12/19/24 1832 12/19/24 1938 12/20/24 0428 12/21/24 0413 12/23/24 0302 12/23/24 0601 12/23/24 0805 12/23/24 0903 12/23/24 0958 12/23/24 1057 12/23/24 1115  WBC 13.7*  --   --   --  12.9*  --  10.0  --   --   --   --   --   --   CREATININE 1.54*  --    < >  --  1.55*   < >  --    < > 1.50* 1.50* 1.40* 1.40* 1.30*  LATICACIDVEN  --  0.8  --  0.9  --   --   --   --   --   --   --   --   --    < > = values in this interval not displayed.    Estimated Creatinine Clearance: 38.2 mL/min (A) (by C-G formula based on SCr of 1.3 mg/dL (H)).    Allergies[1]    Ozell Jamaica, PharmD, BCPS, [facility] Clinical Pharmacist [phone] Please check AMION for all [facility] Pharmacy numbers 12/23/2024     [1]  Allergies Allergen Reactions   Bee Venom Anaphylaxis   Sumatriptan Shortness Of Breath    Migraine worsened   Amoxicillin-Pot Clavulanate Diarrhea and Nausea And Vomiting    GI Intolerance   Oxycodone  Itching and Hives    Other reaction(s): Other (see comments) Funny feeling in head  off the edge    Buprenorphine Hcl     Other reaction(s): Itching   Clarithromycin Diarrhea    Abdominal pain   Duloxetine  Hcl Other (See Comments)    drowsiness   Hydrocodone Itching   Lactose Intolerance (Gi) Other (See Comments)    Upset stomach, gas/bloating   Liraglutide Other (See Comments)    Abdominal discomfort   Tramadol Hcl Itching

## 2024-12-23 NOTE — Brief Op Note (Signed)
 BRIEF OP NOTE CARDIOTHORACIC SURGERY  12/23/2024 11:08 AM  PATIENT:  [name]  74 y.o. female  PRE-OPERATIVE DIAGNOSIS:  Heart failure  POST-OPERATIVE DIAGNOSIS:  Heart failure  PROCEDURES:  INSERTION OF IMPLANTABLE LEFT VENTRICULAR ASSIST DEVICE--HEARTMATE 3  ECHOCARDIOGRAM, TRANSESOPHAGEAL, INTRAOPERATIVE   SURGEON:  Bartle, Dorise POUR, MD   PHYSICIAN ASSISTANT: Emmagene Ortner  ASSISTANTS: Gershon Larve, RN, Scrub Person         Lawernce Suzen LABOR, RN, RN First Assist   ANESTHESIA:   general  EBL:   BLOOD ADMINISTERED:  FFP x 1 unit  DRAINS: bilateral pleural and mediastinal drains x 2   LOCAL MEDICATIONS USED:  NONE  SPECIMEN:  Myocardium, left apex  DISPOSITION OF SPECIMEN:  PATHOLOGY  COUNTS:  Correct  DICTATION: .Dragon Dictation  PLAN OF CARE: Admit to inpatient   PATIENT DISPOSITION:  ICU - intubated and hemodynamically stable.   Delay start of Pharmacological VTE agent (>24hrs) due to surgical blood loss or risk of bleeding: yes  M. Lindia, PA-C

## 2024-12-23 NOTE — Anesthesia Procedure Notes (Signed)
 Central Venous Catheter Insertion Performed by: Leopoldo Bruckner, MD, anesthesiologist Start/End1/23/2026 7:11 AM, 12/23/2024 7:28 AM Patient location: Pre-op. Preanesthetic checklist: patient identified, IV checked, site marked, risks and benefits discussed, surgical consent, monitors and equipment checked, pre-op evaluation, timeout performed and anesthesia consent Lidocaine  1% used for infiltration and patient sedated Hand hygiene performed  and maximum sterile barriers used  Catheter size: 9 Fr Total catheter length 10. MAC introducer Procedure performed using ultrasound to evaluate access site. Ultrasound Notes:relevant anatomy identified, ultrasound used to visualize needle entry, vessel patent under ultrasound and image(s) printed for medical record. Attempts: 1 Following insertion, line sutured and dressing applied. Post procedure assessment: blood return through all ports, free fluid flow and no air  Patient tolerated the procedure well with no immediate complications.

## 2024-12-23 NOTE — Transfer of Care (Signed)
 Immediate Anesthesia Transfer of Care Note  Patient: Karen Warner  Procedure(s) Performed: INSERTION OF IMPLANTABLE LEFT VENTRICULAR ASSIST DEVICE HEARTMATE 3 (Chest) ECHOCARDIOGRAM, TRANSESOPHAGEAL, INTRAOPERATIVE  Patient Location: SICU  Anesthesia Type:General  Level of Consciousness: sedated and Patient remains intubated per anesthesia plan  Airway & Oxygen Therapy: Patient remains intubated per anesthesia plan and Patient placed on Ventilator (see vital sign flow sheet for setting)  Post-op Assessment: Report given to RN and Post -op Vital signs reviewed and stable  Post vital signs: Reviewed and stable  Last Vitals:  Vitals Value Taken Time  BP [phone] 1304  Temp    Pulse 30 12/23/24 13:04  Resp 17 12/23/24 13:04  SpO2 100 % 12/23/24 13:04  Vitals shown include unfiled device data.  Last Pain:  Vitals:   12/23/24 0644  TempSrc: Oral  PainSc: 0-No pain      Patients Stated Pain Goal: 0 (12/22/24 2300)  Complications: No notable events documented. Bedside report to RN. VSS

## 2024-12-23 NOTE — Anesthesia Postprocedure Evaluation (Signed)
"   Anesthesia Post Note  Patient: Karen Warner  Procedure(s) Performed: INSERTION OF IMPLANTABLE LEFT VENTRICULAR ASSIST DEVICE HEARTMATE 3 (Chest) ECHOCARDIOGRAM, TRANSESOPHAGEAL, INTRAOPERATIVE     Patient location during evaluation: SICU Anesthesia Type: General Level of consciousness: sedated Pain management: pain level controlled Vital Signs Assessment: post-procedure vital signs reviewed and stable Respiratory status: patient remains intubated per anesthesia plan Cardiovascular status: stable Postop Assessment: no apparent nausea or vomiting Anesthetic complications: no   No notable events documented.                  Genny Caulder      "

## 2024-12-23 NOTE — Progress Notes (Signed)
" °  TCTS Evening Rounds:   Hemodynamically stable in NSR. CI = 2.2, SVO2 83 on milrinone  0.25, epi 3, NE 2  Did not like being turned with drop in BP, low flow alarm.  Has started to wake up on vent.   Urine output good  CT output low  CBC    Component Value Date/Time   WBC 14.7 (H) 12/23/2024 1329   RBC 2.80 (L) 12/23/2024 1329   HGB 8.9 (L) 12/23/2024 1329   HGB 12.5 12/06/2024 1556   HGB 12.8 06/16/2023 1441   HCT 26.0 (L) 12/23/2024 1329   HCT 39.3 06/16/2023 1441   PLT 136 (L) 12/23/2024 1329   PLT 307 12/06/2024 1556   MCV 92.9 12/23/2024 1329   MCV 89 06/16/2023 1441   MCV 86 02/14/2021 0000   MCH 31.8 12/23/2024 1329   MCHC 34.2 12/23/2024 1329   RDW 15.7 (H) 12/23/2024 1329   RDW 13.5 06/16/2023 1441   LYMPHSABS 2.7 12/06/2024 1556   LYMPHSABS 2.0 06/16/2023 1441   MONOABS 0.8 12/06/2024 1556   EOSABS 0.2 12/06/2024 1556   EOSABS 0.2 06/16/2023 1441   BASOSABS 0.0 12/06/2024 1556   BASOSABS 0.0 06/16/2023 1441     BMET    Component Value Date/Time   NA 140 12/23/2024 1308   NA 138 10/10/2024 1609   K 3.6 12/23/2024 1308   CL 100 12/23/2024 1115   CO2 17 (L) 12/23/2024 0601   GLUCOSE 168 (H) 12/23/2024 1115   BUN 14 12/23/2024 1115   BUN 25 10/10/2024 1609   CREATININE 1.30 (H) 12/23/2024 1115   CREATININE 1.51 (H) 12/06/2024 1556   CALCIUM  7.5 (L) 12/23/2024 0601   EGFR 30 (L) 10/10/2024 1609   GFRNONAA 41 (L) 12/23/2024 0601   GFRNONAA 36 (L) 12/06/2024 1556     A/P:  Stable postop course. Continue current plans.   "

## 2024-12-23 NOTE — Progress Notes (Signed)
 Upon RT arrival to OR, RT received a call that patient has arrived to room 2H14. RT found that patient was manually ventilated by CRNA from OR to ICU without iNO inline. Nitric machine found at the patient bedside turned off, time patient was without iNO is unknown.  RT placed patient on ventilator with nitric inline at 20ppm at 1311.

## 2024-12-23 NOTE — Anesthesia Procedure Notes (Signed)
 Procedure Name: Intubation Date/Time: 12/23/2024 7:59 AM  Performed by: Leopoldo Wanda DASEN, CRNAPre-anesthesia Checklist: Patient identified, Emergency Drugs available, Suction available and Patient being monitored Patient Re-evaluated:Patient Re-evaluated prior to induction Oxygen Delivery Method: Circle System Utilized Preoxygenation: Pre-oxygenation with 100% oxygen Induction Type: IV induction Ventilation: Mask ventilation without difficulty Laryngoscope Size: Mac and 3 Grade View: Grade I Tube type: Oral Tube size: 7.5 mm Number of attempts: 1 Airway Equipment and Method: Stylet and Oral airway Placement Confirmation: ETT inserted through vocal cords under direct vision, positive ETCO2 and breath sounds checked- equal and bilateral Secured at: 21 cm Tube secured with: Tape Dental Injury: Teeth and Oropharynx as per pre-operative assessment

## 2024-12-23 NOTE — Op Note (Signed)
 CARDIOVASCULAR SURGERY OPERATIVE NOTE  Karen Warner [id number] 12/23/2024   Surgeon:  Dorise LOIS Fellers, MD  First Assistant: Laurel Becket, PA-C: An experienced assistant was required given the complexity of this surgery and the standard of surgical care. The assistant was needed for exposure, dissection, suctioning, retraction of delicate tissues and sutures, instrument exchange and for overall help during this procedure.   Preoperative Diagnosis: Class IV heart failure with LVEF <25%   Postoperative Diagnosis:  Same   Procedure  Median Sternotomy Extracorporeal circulation 3.   Implantation of HeartMate 3 Left Ventricular Assist Device.   Anesthesia:  General Endotracheal   Clinical History/Surgical Indication:  She is a 74 year old woman who presents with end stage ischemic cardiomyopathy. She has had a CRTD and Barostim (01/13/24) implanted and was admitted with acute on chronic CHF with chest and abdominal pain, low CO on RHC. She stabilized on milrinone . She was discussed at [facility] and LVAD was felt to be the best treatment for her.   The operative procedure has been discussed with the patient and family including alternatives, benefits and risks; including but not limited to bleeding, blood transfusion, infection, stroke, myocardial infarction, pump failure or thrombus possibly requiring replacement, RV dysfunction requiring an RVAD or long term milrinone  therapy, heart block requiring a permanent pacemaker, organ dysfunction, and death.  The patient understands and agrees to proceed.      Preparation:  The patient was seen in the preoperative holding area and the correct patient, correct operation were confirmed with the patient after reviewing the medical record and catheterization. The consent was signed by me. Preoperative antibiotics were given. A pulmonary arterial line and radial arterial line were placed by the anesthesia team. The patient was taken back  to the operating room and positioned supine on the operating room table. After being placed under general endotracheal anesthesia by the anesthesia team a foley catheter was placed. The neck, chest, abdomen, and both legs were prepped with betadine soap and solution and draped in the usual sterile manner. A surgical time-out was taken and the correct patient and operative procedure were confirmed with the nursing and anesthesia staff.   Pre-bypass TEE:   Complete TEE assessment was performed by Dr. Leopoldo. This showed a markedly dilated LV with EF< 25%. RV systolic function was normal. Trivial central AI, mild MR, trace TR around the PA catheter. No PFO. Few bubbles after 6 cardiac cycles consistent with intrapulmonary shunt.  Median sternotomy:    A median sternotomy was performed. The pericardium was opened in the midline. Right ventricular function appeared normal. The ascending aorta was of normal size and had no palpable plaque. There were no contraindications to aortic cannulation or cross-clamping. The pericardium was opened along the left diaphragm out to the apex.  Then a 4 cm transverse incision was made to the left of the umbilicus and continued down to the rectus fascia using electrocautery. This was deep. A skin punch was used to create the drive line exit site about 3 cm below the right costal margin in the anterior axillary line. The tunneling device was passed in a subcutaneous plane from the transverse abdominal incision to the pericardium. Another tunneling device was passed from the abdominal incision the the skin exit site.  Cardiopulmonary Bypass:   The patient was fully systemically heparinized and the ACT was maintained > 400 sec. The proximal aortic arch was cannulated with a 20 F aortic cannula for arterial inflow. Venous cannulation was performed via  the right atrial appendage using a two-staged venous cannula. Carbon dioxide was insufflated into the pericardium at 6L/min  throughout the procedure to minimize intracardiac air. The systemic temperature was allowed to drift downward slightly during the procedure and the patient was actively rewarmed.   Insertion of apical outflow cannula: Assisted by Laurel Becket, PA-C.  The patient was placed on cardiopulmonary bypass. Laparotomy pads were placed behind the heart to aid exposure of the apex. A site was chosen on the anterior wall lateral to the LAD and superior to the true apex. A stab incision was made and a foley catheter placed through the coring knife and into the left ventricle. The balloon was inflated and with gentle traction on the catheter the coring knife was carefully advanced toward the mitral valve to create the ventriculotomy. The balloon was deflated and removed. A sump was placed into the LV and the ventricular core excised and sent to pathology. Trabeculations that might cause obstruction were excised. Then a series of 2-0 Ethibond horizontal mattress sutures were placed around the ventriculotomy. This took 16 sutures. The sutures were placed through the apical cuff. The sutures were tied and the apical cuff appeared to be well-sealed to the apex. Prevaleak was applied to seal needle holes. Then volume was left in the heart and the lungs inflated. The HeartMate 3 was brought onto the operative field. The inflow cannula was inserted into the apical cuff and the apical cuff lock engaged.  The drive line was then tunneled using the previously placed tunneling devices. The bend relief was  tested with an axial pull. The heart and pump were returned to the pericardium and appeared to sit nicely.   Aortic outflow graft anastomosis: Assisted by Laurel Becket, PA-C  The outflow graft was distended and positioned along the inferior margin of the heart and around the right atrium. The outflow graft was cut to the appropriate length. The ascending aorta was partially occluded with a Lemole clamp. A slightly  diagonall midline aortotomy was created and the ends enlarged with a 4.5 mm punch. The outflow graft was anastomosed to the aorta using 4-0 prolene pledgetted sutures at the toe and heal that were run down each side. Prevaleak was used to seal the needle holes in the graft. The Lemole clamp was removed and the graft clamped with a peripheral Debakey clamp and deaired using a 21 gauge needle. The aortic anastomosis was hemostatic.   Weaning from bypass:  The patient was started on Milrinone  0..375 mcg/kg/min,  levophed   and nitric oxide at 20 ppm. The HeartMate 3 pump was started at 3000 rpm. TEE confirmed that the intracardiac air was removed.  Cardiopulmonary bypass flow was decreased to half flow and the clamp removed from the outflow graft. The speed was gradually increased to 4000 rpm. Flow was decreased to 1/4 and the speed gradually increased to 4600 rpm. Cardiopulmonary bypass was discontinued. Pulmonary artery pressures were normal with a CVP of 10. CO was 3.8 L/min. CPB time was 100 minutes. TEE showed excellent apical cannula position. There was trivial MR, trivial TR. RV function appeared normal.  Completion:  Two temporary epicardial pacing wires were placed on the right atrium and a bipolar lead on the inferior wall.  Heparin  was fully reversed with protamine and the aortic and venous cannulas removed. Hemostasis was achieved. Mediastinal drainage tubes and bilateral pleural chest tubes were placed.  The sternum was closed with #6 stainless steel wires. The fascia was closed with continuous # 1  vicryl suture. The subcutaneous tissue was closed with 2-0 vicryl continuous suture. The skin was closed with 3-0 vicryl subcuticular suture. The abdominal incision was closed with continuous 3-0 vicryl subcutaneous suture and 3-0 vicryl subcuticular skin suture. The drive line exit site was re-approximated  3-0 nylon skin sutures. The two drive line fasteners were applied. All sponge, needle, and  instrument counts were reported correct at the end of the case. Dry sterile dressings were placed over the incisions and around the chest tubes which were connected to pleurevac suction. The patient was then transported to the surgical intensive care unit in stable condition.

## 2024-12-23 NOTE — Progress Notes (Addendum)
 "    Advanced Heart Failure Rounding Note  Cardiologist: Dub Huntsman, DO  [facility] Cardiologist: Dr Rolan  Chief Complaint: Post Op HMIII LVAD  Patient Profile   Karen Warner is a 74 y.o. female with history of CAD and ischemic cardiomyopathy, EF <20%, CKD III and COPD admitted w/ a/c CHF, chest/abdominal pain. RHC c/w low output.   Significant events:   1/15: RHC (RA 2, PA 21/10, PCW 3,  PA sat 59%, TD CI 1.49, FICK CI 1.88, PAPi 5.5), started on milrinone            LHC patent stents, nonobstructive CAD   1/19: co-ox persistently in 40s, milrinone  increased 0.375 12/23/24: S/P HMIII Implant.Intra-op speed placed at 4700 due to low filling pressures.  Received 2UPRBC, 3 FFP, cellsaver, 1 albumin, and 4 FFP    Subjective:   On arrival to ICU   Norepi 0.5 mcg + Milrinone  0.25 mcg.   VAD Speed 4600 Flow 3.5 W 2.8 PI 3.8     Objective:   Weight Range: 73.8 kg Body mass index is 27.07 kg/m.   Vital Signs:   Temp:  [97.6 F (36.4 C)-98 F (36.7 C)] 98 F (36.7 C) (01/23 0644) Pulse Rate:  [100-114] 100 (01/23 0644) Resp:  [17-20] 20 (01/23 0644) BP: (94-127)/(60-82) 122/79 (01/23 0644) SpO2:  [98 %-99 %] 99 % (01/23 0644) Weight:  [73.8 kg] 73.8 kg (01/23 0634) Last BM Date : 12/19/24  Weight change: Filed Weights   12/22/24 0156 12/23/24 0107 12/23/24 0634  Weight: 73.8 kg 73.8 kg 73.8 kg    Intake/Output:   Intake/Output Summary (Last 24 hours) at 12/23/2024 1054 Last data filed at 12/23/2024 1051 Gross per 24 hour  Intake 2370 ml  Output 700 ml  Net 1670 ml     Physical Exam   Physical Exam: GENERAL: Intubated.  NECK: Supple, JVP difficult to assess.   CARDIAC:  Mechanical heart sounds with LVAD hum present.  LUNGS:  Clear to auscultation bilaterally.  ABDOMEN: Soft.    LLQ Ecchymotic area, soft  LVAD exit site:  Dressing dry and intact.   EXTREMITIES:  Warm and dry, no cyanosis, clubbing, rash or edema  NEUROLOGIC:  Intubated  Telemetry    SR  Labs   CBC Recent Labs    12/23/24 0302 12/23/24 0754 12/23/24 1015 12/23/24 1032  WBC 10.0  --   --   --   HGB 11.6*   < > 7.6* 7.5*  HCT 35.5*   < > 22.5* 22.0*  MCV 97.3  --   --   --   PLT 283  --  174  --    < > = values in this interval not displayed.   Basic Metabolic Panel Recent Labs    98/76/73 0223 12/23/24 0601 12/23/24 0754 12/23/24 0903 12/23/24 0924 12/23/24 0958 12/23/24 1032  NA 134* 137   < > 137   < > 138 136  K 4.4 3.7   < > 4.4   < > 4.9 4.7  CL 101 111   < > 105  --  99  --   CO2 22 17*  --   --   --   --   --   GLUCOSE 182* 183*   < > 147*  --  149*  --   BUN 16 13   < > 14  --  12  --   CREATININE 1.75* 1.36*   < > 1.50*  --  1.40*  --  CALCIUM  9.6 7.5*  --   --   --   --   --   MG 2.4  --   --   --   --   --   --    < > = values in this interval not displayed.   Liver Function Tests Recent Labs    12/23/24 0223  AST 19  ALT 8  ALKPHOS 62  BILITOT 0.3  PROT 7.3  ALBUMIN 4.0   No results for input(s): LIPASE, AMYLASE in the last 72 hours. Cardiac Enzymes No results for input(s): CKTOTAL, CKMB, CKMBINDEX, TROPONINI in the last 72 hours.  BNP: BNP (last 3 results) Recent Labs    09/01/24 1457  BNP 169.4*    ProBNP (last 3 results) Recent Labs    12/13/24 1529 12/14/24 0826  PROBNP 1,342.0* 1,539.0*     D-Dimer No results for input(s): DDIMER in the last 72 hours. Hemoglobin A1C No results for input(s): HGBA1C in the last 72 hours. Fasting Lipid Panel No results for input(s): CHOL, HDL, LDLCALC, TRIG, CHOLHDL, LDLDIRECT in the last 72 hours. Medications:   Scheduled Medications:  [MAR Hold] alum & mag hydroxide-simeth  15 mL Oral BID   [MAR Hold] aspirin  EC  81 mg Oral Daily   [MAR Hold] Chlorhexidine  Gluconate Cloth  6 each Topical Daily   [MAR Hold] cyanocobalamin   1,000 mcg Oral Daily   [MAR Hold] diclofenac  Sodium  2 g Topical QID   [MAR Hold] dicyclomine   10 mg Oral TID AC    epinephrine   0-10 mcg/min Intravenous To OR   [MAR Hold] ezetimibe   10 mg Oral Daily   [MAR Hold] folic acid   1 mg Oral Daily   [MAR Hold] heparin   5,000 Units Subcutaneous Q8H   [MAR Hold] insulin  aspart  0-15 Units Subcutaneous TID WC   [MAR Hold] insulin  glargine  25 Units Subcutaneous BID   [MAR Hold] isosorbide  mononitrate  15 mg Oral Daily   [MAR Hold] lactose free nutrition  237 mL Oral TID WC   [MAR Hold] lidocaine   1 patch Transdermal Q24H   [MAR Hold] loratadine   10 mg Oral Daily   magnesium  sulfate  40 mEq Other To OR   [MAR Hold] multivitamin with minerals  1 tablet Oral Daily   mupirocin  ointment  1 Application Nasal BID   [MAR Hold] pantoprazole   40 mg Oral Daily   phenylephrine   0-100 mcg/min Intravenous To OR   [MAR Hold] polyethylene glycol  17 g Oral BID   potassium chloride   80 mEq Other To OR   [MAR Hold] ranolazine   500 mg Oral BID   [MAR Hold] rosuvastatin   40 mg Oral QHS   [MAR Hold] senna  1 tablet Oral BID   [MAR Hold] sodium chloride  flush  10-40 mL Intracatheter Q12H   [MAR Hold] sodium chloride  flush  3 mL Intravenous Q12H   tranexamic acid   2 mg/kg Intracatheter To OR   [MAR Hold] valACYclovir   500 mg Oral Daily   vancomycin   1,000 mg Other To OR   vasopressin   0.04 Units/min Intravenous To OR   [MAR Hold] Vitamin D  (Ergocalciferol )  50,000 Units Oral Q Sun   [facility] Hold] vortioxetine  HBr  5 mg Oral QHS    Infusions:   ceFAZolin  (ANCEF ) IV     DOBUTamine      DOPamine      heparin  30,000 units/NS 1000 mL solution for CELLSAVER     lactated ringers      milrinone  Stopped (12/23/24  9263)   nitroGLYCERIN      norepinephrine  (LEVOPHED ) Adult infusion     rifampin  (RIFADIN ) 600 mg in sodium chloride  0.9 % 100 mL IVPB      PRN Medications: [MAR Hold] acetaminophen , [MAR Hold] albuterol , [MAR Hold] bisacodyl , [MAR Hold] cyclobenzaprine , hemostatic agents (no charge) Optime, hemostatic agents (no charge) Optime, heparin , [MAR Hold] HYDROmorphone , [MAR  Hold] nitroGLYCERIN , [MAR Hold] ondansetron , [MAR Hold] mouth rinse, [MAR Hold] sodium chloride , [MAR Hold] sodium chloride  flush, [MAR Hold] sodium chloride  flush, Surgifoam 1 Gm with Thrombin 20,000 units (4 ml) topical solution, temazepam , thrombin, [MAR Hold] Ubrogepant , vancomycin   Assessment/Plan   1. S/P LVAD HMIII Intra Op Speed 4600 due to low filling pressures.  On arrival intubated on Norepi 0.5 + milrinone  0.25 mcg. Check CO-OX  CCM following for extubation.  Maps sensitive to titration. Avoid hypotension.   2.  Acute on chronic systolic CHF: Ischemic cardiomyopathy.  [employer] CRT-D device.  Echo in 8/24 showed EF < 20%, moderate LV dilation, mild LVH, RV normal.  This is somewhat worse than priors. LHC/RHC in 11/24 showed low CI at 2.12, normal filling pressures, unchanged coronary anatomy with no severe stenoses. S/p barostimulation activation therapy device 2/25. Echo from [facility] showed EF 20-25%, paradoxical LV motion. Echo 12/25 showed EF<20%, normal RV.  We have been concerned about possible low output HF, wonder if this could be contributing to her GI symptoms.  RHC/LHC this admit showed stable coronary anatomy and low filling pressures but low output by Fick and thermodilution.  - She is end-stage NYHA IV. MRB approved LVAD for Destination Therapy.  -As above S/P HMIII LVAD. Intra-op speed at 4600 as above.     3. CAD: PCI to mid/distal LAD in 2020 and proximal LAD in 2021.  LHC in 11/24 showed stable coronary anatomy with no severe stenoses. She reported to the ER with 2 days of atypical chest pain.  TnT minimally elevated with no trend, this is probably due to demand ischemia.  Cath this admit showed stable coronary anatomy.  - Off ranolazine , imdur , plavix  - Restart Crestor  + Zetia  in a few days.    4. COPD: History of smoking, quit 2021.    5. CKD 3: Follows with nephology at [facility].   6. Obesity: Has been on semaglutide.Now on hold.    7. PAD:  Moderately decreased ABI on right in 7/25. Medical mgmt per Dr. Serene. Again ABI on 1/19, the R had moderate disease, and mild on the L. No claudication or other PAD symptoms.   8. GI: RUQ and epigastric pain.  No evidence by US  for acute cholecystitis. GI symptoms seem to have improved with higher dose of milrinone , possible that symptoms were related to low cardiac output. - seen by GI.     Length of Stay: 8  Amy Clegg, NP  12/23/2024, 10:54 AM  Advanced Heart Failure Team Pager [phone] (M-F; 7a - 5p)   Please visit [email or web address]: For overnight coverage please call cardiology fellow first. If fellow not available call Shock/ECMO MD on call.  For ECMO / Mechanical Support (Impella, IABP, LVAD) issues call Shock / ECMO MD on call.   Agree with above.   She is s/p HM-3 LVAD implantation today. Did well with implant but intolerant of flows higher than 4600 in OR due to volume depletion/vasoplegia.   Has been receiving albumin.   Outputs now stable. Did have temporary decompensation on turning with hypertension and low flows but now stabilized   Now NE  1 epi 2-3 VP and iNO  VAD interrogated personally. Parameters stable.  General:  intubated/sedated HEENT: + ETT Neck:RIJ swan Cor: sternal dressing  LVAD hum.  Lungs:mechanical breath sounds Abdomen: soft, nontender, non-distended. No hepatosplenomegaly. No bruits or masses. hypoactive bowel sounds. Driveline site clean. Anchor in place.  Extremities: no cyanosis, clubbing, rash. Warm tr edema  Neuro: sedated on vent  Will leave intubated overnight. Adjust pressors/inotropes as need for goal MAP 70-80. If having problem with flows can give more albumin/volume. Keep speed at 4600 for now.   Plan vent wean in am.   D/w Dr. Lucas and CCM at bedside  CRITICAL CARE Performed by: Jayvien Rowlette  Total critical care time: 55 minutes  Critical care time was exclusive of separately billable procedures and treating other  patients.  Critical care was necessary to treat or prevent imminent or life-threatening deterioration.  Critical care was time spent personally by me (independent of midlevel providers or residents) on the following activities: development of treatment plan with patient and/or surrogate as well as nursing, discussions with consultants, evaluation of patient's response to treatment, examination of patient, obtaining history from patient or surrogate, ordering and performing treatments and interventions, ordering and review of laboratory studies, ordering and review of radiographic studies, pulse oximetry and re-evaluation of patient's condition.  Toribio Fuel, MD  6:12 PM      "

## 2024-12-24 ENCOUNTER — Inpatient Hospital Stay (HOSPITAL_COMMUNITY)

## 2024-12-24 DIAGNOSIS — N1832 Chronic kidney disease, stage 3b: Secondary | ICD-10-CM | POA: Diagnosis not present

## 2024-12-24 DIAGNOSIS — I5023 Acute on chronic systolic (congestive) heart failure: Secondary | ICD-10-CM | POA: Diagnosis not present

## 2024-12-24 DIAGNOSIS — I255 Ischemic cardiomyopathy: Secondary | ICD-10-CM | POA: Diagnosis not present

## 2024-12-24 DIAGNOSIS — R739 Hyperglycemia, unspecified: Secondary | ICD-10-CM | POA: Diagnosis not present

## 2024-12-24 DIAGNOSIS — K219 Gastro-esophageal reflux disease without esophagitis: Secondary | ICD-10-CM | POA: Diagnosis not present

## 2024-12-24 LAB — COMPREHENSIVE METABOLIC PANEL WITH GFR
ALT: 12 U/L (ref 0–44)
AST: 117 U/L — ABNORMAL HIGH (ref 15–41)
Albumin: 3.8 g/dL (ref 3.5–5.0)
Alkaline Phosphatase: 52 U/L (ref 38–126)
Anion gap: 13 (ref 5–15)
BUN: 12 mg/dL (ref 8–23)
CO2: 19 mmol/L — ABNORMAL LOW (ref 22–32)
Calcium: 8.5 mg/dL — ABNORMAL LOW (ref 8.9–10.3)
Chloride: 108 mmol/L (ref 98–111)
Creatinine, Ser: 1.52 mg/dL — ABNORMAL HIGH (ref 0.44–1.00)
GFR, Estimated: 36 mL/min — ABNORMAL LOW
Glucose, Bld: 180 mg/dL — ABNORMAL HIGH (ref 70–99)
Potassium: 4.9 mmol/L (ref 3.5–5.1)
Sodium: 140 mmol/L (ref 135–145)
Total Bilirubin: 0.6 mg/dL (ref 0.0–1.2)
Total Protein: 5.9 g/dL — ABNORMAL LOW (ref 6.5–8.1)

## 2024-12-24 LAB — CBC
HCT: 28.3 % — ABNORMAL LOW (ref 36.0–46.0)
Hemoglobin: 9.4 g/dL — ABNORMAL LOW (ref 12.0–15.0)
MCH: 31.8 pg (ref 26.0–34.0)
MCHC: 33.2 g/dL (ref 30.0–36.0)
MCV: 95.6 fL (ref 80.0–100.0)
Platelets: 198 10*3/uL (ref 150–400)
RBC: 2.96 MIL/uL — ABNORMAL LOW (ref 3.87–5.11)
RDW: 17.2 % — ABNORMAL HIGH (ref 11.5–15.5)
WBC: 18.5 10*3/uL — ABNORMAL HIGH (ref 4.0–10.5)
nRBC: 0.6 % — ABNORMAL HIGH (ref 0.0–0.2)

## 2024-12-24 LAB — GLUCOSE, CAPILLARY
Glucose-Capillary: 118 mg/dL — ABNORMAL HIGH (ref 70–99)
Glucose-Capillary: 125 mg/dL — ABNORMAL HIGH (ref 70–99)
Glucose-Capillary: 132 mg/dL — ABNORMAL HIGH (ref 70–99)
Glucose-Capillary: 138 mg/dL — ABNORMAL HIGH (ref 70–99)
Glucose-Capillary: 138 mg/dL — ABNORMAL HIGH (ref 70–99)
Glucose-Capillary: 149 mg/dL — ABNORMAL HIGH (ref 70–99)
Glucose-Capillary: 149 mg/dL — ABNORMAL HIGH (ref 70–99)
Glucose-Capillary: 152 mg/dL — ABNORMAL HIGH (ref 70–99)
Glucose-Capillary: 154 mg/dL — ABNORMAL HIGH (ref 70–99)
Glucose-Capillary: 165 mg/dL — ABNORMAL HIGH (ref 70–99)
Glucose-Capillary: 178 mg/dL — ABNORMAL HIGH (ref 70–99)
Glucose-Capillary: 181 mg/dL — ABNORMAL HIGH (ref 70–99)
Glucose-Capillary: 181 mg/dL — ABNORMAL HIGH (ref 70–99)
Glucose-Capillary: 186 mg/dL — ABNORMAL HIGH (ref 70–99)
Glucose-Capillary: 188 mg/dL — ABNORMAL HIGH (ref 70–99)
Glucose-Capillary: 188 mg/dL — ABNORMAL HIGH (ref 70–99)
Glucose-Capillary: 194 mg/dL — ABNORMAL HIGH (ref 70–99)
Glucose-Capillary: 195 mg/dL — ABNORMAL HIGH (ref 70–99)
Glucose-Capillary: 207 mg/dL — ABNORMAL HIGH (ref 70–99)
Glucose-Capillary: 231 mg/dL — ABNORMAL HIGH (ref 70–99)

## 2024-12-24 LAB — CBC WITH DIFFERENTIAL/PLATELET
Abs Immature Granulocytes: 0.1 10*3/uL — ABNORMAL HIGH (ref 0.00–0.07)
Basophils Absolute: 0.1 10*3/uL (ref 0.0–0.1)
Basophils Relative: 0 %
Eosinophils Absolute: 0 10*3/uL (ref 0.0–0.5)
Eosinophils Relative: 0 %
HCT: 28.3 % — ABNORMAL LOW (ref 36.0–46.0)
Hemoglobin: 9.1 g/dL — ABNORMAL LOW (ref 12.0–15.0)
Immature Granulocytes: 1 %
Lymphocytes Relative: 9 %
Lymphs Abs: 1.6 10*3/uL (ref 0.7–4.0)
MCH: 30.3 pg (ref 26.0–34.0)
MCHC: 32.2 g/dL (ref 30.0–36.0)
MCV: 94.3 fL (ref 80.0–100.0)
Monocytes Absolute: 2.3 10*3/uL — ABNORMAL HIGH (ref 0.1–1.0)
Monocytes Relative: 13 %
Neutro Abs: 13.6 10*3/uL — ABNORMAL HIGH (ref 1.7–7.7)
Neutrophils Relative %: 77 %
Platelets: 202 10*3/uL (ref 150–400)
RBC: 3 MIL/uL — ABNORMAL LOW (ref 3.87–5.11)
RDW: 17 % — ABNORMAL HIGH (ref 11.5–15.5)
WBC: 17.6 10*3/uL — ABNORMAL HIGH (ref 4.0–10.5)
nRBC: 0.8 % — ABNORMAL HIGH (ref 0.0–0.2)

## 2024-12-24 LAB — BPAM RBC
Blood Product Expiration Date: 202602092359
Unit Type and Rh: 6200

## 2024-12-24 LAB — POCT I-STAT 7, (LYTES, BLD GAS, ICA,H+H)
Acid-base deficit: 6 mmol/L — ABNORMAL HIGH (ref 0.0–2.0)
Acid-base deficit: 7 mmol/L — ABNORMAL HIGH (ref 0.0–2.0)
Bicarbonate: 18.7 mmol/L — ABNORMAL LOW (ref 20.0–28.0)
Bicarbonate: 17.4 mmol/L — ABNORMAL LOW (ref 20.0–28.0)
Calcium, Ion: 1.24 mmol/L (ref 1.15–1.40)
Calcium, Ion: 1.17 mmol/L (ref 1.15–1.40)
HCT: 27 % — ABNORMAL LOW (ref 36.0–46.0)
HCT: 27 % — ABNORMAL LOW (ref 36.0–46.0)
Hemoglobin: 9.2 g/dL — ABNORMAL LOW (ref 12.0–15.0)
Hemoglobin: 9.2 g/dL — ABNORMAL LOW (ref 12.0–15.0)
O2 Saturation: 100 %
O2 Saturation: 97 %
Patient temperature: 38.2
Patient temperature: 97.9
Potassium: 4.6 mmol/L (ref 3.5–5.1)
Potassium: 3.9 mmol/L (ref 3.5–5.1)
Sodium: 139 mmol/L (ref 135–145)
Sodium: 137 mmol/L (ref 135–145)
TCO2: 20 mmol/L — ABNORMAL LOW (ref 22–32)
TCO2: 18 mmol/L — ABNORMAL LOW (ref 22–32)
pCO2 arterial: 35.6 mmHg (ref 32–48)
pCO2 arterial: 31.1 mmHg — ABNORMAL LOW (ref 32–48)
pH, Arterial: 7.333 — ABNORMAL LOW (ref 7.35–7.45)
pH, Arterial: 7.354 (ref 7.35–7.45)
pO2, Arterial: 199 mmHg — ABNORMAL HIGH (ref 83–108)
pO2, Arterial: 91 mmHg (ref 83–108)

## 2024-12-24 LAB — BPAM FFP
Blood Product Expiration Date: 202601272359
Blood Product Expiration Date: 202601272359
Blood Product Expiration Date: 202601272359
Blood Product Expiration Date: 202601272359
ISSUE DATE / TIME: 202601230827
ISSUE DATE / TIME: 202601230827
ISSUE DATE / TIME: 202601230827
ISSUE DATE / TIME: 202601230827
Unit Type and Rh: 6200
Unit Type and Rh: 6200
Unit Type and Rh: 6200
Unit Type and Rh: 6200

## 2024-12-24 LAB — BASIC METABOLIC PANEL WITH GFR
Anion gap: 10 (ref 5–15)
BUN: 10 mg/dL (ref 8–23)
CO2: 18 mmol/L — ABNORMAL LOW (ref 22–32)
Calcium: 8.5 mg/dL — ABNORMAL LOW (ref 8.9–10.3)
Chloride: 107 mmol/L (ref 98–111)
Creatinine, Ser: 1.3 mg/dL — ABNORMAL HIGH (ref 0.44–1.00)
GFR, Estimated: 43 mL/min — ABNORMAL LOW
Glucose, Bld: 156 mg/dL — ABNORMAL HIGH (ref 70–99)
Potassium: 4 mmol/L (ref 3.5–5.1)
Sodium: 136 mmol/L (ref 135–145)

## 2024-12-24 LAB — PREPARE FRESH FROZEN PLASMA: Unit division: 0

## 2024-12-24 LAB — CALCIUM, IONIZED: Calcium, Ionized, Serum: 4.3 mg/dL — ABNORMAL LOW (ref 4.5–5.6)

## 2024-12-24 LAB — PHOSPHORUS: Phosphorus: 2.9 mg/dL (ref 2.5–4.6)

## 2024-12-24 LAB — COOXEMETRY PANEL
Carboxyhemoglobin: 1.8 % — ABNORMAL HIGH (ref 0.5–1.5)
Methemoglobin: <0.7 % (ref 0.0–1.5)
O2 Saturation: 73.4 %
Total hemoglobin: 9.2 g/dL — ABNORMAL LOW (ref 12.0–16.0)

## 2024-12-24 LAB — MAGNESIUM
Magnesium: 2.8 mg/dL — ABNORMAL HIGH (ref 1.7–2.4)
Magnesium: 2.5 mg/dL — ABNORMAL HIGH (ref 1.7–2.4)

## 2024-12-24 LAB — LACTATE DEHYDROGENASE: LDH: 464 U/L — ABNORMAL HIGH (ref 105–235)

## 2024-12-24 LAB — PROTIME-INR
INR: 1.3 — ABNORMAL HIGH (ref 0.8–1.2)
Prothrombin Time: 16.5 s — ABNORMAL HIGH (ref 11.4–15.2)

## 2024-12-24 LAB — PRO BRAIN NATRIURETIC PEPTIDE: Pro Brain Natriuretic Peptide: 3345 pg/mL — ABNORMAL HIGH

## 2024-12-24 MED ORDER — ALBUMIN HUMAN 5 % IV SOLN
25.0000 g | Freq: Once | INTRAVENOUS | Status: AC
  Administered 2024-12-24: 25 g via INTRAVENOUS
  Filled 2024-12-24: qty 500

## 2024-12-24 MED ORDER — MILRINONE LACTATE IN DEXTROSE 20-5 MG/100ML-% IV SOLN
0.2500 ug/kg/min | INTRAVENOUS | Status: DC
  Administered 2024-12-24 – 2024-12-30 (×14): 0.375 ug/kg/min via INTRAVENOUS
  Administered 2024-12-31: 0.125 ug/kg/min via INTRAVENOUS
  Administered 2024-12-31: 0.25 ug/kg/min via INTRAVENOUS
  Filled 2024-12-24 (×15): qty 100

## 2024-12-24 MED ORDER — CLEVIDIPINE BUTYRATE 0.5 MG/ML IV EMUL
0.0000 mg/h | INTRAVENOUS | Status: DC
  Administered 2024-12-24 – 2024-12-25 (×2): 1 mg/h via INTRAVENOUS
  Administered 2024-12-25: 12 mg/h via INTRAVENOUS
  Administered 2024-12-25 – 2024-12-27 (×2): 10 mg/h via INTRAVENOUS
  Administered 2024-12-27: 1 mg/h via INTRAVENOUS
  Administered 2024-12-28: 12 mg/h via INTRAVENOUS
  Administered 2024-12-28: 10 mg/h via INTRAVENOUS
  Filled 2024-12-24 (×9): qty 100

## 2024-12-24 MED ORDER — FENTANYL CITRATE (PF) 50 MCG/ML IJ SOSY
25.0000 ug | PREFILLED_SYRINGE | INTRAMUSCULAR | Status: DC | PRN
  Administered 2024-12-23: 100 ug via INTRAVENOUS
  Administered 2024-12-24: 25 ug via INTRAVENOUS
  Administered 2024-12-24: 50 ug via INTRAVENOUS
  Administered 2024-12-24 (×6): 100 ug via INTRAVENOUS
  Administered 2024-12-24: 50 ug via INTRAVENOUS
  Administered 2024-12-24 – 2024-12-25 (×6): 100 ug via INTRAVENOUS
  Administered 2024-12-25: 25 ug via INTRAVENOUS
  Administered 2024-12-25 (×2): 100 ug via INTRAVENOUS
  Filled 2024-12-24 (×5): qty 2
  Filled 2024-12-24: qty 1
  Filled 2024-12-24 (×2): qty 2
  Filled 2024-12-24: qty 1
  Filled 2024-12-24 (×8): qty 2
  Filled 2024-12-24: qty 1

## 2024-12-24 MED ORDER — INSULIN ASPART 100 UNIT/ML IJ SOLN
0.0000 [IU] | INTRAMUSCULAR | Status: DC
  Administered 2024-12-24: 2 [IU] via SUBCUTANEOUS
  Administered 2024-12-24: 4 [IU] via SUBCUTANEOUS
  Administered 2024-12-24 – 2024-12-25 (×2): 2 [IU] via SUBCUTANEOUS
  Administered 2024-12-25 (×5): 4 [IU] via SUBCUTANEOUS
  Administered 2024-12-26: 12 [IU] via SUBCUTANEOUS
  Administered 2024-12-26 (×3): 8 [IU] via SUBCUTANEOUS
  Administered 2024-12-26 – 2024-12-27 (×3): 4 [IU] via SUBCUTANEOUS
  Administered 2024-12-27: 8 [IU] via SUBCUTANEOUS
  Administered 2024-12-27: 2 [IU] via SUBCUTANEOUS
  Administered 2024-12-27: 8 [IU] via SUBCUTANEOUS
  Administered 2024-12-27 – 2024-12-28 (×2): 4 [IU] via SUBCUTANEOUS
  Administered 2024-12-28: 8 [IU] via SUBCUTANEOUS
  Administered 2024-12-28: 4 [IU] via SUBCUTANEOUS
  Administered 2024-12-28: 8 [IU] via SUBCUTANEOUS
  Administered 2024-12-28: 4 [IU] via SUBCUTANEOUS
  Administered 2024-12-28 – 2024-12-29 (×2): 8 [IU] via SUBCUTANEOUS
  Administered 2024-12-29 (×2): 12 [IU] via SUBCUTANEOUS
  Filled 2024-12-24: qty 4
  Filled 2024-12-24: qty 12
  Filled 2024-12-24 (×2): qty 4
  Filled 2024-12-24: qty 2
  Filled 2024-12-24 (×3): qty 8
  Filled 2024-12-24: qty 12
  Filled 2024-12-24: qty 4
  Filled 2024-12-24: qty 8
  Filled 2024-12-24: qty 2
  Filled 2024-12-24 (×3): qty 4
  Filled 2024-12-24: qty 12
  Filled 2024-12-24 (×2): qty 8
  Filled 2024-12-24: qty 6
  Filled 2024-12-24 (×3): qty 4
  Filled 2024-12-24: qty 8
  Filled 2024-12-24: qty 4
  Filled 2024-12-24: qty 2
  Filled 2024-12-24 (×2): qty 4
  Filled 2024-12-24: qty 8
  Filled 2024-12-24: qty 4

## 2024-12-24 MED ORDER — HYDROMORPHONE HCL 2 MG PO TABS
1.0000 mg | ORAL_TABLET | ORAL | Status: DC | PRN
  Administered 2024-12-24: 2 mg via ORAL
  Filled 2024-12-24: qty 1

## 2024-12-24 MED ORDER — ALBUMIN HUMAN 5 % IV SOLN
INTRAVENOUS | Status: AC
  Administered 2024-12-24: 25 g via INTRAVENOUS
  Filled 2024-12-24: qty 250

## 2024-12-24 MED ORDER — HYDROMORPHONE HCL 2 MG PO TABS
1.0000 mg | ORAL_TABLET | ORAL | Status: DC | PRN
  Administered 2024-12-24: 1 mg via ORAL
  Filled 2024-12-24: qty 1

## 2024-12-24 MED ORDER — ORAL CARE MOUTH RINSE: 15.0000 mL | OROMUCOSAL | Status: DC | PRN

## 2024-12-24 MED ORDER — ALBUMIN HUMAN 5 % IV SOLN: 25.0000 g | Freq: Once | INTRAVENOUS | Status: AC

## 2024-12-24 MED ORDER — IPRATROPIUM-ALBUTEROL 0.5-2.5 (3) MG/3ML IN SOLN
RESPIRATORY_TRACT | Status: AC
  Administered 2024-12-24: 3 mL
  Filled 2024-12-24: qty 3

## 2024-12-24 MED ORDER — INSULIN GLARGINE 100 UNIT/ML ~~LOC~~ SOLN
11.0000 [IU] | Freq: Every day | SUBCUTANEOUS | Status: DC
  Administered 2024-12-24: 11 [IU] via SUBCUTANEOUS
  Filled 2024-12-24 (×2): qty 0.11

## 2024-12-24 NOTE — Progress Notes (Signed)
 Patient ID: Karen Warner [name], female   DOB: Sep 11, 1951, 74 y.o.   MRN: [id number] TCTS Evening Rounds:  Hemodynamically stable on milrinone  0.25, epi 3, NE 1.  SVO2 64, CI 2.5,   Pump flow 2.5 on 4600   PI 6-7 and very pulsatile arterial tracing so she may tolerate increased speed. Will let AHF team decide about that. CVP 7.  Extubated today. She is drowsy after some pain meds but seems to be breathing ok.  UO ok CT output low

## 2024-12-24 NOTE — Progress Notes (Signed)
 "  NAME:  Karen Warner, MRN:  [id number], DOB:  04-02-1951, LOS: 9 ADMISSION DATE:  12/14/2024, CONSULTATION DATE:  1/23 REFERRING MD:  Lucas, CHIEF COMPLAINT:  post-LVAD   History of Present Illness:  [name] is a 74 y/o woman with a history of ICM and HFrEF who presented on 1/14 with chest pain and decompensated heart failure. She underwent left and right heart catheterization this admission showing low CO and low filling pressures.  She was managed with inotropic support. Due to progression of disease and poor tolerance of GDMT, she was evaluated for LVAD.  Intraoperatively her course was uncomplicated other than low flows on LVAD when her BP went up at the end of the case when she was stimulated. She transferred to the ICU on epi 2, NE 1, milrinone  0.352mcg. TEE with normal RV function, evidence of intrapulmonary shunt.  Paralytics not reversed in ICU. Bypass time , crossclamp time 29 min, cell saver 225cc. LVAD speed 4600 RPM. Alarms on LVAD went off during periods of hypertension post-op, controlled by afterload reduction.   Pertinent  Medical History  HFrEF due to ICM DM2 IDA FM CKD3  Significant Hospital Events: Including procedures, antibiotic start and stop dates in addition to other pertinent events   1/14 admission 1/15 L&R heart cath> nonobstructive disease, low filling pressures 1/23 LVAD  Interim History / Subjective:  Febrile most of the night.  Endorsed pain this morning.   Objective    Blood pressure (!) 79/63, pulse (!) 106, temperature (!) 100.4 F (38 C), resp. rate (!) 21, height 5' 5 (1.651 m), weight 84.4 kg, SpO2 100%. PAP: (23-87)/(4-32) 26/20 CVP:  [0 mmHg-37 mmHg] 12 mmHg PCWP:  [17 mmHg] 17 mmHg CO:  [3.4 L/min-4.4 L/min] 3.6 L/min CI:  [1.88 L/min/m2-2.4 L/min/m2] 2 L/min/m2  Vent Mode: SIMV;PRVC;PSV FiO2 (%):  [50 %] 50 % Set Rate:  [16 bmp] 16 bmp Vt Set:  [450 mL] 450 mL PEEP:  [5 cmH20] 5 cmH20 Pressure Support:  [10 cmH20]  10 cmH20 Plateau Pressure:  [20 cmH20-21 cmH20] 20 cmH20   Intake/Output Summary (Last 24 hours) at 12/24/2024 0711 Last data filed at 12/24/2024 0700 Gross per 24 hour  Intake 7060.5 ml  Output 2192 ml  Net 4868.5 ml   Filed Weights   12/23/24 0107 12/23/24 0634 12/24/24 0500  Weight: 73.8 kg 73.8 kg 84.4 kg    Examination: General: critically ill appearing woman lying in bed sleeping HENT: [place]/AT, eyes anicteric Lungs: breathing comfortably on MV, 8/5. CTAB Cardiovascular: mechanical LVAD hum, minimal bloody output from chest tubes.  Abdomen: soft, NT Extremities: no cyanosis or edema Neuro: RASS -4; later was -1, able to follow commands.  GU: foley with clear yellow urine  BUN 12 Cr 1.52 LDH 464  WBC 17.6 H/H 9.1/28.3 Platelets 202  CXR personally reviewed>  low lung volumes. LVAD, AICD, barostim, chest tubes, ETT, CVC in place.    Resolved problem list   Assessment and Plan   Acute on chronic HFrEF due to ICM; NYHA class IV heart failure s/p LVAD on 1/23 -post-op care per TCTS -LVAD adjustments per AHF; bedside echo today -complete post-op antimicrobials -pain control per protocol-- fentanyl  rather than morphine  givne renal dysfunction. Very sensitive to pain meds. -inotropic support per AHF- milrinone  0.25, epi 1-72mcg. NE if needed for MAP 70-80. -cleviprex  added for HTN; goal MAP 70-80 -post extubation progress diet as able after bedside swallow -mobility with post-op restrictions -con't iNO today  Post-op vent management;  PFTs with minimal pulmonary restriction likely not clinically significant. -SAT & SBT; extubated to [place] this afternoon -pulmonary hygiene -con't iNO today -goal spO2 >90%  Hyperglycemia; h/o DM. A1c 6.6 -this afternoon can transition off insulin  infusion to basal bolus  -goal BG <180  GERD -PPI & H2blocker  H/o migraine -family brought in PTA Ubrelvy   CKD 3b -strict I/O -renally dose meds, avid nephrotoxic meds -morphine   changed to fentanyl  to avoid accumulation of metabolites  Daughter updated at bedside during rounds.   Labs   CBC: Recent Labs  Lab 12/20/24 0428 12/23/24 0302 12/23/24 0754 12/23/24 1015 12/23/24 1032 12/23/24 1329 12/23/24 1850 12/23/24 1851 12/23/24 2000 12/24/24 0406 12/24/24 0523  WBC 12.9* 10.0  --   --   --  14.7*  --  14.3*  --  17.6*  --   NEUTROABS  --   --   --   --   --   --   --   --   --  13.6*  --   HGB 10.7* 11.6*   < > 7.6*   < > 8.9* 9.2* 9.2*  --  9.1* 9.2*  HCT 32.5* 35.5*   < > 22.5*   < > 26.0* 27.0* 27.2*  --  28.3* 27.0*  MCV 97.0 97.3  --   --   --  92.9  --  93.8  --  94.3  --   PLT 290 283  --  174  --  136*  --  170 182 202  --    < > = values in this interval not displayed.    Basic Metabolic Panel: Recent Labs  Lab 12/20/24 0428 12/21/24 0413 12/22/24 0456 12/23/24 0223 12/23/24 0601 12/23/24 0754 12/23/24 0958 12/23/24 1032 12/23/24 1057 12/23/24 1115 12/23/24 1118 12/23/24 1308 12/23/24 1850 12/23/24 1851 12/24/24 0406 12/24/24 0523  NA 135   < > 134* 134* 137   < > 138   < > 138 139   < > 140 139 138 140 139  K 4.4   < > 4.7 4.4 3.7   < > 4.9   < > 4.6 4.4   < > 3.6 4.8 4.9 4.9 4.6  CL 104   < > 103 101 111   < > 99  --  99 100  --   --   --  105 108  --   CO2 22   < > 22 22 17*  --   --   --   --   --   --   --   --  19* 19*  --   GLUCOSE 141*   < > 171* 182* 183*   < > 149*  --  167* 168*  --   --   --  209* 180*  --   BUN 14   < > 16 16 13    < > 12  --  14 14  --   --   --  12 12  --   CREATININE 1.55*   < > 1.56* 1.75* 1.36*   < > 1.40*  --  1.40* 1.30*  --   --   --  1.53* 1.52*  --   CALCIUM  9.3   < > 9.6 9.6 7.5*  --   --   --   --   --   --   --   --  8.5* 8.5*  --   MG 2.5*  --   --  2.4  --   --   --   --   --   --   --   --   --  3.3* 2.8*  --   PHOS  --   --   --   --   --   --   --   --   --   --   --   --   --   --  2.9  --    < > = values in this interval not displayed.   GFR: Estimated Creatinine Clearance:  34.9 mL/min (A) (by C-G formula based on SCr of 1.52 mg/dL (H)). Recent Labs  Lab 12/19/24 1831 12/19/24 1938 12/20/24 0428 12/23/24 0302 12/23/24 1329 12/23/24 1851 12/24/24 0406  WBC  --   --    < > 10.0 14.7* 14.3* 17.6*  LATICACIDVEN 0.8 0.9  --   --   --   --   --    < > = values in this interval not displayed.    Critical care time:     This patient is critically ill with multiple organ system failure which requires frequent high complexity decision making, assessment, support, evaluation, and titration of therapies. This was completed through the application of advanced monitoring technologies and extensive interpretation of multiple databases. During this encounter critical care time was devoted to patient care services described in this note for 65 minutes.   Leita [name], DO 12/24/24 2:04 PM [facility]  For contact information, see Amion. If no response to pager, please call PCCM 2H APP. After hours, 7PM- 7AM, please call on call APP for 2H.       "

## 2024-12-24 NOTE — Progress Notes (Signed)
 OT Cancellation Note  Patient Details Name: Karen Warner MRN: [id number] DOB: 1951-11-10   Cancelled Treatment:    Reason Eval/Treat Not Completed: Active bedrest order  Karen Warner Karen Warner, OTR/L [facility] Office: [phone]   Karen Warner 12/24/2024, 7:31 AM

## 2024-12-24 NOTE — Progress Notes (Signed)
 Night Critical Care and Cross coverage    Interval/subjective  Increasing FIO2 needs.  Poor cough mechanics.  Now on salter at 8 LPM. Reports pain w/ cough  Also febrile. Spiking to 101.3 w/ associated tachycardia (worse since earlier this am)    Objective   BP (!) 79/63   Pulse (!) 104   Temp 98.8 F (37.1 C)   Resp (!) 29   Ht 5' 5 (1.651 m)   Wt 84.4 kg   SpO2 93%   BMI 30.96 kg/m    PAP: (18-87)/(10-32) 19/10 CVP:  [0 mmHg-37 mmHg] 5 mmHg CO:  [3.4 L/min-4.4 L/min] 3.9 L/min CI:  [1.9 L/min/m2-2.5 L/min/m2] 2.2 L/min/m2  I/O last 3 completed shifts: In: 8288.9 [I.V.:4224.1; Blood:937; NG/GT:460; IV Piggyback:2667.8] Out: 2867 [Urine:1815; Chest Tube:1052] Total I/O In: 836 [I.V.:77.3; IV Piggyback:758.6] Out: 160 [Urine:60; Chest Tube:100]  Type of VAD VAD Type: HeartMate 3 Pump Speed (RPM): 4600 RPM Pump Speed (RPM): 4600 RPM Pump Flow (LPM): 2.6 Power (Watts): 3 Watts Pulsatility Index: 7.8   Exam General a little more anxious. Awake. Uncomfortable.  HENT NCAT  Pulm dec'd bases. Currently on 8 LPM/salter w/ sats 85-88% CXR prior to extubation already showing lower vol film w/ atx and vasc congestion. Cough mechanics not great post extubation. Remains on iNO 20 ppm Card tachy RRR.MAP currently at 70.  CVP 7,Has very tight BP goals of MAP 65 to 80. On both cleviprex  and NE as not tolerant of MAP > 80: current SVO2: 58% Ext still warm  Neuro intact  Most recent labs Abg 7.35/31/91/bicarb 17.4 Wbc 18.5 hgb 9.4 plt 198 Na 136 k 4 co2 18 w nml anion gap of 10 Cr 1.3 (down from 1.5)  Impression and plan    Post operative hypoxic respiratory failure  -think mostly driven by atelectasis but certainly at risk for evolving edema  Plan Cont to titrate FIO2 for sats > 92% Cont fixed dose iNO CXR now Check lactic acid and ABG If LA increased, sats still < 92% and her VO2 still lower will place her on NIPPV.   Acute on chronic HF 2/2 ICM. (End stage) now  s/p LVAD -still on lower speeds of 4600. Having trouble with low flow alarms. Seems volume responsive but also intolerant of MAP > 80  -no longer on Epi -svo2: ws in 40s now 58% after increasing to 10 liter salter Plan Cont LVAD per HF and cardiac surg Milrinone  at 0.375 Tight BP control requiring tight rope of Clevidipine  PRN NE for MAP >65 but <80  Cont tele   Fever and tachycardia w/ on-going leukocytosis  Suspect still post op SIRS and possibly atx related->already better after apap  Plan Am CBC Trend fever curve Tylenol  to decrease fever and decrease O2 needs     I personally  spent 32 minutes  on this patient which included: review of medical records, nursing notes, progress notes, evaluation, interpretation of lab data and diagnostic studies, taking independent history, performing exam, documenting plan, ordering diagnostics and interventions for the following critical care issues: Acute respiratory failure with the following interventions which included: titration of ventilatory support

## 2024-12-24 NOTE — Progress Notes (Signed)
 HeartMate 3 Rounding Note  Subjective:    Stable hemodynamics overnight but had low flow alarms when she woke up and MAP increased to 90's-100's. She reportedly tolerated it well.   Still intubated on Precedex . MAP 70's on milrinone  0.25, epi 3, NE 1.  CI 2, PA 26/18, CVP 12.  Co-ox 73.4   Very pulsatile arterial waveform.  LVAD INTERROGATION:  HeartMate IIl LVAD:  Flow 2.8 liters/min, speed 4600, power 3, PI 6.8.    Objective:    Vital Signs:   Temp:  [97.3 F (36.3 C)-100.9 F (38.3 C)] 100 F (37.8 C) (01/24 0709) Pulse Rate:  [30-209] 93 (01/24 0709) Resp:  [0-33] 21 (01/24 0709) BP: (79)/(63) 79/63 (01/23 1330) SpO2:  [68 %-100 %] 100 % (01/24 0709) Arterial Line BP: (70-353)/(46-212) 89/68 (01/24 0709) FiO2 (%):  [50 %] 50 % (01/24 0400) Weight:  [84.4 kg] 84.4 kg (01/24 0500) Last BM Date : 12/19/24 Mean arterial Pressure 77  Intake/Output:   Intake/Output Summary (Last 24 hours) at 12/24/2024 0715 Last data filed at 12/24/2024 9286 Gross per 24 hour  Intake 7093.89 ml  Output 2192 ml  Net 4901.89 ml     Physical Exam: General:  intubated and sedated Cor: Distant heart sounds with LVAD hum present. Lungs: clear Abdomen: soft, nontender, nondistended. No bowel sounds. Extremities: mild edema Neuro: sedated but reportedly woke up and follows commands, moves all ext.  Telemetry: internally paced 90's  Labs: Basic Metabolic Panel: Recent Labs  Lab 12/20/24 0428 12/21/24 0413 12/22/24 0456 12/23/24 0223 12/23/24 0601 12/23/24 0754 12/23/24 0958 12/23/24 1032 12/23/24 1057 12/23/24 1115 12/23/24 1118 12/23/24 1308 12/23/24 1850 12/23/24 1851 12/24/24 0406 12/24/24 0523  NA 135   < > 134* 134* 137   < > 138   < > 138 139   < > 140 139 138 140 139  K 4.4   < > 4.7 4.4 3.7   < > 4.9   < > 4.6 4.4   < > 3.6 4.8 4.9 4.9 4.6  CL 104   < > 103 101 111   < > 99  --  99 100  --   --   --  105 108  --   CO2 22   < > 22 22 17*  --   --   --   --   --   --    --   --  19* 19*  --   GLUCOSE 141*   < > 171* 182* 183*   < > 149*  --  167* 168*  --   --   --  209* 180*  --   BUN 14   < > 16 16 13    < > 12  --  14 14  --   --   --  12 12  --   CREATININE 1.55*   < > 1.56* 1.75* 1.36*   < > 1.40*  --  1.40* 1.30*  --   --   --  1.53* 1.52*  --   CALCIUM  9.3   < > 9.6 9.6 7.5*  --   --   --   --   --   --   --   --  8.5* 8.5*  --   MG 2.5*  --   --  2.4  --   --   --   --   --   --   --   --   --  3.3* 2.8*  --   PHOS  --   --   --   --   --   --   --   --   --   --   --   --   --   --  2.9  --    < > = values in this interval not displayed.    Liver Function Tests: Recent Labs  Lab 12/19/24 1832 12/23/24 0223 12/24/24 0406  AST 18 19 117*  ALT 8 8 12   ALKPHOS 54 62 52  BILITOT 0.3 0.3 0.6  PROT 6.2* 7.3 5.9*  ALBUMIN  3.3* 4.0 3.8   No results for input(s): LIPASE, AMYLASE in the last 168 hours. No results for input(s): AMMONIA in the last 168 hours.  CBC: Recent Labs  Lab 12/20/24 0428 12/23/24 0302 12/23/24 0754 12/23/24 1015 12/23/24 1032 12/23/24 1329 12/23/24 1850 12/23/24 1851 12/23/24 2000 12/24/24 0406 12/24/24 0523  WBC 12.9* 10.0  --   --   --  14.7*  --  14.3*  --  17.6*  --   NEUTROABS  --   --   --   --   --   --   --   --   --  13.6*  --   HGB 10.7* 11.6*   < > 7.6*   < > 8.9* 9.2* 9.2*  --  9.1* 9.2*  HCT 32.5* 35.5*   < > 22.5*   < > 26.0* 27.0* 27.2*  --  28.3* 27.0*  MCV 97.0 97.3  --   --   --  92.9  --  93.8  --  94.3  --   PLT 290 283  --  174  --  136*  --  170 182 202  --    < > = values in this interval not displayed.    INR: Recent Labs  Lab 12/22/24 1837 12/23/24 1329 12/23/24 2000 12/24/24 0406  INR 1.0 1.3* 1.3* 1.3*    Other results: EKG:   Imaging: DG Chest Port 1 View Result Date: 12/23/2024 CLINICAL DATA:  Left ventricular assist device EXAM: PORTABLE CHEST 1 VIEW COMPARISON:  December 14, 2024. FINDINGS: There is been interval placement of left ventricular assist device.  Left-sided defibrillator is again noted. Endotracheal and nasogastric tubes are in good position. Probable left basilar atelectasis and possible small pleural effusion may be present. Left-sided chest tube is noted. No pneumothorax. IMPRESSION: Interval placement of left ventricular assist device. Probable left basilar atelectasis and possible small pleural effusion. Electronically Signed   By: Lynwood Landy Raddle M.D.   On: 12/23/2024 14:15     Medications:     Scheduled Medications:  acetaminophen   1,000 mg Oral Q6H   Or   acetaminophen  (TYLENOL ) oral liquid 160 mg/5 mL  1,000 mg Per Tube Q6H   bisacodyl   10 mg Oral Daily   Or   bisacodyl   10 mg Rectal Daily   Chlorhexidine  Gluconate Cloth  6 each Topical Daily   docusate sodium   200 mg Oral Daily   fentaNYL  (SUBLIMAZE ) injection  25-50 mcg Intravenous Once   gabapentin   200 mg Oral QHS   metoCLOPramide  (REGLAN ) injection  10 mg Intravenous Q6H   mouth rinse  15 mL Mouth Rinse Q2H   [START ON 12/25/2024] pantoprazole   40 mg Oral Daily   polyethylene glycol  17 g Per Tube Daily   senna  1 tablet Per Tube BID   sodium chloride  flush  3 mL  Intravenous Q12H   valACYclovir   500 mg Oral Daily   Vitamin D  (Ergocalciferol )  50,000 Units Oral Q Sun   vortioxetine  HBr  5 mg Oral QHS    Infusions:  sodium chloride      sodium chloride      sodium chloride  Stopped (12/23/24 1358)   albumin  human Stopped (12/23/24 1859)    ceFAZolin  (ANCEF ) IV Stopped (12/24/24 0539)   dexmedetomidine  (PRECEDEX ) IV infusion 0.6 mcg/kg/hr (12/24/24 0713)   epinephrine  3 mcg/min (12/24/24 0713)   fentaNYL  infusion INTRAVENOUS     fluconazole  (DIFLUCAN ) IV 100 mL/hr at 12/24/24 0713   insulin  4 Units/hr (12/24/24 0713)   lactated ringers      lactated ringers  20 mL/hr at 12/24/24 0713   milrinone  0.25 mcg/kg/min (12/24/24 0713)   norepinephrine  (LEVOPHED ) Adult infusion 1 mcg/min (12/24/24 0713)   rifampin  (RIFADIN ) 600 mg in sodium chloride  0.9 % 100 mL IVPB      vancomycin  Stopped (12/23/24 2156)    PRN Medications: sodium chloride , albumin  human, dextrose , fentaNYL , fentaNYL  (SUBLIMAZE ) injection, midazolam  PF, ondansetron  (ZOFRAN ) IV, mouth rinse, sodium chloride  flush, Ubrogepant   CXR: ok  Assessment:   POD 1 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 Normal RV. Preop LVEF 20-25%. She has CRT-D  device and Barostim.  CAD with stable coronary anatomy per recent cath.  COPD with previous smoking until 2021.  Stage 3 CKD with creat around 1.5-1.6 preop.  Preop RUQ and epigastric abdominal pain with no evidence of acute cholecystitis on US  . Seen by GI. Felt to possibly be related to low CO.  Obesity.   Plan/Discussion:    Low flow alarms due to elevated MAP when she woke up and probably also reduced preload from breathing against vent. She tolerated this well and has very pulsatile arterial tracing. Plan to wake her up and wean to extubated. Will have to tolerate low flow alarms during the process. I think she will probably tolerate this ok since her RV is good and LV is providing pulsatile flow.  Keep chest tubes in.  Plan to remove pacing wires in am and start Coumadin  tomorrow.  I reviewed the LVAD parameters from today, and compared the results to the patient's prior recorded data.  No programming changes were made.  The LVAD is functioning within specified parameters.    Length of Stay: [address]  Dorise POUR [facility] 12/24/2024, 7:15 AM

## 2024-12-24 NOTE — Progress Notes (Signed)
 "    Advanced Heart Failure Rounding Note  Cardiologist: Dub Huntsman, DO  [facility] Cardiologist: Dr Rolan  Chief Complaint: Post Op HMIII LVAD  Patient Profile   Karen Warner is a 74 y.o. female with history of CAD and ischemic cardiomyopathy, EF <20%, CKD III and COPD admitted w/ a/c CHF, chest/abdominal pain. RHC c/w low output.   Significant events:   1/15: RHC (RA 2, PA 21/10, PCW 3,  PA sat 59%, TD CI 1.49, FICK CI 1.88, PAPi 5.5), started on milrinone            LHC patent stents, nonobstructive CAD   1/19: co-ox persistently in 40s, milrinone  increased 0.375 12/23/24: S/P HMIII Implant.Intra-op speed placed at 4700 due to low filling pressures.  Received 2UPRBC, 3 FFP, cellsaver, 1 albumin , and 4 FFP    Subjective:   Still weaning off sedation, hopeful extubation today.  Low flows, a combination of MAP, low LVAD speed, and volume status. Bedside echo shows severely decreased LV function, normal size and RV function, at odds to elevated CVP and reduced PAPi.  Suspect will be able to increase speed slowly over the coming days, given low filling pressures is extra sensitive to increase in speed.   Objective:   Weight Range: 84.4 kg Body mass index is 30.96 kg/m.   Vital Signs:   Temp:  [97.3 F (36.3 C)-100.9 F (38.3 C)] 99.9 F (37.7 C) (01/24 1100) Pulse Rate:  [30-224] 211 (01/24 1100) Resp:  [0-33] 19 (01/24 1100) BP: (79)/(63) 79/63 (01/23 1330) SpO2:  [68 %-100 %] 78 % (01/24 1100) Arterial Line BP: (70-353)/(46-212) 90/71 (01/24 1100) FiO2 (%):  [50 %] 50 % (01/24 0928) Weight:  [84.4 kg] 84.4 kg (01/24 0500) Last BM Date : 12/19/24  Weight change: Filed Weights   12/23/24 0107 12/23/24 0634 12/24/24 0500  Weight: 73.8 kg 73.8 kg 84.4 kg    Intake/Output:   Intake/Output Summary (Last 24 hours) at 12/24/2024 1147 Last data filed at 12/24/2024 1100 Gross per 24 hour  Intake 5316.35 ml  Output 1957 ml  Net 3359.35 ml     Physical Exam    Physical Exam: General:  Intubated Cor: Mechanical heart sounds with LVAD hum present. JVP flat, trace edema Lungs: Ventilated breath sounds Abdomen: soft, nontender, nondistended.  Driveline: C/D/I; securement device intact and driveline incorporated Neuro: sedated, but awakens eyes to voice, follows commands   Telemetry   SR  Labs   CBC Recent Labs    12/23/24 1851 12/23/24 2000 12/24/24 0406 12/24/24 0523  WBC 14.3*  --  17.6*  --   NEUTROABS  --   --  13.6*  --   HGB 9.2*  --  9.1* 9.2*  HCT 27.2*  --  28.3* 27.0*  MCV 93.8  --  94.3  --   PLT 170 182 202  --    Basic Metabolic Panel Recent Labs    98/76/73 1851 12/24/24 0406 12/24/24 0523  NA 138 140 139  K 4.9 4.9 4.6  CL 105 108  --   CO2 19* 19*  --   GLUCOSE 209* 180*  --   BUN 12 12  --   CREATININE 1.53* 1.52*  --   CALCIUM  8.5* 8.5*  --   MG 3.3* 2.8*  --   PHOS  --  2.9  --    Liver Function Tests Recent Labs    12/23/24 0223 12/24/24 0406  AST 19 117*  ALT 8 12  ALKPHOS  62 52  BILITOT 0.3 0.6  PROT 7.3 5.9*  ALBUMIN  4.0 3.8   No results for input(s): LIPASE, AMYLASE in the last 72 hours. Cardiac Enzymes No results for input(s): CKTOTAL, CKMB, CKMBINDEX, TROPONINI in the last 72 hours.  BNP: BNP (last 3 results) Recent Labs    09/01/24 1457  BNP 169.4*    ProBNP (last 3 results) Recent Labs    12/13/24 1529 12/14/24 0826 12/24/24 0406  PROBNP 1,342.0* 1,539.0* 3,345.0*     D-Dimer Recent Labs    12/23/24 2000  DDIMER 0.98*   Hemoglobin A1C No results for input(s): HGBA1C in the last 72 hours. Fasting Lipid Panel No results for input(s): CHOL, HDL, LDLCALC, TRIG, CHOLHDL, LDLDIRECT in the last 72 hours. Medications:   Scheduled Medications:  acetaminophen   1,000 mg Oral Q6H   Or   acetaminophen  (TYLENOL ) oral liquid 160 mg/5 mL  1,000 mg Per Tube Q6H   bisacodyl   10 mg Oral Daily   Or   bisacodyl   10 mg Rectal Daily    Chlorhexidine  Gluconate Cloth  6 each Topical Daily   docusate sodium   200 mg Oral Daily   gabapentin   200 mg Oral QHS   metoCLOPramide  (REGLAN ) injection  10 mg Intravenous Q6H   mouth rinse  15 mL Mouth Rinse Q2H   [START ON 12/25/2024] pantoprazole   40 mg Oral Daily   polyethylene glycol  17 g Per Tube Daily   senna  1 tablet Per Tube BID   sodium chloride  flush  3 mL Intravenous Q12H   valACYclovir   500 mg Oral Daily   Vitamin D  (Ergocalciferol )  50,000 Units Oral Q Sun   vortioxetine  HBr  5 mg Oral QHS    Infusions:  sodium chloride      sodium chloride      sodium chloride  Stopped (12/23/24 1358)   albumin  human Stopped (12/23/24 1859)    ceFAZolin  (ANCEF ) IV Stopped (12/24/24 0539)   clevidipine  Stopped (12/24/24 9148)   dexmedetomidine  (PRECEDEX ) IV infusion Stopped (12/24/24 9166)   epinephrine  2 mcg/min (12/24/24 0800)   fentaNYL  infusion INTRAVENOUS     insulin  4 Units/hr (12/24/24 1100)   lactated ringers      lactated ringers  20 mL/hr at 12/24/24 1100   milrinone  0.25 mcg/kg/min (12/24/24 1100)   norepinephrine  (LEVOPHED ) Adult infusion 1 mcg/min (12/24/24 1100)   vancomycin  Stopped (12/24/24 1045)    PRN Medications: sodium chloride , albumin  human, dextrose , fentaNYL , fentaNYL  (SUBLIMAZE ) injection, HYDROmorphone , midazolam  PF, ondansetron  (ZOFRAN ) IV, mouth rinse, sodium chloride  flush, Ubrogepant    HeartMate 3 VAD Equipment Check Pump Speed (RPM): 4600 RPM HeartMate 3 VAD Equipment Check Pump Speed (RPM): 4600 RPM Pump Flow (LPM): 2.9 Power (Watts): 3 Watts Pulsatility Index: 6.1 Fixed Speed Limit: 4600 rpm Low Speed Limit: 4300 rpm Alarms: No alarms Auscultated: Normal expected humming Power Module Self-Test (Daily): Done System Controller Self-Test: Passed Patient Battery Source: [facility] / Wall unit Emergency Equipment at Bedside: Yes   Assessment/Plan   1. S/P LVAD HMIII Intra Op Speed 4600 due to low filling pressures.  Currently on milrinone  0.25,  epi 3, and clevidipine  of 1 CO-OX 73 CCM following for extubation.  Tight BP control, continue inhaled nitric post extubation No diuresis today, will need to start tomorrow likely May be able to increase speed post extubation pending   2.  Acute on chronic systolic CHF: Ischemic cardiomyopathy.  [employer] CRT-D device  - She is end-stage NYHA IV. MRB approved LVAD for Destination Therapy.  -As above S/P HMIII LVAD.  Intra-op speed at 4600 as above    3. CAD: PCI to mid/distal LAD in 2020 and proximal LAD in 2021.   Cath this admit showed stable coronary anatomy. Suspect CP related to HF as above - Off ranolazine , imdur , plavix  - Restart Crestor  + Zetia  in a few days.    4. COPD: History of smoking, quit 2021.    5. CKD 3: Follows with nephology at [facility].   6. Obesity: Has been on semaglutide.Now on hold.    7. PAD: Moderately decreased ABI on right in 7/25. Medical mgmt per Dr. Serene. Again ABI on 1/19, the R had moderate disease, and mild on the L. No claudication or other PAD symptoms.   8. GI: RUQ and epigastric pain.  No evidence by US  for acute cholecystitis. Likely related to low output. - seen by GI.    CRITICAL CARE Performed by: Morene JINNY Brownie   Total critical care time: 45 minutes  Critical care time was exclusive of separately billable procedures and treating other patients.  Critical care was necessary to treat or prevent imminent or life-threatening deterioration.  Critical care was time spent personally by me on the following activities: development of treatment plan with patient and/or surrogate as well as nursing, discussions with consultants, evaluation of patient's response to treatment, examination of patient, obtaining history from patient or surrogate, ordering and performing treatments and interventions, ordering and review of laboratory studies, ordering and review of radiographic studies, pulse oximetry and re-evaluation of patient's  condition.   Length of Stay: 9  Morene JINNY Brownie, MD  12/24/2024, 11:47 AM  Advanced Heart Failure Team Pager [phone] (M-F; 7a - 5p)   Please visit [email or web address]: For overnight coverage please call cardiology fellow first. If fellow not available call Shock/ECMO MD on call.  For ECMO / Mechanical Support (Impella, IABP, LVAD) issues call Shock / ECMO MD on call.     "

## 2024-12-24 NOTE — Progress Notes (Incomplete)
 Night Critical Care and Cross coverage    Interval/subjective    Objective   BP (!) 79/63   Pulse (!) 104   Temp 98.8 F (37.1 C)   Resp (!) 29   Ht 5' 5 (1.651 m)   Wt 84.4 kg   SpO2 93%   BMI 30.96 kg/m    PAP: (18-87)/(10-32) 19/10 CVP:  [0 mmHg-37 mmHg] 5 mmHg CO:  [3.4 L/min-4.4 L/min] 3.9 L/min CI:  [1.9 L/min/m2-2.5 L/min/m2] 2.2 L/min/m2  I/O last 3 completed shifts: In: 8288.9 [I.V.:4224.1; Blood:937; NG/GT:460; IV Piggyback:2667.8] Out: 2867 [Urine:1815; Chest Tube:1052] Total I/O In: 836 [I.V.:77.3; IV Piggyback:758.6] Out: 160 [Urine:60; Chest Tube:100]  Type of VAD VAD Type: HeartMate 3 Pump Speed (RPM): 4600 RPM Pump Speed (RPM): 4600 RPM Pump Flow (LPM): 2.6 Power (Watts): 3 Watts Pulsatility Index: 7.8     Exam   Impression and plan

## 2024-12-24 NOTE — Procedures (Signed)
 Extubation Procedure Note  Patient Details:   Name: Karen Warner DOB: 23-Dec-1950 MRN: [id number]   Airway Documentation:    Vent end date: 12/24/24 Vent end time: 1335   Evaluation  O2 sats: stable throughout Complications: No apparent complications Patient did tolerate procedure well. Bilateral Breath Sounds: Rhonchi, Diminished   Yes  RT extubated patient to 6L [place] with nitric at 20ppm per MD order with RN at bedside. Positive cuff leak noted. Patient tolerating fairly well at this time. MD made aware of sats dipping to 88% at times. Overall no distress or stridor noted at this time. RT will continue to monitor.   Paulla ONEIDA Gaskins 12/24/2024, 1:47 PM

## 2024-12-24 NOTE — Progress Notes (Signed)
 Low flow alarms since she was EOB. PI ~10.  PAPI 1 Giving albumin  x 2, increased milrinone  to 0.375 after discussion with AHF.  Leita [name], DO 12/24/24 6:55 PM [facility]

## 2024-12-25 ENCOUNTER — Inpatient Hospital Stay (HOSPITAL_COMMUNITY)

## 2024-12-25 DIAGNOSIS — J9601 Acute respiratory failure with hypoxia: Secondary | ICD-10-CM | POA: Diagnosis not present

## 2024-12-25 DIAGNOSIS — J9611 Chronic respiratory failure with hypoxia: Secondary | ICD-10-CM

## 2024-12-25 DIAGNOSIS — D72829 Elevated white blood cell count, unspecified: Secondary | ICD-10-CM

## 2024-12-25 DIAGNOSIS — I5023 Acute on chronic systolic (congestive) heart failure: Secondary | ICD-10-CM | POA: Diagnosis not present

## 2024-12-25 DIAGNOSIS — I255 Ischemic cardiomyopathy: Secondary | ICD-10-CM | POA: Diagnosis not present

## 2024-12-25 DIAGNOSIS — K219 Gastro-esophageal reflux disease without esophagitis: Secondary | ICD-10-CM | POA: Diagnosis not present

## 2024-12-25 DIAGNOSIS — N1832 Chronic kidney disease, stage 3b: Secondary | ICD-10-CM | POA: Diagnosis not present

## 2024-12-25 DIAGNOSIS — R739 Hyperglycemia, unspecified: Secondary | ICD-10-CM | POA: Diagnosis not present

## 2024-12-25 LAB — POCT I-STAT 7, (LYTES, BLD GAS, ICA,H+H)
Acid-base deficit: 7 mmol/L — ABNORMAL HIGH (ref 0.0–2.0)
Acid-base deficit: 8 mmol/L — ABNORMAL HIGH (ref 0.0–2.0)
Bicarbonate: 16.7 mmol/L — ABNORMAL LOW (ref 20.0–28.0)
Bicarbonate: 17.8 mmol/L — ABNORMAL LOW (ref 20.0–28.0)
Calcium, Ion: 1.11 mmol/L — ABNORMAL LOW (ref 1.15–1.40)
Calcium, Ion: 1.16 mmol/L (ref 1.15–1.40)
HCT: 23 % — ABNORMAL LOW (ref 36.0–46.0)
HCT: 26 % — ABNORMAL LOW (ref 36.0–46.0)
Hemoglobin: 7.8 g/dL — ABNORMAL LOW (ref 12.0–15.0)
Hemoglobin: 8.8 g/dL — ABNORMAL LOW (ref 12.0–15.0)
O2 Saturation: 88 %
O2 Saturation: 93 %
Patient temperature: 36.9
Patient temperature: 37.1
Potassium: 3.9 mmol/L (ref 3.5–5.1)
Potassium: 4 mmol/L (ref 3.5–5.1)
Sodium: 135 mmol/L (ref 135–145)
Sodium: 138 mmol/L (ref 135–145)
TCO2: 18 mmol/L — ABNORMAL LOW (ref 22–32)
TCO2: 19 mmol/L — ABNORMAL LOW (ref 22–32)
pCO2 arterial: 29.5 mmHg — ABNORMAL LOW (ref 32–48)
pCO2 arterial: 30.7 mmHg — ABNORMAL LOW (ref 32–48)
pH, Arterial: 7.36 (ref 7.35–7.45)
pH, Arterial: 7.372 (ref 7.35–7.45)
pO2, Arterial: 55 mmHg — ABNORMAL LOW (ref 83–108)
pO2, Arterial: 67 mmHg — ABNORMAL LOW (ref 83–108)

## 2024-12-25 LAB — COMPREHENSIVE METABOLIC PANEL WITH GFR
ALT: 11 U/L (ref 0–44)
AST: 123 U/L — ABNORMAL HIGH (ref 15–41)
Albumin: 3.9 g/dL (ref 3.5–5.0)
Alkaline Phosphatase: 61 U/L (ref 38–126)
Anion gap: 10 (ref 5–15)
BUN: 11 mg/dL (ref 8–23)
CO2: 19 mmol/L — ABNORMAL LOW (ref 22–32)
Calcium: 8.4 mg/dL — ABNORMAL LOW (ref 8.9–10.3)
Chloride: 106 mmol/L (ref 98–111)
Creatinine, Ser: 1.15 mg/dL — ABNORMAL HIGH (ref 0.44–1.00)
GFR, Estimated: 50 mL/min — ABNORMAL LOW
Glucose, Bld: 181 mg/dL — ABNORMAL HIGH (ref 70–99)
Potassium: 4.3 mmol/L (ref 3.5–5.1)
Sodium: 136 mmol/L (ref 135–145)
Total Bilirubin: 1.6 mg/dL — ABNORMAL HIGH (ref 0.0–1.2)
Total Protein: 6.1 g/dL — ABNORMAL LOW (ref 6.5–8.1)

## 2024-12-25 LAB — COOXEMETRY PANEL
Carboxyhemoglobin: 1.7 % — ABNORMAL HIGH (ref 0.5–1.5)
Methemoglobin: <0.7 % (ref 0.0–1.5)
O2 Saturation: 63.9 %
Total hemoglobin: 8.9 g/dL — ABNORMAL LOW (ref 12.0–16.0)

## 2024-12-25 LAB — CBC WITH DIFFERENTIAL/PLATELET
Abs Immature Granulocytes: 0.28 10*3/uL — ABNORMAL HIGH (ref 0.00–0.07)
Basophils Absolute: 0.1 10*3/uL (ref 0.0–0.1)
Basophils Relative: 0 %
Eosinophils Absolute: 0 10*3/uL (ref 0.0–0.5)
Eosinophils Relative: 0 %
HCT: 25.7 % — ABNORMAL LOW (ref 36.0–46.0)
Hemoglobin: 8.5 g/dL — ABNORMAL LOW (ref 12.0–15.0)
Immature Granulocytes: 1 %
Lymphocytes Relative: 5 %
Lymphs Abs: 1.2 10*3/uL (ref 0.7–4.0)
MCH: 31.8 pg (ref 26.0–34.0)
MCHC: 33.1 g/dL (ref 30.0–36.0)
MCV: 96.3 fL (ref 80.0–100.0)
Monocytes Absolute: 2.2 10*3/uL — ABNORMAL HIGH (ref 0.1–1.0)
Monocytes Relative: 9 %
Neutro Abs: 19.9 10*3/uL — ABNORMAL HIGH (ref 1.7–7.7)
Neutrophils Relative %: 85 %
Platelets: 182 10*3/uL (ref 150–400)
RBC: 2.67 MIL/uL — ABNORMAL LOW (ref 3.87–5.11)
RDW: 17.1 % — ABNORMAL HIGH (ref 11.5–15.5)
WBC: 23.6 10*3/uL — ABNORMAL HIGH (ref 4.0–10.5)
nRBC: 0.3 % — ABNORMAL HIGH (ref 0.0–0.2)

## 2024-12-25 LAB — GLUCOSE, CAPILLARY
Glucose-Capillary: 189 mg/dL — ABNORMAL HIGH (ref 70–99)
Glucose-Capillary: 173 mg/dL — ABNORMAL HIGH (ref 70–99)
Glucose-Capillary: 193 mg/dL — ABNORMAL HIGH (ref 70–99)
Glucose-Capillary: 163 mg/dL — ABNORMAL HIGH (ref 70–99)
Glucose-Capillary: 185 mg/dL — ABNORMAL HIGH (ref 70–99)
Glucose-Capillary: 174 mg/dL — ABNORMAL HIGH (ref 70–99)

## 2024-12-25 LAB — BASIC METABOLIC PANEL WITH GFR
Anion gap: 13 (ref 5–15)
BUN: 19 mg/dL (ref 8–23)
CO2: 19 mmol/L — ABNORMAL LOW (ref 22–32)
Calcium: 8.1 mg/dL — ABNORMAL LOW (ref 8.9–10.3)
Chloride: 107 mmol/L (ref 98–111)
Creatinine, Ser: 1.38 mg/dL — ABNORMAL HIGH (ref 0.44–1.00)
GFR, Estimated: 40 mL/min — ABNORMAL LOW
Glucose, Bld: 195 mg/dL — ABNORMAL HIGH (ref 70–99)
Potassium: 3.9 mmol/L (ref 3.5–5.1)
Sodium: 138 mmol/L (ref 135–145)

## 2024-12-25 LAB — PROTIME-INR
INR: 1.5 — ABNORMAL HIGH (ref 0.8–1.2)
Prothrombin Time: 19.3 s — ABNORMAL HIGH (ref 11.4–15.2)

## 2024-12-25 LAB — PHOSPHORUS: Phosphorus: 2 mg/dL — ABNORMAL LOW (ref 2.5–4.6)

## 2024-12-25 LAB — MAGNESIUM
Magnesium: 2.4 mg/dL (ref 1.7–2.4)
Magnesium: 2.6 mg/dL — ABNORMAL HIGH (ref 1.7–2.4)

## 2024-12-25 LAB — LACTIC ACID, PLASMA
Lactic Acid, Venous: 3.9 mmol/L (ref 0.5–1.9)
Lactic Acid, Venous: 1.7 mmol/L (ref 0.5–1.9)

## 2024-12-25 LAB — BLOOD GAS, ARTERIAL
Acid-base deficit: 7 mmol/L — ABNORMAL HIGH (ref 0.0–2.0)
Bicarbonate: 17.8 mmol/L — ABNORMAL LOW (ref 20.0–28.0)
Drawn by: 35849
O2 Saturation: 100 %
Patient temperature: 37
pCO2 arterial: 33 mmHg (ref 32–48)
pH, Arterial: 7.34 — ABNORMAL LOW (ref 7.35–7.45)
pO2, Arterial: 281 mmHg — ABNORMAL HIGH (ref 83–108)

## 2024-12-25 LAB — CG4 I-STAT (LACTIC ACID): Lactic Acid, Venous: 0.9 mmol/L (ref 0.5–1.9)

## 2024-12-25 LAB — LACTATE DEHYDROGENASE: LDH: 529 U/L — ABNORMAL HIGH (ref 105–235)

## 2024-12-25 MED ORDER — PIVOT 1.5 CAL PO LIQD
1000.0000 mL | ORAL | Status: DC
  Administered 2024-12-25 – 2024-12-27 (×3): 1000 mL

## 2024-12-25 MED ORDER — MIDAZOLAM HCL (PF) 2 MG/2ML IJ SOLN
4.0000 mg | Freq: Once | INTRAMUSCULAR | Status: AC
  Administered 2024-12-25: 4 mg via INTRAVENOUS

## 2024-12-25 MED ORDER — SODIUM PHOSPHATES 45 MMOLE/15ML IV SOLN
15.0000 mmol | Freq: Once | INTRAVENOUS | Status: AC
  Administered 2024-12-25: 15 mmol via INTRAVENOUS
  Filled 2024-12-25: qty 5

## 2024-12-25 MED ORDER — LIDOCAINE 5 % EX PTCH
1.0000 | MEDICATED_PATCH | CUTANEOUS | Status: DC
  Filled 2024-12-25: qty 1

## 2024-12-25 MED ORDER — NOREPINEPHRINE 4 MG/250ML-% IV SOLN
INTRAVENOUS | Status: AC
  Filled 2024-12-25: qty 250

## 2024-12-25 MED ORDER — FENTANYL CITRATE (PF) 50 MCG/ML IJ SOSY: 25.0000 ug | PREFILLED_SYRINGE | Freq: Once | INTRAMUSCULAR | Status: AC

## 2024-12-25 MED ORDER — DEXMEDETOMIDINE HCL IN NACL 400 MCG/100ML IV SOLN
0.0000 ug/kg/h | INTRAVENOUS | Status: DC
  Administered 2024-12-25 – 2024-12-26 (×6): 0.7 ug/kg/h via INTRAVENOUS
  Administered 2024-12-27 (×2): 1.1 ug/kg/h via INTRAVENOUS
  Filled 2024-12-25 (×6): qty 100
  Filled 2024-12-25: qty 200

## 2024-12-25 MED ORDER — ALBUMIN HUMAN 5 % IV SOLN
INTRAVENOUS | Status: AC
  Filled 2024-12-25: qty 250

## 2024-12-25 MED ORDER — PHENYLEPHRINE 80 MCG/ML (10ML) SYRINGE FOR IV PUSH (FOR BLOOD PRESSURE SUPPORT)
PREFILLED_SYRINGE | INTRAVENOUS | Status: AC
  Administered 2024-12-25: 160 ug
  Filled 2024-12-25: qty 10

## 2024-12-25 MED ORDER — FENTANYL 2500MCG IN NS 250ML (10MCG/ML) PREMIX INFUSION
0.0000 ug/h | INTRAVENOUS | Status: DC
  Administered 2024-12-25 – 2024-12-26 (×2): 100 ug/h via INTRAVENOUS
  Administered 2024-12-27: 125 ug/h via INTRAVENOUS
  Filled 2024-12-25 (×3): qty 250

## 2024-12-25 MED ORDER — ROCURONIUM BROMIDE 10 MG/ML (PF) SYRINGE
100.0000 mg | PREFILLED_SYRINGE | Freq: Once | INTRAVENOUS | Status: AC
  Administered 2024-12-25: 100 mg via INTRAVENOUS

## 2024-12-25 MED ORDER — SODIUM BICARBONATE 8.4 % IV SOLN
50.0000 meq | Freq: Once | INTRAVENOUS | Status: AC
  Administered 2024-12-25: 50 meq via INTRAVENOUS
  Filled 2024-12-25: qty 50

## 2024-12-25 MED ORDER — SUCCINYLCHOLINE CHLORIDE 200 MG/10ML IV SOSY
PREFILLED_SYRINGE | INTRAVENOUS | Status: AC
  Filled 2024-12-25: qty 10

## 2024-12-25 MED ORDER — FENTANYL CITRATE (PF) 50 MCG/ML IJ SOSY
100.0000 ug | PREFILLED_SYRINGE | Freq: Once | INTRAMUSCULAR | Status: AC
  Administered 2024-12-25: 100 ug via INTRAVENOUS

## 2024-12-25 MED ORDER — REVEFENACIN 175 MCG/3ML IN SOLN
175.0000 ug | Freq: Every day | RESPIRATORY_TRACT | Status: AC
  Administered 2024-12-25 – 2025-01-06 (×11): 175 ug via RESPIRATORY_TRACT
  Filled 2024-12-25 (×12): qty 3

## 2024-12-25 MED ORDER — EPINEPHRINE HCL 5 MG/250ML IV SOLN IN NS
0.5000 ug/min | INTRAVENOUS | Status: DC
  Administered 2024-12-25: 1 ug/min via INTRAVENOUS
  Filled 2024-12-25: qty 250

## 2024-12-25 MED ORDER — NOREPINEPHRINE 4 MG/250ML-% IV SOLN
0.0000 ug/min | INTRAVENOUS | Status: DC
  Administered 2024-12-26: 5 ug/min via INTRAVENOUS
  Filled 2024-12-25: qty 250

## 2024-12-25 MED ORDER — METHOCARBAMOL 500 MG PO TABS
500.0000 mg | ORAL_TABLET | Freq: Three times a day (TID) | ORAL | Status: DC
  Administered 2024-12-25 – 2024-12-26 (×4): 500 mg via ORAL
  Filled 2024-12-25 (×4): qty 1

## 2024-12-25 MED ORDER — VANCOMYCIN HCL IN DEXTROSE 1-5 GM/200ML-% IV SOLN
1000.0000 mg | INTRAVENOUS | Status: DC
  Administered 2024-12-26 – 2025-01-01 (×7): 1000 mg via INTRAVENOUS
  Filled 2024-12-25 (×7): qty 200

## 2024-12-25 MED ORDER — MIDAZOLAM HCL 2 MG/2ML IJ SOLN
INTRAMUSCULAR | Status: AC
  Filled 2024-12-25: qty 2

## 2024-12-25 MED ORDER — FENTANYL CITRATE (PF) 50 MCG/ML IJ SOSY
PREFILLED_SYRINGE | INTRAMUSCULAR | Status: AC
  Filled 2024-12-25: qty 2

## 2024-12-25 MED ORDER — ROCURONIUM BROMIDE 10 MG/ML (PF) SYRINGE
PREFILLED_SYRINGE | INTRAVENOUS | Status: AC
  Filled 2024-12-25: qty 10

## 2024-12-25 MED ORDER — FUROSEMIDE 10 MG/ML IJ SOLN
40.0000 mg | Freq: Once | INTRAMUSCULAR | Status: AC
  Administered 2024-12-25: 40 mg via INTRAVENOUS
  Filled 2024-12-25: qty 4

## 2024-12-25 MED ORDER — INSULIN GLARGINE 100 UNIT/ML ~~LOC~~ SOLN
15.0000 [IU] | Freq: Every day | SUBCUTANEOUS | Status: DC
  Administered 2024-12-25 – 2024-12-28 (×4): 15 [IU] via SUBCUTANEOUS
  Filled 2024-12-25 (×4): qty 0.15

## 2024-12-25 MED ORDER — VANCOMYCIN HCL 1750 MG/350ML IV SOLN
1750.0000 mg | Freq: Once | INTRAVENOUS | Status: AC
  Administered 2024-12-25: 1750 mg via INTRAVENOUS
  Filled 2024-12-25: qty 350

## 2024-12-25 MED ORDER — ETOMIDATE 2 MG/ML IV SOLN
INTRAVENOUS | Status: AC
  Filled 2024-12-25: qty 20

## 2024-12-25 MED ORDER — ARFORMOTEROL TARTRATE 15 MCG/2ML IN NEBU
15.0000 ug | INHALATION_SOLUTION | Freq: Two times a day (BID) | RESPIRATORY_TRACT | Status: AC
  Administered 2024-12-25 – 2025-01-06 (×22): 15 ug via RESPIRATORY_TRACT
  Filled 2024-12-25 (×24): qty 2

## 2024-12-25 MED ORDER — PHENYLEPHRINE 80 MCG/ML (10ML) SYRINGE FOR IV PUSH (FOR BLOOD PRESSURE SUPPORT): 100.0000 ug | PREFILLED_SYRINGE | Freq: Once | INTRAVENOUS | Status: AC

## 2024-12-25 MED ORDER — PIPERACILLIN-TAZOBACTAM 3.375 G IVPB
3.3750 g | Freq: Three times a day (TID) | INTRAVENOUS | Status: DC
  Administered 2024-12-25 – 2024-12-27 (×7): 3.375 g via INTRAVENOUS
  Filled 2024-12-25 (×7): qty 50

## 2024-12-25 NOTE — Progress Notes (Signed)
 Patient removed from bipap and placed on [place] with 20ppm. Patient tolerating well. RT will continue to monitor.

## 2024-12-25 NOTE — Progress Notes (Signed)
 TCTS Evening Rounds:  Intubated today for increased work of breathing and desaturation. Lactic acid up to 3.9.  She was on Cleviprex  this morning to keep MAP down and then after intubation required increased vasopressor to maintain MAP.  Now on milrinone  0.375, epi 1, NE 0-2.   Filling pressures remain on the low side  LVAD speed 4800, flow 3.2. arterial waveform less pulsatile.  UO ok  Temp 98.6. Vanc and Zosyn . Lactic acid down to 1.7.

## 2024-12-25 NOTE — Progress Notes (Signed)
 "  NAME:  Karen Warner, MRN:  [id number], DOB:  12-08-1950, LOS: 10 ADMISSION DATE:  12/14/2024, CONSULTATION DATE:  1/23 REFERRING MD:  Lucas, CHIEF COMPLAINT:  post-LVAD   History of Present Illness:  [name] is a 74 y/o woman with a history of ICM and HFrEF who presented on 1/14 with chest pain and decompensated heart failure. She underwent left and right heart catheterization this admission showing low CO and low filling pressures.  She was managed with inotropic support. Due to progression of disease and poor tolerance of GDMT, she was evaluated for LVAD.  Intraoperatively her course was uncomplicated other than low flows on LVAD when her BP went up at the end of the case when she was stimulated. She transferred to the ICU on epi 2, NE 1, milrinone  0.336mcg. TEE with normal RV function, evidence of intrapulmonary shunt.  Paralytics not reversed in ICU. Bypass time , crossclamp time 29 min, cell saver 225cc. LVAD speed 4600 RPM. Alarms on LVAD went off during periods of hypertension post-op, controlled by afterload reduction.   Pertinent  Medical History  HFrEF due to ICM DM2 IDA FM CKD3  Significant Hospital Events: Including procedures, antibiotic start and stop dates in addition to other pertinent events   1/14 admission 1/15 L&R heart cath> nonobstructive disease, low filling pressures 1/23 LVAD 1/24 extubated; fluid boluses & milrinone  increased due to frequent LVAD alarms with high PI  Interim History / Subjective:  Tmax 101.3, tachycardic to 130s with fever, but responded to tylenol . Low oxygen saturations- treated with fentanyl  & bipap with improvement. This morning she is agitated on bipap and wants to take it off.  Complaining of back pain. Still frequent LVAD alarms.   Objective    Blood pressure (!) 79/63, pulse (!) 113, temperature 98.6 F (37 C), resp. rate (!) 27, height 5' 5 (1.651 m), weight 83.1 kg, SpO2 94%. PAP: (18-38)/(8-28) 22/15 CVP:  [5  mmHg-22 mmHg] 6 mmHg CO:  [3.4 L/min-5.5 L/min] 5.5 L/min CI:  [1.9 L/min/m2-3 L/min/m2] 3 L/min/m2  Vent Mode: BIPAP;PCV FiO2 (%):  [50 %-60 %] 60 % Set Rate:  [12 bmp-16 bmp] 12 bmp Vt Set:  [450 mL] 450 mL PEEP:  [5 cmH20-8 cmH20] 8 cmH20 Pressure Support:  [8 cmH20-10 cmH20] 8 cmH20 Plateau Pressure:  [22 cmH20] 22 cmH20   Intake/Output Summary (Last 24 hours) at 12/25/2024 9277 Last data filed at 12/25/2024 0600 Gross per 24 hour  Intake 2345.19 ml  Output 1630 ml  Net 715.19 ml   Filed Weights   12/23/24 0634 12/24/24 0500 12/25/24 0500  Weight: 73.8 kg 84.4 kg 83.1 kg    Examination: General: ill appearing woman sitting up in bed in NAD HENT: [place]/AT, eyes anicteric Lungs: breathing comfortably on HFNC, much more comfortably than on bipap. Ongoing tachypnea, faint rhales.  Cardiovascular: mechanical VAD sounds, serosanguinous chest tube output Abdomen: soft, NT Extremities: no cyansosis, +edema Neuro: awake, alert, answering questions  GU: foley with yellow urine  PAPI <1 CVP ~13 CI ~2  I/O +730cc, net +9.2L  Coox 64% BUN 11 Cr 1.15 T bili 1.6 LDH 529 WBC 23.6 H/H 8.5/25.7 Platelets 182 INR 1.5   CXR personally reviewed>  low lung volumes. LVAD, AICD, barostim, chest tubes, ETT, CVC in place.    Resolved problem list   Assessment and Plan   Acute on chronic HFrEF due to ICM; NYHA class IV heart failure s/p LVAD on 1/23 -post-op care per TCTS; pull chest tubes today -  LVAD adjustments per AHF -worry she needs more RV support- will discuss with AHF -con't milrinone  0.375mcg and NE titrating to maintain MAP 70-80, plus cleviprex  for afterload reduction. Very sensitive BP with titration of inotropes. -pain control per protocol- fentanyl , oxy, tramadol. Gabapentin  for pocket pain.  -adding robaxin  -con't iNO -mobility as able; up to chair position this morning  Post-op vent management; PFTs with minimal pulmonary restriction likely from cardiomegaly &  body habitus.    -con't HFNC + iNO; can use BiPAP as needed -pulmonary hygiene -goal PaO2> 90% -brovana  & yupelri   Fevers, leukocytosis- suspect this is inflammatory post-op -will discuss antibiotics with AHF & TCTS, but no obvious source identified  Hyperglycemia; h/o DM. A1c 6.6 -increase glargine to 15 units daily -SSI PRN; keep q4h for now -goal BG 140-180  GERD -con't PPI & pepcid   H/o migraine -family brought in PTA Ubrelvy - con't this  H/o FM -con't PTA Trintellix  -optimize sleep, mobility as able  CKD 3b -strict I/O -renally dose meds, avoid nephrotoxic meds  Daughter updated this morning at bedside.   Labs   CBC: Recent Labs  Lab 12/23/24 1329 12/23/24 1850 12/23/24 1851 12/23/24 2000 12/24/24 0406 12/24/24 0523 12/24/24 1700 12/24/24 1822 12/25/24 0044 12/25/24 0414 12/25/24 0515  WBC 14.7*  --  14.3*  --  17.6*  --  18.5*  --   --  23.6*  --   NEUTROABS  --   --   --   --  13.6*  --   --   --   --  19.9*  --   HGB 8.9*   < > 9.2*  --  9.1*   < > 9.4* 9.2* 7.8* 8.5* 8.8*  HCT 26.0*   < > 27.2*  --  28.3*   < > 28.3* 27.0* 23.0* 25.7* 26.0*  MCV 92.9  --  93.8  --  94.3  --  95.6  --   --  96.3  --   PLT 136*  --  170 182 202  --  198  --   --  182  --    < > = values in this interval not displayed.    Basic Metabolic Panel: Recent Labs  Lab 12/23/24 0223 12/23/24 0601 12/23/24 0754 12/23/24 1115 12/23/24 1118 12/23/24 1851 12/24/24 0406 12/24/24 0523 12/24/24 1700 12/24/24 1822 12/25/24 0044 12/25/24 0414 12/25/24 0515  NA 134* 137   < > 139   < > 138 140   < > 136 137 135 136 138  K 4.4 3.7   < > 4.4   < > 4.9 4.9   < > 4.0 3.9 3.9 4.3 4.0  CL 101 111   < > 100  --  105 108  --  107  --   --  106  --   CO2 22 17*  --   --   --  19* 19*  --  18*  --   --  19*  --   GLUCOSE 182* 183*   < > 168*  --  209* 180*  --  156*  --   --  181*  --   BUN 16 13   < > 14  --  12 12  --  10  --   --  11  --   CREATININE 1.75* 1.36*   < > 1.30*   --  1.53* 1.52*  --  1.30*  --   --  1.15*  --   CALCIUM   9.6 7.5*  --   --   --  8.5* 8.5*  --  8.5*  --   --  8.4*  --   MG 2.4  --   --   --   --  3.3* 2.8*  --  2.5*  --   --  2.4  --   PHOS  --   --   --   --   --   --  2.9  --   --   --   --  2.0*  --    < > = values in this interval not displayed.   GFR: Estimated Creatinine Clearance: 45.7 mL/min (A) (by C-G formula based on SCr of 1.15 mg/dL (H)). Recent Labs  Lab 12/19/24 1831 12/19/24 1938 12/20/24 0428 12/23/24 1851 12/24/24 0406 12/24/24 1700 12/25/24 0044 12/25/24 0414  WBC  --   --    < > 14.3* 17.6* 18.5*  --  23.6*  LATICACIDVEN 0.8 0.9  --   --   --   --  0.9  --    < > = values in this interval not displayed.    Critical care time:     This patient is critically ill with multiple organ system failure which requires frequent high complexity decision making, assessment, support, evaluation, and titration of therapies. This was completed through the application of advanced monitoring technologies and extensive interpretation of multiple databases. During this encounter critical care time was devoted to patient care services described in this note for 45 minutes.   Leita [name], DO 12/25/24 10:45 AM [facility]  For contact information, see Amion. If no response to pager, please call PCCM 2H APP. After hours, 7PM- 7AM, please call on call APP for 2H.       "

## 2024-12-25 NOTE — Progress Notes (Signed)
 Placed pt on BiPAP due to increased WOB and increasing need for O2. Pt tolerating well at this time

## 2024-12-25 NOTE — Progress Notes (Signed)
 "    Advanced Heart Failure Rounding Note  Cardiologist: Dub Huntsman, DO  [facility] Cardiologist: Dr Rolan  Chief Complaint: Post Op HMIII LVAD  Patient Profile   Karen Warner is a 74 y.o. female with history of CAD and ischemic cardiomyopathy, EF <20%, CKD III and COPD admitted w/ a/c CHF, chest/abdominal pain. RHC c/w low output.   Significant events:   1/15: RHC (RA 2, PA 21/10, PCW 3,  PA sat 59%, TD CI 1.49, FICK CI 1.88, PAPi 5.5), started on milrinone            LHC patent stents, nonobstructive CAD   1/19: co-ox persistently in 40s, milrinone  increased 0.375 12/23/24: S/P HMIII Implant.Intra-op speed placed at 4700 due to low filling pressures.  Received 2UPRBC, 3 FFP, cellsaver, 1 albumin , and 4 FFP  12/23/24 POCUS: VAD well positioned. RV looked ok but PAPI low with high CVP 12/24/24: Extubated. Had some low flows. Cleveprex started   Subjective:    Extubated yesterday. Had some low flows. Cleveprex started  Off epi. Now on milrinone  0.375 Co-ox 64%  Respiratory status much worse. Very tachypneic. Unable t tolerate Bipap. Tiring out  I did POCUS echo today. LVAD cannula well positioned, LV septum bulging to R. RV severely depressed. Probable small to moderate effusion. VAD turned to 4800  I did swan numbers personally CVP 12 PA 35/20 (28) Continuous CO/CI  5.9/3.3 PAPi 1.25  LVAD Interrogation HM 3: Speed: 4600 Flow: 2.7 PI: 7.6 Power: 3.0.  Objective:   Weight Range: 83.1 kg Body mass index is 30.49 kg/m.   Vital Signs:   Temp:  [97.9 F (36.6 C)-101.3 F (38.5 C)] 98.8 F (37.1 C) (01/25 1300) Pulse Rate:  [93-139] 127 (01/25 1245) Resp:  [0-47] 25 (01/25 1300) SpO2:  [63 %-100 %] 100 % (01/25 1245) Arterial Line BP: (70-150)/(48-77) 86/69 (01/25 1300) FiO2 (%):  [60 %-100 %] 100 % (01/25 1247) Weight:  [83.1 kg] 83.1 kg (01/25 0500) Last BM Date : 12/19/24  Weight change: Filed Weights   12/23/24 0634 12/24/24 0500 12/25/24 0500  Weight:  73.8 kg 84.4 kg 83.1 kg    Intake/Output:   Intake/Output Summary (Last 24 hours) at 12/25/2024 1345 Last data filed at 12/25/2024 1300 Gross per 24 hour  Intake 1843.42 ml  Output 1340 ml  Net 503.42 ml     Physical Exam   General:  ill-appearing. Tachypneic and tired HEENT: normal  Neck: supple. RIJ swan   Cor: LVAD hum.  Lungs: tachypneic + crackles Abdomen: soft, nontender, non-distended. No hepatosplenomegaly. No bruits or masses. Hypoactive bowel sounds. Driveline site clean. Anchor in place.  Extremities: no cyanosis, clubbing, rash. Warm 1+ edema  Neuro: alert & oriented x 3. No focal deficits. Moves all 4 without problem    Telemetry   Likely sinus 130s Personally reviewed  Labs   CBC Recent Labs    12/24/24 0406 12/24/24 0523 12/24/24 1700 12/24/24 1822 12/25/24 0414 12/25/24 0515  WBC 17.6*  --  18.5*  --  23.6*  --   NEUTROABS 13.6*  --   --   --  19.9*  --   HGB 9.1*   < > 9.4*   < > 8.5* 8.8*  HCT 28.3*   < > 28.3*   < > 25.7* 26.0*  MCV 94.3  --  95.6  --  96.3  --   PLT 202  --  198  --  182  --    < > =  values in this interval not displayed.   Basic Metabolic Panel Recent Labs    98/75/73 0406 12/24/24 0523 12/24/24 1700 12/24/24 1822 12/25/24 0414 12/25/24 0515  NA 140   < > 136   < > 136 138  K 4.9   < > 4.0   < > 4.3 4.0  CL 108  --  107  --  106  --   CO2 19*  --  18*  --  19*  --   GLUCOSE 180*  --  156*  --  181*  --   BUN 12  --  10  --  11  --   CREATININE 1.52*  --  1.30*  --  1.15*  --   CALCIUM  8.5*  --  8.5*  --  8.4*  --   MG 2.8*  --  2.5*  --  2.4  --   PHOS 2.9  --   --   --  2.0*  --    < > = values in this interval not displayed.   Liver Function Tests Recent Labs    12/24/24 0406 12/25/24 0414  AST 117* 123*  ALT 12 11  ALKPHOS 52 61  BILITOT 0.6 1.6*  PROT 5.9* 6.1*  ALBUMIN  3.8 3.9   No results for input(s): LIPASE, AMYLASE in the last 72 hours. Cardiac Enzymes No results for input(s): CKTOTAL,  CKMB, CKMBINDEX, TROPONINI in the last 72 hours.  BNP: BNP (last 3 results) Recent Labs    09/01/24 1457  BNP 169.4*    ProBNP (last 3 results) Recent Labs    12/13/24 1529 12/14/24 0826 12/24/24 0406  PROBNP 1,342.0* 1,539.0* 3,345.0*     D-Dimer Recent Labs    12/23/24 2000  DDIMER 0.98*   Hemoglobin A1C No results for input(s): HGBA1C in the last 72 hours. Fasting Lipid Panel No results for input(s): CHOL, HDL, LDLCALC, TRIG, CHOLHDL, LDLDIRECT in the last 72 hours. Medications:   Scheduled Medications:  acetaminophen   1,000 mg Oral Q6H   Or   acetaminophen  (TYLENOL ) oral liquid 160 mg/5 mL  1,000 mg Per Tube Q6H   arformoterol   15 mcg Nebulization BID   bisacodyl   10 mg Oral Daily   Or   bisacodyl   10 mg Rectal Daily   Chlorhexidine  Gluconate Cloth  6 each Topical Daily   docusate sodium   200 mg Oral Daily   gabapentin   200 mg Oral QHS   insulin  aspart  0-24 Units Subcutaneous Q4H   insulin  glargine  15 Units Subcutaneous Daily   lidocaine   1 patch Transdermal Q24H   methocarbamol   500 mg Oral Q8H   metoCLOPramide  (REGLAN ) injection  10 mg Intravenous Q6H   pantoprazole   40 mg Oral Daily   polyethylene glycol  17 g Per Tube Daily   revefenacin   175 mcg Nebulization Daily   senna  1 tablet Per Tube BID   sodium chloride  flush  3 mL Intravenous Q12H   valACYclovir   500 mg Oral Daily   Vitamin D  (Ergocalciferol )  50,000 Units Oral Q Sun   vortioxetine  HBr  5 mg Oral QHS    Infusions:  albumin  human Stopped (12/25/24 1258)   clevidipine  Stopped (12/25/24 1155)   dexmedetomidine  (PRECEDEX ) IV infusion 0.7 mcg/kg/hr (12/25/24 1300)   fentaNYL  infusion INTRAVENOUS 100 mcg/hr (12/25/24 1300)   milrinone  0.375 mcg/kg/min (12/25/24 1300)   norepinephrine  (LEVOPHED ) Adult infusion Stopped (12/25/24 1300)   sodium PHOSPHATE  IVPB (in mmol) 43 mL/hr at 12/25/24 1300  PRN Medications: albumin  human, dextrose , fentaNYL , fentaNYL   (SUBLIMAZE ) injection, HYDROmorphone , ondansetron  (ZOFRAN ) IV, mouth rinse, sodium chloride  flush, Ubrogepant    HeartMate 3 VAD Equipment Check Pump Speed (RPM): 4600 RPM HeartMate 3 VAD Equipment Check Pump Speed (RPM): 4600 RPM Pump Flow (LPM): 2.9 Power (Watts): 3 Watts Pulsatility Index: 6.1 Fixed Speed Limit: 4600 rpm Low Speed Limit: 4300 rpm Alarms: No alarms Auscultated: Normal expected humming Power Module Self-Test (Daily): Done System Controller Self-Test: Passed Patient Battery Source: [facility] / Wall unit Emergency Equipment at Bedside: Yes   Assessment/Plan   1. 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  - 12/23/24 POCUS: VAD well positioned. RV looked ok but PAPI low with high CVP - Currently on milrinone  0.375 off epi on cleveprex at 10 for afterload reduction - CO-OX 64% - Respiratory status much worse. Very tachypneic. Unable to tolerate Bipap. Tiring out - POCUS echo today. LVAD cannula well positioned, LV septum bulging to R. RV severely depressed. Probable small to moderate effusion. VAD turned to 4800 - Patient re-intubated today by Dr. Gretta (Dr. Lucas also at bedside) - Continue milrinone  and iNO. Stop cleveprex for now. May need RV support - Diurese with lasix  gtt  2.  Acute on chronic systolic CHF: Ischemic cardiomyopathy.  [employer] CRT-D device  - She is end-stage NYHA IV. MRB approved LVAD for Destination Therapy.  - s/p HM-3 VAD - management as above  3. Acute hypoxic respiratory status - reintubated today - diurese - broad spectrum abx  4. CAD: PCI to mid/distal LAD in 2020 and proximal LAD in 2021.   Cath this admit showed stable coronary anatomy. Suspect CP related to HF as above - Off ranolazine , imdur , plavix  - Restart Crestor  + Zetia  in a few days.   5. COPD: History of smoking, quit 2021.    6. CKD 3: Follows with nephology at [facility].  - Scr stable at 1.15  today  7. Obesity: Has been on semaglutide.Now on hold.   8. PAD: Moderately decreased ABI on right in 7/25. Medical mgmt per Dr. Serene. Again ABI on 1/19, the R had moderate disease, and mild on the L. No claudication or other PAD symptoms.     CRITICAL CARE Performed by: Toribio Fuel   Total critical care time: 49 minutes  Critical care time was exclusive of separately billable procedures and treating other patients.  Critical care was necessary to treat or prevent imminent or life-threatening deterioration.  Critical care was time spent personally by me on the following activities: development of treatment plan with patient and/or surrogate as well as nursing, discussions with consultants, evaluation of patient's response to treatment, examination of patient, obtaining history from patient or surrogate, ordering and performing treatments and interventions, ordering and review of laboratory studies, ordering and review of radiographic studies, pulse oximetry and re-evaluation of patient's condition.   Length of Stay: 10  Toribio Fuel, MD  12/25/2024, 1:45 PM  Advanced Heart Failure Team Pager [phone] (M-F; 7a - 5p)   Please visit [email or web address]: For overnight coverage please call cardiology fellow first. If fellow not available call Shock/ECMO MD on call.  For ECMO / Mechanical Support (Impella, IABP, LVAD) issues call Shock / ECMO MD on call.     "

## 2024-12-25 NOTE — Progress Notes (Signed)
 HeartMate 3 Rounding Note  Subjective:    Some low flow alarms overnight and started on Cleviprex  to keep MAP around 65 to minimize low flow.  She was also desaturating and started on Bipap with improvement.  Having a fair amount of pain and received fentanyl  multiple times overnight.  Milrinone  increased to 0.375 and epi turned off.  CI 3.0, Co-ox 63.9 this am. PA 22/15 with CVP 6-7 this am. Very pulsatile arterial tracing.   LVAD INTERROGATION:  HeartMate IIl LVAD:  Flow 2.7 liters/min, speed 4600, power 3, PI 7.2.    Objective:    Vital Signs:   Temp:  [97.9 F (36.6 C)-101.3 F (38.5 C)] 98.6 F (37 C) (01/25 0715) Pulse Rate:  [87-250] 113 (01/25 0715) Resp:  [0-42] 27 (01/25 0715) SpO2:  [62 %-100 %] 94 % (01/25 0715) Arterial Line BP: (77-150)/(51-77) 83/51 (01/25 0715) FiO2 (%):  [50 %-60 %] 60 % (01/25 0114) Weight:  [83.1 kg] 83.1 kg (01/25 0500) Last BM Date : 12/19/24 Mean arterial Pressure 68  Intake/Output:   Intake/Output Summary (Last 24 hours) at 12/25/2024 0802 Last data filed at 12/25/2024 0600 Gross per 24 hour  Intake 2167.88 ml  Output 1500 ml  Net 667.88 ml     Physical Exam: General:  uncomfortable on bipap. Wants it off. Cor: Distant heart sounds with LVAD hum present. Lungs: few crackles on right Abdomen: soft, nontender, nondistended. Hypoactive bowel sounds. Extremities: moderate edema Neuro: alert & orientedx3, cranial nerves grossly intact. moves all 4 extremities w/o difficulty. Affect pleasant  Telemetry: Internally paced 120's, sensing [facility] and pacing ventricle  Labs: Basic Metabolic Panel: Recent Labs  Lab 12/23/24 0223 12/23/24 0601 12/23/24 0754 12/23/24 1115 12/23/24 1118 12/23/24 1851 12/24/24 0406 12/24/24 0523 12/24/24 1700 12/24/24 1822 12/25/24 0044 12/25/24 0414 12/25/24 0515  NA 134* 137   < > 139   < > 138 140   < > 136 137 135 136 138  K 4.4 3.7   < > 4.4   < > 4.9 4.9   < > 4.0 3.9 3.9 4.3 4.0  CL  101 111   < > 100  --  105 108  --  107  --   --  106  --   CO2 22 17*  --   --   --  19* 19*  --  18*  --   --  19*  --   GLUCOSE 182* 183*   < > 168*  --  209* 180*  --  156*  --   --  181*  --   BUN 16 13   < > 14  --  12 12  --  10  --   --  11  --   CREATININE 1.75* 1.36*   < > 1.30*  --  1.53* 1.52*  --  1.30*  --   --  1.15*  --   CALCIUM  9.6 7.5*  --   --   --  8.5* 8.5*  --  8.5*  --   --  8.4*  --   MG 2.4  --   --   --   --  3.3* 2.8*  --  2.5*  --   --  2.4  --   PHOS  --   --   --   --   --   --  2.9  --   --   --   --  2.0*  --    < > =  values in this interval not displayed.    Liver Function Tests: Recent Labs  Lab 12/19/24 1832 12/23/24 0223 12/24/24 0406 12/25/24 0414  AST 18 19 117* 123*  ALT 8 8 12 11   ALKPHOS 54 62 52 61  BILITOT 0.3 0.3 0.6 1.6*  PROT 6.2* 7.3 5.9* 6.1*  ALBUMIN  3.3* 4.0 3.8 3.9   No results for input(s): LIPASE, AMYLASE in the last 168 hours. No results for input(s): AMMONIA in the last 168 hours.  CBC: Recent Labs  Lab 12/23/24 1329 12/23/24 1850 12/23/24 1851 12/23/24 2000 12/24/24 0406 12/24/24 0523 12/24/24 1700 12/24/24 1822 12/25/24 0044 12/25/24 0414 12/25/24 0515  WBC 14.7*  --  14.3*  --  17.6*  --  18.5*  --   --  23.6*  --   NEUTROABS  --   --   --   --  13.6*  --   --   --   --  19.9*  --   HGB 8.9*   < > 9.2*  --  9.1*   < > 9.4* 9.2* 7.8* 8.5* 8.8*  HCT 26.0*   < > 27.2*  --  28.3*   < > 28.3* 27.0* 23.0* 25.7* 26.0*  MCV 92.9  --  93.8  --  94.3  --  95.6  --   --  96.3  --   PLT 136*  --  170 182 202  --  198  --   --  182  --    < > = values in this interval not displayed.    INR: Recent Labs  Lab 12/22/24 1837 12/23/24 1329 12/23/24 2000 12/24/24 0406 12/25/24 0414  INR 1.0 1.3* 1.3* 1.3* 1.5*    Other results: EKG:   Imaging: DG Chest Port 1 View Result Date: 12/25/2024 EXAM: 1 VIEW(S) XRAY OF THE CHEST 12/25/2024 01:21:34 AM COMPARISON: 12/24/2024 CLINICAL HISTORY: Acute hypoxic  respiratory failure. ICD10: 8228802 Acute hypoxic respiratory failure (HCC). FINDINGS: LINES, TUBES AND DEVICES: Endotracheal tube and enteric tube removed. Right IJ Swan-Ganz catheter in place with tip overlying the expected region of the main pulmonary artery. Right thoracostomy tubes in place. LUNGS AND PLEURA: No focal pulmonary opacity. No pleural effusion. No pneumothorax. HEART AND MEDIASTINUM: Stable cardiomegaly. LVAD in place. Dual chamber AICD in place. Neurostimulator device overlies the right chest wall with its single lead at the right neck base. BONES AND SOFT TISSUES: Median sternotomy noted. No acute osseous abnormality. IMPRESSION: 1. No acute cardiopulmonary abnormality. 2. Interval removal of the endotracheal and enteric tubes. 3. Right IJ Swan-Ganz catheter tip overlies the expected region of the main pulmonary artery bifurcation 4. Dual right thoracostomy tubes in place. No pneumothorax. 5. LVAD, dual-chamber AICD, and right chest wall neurostimulator device in place. 6. Stable cardiomegaly Electronically signed by: Dorethia Molt MD 12/25/2024 01:28 AM EST RP Workstation: [id number]   DG Chest Port 1 View Result Date: 12/24/2024 CLINICAL DATA:  Left ventricular assist device present. EXAM: PORTABLE CHEST 1 VIEW COMPARISON:  Radiograph yesterday FINDINGS: Stable positioning of endotracheal tube, enteric tube, left-sided pacemaker and left ventricular assist device. Bilateral chest tubes and probable mediastinal drain in place. No demonstrated pneumothorax. Overall low lung volumes. Stable cardiomegaly allowing for lower lung volumes. Central vascular congestion. Limited assessment for pleural effusion. IMPRESSION: 1. Stable support apparatus. No pneumothorax. 2. Low lung volumes with central vascular congestion. Electronically Signed   By: Andrea Gasman M.D.   On: 12/24/2024 12:42   DG Chest Port 1 View Result Date:  12/23/2024 CLINICAL DATA:  Left ventricular assist device EXAM:  PORTABLE CHEST 1 VIEW COMPARISON:  December 14, 2024. FINDINGS: There is been interval placement of left ventricular assist device. Left-sided defibrillator is again noted. Endotracheal and nasogastric tubes are in good position. Probable left basilar atelectasis and possible small pleural effusion may be present. Left-sided chest tube is noted. No pneumothorax. IMPRESSION: Interval placement of left ventricular assist device. Probable left basilar atelectasis and possible small pleural effusion. Electronically Signed   By: Lynwood Landy Raddle M.D.   On: 12/23/2024 14:15     Medications:     Scheduled Medications:  acetaminophen   1,000 mg Oral Q6H   Or   acetaminophen  (TYLENOL ) oral liquid 160 mg/5 mL  1,000 mg Per Tube Q6H   arformoterol   15 mcg Nebulization BID   bisacodyl   10 mg Oral Daily   Or   bisacodyl   10 mg Rectal Daily   Chlorhexidine  Gluconate Cloth  6 each Topical Daily   docusate sodium   200 mg Oral Daily   gabapentin   200 mg Oral QHS   insulin  aspart  0-24 Units Subcutaneous Q4H   insulin  glargine  15 Units Subcutaneous Daily   metoCLOPramide  (REGLAN ) injection  10 mg Intravenous Q6H   pantoprazole   40 mg Oral Daily   polyethylene glycol  17 g Per Tube Daily   revefenacin   175 mcg Nebulization Daily   senna  1 tablet Per Tube BID   sodium chloride  flush  3 mL Intravenous Q12H   valACYclovir   500 mg Oral Daily   Vitamin D  (Ergocalciferol )  50,000 Units Oral Q Sun   vortioxetine  HBr  5 mg Oral QHS    Infusions:  clevidipine  12 mg/hr (12/25/24 0709)   milrinone  0.375 mcg/kg/min (12/25/24 0600)    PRN Medications: dextrose , fentaNYL , fentaNYL  (SUBLIMAZE ) injection, HYDROmorphone , ondansetron  (ZOFRAN ) IV, mouth rinse, sodium chloride  flush, Ubrogepant    Assessment:   POD 2 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 Normal RV. Preop LVEF 20-25%. She has CRT-D  device and Barostim.   CAD with stable coronary anatomy per recent cath.    COPD with previous smoking until 2021.   Stage 3 CKD with creat around 1.5-1.6 preop.   Preop RUQ and epigastric abdominal pain with no evidence of acute cholecystitis on US  . Seen by GI. Felt to possibly be related to low CO.   Obesity.   Plan/Discussion:    She remains particularly sensitive to increased MAP with low flow alarms but tolerates it well since her RV is normal and LV seems to maintain good pulsatile flow. This is going to take continued tight management of MAP and gradual increase in speed.  Wt is 20 lbs over preop and will need some gentle diuresis.   Chest tube output low. Will remove both pleural tubes and anterior mediastinal tube today. Keep pocket drain in.   DC temp pacing wires.  Plan to start Coumadin  tonight. 2 mg.  Temp to 101.3 last night, 99.3 this am. Leukocytosis to 23.6. It is likely inflammatory this early postop but will discuss empiric antibiotics with DB. Prophylactic antibiotics are completed.     I reviewed the LVAD parameters from today, and compared the results to the patient's prior recorded data.  No programming changes were made.  The LVAD is functioning within specified parameters.  Length of Stay: [address]  [name] 12/25/2024, 8:02 AM

## 2024-12-25 NOTE — Procedures (Signed)
 Intubation Procedure Note  Karen Warner  [id number]  15-Dec-1950  Date:12/25/24  Time:12:32 PM   Provider Performing:Karen Warner Karen Warner    Procedure: Intubation (31500)  Indication(s) Respiratory Failure  Consent Risks of the procedure as well as the alternatives and risks of each were explained to the patient and/or caregiver.  Consent for the procedure was obtained and is signed in the bedside chart   Anesthesia Versed , Fentanyl , and Rocuronium    Time Out Verified patient identification, verified procedure, site/side was marked, verified correct patient position, special equipment/implants available, medications/allergies/relevant history reviewed, required imaging and test results available.   Sterile Technique Usual hand hygeine, masks, and gloves were used   Procedure Description Patient positioned in bed supine.  Sedation given as noted above.  Patient was intubated with endotracheal tube using Glidescope.  View was Grade 1 full glottis .  Number of attempts was 1.  Colorimetric CO2 detector was consistent with tracheal placement.   Complications/Tolerance None; patient tolerated the procedure well. Chest X-ray is ordered to verify placement.   EBL 0   Specimen(s) None  Karen Karen Gaskins, DO 12/25/24 12:32 PM [facility]

## 2024-12-25 NOTE — Progress Notes (Signed)
 Drive Line:  Existing VAD dressing removed and site care performed using sterile technique. Drive line exit site cleaned with Chlora prep applicators x 2, allowed to dry, and gauze dressing with Silverlon patch applied. Exit site healing and unincorporated, the velour is fully implanted at site. No suture visible at the drive line site. Moderate amount of sanguinous drainage on previous dressing. No redness, tenderness, foul odor, or rash noted. Drive line anchor reapplied. Continue daily dressing changes by VAD coordinator or nurse champion only. Next dressing change due 12/25/24.   Lauraine Ahle RN

## 2024-12-25 NOTE — Progress Notes (Signed)
 Drive Line:  Existing VAD dressing removed and site care performed using sterile technique. Drive line exit site cleaned with Chlora prep applicators x 2, allowed to dry, and gauze dressing with Silverlon patch applied. Exit site healing and unincorporated, the velour is fully implanted at site. No suture visible at the drive line exit site. Small amount of sanguinous drainage on previous dressing. No redness, tenderness, foul odor, or rash noted. Drive line anchor intact. Continue daily dressing changes by VAD coordinator or nurse champion only. Next dressing change due 12/26/24.   Lauraine Ahle RN

## 2024-12-25 NOTE — Progress Notes (Signed)
 Night Critical Care and Cross coverage    Interval/subjective  Events of day reviewed Now intubated and on antibiotics.   Sedated on fent at 100mcg/hr and dex at 0.07 Requiring NE at times Epi at 1mcg/min CI improved from 1.8 to 2.4 w/ this change  Milrinone  at 0.37  Type of VAD VAD Type: HeartMate 3 Pump Speed (RPM): 4600 RPM Pump Speed (RPM): 4800 RPM Pump Flow (LPM): 2.8 Power (Watts): 3 Watts Pulsatility Index: 7.4->5.5   Current Co-ox 64%  Did just have run of VT.   Labs Gluc 163->185 Lactic acid 3.9 (prior to intubation)->1.7 LDH 464->529 PO4 2.0 (received supplementation)  Mg 2.4 Wbc 18.5->23.6 tmax now 99.7  K 4.3 Cr 1.3->1.15 Bicarb 18->19 hgb 9.2->8.5 Ast 117->123 Alt 12->11  Appears comfortable on vent   Objective   BP (!) 79/63   Pulse 96   Temp 98.1 F (36.7 C)   Resp (!) 25   Ht 5' 5 (1.651 m)   Wt 83.1 kg   SpO2 100%   BMI 30.49 kg/m    PAP: (15-38)/(8-30) 20/13 CVP:  [1 mmHg-21 mmHg] 6 mmHg CO:  [3.9 L/min-5.9 L/min] 4.3 L/min CI:  [2.2 L/min/m2-3.3 L/min/m2] 2.4 L/min/m2  I/O last 3 completed shifts: In: 3513.3 [I.V.:1178; NG/GT:259; IV Piggyback:2076.3] Out: 2585 [Urine:2085; Chest Tube:500] Total I/O In: 74.1 [I.V.:41.6; NG/GT:20; IV Piggyback:12.5] Out: 100 [Urine:100]  Type of VAD VAD Type: HeartMate 3 Pump Speed (RPM): 4600 RPM Pump Speed (RPM): 4800 RPM Pump Flow (LPM): 2.8 Power (Watts): 3 Watts Pulsatility Index: 7.4   Exam General sedated on fent and dex appears comfortable  HENT orally intubated  Pulm dec bases. Currently on full vent support. Post intubation ABG 7.34 pco2 33 po2 281 bicarb 18 sats 100%  Pcxr ett good position better aerated  Card currently SR LVAD hum. Settings as above. Dressing CD&I.  CTs 130 ml over last shift Ext warm  Neuro will open eyes and follow commands Gu cl yellow   Impression and plan    Post operative hypoxic respiratory failure 2/2 atelectasis  -no sig edema on imaging.  Improved post-intubation aeration on cxr  Plan Cont full vent support PAD protocol RASS -1 VAP bundle  Cont fixed dose iNO Am CXR  Acute on chronic HF 2/2 ICM. (End stage) now s/p LVAD -still on lower speeds of 4600. Having trouble with low flow alarms. Seems volume responsive but also intolerant of MAP > 80  Plan Cont LVAD per HF and cardiac surg Milrinone  at 0.375 Hold epi at 1 mcg/min given improved CI w/ this change  Tight BP control requiring tight rope of Clevidipine  PRN NE for MAP >65 but <80  Cont tele   Fever and tachycardia w/ on-going leukocytosis  Worried now about possible infection. Started on broad spec abx today Plan Apap PRN for fever  Day 1 vanc and zosyn   VT. Had prolonged run of VT.  Did get lasix  today  Plan  Check chem and Mg   I personally  spent 32 minutes  on this patient which included: review of medical records, nursing notes, progress notes, evaluation, interpretation of lab data and diagnostic studies, taking independent history, performing exam, documenting plan, ordering diagnostics and interventions for the following critical care issues: Circulatory shock, Acute respiratory failure with the following interventions which included: evaluation of hemodynamics and administration of IV fluid resuscitation , evaluation of complex metabolic derangements and ordering appropriate interventions and or consultations

## 2024-12-25 NOTE — Progress Notes (Signed)
 PT Cancellation Note  Patient Details Name: Karen Warner MRN: [id number] DOB: 10/06/1951   Cancelled Treatment:    Reason Eval/Treat Not Completed: (P) Patient not medically ready. RN requesting PT hold off today as pt is about to be re-intubated. Will plan to follow-up another day as able.    [name], PT, DPT Acute Rehabilitation Services  Office: [phone]    Theo CHRISTELLA Ferretti 12/25/2024, 12:16 PM

## 2024-12-25 NOTE — Progress Notes (Signed)
 Abg reviewed  She is still fairly hypoxic and has metabolic acidosis (noted earlier as well)  Lactate nml Plan 1 amp bicarb Add NIPPV F/u CXR  Trend svo2 Also added scheduled BDs

## 2024-12-25 NOTE — Progress Notes (Signed)
 Pharmacy Antibiotic Note  Karen Warner is a 74 y.o. female admitted on 12/14/2024 with sepsis.  Pharmacy has been consulted for vancomycin /Zosyn  dosing.  Patient is POD2 after LVAD implantation. WBC up to 23.6, LA 3.9, Tmax 101.3. Patient in respiratory distress and requiring re-intubation today.  Plan: Vancomycin  1750 mg IV x1 Vancomycin  1000 mg IV q24h (Vd 0.72, Scr 1.14, eAUC 458) Zosyn  3.375 mg IV q8h Monitor culture data, clinical status, and levels as indicated  Height: 5' 5 (165.1 cm) Weight: 83.1 kg (183 lb 3.2 oz) IBW/kg (Calculated) : 57  Temp (24hrs), Avg:99.5 F (37.5 C), Min:97.9 F (36.6 C), Max:101.3 F (38.5 C)  Recent Labs  Lab 12/19/24 1831 12/19/24 1832 12/19/24 1938 12/20/24 0428 12/23/24 1115 12/23/24 1329 12/23/24 1851 12/24/24 0406 12/24/24 1700 12/25/24 0044 12/25/24 0414 12/25/24 1051  WBC  --   --   --    < >  --  14.7* 14.3* 17.6* 18.5*  --  23.6*  --   CREATININE  --    < >  --    < > 1.30*  --  1.53* 1.52* 1.30*  --  1.15*  --   LATICACIDVEN 0.8  --  0.9  --   --   --   --   --   --  0.9  --  3.9*   < > = values in this interval not displayed.    Estimated Creatinine Clearance: 45.7 mL/min (A) (by C-G formula based on SCr of 1.15 mg/dL (H)).    Allergies[1]  Antimicrobials this admission: Perioperative abx ppx (vanc, rifampin , fluconazole , cefazolin ) Zosyn  1/25 >>  Vancomycin  1/25 >>   Microbiology results: 1/22 MRSA PCR: negative  Thank you for allowing pharmacy to be a part of this patients care.  Nidia KATHEE Schaffer 12/25/2024 2:28 PM     [1]  Allergies Allergen Reactions   Bee Venom Anaphylaxis   Sumatriptan Shortness Of Breath    Migraine worsened   Amoxicillin-Pot Clavulanate Diarrhea and Nausea And Vomiting    GI Intolerance   Oxycodone  Itching and Hives    Other reaction(s): Other (see comments) Funny feeling in head  off the edge    Buprenorphine Hcl     Other reaction(s): Itching    Clarithromycin Diarrhea    Abdominal pain   Duloxetine  Hcl Other (See Comments)    drowsiness   Hydrocodone Itching   Lactose Intolerance (Gi) Other (See Comments)    Upset stomach, gas/bloating   Liraglutide Other (See Comments)    Abdominal discomfort   Tramadol Hcl Itching

## 2024-12-25 NOTE — Progress Notes (Addendum)
 Post intubation was off all vasopressors until she needed a sedation bolus for OGT placement. Had hypotension requiring NE 5, Neo push. Was weaned back off Norepi within a few hours. CI 1.8> start epi 1mcg, con't milrinone  0.367mcg. Giving 1 bottle of albumin . Now CI increased to 2.6. D/w AHF.  Karen [name], DO 12/25/24 5:36 PM [facility]  For contact information, see Amion. If no response to pager, please call PCCM 2H APP. After hours, 7PM- 7AM, please call on call APP for 2H.

## 2024-12-26 ENCOUNTER — Encounter (HOSPITAL_COMMUNITY)

## 2024-12-26 ENCOUNTER — Ambulatory Visit

## 2024-12-26 ENCOUNTER — Encounter (HOSPITAL_COMMUNITY): Payer: Self-pay | Admitting: Surgery

## 2024-12-26 ENCOUNTER — Inpatient Hospital Stay (HOSPITAL_COMMUNITY)

## 2024-12-26 DIAGNOSIS — Z95811 Presence of heart assist device: Secondary | ICD-10-CM

## 2024-12-26 DIAGNOSIS — N1832 Chronic kidney disease, stage 3b: Secondary | ICD-10-CM | POA: Diagnosis not present

## 2024-12-26 DIAGNOSIS — D72829 Elevated white blood cell count, unspecified: Secondary | ICD-10-CM | POA: Diagnosis not present

## 2024-12-26 DIAGNOSIS — I517 Cardiomegaly: Secondary | ICD-10-CM

## 2024-12-26 DIAGNOSIS — J9601 Acute respiratory failure with hypoxia: Secondary | ICD-10-CM | POA: Diagnosis not present

## 2024-12-26 DIAGNOSIS — I255 Ischemic cardiomyopathy: Secondary | ICD-10-CM | POA: Diagnosis not present

## 2024-12-26 DIAGNOSIS — I5023 Acute on chronic systolic (congestive) heart failure: Secondary | ICD-10-CM | POA: Diagnosis not present

## 2024-12-26 DIAGNOSIS — I3139 Other pericardial effusion (noninflammatory): Secondary | ICD-10-CM | POA: Diagnosis not present

## 2024-12-26 DIAGNOSIS — K219 Gastro-esophageal reflux disease without esophagitis: Secondary | ICD-10-CM | POA: Diagnosis not present

## 2024-12-26 DIAGNOSIS — R739 Hyperglycemia, unspecified: Secondary | ICD-10-CM | POA: Diagnosis not present

## 2024-12-26 LAB — COMPREHENSIVE METABOLIC PANEL WITH GFR
ALT: 8 U/L (ref 0–44)
AST: 77 U/L — ABNORMAL HIGH (ref 15–41)
Albumin: 3.4 g/dL — ABNORMAL LOW (ref 3.5–5.0)
Alkaline Phosphatase: 68 U/L (ref 38–126)
Anion gap: 11 (ref 5–15)
BUN: 23 mg/dL (ref 8–23)
CO2: 19 mmol/L — ABNORMAL LOW (ref 22–32)
Calcium: 8.2 mg/dL — ABNORMAL LOW (ref 8.9–10.3)
Chloride: 108 mmol/L (ref 98–111)
Creatinine, Ser: 1.34 mg/dL — ABNORMAL HIGH (ref 0.44–1.00)
GFR, Estimated: 41 mL/min — ABNORMAL LOW
Glucose, Bld: 231 mg/dL — ABNORMAL HIGH (ref 70–99)
Potassium: 3.6 mmol/L (ref 3.5–5.1)
Sodium: 138 mmol/L (ref 135–145)
Total Bilirubin: 0.6 mg/dL (ref 0.0–1.2)
Total Protein: 5.6 g/dL — ABNORMAL LOW (ref 6.5–8.1)

## 2024-12-26 LAB — CBC WITH DIFFERENTIAL/PLATELET
Abs Immature Granulocytes: 0.23 10*3/uL — ABNORMAL HIGH (ref 0.00–0.07)
Basophils Absolute: 0 10*3/uL (ref 0.0–0.1)
Basophils Relative: 0 %
Eosinophils Absolute: 0.1 10*3/uL (ref 0.0–0.5)
Eosinophils Relative: 0 %
HCT: 22.7 % — ABNORMAL LOW (ref 36.0–46.0)
Hemoglobin: 7.6 g/dL — ABNORMAL LOW (ref 12.0–15.0)
Immature Granulocytes: 1 %
Lymphocytes Relative: 9 %
Lymphs Abs: 1.9 10*3/uL (ref 0.7–4.0)
MCH: 31.5 pg (ref 26.0–34.0)
MCHC: 33.5 g/dL (ref 30.0–36.0)
MCV: 94.2 fL (ref 80.0–100.0)
Monocytes Absolute: 1.7 10*3/uL — ABNORMAL HIGH (ref 0.1–1.0)
Monocytes Relative: 8 %
Neutro Abs: 17.1 10*3/uL — ABNORMAL HIGH (ref 1.7–7.7)
Neutrophils Relative %: 82 %
Platelets: 184 10*3/uL (ref 150–400)
RBC: 2.41 MIL/uL — ABNORMAL LOW (ref 3.87–5.11)
RDW: 16.6 % — ABNORMAL HIGH (ref 11.5–15.5)
WBC: 21 10*3/uL — ABNORMAL HIGH (ref 4.0–10.5)
nRBC: 0.6 % — ABNORMAL HIGH (ref 0.0–0.2)

## 2024-12-26 LAB — TYPE AND SCREEN
ABO/RH(D): A POS
Antibody Screen: NEGATIVE
Unit division: 0
Unit division: 0
Unit division: 0
Unit division: 0

## 2024-12-26 LAB — BPAM RBC
Blood Product Expiration Date: 202602082359
Blood Product Unit Number: 202602082359
Blood Product Unit Number: 202602092359
ISSUE DATE / TIME: 202601230932
ISSUE DATE / TIME: 202601230932
ISSUING PHYSICIAN: 202601230932
PRODUCT CODE: 202602092359
PRODUCT CODE: 202602092359
Unit Type and Rh: 202602082359
Unit Type and Rh: 6200
Unit Type and Rh: 6200
Unit Type and Rh: 6200
Unit Type and Rh: 6200
Unit Type and Rh: 6200

## 2024-12-26 LAB — ECHOCARDIOGRAM LIMITED
Est EF: <20
Height: 65 in
Weight: 2938.29 [oz_av]

## 2024-12-26 LAB — HEPARIN LEVEL (UNFRACTIONATED): Heparin Unfractionated: <0.1 [IU]/mL — ABNORMAL LOW (ref 0.30–0.70)

## 2024-12-26 LAB — PROTIME-INR
INR: 1.6 — ABNORMAL HIGH (ref 0.8–1.2)
Prothrombin Time: 20.2 s — ABNORMAL HIGH (ref 11.4–15.2)

## 2024-12-26 LAB — PREPARE RBC (CROSSMATCH)

## 2024-12-26 LAB — COOXEMETRY PANEL
Carboxyhemoglobin: 0.9 % (ref 0.5–1.5)
Methemoglobin: <0.7 % (ref 0.0–1.5)
O2 Saturation: 61.3 %
Total hemoglobin: 6.9 g/dL — CL (ref 12.0–16.0)

## 2024-12-26 LAB — POCT I-STAT 7, (LYTES, BLD GAS, ICA,H+H)
Acid-base deficit: 5 mmol/L — ABNORMAL HIGH (ref 0.0–2.0)
Bicarbonate: 18.8 mmol/L — ABNORMAL LOW (ref 20.0–28.0)
Calcium, Ion: 1.13 mmol/L — ABNORMAL LOW (ref 1.15–1.40)
HCT: 23 % — ABNORMAL LOW (ref 36.0–46.0)
Hemoglobin: 7.8 g/dL — ABNORMAL LOW (ref 12.0–15.0)
O2 Saturation: 99 %
Patient temperature: 37.6
Potassium: 3.5 mmol/L (ref 3.5–5.1)
Sodium: 139 mmol/L (ref 135–145)
TCO2: 20 mmol/L — ABNORMAL LOW (ref 22–32)
pCO2 arterial: 29 mmHg — ABNORMAL LOW (ref 32–48)
pH, Arterial: 7.422 (ref 7.35–7.45)
pO2, Arterial: 147 mmHg — ABNORMAL HIGH (ref 83–108)

## 2024-12-26 LAB — PHOSPHORUS: Phosphorus: 2.1 mg/dL — ABNORMAL LOW (ref 2.5–4.6)

## 2024-12-26 LAB — GLUCOSE, CAPILLARY
Glucose-Capillary: 147 mg/dL — ABNORMAL HIGH (ref 70–99)
Glucose-Capillary: 222 mg/dL — ABNORMAL HIGH (ref 70–99)
Glucose-Capillary: 206 mg/dL — ABNORMAL HIGH (ref 70–99)
Glucose-Capillary: 264 mg/dL — ABNORMAL HIGH (ref 70–99)
Glucose-Capillary: 220 mg/dL — ABNORMAL HIGH (ref 70–99)
Glucose-Capillary: 188 mg/dL — ABNORMAL HIGH (ref 70–99)
Glucose-Capillary: 168 mg/dL — ABNORMAL HIGH (ref 70–99)

## 2024-12-26 LAB — LACTATE DEHYDROGENASE: LDH: 535 U/L — ABNORMAL HIGH (ref 105–235)

## 2024-12-26 LAB — MAGNESIUM: Magnesium: 2.7 mg/dL — ABNORMAL HIGH (ref 1.7–2.4)

## 2024-12-26 MED ORDER — METHYLNALTREXONE BROMIDE 12 MG/0.6ML ~~LOC~~ SOLN
12.0000 mg | Freq: Once | SUBCUTANEOUS | Status: AC
  Administered 2024-12-26: 12 mg via SUBCUTANEOUS
  Filled 2024-12-26: qty 0.6

## 2024-12-26 MED ORDER — VALACYCLOVIR HCL 500 MG PO TABS
500.0000 mg | ORAL_TABLET | Freq: Every day | ORAL | Status: DC
  Administered 2024-12-26 – 2024-12-27 (×2): 500 mg
  Filled 2024-12-26 (×2): qty 1

## 2024-12-26 MED ORDER — VORTIOXETINE HBR 5 MG PO TABS
5.0000 mg | ORAL_TABLET | Freq: Every day | ORAL | Status: DC
  Administered 2024-12-26: 5 mg
  Filled 2024-12-26 (×2): qty 1

## 2024-12-26 MED ORDER — ORAL CARE MOUTH RINSE: 15.0000 mL | OROMUCOSAL | Status: DC | PRN

## 2024-12-26 MED ORDER — CALCIUM GLUCONATE-NACL 1-0.675 GM/50ML-% IV SOLN
1.0000 g | Freq: Once | INTRAVENOUS | Status: AC
  Administered 2024-12-26: 1000 mg via INTRAVENOUS
  Filled 2024-12-26: qty 50

## 2024-12-26 MED ORDER — HEPARIN (PORCINE) 25000 UT/250ML-% IV SOLN: 500.0000 [IU]/h | INTRAVENOUS | Status: DC

## 2024-12-26 MED ORDER — HEPARIN (PORCINE) 25000 UT/250ML-% IV SOLN
500.0000 [IU]/h | INTRAVENOUS | Status: DC
  Administered 2024-12-26: 500 [IU]/h via INTRAVENOUS
  Filled 2024-12-26: qty 250

## 2024-12-26 MED ORDER — POTASSIUM PHOSPHATES 15 MMOLE/5ML IV SOLN
30.0000 mmol | Freq: Once | INTRAVENOUS | Status: AC
  Administered 2024-12-26: 30 mmol via INTRAVENOUS
  Filled 2024-12-26: qty 10

## 2024-12-26 MED ORDER — POTASSIUM CHLORIDE 20 MEQ PO PACK
40.0000 meq | PACK | Freq: Once | ORAL | Status: AC
  Administered 2024-12-26: 40 meq
  Filled 2024-12-26: qty 2

## 2024-12-26 MED ORDER — ORAL CARE MOUTH RINSE
15.0000 mL | OROMUCOSAL | Status: DC
  Administered 2024-12-26 – 2024-12-27 (×11): 15 mL via OROMUCOSAL

## 2024-12-26 MED ORDER — METHOCARBAMOL 500 MG PO TABS
500.0000 mg | ORAL_TABLET | Freq: Three times a day (TID) | ORAL | Status: DC
  Administered 2024-12-26 – 2024-12-27 (×3): 500 mg
  Filled 2024-12-26 (×4): qty 1

## 2024-12-26 MED ORDER — GABAPENTIN 250 MG/5ML PO SOLN
200.0000 mg | Freq: Every day | ORAL | Status: DC
  Administered 2024-12-26: 200 mg
  Filled 2024-12-26 (×2): qty 4

## 2024-12-26 MED ORDER — FUROSEMIDE 10 MG/ML IJ SOLN
80.0000 mg | Freq: Once | INTRAMUSCULAR | Status: AC
  Administered 2024-12-26: 80 mg via INTRAVENOUS
  Filled 2024-12-26: qty 8

## 2024-12-26 MED ORDER — INSULIN ASPART 100 UNIT/ML IJ SOLN
3.0000 [IU] | INTRAMUSCULAR | Status: DC
  Administered 2024-12-26 – 2024-12-27 (×6): 3 [IU] via SUBCUTANEOUS
  Filled 2024-12-26 (×6): qty 3

## 2024-12-26 MED ORDER — PANTOPRAZOLE SODIUM 40 MG IV SOLR
40.0000 mg | INTRAVENOUS | Status: DC
  Administered 2024-12-26 – 2024-12-27 (×2): 40 mg via INTRAVENOUS
  Filled 2024-12-26 (×2): qty 10

## 2024-12-26 MED ORDER — DOCUSATE SODIUM 50 MG/5ML PO LIQD: 100.0000 mg | Freq: Two times a day (BID) | ORAL | Status: DC

## 2024-12-26 MED ORDER — SODIUM CHLORIDE 0.9% IV SOLUTION: Freq: Once | INTRAVENOUS | Status: AC

## 2024-12-26 NOTE — Progress Notes (Signed)
 "  NAME:  Karen Warner, MRN:  [id number], DOB:  1951-06-10, LOS: 11 ADMISSION DATE:  12/14/2024, CONSULTATION DATE:  1/23 REFERRING MD:  Lucas, CHIEF COMPLAINT:  post-LVAD   History of Present Illness:  Karen Warner is a 74 y/o woman with a history of ICM and HFrEF who presented on 1/14 with chest pain and decompensated heart failure. She underwent left and right heart catheterization this admission showing low CO and low filling pressures.  She was managed with inotropic support. Due to progression of disease and poor tolerance of GDMT, she was evaluated for LVAD.  Intraoperatively her course was uncomplicated other than low flows on LVAD when her BP went up at the end of the case when she was stimulated. She transferred to the ICU on epi 2, NE 1, milrinone  0.364mcg. TEE with normal RV function, evidence of intrapulmonary shunt.  Paralytics not reversed in ICU. Bypass time , crossclamp time 29 min, cell saver 225cc. LVAD speed 4600 RPM. Alarms on LVAD went off during periods of hypertension post-op, controlled by afterload reduction.   Pertinent  Medical History  HFrEF due to ICM DM2 IDA FM CKD3  Significant Hospital Events: Including procedures, antibiotic start and stop dates in addition to other pertinent events   1/14 admission 1/15 L&R heart cath> nonobstructive disease, low filling pressures 1/23 LVAD 1/24 extubated; fluid boluses & milrinone  increased due to frequent LVAD alarms with high PI 1/25 reintubated for respiratory distress, worsening lactic acidosis  Interim History / Subjective:  Tmax 100.4. Less suction alarms on VAD since reintubation. Remains on epi 1, NE 4, milrinone  0.330mcg.  Objective    Blood pressure (!) 79/63, pulse (!) 105, temperature 100.2 F (37.9 C), resp. rate (!) 25, height 5' 5 (1.651 m), weight 83.3 kg, SpO2 99%. PAP: (15-38)/(9-32) 26/21 CVP:  [1 mmHg-21 mmHg] 7 mmHg CO:  [3.9 L/min-5.9 L/min] 4.9 L/min CI:  [2.2 L/min/m2-3.3  L/min/m2] 2.7 L/min/m2  Vent Mode: PRVC FiO2 (%):  [40 %-100 %] 40 % Set Rate:  [25 bmp] 25 bmp Vt Set:  [450 mL] 450 mL PEEP:  [8 cmH20] 8 cmH20 Plateau Pressure:  [19 cmH20-20 cmH20] 19 cmH20   Intake/Output Summary (Last 24 hours) at 12/26/2024 0645 Last data filed at 12/26/2024 0600 Gross per 24 hour  Intake 2013.8 ml  Output 1735 ml  Net 278.8 ml   Filed Weights   12/24/24 0500 12/25/24 0500 12/26/24 0500  Weight: 84.4 kg 83.1 kg 83.3 kg    Examination: General: critically ill appearing woman lying in bed in NAD HENT: /AT, eyes anicteric Lungs: breathing comfortably on MV, no rhales or wheezing.  Cardiovascular: serosanguinous chest tube output, mechanical hum ov LVAD Abdomen: soft, NT Extremities: no significant edema, L radial Aline Neuro: RASS -4 GU: foley with clear yellow urine  I/O +400cc, net +9.6L  Coox 61% 7.42/29/147/19 BUN 23 Cr 1.34 T bili 0.6 LDH 535 WBC 21 H/H 7.6/22.7 Platelets 184 INR 1.6 BG 170-220s  CXR personally reviewed> mild pulmonary edema, no obvious lobar infiltrates. LVAD, barostim, AICD, Swan, ETT.    Resolved problem list   Assessment and Plan   Acute on chronic HFrEF due to ICM; NYHA class IV heart failure s/p LVAD on 1/23 -post-op care per TCTS; pulling pacing wires today. Keep remaining chest tube. -con't CCO swan -inotropes per AHF- milrinone  0.375, epi 1, NE titrating for MAP 70-90 -pain control per PAD protocol while intubated -con't iNO; will discuss timeline of weaning with AHF -start low dose  heparin  today; holding on warfarin -lasix  x 1 dose  Acute respiratory failure with hypoxia; reintubated for WOB and lactic acidosis, presumably related to acute infection.  -LTVV -VAP prevention protocol -PAD protocol for sedation -daily SAT & SBT as appropriate -brovana  & yupelri   Fevers, leukocytosis- initially suspected post-op inflammation, but rapid clinical progression more c/w infection-- no clear etiology.   -con't vanc & zosyn   Hyperglycemia; h/o DM. A1c 6.6 -con't glargine to 15 units daily -SSI PRN; keep q4h for now -adding TF coverage 3 units q4h with hold parameters -goal BG 140-180  Anemia -transfuse 1 unit pRBC  GERD -PPI  H/o migraine -family brought in PTA Ubrelvy - con't this  H/o FM -con't PTA trintellix  -need to opimize mobility and sleep once extubation  CKD 3b -strict I/O -renally dose meds, avoid nephrotoxic meds  At risk for malnutrition -trickle TF   Plan discussed with TCTS.   Labs   CBC: Recent Labs  Lab 12/23/24 1851 12/23/24 2000 12/24/24 0406 12/24/24 0523 12/24/24 1700 12/24/24 1822 12/25/24 0044 12/25/24 0414 12/25/24 0515 12/26/24 0454 12/26/24 0455  WBC 14.3*  --  17.6*  --  18.5*  --   --  23.6*  --  21.0*  --   NEUTROABS  --   --  13.6*  --   --   --   --  19.9*  --  17.1*  --   HGB 9.2*  --  9.1*   < > 9.4*   < > 7.8* 8.5* 8.8* 7.6* 7.8*  HCT 27.2*  --  28.3*   < > 28.3*   < > 23.0* 25.7* 26.0* 22.7* 23.0*  MCV 93.8  --  94.3  --  95.6  --   --  96.3  --  94.2  --   PLT 170 182 202  --  198  --   --  182  --  184  --    < > = values in this interval not displayed.    Basic Metabolic Panel: Recent Labs  Lab 12/24/24 0406 12/24/24 0523 12/24/24 1700 12/24/24 1822 12/25/24 0414 12/25/24 0515 12/25/24 2051 12/26/24 0454 12/26/24 0455  NA 140   < > 136   < > 136 138 138 138 139  K 4.9   < > 4.0   < > 4.3 4.0 3.9 3.6 3.5  CL 108  --  107  --  106  --  107 108  --   CO2 19*  --  18*  --  19*  --  19* 19*  --   GLUCOSE 180*  --  156*  --  181*  --  195* 231*  --   BUN 12  --  10  --  11  --  19 23  --   CREATININE 1.52*  --  1.30*  --  1.15*  --  1.38* 1.34*  --   CALCIUM  8.5*  --  8.5*  --  8.4*  --  8.1* 8.2*  --   MG 2.8*  --  2.5*  --  2.4  --  2.6* 2.7*  --   PHOS 2.9  --   --   --  2.0*  --   --  2.1*  --    < > = values in this interval not displayed.   GFR: Estimated Creatinine Clearance: 39.2 mL/min (A) (by C-G  formula based on SCr of 1.34 mg/dL (H)). Recent Labs  Lab 12/19/24 1938 12/20/24 0428 12/24/24 0406  12/24/24 1700 12/25/24 0044 12/25/24 0414 12/25/24 1051 12/25/24 1344 12/26/24 0454  WBC  --    < > 17.6* 18.5*  --  23.6*  --   --  21.0*  LATICACIDVEN 0.9  --   --   --  0.9  --  3.9* 1.7  --    < > = values in this interval not displayed.    Critical care time:     This patient is critically ill with multiple organ system failure which requires frequent high complexity decision making, assessment, support, evaluation, and titration of therapies. This was completed through the application of advanced monitoring technologies and extensive interpretation of multiple databases. During this encounter critical care time was devoted to patient care services described in this note for 39 minutes.   Leita [name], DO 12/26/24 9:45 AM [facility]  For contact information, see Amion. If no response to pager, please call PCCM 2H APP. After hours, 7PM- 7AM, please call on call APP for 2H.       "

## 2024-12-26 NOTE — Progress Notes (Signed)
 PHARMACY - ANTICOAGULATION CONSULT NOTE  Pharmacy Consult for heparin  Indication: LVAD   Allergies[1]  Patient Measurements: Height: 5' 5 (165.1 cm) Weight: 83.3 kg (183 lb 10.3 oz) IBW/kg (Calculated) : 57 HEPARIN  DW (KG): 72  Vital Signs: Temp: 101.1 F (38.4 C) (01/26 1345) Temp Source: Bladder (01/26 1200) BP: 90/59 (01/26 0822) Pulse Rate: 106 (01/26 1345)  Labs: Recent Labs    12/23/24 2000 12/24/24 0406 12/24/24 0523 12/24/24 1700 12/24/24 1822 12/25/24 0414 12/25/24 0515 12/25/24 2051 12/26/24 0454 12/26/24 0455  HGB  --  9.1*   < > 9.4*   < > 8.5* 8.8*  --  7.6* 7.8*  HCT  --  28.3*   < > 28.3*   < > 25.7* 26.0*  --  22.7* 23.0*  PLT 182 202  --  198  --  182  --   --  184  --   APTT 74*  --   --   --   --   --   --   --   --   --   LABPROT 16.7* 16.5*  --   --   --  19.3*  --   --  20.2*  --   INR 1.3* 1.3*  --   --   --  1.5*  --   --  1.6*  --   CREATININE  --  1.52*  --  1.30*  --  1.15*  --  1.38* 1.34*  --    < > = values in this interval not displayed.    Estimated Creatinine Clearance: 39.2 mL/min (A) (by C-G formula based on SCr of 1.34 mg/dL (H)).   Medical History: Past Medical History:  Diagnosis Date   AICD (automatic cardioverter/defibrillator) present    Anemia 12/12/2019   Anginal pain    Arthralgia 01/14/2020   Arthritis    Asthma    Atherosclerotic heart disease of native coronary artery without angina pectoris 04/13/2014   CHF (congestive heart failure) (HCC)    Chronic bilateral low back pain with bilateral sciatica 01/13/2019   Chronic bladder pain 11/08/2014   Last Assessment & Plan:  Formatting of this note might be different from the original. Patient has chronic pain syndrome in general I think this is unfortunately worsening her pelvic pain and bladder pain her examination today was very reassuring I am advised her to use the estrogen cream I did start her on Elavil 10 milligrams at that time if she does tolerated will  increase it to 25 milligrams.    Chronic headaches    Chronic idiopathic constipation 12/22/2014   Formatting of this note might be different from the original. Last Assessment & Plan:  For better bowel emptying please use either Citrucel / Benefiber start with 2 tablespoons daily and titrate up or down to effect.  Also please use glycerin  suppositories as needed to assist in evacuation. Last Assessment & Plan:  Formatting of this note might be different from the original. For better bowel empt   Chronic obstructive pulmonary disease, unspecified (HCC) 03/18/2018   CKD (chronic kidney disease) stage 3, GFR 30-59 ml/min (HCC)    Class 1 obesity due to excess calories with serious comorbidity and body mass index (BMI) of 31.0 to 31.9 in adult 06/26/2020   Colon polyps    COPD (chronic obstructive pulmonary disease) (HCC)    Coronary artery disease    DDD (degenerative disc disease), cervical 01/13/2019   Depression 11/05/2019   Diabetic polyneuropathy associated with  type 2 diabetes mellitus (HCC) 02/24/2019   Diverticulitis 05/20/2018   Diverticulitis of colon 03/18/2018   Family history of ischemic heart disease (IHD) 08/16/2013   Fibromyalgia    Fibromyalgia affecting multiple sites 01/14/2020   Gastro-esophageal reflux disease without esophagitis 01/13/2019   Glaucoma 11/08/2014   Hordeolum externum of left upper eyelid 03/13/2020   Hypertension 11/08/2014   IBS (irritable bowel syndrome)    Iron deficiency anemia 01/13/2019   Left renal mass 11/08/2019   Leukocytosis 05/28/2018   Low vitamin B12 level 03/13/2020   Low vitamin D  level 12/12/2019   LV dysfunction 05/31/2019   Malignant essential hypertension 01/22/2016   Migraine, unspecified, not intractable, without status migrainosus 11/08/2014   Mixed hyperlipidemia 01/13/2019   Myocardial infarction (HCC)    Other premature beats 11/08/2014   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 Last Assessment & Plan:  For vaginal atrophy please place a pea size dab of Estrogen vaginal cream  into the vagina 3 times a week ( Monday, Wednesday, Friday) Last Assessment & Plan:  Formatting of this note might be different from the original. For vaginal atrophy please place a pea size dab of Estrogen vaginal cream  into the vagina     Medications:  Infusions:   clevidipine  Stopped (12/25/24 1553)   dexmedetomidine  (PRECEDEX ) IV infusion 0.7 mcg/kg/hr (12/26/24 1300)   epinephrine  1 mcg/min (12/26/24 1300)   fentaNYL  infusion INTRAVENOUS 100 mcg/hr (12/26/24 1300)   heparin      milrinone  0.375 mcg/kg/min (12/26/24 1300)   norepinephrine  (LEVOPHED ) Adult infusion 5 mcg/min (12/26/24 1300)   piperacillin -tazobactam (ZOSYN )  IV 3.375 g (12/26/24 1400)   vancomycin       Assessment: Patient is a 74 year old female with LVAD placed 1/23. She was not on anticoagulation PTA. Pacing wires were pulled today. Pharmacy consulted to start heparin  for new LVAD.   Hgb trending down at 7.8. PLT stable at 184. No concerns for bleeding.   Goal of Therapy:  Heparin  level <0.3 Monitor platelets by anticoagulation protocol: Yes   Plan:  Start heparin  infusion at 500 units/hr  Do not titrate up Heparin  level in 6 hours  Daily CBC and Heparin  level  Monitor signs and symptoms of bleeding   Dorismar Chay M Drue Camera 12/26/2024,2:03 PM      [1]  Allergies Allergen Reactions   Bee Venom Anaphylaxis   Sumatriptan Shortness Of Breath    Migraine worsened   Amoxicillin-Pot Clavulanate Diarrhea and Nausea And Vomiting    GI Intolerance   Oxycodone  Itching and Hives     Other reaction(s): Other (see comments) Funny feeling in head  off the edge    Buprenorphine Hcl     Other reaction(s): Itching   Clarithromycin Diarrhea    Abdominal pain   Duloxetine  Hcl Other (See Comments)    drowsiness   Hydrocodone Itching   Lactose Intolerance (Gi) Other (See Comments)    Upset stomach, gas/bloating   Liraglutide Other (See Comments)    Abdominal discomfort   Tramadol Hcl Itching

## 2024-12-26 NOTE — Progress Notes (Addendum)
 PHARMACY - ANTICOAGULATION CONSULT NOTE  Pharmacy Consult for heparin  Indication: LVAD   Allergies[1]  Patient Measurements: Height: 5' 5 (165.1 cm) Weight: 83.3 kg (183 lb 10.3 oz) IBW/kg (Calculated) : 57 HEPARIN  DW (KG): 72  Vital Signs: Temp: 99.9 F (37.7 C) (01/26 1937) Temp Source: Bladder (01/26 1600) BP: 88/54 (01/26 1937) Pulse Rate: 111 (01/26 1937)  Labs: Recent Labs    12/24/24 0406 12/24/24 0523 12/24/24 1700 12/24/24 1822 12/25/24 0414 12/25/24 0515 12/25/24 2051 12/26/24 0454 12/26/24 0455 12/26/24 1800  HGB 9.1*   < > 9.4*   < > 8.5* 8.8*  --  7.6* 7.8*  --   HCT 28.3*   < > 28.3*   < > 25.7* 26.0*  --  22.7* 23.0*  --   PLT 202  --  198  --  182  --   --  184  --   --   LABPROT 16.5*  --   --   --  19.3*  --   --  20.2*  --   --   INR 1.3*  --   --   --  1.5*  --   --  1.6*  --   --   HEPARINUNFRC  --   --   --   --   --   --   --   --   --  <0.10*  CREATININE 1.52*  --  1.30*  --  1.15*  --  1.38* 1.34*  --   --    < > = values in this interval not displayed.    Estimated Creatinine Clearance: 39.2 mL/min (A) (by C-G formula based on SCr of 1.34 mg/dL (H)).   Medical History: Past Medical History:  Diagnosis Date   AICD (automatic cardioverter/defibrillator) present    Anemia 12/12/2019   Anginal pain    Arthralgia 01/14/2020   Arthritis    Asthma    Atherosclerotic heart disease of native coronary artery without angina pectoris 04/13/2014   CHF (congestive heart failure) (HCC)    Chronic bilateral low back pain with bilateral sciatica 01/13/2019   Chronic bladder pain 11/08/2014   Last Assessment & Plan:  Formatting of this note might be different from the original. Patient has chronic pain syndrome in general I think this is unfortunately worsening her pelvic pain and bladder pain her examination today was very reassuring I am advised her to use the estrogen cream I did start her on Elavil 10 milligrams at that time if she does  tolerated will increase it to 25 milligrams.    Chronic headaches    Chronic idiopathic constipation 12/22/2014   Formatting of this note might be different from the original. Last Assessment & Plan:  For better bowel emptying please use either Citrucel / Benefiber start with 2 tablespoons daily and titrate up or down to effect.  Also please use glycerin  suppositories as needed to assist in evacuation. Last Assessment & Plan:  Formatting of this note might be different from the original. For better bowel empt   Chronic obstructive pulmonary disease, unspecified (HCC) 03/18/2018   CKD (chronic kidney disease) stage 3, GFR 30-59 ml/min (HCC)    Class 1 obesity due to excess calories with serious comorbidity and body mass index (BMI) of 31.0 to 31.9 in adult 06/26/2020   Colon polyps    COPD (chronic obstructive pulmonary disease) (HCC)    Coronary artery disease    DDD (degenerative disc disease), cervical 01/13/2019   Depression 11/05/2019  Diabetic polyneuropathy associated with type 2 diabetes mellitus (HCC) 02/24/2019   Diverticulitis 05/20/2018   Diverticulitis of colon 03/18/2018   Family history of ischemic heart disease (IHD) 08/16/2013   Fibromyalgia    Fibromyalgia affecting multiple sites 01/14/2020   Gastro-esophageal reflux disease without esophagitis 01/13/2019   Glaucoma 11/08/2014   Hordeolum externum of left upper eyelid 03/13/2020   Hypertension 11/08/2014   IBS (irritable bowel syndrome)    Iron deficiency anemia 01/13/2019   Left renal mass 11/08/2019   Leukocytosis 05/28/2018   Low vitamin B12 level 03/13/2020   Low vitamin D  level 12/12/2019   LV dysfunction 05/31/2019   Malignant essential hypertension 01/22/2016   Migraine, unspecified, not intractable, without status migrainosus 11/08/2014   Mixed hyperlipidemia 01/13/2019   Myocardial infarction (HCC)    Other premature beats 11/08/2014   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 Last Assessment & Plan:  For vaginal atrophy please place a pea size dab of Estrogen vaginal cream  into the vagina 3 times a week ( Monday, Wednesday, Friday) Last Assessment & Plan:  Formatting of this note might be different from the original. For vaginal atrophy please place a pea size dab of Estrogen vaginal cream  into the vagina     Medications:  Infusions:   clevidipine  Stopped (12/25/24 1553)   dexmedetomidine  (PRECEDEX ) IV infusion 0.7 mcg/kg/hr (12/26/24 1900)   epinephrine  1 mcg/min (12/26/24 1900)   fentaNYL  infusion INTRAVENOUS 100 mcg/hr (12/26/24 1900)   heparin  500 Units/hr (12/26/24 1900)   milrinone  0.375 mcg/kg/min (12/26/24 1900)   norepinephrine  (LEVOPHED ) Adult infusion 2 mcg/min (12/26/24 1900)   piperacillin -tazobactam (ZOSYN )  IV Stopped (12/26/24 1805)   vancomycin  Stopped (12/26/24 1604)    Assessment: Patient is a 74 year old female with LVAD placed 1/23. She was not on anticoagulation PTA. Pacing wires were pulled today. Pharmacy consulted to start heparin  for new LVAD.   Hgb trending down at 7.8. PLT stable at 184. No concerns for bleeding.   2nd shift update: heparin  level < 0.1, not titrating. No issues of bleeding per RN.   Goal of Therapy:  Heparin  level <0.3 Monitor platelets by anticoagulation protocol: Yes   Plan:  Continue heparin  infusion at 500 units/hr  Do not titrate up Heparin  level in 6 hours  Daily CBC and Heparin  level  Monitor signs and symptoms of bleeding   Rankin Sams 12/26/2024,8:01 PM       [1]  Allergies Allergen Reactions   Bee Venom Anaphylaxis   Sumatriptan  Shortness Of Breath    Migraine worsened   Amoxicillin-Pot Clavulanate Diarrhea and Nausea And Vomiting    GI Intolerance   Oxycodone  Itching and Hives    Other reaction(s): Other (see comments) Funny feeling in head  off the edge    Buprenorphine Hcl     Other reaction(s): Itching   Clarithromycin Diarrhea    Abdominal pain   Duloxetine  Hcl Other (See Comments)    drowsiness   Hydrocodone Itching   Lactose Intolerance (Gi) Other (See Comments)    Upset stomach, gas/bloating   Liraglutide Other (See Comments)    Abdominal discomfort  Tramadol Hcl Itching

## 2024-12-26 NOTE — Progress Notes (Signed)
 HeartMate 3 Rounding Note  Subjective:    Hemodynamics stable overnight. Was able to tolerate higher MAP.  Milrinone  0.375, epi 1, NE 4   CI 2.7, CVP 7-9.  Co-ox 61.3  Remains sedated on vent.   +370 cc/24 hrs.   LVAD INTERROGATION:  HeartMate IIl LVAD:  Flow 2.9 liters/min, speed 4800, power 3, PI 6.    Objective:    Vital Signs:   Temp:  [97.7 F (36.5 C)-100.4 F (38 C)] 100.4 F (38 C) (01/26 0700) Pulse Rate:  [90-215] 108 (01/26 0700) Resp:  [0-47] 25 (01/26 0700) SpO2:  [63 %-100 %] 99 % (01/26 0700) Arterial Line BP: (42-133)/(27-81) 88/63 (01/26 0700) FiO2 (%):  [40 %-100 %] 40 % (01/26 0400) Weight:  [83.3 kg] 83.3 kg (01/26 0500) Last BM Date : 12/25/24 Mean arterial Pressure 75  Intake/Output:   Intake/Output Summary (Last 24 hours) at 12/26/2024 0708 Last data filed at 12/26/2024 0700 Gross per 24 hour  Intake 2104.53 ml  Output 1735 ml  Net 369.53 ml     Physical Exam: General:  sedated on vent HEENT: intubated Cor: Distant heart sounds with LVAD hum present. Lungs: clear Chest dressing dry Abdomen: soft, nontender, non-distended, few bowel sounds. Extremities: mild edema Neuro: alert & orientedx3, cranial nerves grossly intact. moves all 4 extremities w/o difficulty. Affect pleasant  Telemetry: internally sensing A, pacing V low 100's  Labs: Basic Metabolic Panel: Recent Labs  Lab 12/24/24 0406 12/24/24 0523 12/24/24 1700 12/24/24 1822 12/25/24 0414 12/25/24 0515 12/25/24 2051 12/26/24 0454 12/26/24 0455  NA 140   < > 136   < > 136 138 138 138 139  K 4.9   < > 4.0   < > 4.3 4.0 3.9 3.6 3.5  CL 108  --  107  --  106  --  107 108  --   CO2 19*  --  18*  --  19*  --  19* 19*  --   GLUCOSE 180*  --  156*  --  181*  --  195* 231*  --   BUN 12  --  10  --  11  --  19 23  --   CREATININE 1.52*  --  1.30*  --  1.15*  --  1.38* 1.34*  --   CALCIUM  8.5*  --  8.5*  --  8.4*  --  8.1* 8.2*  --   MG 2.8*  --  2.5*  --  2.4  --  2.6* 2.7*  --    PHOS 2.9  --   --   --  2.0*  --   --  2.1*  --    < > = values in this interval not displayed.    Liver Function Tests: Recent Labs  Lab 12/19/24 1832 12/23/24 0223 12/24/24 0406 12/25/24 0414 12/26/24 0454  AST 18 19 117* 123* 77*  ALT 8 8 12 11 8   ALKPHOS 54 62 52 61 68  BILITOT 0.3 0.3 0.6 1.6* 0.6  PROT 6.2* 7.3 5.9* 6.1* 5.6*  ALBUMIN  3.3* 4.0 3.8 3.9 3.4*   No results for input(s): LIPASE, AMYLASE in the last 168 hours. No results for input(s): AMMONIA in the last 168 hours.  CBC: Recent Labs  Lab 12/23/24 1851 12/23/24 2000 12/24/24 0406 12/24/24 0523 12/24/24 1700 12/24/24 1822 12/25/24 0044 12/25/24 0414 12/25/24 0515 12/26/24 0454 12/26/24 0455  WBC 14.3*  --  17.6*  --  18.5*  --   --  23.6*  --  21.0*  --   NEUTROABS  --   --  13.6*  --   --   --   --  19.9*  --  17.1*  --   HGB 9.2*  --  9.1*   < > 9.4*   < > 7.8* 8.5* 8.8* 7.6* 7.8*  HCT 27.2*  --  28.3*   < > 28.3*   < > 23.0* 25.7* 26.0* 22.7* 23.0*  MCV 93.8  --  94.3  --  95.6  --   --  96.3  --  94.2  --   PLT 170 182 202  --  198  --   --  182  --  184  --    < > = values in this interval not displayed.    INR: Recent Labs  Lab 12/23/24 1329 12/23/24 2000 12/24/24 0406 12/25/24 0414 12/26/24 0454  INR 1.3* 1.3* 1.3* 1.5* 1.6*    Other results: EKG:   Imaging: DG CHEST PORT 1 VIEW Result Date: 12/26/2024 EXAM: 1 VIEW(S) XRAY OF THE CHEST 12/26/2024 05:44:31 AM COMPARISON: 12/25/2024 CLINICAL HISTORY: Left ventricular assist device present. HCC. FINDINGS: LINES, TUBES AND DEVICES: Right internal jugular Swan-Ganz catheter with tip over the main pulmonary artery, directed towards the right pulmonary artery. Endotracheal tube 1.5 cm above carina. Enteric tube courses below diaphragm with tip out of field of view. Right chest neural stimulator in place. Left chest ICD in place. LVAD in place. LUNGS AND PLEURA: Left basilar atelectasis. Possible trace bilateral pleural effusions. No  pneumothorax. HEART AND MEDIASTINUM: Cardiomegaly. BONES AND SOFT TISSUES: Status post median sternotomy. No acute osseous abnormality. IMPRESSION: 1. Left ventricular assist device in place. 2. Left basilar atelectasis and possible trace bilateral pleural effusions. Electronically signed by: Waddell Calk MD 12/26/2024 06:11 AM EST RP Workstation: [id number]   DG Abd Portable 1V Result Date: 12/25/2024 CLINICAL DATA:  NG tube placement. EXAM: PORTABLE ABDOMEN - 1 VIEW COMPARISON:  08/24/2023, 12/25/2024. FINDINGS: The bowel gas pattern is normal. An enteric tube terminates in the stomach and appears appropriate in position. There stable cardiomegaly with LVAD in place. An AICD device is noted over the left chest. Swan-Ganz catheter appear stable. An endotracheal tube terminates 2.5 cm above the carina. A stable neurostimulator device is noted on the right. Sternotomy wires are present over the midline. IMPRESSION: 1. Enteric tube terminates in the stomach. 2. Remaining support apparatus appear stable. Electronically Signed   By: Leita Waddell M.D.   On: 12/25/2024 16:48   DG CHEST PORT 1 VIEW Result Date: 12/25/2024 CLINICAL DATA:  Intubation. EXAM: PORTABLE CHEST 1 VIEW COMPARISON:  12/25/2024. FINDINGS: The heart is enlarged and the mediastinal contour stable. There is atherosclerotic calcification of the aorta. An LVAD device is noted. There is evidence of prior cardiothoracic surgery. A right Swan-Ganz catheter curls over the anticipated region of the pulmonary outflow tract. An AICD is in place on the left. A neurostimulator device with lead coursing over the cervical soft tissues. An endotracheal tube terminates 1.8 cm above the carina. Mild airspace disease is noted at the lung bases. No obvious effusion or pneumothorax is seen. Sternotomy wires are present over the midline. IMPRESSION: 1. Mild atelectasis at the lung bases. 2. Support apparatus as described above. Electronically Signed   By: Leita Waddell M.D.   On: 12/25/2024 13:03   DG Chest Port 1 View Result Date: 12/25/2024 EXAM: 1 VIEW(S) XRAY OF THE CHEST 12/25/2024 01:21:34 AM COMPARISON: 12/24/2024 CLINICAL HISTORY: Acute hypoxic  respiratory failure. ICD10: 8228802 Acute hypoxic respiratory failure (HCC). FINDINGS: LINES, TUBES AND DEVICES: Endotracheal tube and enteric tube removed. Right IJ Swan-Ganz catheter in place with tip overlying the expected region of the main pulmonary artery. Right thoracostomy tubes in place. LUNGS AND PLEURA: No focal pulmonary opacity. No pleural effusion. No pneumothorax. HEART AND MEDIASTINUM: Stable cardiomegaly. LVAD in place. Dual chamber AICD in place. Neurostimulator device overlies the right chest wall with its single lead at the right neck base. BONES AND SOFT TISSUES: Median sternotomy noted. No acute osseous abnormality. IMPRESSION: 1. No acute cardiopulmonary abnormality. 2. Interval removal of the endotracheal and enteric tubes. 3. Right IJ Swan-Ganz catheter tip overlies the expected region of the main pulmonary artery bifurcation 4. Dual right thoracostomy tubes in place. No pneumothorax. 5. LVAD, dual-chamber AICD, and right chest wall neurostimulator device in place. 6. Stable cardiomegaly Electronically signed by: Dorethia Molt MD 12/25/2024 01:28 AM EST RP Workstation: [id number]     Medications:     Scheduled Medications:  acetaminophen   1,000 mg Oral Q6H   Or   acetaminophen  (TYLENOL ) oral liquid 160 mg/5 mL  1,000 mg Per Tube Q6H   arformoterol   15 mcg Nebulization BID   bisacodyl   10 mg Oral Daily   Or   bisacodyl   10 mg Rectal Daily   Chlorhexidine  Gluconate Cloth  6 each Topical Daily   docusate sodium   200 mg Oral Daily   feeding supplement (PIVOT 1.5 CAL)  1,000 mL Per Tube Q24H   gabapentin   200 mg Oral QHS   insulin  aspart  0-24 Units Subcutaneous Q4H   insulin  aspart  3 Units Subcutaneous Q4H   insulin  glargine  15 Units Subcutaneous Daily   lidocaine   1 patch  Transdermal Q24H   methocarbamol   500 mg Oral Q8H   metoCLOPramide  (REGLAN ) injection  10 mg Intravenous Q6H   pantoprazole   40 mg Oral Daily   polyethylene glycol  17 g Per Tube Daily   revefenacin   175 mcg Nebulization Daily   senna  1 tablet Per Tube BID   sodium chloride  flush  3 mL Intravenous Q12H   valACYclovir   500 mg Oral Daily   Vitamin D  (Ergocalciferol )  50,000 Units Oral Q Sun   vortioxetine  HBr  5 mg Oral QHS    Infusions:  calcium  gluconate 50 mL/hr at 12/26/24 0700   clevidipine  Stopped (12/25/24 1553)   dexmedetomidine  (PRECEDEX ) IV infusion 0.7 mcg/kg/hr (12/26/24 0700)   epinephrine  1 mcg/min (12/26/24 0700)   fentaNYL  infusion INTRAVENOUS 100 mcg/hr (12/26/24 0700)   milrinone  0.375 mcg/kg/min (12/26/24 0700)   norepinephrine  (LEVOPHED ) Adult infusion 4 mcg/min (12/26/24 0700)   piperacillin -tazobactam (ZOSYN )  IV 12.5 mL/hr at 12/26/24 0700   potassium PHOSPHATE  IVPB (in mmol)     vancomycin       PRN Medications: dextrose , fentaNYL , fentaNYL  (SUBLIMAZE ) injection, HYDROmorphone , ondansetron  (ZOFRAN ) IV, mouth rinse, sodium chloride  flush, Ubrogepant    Assessment:   POD 3 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 Normal RV. Preop LVEF 20-25%. She has CRT-D  device and Barostim.   CAD with stable coronary anatomy per recent cath.   COPD with previous smoking until 2021.   Stage 3 CKD with creat around 1.5-1.6 preop.   Preop RUQ and epigastric abdominal pain with no evidence of acute cholecystitis on US  . Seen by GI. Felt to possibly be related to low CO.   Obesity.   Acute postop respiratory failure requiring reintubation yesterday. Appeared to be  developing sepsis with fever, leukocytosis, increased lactic acid. Started on vanc and Zosyn  with quick improvement in lactic acid. This could just be SIRS early postop. All lines and foley new from OR.  Acute postop blood loss anemia.  Plan/Discussion:    Hemodynamics  appear to be improving and she is tolerating higher MAP.  Filling pressures normal.   Probably benefit from transfusion.  CCM with decide about when to wean vent. CXR looks better this am with improvement in atelectasis.  DC temporary pacing wires.  Probably start heparin  today.  Keep pocket drain in for now.   I reviewed the LVAD parameters from today, and compared the results to the patient's prior recorded data.  No programming changes were made.  The LVAD is functioning within specified parameters.     Length of Stay: [address]  Dorise POUR [facility] 12/26/2024, 7:08 AM

## 2024-12-26 NOTE — Progress Notes (Signed)
 PT Cancellation Note  Patient Details Name: Karen Warner MRN: [id number] DOB: [birth year]   Cancelled Treatment:    Reason Eval/Treat Not Completed: (P) Patient not medically ready Pt remains intubated and on pressors. PT will follow back another day for Re-Evaluation.  [name] B. Fleeta Lapidus PT, DPT Acute Rehabilitation Services Please use secure chat or  Call Office [phone]   [name] KATHEE Fleeta [facility] 12/26/2024, 12:21 PM

## 2024-12-26 NOTE — Progress Notes (Signed)
 Palliative:  HPI: 74 y.o. 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 Required intubation 8/12.   I met today with 3 daughters, sister, brother, and Inge Lecher NP PCCM. We had discussion regarding path forward for one way extubation. We reviewed poor prognosis and expectation that JD will not do well once extubated. Family reiterate goal that he not suffer. We reviewed option to have medication available if he is having distress vs having medication already onboard to prevent distress. Family would like to have medication onboard to minimize any suffering during transition off ventilator. They would like to move forward with extubation later today and will have the rest of the family come to visit prior to extubation.   I reviewed plans and symptom management recommendations with RN.   Update: I was present with family prior to additional support as well as throughout extubation. Family appropriately tearful. Assisted to ensure comfort. I left family to visit privately with JD after he is resting comfortably after extubation.   All questions/concerns addressed. Emotional support provided. Much time coordinating care with RN, PCCM, and family.   Exam: Sedated on vent. FiO2 50%. Breathing regular, unlabored on vent. No distress. Not following commands. Abd soft. Warm to touch.   Plan:  - DNR - Extubated to full comfort care - Anticipate hospital death  100 min  Bernarda Kitty, NP Palliative Medicine Team Pager [phone] (Please see [email or web address] for schedule) Team Phone [phone]

## 2024-12-26 NOTE — Progress Notes (Signed)
 Nutrition Follow-up  DOCUMENTATION CODES:   Not applicable  INTERVENTION:   Discussed nutrition plan with Dr.Clark. Plan to hold TF at trickle rate today  Tube Feeding via OG:  Goal TF: Pivot 1.5 at 50 ml/hr TF at goal provides 113 g of protein, 1800 kcals and 912 mL of free water  NUTRITION DIAGNOSIS:   Increased nutrient needs related to chronic illness as evidenced by estimated needs.  Continues but being addressed via TF   GOAL:   Patient will meet greater than or equal to 90% of their needs  Progressing  MONITOR:   PO intake, Supplement acceptance, Labs, I & O's  REASON FOR ASSESSMENT:   Consult LVAD Eval  ASSESSMENT:   74 y.o. female present to the ED with chest pain. PMH includes GERD, CHF, T2DM, HLD, CAD, COPD, HTN, Glaucoma, diverticulitis, s/p pacemaker, [place] stimulator, fibromyalgia, and CKD III. Pt admitted with chest pain, acute on chronic HF, and AKI on CKD.  1/15 Admitted  1/23 HM3 LVAD implantation 1/24 Extubated, Cleviprex  started 1/25 Re-Intubated, pressors  Pt remains on vent support, sedated with precedex  and fentanyl  gtt. iNO Levophed  at 4, Epinephrine  at 1, Mirlinone 0.375  Pivot 1.5 started at trickle yesterday by MD via OG, currently tolerating at 20 ml/hr  OG tube in stomach per imaging  Per RN, pt with 2 semi-formed BMs in lat 24 hours. Prior to this, last BM on 1/19  Abd soft, BS present  Current Wt: 83.3 kg. Weight up post LVAD on 1/23. Pre-op wt of 73.8 kg  +edema, net +9 L since admission per I/O flowsheet  Labs: Phosphorus 2.1 (L) Magnesium  2.7 (H) BUN wdl, Creatinine 1.34 Sodium 138 (wdl) Potassium 3.6 (wdl) CBGs 163-222 (goal 140-180)  Meds: Dulcolax daily Colace daily Miralax  daily Senna BID Protonix  KCL SS Novolog  Novolog  q 4 hours Lantus  IV reglan   Diet Order:   Diet Order     None       EDUCATION NEEDS:   Education needs have been addressed  Skin:  Skin Assessment: Skin Integrity  Issues: Skin Integrity Issues:: Incisions Incisions: Driveline- new LVAD on 12/23/24  Last BM:  1/25  Height:   Ht Readings from Last 1 Encounters:  12/23/24 5' 5 (1.651 m)    Weight:   Wt Readings from Last 1 Encounters:  12/26/24 83.3 kg    BMI:  Body mass index is 30.56 kg/m.  Estimated Nutritional Needs:   Kcal:  1800-2000  Protein:  90-115 g  Fluid:  1.8 L   Betsey Finger MS, RDN, LDN, CNSC Registered Dietitian 3 Clinical Nutrition RD Inpatient Contact Info in Amion

## 2024-12-26 NOTE — Progress Notes (Signed)
" ° °  [address], [place] [place] [address]             [phone]   TCTS evening rounds  Stable day  BP (!) 90/59   Pulse (!) 110   Temp (!) 100.8 F (38.2 C)   Resp (!) 22   Ht 5' 5 (1.651 m)   Wt 83.3 kg   SpO2 100%   BMI 30.56 kg/m  21/16 CI 2.6 Epi 1 Norepi 3 Milrinone  0.375   Intake/Output Summary (Last 24 hours) at 12/26/2024 1641 Last data filed at 12/26/2024 1530 Gross per 24 hour  Intake 2969.49 ml  Output 3850 ml  Net -880.51 ml   Elspeth C. Kerrin, MD [facility] [phone]   "

## 2024-12-26 NOTE — Progress Notes (Signed)
 OT Cancellation Note  Patient Details Name: Karen Warner MRN: [id number] DOB: 03-06-1951   Cancelled Treatment:    Reason Eval/Treat Not Completed: Medical issues which prohibited therapy. Hold per RN who reports no plans to extubate today and with trials to reduce sedation pt not tolerating well. Will follow up as pt medically ready.   [name] [name], OTR/L [facility] Acute Rehabilitation Office: [phone]   [name] 12/26/2024, 9:39 AM

## 2024-12-26 NOTE — Progress Notes (Signed)
 LVAD Coordinator Rounding Note:  Admitted 12/14/24 due to CHF exacerbation. Underwent LVAD evaluation.   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  1/23: S/P HMIII Implant.Intra-op speed placed at 4600 due to low filling pressures.   1/24: Extubated. Had some low flows. Cleveprex started. 1/25: Re-intubated with desaturation, increased work of breathing 1/26: Remains intubated.Cleveprex stopped.    Pt intubated and sedated. Pt's daughter Mercy at bedside. Updated on plan of care by CCM and HF provider at bedside.   Multiple low flow alarms noted on interrogation. Most alarms coincide with HTN.   Chest xray with left basilar infiltrate- ?PNA. On Vanc/Zosyn . Febrile this morning.   Ramp echo performed at bedside. Speed increased to 5100.   Vital signs: Temp: 101.1 HR: 104 Doppler Pressure: 75 Arterial BP: 96/75 (84) O2 Sat: 100% on FiO2 40% Wt: 183.6 lbs    LVAD interrogation reveals:  Speed: 4800 >> 5100 Flow: 3.1 Power: 3.0 w PI: 6.1   Alarms: multiple low flow alarms Events:  none  Hematocrit: 23 Fixed speed: 4800 >> 5100 Low speed limit: 4500 >> 4800   Drive Line: Existing VAD dressing removed and site care performed using sterile technique. Drive line exit site cleaned with Chlora prep applicators x 2, allowed to dry, and Silverlon patch with gauze dressing applied. Exit site unincorporated, the velour is fully implanted at exit site. Small amount of sanguinous drainage noted on previous dressing. No redness, tenderness, foul odor or rash noted. Drive line anchor re-applied. Continue daily dressing changes by VAD coordinator or nurse champion. Next dressing change due 12/27/24.     Labs:  LDH trend: [phone]  INR trend: 1.3>1.5>1.6  WBC trend: 18.5>23.6>21.0  Hgb trend: 9.2>8.5>7.6  Anticoagulation Plan: - INR Goal: 2.0 - 2.5 - ASA Dose: none - Heparin  gtt per pharmacy - Coumadin  dosing per pharmacy  Blood Products:   - Intra-op 1/23:   2 PRBC  4 FFP  225 cc cell saver - Post op:  1/26: 1 PRBC  Device: - [employer] -Therapies: OFF  Arrythmias:   Respiratory: Extubated 1/24. Reintubated 1/25 due to respiratory distress. On Nitric 20 ppm.   Infection:   Renal:  -BUN/CRT: 10/1.3>11/1.15>23/1.34  Adverse Events on VAD: -  Drips:  Epinephrine  1 mcg/min Levophed  5 mcg/min Milrinone  0.375 mcg/kg/min Fentanyl  100 mcg/hr Precedex  0.7 mcg/kg/min Heparin  500 units/hr Tube feed 20 ml/hr  Patient Education:  Pt intubated and sedated. Education inappropriate at this time  Plan/Recommendations:  1. Page VAD coordinator with any drive line or equipment concerns 2. Daily drive line dressing changes per VAD coordinator or nurse champion  Isaiah Knoll RN VAD Coordinator  Office: [phone]  24/7 Pager: [phone]

## 2024-12-26 NOTE — Progress Notes (Addendum)
 Patient ID: Karen Warner Karen Warner, female   DOB: 04/08/1951, 74 y.o.   MRN: [id number]     Advanced Heart Failure Rounding Note  Cardiologist: Dub Huntsman, DO  [facility] Cardiologist: Dr Rolan  Chief Complaint: Post Op HMIII LVAD  Patient Profile   Nadene Witherspoon is a 74 y.o. female with history of CAD and ischemic cardiomyopathy, EF <20%, CKD III and COPD admitted w/ a/c CHF, chest/abdominal pain. RHC c/w low output.   Significant events:   1/15: RHC (RA 2, PA 21/10, PCW 3,  PA sat 59%, TD CI 1.49, FICK CI 1.88, PAPi 5.5), started on milrinone            LHC patent stents, nonobstructive CAD   1/19: co-ox persistently in 40s, milrinone  increased 0.375 12/23/24: S/P HMIII Implant.Intra-op speed placed at 4700 due to low filling pressures.  Received 2UPRBC, 3 FFP, cellsaver, 1 albumin , and 4 FFP  12/23/24 POCUS: VAD well positioned. RV looked ok but PAPI low with high CVP 12/24/24: Extubated. Had some low flows. Cleveprex started 1/25: Re-intubated with desaturation, increased work of breathing  Subjective:    Patient is intubated this morning, CXR with left basilar infiltrate.  Tm 100.5.  She is on vancomycin /Zosyn .   She is on epinephrine  1, NE 4, milrinone  0.375, iNO 20 ppm.  Weight stable.   Swan: CVP 10-11 PA 22/18 Continuous CO/CI  2.7 Co-ox 61%  LVAD Interrogation HM 3: Speed: 4800 Flow: 2.9 PI: 6.4 Power: 3.  No low flow alarms since yesterday morning.  Objective:   Weight Range: 83.3 kg Body mass index is 30.56 kg/m.   Vital Signs:   Temp:  [97.7 F (36.5 C)-100.4 F (38 C)] 100.4 F (38 C) (01/26 0700) Pulse Rate:  [90-215] 108 (01/26 0700) Resp:  [0-47] 25 (01/26 0700) SpO2:  [63 %-100 %] 99 % (01/26 0700) Arterial Line BP: (42-133)/(27-81) 88/63 (01/26 0700) FiO2 (%):  [40 %-100 %] 40 % (01/26 0400) Weight:  [83.3 kg] 83.3 kg (01/26 0500) Last BM Date : 12/25/24  Weight change: Filed Weights   12/24/24 0500 12/25/24 0500 12/26/24 0500  Weight:  84.4 kg 83.1 kg 83.3 kg    Intake/Output:   Intake/Output Summary (Last 24 hours) at 12/26/2024 0727 Last data filed at 12/26/2024 0700 Gross per 24 hour  Intake 2104.53 ml  Output 1735 ml  Net 369.53 ml     Physical Exam   General: Intubated HEENT: Normal. Neck: Supple, JVP 10 cm. Carotids OK.  Cardiac:  Mechanical heart sounds with LVAD hum present.  Lungs:  CTAB, normal effort.  Abdomen:  NT, ND, no HSM. No bruits or masses. +BS  LVAD exit site: Well-healed and incorporated. Dressing dry and intact. No erythema or drainage. Stabilization device present and accurately applied. Driveline dressing changed daily per sterile technique. Extremities:  Warm and dry. No cyanosis, clubbing, rash, or edema.  Neuro:  Alert & oriented x 3. Cranial nerves grossly intact. Moves all 4 extremities w/o difficulty. Affect pleasant     Telemetry   Sinus tachy with v-pacing Personally reviewed  Labs   CBC Recent Labs    12/25/24 0414 12/25/24 0515 12/26/24 0454 12/26/24 0455  WBC 23.6*  --  21.0*  --   NEUTROABS 19.9*  --  17.1*  --   HGB 8.5*   < > 7.6* 7.8*  HCT 25.7*   < > 22.7* 23.0*  MCV 96.3  --  94.2  --   PLT 182  --  184  --    < > =  values in this interval not displayed.   Basic Metabolic Panel Recent Labs    98/74/73 0414 12/25/24 0515 12/25/24 2051 12/26/24 0454 12/26/24 0455  NA 136   < > 138 138 139  K 4.3   < > 3.9 3.6 3.5  CL 106  --  107 108  --   CO2 19*  --  19* 19*  --   GLUCOSE 181*  --  195* 231*  --   BUN 11  --  19 23  --   CREATININE 1.15*  --  1.38* 1.34*  --   CALCIUM  8.4*  --  8.1* 8.2*  --   MG 2.4  --  2.6* 2.7*  --   PHOS 2.0*  --   --  2.1*  --    < > = values in this interval not displayed.   Liver Function Tests Recent Labs    12/25/24 0414 12/26/24 0454  AST 123* 77*  ALT 11 8  ALKPHOS 61 68  BILITOT 1.6* 0.6  PROT 6.1* 5.6*  ALBUMIN  3.9 3.4*   No results for input(s): LIPASE, AMYLASE in the last 72 hours. Cardiac  Enzymes No results for input(s): CKTOTAL, CKMB, CKMBINDEX, TROPONINI in the last 72 hours.  BNP: BNP (last 3 results) Recent Labs    09/01/24 1457  BNP 169.4*    ProBNP (last 3 results) Recent Labs    12/13/24 1529 12/14/24 0826 12/24/24 0406  PROBNP 1,342.0* 1,539.0* 3,345.0*     D-Dimer Recent Labs    12/23/24 2000  DDIMER 0.98*   Hemoglobin A1C No results for input(s): HGBA1C in the last 72 hours. Fasting Lipid Panel No results for input(s): CHOL, HDL, LDLCALC, TRIG, CHOLHDL, LDLDIRECT in the last 72 hours. Medications:   Scheduled Medications:  acetaminophen   1,000 mg Oral Q6H   Or   acetaminophen  (TYLENOL ) oral liquid 160 mg/5 mL  1,000 mg Per Tube Q6H   arformoterol   15 mcg Nebulization BID   bisacodyl   10 mg Oral Daily   Or   bisacodyl   10 mg Rectal Daily   Chlorhexidine  Gluconate Cloth  6 each Topical Daily   docusate  100 mg Per Tube BID   feeding supplement (PIVOT 1.5 CAL)  1,000 mL Per Tube Q24H   gabapentin   200 mg Per Tube QHS   insulin  aspart  0-24 Units Subcutaneous Q4H   insulin  aspart  3 Units Subcutaneous Q4H   insulin  glargine  15 Units Subcutaneous Daily   lidocaine   1 patch Transdermal Q24H   methocarbamol   500 mg Per Tube Q8H   metoCLOPramide  (REGLAN ) injection  10 mg Intravenous Q6H   pantoprazole  (PROTONIX ) IV  40 mg Intravenous Q24H   polyethylene glycol  17 g Per Tube Daily   revefenacin   175 mcg Nebulization Daily   senna  1 tablet Per Tube BID   sodium chloride  flush  3 mL Intravenous Q12H   valACYclovir   500 mg Per Tube Daily   Vitamin D  (Ergocalciferol )  50,000 Units Oral Q Sun   vortioxetine  HBr  5 mg Per Tube QHS    Infusions:  clevidipine  Stopped (12/25/24 1553)   dexmedetomidine  (PRECEDEX ) IV infusion 0.7 mcg/kg/hr (12/26/24 0700)   epinephrine  1 mcg/min (12/26/24 0700)   fentaNYL  infusion INTRAVENOUS 100 mcg/hr (12/26/24 0700)   milrinone  0.375 mcg/kg/min (12/26/24 0700)   norepinephrine   (LEVOPHED ) Adult infusion 4 mcg/min (12/26/24 0700)   piperacillin -tazobactam (ZOSYN )  IV 12.5 mL/hr at 12/26/24 0700   potassium PHOSPHATE  IVPB (in mmol)  vancomycin       PRN Medications: dextrose , fentaNYL , fentaNYL  (SUBLIMAZE ) injection, HYDROmorphone , ondansetron  (ZOFRAN ) IV, mouth rinse, sodium chloride  flush, Ubrogepant      Assessment/Plan   1. 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  - Intra Op Speed 4600 due to low filling pressures.  - Currently on milrinone  0.375 + epinephrine  1 + NE 4 + iNO. Wean NE as able today.  - After NE weaned, will work on transition from iNO to sildenafil  20 tid.  - Co-ox 61% with CI 2.7 by continuous cardiac output Swan but suspect RV failure, ?need for RV support if no improvement.  - Echo today, adjust speed as able => RV small, moderate dysfunction; interventricular septum bows to the right; the aortic valve does not open. Speed increased from 4800 step-wise to 5100.  Flow increased 3.0 L/min => 3.7 L/min, no ectopy.  LVIDD decreased appropriately.  At 5100 rpm, the IV septum still had some rightward shift but less than initially and the RV appeared to fill more.  - Patient re-intubated today by Dr. Gretta (Dr. Lucas also at bedside) - CVP 10-11, will give Lasix  80 mg IV x 1 and follow.  - INR 1.6, heparin  gtt to start today.   2.  Acute on chronic systolic CHF: Ischemic cardiomyopathy.  [employer] CRT-D device  - She is end-stage NYHA IV.  - s/p HM-3 VAD - management as above  3. Acute hypoxic respiratory failure - reintubated 1/25 - ?PNA, fever to 100.5 with left basilar infiltrate.  Now on vancomycin /Zosyn .   4. CAD: PCI to mid/distal LAD in 2020 and proximal LAD in 2021.   Cath this admit showed stable coronary anatomy. Suspect CP related to HF as above - Off ranolazine , imdur , plavix  - Restart Crestor  + Zetia  in a few days.   5. COPD: History of smoking, quit 2021.    6. CKD  3: Follows with nephology at [facility].  - Creatinine mildly higher 1.15 => 1.34.   7. Obesity: Has been on semaglutide.Now on hold.   8. PAD: Moderately decreased ABI on right in 7/25. Medical mgmt per Dr. Serene. Again ABI on 1/19, the R had moderate disease, and mild on the L. No claudication or other PAD symptoms.   9. Anemia: Post-op, hgb 7.6 today.  Transfuse hgb < 8.  - Transfuse 1 unit PRBCs.    10. FEN: TFs ongoing.    CRITICAL CARE Performed by: Ezra Shuck   Total critical care time: 60 minutes  Critical care time was exclusive of separately billable procedures and treating other patients.  Critical care was necessary to treat or prevent imminent or life-threatening deterioration.  Critical care was time spent personally by me on the following activities: development of treatment plan with patient and/or surrogate as well as nursing, discussions with consultants, evaluation of patient's response to treatment, examination of patient, obtaining history from patient or surrogate, ordering and performing treatments and interventions, ordering and review of laboratory studies, ordering and review of radiographic studies, pulse oximetry and re-evaluation of patient's condition.   Length of Stay: 66  Ezra Shuck, MD  12/26/2024, 7:27 AM  Advanced Heart Failure Team Pager [phone] (M-F; 7a - 5p)   Please visit [email or web address]: For overnight coverage please call cardiology fellow first. If fellow not available call Shock/ECMO MD on call.  For ECMO / Mechanical Support (Impella, IABP, LVAD) issues call Shock / ECMO MD on call.

## 2024-12-26 NOTE — Progress Notes (Signed)
 Speed  Flow  PI  Power  LVIDD  AI  Aortic opening MR  TR  Septum  RV  VTI (>18cm)  4800  3.1 6.1 3.0 4.9 none 0/5  none  none Bowing right Mod down     4900 3.5 5.5 3.1 4.8 none 0/5 none none Bowing right Mod down   5000  3.6 5.4 3.2 4.4 none 0/5 none none Slight bow right Mod down   5100  3.7 5.1 3.3 4.6 none 0/5 none none Slight bow right-midline Mod down                                Doppler MAP: 5100 Auto cuff BP: 4800   Ramp ECHO performed at bedside per Dr Rolan  At completion of ramp study, patients primary controller programmed:  Fixed speed: 5100 Low speed limit: 4800  Isaiah Knoll RN VAD Coordinator  Office: [phone]  24/7 Pager: [phone]

## 2024-12-26 NOTE — TOC Progression Note (Signed)
 Transition of Care [facility]) - Progression Note    Patient Details  Name: Karen Warner MRN: [id number] Date of Birth: 09/16/1951  Transition of Care [facility]) CM/SW Contact  [name] Phone Number: [phone] 12/26/2024, 10:35 AM  Clinical Narrative:   Per chart review, patient is not medically ready for dc. ICM will continue to follow and monitor for dc readiness.    HF CSW/CM will continue to follow and monitor for dc readiness.     Expected Discharge Plan: Home/Self Care Barriers to Discharge: Continued Medical Work up               Expected Discharge Plan and Services       Living arrangements for the past 2 months: Single Family Home                                       Social Drivers of Health (SDOH) Interventions SDOH Screenings   Food Insecurity: No Food Insecurity (12/15/2024)  Housing: Low Risk (12/17/2024)  Transportation Needs: No Transportation Needs (12/17/2024)  Utilities: Not At Risk (12/17/2024)  Depression (PHQ2-9): Low Risk (05/26/2024)  Financial Resource Strain: Low Risk (12/07/2024)   Received from [facility]  Social Connections: Moderately Integrated (12/17/2024)  Tobacco Use: Medium Risk (12/23/2024)    Readmission Risk Interventions     No data to display

## 2024-12-27 ENCOUNTER — Inpatient Hospital Stay (HOSPITAL_COMMUNITY)

## 2024-12-27 DIAGNOSIS — J9601 Acute respiratory failure with hypoxia: Secondary | ICD-10-CM | POA: Diagnosis not present

## 2024-12-27 DIAGNOSIS — I5023 Acute on chronic systolic (congestive) heart failure: Secondary | ICD-10-CM | POA: Diagnosis not present

## 2024-12-27 DIAGNOSIS — R739 Hyperglycemia, unspecified: Secondary | ICD-10-CM | POA: Diagnosis not present

## 2024-12-27 DIAGNOSIS — N1832 Chronic kidney disease, stage 3b: Secondary | ICD-10-CM | POA: Diagnosis not present

## 2024-12-27 DIAGNOSIS — D72829 Elevated white blood cell count, unspecified: Secondary | ICD-10-CM | POA: Diagnosis not present

## 2024-12-27 DIAGNOSIS — I255 Ischemic cardiomyopathy: Secondary | ICD-10-CM | POA: Diagnosis not present

## 2024-12-27 DIAGNOSIS — K219 Gastro-esophageal reflux disease without esophagitis: Secondary | ICD-10-CM | POA: Diagnosis not present

## 2024-12-27 LAB — BASIC METABOLIC PANEL WITH GFR
Anion gap: 9 (ref 5–15)
Anion gap: 16 — ABNORMAL HIGH (ref 5–15)
BUN: 19 mg/dL (ref 8–23)
BUN: 17 mg/dL (ref 8–23)
CO2: 21 mmol/L — ABNORMAL LOW (ref 22–32)
CO2: 20 mmol/L — ABNORMAL LOW (ref 22–32)
Calcium: 8.3 mg/dL — ABNORMAL LOW (ref 8.9–10.3)
Calcium: 8.1 mg/dL — ABNORMAL LOW (ref 8.9–10.3)
Chloride: 112 mmol/L — ABNORMAL HIGH (ref 98–111)
Chloride: 103 mmol/L (ref 98–111)
Creatinine, Ser: 1.03 mg/dL — ABNORMAL HIGH (ref 0.44–1.00)
Creatinine, Ser: 0.94 mg/dL (ref 0.44–1.00)
GFR, Estimated: 57 mL/min — ABNORMAL LOW
GFR, Estimated: >60 mL/min
Glucose, Bld: 168 mg/dL — ABNORMAL HIGH (ref 70–99)
Glucose, Bld: 263 mg/dL — ABNORMAL HIGH (ref 70–99)
Potassium: 4 mmol/L (ref 3.5–5.1)
Potassium: 3 mmol/L — ABNORMAL LOW (ref 3.5–5.1)
Sodium: 143 mmol/L (ref 135–145)
Sodium: 138 mmol/L (ref 135–145)

## 2024-12-27 LAB — POCT I-STAT 7, (LYTES, BLD GAS, ICA,H+H)
Acid-Base Excess: 1 mmol/L (ref 0.0–2.0)
Acid-base deficit: 2 mmol/L (ref 0.0–2.0)
Acid-base deficit: 3 mmol/L — ABNORMAL HIGH (ref 0.0–2.0)
Bicarbonate: 22.6 mmol/L (ref 20.0–28.0)
Bicarbonate: 20.1 mmol/L (ref 20.0–28.0)
Bicarbonate: 24.4 mmol/L (ref 20.0–28.0)
Calcium, Ion: 1.21 mmol/L (ref 1.15–1.40)
Calcium, Ion: 1.14 mmol/L — ABNORMAL LOW (ref 1.15–1.40)
Calcium, Ion: 1.11 mmol/L — ABNORMAL LOW (ref 1.15–1.40)
HCT: 51 % — ABNORMAL HIGH (ref 36.0–46.0)
HCT: 29 % — ABNORMAL LOW (ref 36.0–46.0)
HCT: 31 % — ABNORMAL LOW (ref 36.0–46.0)
Hemoglobin: 17.3 g/dL — ABNORMAL HIGH (ref 12.0–15.0)
Hemoglobin: 9.9 g/dL — ABNORMAL LOW (ref 12.0–15.0)
Hemoglobin: 10.5 g/dL — ABNORMAL LOW (ref 12.0–15.0)
O2 Saturation: 99 %
O2 Saturation: 87 %
O2 Saturation: 99 %
Patient temperature: 101.2
Patient temperature: 100.5
Potassium: 4 mmol/L (ref 3.5–5.1)
Potassium: 2.9 mmol/L — ABNORMAL LOW (ref 3.5–5.1)
Potassium: 3.6 mmol/L (ref 3.5–5.1)
Sodium: 144 mmol/L (ref 135–145)
Sodium: 141 mmol/L (ref 135–145)
Sodium: 140 mmol/L (ref 135–145)
TCO2: 24 mmol/L (ref 22–32)
TCO2: 21 mmol/L — ABNORMAL LOW (ref 22–32)
TCO2: 25 mmol/L (ref 22–32)
pCO2 arterial: 37.3 mmHg (ref 32–48)
pCO2 arterial: 30.1 mmHg — ABNORMAL LOW (ref 32–48)
pCO2 arterial: 33.5 mmHg (ref 32–48)
pH, Arterial: 7.391 (ref 7.35–7.45)
pH, Arterial: 7.438 (ref 7.35–7.45)
pH, Arterial: 7.475 — ABNORMAL HIGH (ref 7.35–7.45)
pO2, Arterial: 152 mmHg — ABNORMAL HIGH (ref 83–108)
pO2, Arterial: 54 mmHg — ABNORMAL LOW (ref 83–108)
pO2, Arterial: 143 mmHg — ABNORMAL HIGH (ref 83–108)

## 2024-12-27 LAB — CBC WITH DIFFERENTIAL/PLATELET
Abs Immature Granulocytes: 0.11 10*3/uL — ABNORMAL HIGH (ref 0.00–0.07)
Basophils Absolute: 0.1 10*3/uL (ref 0.0–0.1)
Basophils Relative: 0 %
Eosinophils Absolute: 0.7 10*3/uL — ABNORMAL HIGH (ref 0.0–0.5)
Eosinophils Relative: 4 %
HCT: 27.5 % — ABNORMAL LOW (ref 36.0–46.0)
Hemoglobin: 8.9 g/dL — ABNORMAL LOW (ref 12.0–15.0)
Immature Granulocytes: 1 %
Lymphocytes Relative: 13 %
Lymphs Abs: 1.9 10*3/uL (ref 0.7–4.0)
MCH: 30.1 pg (ref 26.0–34.0)
MCHC: 32.4 g/dL (ref 30.0–36.0)
MCV: 92.9 fL (ref 80.0–100.0)
Monocytes Absolute: 1.3 10*3/uL — ABNORMAL HIGH (ref 0.1–1.0)
Monocytes Relative: 8 %
Neutro Abs: 11.2 10*3/uL — ABNORMAL HIGH (ref 1.7–7.7)
Neutrophils Relative %: 74 %
Platelets: 210 10*3/uL (ref 150–400)
RBC: 2.96 MIL/uL — ABNORMAL LOW (ref 3.87–5.11)
RDW: 18.8 % — ABNORMAL HIGH (ref 11.5–15.5)
WBC: 15.2 10*3/uL — ABNORMAL HIGH (ref 4.0–10.5)
nRBC: 0.5 % — ABNORMAL HIGH (ref 0.0–0.2)

## 2024-12-27 LAB — TYPE AND SCREEN
ABO/RH(D): A POS
Antibody Screen: NEGATIVE
Unit division: 0

## 2024-12-27 LAB — PROTIME-INR
INR: 1.3 — ABNORMAL HIGH (ref 0.8–1.2)
Prothrombin Time: 16.9 s — ABNORMAL HIGH (ref 11.4–15.2)

## 2024-12-27 LAB — LACTATE DEHYDROGENASE: LDH: 539 U/L — ABNORMAL HIGH (ref 105–235)

## 2024-12-27 LAB — GLUCOSE, CAPILLARY
Glucose-Capillary: 165 mg/dL — ABNORMAL HIGH (ref 70–99)
Glucose-Capillary: 157 mg/dL — ABNORMAL HIGH (ref 70–99)
Glucose-Capillary: 170 mg/dL — ABNORMAL HIGH (ref 70–99)
Glucose-Capillary: 87 mg/dL (ref 70–99)
Glucose-Capillary: 207 mg/dL — ABNORMAL HIGH (ref 70–99)
Glucose-Capillary: 217 mg/dL — ABNORMAL HIGH (ref 70–99)

## 2024-12-27 LAB — BPAM RBC
Blood Product Expiration Date: 202602082359
ISSUE DATE / TIME: 202601261218
Unit Type and Rh: 6200

## 2024-12-27 LAB — HEPARIN LEVEL (UNFRACTIONATED): Heparin Unfractionated: <0.1 [IU]/mL — ABNORMAL LOW (ref 0.30–0.70)

## 2024-12-27 LAB — SURGICAL PATHOLOGY

## 2024-12-27 LAB — COOXEMETRY PANEL
Carboxyhemoglobin: 1.7 % — ABNORMAL HIGH (ref 0.5–1.5)
Methemoglobin: 1.3 % (ref 0.0–1.5)
O2 Saturation: 67.5 %
Total hemoglobin: 7.6 g/dL — ABNORMAL LOW (ref 12.0–16.0)

## 2024-12-27 LAB — MAGNESIUM: Magnesium: 2.3 mg/dL (ref 1.7–2.4)

## 2024-12-27 LAB — PROCALCITONIN: Procalcitonin: 1.83 ng/mL

## 2024-12-27 LAB — PHOSPHORUS: Phosphorus: 1.8 mg/dL — ABNORMAL LOW (ref 2.5–4.6)

## 2024-12-27 MED ORDER — AMIODARONE LOAD VIA INFUSION
150.0000 mg | Freq: Once | INTRAVENOUS | Status: AC
  Administered 2024-12-27: 150 mg via INTRAVENOUS
  Filled 2024-12-27: qty 83.34

## 2024-12-27 MED ORDER — AMIODARONE HCL IN DEXTROSE 360-4.14 MG/200ML-% IV SOLN
30.0000 mg/h | INTRAVENOUS | Status: DC
  Administered 2024-12-27 – 2024-12-28 (×4): 60 mg/h via INTRAVENOUS
  Administered 2024-12-28 – 2025-01-03 (×12): 30 mg/h via INTRAVENOUS
  Filled 2024-12-27: qty 400
  Filled 2024-12-27 (×14): qty 200

## 2024-12-27 MED ORDER — HYDROCORTISONE SOD SUC (PF) 100 MG IJ SOLR
100.0000 mg | Freq: Two times a day (BID) | INTRAMUSCULAR | Status: DC
  Administered 2024-12-27 – 2024-12-29 (×4): 100 mg via INTRAVENOUS
  Filled 2024-12-27 (×4): qty 2

## 2024-12-27 MED ORDER — SILDENAFIL CITRATE 20 MG PO TABS
20.0000 mg | ORAL_TABLET | Freq: Three times a day (TID) | ORAL | Status: DC
  Administered 2024-12-27 (×2): 20 mg via ORAL
  Filled 2024-12-27 (×5): qty 1

## 2024-12-27 MED ORDER — OXYCODONE HCL 5 MG PO TABS
2.5000 mg | ORAL_TABLET | ORAL | Status: DC | PRN
  Filled 2024-12-27: qty 1

## 2024-12-27 MED ORDER — FUROSEMIDE 10 MG/ML IJ SOLN
60.0000 mg | Freq: Once | INTRAMUSCULAR | Status: AC
  Administered 2024-12-27: 60 mg via INTRAVENOUS
  Filled 2024-12-27: qty 6

## 2024-12-27 MED ORDER — QUETIAPINE FUMARATE 25 MG PO TABS: 25.0000 mg | ORAL_TABLET | Freq: Two times a day (BID) | ORAL | Status: DC

## 2024-12-27 MED ORDER — VORTIOXETINE HBR 5 MG PO TABS
5.0000 mg | ORAL_TABLET | Freq: Every day | ORAL | Status: DC
  Filled 2024-12-27: qty 1

## 2024-12-27 MED ORDER — QUETIAPINE FUMARATE 25 MG PO TABS: 25.0000 mg | ORAL_TABLET | Freq: Every day | ORAL | Status: DC

## 2024-12-27 MED ORDER — EPINEPHRINE HCL 5 MG/250ML IV SOLN IN NS
0.5000 ug/min | INTRAVENOUS | Status: DC
  Administered 2024-12-27 – 2024-12-29 (×2): 0.5 ug/min via INTRAVENOUS
  Filled 2024-12-27: qty 250

## 2024-12-27 MED ORDER — DEXMEDETOMIDINE HCL IN NACL 400 MCG/100ML IV SOLN: 0.0000 ug/kg/h | INTRAVENOUS | Status: DC

## 2024-12-27 MED ORDER — POTASSIUM CHLORIDE 10 MEQ/50ML IV SOLN
10.0000 meq | INTRAVENOUS | Status: AC
  Administered 2024-12-27 (×2): 10 meq via INTRAVENOUS
  Filled 2024-12-27 (×2): qty 50

## 2024-12-27 MED ORDER — FENTANYL CITRATE (PF) 50 MCG/ML IJ SOSY
25.0000 ug | PREFILLED_SYRINGE | Freq: Once | INTRAMUSCULAR | Status: AC
  Administered 2024-12-27: 25 ug via INTRAVENOUS

## 2024-12-27 MED ORDER — METHOCARBAMOL 500 MG PO TABS
500.0000 mg | ORAL_TABLET | Freq: Three times a day (TID) | ORAL | Status: DC
  Administered 2024-12-27 – 2024-12-28 (×2): 500 mg via ORAL
  Filled 2024-12-27 (×2): qty 1

## 2024-12-27 MED ORDER — HEPARIN (PORCINE) 25000 UT/250ML-% IV SOLN
500.0000 [IU]/h | INTRAVENOUS | Status: DC
  Administered 2024-12-27 – 2024-12-28 (×2): 500 [IU]/h via INTRAVENOUS
  Filled 2024-12-27: qty 250

## 2024-12-27 MED ORDER — SILDENAFIL CITRATE 20 MG PO TABS
20.0000 mg | ORAL_TABLET | Freq: Three times a day (TID) | ORAL | Status: DC
  Administered 2024-12-27: 20 mg
  Filled 2024-12-27 (×3): qty 1

## 2024-12-27 MED ORDER — PANTOPRAZOLE SODIUM 40 MG PO TBEC
40.0000 mg | DELAYED_RELEASE_TABLET | Freq: Every day | ORAL | Status: DC
  Administered 2024-12-29: 40 mg via ORAL
  Filled 2024-12-27: qty 1

## 2024-12-27 MED ORDER — GABAPENTIN 100 MG PO CAPS
200.0000 mg | ORAL_CAPSULE | Freq: Every day | ORAL | Status: DC
  Administered 2024-12-28: 200 mg via ORAL
  Filled 2024-12-27 (×2): qty 2

## 2024-12-27 MED ORDER — POTASSIUM PHOSPHATES 15 MMOLE/5ML IV SOLN
30.0000 mmol | Freq: Once | INTRAVENOUS | Status: AC
  Administered 2024-12-27: 30 mmol via INTRAVENOUS
  Filled 2024-12-27: qty 10

## 2024-12-27 MED ORDER — WARFARIN - PHARMACIST DOSING INPATIENT: Freq: Every day | Status: AC

## 2024-12-27 MED ORDER — K PHOS MONO-SOD PHOS DI & MONO 155-852-130 MG PO TABS
500.0000 mg | ORAL_TABLET | Freq: Four times a day (QID) | ORAL | Status: DC
  Filled 2024-12-27 (×2): qty 2

## 2024-12-27 MED ORDER — VALACYCLOVIR HCL 500 MG PO TABS
500.0000 mg | ORAL_TABLET | Freq: Every day | ORAL | Status: DC
  Filled 2024-12-27: qty 1

## 2024-12-27 MED ORDER — ACETAMINOPHEN 160 MG/5ML PO SOLN: 1000.0000 mg | Freq: Four times a day (QID) | ORAL | Status: DC

## 2024-12-27 MED ORDER — SODIUM PHOSPHATES 45 MMOLE/15ML IV SOLN
30.0000 mmol | Freq: Once | INTRAVENOUS | Status: DC
  Filled 2024-12-27: qty 10

## 2024-12-27 MED ORDER — WARFARIN SODIUM 2.5 MG PO TABS
2.5000 mg | ORAL_TABLET | Freq: Once | ORAL | Status: AC
  Administered 2024-12-27: 2.5 mg via ORAL
  Filled 2024-12-27: qty 1

## 2024-12-27 MED ORDER — MIDAZOLAM HCL (PF) 2 MG/2ML IJ SOLN
2.0000 mg | INTRAMUSCULAR | Status: DC | PRN
  Administered 2024-12-27 (×2): 2 mg via INTRAVENOUS
  Filled 2024-12-27 (×3): qty 2

## 2024-12-27 MED ORDER — QUETIAPINE FUMARATE 25 MG PO TABS: 25.0000 mg | ORAL_TABLET | Freq: Once | ORAL | Status: DC

## 2024-12-27 MED ORDER — K PHOS MONO-SOD PHOS DI & MONO 155-852-130 MG PO TABS
500.0000 mg | ORAL_TABLET | Freq: Four times a day (QID) | ORAL | Status: DC
  Administered 2024-12-27 (×2): 500 mg
  Filled 2024-12-27 (×4): qty 2

## 2024-12-27 MED ORDER — ACETAMINOPHEN 10 MG/ML IV SOLN
1000.0000 mg | Freq: Four times a day (QID) | INTRAVENOUS | Status: AC
  Administered 2024-12-27 – 2024-12-28 (×4): 1000 mg via INTRAVENOUS
  Filled 2024-12-27 (×4): qty 100

## 2024-12-27 MED ORDER — DOCUSATE SODIUM 100 MG PO CAPS
100.0000 mg | ORAL_CAPSULE | Freq: Every day | ORAL | Status: DC
  Filled 2024-12-27: qty 1

## 2024-12-27 MED ORDER — POLYETHYLENE GLYCOL 3350 17 G PO PACK
17.0000 g | PACK | Freq: Every day | ORAL | Status: DC
  Filled 2024-12-27: qty 1

## 2024-12-27 MED ORDER — OXYCODONE HCL 5 MG PO TABS
2.5000 mg | ORAL_TABLET | ORAL | Status: DC | PRN
  Administered 2024-12-27 – 2024-12-29 (×6): 2.5 mg via ORAL
  Filled 2024-12-27 (×6): qty 1

## 2024-12-27 MED ORDER — FENTANYL CITRATE (PF) 50 MCG/ML IJ SOSY
PREFILLED_SYRINGE | INTRAMUSCULAR | Status: AC
  Filled 2024-12-27: qty 1

## 2024-12-27 MED ORDER — ORAL CARE MOUTH RINSE: 15.0000 mL | OROMUCOSAL | Status: DC | PRN

## 2024-12-27 MED ORDER — ACETAMINOPHEN 500 MG PO TABS
1000.0000 mg | ORAL_TABLET | Freq: Four times a day (QID) | ORAL | Status: DC
  Administered 2024-12-27: 1000 mg via ORAL
  Filled 2024-12-27: qty 2

## 2024-12-27 MED ORDER — SODIUM CHLORIDE 0.9 % IV SOLN
1.0000 g | Freq: Three times a day (TID) | INTRAVENOUS | Status: DC
  Administered 2024-12-27 – 2024-12-29 (×5): 1 g via INTRAVENOUS
  Filled 2024-12-27 (×5): qty 20

## 2024-12-27 MED ORDER — AMIODARONE HCL IN DEXTROSE 360-4.14 MG/200ML-% IV SOLN: 30.0000 mg/h | INTRAVENOUS | Status: DC

## 2024-12-27 MED ORDER — SENNA 8.6 MG PO TABS: 1.0000 | ORAL_TABLET | Freq: Two times a day (BID) | ORAL | Status: DC

## 2024-12-27 MED FILL — Thrombin (Recombinant) For Soln 20000 Unit: CUTANEOUS | Qty: 1 | Status: AC

## 2024-12-27 NOTE — Progress Notes (Signed)
 iNO weaned to 15ppm per physician order.

## 2024-12-27 NOTE — Progress Notes (Signed)
 H&V Care Navigation CSW Progress Note  Outpatient Heart Failure CSW met with patients daughter, Jon, at bedside to check in.  Daughter reports she is doing ok at this time and hopeful that patient will be able to wean off of ventilator today.  Reports no CSW needs at this time- CSW will continue to check in with patient and family during LVAD implant stay and assist as needed.  Andriette HILARIO Leech, LCSW Clinical Social Worker Advanced Heart Failure Clinic Desk#: [phone] Cell#: [phone]

## 2024-12-27 NOTE — Progress Notes (Signed)
iNO weaned to 10ppm per order

## 2024-12-27 NOTE — Progress Notes (Signed)
 Patient ID: Karen Warner, female   DOB: [birth year], 74 y.o.   MRN: [id number]     Advanced Heart Failure Rounding Note  Cardiologist: Dub Huntsman, DO  [facility] Cardiologist: Dr Rolan  Chief Complaint: Post Op HMIII LVAD  Patient Profile   Karen Warner is a 74 y.o. female with history of CAD and ischemic cardiomyopathy, EF <20%, CKD III and COPD admitted w/ a/c CHF, chest/abdominal pain. RHC c/w low output.   Significant events:   1/15: RHC (RA 2, PA 21/10, PCW 3,  PA sat 59%, TD CI 1.49, FICK CI 1.88, PAPi 5.5), started on milrinone            LHC patent stents, nonobstructive CAD   1/19: co-ox persistently in 40s, milrinone  increased 0.375 12/23/24: S/P HMIII Implant.Intra-op speed placed at 4700 due to low filling pressures.  Received 2UPRBC, 3 FFP, cellsaver, 1 albumin , and 4 FFP  12/23/24 POCUS: VAD well positioned. RV looked ok but PAPI low with high CVP 12/24/24: Extubated. Had some low flows. Cleveprex started 1/25: Re-intubated with desaturation, increased work of breathing 1/26: Ramp echo, speed increased to 5100 rpm.  1 unit PRBCs.   Subjective:    Patient is intubated this morning, CXR with left effusion.  Tm 100.4.  She is on vancomycin /Zosyn , WBCs 15.   She is on epinephrine  1, off NE, milrinone  0.375, iNO 20 ppm.  I/Os net negative 791 with Lasix  80 mg IV x 1 yesterday.   Hgb up to 8.9 with 1 unit PRBCs.  INR 1.3, not on warfarin yet.  On heparin  gtt.   Hypertensive when sedation weaned, MAP 120s.  Now sedated again, became hypotensive when epinephrine  1 stopped.   Swan: CVP 9 PA 18/9 Continuous CO/CI  2.7 Co-ox 68% LDH 539  LVAD Interrogation HM 3: Speed: 5100 Flow: 3.5 PI: 4.4 Power: 3.3.  3 low flow events early am with hypertensive episodes with sedation weaning, MAP in 120s.  Objective:   Weight Range: 78.8 kg Body mass index is 28.91 kg/m.   Vital Signs:   Temp:  [99.1 F (37.3 C)-101.3 F (38.5 C)] 99.1 F (37.3 C) (01/27  0630) Pulse Rate:  [58-212] 89 (01/27 0700) Resp:  [16-30] 25 (01/27 0700) BP: (80-102)/(51-74) 89/60 (01/27 0400) SpO2:  [96 %-100 %] 98 % (01/27 0700) Arterial Line BP: (72-145)/(48-96) 92/61 (01/27 0700) FiO2 (%):  [40 %] 40 % (01/27 0324) Weight:  [78.8 kg] 78.8 kg (01/27 0500) Last BM Date : 12/26/24  Weight change: Filed Weights   12/25/24 0500 12/26/24 0500 12/27/24 0500  Weight: 83.1 kg 83.3 kg 78.8 kg    Intake/Output:   Intake/Output Summary (Last 24 hours) at 12/27/2024 0730 Last data filed at 12/27/2024 0700 Gross per 24 hour  Intake 2948.59 ml  Output 3740 ml  Net -791.41 ml     Physical Exam   General: Intubated.  HEENT: Normal. Neck: Supple, JVP 8 cm. Carotids OK.  Cardiac:  Mechanical heart sounds with LVAD hum present.  Lungs:  CTAB, normal effort.  Abdomen:  NT, ND, no HSM. No bruits or masses. +BS  LVAD exit site: Well-healed and incorporated. Dressing dry and intact. No erythema or drainage. Stabilization device present and accurately applied. Driveline dressing changed daily per sterile technique. Extremities:  Warm and dry. No cyanosis, clubbing, rash, or edema.  Neuro: Sedated on vent.    Telemetry   NSR with v-pacing. Personally reviewed  Labs   CBC Recent Labs    12/26/24 0454  12/26/24 0455 12/27/24 0519 12/27/24 0642  WBC 21.0*  --  15.2*  --   NEUTROABS 17.1*  --  11.2*  --   HGB 7.6*   < > 8.9* 17.3*  HCT 22.7*   < > 27.5* 51.0*  MCV 94.2  --  92.9  --   PLT 184  --  210  --    < > = values in this interval not displayed.   Basic Metabolic Panel Recent Labs    98/74/73 2051 12/26/24 0454 12/26/24 0455 12/27/24 0519 12/27/24 0642  NA 138 138 139  --  144  K 3.9 3.6 3.5  --  4.0  CL 107 108  --   --   --   CO2 19* 19*  --   --   --   GLUCOSE 195* 231*  --   --   --   BUN 19 23  --   --   --   CREATININE 1.38* 1.34*  --   --   --   CALCIUM  8.1* 8.2*  --   --   --   MG 2.6* 2.7*  --  2.3  --   PHOS  --  2.1*  --  1.8*   --    Liver Function Tests Recent Labs    12/25/24 0414 12/26/24 0454  AST 123* 77*  ALT 11 8  ALKPHOS 61 68  BILITOT 1.6* 0.6  PROT 6.1* 5.6*  ALBUMIN  3.9 3.4*   No results for input(s): LIPASE, AMYLASE in the last 72 hours. Cardiac Enzymes No results for input(s): CKTOTAL, CKMB, CKMBINDEX, TROPONINI in the last 72 hours.  BNP: BNP (last 3 results) Recent Labs    09/01/24 1457  BNP 169.4*    ProBNP (last 3 results) Recent Labs    12/13/24 1529 12/14/24 0826 12/24/24 0406  PROBNP 1,342.0* 1,539.0* 3,345.0*     D-Dimer No results for input(s): DDIMER in the last 72 hours.  Hemoglobin A1C No results for input(s): HGBA1C in the last 72 hours. Fasting Lipid Panel No results for input(s): CHOL, HDL, LDLCALC, TRIG, CHOLHDL, LDLDIRECT in the last 72 hours. Medications:   Scheduled Medications:  acetaminophen   1,000 mg Oral Q6H   Or   acetaminophen  (TYLENOL ) oral liquid 160 mg/5 mL  1,000 mg Per Tube Q6H   arformoterol   15 mcg Nebulization BID   Chlorhexidine  Gluconate Cloth  6 each Topical Daily   docusate  100 mg Per Tube BID   feeding supplement (PIVOT 1.5 CAL)  1,000 mL Per Tube Q24H   gabapentin   200 mg Per Tube QHS   insulin  aspart  0-24 Units Subcutaneous Q4H   insulin  aspart  3 Units Subcutaneous Q4H   insulin  glargine  15 Units Subcutaneous Daily   lidocaine   1 patch Transdermal Q24H   methocarbamol   500 mg Per Tube Q8H   metoCLOPramide  (REGLAN ) injection  10 mg Intravenous Q6H   mouth rinse  15 mL Mouth Rinse Q2H   pantoprazole  (PROTONIX ) IV  40 mg Intravenous Q24H   phosphorus  500 mg Per Tube QID   polyethylene glycol  17 g Per Tube Daily   revefenacin   175 mcg Nebulization Daily   senna  1 tablet Per Tube BID   sodium chloride  flush  3 mL Intravenous Q12H   valACYclovir   500 mg Per Tube Daily   Vitamin D  (Ergocalciferol )  50,000 Units Oral Q Sun   vortioxetine  HBr  5 mg Per Tube QHS    Infusions:  clevidipine   Stopped (12/25/24 1553)   dexmedetomidine  (PRECEDEX ) IV infusion 1.1 mcg/kg/hr (12/27/24 0700)   epinephrine  1 mcg/min (12/27/24 0700)   fentaNYL  infusion INTRAVENOUS 150 mcg/hr (12/27/24 0700)   heparin  500 Units/hr (12/27/24 0700)   milrinone  0.375 mcg/kg/min (12/27/24 0700)   norepinephrine  (LEVOPHED ) Adult infusion Stopped (12/27/24 0308)   piperacillin -tazobactam (ZOSYN )  IV 12.5 mL/hr at 12/27/24 0700   vancomycin  Stopped (12/26/24 1604)    PRN Medications: dextrose , fentaNYL , fentaNYL  (SUBLIMAZE ) injection, HYDROmorphone , midazolam  PF, ondansetron  (ZOFRAN ) IV, mouth rinse, mouth rinse, sodium chloride  flush, Ubrogepant      Assessment/Plan   1. 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  - Intra Op Speed 4600 due to low filling pressures.  - Ramp echo 1/26: RV small, moderate dysfunction; interventricular septum bows to the right; the aortic valve does not open. Speed increased from 4800 step-wise to 5100.  Flow increased 3.0 L/min => 3.7 L/min, no ectopy.  LVIDD decreased appropriately.  At 5100 rpm, the IV septum still had some rightward shift but less than initially and the RV appeared to fill more.  - Currently on milrinone  0.375 + epinephrine  1 + iNO. Decrease epinephrine  to 0.5, MAP too low when sedated if dropped completely.  - MAP high with low flows when sedation weaned.  Will likely need to restart clevidipine  gtt with sedation wean.  - Wean iNO and start sildenafil  20 tid.  - Co-ox 68% with CI 2.7 by continuous cardiac output Swan but suspect RV failure with low PAPi.  RV moderately dysfunctional but not enlarged by echo yesterday.   - CVP 8-9, will give Lasix  60 mg IV x 1 and follow.  - INR 1.3, on heparin  gtt and will start warfarin after extubation.   2.  Acute on chronic systolic CHF: Ischemic cardiomyopathy.  [employer] CRT-D device  - She is end-stage NYHA IV.  - s/p HM-3 VAD - management as above  3. Acute  hypoxic respiratory failure - reintubated 1/25 - Suspect PNA, fever to 100.4 last night with WBCs 15.  Now on vancomycin /Zosyn .  - Discussed with Dr. Gretta, will try to extubate today.   4. CAD: PCI to mid/distal LAD in 2020 and proximal LAD in 2021.   Cath this admit showed stable coronary anatomy. Suspect CP related to HF as above - Off ranolazine , imdur , plavix  - Restart Crestor  + Zetia  eventually.   5. COPD: History of smoking, quit 2021.    6. CKD 3: Follows with nephology at [facility].  - Creatinine 1.15 => 1.34 => pending.   7. Obesity: Has been on semaglutide. Now on hold.   8. PAD: Moderately decreased ABI on right in 7/25. Medical mgmt per Dr. Serene. Again ABI on 1/19, the R had moderate disease, and mild on the L. No claudication or other PAD symptoms.   9. Anemia: Post-op, 1 unit PRBCs 1/25.  Hgb 8.9 today.   - Transfuse Hgb < 8.   10. FEN: TFs ongoing.    CRITICAL CARE Performed by: Karen Warner  Total critical care time: 45 minutes  Critical care time was exclusive of separately billable procedures and treating other patients.  Critical care was necessary to treat or prevent imminent or life-threatening deterioration.  Critical care was time spent personally by me on the following activities: development of treatment plan with patient and/or surrogate as well as nursing, discussions with consultants, evaluation of patient's response to treatment, examination of patient, obtaining history from patient  or surrogate, ordering and performing treatments and interventions, ordering and review of laboratory studies, ordering and review of radiographic studies, pulse oximetry and re-evaluation of patient's condition.   Length of Stay: 26  Karen Shuck, MD  12/27/2024, 7:30 AM  Advanced Heart Failure Team Pager [phone] (M-F; 7a - 5p)   Please visit [email or web address]: For overnight coverage please call cardiology fellow first. If fellow not available call Shock/ECMO MD on  call.  For ECMO / Mechanical Support (Impella, IABP, LVAD) issues call Shock / ECMO MD on call.

## 2024-12-27 NOTE — Progress Notes (Signed)
 Patient ID: Karen Warner, female   DOB: 07-20-1951, 74 y.o.   MRN: [id number]  Called to bedside to see patient for tachycardia.  She is in a wide complex tachycardia.  On further evaluation, this appears to be sinus tachycardia with clear atrial activity.  HR gradually increased from 120 bpm with BiV pacing to sinus tachy in 130s then 140s with loss of BiV pacing and her baseline LBBB. She is currently febrile to 101.4.  MAP stable in 70s-80s on epinephrine  0.5, milrinone  0.375, clevidipine  1.  She is awake/alert and eating an Italian ice. This appears to be a SIRS response, ?infection vs noninfectious.  - Cooling blanket, scheduled Tylenol  - Change antibiotics to vancomycin /meropenem  - CVP 11, will give another dose of Lasix  60 mg IV x 1.  - Sinus tachy in 140s, I am going to add amiodarone  gtt as I worry that if HR increases further she could degenerate to VT.   CRITICAL CARE Performed by: Ezra Shuck  Total critical care time: 40 minutes  Critical care time was exclusive of separately billable procedures and treating other patients.  Critical care was necessary to treat or prevent imminent or life-threatening deterioration.  Critical care was time spent personally by me on the following activities: development of treatment plan with patient and/or surrogate as well as nursing, discussions with consultants, evaluation of patient's response to treatment, examination of patient, obtaining history from patient or surrogate, ordering and performing treatments and interventions, ordering and review of laboratory studies, ordering and review of radiographic studies, pulse oximetry and re-evaluation of patient's condition.  Ezra Shuck 12/27/2024 5:27 PM

## 2024-12-27 NOTE — Progress Notes (Signed)
 Pt restarted on iNO 20ppm per physician order. Pt placed on bipap per CCM request with iNO running inline.

## 2024-12-27 NOTE — Evaluation (Signed)
 Physical Therapy Re-Evaluation and Treatment Patient Details Name: Karen Warner MRN: [id number] DOB: [birth year] Today's Date: 12/27/2024  History of Present Illness  74 yo F presented to hospital with anterior chest pain. She states this pain is similar to her previous CAD episodes. Underwent heart cath 12/15/2024 and now advanced heart failure and LVAD coordinators consulted. PHMx:CAD, COPD (quit smoking 2021). Ischemic CM with EF < 20%, ICD placed. CHF, chronic low back pain, CKD3, DM2, DDD-cervical, fibromyalgia, MI, peripheral neuropathy, PVD, retinopathy and glaucoma  Clinical Impression  Pt extubated earlier today and reports feeling better. RN requests bed level exercise as her LVAD pressure drops when her BP is elevated or depressed and it dropped with rolling in the bed for bathing. Pt happy to work with therapist on LE exercise. When finished pt reports need to have BM. PT provided total A to roll for bed pan placement. Patient will benefit from intensive inpatient follow-up therapy, >3 hours/day. PT will continue to work with pt acutely to progress mobility.          If plan is discharge home, recommend the following: A little help with bathing/dressing/bathroom;Assistance with cooking/housework;Assist for transportation;Help with stairs or ramp for entrance   Can travel by private vehicle        Equipment Recommendations None recommended by PT  Recommendations for Other Services  Rehab consult    Functional Status Assessment Patient has had a recent decline in their functional status and demonstrates the ability to make significant improvements in function in a reasonable and predictable amount of time.     Precautions / Restrictions Precautions Precautions: Fall;Sternal Recall of Precautions/Restrictions: Intact Precaution/Restrictions Comments: LVAD, Swan, foley Restrictions Weight Bearing Restrictions Per Provider Order: Yes Other Position/Activity  Restrictions: sternal precautions      Mobility  Bed Mobility Overal bed mobility: Needs Assistance Bed Mobility: Rolling Rolling: Total assist         General bed mobility comments: total A for rolling to L for placement of bed pan             Pertinent Vitals/Pain Pain Assessment Pain Assessment: Faces Faces Pain Scale: Hurts a little bit Pain Location: generalized Pain Descriptors / Indicators: Grimacing, Guarding Pain Intervention(s): Limited activity within patient's tolerance, Monitored during session, Repositioned    Home Living Family/patient expects to be discharged to:: Private residence Living Arrangements: Children;Other relatives Available Help at Discharge: Family;Available PRN/intermittently Type of Home: House Home Access: Stairs to enter Entrance Stairs-Rails: Left;Right;Can reach both Entrance Stairs-Number of Steps: 6   Home Layout: One level Home Equipment: Agricultural Consultant (2 wheels);Rollator (4 wheels);Shower seat      Prior Function Prior Level of Function : Independent/Modified Independent             Mobility Comments: indep without AD ADLs Comments: indep     Extremity/Trunk Assessment   Upper Extremity Assessment Upper Extremity Assessment: Defer to OT evaluation    Lower Extremity Assessment Lower Extremity Assessment: Generalized weakness    Cervical / Trunk Assessment Cervical / Trunk Assessment: Normal  Communication   Communication Communication: No apparent difficulties    Cognition Arousal: Alert Behavior During Therapy: WFL for tasks assessed/performed   PT - Cognitive impairments: No apparent impairments                         Following commands: Intact       Cueing Cueing Techniques: Verbal cues, Gestural cues     General Comments  General comments (skin integrity, edema, etc.): RN requests bed level exercise due to low pressure when her BP is high or low, HR in 120s with exercise. on 3L O2  via [place], SpO2 >90%O2,    Exercises General Exercises - Lower Extremity Ankle Circles/Pumps: AROM, 10 reps, Supine Quad Sets: AROM, 10 reps, Supine Gluteal Sets: AROM, 10 reps, Supine Heel Slides: AROM, 10 reps, Supine   Assessment/Plan    PT Assessment Patient needs continued PT services  PT Problem List Decreased activity tolerance;Decreased mobility;Cardiopulmonary status limiting activity       PT Treatment Interventions DME instruction;Gait training;Stair training;Functional mobility training;Therapeutic activities;Therapeutic exercise;Balance training;Patient/family education;Manual techniques;Modalities    PT Goals (Current goals can be found in the Care Plan section)  Acute Rehab PT Goals Patient Stated Goal: to feel better PT Goal Formulation: With patient Time For Goal Achievement: 12/31/24 Potential to Achieve Goals: Good    Frequency Min 2X/week        AM-PAC PT 6 Clicks Mobility  Outcome Measure Help needed turning from your back to your side while in a flat bed without using bedrails?: A Little Help needed moving from lying on your back to sitting on the side of a flat bed without using bedrails?: A Lot Help needed moving to and from a bed to a chair (including a wheelchair)?: A Lot Help needed standing up from a chair using your arms (e.g., wheelchair or bedside chair)?: A Lot Help needed to walk in hospital room?: A Lot Help needed climbing 3-5 steps with a railing? : Total 6 Click Score: 12    End of Session   Activity Tolerance: Patient tolerated treatment well Patient left: in bed;with call bell/phone within reach;with bed alarm set;with family/visitor present Nurse Communication: Mobility status PT Visit Diagnosis: Other abnormalities of gait and mobility (R26.89);Muscle weakness (generalized) (M62.81)    Time: 8484-8469 PT Time Calculation (min) (ACUTE ONLY): 15 min   Charges:   PT Evaluation $PT Re-evaluation: 1 Re-eval   PT General  Charges $$ ACUTE PT VISIT: 1 Visit         Gavin Telford B. Fleeta Lapidus PT, DPT Acute Rehabilitation Services Please use secure chat or  Call Office [phone]   Karen Warner Fleeta [facility] 12/27/2024, 4:04 PM

## 2024-12-27 NOTE — Progress Notes (Signed)
 PHARMACY - ANTICOAGULATION CONSULT NOTE  Pharmacy Consult for heparin  bridge to warfarin  Indication: LVAD  Allergies[1]  Patient Measurements: Height: 5' 5 (165.1 cm) Weight: 78.8 kg (173 lb 11.6 oz) IBW/kg (Calculated) : 57 HEPARIN  DW (KG): 72  Vital Signs: Temp: 99.9 F (37.7 C) (01/27 1330) Temp Source: Bladder (01/27 1200) BP: 88/54 (01/27 0815) Pulse Rate: 130 (01/27 1520)  Labs: Recent Labs    12/25/24 0414 12/25/24 0515 12/25/24 2051 12/26/24 0454 12/26/24 0455 12/26/24 1800 12/27/24 0519 12/27/24 0642 12/27/24 0734  HGB 8.5*   < >  --  7.6* 7.8*  --  8.9* 17.3*  --   HCT 25.7*   < >  --  22.7* 23.0*  --  27.5* 51.0*  --   PLT 182  --   --  184  --   --  210  --   --   LABPROT 19.3*  --   --  20.2*  --   --  16.9*  --   --   INR 1.5*  --   --  1.6*  --   --  1.3*  --   --   HEPARINUNFRC  --   --   --   --   --  <0.10* <0.10*  --   --   CREATININE 1.15*  --  1.38* 1.34*  --   --   --   --  1.03*   < > = values in this interval not displayed.    Estimated Creatinine Clearance: 49.7 mL/min (A) (by C-G formula based on SCr of 1.03 mg/dL (H)).   Medical History: Past Medical History:  Diagnosis Date   AICD (automatic cardioverter/defibrillator) present    Anemia 12/12/2019   Anginal pain    Arthralgia 01/14/2020   Arthritis    Asthma    Atherosclerotic heart disease of native coronary artery without angina pectoris 04/13/2014   CHF (congestive heart failure) (HCC)    Chronic bilateral low back pain with bilateral sciatica 01/13/2019   Chronic bladder pain 11/08/2014   Last Assessment & Plan:  Formatting of this note might be different from the original. Patient has chronic pain syndrome in general I think this is unfortunately worsening her pelvic pain and bladder pain her examination today was very reassuring I am advised her to use the estrogen cream I did start her on Elavil 10 milligrams at that time if she does tolerated will increase it to 25  milligrams.    Chronic headaches    Chronic idiopathic constipation 12/22/2014   Formatting of this note might be different from the original. Last Assessment & Plan:  For better bowel emptying please use either Citrucel / Benefiber start with 2 tablespoons daily and titrate up or down to effect.  Also please use glycerin  suppositories as needed to assist in evacuation. Last Assessment & Plan:  Formatting of this note might be different from the original. For better bowel empt   Chronic obstructive pulmonary disease, unspecified (HCC) 03/18/2018   CKD (chronic kidney disease) stage 3, GFR 30-59 ml/min (HCC)    Class 1 obesity due to excess calories with serious comorbidity and body mass index (BMI) of 31.0 to 31.9 in adult 06/26/2020   Colon polyps    COPD (chronic obstructive pulmonary disease) (HCC)    Coronary artery disease    DDD (degenerative disc disease), cervical 01/13/2019   Depression 11/05/2019   Diabetic polyneuropathy associated with type 2 diabetes mellitus (HCC) 02/24/2019   Diverticulitis 05/20/2018  Diverticulitis of colon 03/18/2018   Family history of ischemic heart disease (IHD) 08/16/2013   Fibromyalgia    Fibromyalgia affecting multiple sites 01/14/2020   Gastro-esophageal reflux disease without esophagitis 01/13/2019   Glaucoma 11/08/2014   Hordeolum externum of left upper eyelid 03/13/2020   Hypertension 11/08/2014   IBS (irritable bowel syndrome)    Iron deficiency anemia 01/13/2019   Left renal mass 11/08/2019   Leukocytosis 05/28/2018   Low vitamin B12 level 03/13/2020   Low vitamin D  level 12/12/2019   LV dysfunction 05/31/2019   Malignant essential hypertension 01/22/2016   Migraine, unspecified, not intractable, without status migrainosus 11/08/2014   Mixed hyperlipidemia 01/13/2019   Myocardial infarction (HCC)    Other premature beats 11/08/2014   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 02/22/2019   PVD (peripheral vascular disease)  04/08/2017   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 Last Assessment & Plan:  For vaginal atrophy please place a pea size dab of Estrogen vaginal cream  into the vagina 3 times a week ( Monday, Wednesday, Friday) Last Assessment & Plan:  Formatting of this note might be different from the original. For vaginal atrophy please place a pea size dab of Estrogen vaginal cream  into the vagina     Medications:  Scheduled:   acetaminophen   1,000 mg Oral Q6H   arformoterol   15 mcg Nebulization BID   Chlorhexidine  Gluconate Cloth  6 each Topical Daily   [START ON 12/28/2024] docusate sodium   100 mg Oral Daily   gabapentin   200 mg Oral QHS   insulin  aspart  0-24 Units Subcutaneous Q4H   insulin  glargine  15 Units Subcutaneous Daily   lidocaine   1 patch Transdermal Q24H   methocarbamol   500 mg Oral Q8H   metoCLOPramide  (REGLAN ) injection  10 mg Intravenous Q6H   [START ON 12/28/2024] pantoprazole   40 mg Oral Daily   phosphorus  500 mg Oral QID   [START ON 12/28/2024] polyethylene glycol  17 g Oral Daily   revefenacin   175 mcg Nebulization Daily   sildenafil   20 mg Oral TID   sodium chloride  flush  3 mL Intravenous Q12H   [START ON 12/28/2024] valACYclovir   500 mg Oral Daily   Vitamin D  (Ergocalciferol )  50,000 Units Oral Q Sun   vortioxetine  HBr  5 mg Oral QHS   warfarin  2.5 mg Oral ONCE-1600   Warfarin - Pharmacist Dosing Inpatient   Does not apply q1600    Assessment: Patient is a 74 year old female with new LVAD placement 1/23. She has been receiving heparin  at 500 units/hr fixed per AHF. She was not on anticoagulation PTA. Pharmacy is consulted to initiate warfarin for LVAD.   Today is post-op day 4. She has a pocket drain  that is putting out minimal output. Hgb 8.9, PLT stable at 210. No signs of overt bleeding. Baseline INR today 1.3. T bili (1/25) 0.6.   Interactions: Patient got extubated today and on clear liquid diet. No significant medication interactions.   Goal of Therapy:  INR 2-2.5   Heparin  level <0.3  Monitor platelets by anticoagulation protocol: Yes   Plan:  Give Warfarin 2.5mg  tonight.  Continue heparin  until INR 1.8 per protocol.  Monitor daily  INR, CBC, and HL.    Karen Warner M Karen Warner 12/27/2024,3:48 PM      [1]  Allergies Allergen Reactions   Bee Venom Anaphylaxis   Sumatriptan Shortness Of Breath    Migraine worsened   Amoxicillin-Pot Clavulanate Diarrhea and Nausea And Vomiting    GI Intolerance   Oxycodone  Itching and Hives    Other reaction(s): Other (see comments) Funny feeling in head  off the edge    Buprenorphine Hcl     Other reaction(s): Itching   Clarithromycin Diarrhea    Abdominal pain   Duloxetine  Hcl Other (See Comments)    drowsiness   Hydrocodone Itching   Lactose Intolerance (Gi) Other (See Comments)    Upset stomach, gas/bloating   Liraglutide Other (See Comments)    Abdominal discomfort   Tramadol Hcl Itching

## 2024-12-27 NOTE — Progress Notes (Signed)
 HeartMate 3 Rounding Note  Subjective:    Fairly stable night. Woke up this am and MAP went up into the 120's with some low flow alarms. Arterial waveform remains very pulsatile.  Milrinone  0.375, epi 0-1. CI 2.3, filling pressures low. Co-ox 67.5  Echo and ramp performed yesterday and speed increased to 5100. I think RV looks ok. CVP remains low.  -953 cc yesterday.  Multiple bowel movements overnight.  LVAD INTERROGATION:  HeartMate IIl LVAD:  Flow 3.5 liters/min, speed 5100, power 3, PI 3.7.    Objective:    Vital Signs:   Temp:  [99.1 F (37.3 C)-101.3 F (38.5 C)] 99.1 F (37.3 C) (01/27 0630) Pulse Rate:  [58-212] 106 (01/27 0645) Resp:  [16-30] 25 (01/27 0645) BP: (80-102)/(51-74) 89/60 (01/27 0400) SpO2:  [96 %-100 %] 99 % (01/27 0645) Arterial Line BP: (72-145)/(48-96) 119/81 (01/27 0645) FiO2 (%):  [40 %] 40 % (01/27 0324) Weight:  [78.8 kg] 78.8 kg (01/27 0500) Last BM Date : 12/26/24 Mean arterial Pressure 70-80's most of the time.  Intake/Output:   Intake/Output Summary (Last 24 hours) at 12/27/2024 0651 Last data filed at 12/27/2024 0600 Gross per 24 hour  Intake 2911.56 ml  Output 3740 ml  Net -828.44 ml     Physical Exam: General: sedated on vent Cor: Distant heart sounds with LVAD hum present. Lungs: clear Abdomen: soft, nontender, nondistended. Good bowel sounds. Extremities: minimal edema Neuro: sedated but eyes open and moving all extremities. Telemetry: internally paced  Labs: Basic Metabolic Panel: Recent Labs  Lab 12/24/24 0406 12/24/24 0523 12/24/24 1700 12/24/24 1822 12/25/24 0414 12/25/24 0515 12/25/24 2051 12/26/24 0454 12/26/24 0455 12/27/24 0519 12/27/24 0642  NA 140   < > 136   < > 136 138 138 138 139  --  144  K 4.9   < > 4.0   < > 4.3 4.0 3.9 3.6 3.5  --  4.0  CL 108  --  107  --  106  --  107 108  --   --   --   CO2 19*  --  18*  --  19*  --  19* 19*  --   --   --   GLUCOSE 180*  --  156*  --  181*  --  195* 231*   --   --   --   BUN 12  --  10  --  11  --  19 23  --   --   --   CREATININE 1.52*  --  1.30*  --  1.15*  --  1.38* 1.34*  --   --   --   CALCIUM  8.5*  --  8.5*  --  8.4*  --  8.1* 8.2*  --   --   --   MG 2.8*  --  2.5*  --  2.4  --  2.6* 2.7*  --  2.3  --   PHOS 2.9  --   --   --  2.0*  --   --  2.1*  --  1.8*  --    < > = values in this interval not displayed.    Liver Function Tests: Recent Labs  Lab 12/23/24 0223 12/24/24 0406 12/25/24 0414 12/26/24 0454  AST 19 117* 123* 77*  ALT 8 12 11 8   ALKPHOS 62 52 61 68  BILITOT 0.3 0.6 1.6* 0.6  PROT 7.3 5.9* 6.1* 5.6*  ALBUMIN  4.0 3.8 3.9 3.4*   No results for  input(s): LIPASE, AMYLASE in the last 168 hours. No results for input(s): AMMONIA in the last 168 hours.  CBC: Recent Labs  Lab 12/24/24 0406 12/24/24 0523 12/24/24 1700 12/24/24 1822 12/25/24 0414 12/25/24 0515 12/26/24 0454 12/26/24 0455 12/27/24 0519 12/27/24 0642  WBC 17.6*  --  18.5*  --  23.6*  --  21.0*  --  15.2*  --   NEUTROABS 13.6*  --   --   --  19.9*  --  17.1*  --  11.2*  --   HGB 9.1*   < > 9.4*   < > 8.5* 8.8* 7.6* 7.8* 8.9* 17.3*  HCT 28.3*   < > 28.3*   < > 25.7* 26.0* 22.7* 23.0* 27.5* 51.0*  MCV 94.3  --  95.6  --  96.3  --  94.2  --  92.9  --   PLT 202  --  198  --  182  --  184  --  210  --    < > = values in this interval not displayed.    INR: Recent Labs  Lab 12/23/24 2000 12/24/24 0406 12/25/24 0414 12/26/24 0454 12/27/24 0519  INR 1.3* 1.3* 1.5* 1.6* 1.3*    Other results: EKG:   Imaging: DG CHEST PORT 1 VIEW Result Date: 12/27/2024 EXAM: 1 VIEW(S) XRAY OF THE CHEST 12/27/2024 01:57:00 AM COMPARISON: 12/26/2024 CLINICAL HISTORY: Hypoxia. FINDINGS: LINES, TUBES AND DEVICES: Left chest cardiac device, LVAD, right IJ PA catheter, enteric tube, and endotracheal tube in place. Right neck stimulator device noted. LUNGS AND PLEURA: Low lung volumes with layering left pleural effusion and associated atelectasis. No  pneumothorax. HEART AND MEDIASTINUM: Cardiomegaly, unchanged. BONES AND SOFT TISSUES: No acute osseous abnormality. IMPRESSION: 1. Low lung volumes with layering left pleural effusion and associated atelectasis. Electronically signed by: Oneil Devonshire MD 12/27/2024 02:03 AM EST RP Workstation: [id number]   ECHOCARDIOGRAM LIMITED Result Date: 12/26/2024    ECHOCARDIOGRAM LIMITED REPORT   Patient Name:   Karen Warner Date of Exam: 12/26/2024 Medical Rec #:  [id number]                 Height:       65.0 in Accession #:    [id number]                Weight:       183.6 lb Date of Birth:  02-Sep-1951                 BSA:          1.908 m Patient Age:    73 years                  BP:           0/0 mmHg Patient Gender: F                         HR:           104 bpm. Exam Location:  Inpatient Procedure: Limited Echo (Both Spectral and Color Flow Doppler were utilized            during procedure). Indications:    LVAD evaluation Z95.811  History:        Patient has prior history of Echocardiogram examinations, most                 recent 11/18/2024.  Sonographer:    Sydnee Wilson RDCS Referring Phys: [phone] EZRA RAMAN  MCLEAN IMPRESSIONS  1. Left ventricular ejection fraction, by estimation, is <20%. The left ventricle has severely decreased function. The left ventricle demonstrates global hypokinesis. There is mild concentric left ventricular hypertrophy. LVAD inflow cannula noted at apex.  2. Right ventricular systolic function is moderately reduced. The right ventricular size is normal.  3. The mitral valve is normal in structure. No evidence of mitral valve regurgitation. No evidence of mitral stenosis.  4. The aortic valve does not open. No aortic insufficiency.  5. A small pericardial effusion is present. The pericardial effusion is circumferential.  6. Ramp echo. Speed at 4800 rpm initially with septum bowing to the right. Speed increased in step-wise fashion to 5100 rpm. The septum bowel slightly to the right  at this speed still, but was closer to midline. Speed left at 5100 rpm. FINDINGS  Left Ventricle: Left ventricular ejection fraction, by estimation, is <20%. The left ventricle has severely decreased function. The left ventricle demonstrates global hypokinesis. The left ventricular internal cavity size was normal in size. There is mild concentric left ventricular hypertrophy. Right Ventricle: The right ventricular size is normal. Right ventricular systolic function is moderately reduced. Left Atrium: Left atrial size was normal in size. Right Atrium: Right atrial size was normal in size. Pericardium: A small pericardial effusion is present. The pericardial effusion is circumferential. Mitral Valve: The mitral valve is normal in structure. No evidence of mitral valve stenosis. Tricuspid Valve: The tricuspid valve is normal in structure. Tricuspid valve regurgitation is not demonstrated. Aortic Valve: The aortic valve does not open. No aortic insufficiency. Additional Comments: A device lead is visualized in the right ventricle.  LEFT VENTRICLE PLAX 2D LVIDd:         4.67 cm LV IVS:        0.90 cm LVOT diam:     2.00 cm LVOT Area:     3.14 cm  RIGHT VENTRICLE RV S prime:     11.90 cm/s  SHUNTS Systemic Diam: 2.00 cm Dalton McleanMD Electronically signed by Ezra Kanner Signature Date/Time: 12/26/2024/2:10:42 PM    Final    DG CHEST PORT 1 VIEW Result Date: 12/26/2024 EXAM: 1 VIEW(S) XRAY OF THE CHEST 12/26/2024 05:44:31 AM COMPARISON: 12/25/2024 CLINICAL HISTORY: Left ventricular assist device present. HCC. FINDINGS: LINES, TUBES AND DEVICES: Right internal jugular Swan-Ganz catheter with tip over the main pulmonary artery, directed towards the right pulmonary artery. Endotracheal tube 1.5 cm above carina. Enteric tube courses below diaphragm with tip out of field of view. Right chest neural stimulator in place. Left chest ICD in place. LVAD in place. LUNGS AND PLEURA: Left basilar atelectasis. Possible trace  bilateral pleural effusions. No pneumothorax. HEART AND MEDIASTINUM: Cardiomegaly. BONES AND SOFT TISSUES: Status post median sternotomy. No acute osseous abnormality. IMPRESSION: 1. Left ventricular assist device in place. 2. Left basilar atelectasis and possible trace bilateral pleural effusions. Electronically signed by: Waddell Calk MD 12/26/2024 06:11 AM EST RP Workstation: [id number]   DG Abd Portable 1V Result Date: 12/25/2024 CLINICAL DATA:  NG tube placement. EXAM: PORTABLE ABDOMEN - 1 VIEW COMPARISON:  08/24/2023, 12/25/2024. FINDINGS: The bowel gas pattern is normal. An enteric tube terminates in the stomach and appears appropriate in position. There stable cardiomegaly with LVAD in place. An AICD device is noted over the left chest. Swan-Ganz catheter appear stable. An endotracheal tube terminates 2.5 cm above the carina. A stable neurostimulator device is noted on the right. Sternotomy wires are present over the midline. IMPRESSION: 1. Enteric tube terminates in the  stomach. 2. Remaining support apparatus appear stable. Electronically Signed   By: Leita Birmingham M.D.   On: 12/25/2024 16:48   DG CHEST PORT 1 VIEW Result Date: 12/25/2024 CLINICAL DATA:  Intubation. EXAM: PORTABLE CHEST 1 VIEW COMPARISON:  12/25/2024. FINDINGS: The heart is enlarged and the mediastinal contour stable. There is atherosclerotic calcification of the aorta. An LVAD device is noted. There is evidence of prior cardiothoracic surgery. A right Swan-Ganz catheter curls over the anticipated region of the pulmonary outflow tract. An AICD is in place on the left. A neurostimulator device with lead coursing over the cervical soft tissues. An endotracheal tube terminates 1.8 cm above the carina. Mild airspace disease is noted at the lung bases. No obvious effusion or pneumothorax is seen. Sternotomy wires are present over the midline. IMPRESSION: 1. Mild atelectasis at the lung bases. 2. Support apparatus as described above.  Electronically Signed   By: Leita Birmingham M.D.   On: 12/25/2024 13:03     Medications:     Scheduled Medications:  acetaminophen   1,000 mg Oral Q6H   Or   acetaminophen  (TYLENOL ) oral liquid 160 mg/5 mL  1,000 mg Per Tube Q6H   arformoterol   15 mcg Nebulization BID   Chlorhexidine  Gluconate Cloth  6 each Topical Daily   docusate  100 mg Per Tube BID   feeding supplement (PIVOT 1.5 CAL)  1,000 mL Per Tube Q24H   gabapentin   200 mg Per Tube QHS   insulin  aspart  0-24 Units Subcutaneous Q4H   insulin  aspart  3 Units Subcutaneous Q4H   insulin  glargine  15 Units Subcutaneous Daily   lidocaine   1 patch Transdermal Q24H   methocarbamol   500 mg Per Tube Q8H   metoCLOPramide  (REGLAN ) injection  10 mg Intravenous Q6H   mouth rinse  15 mL Mouth Rinse Q2H   pantoprazole  (PROTONIX ) IV  40 mg Intravenous Q24H   polyethylene glycol  17 g Per Tube Daily   revefenacin   175 mcg Nebulization Daily   senna  1 tablet Per Tube BID   sodium chloride  flush  3 mL Intravenous Q12H   valACYclovir   500 mg Per Tube Daily   Vitamin D  (Ergocalciferol )  50,000 Units Oral Q Sun   vortioxetine  HBr  5 mg Per Tube QHS    Infusions:  clevidipine  Stopped (12/25/24 1553)   dexmedetomidine  (PRECEDEX ) IV infusion 1.2 mcg/kg/hr (12/27/24 0600)   epinephrine  1 mcg/min (12/27/24 0600)   fentaNYL  infusion INTRAVENOUS 150 mcg/hr (12/27/24 0600)   heparin  500 Units/hr (12/27/24 0600)   milrinone  0.375 mcg/kg/min (12/27/24 0600)   norepinephrine  (LEVOPHED ) Adult infusion Stopped (12/27/24 0308)   piperacillin -tazobactam (ZOSYN )  IV 3.375 g (12/27/24 9371)   vancomycin  Stopped (12/26/24 1604)    PRN Medications: dextrose , fentaNYL , fentaNYL  (SUBLIMAZE ) injection, HYDROmorphone , midazolam  PF, ondansetron  (ZOFRAN ) IV, mouth rinse, mouth rinse, sodium chloride  flush, Ubrogepant    Assessment:   POD 4 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 Normal RV. Preop LVEF 20-25%. She has CRT-D   device and Barostim.   CAD with stable coronary anatomy per recent cath.   COPD with previous smoking until 2021.   Stage 3 CKD with creat around 1.5-1.6 preop.   Preop RUQ and epigastric abdominal pain with no evidence of acute cholecystitis on US  . Seen by GI. Felt to possibly be related to low CO.   Obesity.    Acute postop respiratory failure requiring reintubation yesterday. Appeared to be developing sepsis with fever, leukocytosis, increased  lactic acid. Started on vanc and Zosyn  with quick improvement in lactic acid. This could just be SIRS early postop. All lines and foley new from OR.   Acute postop blood loss anemia improved with transfusion yesterday.  Plan/Discussion:    Hypertensive when she wakes up on the vent with low flow alarms but very pulsatile arterial tracing. I think we are just going to have to ignore alarms and wean to extubate. She tolerates it well.  Vent wean per CCM.  Keep pocket drain in.  Heparin  anticoagulation until able to take Coumadin .  I reviewed the LVAD parameters from today, and compared the results to the patient's prior recorded data.  No programming changes were made.  The LVAD is functioning within specified parameters.   Length of Stay: [address]  [name] 12/27/2024, 6:51 AM

## 2024-12-27 NOTE — Progress Notes (Addendum)
 "  NAME:  Karen Warner, MRN:  [id number], DOB:  1950/12/10, LOS: 12 ADMISSION DATE:  12/14/2024, CONSULTATION DATE:  1/23 REFERRING MD:  Lucas, CHIEF COMPLAINT:  post-LVAD   History of Present Illness:  [name] is a 74 y/o woman with a history of ICM and HFrEF who presented on 1/14 with chest pain and decompensated heart failure. She underwent left and right heart catheterization this admission showing low CO and low filling pressures.  She was managed with inotropic support. Due to progression of disease and poor tolerance of GDMT, she was evaluated for LVAD.  Intraoperatively her course was uncomplicated other than low flows on LVAD when her BP went up at the end of the case when she was stimulated. She transferred to the ICU on epi 2, NE 1, milrinone  0.348mcg. TEE with normal RV function, evidence of intrapulmonary shunt.  Paralytics not reversed in ICU. Bypass time , crossclamp time 29 min, cell saver 225cc. LVAD speed 4600 RPM. Alarms on LVAD went off during periods of hypertension post-op, controlled by afterload reduction.   Pertinent  Medical History  HFrEF due to ICM DM2 IDA FM CKD3  Significant Hospital Events: Including procedures, antibiotic start and stop dates in addition to other pertinent events   1/14 admission 1/15 L&R heart cath> nonobstructive disease, low filling pressures 1/23 LVAD 1/24 extubated; fluid boluses & milrinone  increased due to frequent LVAD alarms with high PI 1/25 reintubated for respiratory distress, worsening lactic acidosis 1/26 Tmax 100.4 on vanc and zosyn , remains intubated  1/27 extubated, remains with labile blood pressure   Interim History / Subjective:  Extubated this morning. Complains of being locked in a box.  Objective    Blood pressure (!) 88/54, pulse (!) 30, temperature 99.1 F (37.3 C), resp. rate 19, height 5' 5 (1.651 m), weight 78.8 kg, SpO2 94%. PAP: (8-34)/(2-27) 17/9 CVP:  [3 mmHg-49 mmHg] 9 mmHg CO:   [4.1 L/min-5 L/min] 4.2 L/min CI:  [2.3 L/min/m2-2.8 L/min/m2] 2.3 L/min/m2  Vent Mode: PSV;CPAP FiO2 (%):  [40 %] 40 % Set Rate:  [25 bmp] 25 bmp Vt Set:  [450 mL] 450 mL PEEP:  [5 cmH20-8 cmH20] 8 cmH20 Pressure Support:  [8 cmH20] 8 cmH20   Intake/Output Summary (Last 24 hours) at 12/27/2024 1107 Last data filed at 12/27/2024 1100 Gross per 24 hour  Intake 2621.99 ml  Output 4040 ml  Net -1418.01 ml   Filed Weights   12/25/24 0500 12/26/24 0500 12/27/24 0500  Weight: 83.1 kg 83.3 kg 78.8 kg   Examination: General: critically ill appearing female, resting in bed, confused  HENT: [place]/AT, sclera anicteric  Lungs: breathing comfortably on nasal cannula, no wheezes or rales  Cardiovascular: RRR, normal S1 and S2, no m/r/g, serosanguinous chest tube output  Abdomen: soft, non-tender, non-distended  Extremities: warm, dry, trace edema  Neuro: confused, alert to self only, follows commands throughout  GU: foley draining clear yellow urine   I/O -791.4cc/24 hours; +8.8L/admission   Co-ox 67.5  Cl 112 CO2 21 BUN 19 Cr 1.03 Ca 8.3  Phos 1.8  LDH 539 WBC 15.2 Hgb 8.9  Platelets 210 Heparin  level <0.10 INR 1.3  CBG 150-180's  CXR (personally viewed) rotated to the right, PA-cath/ETT/R internal jugular in good position, L CPA obscured by LVAD, low lung volumes   Resolved problem list   Assessment and Plan   Acute on chronic HFrEF due to ICM; NYHA class IV heart failure s/p LVAD on 1/23 -post-op care per TCTS -continue  CCO swan -inotropes per AHF- milrinone  0.375, epi 0.5, NE titrating for MAP 70-90 -stop PAD protocol now that extubated  -wean iNO and start sildenafil  20 mg TID  -continue heparin , planning to start warfarin following extubation  -CVP 8-9, will give 60 mg IV Lasix  today  Acute respiratory failure with hypoxia; reintubated for WOB and lactic acidosis, presumably related to acute infection, now improved; extubated  -continue supplemental oxygen as needed  to maintain SpO2>92% -continue brovana  & yupelri  -diuresis per above -push IS, mobility   Fevers, leukocytosis- initially suspected post-op inflammation, but rapid clinical progression more c/w infection-- no clear etiology.  -continue empiric vanc and zosyn    Hyperglycemia; h/o DM. A1c 6.6 -con't glargine to 15 units daily -SSI PRN; keep q4h for now -adding TF coverage 3 units q4h with hold parameters -goal BG 140-180  Anticipated acute blood loss anemia, s/p 1 PRBC yesterday with appropriate response  -continue to monitor   GERD -IV PPI; switch to PO when able   H/o migraine -family brought in PTA Ubrelvy - continue this  H/o FM -con't PTA trintellix  -optimize mobility and sleep   CKD 3b Non-anion gap acidosis secondary to hyperchloremia -strict I/O -renally dose meds, avoid nephrotoxic meds -encourage oral intake of free water when able to swallow safely   At risk for malnutrition  -advance diet as tolerated   Plan discussed with TCTS and advanced hear failure.  Labs   CBC: Recent Labs  Lab 12/24/24 0406 12/24/24 0523 12/24/24 1700 12/24/24 1822 12/25/24 0414 12/25/24 0515 12/26/24 0454 12/26/24 0455 12/27/24 0519 12/27/24 0642  WBC 17.6*  --  18.5*  --  23.6*  --  21.0*  --  15.2*  --   NEUTROABS 13.6*  --   --   --  19.9*  --  17.1*  --  11.2*  --   HGB 9.1*   < > 9.4*   < > 8.5* 8.8* 7.6* 7.8* 8.9* 17.3*  HCT 28.3*   < > 28.3*   < > 25.7* 26.0* 22.7* 23.0* 27.5* 51.0*  MCV 94.3  --  95.6  --  96.3  --  94.2  --  92.9  --   PLT 202  --  198  --  182  --  184  --  210  --    < > = values in this interval not displayed.    Basic Metabolic Panel: Recent Labs  Lab 12/24/24 0406 12/24/24 0523 12/24/24 1700 12/24/24 1822 12/25/24 0414 12/25/24 0515 12/25/24 2051 12/26/24 0454 12/26/24 0455 12/27/24 0519 12/27/24 0642 12/27/24 0734  NA 140   < > 136   < > 136   < > 138 138 139  --  144 143  K 4.9   < > 4.0   < > 4.3   < > 3.9 3.6 3.5  --  4.0  4.0  CL 108  --  107  --  106  --  107 108  --   --   --  112*  CO2 19*  --  18*  --  19*  --  19* 19*  --   --   --  21*  GLUCOSE 180*  --  156*  --  181*  --  195* 231*  --   --   --  168*  BUN 12  --  10  --  11  --  19 23  --   --   --  19  CREATININE 1.52*  --  1.30*  --  1.15*  --  1.38* 1.34*  --   --   --  1.03*  CALCIUM  8.5*  --  8.5*  --  8.4*  --  8.1* 8.2*  --   --   --  8.3*  MG 2.8*  --  2.5*  --  2.4  --  2.6* 2.7*  --  2.3  --   --   PHOS 2.9  --   --   --  2.0*  --   --  2.1*  --  1.8*  --   --    < > = values in this interval not displayed.   GFR: Estimated Creatinine Clearance: 49.7 mL/min (A) (by C-G formula based on SCr of 1.03 mg/dL (H)). Recent Labs  Lab 12/24/24 1700 12/25/24 0044 12/25/24 0414 12/25/24 1051 12/25/24 1344 12/26/24 0454 12/27/24 0519  WBC 18.5*  --  23.6*  --   --  21.0* 15.2*  LATICACIDVEN  --  0.9  --  3.9* 1.7  --   --     Critical care time:     The patient is critically ill with multiple organ system failure and requires high complexity decision making for assessment and support, frequent evaluation and titration of therapies, advanced monitoring, review of radiographic studies and interpretation of complex data.   Critical Care Time devoted to patient care services, exclusive of separately billable procedures, described in this note is 50 minutes.  Rexene LOISE Blush, PA-C 12/27/24 11:22 AM [facility]  For contact information, see Amion.  After hours, 7PM- 7AM, please call on call APP for 2H.        "

## 2024-12-27 NOTE — Progress Notes (Addendum)
 HR 140, has been in sinus rhythm up to 120s. EKG-- not diagnostic due to artifact from LVAD. Tele reviewed>  appears to have p-waves. Reviewed with AHF.  Febrile to 38.6 Not in respiratory distress, minimal diaphoresis  Cooling blanket, fans Amiodarone  to reduce risk of VT  Leita [name], DO 12/27/24 5:18 PM [facility]  For contact information, see Amion. If no response to pager, please call PCCM 2H APP. After hours, 7PM- 7AM, please call on call APP for 2H.   HR now 160, SCvO2 and pulse ox dropping. Fever the same.  More tchypneic, starting to use accessory muscles diaphoretic  Tele still with p-waves PA pressures and CVP higher with tachycardia  Getting her on cooling blanket now Bolus amiodarone  previously changed antibiotics to meropenem  + vanc Trial of fentanyl  25mcg to control pain Restart iNO BiPAP; will likely need some ativan  for this because she disliked it previously Steroids to improve inflammation  D/w AHF. High risk for reintubation, but seems to be driven by tachycardia from fever. Very similar presentation to 1/25. Fever seems non-infectionous/ inflammatory.  Additional cc time:  Leita [name], DO 12/27/24 6:30 PM [facility]  For contact information, see Amion. If no response to pager, please call PCCM 2H APP. After hours, 7PM- 7AM, please call on call APP for 2H.

## 2024-12-27 NOTE — Progress Notes (Signed)
 iNO turned off per Rexene PA CCM verbal order.

## 2024-12-27 NOTE — Procedures (Signed)
 Extubation Procedure Note  Patient Details:   Name: Karen Warner DOB: 07-30-1951 MRN: [id number]   Airway Documentation:    Vent end date: 12/27/24 Vent end time: 1048   Evaluation  O2 sats: stable throughout Complications: No apparent complications Patient did tolerate procedure well. Bilateral Breath Sounds: Clear, Diminished   Yes  Pt extubated per physician order to 4L without complication. Prior to extubation, pt suctioned via ETT/orally and positive cuff leak heard. Upon extubation, pt able to speak name, give a good cough and no stridor was heard at this time.   Geofm FORBES Batty 12/27/2024, 10:56 AM

## 2024-12-27 NOTE — Progress Notes (Signed)
 LVAD Coordinator Rounding Note:  Admitted 12/14/24 due to CHF exacerbation. Underwent LVAD evaluation.   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  1/23: S/P HMIII Implant.Intra-op speed placed at 4600 due to low filling pressures.   1/24: Extubated. Had some low flows. Cleveprex started. 1/25: Re-intubated with desaturation, increased work of breathing 1/26: Remains intubated.Cleveprex stopped.   Pt extubated this morning. Pt's daughter Mercy at bedside. Multiple low flow alarms noted on interrogation. Most alarms coincide with fluctuations in blood pressure.  Daughter Angie observed dressing change today at bedside.   Vital signs: Temp: 99.5 HR: 108 Doppler Pressure: 78 Arterial BP: 80/65 (72) O2 Sat: 97% on 4L [place]% Wt: 183.6>173.7 lbs    LVAD interrogation reveals:  Speed:5100 Flow: 3.8 Power: 3.4 w PI: 6.1   Alarms: multiple low flow alarms Events:  none  Hematocrit: 23 Fixed speed: 5100 Low speed limit: 4800   Drive Line: Existing VAD dressing removed and site care performed using sterile technique. Drive line exit site cleaned with Chlora prep applicators x 2, allowed to dry, and Silverlon patch with gauze dressing applied. Exit site unincorporated, the velour is fully implanted at exit site. Small amount of sanguinous drainage noted on previous dressing. No redness, tenderness, foul odor or rash noted. Drive line anchor re-applied. Continue daily dressing changes by VAD coordinator or nurse champion. Next dressing change due 12/28/24.       Labs:  LDH trend: [phone]  INR trend: 1.3>1.5>1.6>1.3  WBC trend: 18.5>23.6>21.0>15.2  Hgb trend: 9.2>8.5>7.6>8.9  Anticoagulation Plan: - INR Goal: 2.0 - 2.5 - ASA Dose: none - Heparin  gtt per pharmacy - Coumadin  dosing per pharmacy  Blood Products:  - Intra-op 1/23:   2 PRBC  4 FFP  225 cc cell saver - Post op:  1/26: 1 PRBC  Device: - [place]  Scientific -Therapies: OFF  Arrythmias:   Respiratory: Extubated 1/24. Reintubated 1/25 due to respiratory distress. Nitric weaned off and extuabted 1/27.  Infection:   Renal:  -BUN/CRT: 10/1.3>11/1.15>23/1.34>19/1.03  Adverse Events on VAD: -  Drips:  Epinephrine  0.5 mcg/min Milrinone  0.375 mcg/kg/min Fentanyl  125 mcg/hr---OFF Precedex  1.1 mcg/kg/min---OFF Cleviprex  1 mg/hr Heparin  500 units/hr  Patient Education:  Pt's daughter Mercy observed dressing change today at bedside  Plan/Recommendations:  1. Page VAD coordinator with any drive line or equipment concerns 2. Daily drive line dressing changes per VAD coordinator or nurse champion  Schuyler Lunger RN, BSN VAD Coordinator 24/7 Pager [phone]

## 2024-12-27 NOTE — Progress Notes (Signed)
 Pharmacy Electrolyte Replacement  Recent Labs:  Recent Labs    12/27/24 0519 12/27/24 0642 12/27/24 1855  K  --    < > 3.0*  MG 2.3  --   --   PHOS 1.8*  --   --   CREATININE  --    < > 0.94   < > = values in this interval not displayed.    Low Critical Values (K </= 2.5, Phos </= 1, Mg </= 1) Present: None  MD Contacted: n/a  Plan: RN contacted me that patient is unable to take last 2 doses of K Phos  Neutral. New labs returned that show low K as well. Orders changed to 30 mmol Kphos IV and 2 runs of IV KCL  Rankin Sams, PharmD, BCCCP Clinical Pharmacist

## 2024-12-27 NOTE — Progress Notes (Signed)
 TCTS Evening Rounds:  Hemodynamically stable on milrinone  0.375 and epi 0.5.  Extubated today and doing ok so far.  -2460 cc so far today. Creat 1.03 this am.  Starting Coumadin  tonight.

## 2024-12-28 ENCOUNTER — Inpatient Hospital Stay (HOSPITAL_COMMUNITY)

## 2024-12-28 ENCOUNTER — Encounter (HOSPITAL_COMMUNITY): Payer: Self-pay | Admitting: Surgery

## 2024-12-28 DIAGNOSIS — I255 Ischemic cardiomyopathy: Secondary | ICD-10-CM | POA: Diagnosis not present

## 2024-12-28 DIAGNOSIS — J9601 Acute respiratory failure with hypoxia: Secondary | ICD-10-CM | POA: Diagnosis not present

## 2024-12-28 DIAGNOSIS — F419 Anxiety disorder, unspecified: Secondary | ICD-10-CM

## 2024-12-28 DIAGNOSIS — I50811 Acute right heart failure: Secondary | ICD-10-CM | POA: Diagnosis not present

## 2024-12-28 DIAGNOSIS — R509 Fever, unspecified: Secondary | ICD-10-CM

## 2024-12-28 DIAGNOSIS — D62 Acute posthemorrhagic anemia: Secondary | ICD-10-CM | POA: Diagnosis not present

## 2024-12-28 DIAGNOSIS — Z794 Long term (current) use of insulin: Secondary | ICD-10-CM | POA: Diagnosis not present

## 2024-12-28 DIAGNOSIS — K219 Gastro-esophageal reflux disease without esophagitis: Secondary | ICD-10-CM | POA: Diagnosis not present

## 2024-12-28 DIAGNOSIS — E1122 Type 2 diabetes mellitus with diabetic chronic kidney disease: Secondary | ICD-10-CM | POA: Diagnosis not present

## 2024-12-28 DIAGNOSIS — E1165 Type 2 diabetes mellitus with hyperglycemia: Secondary | ICD-10-CM | POA: Diagnosis not present

## 2024-12-28 DIAGNOSIS — I130 Hypertensive heart and chronic kidney disease with heart failure and stage 1 through stage 4 chronic kidney disease, or unspecified chronic kidney disease: Secondary | ICD-10-CM | POA: Diagnosis not present

## 2024-12-28 DIAGNOSIS — I5023 Acute on chronic systolic (congestive) heart failure: Secondary | ICD-10-CM | POA: Diagnosis not present

## 2024-12-28 DIAGNOSIS — N1832 Chronic kidney disease, stage 3b: Secondary | ICD-10-CM | POA: Diagnosis not present

## 2024-12-28 LAB — POCT I-STAT 7, (LYTES, BLD GAS, ICA,H+H)
Acid-Base Excess: 0 mmol/L (ref 0.0–2.0)
Acid-Base Excess: 0 mmol/L (ref 0.0–2.0)
Acid-Base Excess: 0 mmol/L (ref 0.0–2.0)
Acid-base deficit: 1 mmol/L (ref 0.0–2.0)
Bicarbonate: 21.6 mmol/L (ref 20.0–28.0)
Bicarbonate: 23.5 mmol/L (ref 20.0–28.0)
Bicarbonate: 21.5 mmol/L (ref 20.0–28.0)
Bicarbonate: 21.9 mmol/L (ref 20.0–28.0)
Calcium, Ion: 1.13 mmol/L — ABNORMAL LOW (ref 1.15–1.40)
Calcium, Ion: 1.13 mmol/L — ABNORMAL LOW (ref 1.15–1.40)
Calcium, Ion: 1.09 mmol/L — ABNORMAL LOW (ref 1.15–1.40)
Calcium, Ion: 1.12 mmol/L — ABNORMAL LOW (ref 1.15–1.40)
HCT: 29 % — ABNORMAL LOW (ref 36.0–46.0)
HCT: 29 % — ABNORMAL LOW (ref 36.0–46.0)
HCT: 29 % — ABNORMAL LOW (ref 36.0–46.0)
HCT: 28 % — ABNORMAL LOW (ref 36.0–46.0)
Hemoglobin: 9.9 g/dL — ABNORMAL LOW (ref 12.0–15.0)
Hemoglobin: 9.9 g/dL — ABNORMAL LOW (ref 12.0–15.0)
Hemoglobin: 9.9 g/dL — ABNORMAL LOW (ref 12.0–15.0)
Hemoglobin: 9.5 g/dL — ABNORMAL LOW (ref 12.0–15.0)
O2 Saturation: 92 %
O2 Saturation: 100 %
O2 Saturation: 98 %
O2 Saturation: 98 %
Patient temperature: 38
Patient temperature: 37.8
Patient temperature: 37.5
Patient temperature: 37.4
Potassium: 3.4 mmol/L — ABNORMAL LOW (ref 3.5–5.1)
Potassium: 3.2 mmol/L — ABNORMAL LOW (ref 3.5–5.1)
Potassium: 3.5 mmol/L (ref 3.5–5.1)
Potassium: 3.5 mmol/L (ref 3.5–5.1)
Sodium: 141 mmol/L (ref 135–145)
Sodium: 141 mmol/L (ref 135–145)
Sodium: 140 mmol/L (ref 135–145)
Sodium: 141 mmol/L (ref 135–145)
TCO2: 22 mmol/L (ref 22–32)
TCO2: 24 mmol/L (ref 22–32)
TCO2: 22 mmol/L (ref 22–32)
TCO2: 23 mmol/L (ref 22–32)
pCO2 arterial: 29.5 mmHg — ABNORMAL LOW (ref 32–48)
pCO2 arterial: 32.3 mmHg (ref 32–48)
pCO2 arterial: 26.2 mmHg — ABNORMAL LOW (ref 32–48)
pCO2 arterial: 27.9 mmHg — ABNORMAL LOW (ref 32–48)
pH, Arterial: 7.476 — ABNORMAL HIGH (ref 7.35–7.45)
pH, Arterial: 7.472 — ABNORMAL HIGH (ref 7.35–7.45)
pH, Arterial: 7.524 — ABNORMAL HIGH (ref 7.35–7.45)
pH, Arterial: 7.504 — ABNORMAL HIGH (ref 7.35–7.45)
pO2, Arterial: 63 mmHg — ABNORMAL LOW (ref 83–108)
pO2, Arterial: 195 mmHg — ABNORMAL HIGH (ref 83–108)
pO2, Arterial: 94 mmHg (ref 83–108)
pO2, Arterial: 96 mmHg (ref 83–108)

## 2024-12-28 LAB — CBC WITH DIFFERENTIAL/PLATELET
Abs Immature Granulocytes: 0.12 10*3/uL — ABNORMAL HIGH (ref 0.00–0.07)
Basophils Absolute: 0 10*3/uL (ref 0.0–0.1)
Basophils Relative: 0 %
Eosinophils Absolute: 0 10*3/uL (ref 0.0–0.5)
Eosinophils Relative: 0 %
HCT: 28.5 % — ABNORMAL LOW (ref 36.0–46.0)
Hemoglobin: 9.5 g/dL — ABNORMAL LOW (ref 12.0–15.0)
Immature Granulocytes: 1 %
Lymphocytes Relative: 8 %
Lymphs Abs: 1.4 10*3/uL (ref 0.7–4.0)
MCH: 30.1 pg (ref 26.0–34.0)
MCHC: 33.3 g/dL (ref 30.0–36.0)
MCV: 90.2 fL (ref 80.0–100.0)
Monocytes Absolute: 1.9 10*3/uL — ABNORMAL HIGH (ref 0.1–1.0)
Monocytes Relative: 11 %
Neutro Abs: 13.9 10*3/uL — ABNORMAL HIGH (ref 1.7–7.7)
Neutrophils Relative %: 80 %
Platelets: 248 10*3/uL (ref 150–400)
RBC: 3.16 MIL/uL — ABNORMAL LOW (ref 3.87–5.11)
RDW: 17.8 % — ABNORMAL HIGH (ref 11.5–15.5)
WBC: 17.4 10*3/uL — ABNORMAL HIGH (ref 4.0–10.5)
nRBC: 0.5 % — ABNORMAL HIGH (ref 0.0–0.2)

## 2024-12-28 LAB — HEPATIC FUNCTION PANEL
ALT: 6 U/L (ref 0–44)
AST: 39 U/L (ref 15–41)
Albumin: 3.6 g/dL (ref 3.5–5.0)
Alkaline Phosphatase: 98 U/L (ref 38–126)
Bilirubin, Direct: 0.2 mg/dL (ref 0.0–0.2)
Indirect Bilirubin: 0.3 mg/dL (ref 0.3–0.9)
Total Bilirubin: 0.5 mg/dL (ref 0.0–1.2)
Total Protein: 6.6 g/dL (ref 6.5–8.1)

## 2024-12-28 LAB — RENAL FUNCTION PANEL
Albumin: 3.5 g/dL (ref 3.5–5.0)
Anion gap: 15 (ref 5–15)
BUN: 16 mg/dL (ref 8–23)
CO2: 22 mmol/L (ref 22–32)
Calcium: 8.6 mg/dL — ABNORMAL LOW (ref 8.9–10.3)
Chloride: 104 mmol/L (ref 98–111)
Creatinine, Ser: 1.09 mg/dL — ABNORMAL HIGH (ref 0.44–1.00)
GFR, Estimated: 53 mL/min — ABNORMAL LOW
Glucose, Bld: 208 mg/dL — ABNORMAL HIGH (ref 70–99)
Phosphorus: 2.4 mg/dL — ABNORMAL LOW (ref 2.5–4.6)
Potassium: 3.7 mmol/L (ref 3.5–5.1)
Sodium: 140 mmol/L (ref 135–145)

## 2024-12-28 LAB — PROTIME-INR
INR: 1.4 — ABNORMAL HIGH (ref 0.8–1.2)
Prothrombin Time: 17.6 s — ABNORMAL HIGH (ref 11.4–15.2)

## 2024-12-28 LAB — MAGNESIUM
Magnesium: 1.8 mg/dL (ref 1.7–2.4)
Magnesium: 2.2 mg/dL (ref 1.7–2.4)

## 2024-12-28 LAB — GLUCOSE, CAPILLARY
Glucose-Capillary: 178 mg/dL — ABNORMAL HIGH (ref 70–99)
Glucose-Capillary: 179 mg/dL — ABNORMAL HIGH (ref 70–99)
Glucose-Capillary: 189 mg/dL — ABNORMAL HIGH (ref 70–99)
Glucose-Capillary: 211 mg/dL — ABNORMAL HIGH (ref 70–99)
Glucose-Capillary: 211 mg/dL — ABNORMAL HIGH (ref 70–99)
Glucose-Capillary: 250 mg/dL — ABNORMAL HIGH (ref 70–99)

## 2024-12-28 LAB — BASIC METABOLIC PANEL WITH GFR
Anion gap: 13 (ref 5–15)
Anion gap: 14 (ref 5–15)
BUN: 15 mg/dL (ref 8–23)
BUN: 16 mg/dL (ref 8–23)
CO2: 23 mmol/L (ref 22–32)
CO2: 22 mmol/L (ref 22–32)
Calcium: 8.6 mg/dL — ABNORMAL LOW (ref 8.9–10.3)
Calcium: 8.6 mg/dL — ABNORMAL LOW (ref 8.9–10.3)
Chloride: 102 mmol/L (ref 98–111)
Chloride: 104 mmol/L (ref 98–111)
Creatinine, Ser: 1 mg/dL (ref 0.44–1.00)
Creatinine, Ser: 1.05 mg/dL — ABNORMAL HIGH (ref 0.44–1.00)
GFR, Estimated: 59 mL/min — ABNORMAL LOW
GFR, Estimated: 55 mL/min — ABNORMAL LOW
Glucose, Bld: 234 mg/dL — ABNORMAL HIGH (ref 70–99)
Glucose, Bld: 208 mg/dL — ABNORMAL HIGH (ref 70–99)
Potassium: 3.6 mmol/L (ref 3.5–5.1)
Potassium: 3.7 mmol/L (ref 3.5–5.1)
Sodium: 138 mmol/L (ref 135–145)
Sodium: 140 mmol/L (ref 135–145)

## 2024-12-28 LAB — CG4 I-STAT (LACTIC ACID)
Lactic Acid, Venous: 1.4 mmol/L (ref 0.5–1.9)
Lactic Acid, Venous: 1.9 mmol/L (ref 0.5–1.9)

## 2024-12-28 LAB — PHOSPHORUS: Phosphorus: 4 mg/dL (ref 2.5–4.6)

## 2024-12-28 LAB — VANCOMYCIN, TROUGH: Vancomycin Tr: 14 ug/mL — ABNORMAL LOW (ref 15–20)

## 2024-12-28 LAB — LACTATE DEHYDROGENASE: LDH: 612 U/L — ABNORMAL HIGH (ref 105–235)

## 2024-12-28 LAB — COOXEMETRY PANEL
Carboxyhemoglobin: 1.7 % — ABNORMAL HIGH (ref 0.5–1.5)
Methemoglobin: 1 % (ref 0.0–1.5)
O2 Saturation: 70.9 %
Total hemoglobin: 9.4 g/dL — ABNORMAL LOW (ref 12.0–16.0)

## 2024-12-28 LAB — HEPARIN LEVEL (UNFRACTIONATED): Heparin Unfractionated: <0.1 [IU]/mL — ABNORMAL LOW (ref 0.30–0.70)

## 2024-12-28 LAB — PROCALCITONIN: Procalcitonin: 2.48 ng/mL

## 2024-12-28 MED ORDER — FENTANYL CITRATE (PF) 50 MCG/ML IJ SOSY
PREFILLED_SYRINGE | INTRAMUSCULAR | Status: AC
  Filled 2024-12-28: qty 1

## 2024-12-28 MED ORDER — INSULIN ASPART 100 UNIT/ML IJ SOLN
3.0000 [IU] | INTRAMUSCULAR | Status: DC
  Administered 2024-12-28 – 2024-12-29 (×4): 3 [IU] via SUBCUTANEOUS
  Filled 2024-12-28 (×4): qty 3

## 2024-12-28 MED ORDER — ORAL CARE MOUTH RINSE
15.0000 mL | OROMUCOSAL | Status: AC
  Administered 2024-12-28 – 2025-01-06 (×37): 15 mL via OROMUCOSAL

## 2024-12-28 MED ORDER — LIDOCAINE 5 % EX PTCH
2.0000 | MEDICATED_PATCH | CUTANEOUS | Status: AC
  Administered 2024-12-28 – 2025-01-06 (×9): 2 via TRANSDERMAL
  Filled 2024-12-28 (×9): qty 2

## 2024-12-28 MED ORDER — TRAZODONE HCL 50 MG PO TABS: 50.0000 mg | ORAL_TABLET | Freq: Every day | ORAL | Status: DC

## 2024-12-28 MED ORDER — LORAZEPAM 2 MG/ML IJ SOLN
1.0000 mg | INTRAMUSCULAR | Status: AC | PRN
  Administered 2024-12-28 – 2025-01-05 (×7): 1 mg via INTRAVENOUS
  Filled 2024-12-28 (×8): qty 1

## 2024-12-28 MED ORDER — ORAL CARE MOUTH RINSE: 15.0000 mL | OROMUCOSAL | Status: AC | PRN

## 2024-12-28 MED ORDER — LORAZEPAM 2 MG/ML IJ SOLN
0.5000 mg | Freq: Once | INTRAMUSCULAR | Status: AC
  Administered 2024-12-28: 0.5 mg via INTRAVENOUS
  Filled 2024-12-28: qty 1

## 2024-12-28 MED ORDER — FUROSEMIDE 10 MG/ML IJ SOLN
20.0000 mg/h | INTRAVENOUS | Status: DC
  Administered 2024-12-28 – 2024-12-29 (×2): 10 mg/h via INTRAVENOUS
  Administered 2024-12-29 – 2024-12-30 (×2): 15 mg/h via INTRAVENOUS
  Administered 2024-12-30 – 2024-12-31 (×2): 20 mg/h via INTRAVENOUS
  Administered 2024-12-31: 10 mg/h via INTRAVENOUS
  Filled 2024-12-28 (×3): qty 20
  Filled 2024-12-28: qty 200
  Filled 2024-12-28: qty 20
  Filled 2024-12-28: qty 200
  Filled 2024-12-28: qty 20
  Filled 2024-12-28: qty 200

## 2024-12-28 MED ORDER — HYDRALAZINE HCL 20 MG/ML IJ SOLN
20.0000 mg | INTRAMUSCULAR | Status: DC
  Administered 2024-12-28 – 2024-12-29 (×4): 20 mg via INTRAVENOUS
  Filled 2024-12-28 (×4): qty 1

## 2024-12-28 MED ORDER — FENTANYL CITRATE (PF) 50 MCG/ML IJ SOSY: 25.0000 ug | PREFILLED_SYRINGE | Freq: Once | INTRAMUSCULAR | Status: AC

## 2024-12-28 MED ORDER — PIVOT 1.5 CAL PO LIQD
1000.0000 mL | ORAL | Status: DC
  Administered 2024-12-28: 1000 mL

## 2024-12-28 MED ORDER — HYDRALAZINE HCL 20 MG/ML IJ SOLN
10.0000 mg | Freq: Once | INTRAMUSCULAR | Status: AC
  Administered 2024-12-28: 10 mg via INTRAVENOUS
  Filled 2024-12-28: qty 1

## 2024-12-28 MED ORDER — POTASSIUM CHLORIDE 10 MEQ/50ML IV SOLN
10.0000 meq | INTRAVENOUS | Status: AC
  Administered 2024-12-28 (×4): 10 meq via INTRAVENOUS
  Filled 2024-12-28 (×4): qty 50

## 2024-12-28 MED ORDER — HYDRALAZINE HCL 20 MG/ML IJ SOLN
10.0000 mg | Freq: Four times a day (QID) | INTRAMUSCULAR | Status: DC | PRN
  Administered 2024-12-29: 10 mg via INTRAVENOUS
  Filled 2024-12-28: qty 1

## 2024-12-28 MED ORDER — POTASSIUM CHLORIDE 10 MEQ/50ML IV SOLN
10.0000 meq | INTRAVENOUS | Status: AC
  Administered 2024-12-28 (×3): 10 meq via INTRAVENOUS
  Filled 2024-12-28 (×3): qty 50

## 2024-12-28 MED ORDER — OXYCODONE HCL 5 MG PO TABS
5.0000 mg | ORAL_TABLET | Freq: Once | ORAL | Status: AC | PRN
  Administered 2024-12-28: 5 mg via ORAL
  Filled 2024-12-28: qty 1

## 2024-12-28 MED ORDER — FENTANYL CITRATE (PF) 50 MCG/ML IJ SOSY
25.0000 ug | PREFILLED_SYRINGE | INTRAMUSCULAR | Status: AC | PRN
  Administered 2024-12-28 – 2025-01-06 (×15): 25 ug via INTRAVENOUS
  Filled 2024-12-28 (×15): qty 1

## 2024-12-28 MED ORDER — ACETAMINOPHEN 10 MG/ML IV SOLN
1000.0000 mg | Freq: Four times a day (QID) | INTRAVENOUS | Status: AC
  Administered 2024-12-28 – 2024-12-29 (×4): 1000 mg via INTRAVENOUS
  Filled 2024-12-28 (×4): qty 100

## 2024-12-28 MED ORDER — HYDRALAZINE HCL 20 MG/ML IJ SOLN: 20.0000 mg | Freq: Three times a day (TID) | INTRAMUSCULAR | Status: DC

## 2024-12-28 MED ORDER — WARFARIN SODIUM 2.5 MG PO TABS
2.5000 mg | ORAL_TABLET | Freq: Once | ORAL | Status: AC
  Administered 2024-12-28: 2.5 mg via ORAL
  Filled 2024-12-28: qty 1

## 2024-12-28 MED ORDER — FUROSEMIDE 10 MG/ML IJ SOLN
80.0000 mg | Freq: Once | INTRAMUSCULAR | Status: AC
  Administered 2024-12-28: 80 mg via INTRAVENOUS
  Filled 2024-12-28: qty 8

## 2024-12-28 MED ORDER — HYDRALAZINE HCL 20 MG/ML IJ SOLN
5.0000 mg | Freq: Once | INTRAMUSCULAR | Status: AC
  Administered 2024-12-28: 5 mg via INTRAVENOUS
  Filled 2024-12-28: qty 1

## 2024-12-28 MED ORDER — FENTANYL CITRATE (PF) 50 MCG/ML IJ SOSY
PREFILLED_SYRINGE | INTRAMUSCULAR | Status: AC
  Administered 2024-12-28: 25 ug via INTRAVENOUS
  Filled 2024-12-28: qty 1

## 2024-12-28 MED ORDER — FENTANYL CITRATE (PF) 50 MCG/ML IJ SOSY
12.5000 ug | PREFILLED_SYRINGE | Freq: Once | INTRAMUSCULAR | Status: AC
  Administered 2024-12-28: 12.5 ug via INTRAVENOUS

## 2024-12-28 MED ORDER — NITROGLYCERIN IN D5W 200-5 MCG/ML-% IV SOLN
0.0000 ug/min | INTRAVENOUS | Status: DC
  Administered 2024-12-28: 10 ug/min via INTRAVENOUS
  Filled 2024-12-28: qty 250

## 2024-12-28 MED ORDER — MAGNESIUM SULFATE 2 GM/50ML IV SOLN
2.0000 g | Freq: Once | INTRAVENOUS | Status: AC
  Administered 2024-12-28: 2 g via INTRAVENOUS
  Filled 2024-12-28: qty 50

## 2024-12-28 MED FILL — Sodium Bicarbonate IV Soln 8.4%: INTRAVENOUS | Qty: 100 | Status: AC

## 2024-12-28 MED FILL — Heparin Sodium (Porcine) Inj 1000 Unit/ML: INTRAMUSCULAR | Qty: 20 | Status: AC

## 2024-12-28 MED FILL — Sodium Chloride IV Soln 0.9%: INTRAVENOUS | Qty: 1000 | Status: AC

## 2024-12-28 MED FILL — Mannitol IV Soln 20%: INTRAVENOUS | Qty: 500 | Status: AC

## 2024-12-28 MED FILL — Heparin Sodium (Porcine) Inj 1000 Unit/ML: Qty: 1000 | Status: AC

## 2024-12-28 MED FILL — Electrolyte-R (PH 7.4) Solution: INTRAVENOUS | Qty: 4000 | Status: AC

## 2024-12-28 MED FILL — Magnesium Sulfate Inj 50%: INTRAMUSCULAR | Qty: 10 | Status: AC

## 2024-12-28 MED FILL — Potassium Chloride Inj 2 mEq/ML: INTRAVENOUS | Qty: 40 | Status: AC

## 2024-12-28 NOTE — Progress Notes (Addendum)
 Pharmacy Antibiotic Note  Karen Warner is a 74 y.o. female admitted on 12/14/2024 with sepsis.  Pharmacy has been consulted for vancomycin  dosing.   Patient is POD5 after LVAD implantation. WBC down 17.4, but patient continues to fever. Escalated yesterday to vanc/meropenem . LA decreased to 1.4 today. Procal 2.48. Respiratory status worsening acutely requiring Bipap.   Plan: Check vanc trough today prior to 3rd dose  Goal 15-20 for sepsis   Height: 5' 5 (165.1 cm) Weight: 77.8 kg (171 lb 8.3 oz) (controller in bed) IBW/kg (Calculated) : 57  Temp (24hrs), Avg:100.3 F (37.9 C), Min:99.1 F (37.3 C), Max:101.5 F (38.6 C)  Recent Labs  Lab 12/24/24 1700 12/25/24 0044 12/25/24 0414 12/25/24 1051 12/25/24 1344 12/25/24 2051 12/26/24 0454 12/27/24 0519 12/27/24 0734 12/27/24 1855 12/28/24 0435 12/28/24 0949 12/28/24 1234  WBC 18.5*  --  23.6*  --   --   --  21.0* 15.2*  --   --  17.4*  --   --   CREATININE 1.30*  --  1.15*  --   --  1.38* 1.34*  --  1.03* 0.94 1.00  --   --   LATICACIDVEN  --  0.9  --  3.9* 1.7  --   --   --   --   --   --  1.4 1.9    Estimated Creatinine Clearance: 50.9 mL/min (by C-G formula based on SCr of 1 mg/dL).    Allergies[1]  Antimicrobials this admission: Perioperative abx ppx (vanc, rifampin , fluconazole , cefazolin ) Zosyn  1/25 >>1/27 Meropenem  1/28 >>  Vancomycin  1/25 >>   Microbiology results: 1/22 MRSA PCR: negative  Thank you for allowing pharmacy to be a part of this patients care.  Jade M Denninger 12/28/2024 3:49 PM    ADDENDUM:  Vanc trough = 14 mcg/ml - almost therapeutic. Since not quite at steady state, will continue current dose as level likely to trend up some  Rocky Slade, PharmD, BCPS 12/28/2024 5:41 PM       [1]  Allergies Allergen Reactions   Bee Venom Anaphylaxis   Sumatriptan Shortness Of Breath    Migraine worsened   Amoxicillin-Pot Clavulanate Diarrhea and Nausea And Vomiting    GI  Intolerance   Oxycodone  Itching and Hives    Other reaction(s): Other (see comments) Funny feeling in head  off the edge    Buprenorphine Hcl     Other reaction(s): Itching   Clarithromycin Diarrhea    Abdominal pain   Duloxetine  Hcl Other (See Comments)    drowsiness   Hydrocodone Itching   Lactose Intolerance (Gi) Other (See Comments)    Upset stomach, gas/bloating   Liraglutide Other (See Comments)    Abdominal discomfort   Tramadol Hcl Itching

## 2024-12-28 NOTE — Procedures (Signed)
 Cortrak  Person Inserting Tube:  Norie Latendresse T, RD Tube Type:  Cortrak - 43 inches Tube Size:  10 Tube Location:  Right nare Secured by: Bridle Technique Used to Measure Tube Placement:  Marking at nare/corner of mouth Cortrak Secured At:  67 cm Initial Placement Verification:  Xray    Cortrak Tube Team Note:  Consult received to place a Cortrak feeding tube.   X-ray is required and has been ordered by the Cortrak team. Please confirm placement prior to using tube.   If the tube becomes dislodged please keep the tube and contact the Cortrak team at [email or web address] for replacement.  If after hours and replacement cannot be delayed, place a NG tube and confirm placement with an abdominal x-ray.    Trude Ned RD, LDN Contact via [employer].

## 2024-12-28 NOTE — Progress Notes (Signed)
 OT Cancellation Note  Patient Details Name: Joanie Duprey MRN: [id number] DOB: [birth year]   Cancelled Treatment:    Reason Eval/Treat Not Completed: Patient not medically ready. Pt currently on BiPap. RN stated that they are attempting to diurese to attempt to increase respiratory function. OT will continue to follow and return for eval as schedule allows.   Leita PARAS Khloei Spiker 12/28/2024, 9:32 AM  Leita DEL OTR/L Acute Rehabilitation Services Office: [phone]

## 2024-12-28 NOTE — Progress Notes (Signed)
 Inpatient Rehab Admissions Coordinator:   Per therapy reassessments pt was screened for CIR candidacy by Adessa Primiano E Zaydyn Havey, PT, DPT.  Note evaluations limited to bed level and currently on bipap.  Will not place a consult at this time, but follow for stability and progress and place a consult if pt appears to be a candidate.   Reche Lowers, PT, DPT Admissions Coordinator [phone] 12/28/24 1:01 PM

## 2024-12-28 NOTE — Progress Notes (Signed)
 PHARMACY - ANTICOAGULATION CONSULT NOTE  Pharmacy Consult for heparin  bridge to warfarin  Indication: LVAD  Allergies[1]  Patient Measurements: Height: 5' 5 (165.1 cm) Weight: 77.8 kg (171 lb 8.3 oz) (controller in bed) IBW/kg (Calculated) : 57 HEPARIN  DW (KG): 72  Vital Signs: Temp: 99.7 F (37.6 C) (01/28 1420) Temp Source: Bladder (01/28 0800) BP: 102/70 (01/28 0808) Pulse Rate: 123 (01/28 1420)  Labs: Recent Labs    12/26/24 0454 12/26/24 0455 12/26/24 1800 12/27/24 0519 12/27/24 9357 12/27/24 0734 12/27/24 1848 12/27/24 1855 12/27/24 2014 12/28/24 0435 12/28/24 0534 12/28/24 1238  HGB 7.6*   < >  --  8.9*   < >  --    < >  --    < > 9.5* 9.9* 9.9*  HCT 22.7*   < >  --  27.5*   < >  --    < >  --    < > 28.5* 29.0* 29.0*  PLT 184  --   --  210  --   --   --   --   --  248  --   --   LABPROT 20.2*  --   --  16.9*  --   --   --   --   --  17.6*  --   --   INR 1.6*  --   --  1.3*  --   --   --   --   --  1.4*  --   --   HEPARINUNFRC  --   --  <0.10* <0.10*  --   --   --   --   --  <0.10*  --   --   CREATININE 1.34*  --   --   --   --  1.03*  --  0.94  --  1.00  --   --    < > = values in this interval not displayed.    Estimated Creatinine Clearance: 50.9 mL/min (by C-G formula based on SCr of 1 mg/dL).   Medical History: Past Medical History:  Diagnosis Date   AICD (automatic cardioverter/defibrillator) present    Anemia 12/12/2019   Anginal pain    Arthralgia 01/14/2020   Arthritis    Asthma    Atherosclerotic heart disease of native coronary artery without angina pectoris 04/13/2014   CHF (congestive heart failure) (HCC)    Chronic bilateral low back pain with bilateral sciatica 01/13/2019   Chronic bladder pain 11/08/2014   Last Assessment & Plan:  Formatting of this note might be different from the original. Patient has chronic pain syndrome in general I think this is unfortunately worsening her pelvic pain and bladder pain her examination today was  very reassuring I am advised her to use the estrogen cream I did start her on Elavil 10 milligrams at that time if she does tolerated will increase it to 25 milligrams.    Chronic headaches    Chronic idiopathic constipation 12/22/2014   Formatting of this note might be different from the original. Last Assessment & Plan:  For better bowel emptying please use either Citrucel / Benefiber start with 2 tablespoons daily and titrate up or down to effect.  Also please use glycerin  suppositories as needed to assist in evacuation. Last Assessment & Plan:  Formatting of this note might be different from the original. For better bowel empt   Chronic obstructive pulmonary disease, unspecified (HCC) 03/18/2018   CKD (chronic kidney disease) stage 3, GFR 30-59 ml/min (  HCC)    Class 1 obesity due to excess calories with serious comorbidity and body mass index (BMI) of 31.0 to 31.9 in adult 06/26/2020   Colon polyps    COPD (chronic obstructive pulmonary disease) (HCC)    Coronary artery disease    DDD (degenerative disc disease), cervical 01/13/2019   Depression 11/05/2019   Diabetic polyneuropathy associated with type 2 diabetes mellitus (HCC) 02/24/2019   Diverticulitis 05/20/2018   Diverticulitis of colon 03/18/2018   Family history of ischemic heart disease (IHD) 08/16/2013   Fibromyalgia    Fibromyalgia affecting multiple sites 01/14/2020   Gastro-esophageal reflux disease without esophagitis 01/13/2019   Glaucoma 11/08/2014   Hordeolum externum of left upper eyelid 03/13/2020   Hypertension 11/08/2014   IBS (irritable bowel syndrome)    Iron deficiency anemia 01/13/2019   Left renal mass 11/08/2019   Leukocytosis 05/28/2018   Low vitamin B12 level 03/13/2020   Low vitamin D  level 12/12/2019   LV dysfunction 05/31/2019   Malignant essential hypertension 01/22/2016   Migraine, unspecified, not intractable, without status migrainosus 11/08/2014   Mixed hyperlipidemia 01/13/2019   Myocardial  infarction (HCC)    Other premature beats 11/08/2014   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 Last Assessment & Plan:  For vaginal atrophy please place a pea size dab of Estrogen vaginal cream  into the vagina 3 times a week ( Monday, Wednesday, Friday) Last Assessment & Plan:  Formatting of this note might be different from the original. For vaginal atrophy please place a pea size dab of Estrogen vaginal cream  into the vagina     Medications:  Scheduled:   arformoterol   15 mcg Nebulization BID   Chlorhexidine  Gluconate Cloth  6 each Topical Daily   docusate sodium   100 mg Oral Daily   gabapentin   200 mg Oral QHS   hydrALAZINE   20 mg Intravenous Q4H   hydrocortisone  sod succinate (SOLU-CORTEF ) inj  100 mg Intravenous Q12H   insulin  aspart  0-24 Units Subcutaneous Q4H   lidocaine   2 patch Transdermal Q24H   mouth rinse  15 mL Mouth Rinse 4 times per day   pantoprazole   40 mg Oral Daily   polyethylene glycol  17 g Oral Daily   revefenacin   175 mcg Nebulization Daily   sodium chloride  flush  3 mL Intravenous Q12H   Vitamin D  (Ergocalciferol )  50,000 Units Oral Q Sun   warfarin  2.5 mg Oral ONCE-1600   Warfarin - Pharmacist Dosing Inpatient   Does not apply q1600    Assessment: Patient is a 74 year old female with new LVAD placement 1/23. She has been receiving heparin  at 500 units/hr fixed per AHF. She was not on anticoagulation PTA. Pharmacy is consulted to initiate warfarin for LVAD.   Today is post-op day 4. She has a pocket drain that is putting out minimal output. Hgb up 9.9, PLT increasing 248. No signs of  overt bleeding. Baseline INR 1.3, today 1.4. T bili 0.5    Interactions: Patient on bipap with worsening respiratory status. Plan to get cortrak this afternoon and initiate tube feeds which will decrease INR. Also  on hydrocortisone  and meropenem .   Goal of Therapy:  INR 2-2.5   Heparin  level <0.3  Monitor platelets by anticoagulation protocol: Yes   Plan:  Give Warfarin 2.5mg  tonight.  Continue heparin  until INR 1.8 per protocol.  Monitor daily INR, CBC, and HL.    Shadd Dunstan M Shantel Helwig 12/28/2024,3:10 PM       [1]  Allergies Allergen Reactions   Bee Venom Anaphylaxis   Sumatriptan Shortness Of Breath    Migraine worsened   Amoxicillin-Pot Clavulanate Diarrhea and Nausea And Vomiting    GI Intolerance   Oxycodone  Itching and Hives    Other reaction(s): Other (see comments) Funny feeling in head  off the edge    Buprenorphine Hcl     Other reaction(s): Itching   Clarithromycin Diarrhea    Abdominal pain   Duloxetine  Hcl Other (See Comments)    drowsiness   Hydrocodone Itching   Lactose Intolerance (Gi) Other (See Comments)    Upset stomach, gas/bloating   Liraglutide Other (See Comments)    Abdominal discomfort   Tramadol Hcl Itching

## 2024-12-28 NOTE — Progress Notes (Addendum)
 PCCM cross-cover note:  Pt started to feel increasingly anxious overnight, her sats had been stable on Bipap, so gave about one hour break on 10L [place].  She still felt anxious and tachypneic ABG 7.47/29/63/21.  Will try Ativan  0.5mg  and place back on bipap, otherwise has been hemodynamically stable overnight.    6:21 AM Repeat gas on Bipap with pO2 195, turned FiO2 from 100% to 70% C/o continued sternotomy pain, additional Fentanyl  25mcg x1 given Mag and K replaced  Karen SAUNDERS Faria Casella, PA-C

## 2024-12-28 NOTE — Progress Notes (Signed)
 HeartMate 3 Rounding Note  Subjective:    Some anxiety overnight on Bipap. Switched ot 10L [place] but still felt anxious and tachypneic with PO2 63. Given 0.5 mg Ativan  and put back on bipap. Otherwise has been very stable overnight with no low flow alarms.  Milrinone  0.375, epi 0.5. Co-ox 71  PA 25/16, CI 3.2, CVP 8.  -2600 cc yesterday. Wt down 2+ lbs. Still 12+ lbs over preop.   LVAD INTERROGATION:  HeartMate IIl LVAD:  Flow 4 liters/min, speed 5100, power 3, PI 4.2.    Objective:    Vital Signs:   Temp:  [98.8 F (37.1 C)-101.5 F (38.6 C)] 100 F (37.8 C) (01/28 0700) Pulse Rate:  [30-178] 119 (01/28 0700) Resp:  [16-40] 28 (01/28 0700) BP: (88-90)/(54-77) 90/77 (01/27 1850) SpO2:  [84 %-100 %] 99 % (01/28 0700) Arterial Line BP: (71-171)/(47-106) 98/69 (01/28 0700) FiO2 (%):  [40 %-100 %] 70 % (01/28 0630) Weight:  [77.8 kg] 77.8 kg (01/28 0500) Last BM Date : 12/27/24 Mean arterial Pressure 78  Intake/Output:   Intake/Output Summary (Last 24 hours) at 12/28/2024 0714 Last data filed at 12/28/2024 0700 Gross per 24 hour  Intake 2811.24 ml  Output 5450 ml  Net -2638.76 ml     Physical Exam: General:  On bipap. Still looks anxious/tired but alert. Cor: Distant heart sounds with LVAD hum present. Lungs: clear Chest incision healing well.  Exit site looked good per VAD coordinator yesterday. Abdomen: soft, nontender, nondistended. Few bowel sounds. Extremities: mild edema Neuro: alert & orientedx3,  moves all 4 extremities w/o difficulty. Affect pleasant  Telemetry: internally paced 115. Looks to be sensing atrium and pacing ventricle.  Labs: Basic Metabolic Panel: Recent Labs  Lab 12/24/24 0406 12/24/24 9476 12/25/24 0414 12/25/24 0515 12/25/24 2051 12/26/24 0454 12/26/24 0455 12/27/24 0519 12/27/24 9357 12/27/24 0734 12/27/24 1848 12/27/24 1855 12/27/24 2014 12/28/24 0238 12/28/24 0435 12/28/24 0534  NA 140   < > 136   < > 138 138   < >  --    < >  143   < > 138 140 141 138 141  K 4.9   < > 4.3   < > 3.9 3.6   < >  --    < > 4.0   < > 3.0* 3.6 3.4* 3.6 3.2*  CL 108   < > 106  --  107 108  --   --   --  112*  --  103  --   --  102  --   CO2 19*   < > 19*  --  19* 19*  --   --   --  21*  --  20*  --   --  23  --   GLUCOSE 180*   < > 181*  --  195* 231*  --   --   --  168*  --  263*  --   --  234*  --   BUN 12   < > 11  --  19 23  --   --   --  19  --  17  --   --  15  --   CREATININE 1.52*   < > 1.15*  --  1.38* 1.34*  --   --   --  1.03*  --  0.94  --   --  1.00  --   CALCIUM  8.5*   < > 8.4*  --  8.1* 8.2*  --   --   --  8.3*  --  8.1*  --   --  8.6*  --   MG 2.8*   < > 2.4  --  2.6* 2.7*  --  2.3  --   --   --   --   --   --  1.8  --   PHOS 2.9  --  2.0*  --   --  2.1*  --  1.8*  --   --   --   --   --   --  4.0  --    < > = values in this interval not displayed.    Liver Function Tests: Recent Labs  Lab 12/23/24 0223 12/24/24 0406 12/25/24 0414 12/26/24 0454  AST 19 117* 123* 77*  ALT 8 12 11 8   ALKPHOS 62 52 61 68  BILITOT 0.3 0.6 1.6* 0.6  PROT 7.3 5.9* 6.1* 5.6*  ALBUMIN  4.0 3.8 3.9 3.4*   No results for input(s): LIPASE, AMYLASE in the last 168 hours. No results for input(s): AMMONIA in the last 168 hours.  CBC: Recent Labs  Lab 12/24/24 0406 12/24/24 0523 12/24/24 1700 12/24/24 1822 12/25/24 0414 12/25/24 0515 12/26/24 0454 12/26/24 0455 12/27/24 0519 12/27/24 9357 12/27/24 1848 12/27/24 2014 12/28/24 0238 12/28/24 0435 12/28/24 0534  WBC 17.6*  --  18.5*  --  23.6*  --  21.0*  --  15.2*  --   --   --   --  17.4*  --   NEUTROABS 13.6*  --   --   --  19.9*  --  17.1*  --  11.2*  --   --   --   --  13.9*  --   HGB 9.1*   < > 9.4*   < > 8.5*   < > 7.6*   < > 8.9*   < > 9.9* 10.5* 9.9* 9.5* 9.9*  HCT 28.3*   < > 28.3*   < > 25.7*   < > 22.7*   < > 27.5*   < > 29.0* 31.0* 29.0* 28.5* 29.0*  MCV 94.3  --  95.6  --  96.3  --  94.2  --  92.9  --   --   --   --  90.2  --   PLT 202  --  198  --  182  --   184  --  210  --   --   --   --  248  --    < > = values in this interval not displayed.    INR: Recent Labs  Lab 12/24/24 0406 12/25/24 0414 12/26/24 0454 12/27/24 0519 12/28/24 0435  INR 1.3* 1.5* 1.6* 1.3* 1.4*    Other results: EKG:   Imaging: DG CHEST PORT 1 VIEW Result Date: 12/27/2024 EXAM: 1 VIEW XRAY OF THE CHEST 12/27/2024 06:42:00 PM COMPARISON: 12/27/2024 CLINICAL HISTORY: Acute hypoxic respiratory failure. FINDINGS: LINES, TUBES AND DEVICES: Endotracheal tube and enteric tube removed. Right IJ (Internal Jugular) Swan-Ganz catheter in place with tip overlying the expected area of the main pulmonary artery. Left chest pacemaker/AICD (Automatic Implantable Cardioverter Defibrillator) with leads terminating in the right atrium, right ventricle. LVAD (Left Ventricular Assist Device) in place. Right chest battery pack with lead tip extending into the right neck. LUNGS AND PLEURA: Low lung volumes with bronchovascular crowding. Left pleural effusion. Retrocardiac airspace opacities. No pneumothorax. HEART AND MEDIASTINUM: Cardiomegaly. No acute abnormality of the mediastinal silhouette. BONES AND SOFT TISSUES: Sternotomy wires. IMPRESSION: 1. Low lung volumes  with bronchovascular crowding and retrocardiac airspace opacities, which may represent atelectasis or developing pneumonia, in the correct clinical context. 2. Small left pleural effusion. 3. Cardiomegaly. Electronically signed by: Rogelia Myers MD 12/27/2024 07:16 PM EST RP Workstation: [id number]   DG CHEST PORT 1 VIEW Result Date: 12/27/2024 EXAM: 1 VIEW(S) XRAY OF THE CHEST 12/27/2024 01:57:00 AM COMPARISON: 12/26/2024 CLINICAL HISTORY: Hypoxia. FINDINGS: LINES, TUBES AND DEVICES: Left chest cardiac device, LVAD, right IJ PA catheter, enteric tube, and endotracheal tube in place. Right neck stimulator device noted. LUNGS AND PLEURA: Low lung volumes with layering left pleural effusion and associated atelectasis. No  pneumothorax. HEART AND MEDIASTINUM: Cardiomegaly, unchanged. BONES AND SOFT TISSUES: No acute osseous abnormality. IMPRESSION: 1. Low lung volumes with layering left pleural effusion and associated atelectasis. Electronically signed by: Oneil Devonshire MD 12/27/2024 02:03 AM EST RP Workstation: [id number]   ECHOCARDIOGRAM LIMITED Result Date: 12/26/2024    ECHOCARDIOGRAM LIMITED REPORT   Patient Name:   Karen Warner Date of Exam: 12/26/2024 Medical Rec #:  [id number]                 Height:       65.0 in Accession #:    [id number]                Weight:       183.6 lb Date of Birth:  May 13, 1951                 BSA:          1.908 m Patient Age:    73 years                  BP:           0/0 mmHg Patient Gender: F                         HR:           104 bpm. Exam Location:  Inpatient Procedure: Limited Echo (Both Spectral and Color Flow Doppler were utilized            during procedure). Indications:    LVAD evaluation Z95.811  History:        Patient has prior history of Echocardiogram examinations, most                 recent 11/18/2024.  Sonographer:    Sydnee Wilson RDCS Referring Phys: 2 DALTON S MCLEAN IMPRESSIONS  1. Left ventricular ejection fraction, by estimation, is <20%. The left ventricle has severely decreased function. The left ventricle demonstrates global hypokinesis. There is mild concentric left ventricular hypertrophy. LVAD inflow cannula noted at apex.  2. Right ventricular systolic function is moderately reduced. The right ventricular size is normal.  3. The mitral valve is normal in structure. No evidence of mitral valve regurgitation. No evidence of mitral stenosis.  4. The aortic valve does not open. No aortic insufficiency.  5. A small pericardial effusion is present. The pericardial effusion is circumferential.  6. Ramp echo. Speed at 4800 rpm initially with septum bowing to the right. Speed increased in step-wise fashion to 5100 rpm. The septum bowel slightly to the right  at this speed still, but was closer to midline. Speed left at 5100 rpm. FINDINGS  Left Ventricle: Left ventricular ejection fraction, by estimation, is <20%. The left ventricle has severely decreased function. The left ventricle demonstrates global hypokinesis. The left ventricular internal cavity size was  normal in size. There is mild concentric left ventricular hypertrophy. Right Ventricle: The right ventricular size is normal. Right ventricular systolic function is moderately reduced. Left Atrium: Left atrial size was normal in size. Right Atrium: Right atrial size was normal in size. Pericardium: A small pericardial effusion is present. The pericardial effusion is circumferential. Mitral Valve: The mitral valve is normal in structure. No evidence of mitral valve stenosis. Tricuspid Valve: The tricuspid valve is normal in structure. Tricuspid valve regurgitation is not demonstrated. Aortic Valve: The aortic valve does not open. No aortic insufficiency. Additional Comments: A device lead is visualized in the right ventricle.  LEFT VENTRICLE PLAX 2D LVIDd:         4.67 cm LV IVS:        0.90 cm LVOT diam:     2.00 cm LVOT Area:     3.14 cm  RIGHT VENTRICLE RV S prime:     11.90 cm/s  SHUNTS Systemic Diam: 2.00 cm Dalton McleanMD Electronically signed by Ezra Kanner Signature Date/Time: 12/26/2024/2:10:42 PM    Final      Medications:     Scheduled Medications:  arformoterol   15 mcg Nebulization BID   Chlorhexidine  Gluconate Cloth  6 each Topical Daily   docusate sodium   100 mg Oral Daily   gabapentin   200 mg Oral QHS   hydrocortisone  sod succinate (SOLU-CORTEF ) inj  100 mg Intravenous Q12H   insulin  aspart  0-24 Units Subcutaneous Q4H   insulin  glargine  15 Units Subcutaneous Daily   lidocaine   1 patch Transdermal Q24H   methocarbamol   500 mg Oral Q8H   metoCLOPramide  (REGLAN ) injection  10 mg Intravenous Q6H   pantoprazole   40 mg Oral Daily   polyethylene glycol  17 g Oral Daily    revefenacin   175 mcg Nebulization Daily   sildenafil   20 mg Oral TID   sodium chloride  flush  3 mL Intravenous Q12H   valACYclovir   500 mg Oral Daily   Vitamin D  (Ergocalciferol )  50,000 Units Oral Q Sun   Warfarin - Pharmacist Dosing Inpatient   Does not apply q1600    Infusions:  acetaminophen  Stopped (12/28/24 0247)   amiodarone  60 mg/hr (12/28/24 0700)   clevidipine  12 mg/hr (12/28/24 0700)   epinephrine  0.5 mcg/min (12/28/24 0700)   heparin  500 Units/hr (12/28/24 0700)   magnesium  sulfate bolus IVPB 50 mL/hr at 12/28/24 0700   meropenem  (MERREM ) IV Stopped (12/28/24 0216)   milrinone  0.375 mcg/kg/min (12/28/24 0700)   potassium chloride  50 mL/hr at 12/28/24 0700   vancomycin  Stopped (12/27/24 1746)    PRN Medications: dextrose , ondansetron  (ZOFRAN ) IV, mouth rinse, oxyCODONE , sodium chloride  flush, Ubrogepant    Assessment:   POD 5 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 Normal RV. Preop LVEF 20-25%. She has CRT-D  device and Barostim.   CAD with stable coronary anatomy per recent cath.   COPD with previous smoking until 2021.   Stage 3 CKD with creat around 1.5-1.6 preop.   Preop RUQ and epigastric abdominal pain with no evidence of acute cholecystitis on US  . Seen by GI. Felt to possibly be related to low CO.   Obesity.    Acute postop respiratory failure requiring reintubation over the weekend. Appeared to be developing sepsis with fever, leukocytosis, increased lactic acid. Started on vanc and Zosyn  with quick improvement in lactic acid. Still had low grade fever all day yesterday and spiked to 101.5 last evening. Temp 99.5 this am. Zosyn  switched to  Merrem  yesterday. This could just be SIRS early postop. All lines and foley new from OR.   Acute postop blood loss anemia improved with transfusion and stable.  Plan/Discussion:    Hemodynamics were very stable yesterday and overnight with good VAD flow and Co-ox this am.   She  needs more diuresis today.  Solu-cortef  started last night thinking this is likely an inflammatory process. Leukocytosis will likely rise with this.   Keep pocket drain today.  Coumadin  2.5 mg today per pharmacy.  Probably needs Cortrack for nutrition anticipating it may be at least several more days to a week before she is taking adequate po nutrition.   I reviewed the LVAD parameters from today, and compared the results to the patient's prior recorded data.  No programming changes were made.  The LVAD is functioning within specified parameters.   Length of Stay: [address]  Dorise POUR [facility] 12/28/2024, 7:14 AM

## 2024-12-28 NOTE — Plan of Care (Signed)
  Problem: Coping: Goal: Ability to adjust to condition or change in health will improve Outcome: Progressing   Problem: Fluid Volume: Goal: Ability to maintain a balanced intake and output will improve Outcome: Progressing   Problem: Metabolic: Goal: Ability to maintain appropriate glucose levels will improve Outcome: Progressing   Problem: Skin Integrity: Goal: Risk for impaired skin integrity will decrease Outcome: Progressing   Problem: Tissue Perfusion: Goal: Adequacy of tissue perfusion will improve Outcome: Progressing

## 2024-12-28 NOTE — Progress Notes (Signed)
 Pt placed back on BiPAP 5/+5 100% NO 20ppm.

## 2024-12-28 NOTE — Progress Notes (Signed)
 PT Cancellation Note  Patient Details Name: Karen Warner MRN: [id number] DOB: May 08, 1951   Cancelled Treatment:    Reason Eval/Treat Not Completed: Patient not medically ready Pt currently on BiPap. RN stated that they are attempting to diurese to attempt to increase respiratory function. PT will continue to follow and return for treatment as schedule allows.   Kalix Meinecke B. Fleeta Lapidus PT, DPT Acute Rehabilitation Services Please use secure chat or  Call Office [phone]  [name] KATHEE Fleeta [facility] 12/28/2024, 10:43 AM

## 2024-12-28 NOTE — Progress Notes (Signed)
 Palliative:  HPI: 74 y.o. 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 Required intubation 8/12.   I met today with 3 daughters, sister, brother, and Inge Lecher NP PCCM. We had discussion regarding path forward for one way extubation. We reviewed poor prognosis and expectation that Karen Warner will not do well once extubated. Family reiterate goal that he not suffer. We reviewed option to have medication available if he is having distress vs having medication already onboard to prevent distress. Family would like to have medication onboard to minimize any suffering during transition off ventilator. They would like to move forward with extubation later today and will have the rest of the family come to visit prior to extubation.   I reviewed plans and symptom management recommendations with RN.   Update: I was present with family prior to additional support as well as throughout extubation. Family appropriately tearful. Assisted to ensure comfort. I left family to visit privately with Karen Warner after he is resting comfortably after extubation.   All questions/concerns addressed. Emotional support provided. Much time coordinating care with RN, PCCM, and family.   Exam: Sedated on vent. FiO2 50%. Breathing regular, unlabored on vent. No distress. Not following commands. Abd soft. Warm to touch.   Plan:  - DNR - Extubated to full comfort care - Anticipate hospital death  100 min  Bernarda Kitty, NP Palliative Medicine Team Pager [phone] (Please see [email or web address] for schedule) Team Phone [phone]

## 2024-12-28 NOTE — Progress Notes (Addendum)
 Patient ID: Karen Warner Karen Warner, female   DOB: 1951-05-05, 74 y.o.   MRN: [id number]     Advanced Heart Failure Rounding Note  Cardiologist: Dub Huntsman, DO  [facility] Cardiologist: Dr Rolan  Chief Complaint: Post Op HMIII LVAD  Patient Profile   Karen Warner is a 74 y.o. female with history of CAD and ischemic cardiomyopathy, EF <20%, CKD III and COPD admitted w/ a/c CHF, chest/abdominal pain. RHC c/w low output.   Significant events:   1/15: RHC (RA 2, PA 21/10, PCW 3,  PA sat 59%, TD CI 1.49, FICK CI 1.88, PAPi 5.5), started on milrinone            LHC patent stents, nonobstructive CAD   1/19: co-ox persistently in 40s, milrinone  increased 0.375 12/23/24: S/P HMIII Implant.Intra-op speed placed at 4700 due to low filling pressures.  Received 2UPRBC, 3 FFP, cellsaver, 1 albumin , and 4 FFP  12/23/24 POCUS: VAD well positioned. RV looked ok but PAPI low with high CVP 12/24/24: Extubated. Had some low flows. Cleveprex started 1/25: Re-intubated with desaturation, increased work of breathing 1/26: Ramp echo, speed increased to 5100 rpm.  1 unit PRBCs. 1/27: Extubated. Increased work of breathing, back on Bipap  Subjective:    Patient was put back on Bipap overnight, has not tolerated weaning off.  Tm 100.6 despite regular Tylenol .  She is on vancomycin /meropenem , WBCs 15 => 17.4, PCT 1.83.  CXR with retrocardiac density.   She is on epinephrine  0.5, clevidipine  12, milrinone  0.375, back on iNO 20 ppm with Bipap.  I/Os net negative 2639 with Lasix  60 mg IV bid yesterday. Weight down 2 lbs.  INR 1.4, not on warfarin yet.  On heparin  gtt.   Hypertensive at times, develops low flow alarms with hypertension and started on clevidipine  to control.  However, does not tolerate weaning off epinephrine  0.5 due to hypotension. Very labile.    She is on amiodarone  gtt at 60 mg/hr, no VT or AF.   Swan: CVP 13-14 PA 30/20 Continuous CO/CI  3.2 Co-ox 71% LDH 539 => 612  LVAD  Interrogation HM 3: Speed: 5100 Flow: 4 PI: 4.4 Power: 3.5.  2 low flow events yesterday pm with hypertensive episodes.   Objective:   Weight Range: 77.8 kg Body mass index is 28.54 kg/m.   Vital Signs:   Temp:  [98.8 F (37.1 C)-101.5 F (38.6 C)] 100 F (37.8 C) (01/28 0700) Pulse Rate:  [30-178] 119 (01/28 0700) Resp:  [16-40] 28 (01/28 0700) BP: (88-90)/(54-77) 90/77 (01/27 1850) SpO2:  [84 %-100 %] 99 % (01/28 0700) Arterial Line BP: (71-171)/(47-106) 98/69 (01/28 0700) FiO2 (%):  [40 %-100 %] 70 % (01/28 0630) Weight:  [77.8 kg] 77.8 kg (01/28 0500) Last BM Date : 12/27/24  Weight change: Filed Weights   12/26/24 0500 12/27/24 0500 12/28/24 0500  Weight: 83.3 kg 78.8 kg 77.8 kg    Intake/Output:   Intake/Output Summary (Last 24 hours) at 12/28/2024 0739 Last data filed at 12/28/2024 0700 Gross per 24 hour  Intake 2811.24 ml  Output 5450 ml  Net -2638.76 ml     Physical Exam   General: Well appearing this am. NAD.  HEENT: Normal. Neck: Supple, JVP 12 cm. Carotids OK.  Cardiac:  Mechanical heart sounds with LVAD hum present.  Lungs:  CTAB, normal effort.  Abdomen:  NT, ND, no HSM. No bruits or masses. +BS  LVAD exit site: Well-healed and incorporated. Dressing dry and intact. No erythema or drainage. Stabilization device  present and accurately applied. Driveline dressing changed daily per sterile technique. Extremities:  Warm and dry. No cyanosis, clubbing, rash, or edema.  Neuro:  Alert & oriented x 3. Cranial nerves grossly intact. Moves all 4 extremities w/o difficulty. Affect pleasant     Telemetry   Sinus tachy 110s with BiV-pacing. Personally reviewed  Labs   CBC Recent Labs    12/27/24 0519 12/27/24 0642 12/28/24 0435 12/28/24 0534  WBC 15.2*  --  17.4*  --   NEUTROABS 11.2*  --  13.9*  --   HGB 8.9*   < > 9.5* 9.9*  HCT 27.5*   < > 28.5* 29.0*  MCV 92.9  --  90.2  --   PLT 210  --  248  --    < > = values in this interval not displayed.    Basic Metabolic Panel Recent Labs    98/72/73 0519 12/27/24 0642 12/27/24 1855 12/27/24 2014 12/28/24 0435 12/28/24 0534  NA  --    < > 138   < > 138 141  K  --    < > 3.0*   < > 3.6 3.2*  CL  --    < > 103  --  102  --   CO2  --    < > 20*  --  23  --   GLUCOSE  --    < > 263*  --  234*  --   BUN  --    < > 17  --  15  --   CREATININE  --    < > 0.94  --  1.00  --   CALCIUM   --    < > 8.1*  --  8.6*  --   MG 2.3  --   --   --  1.8  --   PHOS 1.8*  --   --   --  4.0  --    < > = values in this interval not displayed.   Liver Function Tests Recent Labs    12/26/24 0454  AST 77*  ALT 8  ALKPHOS 68  BILITOT 0.6  PROT 5.6*  ALBUMIN  3.4*   No results for input(s): LIPASE, AMYLASE in the last 72 hours. Cardiac Enzymes No results for input(s): CKTOTAL, CKMB, CKMBINDEX, TROPONINI in the last 72 hours.  BNP: BNP (last 3 results) Recent Labs    09/01/24 1457  BNP 169.4*    ProBNP (last 3 results) Recent Labs    12/13/24 1529 12/14/24 0826 12/24/24 0406  PROBNP 1,342.0* 1,539.0* 3,345.0*     D-Dimer No results for input(s): DDIMER in the last 72 hours.  Hemoglobin A1C No results for input(s): HGBA1C in the last 72 hours. Fasting Lipid Panel No results for input(s): CHOL, HDL, LDLCALC, TRIG, CHOLHDL, LDLDIRECT in the last 72 hours. Medications:   Scheduled Medications:  arformoterol   15 mcg Nebulization BID   Chlorhexidine  Gluconate Cloth  6 each Topical Daily   docusate sodium   100 mg Oral Daily   furosemide   80 mg Intravenous Once   gabapentin   200 mg Oral QHS   hydrocortisone  sod succinate (SOLU-CORTEF ) inj  100 mg Intravenous Q12H   insulin  aspart  0-24 Units Subcutaneous Q4H   insulin  glargine  15 Units Subcutaneous Daily   lidocaine   1 patch Transdermal Q24H   methocarbamol   500 mg Oral Q8H   metoCLOPramide  (REGLAN ) injection  10 mg Intravenous Q6H   pantoprazole   40 mg Oral Daily   polyethylene  glycol  17 g Oral  Daily   revefenacin   175 mcg Nebulization Daily   sildenafil   20 mg Oral TID   sodium chloride  flush  3 mL Intravenous Q12H   valACYclovir   500 mg Oral Daily   Vitamin D  (Ergocalciferol )  50,000 Units Oral Q Sun   Warfarin - Pharmacist Dosing Inpatient   Does not apply q1600    Infusions:  acetaminophen  Stopped (12/28/24 0247)   amiodarone  60 mg/hr (12/28/24 0700)   clevidipine  12 mg/hr (12/28/24 0700)   epinephrine  0.5 mcg/min (12/28/24 0700)   furosemide  (LASIX ) 200 mg in dextrose  5 % 100 mL (2 mg/mL) infusion     heparin  500 Units/hr (12/28/24 0700)   meropenem  (MERREM ) IV Stopped (12/28/24 0216)   milrinone  0.375 mcg/kg/min (12/28/24 0700)   potassium chloride  10 mEq (12/28/24 0727)   potassium chloride      vancomycin  Stopped (12/27/24 1746)    PRN Medications: dextrose , ondansetron  (ZOFRAN ) IV, mouth rinse, oxyCODONE , sodium chloride  flush, Ubrogepant      Assessment/Plan   1. 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  - Intra Op Speed 4600 due to low filling pressures.  - Ramp echo 1/26: RV small, moderate dysfunction; interventricular septum bows to the right; the aortic valve does not open. Speed increased from 4800 step-wise to 5100.  Flow increased 3.0 L/min => 3.7 L/min, no ectopy.  LVIDD decreased appropriately.  At 5100 rpm, the IV septum still had some rightward shift but less than initially and the RV appeared to fill more.  - Currently on milrinone  0.375 + epinephrine  0.5 + iNO 20 ppm.  MAP too low when sedated if epinephrine  dropped completely. Also on clevidipine  12 to prevent hypertensive episodes with sedation weaning (has low flow alarms with hypertension).  BP has been very labile.  - Wean iNO and start sildenafil  20 tid when she is off Bipap.  - Co-ox 71% with CI 3 by continuous cardiac output Swan but suspect RV failure with low PAPi.  RV moderately dysfunctional but not enlarged by echo.   - CVP 13-14, good  diuresis yesterday but still about 8 lbs above pre-op.  Lasix  80 mg IV x 1 today then Lasix  gtt 10 mg/hr.   - INR 1.4, on heparin  gtt and will give warfarin when not on Bipap.   2.  Acute on chronic systolic CHF: Ischemic cardiomyopathy.  [employer] CRT-D device  - She is end-stage NYHA IV.  - s/p HM-3 VAD - management as above  3. Acute hypoxic respiratory failure - reintubated 1/25, extubated 1/27, had to go back on Bipap last night due to increased work of breathing and remains on Bipap.  - Suspect PNA, persistently febrile despite regular Tylenol  with WBCs 17.  Now on vancomycin /meropenem .  - PCT 1.83 yesterday, recheck today.  - Given fever of uncertain etiology, she was started on steroids as well yesterday, hydrocortisone  100 q12.  - Diurese as above, hopefully can get off Bipap today.   4. CAD: PCI to mid/distal LAD in 2020 and proximal LAD in 2021.   Cath this admit showed stable coronary anatomy. Suspect CP related to HF as above - Off ranolazine , imdur , plavix  - Restart Crestor  + Zetia  eventually.   5. COPD: History of smoking, quit 2021.    6. CKD 3: Follows with nephology at [facility].  - Creatinine 1.15 => 1.34 => 1.0   7. Obesity: Has been on semaglutide. Now on hold.   8.  PAD: Moderately decreased ABI on right in 7/25. Medical mgmt per Dr. Serene. Again ABI on 1/19, the R had moderate disease, and mild on the L. No claudication or other PAD symptoms.   9. Anemia: Post-op, 1 unit PRBCs 1/25.  Hgb 9.5 today.   - Transfuse Hgb < 8.   10. FEN: TFs ongoing.   11. Rhythm: NSR on amiodarone  gtt 60 mg/hr.  - Decrease amiodarone  to 30 mg/hr.    CRITICAL CARE Performed by: Ezra Shuck  Total critical care time: 45 minutes  Critical care time was exclusive of separately billable procedures and treating other patients.  Critical care was necessary to treat or prevent imminent or life-threatening deterioration.  Critical care was time spent personally by me on  the following activities: development of treatment plan with patient and/or surrogate as well as nursing, discussions with consultants, evaluation of patient's response to treatment, examination of patient, obtaining history from patient or surrogate, ordering and performing treatments and interventions, ordering and review of laboratory studies, ordering and review of radiographic studies, pulse oximetry and re-evaluation of patient's condition.   Length of Stay: 83  Ezra Shuck, MD  12/28/2024, 7:39 AM  Advanced Heart Failure Team Pager [phone] (M-F; 7a - 5p)   Please visit [email or web address]: For overnight coverage please call cardiology fellow first. If fellow not available call Shock/ECMO MD on call.  For ECMO / Mechanical Support (Impella, IABP, LVAD) issues call Shock / ECMO MD on call.

## 2024-12-28 NOTE — Progress Notes (Signed)
 Nutrition Follow-up  DOCUMENTATION CODES:   Not applicable  INTERVENTION:   Tube Feeding via Cortrak: Trickle only today Pivot 1.5 at 20 ml/hr Goal: Pivot 1.5 at 50 ml/hr TF at goal provides 113 g of protein, 1800 kcals and 912 mL of free water  NUTRITION DIAGNOSIS:   Increased nutrient needs related to chronic illness as evidenced by estimated needs.  Continues but being addressed   GOAL:   Patient will meet greater than or equal to 90% of their needs  Progressing  MONITOR:   PO intake, Supplement acceptance, Labs, I & O's  REASON FOR ASSESSMENT:   Consult LVAD Eval  ASSESSMENT:   74 y.o. female present to the ED with chest pain. PMH includes GERD, CHF, T2DM, HLD, CAD, COPD, HTN, Glaucoma, diverticulitis, s/p pacemaker, [place] stimulator, fibromyalgia, and CKD III. Pt admitted with chest pain, acute on chronic HF, and AKI on CKD.  1/15 Admitted  1/23 HM3 LVAD implantation 1/24 Extubated, Cleviprex  started 1/25 Re-Intubated, pressors, trickle TF started 1/26 Remains on trickle TF 1/27 Extubated, later on BiPAP, iNO restarted  Off BiPAP on visit today, currently on HFNC. Cortrak placed. Remains on iNO  Currently on lasix  gtt Epinephrine  at 0.5, milrinone  0.375  CL diet ordered but has not taken much given on/off BiPAP  Pt with minimal nutrition since LVAD on 1/23  Labs: CBGs 178-189 Sodium 140 (wdl) Creatinine 1.09 BUN wdl Potassium 3.7 (wdl) Phosphorus 2.4 (L)  Meds: SS novolog  Novolog  q 4 hours Miralax  Colace Protonix  KCl Vit D Coumadin    Diet Order:   Diet Order             Diet clear liquid Room service appropriate? Yes; Fluid consistency: Thin  Diet effective now                   EDUCATION NEEDS:   Education needs have been addressed  Skin:  Skin Assessment: Skin Integrity Issues: Skin Integrity Issues:: Incisions Incisions: Driveline- new LVAD on 12/23/24  Last BM:  1/28  Height:   Ht Readings from Last 1  Encounters:  12/23/24 5' 5 (1.651 m)    Weight:   Wt Readings from Last 1 Encounters:  12/28/24 77.8 kg    Ideal Body Weight:  56.8 kg  BMI:  Body mass index is 28.54 kg/m.  Estimated Nutritional Needs:   Kcal:  1800-2000  Protein:  90-115 g  Fluid:  1.8 L  Betsey Finger MS, RDN, LDN, CNSC Registered Dietitian 3 Clinical Nutrition RD Inpatient Contact Info in Amion

## 2024-12-28 NOTE — Progress Notes (Signed)
 EVENING ROUNDS NOTE :     [address] [place] [employer] [address]             [phone]               5 Days Post-Op Procedures (LRB): INSERTION OF IMPLANTABLE LEFT VENTRICULAR ASSIST DEVICE HEARTMATE 3 (N/A) ECHOCARDIOGRAM, TRANSESOPHAGEAL, INTRAOPERATIVE (N/A)   Total Length of Stay:  LOS: 13 days  Events:   No events     BP 102/70   Pulse (!) 122   Temp 99.1 F (37.3 C)   Resp (!) 24   Ht 5' 5 (1.651 m)   Wt 77.8 kg Comment: controller in bed  SpO2 99%   BMI 28.54 kg/m   PAP: (20-48)/(9-32) 31/19 CVP:  [2 mmHg-40 mmHg] 9 mmHg CO:  [4.8 L/min-6.1 L/min] 5.8 L/min CI:  [2.6 L/min/m2-3.4 L/min/m2] 3.2 L/min/m2  Vent Mode: PSV;BIPAP FiO2 (%):  [70 %-100 %] 70 % PEEP:  [5 cmH20] 5 cmH20 Pressure Support:  [5 cmH20-8 cmH20] 8 cmH20   acetaminophen      amiodarone  30 mg/hr (12/28/24 1612)   epinephrine  0.5 mcg/min (12/28/24 1600)   furosemide  (LASIX ) 200 mg in dextrose  5 % 100 mL (2 mg/mL) infusion 10 mg/hr (12/28/24 1600)   heparin  500 Units/hr (12/28/24 1600)   meropenem  (MERREM ) IV Stopped (12/28/24 1004)   milrinone  0.375 mcg/kg/min (12/28/24 1610)   nitroGLYCERIN  30 mcg/min (12/28/24 1600)   vancomycin  Stopped (12/27/24 1746)    I/O last 3 completed shifts: In: 3827.3 [I.V.:2050.1; NG/GT:362; IV Piggyback:1415.1] Out: 6390 [Urine:6190; Emesis/NG output:80; Chest Tube:120]      Latest Ref Rng & Units 12/28/2024    3:55 PM 12/28/2024   12:38 PM 12/28/2024    5:34 AM  CBC  Hemoglobin 12.0 - 15.0 g/dL 9.5  9.9  9.9   Hematocrit 36.0 - 46.0 % 28.0  29.0  29.0        Latest Ref Rng & Units 12/28/2024    3:55 PM 12/28/2024   12:38 PM 12/28/2024    5:34 AM  BMP  Sodium 135 - 145 mmol/L 141  140  141   Potassium 3.5 - 5.1 mmol/L 3.5  3.5  3.2     ABG    Component Value Date/Time   PHART 7.504 (H) 12/28/2024 1555   PCO2ART 27.9 (L) 12/28/2024 1555   PO2ART 96 12/28/2024 1555   HCO3 21.9 12/28/2024 1555   TCO2 23 12/28/2024 1555    ACIDBASEDEF 1.0 12/28/2024 0238   O2SAT 98 12/28/2024 1555       Daylin Gruszka, MD 12/28/2024 4:53 PM

## 2024-12-28 NOTE — Progress Notes (Signed)
 Pt transferred from BiPAP 100% 8/5 NO 20ppm to 10L Salter NO 20ppm.

## 2024-12-28 NOTE — Progress Notes (Signed)
 "  NAME:  Jakeline Dave, MRN:  [id number], DOB:  May 31, 1951, LOS: 13 ADMISSION DATE:  12/14/2024, CONSULTATION DATE:  1/23 REFERRING MD:  Lucas, CHIEF COMPLAINT:  post-LVAD   History of Present Illness:  [name] is a 74 y/o woman with a history of ICM and HFrEF who presented on 1/14 with chest pain and decompensated heart failure. She underwent left and right heart catheterization this admission showing low CO and low filling pressures.  She was managed with inotropic support. Due to progression of disease and poor tolerance of GDMT, she was evaluated for LVAD.  Intraoperatively her course was uncomplicated other than low flows on LVAD when her BP went up at the end of the case when she was stimulated. She transferred to the ICU on epi 2, NE 1, milrinone  0.330mcg. TEE with normal RV function, evidence of intrapulmonary shunt.  Paralytics not reversed in ICU. Bypass time , crossclamp time 29 min, cell saver 225cc. LVAD speed 4600 RPM. Alarms on LVAD went off during periods of hypertension post-op, controlled by afterload reduction.   Pertinent  Medical History  HFrEF due to ICM DM2 IDA FM CKD3  Significant Hospital Events: Including procedures, antibiotic start and stop dates in addition to other pertinent events   1/14 admission 1/15 L&R heart cath> nonobstructive disease, low filling pressures 1/23 LVAD 1/24 extubated; fluid boluses & milrinone  increased due to frequent LVAD alarms with high PI 1/25 reintubated for respiratory distress, worsening lactic acidosis 1/26 Tmax 100.4 on vanc and zosyn , remains intubated  1/27 extubated, remains with labile blood pressure, worsening fever/tachycardia and hypoxia requiring BiPap and resumption of inhaled nitric, cultured and broadened to vanc and meropenem , some low flows associated with hypertension   Interim History / Subjective:  Tachypneic off BiPap overnight. Required ativan  for anxiety. This morning complains of anxiety and  claustrophobia associated with BiPap mask. Also with left sided chest pain.   Objective    Blood pressure 102/70, pulse (!) 117, temperature 100 F (37.8 C), resp. rate (!) 30, height 5' 5 (1.651 m), weight 77.8 kg, SpO2 99%. PAP: (2-48)/(-3-32) 29/19 CVP:  [3 mmHg-40 mmHg] 10 mmHg CO:  [4.5 L/min-6.1 L/min] 5.8 L/min CI:  [2.5 L/min/m2-3.4 L/min/m2] 3.2 L/min/m2  Vent Mode: PSV;BIPAP FiO2 (%):  [70 %-100 %] 70 % PEEP:  [5 cmH20] 5 cmH20 Pressure Support:  [5 cmH20-8 cmH20] 8 cmH20   Intake/Output Summary (Last 24 hours) at 12/28/2024 1016 Last data filed at 12/28/2024 1000 Gross per 24 hour  Intake 3074.82 ml  Output 4470 ml  Net -1395.18 ml   Filed Weights   12/26/24 0500 12/27/24 0500 12/28/24 0500  Weight: 83.3 kg 78.8 kg 77.8 kg   Examination: General: acutely ill appearing, anxious appearing female  HENT: [place]/AT, sclera anicteric, on BiPap Lungs: uncomfortable on BiPap, tachypneic, slight belly breathing, diminished bilateral bases  Cardiovascular: sinus tachycardia, normal VAD hum  Abdomen: soft, non-tender, non-distended, +BS Extremities: warm, dry, trace edema  Neuro: on BiPap, nods appropriately, follows commands throughout, no focal deficits  GU: foley draining clear yellow urine   I/O: -2.6L/24 hours,m +6.1L/hospital course  CXR (personally read): stable support devices, blunting of right CPA likely atelectasis, LVAD obscures left lung base   Resolved problem list   Assessment and Plan   Acute on chronic HFrEF due to ICM; NYHA class IV heart failure s/p LVAD on 1/23 -post-op care per TCTS -continue CCO swan -inotropes per AHF- milrinone  0.375, epi 0.5 -trial switching from clev to PRN hydralazine   and nitroglycerin  to see if contributing to intrapulmonary shunt given hypoxia is out of proportion with CXR findings; MAP goal 70-90  -avoiding dex due to fever  -continue iNO, resume sildenafil  when enteral access establish and would consider weaning  -continue  heparin  and warfarin, INR today 1.4  -diuresis with 80 mg IV Lasix  and start gtt 10 mg/hr   Acute respiratory failure with hypoxia; developed increased WOB in the setting of fever and tachycardia yesterday evening requiring BiPap and resumption of inhaled nitric oxide. Query if clevidipine  contributing to intra-pulmonary shunt given degree of hypoxia appears out of proportion with CXR findings  -continue Bipap PRN, will trial weaning to Salter which can also administer iNO -continue brovana  & yupelri  -diuresis per above -push IS, mobility  -change clev to PRN hydral and nitroglycerin  per above  -continue broad spectrum antibiotics   Sinus tachycardia, even at rates of 130-160 appeared to have P waves on tele, ECG difficult to interpret given VAD artifact; likely related to fever -continue amiodarone  for now  Fevers, leukocytosis; remains unclear if post-op inflammation versus infectious process. WBC increased today likely in the setting of steroids  -continue to monitor CBC  -broadened to meropenem  from zosyn  1/27 -continue vancomycin  -follow-up blood cultures -continue hydrocortisone  100 mg q12h  -avoiding dex   Hyperglycemia; h/o DM. A1c 6.6 -con't glargine to 15 units daily -SSI PRN; keep q4h for now -stopped tube feed coverage while NPO given respiratory status -goal BG 140-180  Anticipated acute blood loss anemia -continue to monitor   GERD -IV PPI; switch to PO when able   H/o migraine -holding home PTA Ubrelvy ; avoiding polypharmacy given fever of unknown origin   H/o FM -holding home PTA trintellix  -optimize mobility and sleep   Acute post-operative pain Anxiety; associated with BiPap mask -PRN Ativan , avoiding dex given fevers -scheduled IV APAP, lidocaine  patch, gabapentin  -stop robaxin  given above -PRN fentanyl  and oxycodone   CKD 3b -strict I/O -renally dose meds, avoid nephrotoxic meds -encourage oral intake of free water when able to swallow safely   -diuresis per above  At risk for malnutrition  -plan for Cortrak placement today -will need RD consult   Plan discussed with TCTS and advanced hear failure.  Labs   CBC: Recent Labs  Lab 12/24/24 0406 12/24/24 0523 12/24/24 1700 12/24/24 1822 12/25/24 0414 12/25/24 0515 12/26/24 0454 12/26/24 0455 12/27/24 0519 12/27/24 9357 12/27/24 1848 12/27/24 2014 12/28/24 0238 12/28/24 0435 12/28/24 0534  WBC 17.6*  --  18.5*  --  23.6*  --  21.0*  --  15.2*  --   --   --   --  17.4*  --   NEUTROABS 13.6*  --   --   --  19.9*  --  17.1*  --  11.2*  --   --   --   --  13.9*  --   HGB 9.1*   < > 9.4*   < > 8.5*   < > 7.6*   < > 8.9*   < > 9.9* 10.5* 9.9* 9.5* 9.9*  HCT 28.3*   < > 28.3*   < > 25.7*   < > 22.7*   < > 27.5*   < > 29.0* 31.0* 29.0* 28.5* 29.0*  MCV 94.3  --  95.6  --  96.3  --  94.2  --  92.9  --   --   --   --  90.2  --   PLT 202  --  198  --  182  --  184  --  210  --   --   --   --  248  --    < > = values in this interval not displayed.    Basic Metabolic Panel: Recent Labs  Lab 12/24/24 0406 12/24/24 0523 12/25/24 0414 12/25/24 0515 12/25/24 2051 12/26/24 0454 12/26/24 0455 12/27/24 0519 12/27/24 9357 12/27/24 0734 12/27/24 1848 12/27/24 1855 12/27/24 2014 12/28/24 0238 12/28/24 0435 12/28/24 0534  NA 140   < > 136   < > 138 138   < >  --    < > 143   < > 138 140 141 138 141  K 4.9   < > 4.3   < > 3.9 3.6   < >  --    < > 4.0   < > 3.0* 3.6 3.4* 3.6 3.2*  CL 108   < > 106  --  107 108  --   --   --  112*  --  103  --   --  102  --   CO2 19*   < > 19*  --  19* 19*  --   --   --  21*  --  20*  --   --  23  --   GLUCOSE 180*   < > 181*  --  195* 231*  --   --   --  168*  --  263*  --   --  234*  --   BUN 12   < > 11  --  19 23  --   --   --  19  --  17  --   --  15  --   CREATININE 1.52*   < > 1.15*  --  1.38* 1.34*  --   --   --  1.03*  --  0.94  --   --  1.00  --   CALCIUM  8.5*   < > 8.4*  --  8.1* 8.2*  --   --   --  8.3*  --  8.1*  --   --  8.6*   --   MG 2.8*   < > 2.4  --  2.6* 2.7*  --  2.3  --   --   --   --   --   --  1.8  --   PHOS 2.9  --  2.0*  --   --  2.1*  --  1.8*  --   --   --   --   --   --  4.0  --    < > = values in this interval not displayed.   GFR: Estimated Creatinine Clearance: 50.9 mL/min (by C-G formula based on SCr of 1 mg/dL). Recent Labs  Lab 12/25/24 0044 12/25/24 0414 12/25/24 1051 12/25/24 1344 12/26/24 0454 12/27/24 0519 12/27/24 1855 12/28/24 0435 12/28/24 0810  PROCALCITON  --   --   --   --   --   --  1.83  --  2.48  WBC  --  23.6*  --   --  21.0* 15.2*  --  17.4*  --   LATICACIDVEN 0.9  --  3.9* 1.7  --   --   --   --   --     Critical care time:     The patient is critically ill with multiple organ system failure and requires high complexity decision making for assessment and support, frequent  evaluation and titration of therapies, advanced monitoring, review of radiographic studies and interpretation of complex data.   Critical Care Time devoted to patient care services, exclusive of separately billable procedures, described in this note is 60 minutes.  Rexene LOISE Blush, PA-C 12/28/24 10:16 AM  Pulmonary & Critical Care  For contact information, see Amion.  After hours, 7PM- 7AM, please call on call APP for 2H.        "

## 2024-12-28 NOTE — Progress Notes (Addendum)
 LVAD Coordinator Rounding Note:  Admitted 12/14/24 due to CHF exacerbation. Underwent LVAD evaluation.   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  1/23: S/P HMIII Implant.Intra-op speed placed at 4600 due to low filling pressures.   1/24: Extubated. Had some low flows. Cleveprex started. 1/25: Re-intubated with desaturation, increased work of breathing 1/26: Remains intubated.Cleveprex stopped.   Pt placed on Bipap overnight. NO restarted. Pt's daughter Mercy at bedside. No low flows overnight.   Daughter Angie observed dressing change today at bedside.   Vital signs: Temp: 99.9 HR: 115 Doppler Pressure: 80 Arterial BP: 92/66 (75) O2 Sat: 98% on  Wt: 183.6>173.7>171.5 lbs    LVAD interrogation reveals:  Speed:5100 Flow: 3.8 Power: 3.4 w PI: 6.1   Alarms: no alarms today; multiple low flows yesterday Events:  none  Hematocrit: 23 Fixed speed: 5100 Low speed limit: 4800   Drive Line: Existing VAD dressing removed and site care performed using sterile technique. Drive line exit site cleaned with Chlora prep applicators x 2, allowed to dry, and Silverlon patch with gauze dressing applied. Exit site unincorporated, the velour is fully implanted at exit site. Small amount of sanguinous drainage noted on previous dressing. No redness, tenderness, foul odor or rash noted. Drive line anchor re-applied. Continue daily dressing changes by VAD coordinator or nurse champion. Next dressing change due 12/29/24.       Labs:  LDH trend: 464>529>535>539>612  INR trend: 1.3>1.5>1.6>1.3>1.4  WBC trend: 18.5>23.6>21.0>15.2>17.4  Hgb trend: 9.2>8.5>7.6>8.9>9.5  Anticoagulation Plan: - INR Goal: 2.0 - 2.5 - ASA Dose: none - Heparin  gtt per pharmacy - Coumadin  dosing per pharmacy  Blood Products:  - Intra-op 1/23:   2 PRBC  4 FFP  225 cc cell saver - Post op:  1/26: 1 PRBC  Device: - [employer] -Therapies: OFF  Arrythmias:    Respiratory: Extubated 1/24. Reintubated 1/25 due to respiratory distress. Nitric weaned off and extuabted 1/27.  Infection:   Renal:  -BUN/CRT: 10/1.3>11/1.15>23/1.34>19/1.03>15/1  Adverse Events on VAD:  Drips:  Epinephrine  0.5 mcg/min Milrinone  0.375 mcg/kg/min Cleviprex  12 mg/hr>>> to switch to Nitro Heparin  500 units/hr Amiodarone  30mg /hr Lasix  10 mg/hr  Patient Education:  Pt's daughter in law Kim observed dressing change today at bedside  Plan/Recommendations:  1. Page VAD coordinator with any drive line or equipment concerns 2. Daily drive line dressing changes per VAD coordinator or nurse champion  Schuyler Lunger RN, BSN VAD Coordinator 24/7 Pager [phone]

## 2024-12-29 ENCOUNTER — Other Ambulatory Visit: Payer: Self-pay

## 2024-12-29 DIAGNOSIS — I130 Hypertensive heart and chronic kidney disease with heart failure and stage 1 through stage 4 chronic kidney disease, or unspecified chronic kidney disease: Secondary | ICD-10-CM | POA: Diagnosis not present

## 2024-12-29 DIAGNOSIS — D72829 Elevated white blood cell count, unspecified: Secondary | ICD-10-CM | POA: Diagnosis not present

## 2024-12-29 DIAGNOSIS — I5023 Acute on chronic systolic (congestive) heart failure: Secondary | ICD-10-CM | POA: Diagnosis not present

## 2024-12-29 DIAGNOSIS — E1122 Type 2 diabetes mellitus with diabetic chronic kidney disease: Secondary | ICD-10-CM | POA: Diagnosis not present

## 2024-12-29 DIAGNOSIS — I255 Ischemic cardiomyopathy: Secondary | ICD-10-CM | POA: Diagnosis not present

## 2024-12-29 DIAGNOSIS — D62 Acute posthemorrhagic anemia: Secondary | ICD-10-CM

## 2024-12-29 DIAGNOSIS — G8918 Other acute postprocedural pain: Secondary | ICD-10-CM | POA: Diagnosis not present

## 2024-12-29 DIAGNOSIS — E1165 Type 2 diabetes mellitus with hyperglycemia: Secondary | ICD-10-CM

## 2024-12-29 DIAGNOSIS — Z8669 Personal history of other diseases of the nervous system and sense organs: Secondary | ICD-10-CM

## 2024-12-29 DIAGNOSIS — J9601 Acute respiratory failure with hypoxia: Secondary | ICD-10-CM | POA: Diagnosis not present

## 2024-12-29 DIAGNOSIS — K219 Gastro-esophageal reflux disease without esophagitis: Secondary | ICD-10-CM | POA: Diagnosis not present

## 2024-12-29 DIAGNOSIS — N1832 Chronic kidney disease, stage 3b: Secondary | ICD-10-CM | POA: Diagnosis not present

## 2024-12-29 LAB — POCT I-STAT 7, (LYTES, BLD GAS, ICA,H+H)
Acid-base deficit: 1 mmol/L (ref 0.0–2.0)
Bicarbonate: 22.3 mmol/L (ref 20.0–28.0)
Calcium, Ion: 1.19 mmol/L (ref 1.15–1.40)
HCT: 27 % — ABNORMAL LOW (ref 36.0–46.0)
Hemoglobin: 9.2 g/dL — ABNORMAL LOW (ref 12.0–15.0)
O2 Saturation: 98 %
Patient temperature: 37.2
Potassium: 3.8 mmol/L (ref 3.5–5.1)
Sodium: 141 mmol/L (ref 135–145)
TCO2: 23 mmol/L (ref 22–32)
pCO2 arterial: 31.3 mmHg — ABNORMAL LOW (ref 32–48)
pH, Arterial: 7.461 — ABNORMAL HIGH (ref 7.35–7.45)
pO2, Arterial: 99 mmHg (ref 83–108)

## 2024-12-29 LAB — CBC WITH DIFFERENTIAL/PLATELET
Abs Immature Granulocytes: 0.16 10*3/uL — ABNORMAL HIGH (ref 0.00–0.07)
Basophils Absolute: 0 10*3/uL (ref 0.0–0.1)
Basophils Relative: 0 %
Eosinophils Absolute: 0 10*3/uL (ref 0.0–0.5)
Eosinophils Relative: 0 %
HCT: 26.5 % — ABNORMAL LOW (ref 36.0–46.0)
Hemoglobin: 8.7 g/dL — ABNORMAL LOW (ref 12.0–15.0)
Immature Granulocytes: 1 %
Lymphocytes Relative: 9 %
Lymphs Abs: 1.5 10*3/uL (ref 0.7–4.0)
MCH: 29.9 pg (ref 26.0–34.0)
MCHC: 32.8 g/dL (ref 30.0–36.0)
MCV: 91.1 fL (ref 80.0–100.0)
Monocytes Absolute: 2.1 10*3/uL — ABNORMAL HIGH (ref 0.1–1.0)
Monocytes Relative: 13 %
Neutro Abs: 11.9 10*3/uL — ABNORMAL HIGH (ref 1.7–7.7)
Neutrophils Relative %: 77 %
Platelets: 308 10*3/uL (ref 150–400)
RBC: 2.91 MIL/uL — ABNORMAL LOW (ref 3.87–5.11)
RDW: 17.4 % — ABNORMAL HIGH (ref 11.5–15.5)
WBC: 15.6 10*3/uL — ABNORMAL HIGH (ref 4.0–10.5)
nRBC: 1.2 % — ABNORMAL HIGH (ref 0.0–0.2)

## 2024-12-29 LAB — GLUCOSE, CAPILLARY
Glucose-Capillary: 232 mg/dL — ABNORMAL HIGH (ref 70–99)
Glucose-Capillary: 259 mg/dL — ABNORMAL HIGH (ref 70–99)
Glucose-Capillary: 294 mg/dL — ABNORMAL HIGH (ref 70–99)
Glucose-Capillary: 301 mg/dL — ABNORMAL HIGH (ref 70–99)
Glucose-Capillary: 244 mg/dL — ABNORMAL HIGH (ref 70–99)
Glucose-Capillary: 254 mg/dL — ABNORMAL HIGH (ref 70–99)
Glucose-Capillary: 229 mg/dL — ABNORMAL HIGH (ref 70–99)
Glucose-Capillary: 225 mg/dL — ABNORMAL HIGH (ref 70–99)
Glucose-Capillary: 204 mg/dL — ABNORMAL HIGH (ref 70–99)
Glucose-Capillary: 176 mg/dL — ABNORMAL HIGH (ref 70–99)
Glucose-Capillary: 179 mg/dL — ABNORMAL HIGH (ref 70–99)
Glucose-Capillary: 148 mg/dL — ABNORMAL HIGH (ref 70–99)
Glucose-Capillary: 167 mg/dL — ABNORMAL HIGH (ref 70–99)

## 2024-12-29 LAB — LACTATE DEHYDROGENASE: LDH: 672 U/L — ABNORMAL HIGH (ref 105–235)

## 2024-12-29 LAB — BASIC METABOLIC PANEL WITH GFR
Anion gap: 11 (ref 5–15)
BUN: 22 mg/dL (ref 8–23)
CO2: 23 mmol/L (ref 22–32)
Calcium: 8.5 mg/dL — ABNORMAL LOW (ref 8.9–10.3)
Chloride: 104 mmol/L (ref 98–111)
Creatinine, Ser: 1.22 mg/dL — ABNORMAL HIGH (ref 0.44–1.00)
GFR, Estimated: 46 mL/min — ABNORMAL LOW
Glucose, Bld: 254 mg/dL — ABNORMAL HIGH (ref 70–99)
Potassium: 4.3 mmol/L (ref 3.5–5.1)
Sodium: 138 mmol/L (ref 135–145)

## 2024-12-29 LAB — PROTIME-INR
INR: 1.6 — ABNORMAL HIGH (ref 0.8–1.2)
Prothrombin Time: 19.6 s — ABNORMAL HIGH (ref 11.4–15.2)

## 2024-12-29 LAB — PHOSPHORUS: Phosphorus: 2.2 mg/dL — ABNORMAL LOW (ref 2.5–4.6)

## 2024-12-29 LAB — MAGNESIUM: Magnesium: 2.3 mg/dL (ref 1.7–2.4)

## 2024-12-29 LAB — COOXEMETRY PANEL
Carboxyhemoglobin: 1.7 % — ABNORMAL HIGH (ref 0.5–1.5)
Methemoglobin: <0.7 % (ref 0.0–1.5)
O2 Saturation: 69 %
Total hemoglobin: 8.8 g/dL — ABNORMAL LOW (ref 12.0–16.0)

## 2024-12-29 LAB — HEPARIN LEVEL (UNFRACTIONATED): Heparin Unfractionated: <0.1 [IU]/mL — ABNORMAL LOW (ref 0.30–0.70)

## 2024-12-29 MED ORDER — SODIUM CHLORIDE 0.9% FLUSH: 10.0000 mL | INTRAVENOUS | Status: AC | PRN

## 2024-12-29 MED ORDER — K PHOS MONO-SOD PHOS DI & MONO 155-852-130 MG PO TABS
500.0000 mg | ORAL_TABLET | ORAL | Status: DC
  Filled 2024-12-29 (×2): qty 2

## 2024-12-29 MED ORDER — OXYCODONE HCL 5 MG PO TABS
2.5000 mg | ORAL_TABLET | ORAL | Status: DC | PRN
  Administered 2024-12-29 – 2025-01-03 (×9): 2.5 mg
  Filled 2024-12-29 (×10): qty 1

## 2024-12-29 MED ORDER — WARFARIN SODIUM 3 MG PO TABS
3.0000 mg | ORAL_TABLET | Freq: Once | ORAL | Status: DC
  Filled 2024-12-29: qty 1

## 2024-12-29 MED ORDER — SODIUM CHLORIDE 0.9% FLUSH
10.0000 mL | Freq: Two times a day (BID) | INTRAVENOUS | Status: AC
  Administered 2024-12-29: 10 mL
  Administered 2024-12-29: 30 mL
  Administered 2024-12-30 – 2025-01-01 (×3): 10 mL
  Administered 2025-01-01: 30 mL
  Administered 2025-01-02 (×2): 10 mL
  Administered 2025-01-03: 40 mL
  Administered 2025-01-03 – 2025-01-04 (×2): 10 mL
  Administered 2025-01-04 – 2025-01-05 (×2): 20 mL
  Administered 2025-01-06: 10 mL
  Administered 2025-01-06: 20 mL

## 2024-12-29 MED ORDER — INSULIN ASPART 100 UNIT/ML IJ SOLN
6.0000 [IU] | INTRAMUSCULAR | Status: DC
  Administered 2024-12-29: 6 [IU] via SUBCUTANEOUS
  Filled 2024-12-29: qty 6

## 2024-12-29 MED ORDER — POTASSIUM & SODIUM PHOSPHATES 280-160-250 MG PO PACK
2.0000 | PACK | ORAL | Status: AC
  Administered 2024-12-29 (×2): 2
  Filled 2024-12-29 (×4): qty 2

## 2024-12-29 MED ORDER — METOLAZONE 2.5 MG PO TABS: 2.5000 mg | ORAL_TABLET | Freq: Once | ORAL | Status: DC

## 2024-12-29 MED ORDER — SILDENAFIL CITRATE 20 MG PO TABS
20.0000 mg | ORAL_TABLET | Freq: Three times a day (TID) | ORAL | Status: DC
  Administered 2024-12-29 – 2025-01-02 (×12): 20 mg
  Filled 2024-12-29 (×14): qty 1

## 2024-12-29 MED ORDER — PIVOT 1.5 CAL PO LIQD
1000.0000 mL | ORAL | Status: DC
  Administered 2024-12-29 (×2): 1000 mL

## 2024-12-29 MED ORDER — SODIUM CHLORIDE 0.9 % IV SOLN
1.0000 g | Freq: Two times a day (BID) | INTRAVENOUS | Status: DC
  Administered 2024-12-29 – 2025-01-01 (×8): 1 g via INTRAVENOUS
  Filled 2024-12-29 (×8): qty 20

## 2024-12-29 MED ORDER — HYDRALAZINE HCL 25 MG PO TABS
25.0000 mg | ORAL_TABLET | Freq: Three times a day (TID) | ORAL | Status: DC
  Administered 2024-12-29: 25 mg via ORAL
  Filled 2024-12-29: qty 1

## 2024-12-29 MED ORDER — INSULIN REGULAR(HUMAN) IN NACL 100-0.9 UT/100ML-% IV SOLN
INTRAVENOUS | Status: DC
  Administered 2024-12-29: 5 [IU]/h via INTRAVENOUS
  Filled 2024-12-29: qty 100

## 2024-12-29 MED ORDER — WARFARIN SODIUM 2.5 MG PO TABS: 2.5000 mg | ORAL_TABLET | Freq: Once | ORAL | Status: DC

## 2024-12-29 MED ORDER — POLYETHYLENE GLYCOL 3350 17 G PO PACK: 17.0000 g | PACK | Freq: Every day | ORAL | Status: DC

## 2024-12-29 MED ORDER — PANTOPRAZOLE SODIUM 40 MG IV SOLR
40.0000 mg | INTRAVENOUS | Status: DC
  Administered 2024-12-29 – 2025-01-01 (×4): 40 mg via INTRAVENOUS
  Filled 2024-12-29 (×4): qty 10

## 2024-12-29 MED ORDER — OXYCODONE HCL 5 MG PO TABS
5.0000 mg | ORAL_TABLET | Freq: Once | ORAL | Status: AC | PRN
  Administered 2024-12-29: 5 mg via ORAL
  Filled 2024-12-29: qty 1

## 2024-12-29 MED ORDER — GABAPENTIN 250 MG/5ML PO SOLN
200.0000 mg | Freq: Every day | ORAL | Status: DC
  Administered 2024-12-29 – 2025-01-02 (×3): 200 mg
  Filled 2024-12-29 (×6): qty 4

## 2024-12-29 MED ORDER — METOLAZONE 5 MG PO TABS
5.0000 mg | ORAL_TABLET | Freq: Once | ORAL | Status: AC
  Administered 2024-12-29: 5 mg via ORAL
  Filled 2024-12-29: qty 1

## 2024-12-29 MED ORDER — SILDENAFIL CITRATE 20 MG PO TABS
20.0000 mg | ORAL_TABLET | Freq: Three times a day (TID) | ORAL | Status: DC
  Administered 2024-12-29: 20 mg via ORAL
  Filled 2024-12-29 (×3): qty 1

## 2024-12-29 MED ORDER — HYDRALAZINE HCL 25 MG PO TABS: 25.0000 mg | ORAL_TABLET | Freq: Three times a day (TID) | ORAL | Status: DC

## 2024-12-29 MED ORDER — DOCUSATE SODIUM 50 MG/5ML PO LIQD
100.0000 mg | Freq: Every day | ORAL | Status: DC
  Administered 2025-01-02: 100 mg
  Filled 2024-12-29: qty 10

## 2024-12-29 MED ORDER — WARFARIN SODIUM 3 MG PO TABS
3.0000 mg | ORAL_TABLET | Freq: Once | ORAL | Status: AC
  Administered 2024-12-29: 3 mg
  Filled 2024-12-29: qty 1

## 2024-12-29 MED ORDER — HYDRALAZINE HCL 50 MG PO TABS
50.0000 mg | ORAL_TABLET | Freq: Three times a day (TID) | ORAL | Status: DC
  Administered 2024-12-29 – 2024-12-31 (×6): 50 mg
  Filled 2024-12-29 (×6): qty 1

## 2024-12-29 NOTE — Progress Notes (Addendum)
 Patient ID: Karen Warner, female   DOB: [birth year], 74 y.o.   MRN: [id number]     Advanced Heart Failure Rounding Note  Cardiologist: Dub Huntsman, DO  [facility] Cardiologist: Dr Rolan  Chief Complaint: Post Op HMIII LVAD  Patient Profile   Karen Warner is a 74 y.o. female with history of CAD and ischemic cardiomyopathy, EF <20%, CKD III and COPD admitted w/ a/c CHF, chest/abdominal pain. RHC c/w low output.   Significant events:   1/15: RHC (RA 2, PA 21/10, PCW 3,  PA sat 59%, TD CI 1.49, FICK CI 1.88, PAPi 5.5), started on milrinone            LHC patent stents, nonobstructive CAD   1/19: co-ox persistently in 40s, milrinone  increased 0.375 12/23/24: S/P HMIII Implant.Intra-op speed placed at 4700 due to low filling pressures.  Received 2UPRBC, 3 FFP, cellsaver, 1 albumin , and 4 FFP  12/23/24 POCUS: VAD well positioned. RV looked ok but PAPI low with high CVP 12/24/24: Extubated. Had some low flows. Cleveprex started 1/25: Re-intubated with desaturation, increased work of breathing 1/26: Ramp echo, speed increased to 5100 rpm.  1 unit PRBCs. 1/27: Extubated. Increased work of breathing, back on Bipap 1/28: Stopped Clevidipine   Subjective:   Epi 0.5 mcg, milrinone  0375 mcg. CO-OX 69%  PAP: (22-40)/(9-27) 39/27 CVP:  [2 mmHg-18 mmHg] 17 mmHg CO:  [5.8 L/min-6 L/min] 5.8 L/min CI:  [3.2 L/min/m2-3.3 L/min/m2] 3.2 L/min/m2  HeartMate 2 VAD Equipment Check Pump Speed (RPM): 4600 RPM HeartMate 3 VAD Equipment Check Pump Speed (RPM): 5100 RPM Pump Flow (LPM): 4.1 Power (Watts): 3 Watts Pulsatility Index: 3.5 Fixed Speed Limit: 5100 rpm Low Speed Limit: 4800 rpm Alarms: No alarms Auscultated: Normal expected humming Power Module Self-Test (Daily): Done System Controller Self-Test: Passed Patient Battery Source: [facility] / Wall unit Emergency Equipment at Bedside: Yes  Feeling better.    Objective:   Weight Range: 78.6 kg Body mass index is 28.84 kg/m.    Vital Signs:   Temp:  [98.8 F (37.1 C)-100 F (37.8 C)] 98.8 F (37.1 C) (01/29 0716) Pulse Rate:  [113-141] 117 (01/29 0716) Resp:  [16-36] 20 (01/29 0716) BP: (102-108)/(70-75) 108/75 (01/28 1150) SpO2:  [92 %-100 %] 99 % (01/29 0717) Arterial Line BP: (68-127)/(55-81) 104/69 (01/29 0600) FiO2 (%):  [70 %] 70 % (01/28 0808) Weight:  [78.6 kg] 78.6 kg (01/29 0530) Last BM Date : 12/28/24  Weight change: Filed Weights   12/27/24 0500 12/28/24 0500 12/29/24 0530  Weight: 78.8 kg 77.8 kg 78.6 kg    Intake/Output:   Intake/Output Summary (Last 24 hours) at 12/29/2024 0720 Last data filed at 12/29/2024 0700 Gross per 24 hour  Intake 2860.68 ml  Output 2365 ml  Net 495.68 ml    CVP 12-13 Physical Exam   Physical Exam: GENERAL: No acute distress. HEENT: normal  NECK: Supple, JVP elevated. SABRA CARDIAC:  Mechanical heart sounds with LVAD hum present.  LUNGS:  Clear to auscultation bilaterally.  ABDOMEN:  Soft, round, nontender,.     LVAD exit site:  Dressing dry and intact.   EXTREMITIES:  Warm and dry, no cyanosis, clubbing, rash or edema  NEUROLOGIC:  Alert and oriented x 3.       Telemetry   Sinus Tach 110s   Labs   CBC Recent Labs    12/28/24 0435 12/28/24 0534 12/29/24 0329 12/29/24 0546  WBC 17.4*  --  15.6*  --   NEUTROABS 13.9*  --  11.9*  --  HGB 9.5*   < > 8.7* 9.2*  HCT 28.5*   < > 26.5* 27.0*  MCV 90.2  --  91.1  --   PLT 248  --  308  --    < > = values in this interval not displayed.   Basic Metabolic Panel Recent Labs    98/71/73 1547 12/28/24 1555 12/29/24 0329 12/29/24 0546  NA 140  140   < > 138 141  K 3.7  3.7   < > 4.3 3.8  CL 104  104  --  104  --   CO2 22  22  --  23  --   GLUCOSE 208*  208*  --  254*  --   BUN 16  16  --  22  --   CREATININE 1.09*  1.05*  --  1.22*  --   CALCIUM  8.6*  8.6*  --  8.5*  --   MG 2.2  --  2.3  --   PHOS 2.4*  --  2.2*  --    < > = values in this interval not displayed.   Liver  Function Tests Recent Labs    12/28/24 0810 12/28/24 1547  AST 39  --   ALT 6  --   ALKPHOS 98  --   BILITOT 0.5  --   PROT 6.6  --   ALBUMIN  3.6 3.5   No results for input(s): LIPASE, AMYLASE in the last 72 hours. Cardiac Enzymes No results for input(s): CKTOTAL, CKMB, CKMBINDEX, TROPONINI in the last 72 hours.  BNP: BNP (last 3 results) Recent Labs    09/01/24 1457  BNP 169.4*    ProBNP (last 3 results) Recent Labs    12/13/24 1529 12/14/24 0826 12/24/24 0406  PROBNP 1,342.0* 1,539.0* 3,345.0*     D-Dimer No results for input(s): DDIMER in the last 72 hours.  Hemoglobin A1C No results for input(s): HGBA1C in the last 72 hours. Fasting Lipid Panel No results for input(s): CHOL, HDL, LDLCALC, TRIG, CHOLHDL, LDLDIRECT in the last 72 hours. Medications:   Scheduled Medications:  arformoterol   15 mcg Nebulization BID   Chlorhexidine  Gluconate Cloth  6 each Topical Daily   docusate sodium   100 mg Oral Daily   feeding supplement (PIVOT 1.5 CAL)  1,000 mL Per Tube Q24H   gabapentin   200 mg Oral QHS   hydrALAZINE   20 mg Intravenous Q4H   insulin  aspart  0-24 Units Subcutaneous Q4H   insulin  aspart  3 Units Subcutaneous Q4H   lidocaine   2 patch Transdermal Q24H   mouth rinse  15 mL Mouth Rinse 4 times per day   pantoprazole   40 mg Oral Daily   polyethylene glycol  17 g Oral Daily   potassium & sodium phosphates   2 packet Per Tube Q4H   revefenacin   175 mcg Nebulization Daily   sodium chloride  flush  3 mL Intravenous Q12H   Vitamin D  (Ergocalciferol )  50,000 Units Oral Q Sun   Warfarin - Pharmacist Dosing Inpatient   Does not apply q1600    Infusions:  acetaminophen  Stopped (12/29/24 0322)   amiodarone  30 mg/hr (12/29/24 0700)   epinephrine  0.5 mcg/min (12/29/24 0700)   furosemide  (LASIX ) 200 mg in dextrose  5 % 100 mL (2 mg/mL) infusion 10 mg/hr (12/29/24 0700)   heparin  500 Units/hr (12/29/24 0700)   meropenem  (MERREM ) IV Stopped  (12/29/24 0349)   milrinone  0.375 mcg/kg/min (12/29/24 0700)   nitroGLYCERIN  Stopped (12/28/24 1828)   vancomycin  Stopped (12/28/24 1811)  PRN Medications: dextrose , fentaNYL  (SUBLIMAZE ) injection, hydrALAZINE , LORazepam , ondansetron  (ZOFRAN ) IV, mouth rinse, oxyCODONE , sodium chloride  flush     Assessment/Plan   1. 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  - Intra Op Speed 4600 due to low filling pressures.  - Ramp echo 1/26: RV small, moderate dysfunction; interventricular septum bows to the right; the aortic valve does not open. Speed increased from 4800 step-wise to 5100.  Flow increased 3.0 L/min => 3.7 L/min, no ectopy.  LVIDD decreased appropriately.  At 5100 rpm, the IV septum still had some rightward shift but less than initially and the RV appeared to fill more.  - Currently on milrinone  0.375 + epinephrine  0.5 - Improved off Clevidipine  => possibly this was causing pulmonary A-V shunting.   + Restart sildenafil   - Wean iNO today.  - Add hydralazine  25 mg every f8 hours. With PRN IV hydralazine .   - CVP 14. Increase lasix  drip 15 mg + metolazone  5 mg  today.  - INR 1.4, on heparin  gtt . Given dose of coumadin   2.  Acute on chronic systolic CHF: Ischemic cardiomyopathy.  [employer] CRT-D device  - She is end-stage NYHA IV.  - s/p HM-3 VAD - management as above  3. Acute hypoxic respiratory failure - reintubated 1/25, extubated 1/27, had to go back on Bipap last night due to increased work of breathing and remains on Bipap.  - Suspect PNA, persistently febrile despite regular Tylenol  with WBCs 17.  Now on vancomycin /meropenem .   4. CAD: PCI to mid/distal LAD in 2020 and proximal LAD in 2021.   Cath this admit showed stable coronary anatomy. Suspect CP related to HF as above - Off ranolazine , imdur , plavix  - Continue Crestor  + Zetia  eventually.   5. COPD: History of smoking, quit 2021.    6. CKD 3: Follows with  nephology at [facility].  - Creatinine stable.   7. Obesity: Has been on semaglutide. Now on hold.   8. PAD: Moderately decreased ABI on right in 7/25. Medical mgmt per Dr. Serene. Again ABI on 1/19, the R had moderate disease, and mild on the L. No claudication or other PAD symptoms.   9. Anemia: Post-op, 1 unit PRBCs 1/25.  Hgb 8.7 today.   - Transfuse Hgb < 8.   10. FEN: TFs ongoing.   11. Rhythm: NSR on amiodarone  gtt 30   Consult PT.    CRITICAL CARE Performed by: Greig Mosses [facility]  Total critical care time: 45 minutes  Critical care time was exclusive of separately billable procedures and treating other patients.  Critical care was necessary to treat or prevent imminent or life-threatening deterioration.  Critical care was time spent personally by me on the following activities: development of treatment plan with patient and/or surrogate as well as nursing, discussions with consultants, evaluation of patient's response to treatment, examination of patient, obtaining history from patient or surrogate, ordering and performing treatments and interventions, ordering and review of laboratory studies, ordering and review of radiographic studies, pulse oximetry and re-evaluation of patient's condition.   Length of Stay: 14  Amy Clegg, NP  12/29/2024, 7:20 AM  Advanced Heart Failure Team Pager [phone] (M-F; 7a - 5p)   Please visit [email or web address]: For overnight coverage please call cardiology fellow first. If fellow not available call Shock/ECMO MD on call.  For ECMO / Mechanical Support (Impella, IABP, LVAD) issues call Shock / ECMO MD on call.   Patient seen with NP,  I formulated the plan and agree with the above note.   Co-ox 69% this morning with CVP 12-14, CI 3.7 via CCO Swan.  I/Os even on Lasix  gtt 10 mg/hr.  She is on epinephrine  0.5, now off NTG and on hydralazine  20 IV q4 hrs.    Tm 100 yesterday pm with WBCs trending down on meropenem /vancomycin . Off steroids.   Good flow  on LVAD 4.3 L/min, no alarms.   General: Well appearing this am. NAD.  HEENT: Normal. Neck: Supple, JVP 12-14 cm cm. Carotids OK.  Cardiac:  Mechanical heart sounds with LVAD hum present.  Lungs:  CTAB, normal effort.  Abdomen:  NT, ND, no HSM. No bruits or masses. +BS  LVAD exit site: Well-healed and incorporated. Dressing dry and intact. No erythema or drainage. Stabilization device present and accurately applied. Driveline dressing changed daily per sterile technique. Extremities:  Warm and dry. No cyanosis, clubbing, rash, or edema.  Neuro:  Alert & oriented x 3. Cranial nerves grossly intact. Moves all 4 extremities w/o difficulty. Affect pleasant    Doing better, now on 8 L HHFNC.  Possible intrapulmonary shunting triggered by clevidipine , now off.   Good cardiac output this morning.  Very sensitive to elevated MAP (low flows).  Remains on low dose epinephrine  0.5 for RV, continue for now.  Off NTG gtt.  Will transition hydralazine  from 20 q 4 IV to 25 mg q8 hrs po and can increase as needed.  Transition from iNO back to sildenafil  20 tid. Think we can remove Swan today and place PICC.  Would leave arterial line today given labile BP, maybe remove tomorrow if stable on po hydralazine .   She needs diuresis with CVP 12-14.  Will increase Lasix  gtt to 15 mg/hr and give metolazone  5 mg po x 1.   Continue vancomycin /meropenem , fever curve improving.   On heparin  gtt and warfarin with INR 1.6.   CRITICAL CARE Performed by: Ezra Shuck  Total critical care time: 45 minutes  Critical care time was exclusive of separately billable procedures and treating other patients.  Critical care was necessary to treat or prevent imminent or life-threatening deterioration.  Critical care was time spent personally by me on the following activities: development of treatment plan with patient and/or surrogate as well as nursing, discussions with consultants, evaluation of patient's response to  treatment, examination of patient, obtaining history from patient or surrogate, ordering and performing treatments and interventions, ordering and review of laboratory studies, ordering and review of radiographic studies, pulse oximetry and re-evaluation of patient's condition.  Ezra Shuck 12/29/2024 7:48 AM

## 2024-12-29 NOTE — Progress Notes (Signed)
 Patient ID: Karen Warner, female   DOB: 12/02/1950, 74 y.o.   MRN: [id number]  TCTS Evening Rounds:  Hemodynamically stable today on same drips.  Diuresing better on lasix  15 and metolazone .  Sats 97% on 6L.  New PICC today and sleeve, swan out.  Up in chair.

## 2024-12-29 NOTE — Progress Notes (Signed)
 OT Cancellation Note  Patient Details Name: Karen Warner MRN: [id number] DOB: [birth year]   Cancelled Treatment:    Reason Eval/Treat Not Completed: Patient not medically ready (Awaiting extubation this morning; OT to follow up.)  Lucie JONETTA Kendall 12/29/2024, 8:47 AM

## 2024-12-29 NOTE — Inpatient Diabetes Management (Signed)
 Inpatient Diabetes Program Recommendations  AACE/ADA: New Consensus Statement on Inpatient Glycemic Control (2015)  Target Ranges:  Prepandial:   less than 140 mg/dL      Peak postprandial:   less than 180 mg/dL (1-2 hours)      Critically ill patients:  140 - 180 mg/dL   Lab Results  Component Value Date   GLUCAP 259 (H) 12/29/2024   HGBA1C 6.6 (H) 12/19/2024    Review of Glycemic Control  Latest Reference Range & Units 12/28/24 15:43 12/28/24 19:54 12/28/24 23:47 12/29/24 03:21 12/29/24 07:54  Glucose-Capillary 70 - 99 mg/dL 821 (H) 788 (H) 749 (H) 232 (H) 259 (H)   Diabetes history: DM 2 Outpatient Diabetes medications:  Tresiba 26 units bid Humalog 2-20 units bid?? Ozempic 1 mg q week  Current orders for Inpatient glycemic control:  Novolog  0-24 units q 4 hours Novolog  6 units q 4 hours Pivot 30 ml/hr  Inpatient Diabetes Program Recommendations:    Consider adding back some basal insulin .  Consider adding Lantus  10 units bid.   Thanks,  Randall Bullocks, RN, BC-ADM Inpatient Diabetes Coordinator Pager [phone]  (8a-5p)

## 2024-12-29 NOTE — Progress Notes (Addendum)
 PHARMACY - ANTICOAGULATION CONSULT NOTE  Pharmacy Consult for heparin  bridge to warfarin  Indication: LVAD  Allergies[1]  Patient Measurements: Height: 5' 5 (165.1 cm) Weight: 78.6 kg (173 lb 4.5 oz) IBW/kg (Calculated) : 57 HEPARIN  DW (KG): 72  Vital Signs: Temp: 98.6 F (37 C) (01/29 1000) Temp Source: Bladder (01/29 0800) Pulse Rate: 121 (01/29 1000)  Labs: Recent Labs    12/27/24 0519 12/27/24 9357 12/28/24 0435 12/28/24 0534 12/28/24 1547 12/28/24 1555 12/29/24 0329 12/29/24 0546  HGB 8.9*   < > 9.5*   < >  --  9.5* 8.7* 9.2*  HCT 27.5*   < > 28.5*   < >  --  28.0* 26.5* 27.0*  PLT 210  --  248  --   --   --  308  --   LABPROT 16.9*  --  17.6*  --   --   --  19.6*  --   INR 1.3*  --  1.4*  --   --   --  1.6*  --   HEPARINUNFRC <0.10*  --  <0.10*  --   --   --  <0.10*  --   CREATININE  --    < > 1.00  --  1.09*  1.05*  --  1.22*  --    < > = values in this interval not displayed.    Estimated Creatinine Clearance: 41.9 mL/min (A) (by C-G formula based on SCr of 1.22 mg/dL (H)).   Medical History: Past Medical History:  Diagnosis Date   AICD (automatic cardioverter/defibrillator) present    Anemia 12/12/2019   Anginal pain    Arthralgia 01/14/2020   Arthritis    Asthma    Atherosclerotic heart disease of native coronary artery without angina pectoris 04/13/2014   CHF (congestive heart failure) (HCC)    Chronic bilateral low back pain with bilateral sciatica 01/13/2019   Chronic bladder pain 11/08/2014   Last Assessment & Plan:  Formatting of this note might be different from the original. Patient has chronic pain syndrome in general I think this is unfortunately worsening her pelvic pain and bladder pain her examination today was very reassuring I am advised her to use the estrogen cream I did start her on Elavil 10 milligrams at that time if she does tolerated will increase it to 25 milligrams.    Chronic headaches    Chronic idiopathic constipation  12/22/2014   Formatting of this note might be different from the original. Last Assessment & Plan:  For better bowel emptying please use either Citrucel / Benefiber start with 2 tablespoons daily and titrate up or down to effect.  Also please use glycerin  suppositories as needed to assist in evacuation. Last Assessment & Plan:  Formatting of this note might be different from the original. For better bowel empt   Chronic obstructive pulmonary disease, unspecified (HCC) 03/18/2018   CKD (chronic kidney disease) stage 3, GFR 30-59 ml/min (HCC)    Class 1 obesity due to excess calories with serious comorbidity and body mass index (BMI) of 31.0 to 31.9 in adult 06/26/2020   Colon polyps    COPD (chronic obstructive pulmonary disease) (HCC)    Coronary artery disease    DDD (degenerative disc disease), cervical 01/13/2019   Depression 11/05/2019   Diabetic polyneuropathy associated with type 2 diabetes mellitus (HCC) 02/24/2019   Diverticulitis 05/20/2018   Diverticulitis of colon 03/18/2018   Family history of ischemic heart disease (IHD) 08/16/2013   Fibromyalgia  Fibromyalgia affecting multiple sites 01/14/2020   Gastro-esophageal reflux disease without esophagitis 01/13/2019   Glaucoma 11/08/2014   Hordeolum externum of left upper eyelid 03/13/2020   Hypertension 11/08/2014   IBS (irritable bowel syndrome)    Iron deficiency anemia 01/13/2019   Left renal mass 11/08/2019   Leukocytosis 05/28/2018   Low vitamin B12 level 03/13/2020   Low vitamin D  level 12/12/2019   LV dysfunction 05/31/2019   Malignant essential hypertension 01/22/2016   Migraine, unspecified, not intractable, without status migrainosus 11/08/2014   Mixed hyperlipidemia 01/13/2019   Myocardial infarction (HCC)    Other premature beats 11/08/2014   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 Last Assessment & Plan:  For vaginal atrophy please place a pea size dab of Estrogen vaginal cream  into the vagina 3 times a week ( Monday, Wednesday, Friday) Last Assessment & Plan:  Formatting of this note might be different from the original. For vaginal atrophy please place a pea size dab of Estrogen vaginal cream  into the vagina     Medications:  Scheduled:   arformoterol   15 mcg Nebulization BID   Chlorhexidine  Gluconate Cloth  6 each Topical Daily   docusate sodium   100 mg Oral Daily   feeding supplement (PIVOT 1.5 CAL)  1,000 mL Per Tube Q24H   gabapentin   200 mg Oral QHS   hydrALAZINE   25 mg Oral Q8H   insulin  aspart  0-24 Units Subcutaneous Q4H   insulin  aspart  6 Units Subcutaneous Q4H   lidocaine   2 patch Transdermal Q24H   mouth rinse  15 mL Mouth Rinse 4 times per day   pantoprazole   40 mg Oral Daily   polyethylene glycol  17 g Oral Daily   potassium & sodium phosphates   2 packet Per Tube Q4H   revefenacin   175 mcg Nebulization Daily   sildenafil   20 mg Oral TID   sodium chloride  flush  3 mL Intravenous Q12H   Vitamin D  (Ergocalciferol )  50,000 Units Oral Q Sun   Warfarin - Pharmacist Dosing Inpatient   Does not apply q1600    Assessment: Patient is a 74 year old female with new LVAD placement 1/23. She has been receiving heparin  at 500 units/hr fixed per AHF. She was not on anticoagulation PTA. Pharmacy is consulted to initiate warfarin for LVAD.   Today is post-op day 5. She has a pocket drain that is putting out minimal output. Hgb stable 9.2, PLT increasing 308. No signs of overt bleeding. Baseline INR 1.3, today 1.6 after 1 dose Warfarin 2.5 mg. T bili 0.5. HL <0.1.   Interactions: Patient with little PO intake. Will  initiate tube feeds at trickle this afternoon which will decrease INR. Also on meropenem .   Goal of Therapy:  INR 2-2.5   Heparin  level <0.3  Monitor platelets by anticoagulation protocol: Yes   Plan:  Give Warfarin 3 mg tonight.  Continue heparin  500 units/hr until INR 1.8 per protocol.  Monitor daily INR, CBC, and HL.    Karen Warner M Jenisha Faison 12/29/2024,10:13 AM        [1]  Allergies Allergen Reactions   Bee  Venom Anaphylaxis   Sumatriptan Shortness Of Breath    Migraine worsened   Amoxicillin-Pot Clavulanate Diarrhea and Nausea And Vomiting    GI Intolerance   Oxycodone  Itching and Hives    Other reaction(s): Other (see comments) Funny feeling in head  off the edge    Buprenorphine Hcl     Other reaction(s): Itching   Clarithromycin Diarrhea    Abdominal pain   Duloxetine  Hcl Other (See Comments)    drowsiness   Hydrocodone Itching   Lactose Intolerance (Gi) Other (See Comments)    Upset stomach, gas/bloating   Liraglutide Other (See Comments)    Abdominal discomfort   Tramadol Hcl Itching

## 2024-12-29 NOTE — TOC Progression Note (Signed)
 Transition of Care [facility]) - Progression Note    Patient Details  Name: Karen Warner MRN: [id number] Date of Birth: Dec 29, 1950  Transition of Care [facility]) CM/SW Contact  [name] Phone Number: [phone] 12/29/2024, 9:46 AM  Clinical Narrative:   Per chart review, patient is not medically stable for dc. LVAD on 1/23. ICM will continue to follow.    HF CSW/CM will continue to follow and monitor for dc readiness.    Expected Discharge Plan: Home/Self Care Barriers to Discharge: Continued Medical Work up               Expected Discharge Plan and Services       Living arrangements for the past 2 months: Single Family Home                                       Social Drivers of Health (SDOH) Interventions SDOH Screenings   Food Insecurity: No Food Insecurity (12/15/2024)  Housing: Low Risk (12/17/2024)  Transportation Needs: No Transportation Needs (12/17/2024)  Utilities: Not At Risk (12/17/2024)  Depression (PHQ2-9): Low Risk (05/26/2024)  Financial Resource Strain: Low Risk (12/07/2024)   Received from [facility]  Social Connections: Moderately Integrated (12/17/2024)  Tobacco Use: Medium Risk (12/23/2024)    Readmission Risk Interventions     No data to display

## 2024-12-29 NOTE — Plan of Care (Signed)
" °  Problem: Clinical Measurements: Goal: Respiratory complications will improve Outcome: Progressing Goal: Cardiovascular complication will be avoided Outcome: Progressing   Problem: Cardiovascular: Goal: Ability to achieve and maintain adequate cardiovascular perfusion will improve Outcome: Progressing   Problem: Skin Integrity: Goal: Risk for impaired skin integrity will decrease Outcome: Progressing   "

## 2024-12-29 NOTE — Progress Notes (Signed)
 Peripherally Inserted Central Catheter Placement  The IV Nurse has discussed with the patient and/or persons authorized to consent for the patient, the purpose of this procedure and the potential benefits and risks involved with this procedure.  The benefits include less needle sticks, lab draws from the catheter, and the patient may be discharged home with the catheter. Risks include, but not limited to, infection, bleeding, blood clot (thrombus formation), and puncture of an artery; nerve damage and irregular heartbeat and possibility to perform a PICC exchange if needed/ordered by physician.  Alternatives to this procedure were also discussed.  Bard Power PICC patient education guide, fact sheet on infection prevention and patient information card has been provided to patient /or left at bedside.    PICC Placement Documentation  PICC Triple Lumen 12/29/24 Right Basilic 38 cm 1 cm (Active)  Indication for Insertion or Continuance of Line Vasoactive infusions 12/29/24 1250  Exposed Catheter (cm) 1 cm 12/29/24 1250  Site Assessment Clean, Dry, Intact 12/29/24 1250  Lumen #1 Status Flushed;Blood return noted;Saline locked 12/29/24 1250  Lumen #2 Status Flushed;Blood return noted;Saline locked 12/29/24 1250  Lumen #3 Status Flushed;Blood return noted;Saline locked 12/29/24 1250  Dressing Type Transparent 12/29/24 1250  Dressing Status Antimicrobial disc/dressing in place 12/29/24 1250  Line Care Connections checked and tightened 12/29/24 1250  Line Adjustment (NICU/IV Team Only) No 12/29/24 1250  Dressing Intervention New dressing 12/29/24 1250  Dressing Change Due 01/05/25 12/29/24 1250       Karen Warner 12/29/2024, 12:53 PM

## 2024-12-29 NOTE — Progress Notes (Signed)
 LVAD Coordinator Rounding Note:  Admitted 12/14/24 due to CHF exacerbation. Underwent LVAD evaluation.   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  1/23: S/P HMIII Implant.Intra-op speed placed at 4600 due to low filling pressures.   1/24: Extubated. Had some low flows. Cleveprex started. 1/25: Re-intubated with desaturation, increased work of breathing 1/26: Remains intubated.Cleveprex stopped.   Pt alert this morning. Denies complaints. No low flows since 1/27. Plan for PICC placement today and to remove Swan.  Pt's daughter in law Kim at bedside.  Vital signs: Temp: 99.9 HR: 115 Doppler Pressure: 80 Arterial BP: 92/66 (75) O2 Sat: 98% on  Wt: 183.6>173.7>171.5 lbs    LVAD interrogation reveals:  Speed:5100 Flow: 3.9 Power: 3.4 w PI: 4.3  Alarms: none Events:  none  Hematocrit: 23 Fixed speed: 5100 Low speed limit: 4800   Drive Line: Existing VAD dressing removed and site care performed using sterile technique. Drive line exit site cleaned with Chlora prep applicators x 2, allowed to dry, and Silverlon patch with gauze dressing applied. Exit site unincorporated, the velour is fully implanted at exit site. Small amount of sanguinous drainage noted on previous dressing. No redness, tenderness, foul odor or rash noted. Drive line anchor re-applied. Continue daily dressing changes by VAD coordinator or nurse champion. Next dressing change due 12/30/24.        Labs:  LDH trend: 464>529>535>539>612>672  INR trend: 1.3>1.5>1.6>1.3>1.4>1.6  WBC trend: 18.5>23.6>21.0>15.2>17.4>15.6  Hgb trend: 9.2>8.5>7.6>8.9>9.5>8.7  Anticoagulation Plan: - INR Goal: 2.0 - 2.5 - ASA Dose: none - Heparin  gtt per pharmacy - Coumadin  dosing per pharmacy  Blood Products:  - Intra-op 1/23:   2 PRBC  4 FFP  225 cc cell saver - Post op:  1/26: 1 PRBC  Device: - [employer] -Therapies: OFF  Arrythmias:   Respiratory: Extubated 1/24.  Reintubated 1/25 due to respiratory distress. Extubated 1/27.   Infection:   Renal:  -BUN/CRT: 10/1.3>11/1.15>23/1.34>19/1.03>15/1>22/1.22  Adverse Events on VAD:  Drips:  Epinephrine  0.5 mcg/min Milrinone  0.375 mcg/kg/min Heparin  500 units/hr Amiodarone  30mg /hr Lasix  15 mg/hr  Patient Education:  Pt's daughter in law Kim observed dressing change today at bedside  Plan/Recommendations:  1. Page VAD coordinator with any drive line or equipment concerns 2. Daily drive line dressing changes per VAD coordinator or nurse champion  Schuyler Lunger RN, BSN VAD Coordinator 24/7 Pager [phone]

## 2024-12-29 NOTE — Progress Notes (Signed)
 This chaplain responded to PMT NP-Alicia's consult for spiritual care during a difficult recovery, post LVAD. The chaplain reviewed the chart notes and received an update from the Pt. RN-Melanie before the visit. The Pt. is awake and accepting of the visit.  The Pt. accepted the chaplain's invitation for story telling. The chaplain understands the family leaves a legacy for the Pt. and provides meaning for the Pt. recovery. As the Pt. reflects, she shares God is her partner in the difficult days of recovery. The chaplain observes a small smile in the midst of the weakness she is feeling today.  The Pt. accepted the chaplain's invitation for prayer and F/U spiritual care.  Chaplain Leeroy Hummer [phone]

## 2024-12-29 NOTE — Progress Notes (Signed)
 HeartMate 3 Rounding Note  Subjective:    More alert this am. Had a stable night on HFNC. Sats 99% this am.  Milrinone  0.375, epi 0.5. CVP 13-14.  Co-ox 69.  + 500 cc yesterday on lasix  drip 5.  LVAD INTERROGATION:  HeartMate IIl LVAD:  Flow 4.1 liters/min, speed 5100, power 3, PI 3.5.    Objective:    Vital Signs:   Temp:  [99 F (37.2 C)-100 F (37.8 C)] 99 F (37.2 C) (01/29 0600) Pulse Rate:  [113-141] 123 (01/29 0600) Resp:  [16-36] 19 (01/29 0600) BP: (102-108)/(70-75) 108/75 (01/28 1150) SpO2:  [92 %-100 %] 99 % (01/29 0600) Arterial Line BP: (68-127)/(55-81) 104/69 (01/29 0600) FiO2 (%):  [70 %] 70 % (01/28 0808) Weight:  [78.6 kg] 78.6 kg (01/29 0530) Last BM Date : 12/28/24 Mean arterial Pressure 80's-90's  Intake/Output:   Intake/Output Summary (Last 24 hours) at 12/29/2024 0716 Last data filed at 12/29/2024 0700 Gross per 24 hour  Intake 2860.68 ml  Output 2365 ml  Net 495.68 ml     Physical Exam: General:  more alert and interactive. Answers questions. Taking sips of liquids.  Still looks a little labored breathing but better than yesterday. HEENT: normal Neck: normal Cor: Distant heart sounds with LVAD hum present. Lungs: clear Abdomen: soft, nontender, nondistended. Good bowel sounds. Extremities: no edema Neuro: alert & orientedx3, cranial nerves grossly intact. moves all 4 extremities w/o difficulty. Affect pleasant  Telemetry: internally paced. Sensing A and pacing V.  Labs: Basic Metabolic Panel: Recent Labs  Lab 12/26/24 0454 12/26/24 0455 12/27/24 0519 12/27/24 9357 12/27/24 0734 12/27/24 1848 12/27/24 1855 12/27/24 2014 12/28/24 0435 12/28/24 0534 12/28/24 1238 12/28/24 1547 12/28/24 1555 12/29/24 0329 12/29/24 0546  NA 138   < >  --    < > 143   < > 138   < > 138   < > 140 140  140 141 138 141  K 3.6   < >  --    < > 4.0   < > 3.0*   < > 3.6   < > 3.5 3.7  3.7 3.5 4.3 3.8  CL 108  --   --   --  112*  --  103  --  102  --    --  104  104  --  104  --   CO2 19*  --   --   --  21*  --  20*  --  23  --   --  22  22  --  23  --   GLUCOSE 231*  --   --   --  168*  --  263*  --  234*  --   --  208*  208*  --  254*  --   BUN 23  --   --   --  19  --  17  --  15  --   --  16  16  --  22  --   CREATININE 1.34*  --   --   --  1.03*  --  0.94  --  1.00  --   --  1.09*  1.05*  --  1.22*  --   CALCIUM  8.2*  --   --   --  8.3*  --  8.1*  --  8.6*  --   --  8.6*  8.6*  --  8.5*  --   MG 2.7*  --  2.3  --   --   --   --   --  1.8  --   --  2.2  --  2.3  --   PHOS 2.1*  --  1.8*  --   --   --   --   --  4.0  --   --  2.4*  --  2.2*  --    < > = values in this interval not displayed.    Liver Function Tests: Recent Labs  Lab 12/23/24 0223 12/24/24 0406 12/25/24 0414 12/26/24 0454 12/28/24 0810 12/28/24 1547  AST 19 117* 123* 77* 39  --   ALT 8 12 11 8 6   --   ALKPHOS 62 52 61 68 98  --   BILITOT 0.3 0.6 1.6* 0.6 0.5  --   PROT 7.3 5.9* 6.1* 5.6* 6.6  --   ALBUMIN  4.0 3.8 3.9 3.4* 3.6 3.5   No results for input(s): LIPASE, AMYLASE in the last 168 hours. No results for input(s): AMMONIA in the last 168 hours.  CBC: Recent Labs  Lab 12/25/24 0414 12/25/24 0515 12/26/24 0454 12/26/24 0455 12/27/24 0519 12/27/24 9357 12/28/24 0435 12/28/24 0534 12/28/24 1238 12/28/24 1555 12/29/24 0329 12/29/24 0546  WBC 23.6*  --  21.0*  --  15.2*  --  17.4*  --   --   --  15.6*  --   NEUTROABS 19.9*  --  17.1*  --  11.2*  --  13.9*  --   --   --  11.9*  --   HGB 8.5*   < > 7.6*   < > 8.9*   < > 9.5* 9.9* 9.9* 9.5* 8.7* 9.2*  HCT 25.7*   < > 22.7*   < > 27.5*   < > 28.5* 29.0* 29.0* 28.0* 26.5* 27.0*  MCV 96.3  --  94.2  --  92.9  --  90.2  --   --   --  91.1  --   PLT 182  --  184  --  210  --  248  --   --   --  308  --    < > = values in this interval not displayed.    INR: Recent Labs  Lab 12/25/24 0414 12/26/24 0454 12/27/24 0519 12/28/24 0435 12/29/24 0329  INR 1.5* 1.6* 1.3* 1.4* 1.6*     Other results: EKG:   Imaging: DG Abd Portable 1V Result Date: 12/28/2024 CLINICAL DATA:  Feeding tube placement. EXAM: DG ABD PORTABLE 1V COMPARISON:  12/25/2024. FINDINGS: Nasogastric tube has been removed. Feeding tube terminates in the body of the stomach. IMPRESSION: Feeding tube terminates in the body of the stomach. Electronically Signed   By: Newell Eke M.D.   On: 12/28/2024 17:19   DG CHEST PORT 1 VIEW Result Date: 12/28/2024 CLINICAL DATA:  Left ventricular assist device present. EXAM: PORTABLE CHEST 1 VIEW COMPARISON:  12/27/2024 FINDINGS: Again noted is a left ventricular assist device. Right jugular central line with a pulmonary arterial catheter. Tip of the pulmonary catheter is in the right pulmonary artery. Left chest ICD appears to be stable. Negative for a pneumothorax. Linear densities at the right lung base could represent atelectasis. Limited evaluation of the left lung base due to the LVAD but cannot exclude pleural fluid or consolidation. Findings at the left lung base are unchanged. No overt pulmonary edema. IMPRESSION: 1. No significant change in the chest radiograph findings. 2. Support apparatuses as described. Negative for pneumothorax. 3. Probable left basilar densities. Electronically Signed   By: Juliene Philip HERO.D.  On: 12/28/2024 09:23   DG CHEST PORT 1 VIEW Result Date: 12/27/2024 EXAM: 1 VIEW XRAY OF THE CHEST 12/27/2024 06:42:00 PM COMPARISON: 12/27/2024 CLINICAL HISTORY: Acute hypoxic respiratory failure. FINDINGS: LINES, TUBES AND DEVICES: Endotracheal tube and enteric tube removed. Right IJ (Internal Jugular) Swan-Ganz catheter in place with tip overlying the expected area of the main pulmonary artery. Left chest pacemaker/AICD (Automatic Implantable Cardioverter Defibrillator) with leads terminating in the right atrium, right ventricle. LVAD (Left Ventricular Assist Device) in place. Right chest battery pack with lead tip extending into the right neck. LUNGS  AND PLEURA: Low lung volumes with bronchovascular crowding. Left pleural effusion. Retrocardiac airspace opacities. No pneumothorax. HEART AND MEDIASTINUM: Cardiomegaly. No acute abnormality of the mediastinal silhouette. BONES AND SOFT TISSUES: Sternotomy wires. IMPRESSION: 1. Low lung volumes with bronchovascular crowding and retrocardiac airspace opacities, which may represent atelectasis or developing pneumonia, in the correct clinical context. 2. Small left pleural effusion. 3. Cardiomegaly. Electronically signed by: Rogelia Myers MD 12/27/2024 07:16 PM EST RP Workstation: [id number]     Medications:     Scheduled Medications:  arformoterol   15 mcg Nebulization BID   Chlorhexidine  Gluconate Cloth  6 each Topical Daily   docusate sodium   100 mg Oral Daily   feeding supplement (PIVOT 1.5 CAL)  1,000 mL Per Tube Q24H   gabapentin   200 mg Oral QHS   hydrALAZINE   20 mg Intravenous Q4H   hydrocortisone  sod succinate (SOLU-CORTEF ) inj  100 mg Intravenous Q12H   insulin  aspart  0-24 Units Subcutaneous Q4H   insulin  aspart  3 Units Subcutaneous Q4H   lidocaine   2 patch Transdermal Q24H   mouth rinse  15 mL Mouth Rinse 4 times per day   pantoprazole   40 mg Oral Daily   polyethylene glycol  17 g Oral Daily   potassium & sodium phosphates   2 packet Per Tube Q4H   revefenacin   175 mcg Nebulization Daily   sodium chloride  flush  3 mL Intravenous Q12H   Vitamin D  (Ergocalciferol )  50,000 Units Oral Q Sun   Warfarin - Pharmacist Dosing Inpatient   Does not apply q1600    Infusions:  acetaminophen  Stopped (12/29/24 0322)   amiodarone  30 mg/hr (12/29/24 0700)   epinephrine  0.5 mcg/min (12/29/24 0700)   furosemide  (LASIX ) 200 mg in dextrose  5 % 100 mL (2 mg/mL) infusion 10 mg/hr (12/29/24 0700)   heparin  500 Units/hr (12/29/24 0700)   meropenem  (MERREM ) IV Stopped (12/29/24 0349)   milrinone  0.375 mcg/kg/min (12/29/24 0700)   nitroGLYCERIN  Stopped (12/28/24 1828)   vancomycin  Stopped  (12/28/24 1811)    PRN Medications: dextrose , fentaNYL  (SUBLIMAZE ) injection, hydrALAZINE , LORazepam , ondansetron  (ZOFRAN ) IV, mouth rinse, oxyCODONE , sodium chloride  flush   Assessment:   POD 6 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 Normal RV. Preop LVEF 20-25%. She has CRT-D  device and Barostim.   CAD with stable coronary anatomy per recent cath.   COPD with previous smoking until 2021.   Stage 3 CKD with creat around 1.5-1.6 preop.   Preop RUQ and epigastric abdominal pain with no evidence of acute cholecystitis on US  . Seen by GI. Felt to possibly be related to low CO.   Obesity.    Acute postop respiratory failure requiring reintubation over the weekend. Appeared to be developing sepsis with fever, leukocytosis, increased lactic acid. Started on vanc and Zosyn  with quick improvement in lactic acid. Temp curve improving with Tmax 100 yesterday, 98.8 this am.  On Merrem  and Vanc. BC  neative so far. This could just be SIRS early postop. All lines and foley new from OR. She received some steroids but WBC down a little to 15.6.   Acute postop blood loss anemia improved with transfusion and stable.  Plan/Discussion:    Hemodynamics are stable.   She is still 10 lbs over preop with elevated CVP and did not diurese with lasix  drip 5 yesterday. Probably need to increase but will leave that up to AHF team.  Continuing tube feeds for nutrition and can increase towards goal.   DC pocket drain.  Ideally would be nice to get PICC and remove swan and sleeve, arterial line that are 1 days old to minimize risk of infection. Not really using these numbers and can get from PICC. This will also allow easier transition to chair. She needs to mobilize out of bed.  INR trending up to 1.6. continue Coumadin  per pharmacy.  I reviewed the LVAD parameters from today, and compared the results to the patient's prior recorded data.  No programming changes were made.   The LVAD is functioning within specified parameters.  Length of Stay: [address]  Dorise POUR [facility] 12/29/2024, 7:16 AM

## 2024-12-29 NOTE — Progress Notes (Signed)
 Nutrition Follow-up  DOCUMENTATION CODES:   Not applicable  INTERVENTION:   Encourage po diet as tolerated, currently on CL  Tube Feeding via Cortrak:  Pivot 1.5 increased to 30 ml/hr today per MD Goal: Pivot 1.5 at 50 ml/hr TF at goal provides 113 g of protein, 1800 kcals and 912 mL of free water    NUTRITION DIAGNOSIS:   Increased nutrient needs related to chronic illness as evidenced by estimated needs.  Being addressed via TF   GOAL:   Patient will meet greater than or equal to 90% of their needs  Progressing  MONITOR:   Diet advancement, PO intake, TF tolerance, Labs, Weight trends, I & O's  REASON FOR ASSESSMENT:   Consult LVAD Eval  ASSESSMENT:   74 y.o. female present to the ED with chest pain. PMH includes GERD, CHF, T2DM, HLD, CAD, COPD, HTN, Glaucoma, diverticulitis, s/p pacemaker, [place] stimulator, fibromyalgia, and CKD III. Pt admitted with chest pain, acute on chronic HF, and AKI on CKD.  1/15 Admitted  1/23 HM3 LVAD implantation 1/24 Extubated, Cleviprex  started 1/25 Re-Intubated, pressors, trickle TF started 1/26 Remains on trickle TF 1/27 Extubated, later on BiPAP, iNO restarted 1/28 Cortrak placed, trickle TF initiated  HM3 LVAD on 1/23  Currently on lasix  gtt; UOP 2245 mL in 24 hours but only 465 mL overnight. 820 mL so far today. Remains net +for admission based on I/O flow sheet  Taking some CL Pivot 1.5 at 20 ml/hr, tolerating via Cortrak. MD increased to rate of 30 ml/hr today  +BM today, 1 BM yesterday  CBGs in 200-300s range-noted insulin  gtt started today  Current Weight: 78.6 kg, up from 77.8 kg yesterday; down overall post VAD with diuresis Admission Weight: 76 kg Estimated Dry Weight: unknown  Lines/Drains: Cortrak (gastric)  Labs: CBGs 200-300s (goal 140-180) Sodium 138  Potassium 4.3 (wdl) BUN 22 Creatinine 1.22 Phosphorus 2.2 (L) Magnesium  2.3 (wdl)  Meds: Colace Lasix  gtt Miralax  Protonix  Vitamin  D Coumadin    Diet Order:   Diet Order             Diet clear liquid Room service appropriate? Yes; Fluid consistency: Thin  Diet effective now                   EDUCATION NEEDS:   Education needs have been addressed  Skin:  Skin Assessment: Skin Integrity Issues: Skin Integrity Issues:: Incisions Incisions: Driveline- new LVAD on 12/23/24  Last BM:  1/29  Height:   Ht Readings from Last 1 Encounters:  12/23/24 5' 5 (1.651 m)    Weight:   Wt Readings from Last 1 Encounters:  12/29/24 78.6 kg    Ideal Body Weight:  56.8 kg  BMI:  Body mass index is 28.84 kg/m.  Estimated Nutritional Needs:   Kcal:  1800-2000  Protein:  90-115 g  Fluid:  1.8 L   Betsey Finger MS, RDN, LDN, CNSC Registered Dietitian 3 Clinical Nutrition RD Inpatient Contact Info in Amion

## 2024-12-29 NOTE — Progress Notes (Signed)
 "  NAME:  Karen Warner, MRN:  [id number], DOB:  30-Jun-1951, LOS: 14 ADMISSION DATE:  12/14/2024, CONSULTATION DATE:  1/23 REFERRING MD:  Lucas, CHIEF COMPLAINT:  post-LVAD   History of Present Illness:  Karen Warner is a 74 y/o woman with a history of ICM and HFrEF who presented on 1/14 with chest pain and decompensated heart failure. She underwent left and right heart catheterization this admission showing low CO and low filling pressures.  She was managed with inotropic support. Due to progression of disease and poor tolerance of GDMT, she was evaluated for LVAD.  Intraoperatively her course was uncomplicated other than low flows on LVAD when her BP went up at the end of the case when she was stimulated. She transferred to the ICU on epi 2, NE 1, milrinone  0.326mcg. TEE with normal RV function, evidence of intrapulmonary shunt.  Paralytics not reversed in ICU. Bypass time , crossclamp time 29 min, cell saver 225cc. LVAD speed 4600 RPM. Alarms on LVAD went off during periods of hypertension post-op, controlled by afterload reduction.   Pertinent  Medical History  HFrEF due to ICM DM2 IDA FM CKD3  Significant Hospital Events: Including procedures, antibiotic start and stop dates in addition to other pertinent events   1/14 admission 1/15 L&R heart cath> nonobstructive disease, low filling pressures 1/23 LVAD 1/24 extubated; fluid boluses & milrinone  increased due to frequent LVAD alarms with high PI 1/25 reintubated for respiratory distress, worsening lactic acidosis 1/26 Tmax 100.4 on vanc and zosyn , remains intubated  1/27 extubated, remains with labile blood pressure, worsening fever/tachycardia and hypoxia requiring BiPap and resumption of inhaled nitric, cultured and broadened to vanc and meropenem , some low flows associated with hypertension. Steroids added for fevers 1/28 stopped cleviprex > improved respiratory status (pulmonary shunting)  Interim History / Subjective:   Eating jello. Pain is better today since she could start oral pain meds. SOB is stable.   Objective    Blood pressure 108/75, pulse (!) 135, temperature 98.8 F (37.1 C), temperature source Bladder, resp. rate (!) 23, height 5' 5 (1.651 m), weight 78.6 kg, SpO2 99%. PAP: (22-40)/(9-27) 35/23 CVP:  [2 mmHg-18 mmHg] 14 mmHg CO:  [5.8 L/min-5.9 L/min] 5.8 L/min CI:  [3.2 L/min/m2-3.3 L/min/m2] 3.2 L/min/m2      Intake/Output Summary (Last 24 hours) at 12/29/2024 0910 Last data filed at 12/29/2024 0800 Gross per 24 hour  Intake 2604.46 ml  Output 2225 ml  Net 379.46 ml   Filed Weights   12/27/24 0500 12/28/24 0500 12/29/24 0530  Weight: 78.8 kg 77.8 kg 78.6 kg   Examination: General: elderly woman lying in bed in NAD HENT: [place]/AT, eyes anicteric Lungs: reduced basilar breath sounds, faint rhales Cardiovascular: hum fro LVAD, tachycardic on tele  Abdomen: soft, NT Extremities: mild edema, L radial Aline Neuro: awake, alert, moving all extremities. Answering questions appropriately  GU: foley draining clear yellow urine  I/O: +500cc 24 hours,m +6.7L/hospital course  Blood cultures> NGTD [place] 69% 7.46/31/99/22 on 8L   Resolved problem list   Assessment and Plan   Acute on chronic HFrEF due to ICM; NYHA class IV heart failure s/p LVAD on 1/23 -d/c swan & introducer today, getting PICC -inotropes per AHF, con't milrinone  0.383mcg -no cleviprex  or nicaridpine; using hydralazine  & PRN NTG for HTN control -goal MAP <90 -diuresis-- lasix  gtt increased to 15mg /h, adding metolazone  -wean iNO -heparin  gtt, warfarin dosing per pharmacy -stopping steroids; can resume if fevers return -sildenafil  20mg  TID -progress mobility -post op  care per TCTS; still has 1 chest tube  Acute respiratory failure with hypoxia; developed increased WOB in the setting of fever and tachycardia requiring BiPap and resumption of inhaled nitric oxide.  Previously had similar presentation resulting in  repeat intubation. Stopping cleviprex  seems to have resolved this-- suspect it was contributing to shunting through the lungs.  -wean iNO -diuresis -pulmonary hygiene, OOB mobility -avoid CCB for antihypertensives -con't brovana  & yupelri  -con't broad antibiotics to complete 7 days  Sinus tachycardia, even at rates of 130-160 appeared to have P waves on tele, ECG difficult to interpret given VAD artifact; likely related to fever & post-op status.  -amiodarone  per AHF  Fevers, leukocytosis; remains unclear if post-op inflammation versus infectious process. WBC increased today likely in the setting of steroids  -stopping steroids -con't broad antibiotics -avoiding precedex ; previously d/c several meds that could have contributed to fever  Hyperglycemia; h/o DM. A1c 6.6 -con't glargine 15 units daily -increase TF coverage to 6 units q4h -SSI PRN -stopping steroids will help  Anticipated acute blood loss anemia -monitor; transfuse for Hb <7 or hemodynamically significant bleeding  GERD -PPI daily  H/o migraine -stopped PTA ubrelvy    H/o FM -holding PTA trintellix  due to fever concern -mobilize, optimize sleep  Acute post-operative pain Anxiety; associated with BiPap mask> resolved -fentanyl  & oxycodone  PRN  CKD 3b --strict I/O -renally dose meds, avoid nephrotoxic meds  At risk for malnutrition  -advance diet as tolerated -increase TF to 30cc/h  Deconditioning -may need PT & OT  Plan discussed with AHF & TCTS.  Labs   CBC: Recent Labs  Lab 12/25/24 0414 12/25/24 0515 12/26/24 0454 12/26/24 0455 12/27/24 0519 12/27/24 9357 12/28/24 0435 12/28/24 0534 12/28/24 1238 12/28/24 1555 12/29/24 0329 12/29/24 0546  WBC 23.6*  --  21.0*  --  15.2*  --  17.4*  --   --   --  15.6*  --   NEUTROABS 19.9*  --  17.1*  --  11.2*  --  13.9*  --   --   --  11.9*  --   HGB 8.5*   < > 7.6*   < > 8.9*   < > 9.5* 9.9* 9.9* 9.5* 8.7* 9.2*  HCT 25.7*   < > 22.7*   < > 27.5*    < > 28.5* 29.0* 29.0* 28.0* 26.5* 27.0*  MCV 96.3  --  94.2  --  92.9  --  90.2  --   --   --  91.1  --   PLT 182  --  184  --  210  --  248  --   --   --  308  --    < > = values in this interval not displayed.    Basic Metabolic Panel: Recent Labs  Lab 12/26/24 0454 12/26/24 0455 12/27/24 0519 12/27/24 9357 12/27/24 0734 12/27/24 1848 12/27/24 1855 12/27/24 2014 12/28/24 0435 12/28/24 0534 12/28/24 1238 12/28/24 1547 12/28/24 1555 12/29/24 0329 12/29/24 0546  NA 138   < >  --    < > 143   < > 138   < > 138   < > 140 140  140 141 138 141  K 3.6   < >  --    < > 4.0   < > 3.0*   < > 3.6   < > 3.5 3.7  3.7 3.5 4.3 3.8  CL 108  --   --   --  112*  --  103  --  102  --   --  104  104  --  104  --   CO2 19*  --   --   --  21*  --  20*  --  23  --   --  22  22  --  23  --   GLUCOSE 231*  --   --   --  168*  --  263*  --  234*  --   --  208*  208*  --  254*  --   BUN 23  --   --   --  19  --  17  --  15  --   --  16  16  --  22  --   CREATININE 1.34*  --   --   --  1.03*  --  0.94  --  1.00  --   --  1.09*  1.05*  --  1.22*  --   CALCIUM  8.2*  --   --   --  8.3*  --  8.1*  --  8.6*  --   --  8.6*  8.6*  --  8.5*  --   MG 2.7*  --  2.3  --   --   --   --   --  1.8  --   --  2.2  --  2.3  --   PHOS 2.1*  --  1.8*  --   --   --   --   --  4.0  --   --  2.4*  --  2.2*  --    < > = values in this interval not displayed.   GFR: Estimated Creatinine Clearance: 41.9 mL/min (A) (by C-G formula based on SCr of 1.22 mg/dL (H)). Recent Labs  Lab 12/25/24 1051 12/25/24 1344 12/26/24 0454 12/27/24 0519 12/27/24 1855 12/28/24 0435 12/28/24 0810 12/28/24 0949 12/28/24 1234 12/29/24 0329  PROCALCITON  --   --   --   --  1.83  --  2.48  --   --   --   WBC  --   --  21.0* 15.2*  --  17.4*  --   --   --  15.6*  LATICACIDVEN 3.9* 1.7  --   --   --   --   --  1.4 1.9  --     Critical care time:      This patient is critically ill with multiple organ system failure which  requires frequent high complexity decision making, assessment, support, evaluation, and titration of therapies. This was completed through the application of advanced monitoring technologies and extensive interpretation of multiple databases. During this encounter critical care time was devoted to patient care services described in this note for 41 minutes.  Leita [name], DO 12/29/24 9:41 AM [facility]  For contact information, see Amion. If no response to pager, please call PCCM 2H APP. After hours, 7PM- 7AM, please call on call APP for 2H.     "

## 2024-12-30 ENCOUNTER — Inpatient Hospital Stay (HOSPITAL_COMMUNITY)

## 2024-12-30 ENCOUNTER — Ambulatory Visit

## 2024-12-30 DIAGNOSIS — I255 Ischemic cardiomyopathy: Secondary | ICD-10-CM | POA: Diagnosis not present

## 2024-12-30 DIAGNOSIS — E1122 Type 2 diabetes mellitus with diabetic chronic kidney disease: Secondary | ICD-10-CM | POA: Diagnosis not present

## 2024-12-30 DIAGNOSIS — D62 Acute posthemorrhagic anemia: Secondary | ICD-10-CM | POA: Diagnosis not present

## 2024-12-30 DIAGNOSIS — N1832 Chronic kidney disease, stage 3b: Secondary | ICD-10-CM | POA: Diagnosis not present

## 2024-12-30 DIAGNOSIS — Z794 Long term (current) use of insulin: Secondary | ICD-10-CM | POA: Diagnosis not present

## 2024-12-30 DIAGNOSIS — D72829 Elevated white blood cell count, unspecified: Secondary | ICD-10-CM | POA: Diagnosis not present

## 2024-12-30 DIAGNOSIS — J9601 Acute respiratory failure with hypoxia: Secondary | ICD-10-CM | POA: Diagnosis not present

## 2024-12-30 DIAGNOSIS — I5023 Acute on chronic systolic (congestive) heart failure: Secondary | ICD-10-CM | POA: Diagnosis not present

## 2024-12-30 DIAGNOSIS — E1165 Type 2 diabetes mellitus with hyperglycemia: Secondary | ICD-10-CM | POA: Diagnosis not present

## 2024-12-30 DIAGNOSIS — K219 Gastro-esophageal reflux disease without esophagitis: Secondary | ICD-10-CM | POA: Diagnosis not present

## 2024-12-30 DIAGNOSIS — I130 Hypertensive heart and chronic kidney disease with heart failure and stage 1 through stage 4 chronic kidney disease, or unspecified chronic kidney disease: Secondary | ICD-10-CM | POA: Diagnosis not present

## 2024-12-30 DIAGNOSIS — Z8669 Personal history of other diseases of the nervous system and sense organs: Secondary | ICD-10-CM | POA: Diagnosis not present

## 2024-12-30 LAB — CBC WITH DIFFERENTIAL/PLATELET
Abs Immature Granulocytes: 0.17 10*3/uL — ABNORMAL HIGH (ref 0.00–0.07)
Basophils Absolute: 0 10*3/uL (ref 0.0–0.1)
Basophils Relative: 0 %
Eosinophils Absolute: 0.3 10*3/uL (ref 0.0–0.5)
Eosinophils Relative: 2 %
HCT: 28 % — ABNORMAL LOW (ref 36.0–46.0)
Hemoglobin: 9.1 g/dL — ABNORMAL LOW (ref 12.0–15.0)
Immature Granulocytes: 1 %
Lymphocytes Relative: 13 %
Lymphs Abs: 2.5 10*3/uL (ref 0.7–4.0)
MCH: 29.9 pg (ref 26.0–34.0)
MCHC: 32.5 g/dL (ref 30.0–36.0)
MCV: 92.1 fL (ref 80.0–100.0)
Monocytes Absolute: 1.8 10*3/uL — ABNORMAL HIGH (ref 0.1–1.0)
Monocytes Relative: 10 %
Neutro Abs: 14.1 10*3/uL — ABNORMAL HIGH (ref 1.7–7.7)
Neutrophils Relative %: 74 %
Platelets: 378 10*3/uL (ref 150–400)
RBC: 3.04 MIL/uL — ABNORMAL LOW (ref 3.87–5.11)
RDW: 17.5 % — ABNORMAL HIGH (ref 11.5–15.5)
WBC: 19 10*3/uL — ABNORMAL HIGH (ref 4.0–10.5)
nRBC: 0.9 % — ABNORMAL HIGH (ref 0.0–0.2)

## 2024-12-30 LAB — BASIC METABOLIC PANEL WITH GFR
Anion gap: 12 (ref 5–15)
Anion gap: 12 (ref 5–15)
BUN: 34 mg/dL — ABNORMAL HIGH (ref 8–23)
BUN: 36 mg/dL — ABNORMAL HIGH (ref 8–23)
CO2: 28 mmol/L (ref 22–32)
CO2: 26 mmol/L (ref 22–32)
Calcium: 9.1 mg/dL (ref 8.9–10.3)
Calcium: 9.2 mg/dL (ref 8.9–10.3)
Chloride: 102 mmol/L (ref 98–111)
Chloride: 98 mmol/L (ref 98–111)
Creatinine, Ser: 1.28 mg/dL — ABNORMAL HIGH (ref 0.44–1.00)
Creatinine, Ser: 1.3 mg/dL — ABNORMAL HIGH (ref 0.44–1.00)
GFR, Estimated: 44 mL/min — ABNORMAL LOW
GFR, Estimated: 43 mL/min — ABNORMAL LOW
Glucose, Bld: 144 mg/dL — ABNORMAL HIGH (ref 70–99)
Glucose, Bld: 471 mg/dL — ABNORMAL HIGH (ref 70–99)
Potassium: 3.5 mmol/L (ref 3.5–5.1)
Potassium: 5.1 mmol/L (ref 3.5–5.1)
Sodium: 142 mmol/L (ref 135–145)
Sodium: 136 mmol/L (ref 135–145)

## 2024-12-30 LAB — RENAL FUNCTION PANEL
Albumin: 3.4 g/dL — ABNORMAL LOW (ref 3.5–5.0)
Anion gap: 14 (ref 5–15)
BUN: 32 mg/dL — ABNORMAL HIGH (ref 8–23)
CO2: 28 mmol/L (ref 22–32)
Calcium: 8.8 mg/dL — ABNORMAL LOW (ref 8.9–10.3)
Chloride: 100 mmol/L (ref 98–111)
Creatinine, Ser: 1.25 mg/dL — ABNORMAL HIGH (ref 0.44–1.00)
GFR, Estimated: 45 mL/min — ABNORMAL LOW
Glucose, Bld: 150 mg/dL — ABNORMAL HIGH (ref 70–99)
Phosphorus: 3.2 mg/dL (ref 2.5–4.6)
Potassium: 2.6 mmol/L — CL (ref 3.5–5.1)
Sodium: 142 mmol/L (ref 135–145)

## 2024-12-30 LAB — GLUCOSE, CAPILLARY
Glucose-Capillary: 133 mg/dL — ABNORMAL HIGH (ref 70–99)
Glucose-Capillary: 139 mg/dL — ABNORMAL HIGH (ref 70–99)
Glucose-Capillary: 141 mg/dL — ABNORMAL HIGH (ref 70–99)
Glucose-Capillary: 145 mg/dL — ABNORMAL HIGH (ref 70–99)
Glucose-Capillary: 156 mg/dL — ABNORMAL HIGH (ref 70–99)
Glucose-Capillary: 164 mg/dL — ABNORMAL HIGH (ref 70–99)
Glucose-Capillary: 237 mg/dL — ABNORMAL HIGH (ref 70–99)
Glucose-Capillary: 247 mg/dL — ABNORMAL HIGH (ref 70–99)
Glucose-Capillary: 282 mg/dL — ABNORMAL HIGH (ref 70–99)
Glucose-Capillary: 289 mg/dL — ABNORMAL HIGH (ref 70–99)
Glucose-Capillary: 324 mg/dL — ABNORMAL HIGH (ref 70–99)
Glucose-Capillary: 373 mg/dL — ABNORMAL HIGH (ref 70–99)
Glucose-Capillary: 392 mg/dL — ABNORMAL HIGH (ref 70–99)
Glucose-Capillary: 449 mg/dL — ABNORMAL HIGH (ref 70–99)

## 2024-12-30 LAB — MAGNESIUM: Magnesium: 2.1 mg/dL (ref 1.7–2.4)

## 2024-12-30 LAB — COOXEMETRY PANEL
Carboxyhemoglobin: 1.8 % — ABNORMAL HIGH (ref 0.5–1.5)
Methemoglobin: <0.7 % (ref 0.0–1.5)
O2 Saturation: 68 %
Total hemoglobin: 9.3 g/dL — ABNORMAL LOW (ref 12.0–16.0)

## 2024-12-30 LAB — HEPARIN LEVEL (UNFRACTIONATED): Heparin Unfractionated: 0.1 [IU]/mL — ABNORMAL LOW (ref 0.30–0.70)

## 2024-12-30 LAB — LACTATE DEHYDROGENASE: LDH: 640 U/L — ABNORMAL HIGH (ref 105–235)

## 2024-12-30 LAB — PROTIME-INR
INR: 2 — ABNORMAL HIGH (ref 0.8–1.2)
Prothrombin Time: 23.6 s — ABNORMAL HIGH (ref 11.4–15.2)

## 2024-12-30 LAB — PRO BRAIN NATRIURETIC PEPTIDE: Pro Brain Natriuretic Peptide: 14579 pg/mL — ABNORMAL HIGH

## 2024-12-30 MED ORDER — POTASSIUM CHLORIDE 20 MEQ PO PACK
40.0000 meq | PACK | ORAL | Status: AC
  Administered 2024-12-30 (×2): 40 meq
  Filled 2024-12-30 (×2): qty 2

## 2024-12-30 MED ORDER — INSULIN REGULAR(HUMAN) IN NACL 100-0.9 UT/100ML-% IV SOLN
INTRAVENOUS | Status: DC
  Administered 2024-12-30: 5 [IU]/h via INTRAVENOUS
  Administered 2024-12-31: 4.6 [IU]/h via INTRAVENOUS
  Administered 2024-12-31: 2.6 [IU]/h via INTRAVENOUS
  Filled 2024-12-30 (×3): qty 100

## 2024-12-30 MED ORDER — POTASSIUM CHLORIDE 10 MEQ/50ML IV SOLN
10.0000 meq | INTRAVENOUS | Status: AC
  Administered 2024-12-30 (×2): 10 meq via INTRAVENOUS
  Filled 2024-12-30 (×2): qty 50

## 2024-12-30 MED ORDER — METOLAZONE 5 MG PO TABS
5.0000 mg | ORAL_TABLET | Freq: Once | ORAL | Status: AC
  Administered 2024-12-30: 5 mg via ORAL
  Filled 2024-12-30: qty 1

## 2024-12-30 MED ORDER — INSULIN GLARGINE-YFGN 100 UNIT/ML ~~LOC~~ SOLN
20.0000 [IU] | Freq: Two times a day (BID) | SUBCUTANEOUS | Status: DC
  Administered 2024-12-30: 20 [IU] via SUBCUTANEOUS
  Filled 2024-12-30 (×2): qty 0.2

## 2024-12-30 MED ORDER — FUROSEMIDE 10 MG/ML IJ SOLN
120.0000 mg | Freq: Once | INTRAVENOUS | Status: AC
  Administered 2024-12-30: 120 mg via INTRAVENOUS
  Filled 2024-12-30: qty 10

## 2024-12-30 MED ORDER — INSULIN ASPART 100 UNIT/ML IJ SOLN
0.0000 [IU] | INTRAMUSCULAR | Status: DC
  Administered 2024-12-30: 7 [IU] via SUBCUTANEOUS
  Filled 2024-12-30: qty 7

## 2024-12-30 MED ORDER — WARFARIN SODIUM 2.5 MG PO TABS
2.5000 mg | ORAL_TABLET | Freq: Once | ORAL | Status: AC
  Administered 2024-12-30: 2.5 mg via ORAL
  Filled 2024-12-30: qty 1

## 2024-12-30 MED ORDER — INSULIN ASPART 100 UNIT/ML IJ SOLN
8.0000 [IU] | INTRAMUSCULAR | Status: DC
  Administered 2024-12-30: 8 [IU] via SUBCUTANEOUS
  Filled 2024-12-30: qty 8

## 2024-12-30 MED ORDER — EPINEPHRINE HCL 5 MG/250ML IV SOLN IN NS
0.5000 ug/min | INTRAVENOUS | Status: DC
  Administered 2025-01-02: 0.5 ug/min via INTRAVENOUS
  Filled 2024-12-30: qty 250

## 2024-12-30 MED ORDER — PIVOT 1.5 CAL PO LIQD
1000.0000 mL | ORAL | Status: DC
  Administered 2024-12-30: 1000 mL

## 2024-12-30 MED ORDER — POTASSIUM CHLORIDE 10 MEQ/100ML IV SOLN: 10.0000 meq | INTRAVENOUS | Status: DC

## 2024-12-30 MED ORDER — METOLAZONE 5 MG PO TABS: 5.0000 mg | ORAL_TABLET | Freq: Two times a day (BID) | ORAL | Status: DC

## 2024-12-30 MED ORDER — POTASSIUM CHLORIDE 20 MEQ PO PACK
80.0000 meq | PACK | Freq: Once | ORAL | Status: AC
  Administered 2024-12-30: 80 meq
  Filled 2024-12-30: qty 4

## 2024-12-30 NOTE — Progress Notes (Signed)
 Nutrition Brief Note    Tube Feeding via Cortrak:  Pivot 1.5 increased to 40 ml/hr today per MD  Goal: Pivot 1.5 at 50 ml/hr TF at goal provides 113 g of protein, 1800 kcals and 912 mL of free water  Insulin  drip started 1615 Pt required KCl runs  O2: L [place]; SOB today Refused to walk   Refeeding Labs: Recent Labs  Lab 12/28/24 1547 12/28/24 1555 12/29/24 0329 12/29/24 0546 12/30/24 0348 12/30/24 0818 12/30/24 1400  K 3.7  3.7   < > 4.3   < > 2.6* 3.5 5.1  MG 2.2  --  2.3  --  2.1  --   --   PHOS 2.4*  --  2.2*  --  3.2  --   --    < > = values in this interval not displayed.   Glucose Profile:  Recent Labs    12/30/24 0810 12/30/24 1101 12/30/24 1522  GLUCAP 145* 247* 373*     Tarrence Enck P., RD, LDN, CNSC See AMiON for contact information

## 2024-12-30 NOTE — Progress Notes (Signed)
 This chaplain is present with the Pt. for F/U spiritual care. The Pt. is participating in medical care with the RN at the time of the visit. The Pt. has a visitor sitting in the corner of the room.  The Pt. is awake and acknowledges the chaplain's presence. The Pt. speaks a few words reflecting the difficulty of the day. Based on the previous day's visit, the chaplain offers words of encouragement which were accepted by the Pt.    The chaplain provided education on how to request spiritual care visit through the RN as needed. This chaplain will F/U next week.  Chaplain Leeroy Hummer [phone]

## 2024-12-30 NOTE — Procedures (Signed)
 POCUS       Bedside POCUS  The right pleural space was evaluated. There was no Pleural effusion + b lines (picture did not get recorded)  On the left side there was a moderate size pleural effusion. Pictured above. The top showing better idea of volume. Note septations noted in the top.   Impression Mod sized left sided pleural effusion.Do not think this volume of pleural fluid is significantly impacting her WOB. More inclined to believe pulmonary edema + some degree of anxiety give her WOB increases out of proportion to activity (became more SOB while undergoing POCUS in the lying position which required her to only move her arm w/ my assist)   -have updated adv HF on findings.   Plan Cont Diuresis Will discuss findings w/ cardiac surgery. (Suspect this may not be a big surprise) but if they feel that the septations warrant further investigation then could consider left thoracentesis. Again doubt it would improve WOB much but if the septation reflects infection and not post-operative inflammation might be needed  Will cont to watch radiographically and also via POCUS (depending on cardiac surgeries thoughts)

## 2024-12-30 NOTE — Progress Notes (Signed)
 "  NAME:  Karen Warner, MRN:  [id number], DOB:  06-03-1951, LOS: 15 ADMISSION DATE:  12/14/2024, CONSULTATION DATE:  1/23 REFERRING MD:  Lucas, CHIEF COMPLAINT:  post-LVAD   History of Present Illness:  [name] is a 74 y/o woman with a history of ICM and HFrEF who presented on 1/14 with chest pain and decompensated heart failure. She underwent left and right heart catheterization this admission showing low CO and low filling pressures.  She was managed with inotropic support. Due to progression of disease and poor tolerance of GDMT, she was evaluated for LVAD.  Intraoperatively her course was uncomplicated other than low flows on LVAD when her BP went up at the end of the case when she was stimulated. She transferred to the ICU on epi 2, NE 1, milrinone  0.317mcg. TEE with normal RV function, evidence of intrapulmonary shunt.  Paralytics not reversed in ICU. Bypass time , crossclamp time 29 min, cell saver 225cc. LVAD speed 4600 RPM. Alarms on LVAD went off during periods of hypertension post-op, controlled by afterload reduction.   Pertinent  Medical History  HFrEF due to ICM DM2 IDA FM CKD3  Significant Hospital Events: Including procedures, antibiotic start and stop dates in addition to other pertinent events   1/14 admission 1/15 L&R heart cath> nonobstructive disease, low filling pressures 1/23 LVAD 1/24 extubated; fluid boluses & milrinone  increased due to frequent LVAD alarms with high PI 1/25 reintubated for respiratory distress, worsening lactic acidosis 1/26 Tmax 100.4 on vanc and zosyn , remains intubated  1/27 extubated, remains with labile blood pressure, worsening fever/tachycardia and hypoxia requiring BiPap and resumption of inhaled nitric, cultured and broadened to vanc and meropenem , some low flows associated with hypertension. Steroids added for fevers 1/28 stopped cleviprex > improved respiratory status (pulmonary shunting) 1/29 Eating jello. Pain is  better today since she could start oral pain meds. SOB is stable.   Interim History / Subjective:  Refused to walk today  WOB a little worse  Currently on 5 lpm   Objective    Blood pressure (!) 127/110, pulse (!) 120, temperature (!) 97.3 F (36.3 C), resp. rate (!) 22, height 5' 5 (1.651 m), weight 78.8 kg, SpO2 94%. PAP: (38-51)/(27-31) 38/27 CVP:  [0 mmHg-15 mmHg] 11 mmHg      Intake/Output Summary (Last 24 hours) at 12/30/2024 1122 Last data filed at 12/30/2024 1100 Gross per 24 hour  Intake 2614.77 ml  Output 5020 ml  Net -2405.23 ml   Filed Weights   12/28/24 0500 12/29/24 0530 12/30/24 0600  Weight: 77.8 kg 78.6 kg 78.8 kg   Examination: General 74 year old female patient lying in bed no acute distress HEENT normocephalic atraumatic no jugular venous distention Pulmonary decreased bases, some scattered rales.  Currently 5 L/min, using accessory muscles.  Talking in short phrases.  Portable chest x-ray personally reviewed worsening diffuse pulmonary edema Cardiac currently on HeartMate, cardiac, noted.  Pulsatility decreased with increased HeartMate flow Abdomen still having diarrhea Extremities warm dry Neuro intact  Resolved problem list   Assessment and Plan   Acute on chronic HFrEF due to ICM; NYHA class IV heart failure s/p LVAD on 1/23 Plan Continuing IV diuresis with Lasix  and metolazone .  See advanced heart failure Continuing low-dose epinephrine  for RV support, currently off nitroglycerin  Oral hydralazine  Continue sildenafil  Removing arterial line today Continuing Coumadin  with bridging heparin , once INR greater than 2 stop heparin  Continue to trend Co. oximetry Heart failure decreasing milrinone  today to 0.25 Need to mobilize as tolerated  Avoid additional calcium  channel blockers Chest tube management per surgical Team Postoperative analgesia, trying to cut back on this now  Acute respiratory failure with hypoxia;  Worsening pulmonary edema on  chest x-ray Plan Continuing diuresis Continued schedule antibiotics for possible pulmonary infection although doubt Continue scheduled bronchodilators with Brovana  and Yupelri  Avoiding calcium  channel blocker, there is concern about this causing shunt effect hypoxia, although now this seems less likely with the pulmonary edema  Sinus tachycardia, even at rates of 130-160 appeared to have P waves on tele, ECG difficult to interpret given VAD artifact; likely related to fever & post-op status.  Plan Amiodarone  per heart failure  Fevers, leukocytosis.  Tmax 99.1, white blood cells still elevated Stop steroids yesterday Plan Continue to trend fever and white blood cell curve Broad-spectrum antibiotics for 7 days, currently on meropenem  and vancomycin  with stop date placed avoiding precedex ; previously d/c several meds that could have contributed to fever  Hyperglycemia; h/o DM. A1c 6.6 Off IV insulin  again Plan Lantus  increased to 20 twice daily Tube feed coverage increased to 8 every 4 Steroids stopped yesterday Resistant sliding scale  Anticipated acute blood loss anemia Plan monitor; transfuse for Hb <7 or hemodynamically significant bleeding  GERD Plan  PPI daily  H/o migraine Plan  stopped PTA ubrelvy    H/o FM Plan  holding PTA trintellix  due to fever concern mobilize, optimize sleep  CKD 3b Plan  Renal dose meds  Avoid nephrotoxins  Strict intake output  A.m. chemistry   At risk for malnutrition  Plan advance diet as tolerated increase TF to 40cc/h (goal 50 cc/hr)  Deconditioning -may need PT & OT    Critical care time:   I personally  spent 32 minutes  on this patient which included: review of medical records, nursing notes, progress notes, evaluation, interpretation of lab data and diagnostic studies, taking independent history, performing exam, documenting plan, ordering diagnostics and interventions for the following critical care issues: Acute  respiratory failure with the following interventions which included: prevention of further deterioration         "

## 2024-12-30 NOTE — Evaluation (Signed)
 Occupational Therapy Re-Evaluation Patient Details Name: Karen Warner MRN: [id number] DOB: 1950-12-31 Today's Date: 12/30/2024   History of Present Illness   74 yo F presented to hospital with anterior chest pain. She states this pain is similar to her previous CAD episodes. Underwent heart cath 12/15/2024 and now advanced heart failure and LVAD coordinators consulted. S/p HM3 LVAD placement 1/23 and extubated s/p surgery on 1/24. Re-intubated 1/25-1/27 secondary to respiratory failure. PHMx:CAD, COPD (quit smoking 2021). Ischemic CM with EF < 20%, ICD placed. CHF, chronic low back pain, CKD3, DM2, DDD-cervical, fibromyalgia, MI, peripheral neuropathy, PVD, retinopathy and glaucoma     Clinical Impressions Karen Warner was re-evaluated s/p LVAD placement. She is indep at baseline. Upon evaluation the pt was limited by pain, discomfort, BUE weakness, impaired cognition and limited activity tolerance. Overall she currently needs total A +2 for all aspects of her care at bed level. Pt will benefit from continued acute OT services and intensive inpatient follow up therapy, >3 hours/day after discharge.       If plan is discharge home, recommend the following:   Assistance with cooking/housework;Assist for transportation;Help with stairs or ramp for entrance;A lot of help with walking and/or transfers;Two people to help with walking and/or transfers;A lot of help with bathing/dressing/bathroom;Two people to help with bathing/dressing/bathroom     Functional Status Assessment   Patient has had a recent decline in their functional status and demonstrates the ability to make significant improvements in function in a reasonable and predictable amount of time.      Recommendations for Other Services   Rehab consult     Precautions/Restrictions   Precautions Precautions: Fall;Sternal Recall of Precautions/Restrictions: Intact Precaution/Restrictions Comments: LVAD,   foley Restrictions Weight Bearing Restrictions Per Provider Order: No Other Position/Activity Restrictions: sternal precautions     Mobility Bed Mobility Overal bed mobility: Needs Assistance             General bed mobility comments: total A  for scooting up in bed    Transfers                          Balance                                           ADL either performed or assessed with clinical judgement   ADL Overall ADL's : Needs assistance/impaired                                       General ADL Comments: total A for all aspects of care at bed level     Vision Baseline Vision/History: 0 No visual deficits Vision Assessment?: No apparent visual deficits     Perception Perception: Within Functional Limits       Praxis Praxis: WFL       Pertinent Vitals/Pain Pain Assessment Pain Assessment: Faces Faces Pain Scale: Hurts even more Pain Location: abdomen Pain Descriptors / Indicators: Discomfort, Grimacing, Guarding     Extremity/Trunk Assessment Upper Extremity Assessment Upper Extremity Assessment: RUE deficits/detail;LUE deficits/detail RUE Deficits / Details: question effot. very poor grip strength. minimal movement against gravity LUE Deficits / Details: question effot. very poor grip strength. minimal movement against gravity   Lower Extremity Assessment Lower Extremity Assessment: Defer to PT  evaluation   Cervical / Trunk Assessment Cervical / Trunk Assessment: Other exceptions Cervical / Trunk Exceptions: LVAD   Communication Communication Communication: No apparent difficulties   Cognition Arousal: Lethargic Behavior During Therapy: WFL for tasks assessed/performed Cognition: Cognition impaired   Orientation impairments: Situation Awareness: Online awareness impaired Memory impairment (select all impairments): Non-declarative long-term memory, Working memory Attention impairment  (select first level of impairment): Sustained attention Executive functioning impairment (select all impairments): Sequencing, Reasoning, Problem solving OT - Cognition Comments: self-limiting due to pain and discomfort. flat affect. did recall LVAD education that was provided                 Following commands: Intact       Cueing  General Comments   Cueing Techniques: Verbal cues;Gestural cues  VS on 5L O2 via [place], increased work of breathing throughout session, PT/OT provided education on LVAD and power switch, with cuing pt is able to identify her drive line, forgets control panel and does not know about batteries.   Exercises     Shoulder Instructions      Home Living Family/patient expects to be discharged to:: Private residence Living Arrangements: Children;Other relatives Available Help at Discharge: Family;Available 24 hours/day;Friend(s) Type of Home: House Home Access: Stairs to enter [employer] of Steps: 6 Entrance Stairs-Rails: Left;Right;Can reach both Home Layout: One level     Bathroom Shower/Tub: Walk-in shower;Door   [employer] Toilet: Standard     Home Equipment: Agricultural Consultant (2 wheels);Rollator (4 wheels);Shower seat          Prior Functioning/Environment Prior Level of Function : Independent/Modified Independent             Mobility Comments: indep without AD ADLs Comments: indep    OT Problem List: Decreased strength;Decreased activity tolerance;Impaired balance (sitting and/or standing)   OT Treatment/Interventions: Self-care/ADL training;DME and/or AE instruction;Balance training;Patient/family education      OT Goals(Current goals can be found in the care plan section)   Acute Rehab OT Goals Patient Stated Goal: less pain OT Goal Formulation: With patient Time For Goal Achievement: 01/13/25 Potential to Achieve Goals: Good   OT Frequency:  Min 2X/week    Co-evaluation PT/OT/SLP Co-Evaluation/Treatment:  Yes Reason for Co-Treatment: Complexity of the patient's impairments (multi-system involvement);For patient/therapist safety;To address functional/ADL transfers   OT goals addressed during session: ADL's and self-care      AM-PAC OT 6 Clicks Daily Activity     Outcome Measure Help from another person eating meals?: Total Help from another person taking care of personal grooming?: Total Help from another person toileting, which includes using toliet, bedpan, or urinal?: Total Help from another person bathing (including washing, rinsing, drying)?: Total Help from another person to put on and taking off regular upper body clothing?: Total Help from another person to put on and taking off regular lower body clothing?: Total 6 Click Score: 6   End of Session    Activity Tolerance: Patient limited by fatigue Patient left: in bed;with call bell/phone within reach;with bed alarm set  OT Visit Diagnosis: Unsteadiness on feet (R26.81);Muscle weakness (generalized) (M62.81)                Time: 1410-1433 OT Time Calculation (min): 23 min Charges:  OT General Charges $OT Visit: 1 Visit OT Evaluation $OT Eval Moderate Complexity: 1 Mod  Lucie Kendall, OTR/L Acute Rehabilitation Services Office [phone] Secure Chat Communication Preferred   Lucie JONETTA Kendall 12/30/2024, 4:25 PM

## 2024-12-30 NOTE — Progress Notes (Signed)
 Spoke to surgery They agree no thoracentesis indicated  Karen Warner Banner ACNP-BC [facility] Pulmonary/Critical Care Pager # [phone] OR # [phone] if no answer

## 2024-12-30 NOTE — Progress Notes (Signed)
" ° °  [address], [place] [address]             [phone]   Being bathed currently  BP (!) 84/74   Pulse (!) 113   Temp 98.8 F (37.1 C) (Bladder)   Resp (!) 25   Ht 5' 5 (1.651 m)   Wt 78.8 kg   SpO2 94%   BMI 28.91 kg/m    Intake/Output Summary (Last 24 hours) at 12/30/2024 1700 Last data filed at 12/30/2024 1600 Gross per 24 hour  Intake 2349.36 ml  Output 5350 ml  Net -3000.64 ml   No new issues  Elspeth C. Kerrin, MD [facility] [phone]   "

## 2024-12-30 NOTE — Progress Notes (Signed)
 HeartMate 3 Rounding Note  Subjective:    Feels fatigued. She is sitting up in chair this am. Reportedly standing ok and weighed standing up. Has not walked yet. Pain under control.  Milrinone  0.375, epi 0.5,  Co-ox 68, CVP 4-5.  -1721/24 hrs. Wt unchanged, about 10 lbs over preop.  LVAD INTERROGATION:  HeartMate IIl LVAD:  Flow 4.5 liters/min, speed 5100, power 3, PI 3.    Objective:    Vital Signs:   Temp:  [97.9 F (36.6 C)-99.1 F (37.3 C)] 99.1 F (37.3 C) (01/30 0600) Pulse Rate:  [112-148] 112 (01/30 0600) Resp:  [16-26] 24 (01/30 0600) SpO2:  [94 %-100 %] 96 % (01/30 0600) Arterial Line BP: (69-146)/(54-83) 69/57 (01/30 0600) Weight:  [78.8 kg] 78.8 kg (01/30 0600) Last BM Date : 12/29/24 Mean arterial Pressure 70's-80's  Intake/Output:   Intake/Output Summary (Last 24 hours) at 12/30/2024 0714 Last data filed at 12/30/2024 0600 Gross per 24 hour  Intake 2676.28 ml  Output 4515 ml  Net -1838.72 ml     Physical Exam: General:  Looks fatigued. No resp difficulty HEENT: normal Neck: normal Cor: Distant heart sounds with LVAD hum present. Lungs: clear Abdomen: soft, nontender, nondistended. Good bowel sounds. Extremities: no edema Neuro: alert & orientedx3, cranial nerves grossly intact. moves all 4 extremities w/o difficulty. Affect pleasant  Telemetry: internally paced 120. Sensing A, pacing V.  Labs: Basic Metabolic Panel: Recent Labs  Lab 12/27/24 0519 12/27/24 9357 12/27/24 1855 12/27/24 2014 12/28/24 0435 12/28/24 0534 12/28/24 1547 12/28/24 1555 12/29/24 0329 12/29/24 0546 12/30/24 0348  NA  --    < > 138   < > 138   < > 140  140 141 138 141 142  K  --    < > 3.0*   < > 3.6   < > 3.7  3.7 3.5 4.3 3.8 2.6*  CL  --    < > 103  --  102  --  104  104  --  104  --  100  CO2  --    < > 20*  --  23  --  22  22  --  23  --  28  GLUCOSE  --    < > 263*  --  234*  --  208*  208*  --  254*  --  150*  BUN  --    < > 17  --  15  --  16  16  --   22  --  32*  CREATININE  --    < > 0.94  --  1.00  --  1.09*  1.05*  --  1.22*  --  1.25*  CALCIUM   --    < > 8.1*  --  8.6*  --  8.6*  8.6*  --  8.5*  --  8.8*  MG 2.3  --   --   --  1.8  --  2.2  --  2.3  --  2.1  PHOS 1.8*  --   --   --  4.0  --  2.4*  --  2.2*  --  3.2   < > = values in this interval not displayed.    Liver Function Tests: Recent Labs  Lab 12/24/24 0406 12/25/24 0414 12/26/24 0454 12/28/24 0810 12/28/24 1547 12/30/24 0348  AST 117* 123* 77* 39  --   --   ALT 12 11 8 6   --   --   ALKPHOS 52  61 68 98  --   --   BILITOT 0.6 1.6* 0.6 0.5  --   --   PROT 5.9* 6.1* 5.6* 6.6  --   --   ALBUMIN  3.8 3.9 3.4* 3.6 3.5 3.4*   No results for input(s): LIPASE, AMYLASE in the last 168 hours. No results for input(s): AMMONIA in the last 168 hours.  CBC: Recent Labs  Lab 12/26/24 0454 12/26/24 0455 12/27/24 0519 12/27/24 9357 12/28/24 0435 12/28/24 0534 12/28/24 1238 12/28/24 1555 12/29/24 0329 12/29/24 0546 12/30/24 0348  WBC 21.0*  --  15.2*  --  17.4*  --   --   --  15.6*  --  19.0*  NEUTROABS 17.1*  --  11.2*  --  13.9*  --   --   --  11.9*  --  14.1*  HGB 7.6*   < > 8.9*   < > 9.5*   < > 9.9* 9.5* 8.7* 9.2* 9.1*  HCT 22.7*   < > 27.5*   < > 28.5*   < > 29.0* 28.0* 26.5* 27.0* 28.0*  MCV 94.2  --  92.9  --  90.2  --   --   --  91.1  --  92.1  PLT 184  --  210  --  248  --   --   --  308  --  378   < > = values in this interval not displayed.    INR: Recent Labs  Lab 12/26/24 0454 12/27/24 0519 12/28/24 0435 12/29/24 0329 12/30/24 0348  INR 1.6* 1.3* 1.4* 1.6* 2.0*    Other results: EKG:   Imaging: US  EKG SITE RITE Result Date: 12/29/2024 If Site Rite image not attached, placement could not be confirmed due to current cardiac rhythm.  DG Abd Portable 1V Result Date: 12/28/2024 CLINICAL DATA:  Feeding tube placement. EXAM: DG ABD PORTABLE 1V COMPARISON:  12/25/2024. FINDINGS: Nasogastric tube has been removed. Feeding tube terminates  in the body of the stomach. IMPRESSION: Feeding tube terminates in the body of the stomach. Electronically Signed   By: Newell Eke M.D.   On: 12/28/2024 17:19     Medications:     Scheduled Medications:  arformoterol   15 mcg Nebulization BID   Chlorhexidine  Gluconate Cloth  6 each Topical Daily   docusate  100 mg Per Tube Daily   feeding supplement (PIVOT 1.5 CAL)  1,000 mL Per Tube Q24H   gabapentin   200 mg Per Tube QHS   hydrALAZINE   50 mg Per Tube Q8H   insulin  aspart  0-20 Units Subcutaneous Q4H   insulin  aspart  8 Units Subcutaneous Q4H   insulin  glargine-yfgn  20 Units Subcutaneous BID   lidocaine   2 patch Transdermal Q24H   mouth rinse  15 mL Mouth Rinse 4 times per day   pantoprazole  (PROTONIX ) IV  40 mg Intravenous Q24H   polyethylene glycol  17 g Per Tube Daily   revefenacin   175 mcg Nebulization Daily   sildenafil   20 mg Per Tube TID   sodium chloride  flush  10-40 mL Intracatheter Q12H   sodium chloride  flush  3 mL Intravenous Q12H   Vitamin D  (Ergocalciferol )  50,000 Units Oral Q Sun   Warfarin - Pharmacist Dosing Inpatient   Does not apply q1600    Infusions:  amiodarone  30 mg/hr (12/30/24 0600)   epinephrine  0.5 mcg/min (12/30/24 0600)   furosemide  (LASIX ) 200 mg in dextrose  5 % 100 mL (2 mg/mL) infusion 15 mg/hr (12/30/24 0600)  insulin  3.4 Units/hr (12/30/24 0600)   meropenem  (MERREM ) IV Stopped (12/29/24 2229)   milrinone  0.375 mcg/kg/min (12/30/24 0600)   potassium chloride  10 mEq (12/30/24 9360)   vancomycin  Stopped (12/29/24 1617)    PRN Medications: dextrose , fentaNYL  (SUBLIMAZE ) injection, hydrALAZINE , LORazepam , ondansetron  (ZOFRAN ) IV, mouth rinse, oxyCODONE , sodium chloride  flush, sodium chloride  flush   Assessment:   POD 7 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 Normal RV. Preop LVEF 20-25%. She has CRT-D  device and Barostim.   CAD with stable coronary anatomy per recent cath.   COPD with previous  smoking until 2021.   Stage 3 CKD with creat around 1.5-1.6 preop.   Preop RUQ and epigastric abdominal pain with no evidence of acute cholecystitis on US  . Seen by GI. Felt to possibly be related to low CO.   Obesity.    Acute postop respiratory failure requiring reintubation over the weekend. Appeared to be developing sepsis with fever, leukocytosis, increased lactic acid. Started on vanc and Zosyn  with quick improvement in lactic acid. Temp curve improving with Tmax 99.1 yesterday.  On Merrem  and Vanc. BC neative so far. This could just be SIRS early postop. Has foley and arterial line from OR.  She received some steroids. WBC up a bit since which could be related to that.    Acute postop blood loss anemia improved with transfusion and stable.  Plan/Discussion:    Hemodynamics stable. Inotrope wean per AHF team. Would like to remove arterial line POD 7 since we are not using it and it is a source of infection. Will simplify mobilization.  Wt is 10 lbs over preop but she has no edema and CVP low. I am not sure how accurate the wt is with all of the stuff hanging on her.   INR therapeutic. DC heparin . Coumadin  per pharmacy.  Start ambulation.  I reviewed the LVAD parameters from today, and compared the results to the patient's prior recorded data.  No programming changes were made.  The LVAD is functioning within specified parameters.    Length of Stay: [address]  Dorise POUR [facility] 12/30/2024, 7:14 AM

## 2024-12-30 NOTE — Evaluation (Addendum)
 Physical Therapy Limited Re-Evaluation Patient Details Name: Karen Warner MRN: [id number] DOB: [birth year] Today's Date: 12/30/2024  History of Present Illness  74 yo F presented to hospital with anterior chest pain. She states this pain is similar to her previous CAD episodes. Underwent heart cath 12/15/2024 and now advanced heart failure and LVAD coordinators consulted. S/p HM3 LVAD placement 1/23 and extubated s/p surgery on 1/24. Re-intubated 1/25-1/27 secondary to respiratory failure. PHMx:CAD, COPD (quit smoking 2021). Ischemic CM with EF < 20%, ICD placed. CHF, chronic low back pain, CKD3, DM2, DDD-cervical, fibromyalgia, MI, peripheral neuropathy, PVD, retinopathy and glaucoma  Clinical Impression  RN was hopeful for patient to get out of bed again this afternoon, however pt is having abdominal pain, and increased work of breathing on 5L O2 via [place]. Pt slumped down in bed making breathing difficult. PT/OT provided scoot up in the bed and attempt to place in chair position. Pt refused. Pt minimally participatory in LVAD education. Patient will benefit from intensive inpatient follow-up therapy, >3 hours/day. Goals have been reviewed and updated to reflect current level of mobility. PT will follow acutely to progress her mobility.          If plan is discharge home, recommend the following: A little help with bathing/dressing/bathroom;Assistance with cooking/housework;Assist for transportation;Help with stairs or ramp for entrance   Can travel by private vehicle    No    Equipment Recommendations None recommended by PT  Recommendations for Other Services  Rehab consult    Functional Status Assessment Patient has had a recent decline in their functional status and demonstrates the ability to make significant improvements in function in a reasonable and predictable amount of time.     Precautions / Restrictions Precautions Precautions: Fall;Sternal Recall of  Precautions/Restrictions: Intact Precaution/Restrictions Comments: LVAD,  foley Restrictions Weight Bearing Restrictions Per Provider Order: Yes Other Position/Activity Restrictions: sternal precautions      Mobility  Bed Mobility Overal bed mobility: Needs Assistance             General bed mobility comments: total A  for scooting up in bed            Pertinent Vitals/Pain Pain Assessment Pain Assessment: Faces Faces Pain Scale: Hurts even more Pain Location: abdomen Pain Descriptors / Indicators: Discomfort, Grimacing, Guarding Pain Intervention(s): Limited activity within patient's tolerance, Monitored during session, Repositioned    Home Living Family/patient expects to be discharged to:: Private residence Living Arrangements: Children;Other relatives Available Help at Discharge: Family;Available 24 hours/day;Friend(s) Type of Home: House Home Access: Stairs to enter Entrance Stairs-Rails: Left;Right;Can reach both Entrance Stairs-Number of Steps: 6   Home Layout: One level Home Equipment: Agricultural Consultant (2 wheels);Rollator (4 wheels);Shower seat      Prior Function Prior Level of Function : Independent/Modified Independent             Mobility Comments: indep without AD ADLs Comments: indep     Extremity/Trunk Assessment   Upper Extremity Assessment Upper Extremity Assessment: Defer to OT evaluation    Lower Extremity Assessment Lower Extremity Assessment: Generalized weakness    Cervical / Trunk Assessment Cervical / Trunk Assessment: Normal  Communication   Communication Communication: No apparent difficulties    Cognition Arousal: Lethargic Behavior During Therapy: WFL for tasks assessed/performed   PT - Cognitive impairments: No apparent impairments                         Following commands: Intact  Cueing Cueing Techniques: Verbal cues, Gestural cues     General Comments General comments (skin integrity,  edema, etc.): VS on 5L O2 via , increased work of breathing throughout session, PT/OT provided education on LVAD and power switch, with cuing pt is able to identify her drive line, forgets control panel and does not know about batteries.        Assessment/Plan    PT Assessment Patient needs continued PT services  PT Problem List Decreased strength;Decreased activity tolerance;Decreased mobility;Decreased coordination;Cardiopulmonary status limiting activity       PT Treatment Interventions DME instruction;Gait training;Stair training;Functional mobility training;Therapeutic activities;Therapeutic exercise;Balance training;Patient/family education;Manual techniques;Modalities    PT Goals (Current goals can be found in the Care Plan section)  Acute Rehab PT Goals Patient Stated Goal: to feel better PT Goal Formulation: With patient Time For Goal Achievement: 01/13/25 Potential to Achieve Goals: Good    Frequency Min 2X/week     Co-evaluation PT/OT/SLP Co-Evaluation/Treatment: Yes Reason for Co-Treatment: Complexity of the patient's impairments (multi-system involvement);For patient/therapist safety;To address functional/ADL transfers   OT goals addressed during session: ADL's and self-care       AM-PAC PT 6 Clicks Mobility  Outcome Measure Help needed turning from your back to your side while in a flat bed without using bedrails?: Total Help needed moving from lying on your back to sitting on the side of a flat bed without using bedrails?: Total Help needed moving to and from a bed to a chair (including a wheelchair)?: Total Help needed standing up from a chair using your arms (e.g., wheelchair or bedside chair)?: Total Help needed to walk in hospital room?: Total Help needed climbing 3-5 steps with a railing? : Total 6 Click Score: 6    End of Session   Activity Tolerance: Patient tolerated treatment well;Patient limited by fatigue;Patient limited by pain Patient  left: in bed;with call bell/phone within reach;with bed alarm set;with family/visitor present Nurse Communication: Mobility status PT Visit Diagnosis: Other abnormalities of gait and mobility (R26.89);Muscle weakness (generalized) (M62.81)    Time: 1410-1433 PT Time Calculation (min) (ACUTE ONLY): 23 min   Charges:   PT Evaluation $PT Re-evaluation: 1 Re-eval   PT General Charges $$ ACUTE PT VISIT: 1 Visit         Ruthell Feigenbaum B. Fleeta Lapidus PT, DPT Acute Rehabilitation Services Please use secure chat or  Call Office [phone]   [name] KATHEE Fleeta [facility] 12/30/2024, 3:03 PM

## 2024-12-30 NOTE — Progress Notes (Signed)
 Pharmacy Antibiotic Note  Karen Warner is a 74 y.o. female admitted on 12/14/2024 with sepsis.  Pharmacy has been consulted for vancomycin  dosing.   Patient is POD7 after LVAD implantation. Vancomycin  started due to persistent fevering. WBC up slightly at 19.0, patient now AF. Vancomycin  day 5. No growth on cultures to date. Vanc trough prior to 3rd maintenance dose 1/28 was 14 (goal 15-20 for sepsis). No AUC dosing due to LVAD. Vanc 1000 mg daily was continued for expected trend up with steady state.   Plan: Continue vancomycin  1000 mg q24h  Draw repeat trough if significant change in renal function  Goal 15-20 for sepsis  Monitor for de-escalation and EOT   Height: 5' 5 (165.1 cm) Weight: 78.8 kg (173 lb 11.6 oz) IBW/kg (Calculated) : 57  Temp (24hrs), Avg:98.6 F (37 C), Min:97.9 F (36.6 C), Max:99.1 F (37.3 C)  Recent Labs  Lab 12/25/24 0044 12/25/24 0414 12/25/24 1051 12/25/24 1344 12/25/24 2051 12/26/24 0454 12/27/24 0519 12/27/24 0734 12/28/24 0435 12/28/24 0949 12/28/24 1234 12/28/24 1547 12/29/24 0329 12/30/24 0348 12/30/24 0818  WBC  --    < >  --   --   --  21.0* 15.2*  --  17.4*  --   --   --  15.6* 19.0*  --   CREATININE  --    < >  --   --    < > 1.34*  --    < > 1.00  --   --  1.09*  1.05* 1.22* 1.25* 1.28*  LATICACIDVEN 0.9  --  3.9* 1.7  --   --   --   --   --  1.4 1.9  --   --   --   --   VANCOTROUGH  --   --   --   --   --   --   --   --   --   --   --  14*  --   --   --    < > = values in this interval not displayed.    Estimated Creatinine Clearance: 40 mL/min (A) (by C-G formula based on SCr of 1.28 mg/dL (H)).    Allergies[1]  Antimicrobials this admission: Perioperative abx ppx (vanc, rifampin , fluconazole , cefazolin ) Zosyn  1/25 >>1/27 Meropenem  1/28 >>  Vancomycin  1/25 >>   Microbiology results: 1/22 MRSA PCR: negative 1/30 Blood culture: NGTD   Thank you for allowing pharmacy to be a part of this patients  care.  Kamilo Och M Janalynn Eder 12/30/2024 9:05 AM        [1]  Allergies Allergen Reactions   Bee Venom Anaphylaxis   Sumatriptan Shortness Of Breath    Migraine worsened   Amoxicillin-Pot Clavulanate Diarrhea and Nausea And Vomiting    GI Intolerance   Oxycodone  Itching and Hives    Other reaction(s): Other (see comments) Funny feeling in head  off the edge    Buprenorphine Hcl     Other reaction(s): Itching   Clarithromycin Diarrhea    Abdominal pain   Duloxetine  Hcl Other (See Comments)    drowsiness   Hydrocodone Itching   Lactose Intolerance (Gi) Other (See Comments)    Upset stomach, gas/bloating   Liraglutide Other (See Comments)    Abdominal discomfort   Tramadol Hcl Itching

## 2024-12-30 NOTE — Progress Notes (Signed)
 LVAD Coordinator Rounding Note:  Admitted 12/14/24 due to CHF exacerbation. Underwent LVAD evaluation.   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  1/23: S/P HMIII Implant.Intra-op speed placed at 4600 due to low filling pressures.   1/24: Extubated. Had some low flows. Cleveprex started. 1/25: Re-intubated with desaturation, increased work of breathing 1/26: Ramp echo, speed increased to 5100 rpm.  1 unit PRBCs. 1/27: Extubated. Increased work of breathing, back on Bipap 1/28: Stopped Clevidipine   Pt alert this morning. She is lying in the bed. Denies complaints. No low flows since 1/27. Pt appears to have an increased work in breathing while lying in bed.  Nurses tell me that she has been doing this and was up to the chair this morning.  Pt's daughter Jon at bedside. She performed the dressing today with VAD coordinator assistance.  Vital signs: Temp: 97.5 HR: 119 Doppler Pressure: 84 Arterial BP: 82/71 (76) O2 Sat: 96% on 5 L/[place] Wt: 183.6>173.7>171.5>173.7 lbs    LVAD interrogation reveals:  Speed:5100 Flow: 3.7 Power: 3.4 w PI: 5.2  Alarms: none Events:  none  Hematocrit: 28 Fixed speed: 5100 Low speed limit: 4800   Drive Line: Existing VAD dressing removed and site care performed using sterile technique by daughter Jon with VAD coordinator assisting. Drive line exit site cleaned with Chlora prep applicators x 2, allowed to dry, and Silverlon patch with gauze dressing applied. Exit site unincorporated, the velour is fully implanted at exit site. Moderate amount of sanguinous drainage noted on previous dressing. No redness, tenderness, foul odor or rash noted. Drive line anchor re-applied. Continue daily dressing changes by VAD coordinator or nurse champion. Next dressing change due 12/31/24.        Labs:  LDH trend: 464>529>535>539>612>672>640  INR trend: 1.3>1.5>1.6>1.3>1.4>1.6>2  WBC trend:  18.5>23.6>21.0>15.2>17.4>15.6>19  Hgb trend: 9.2>8.5>7.6>8.9>9.5>8.7>8.1  Anticoagulation Plan: - INR Goal: 2.0 - 2.5 - ASA Dose: none - Heparin  gtt per pharmacy - Coumadin  dosing per pharmacy  Blood Products:  - Intra-op 1/23:   2 PRBC  4 FFP  225 cc cell saver - Post op:  1/26: 1 PRBC  Device: - [employer] -Therapies: OFF  Arrythmias:   Respiratory: Extubated 1/24. Reintubated 1/25 due to respiratory distress. Extubated 1/27.   Infection:   Renal:  -BUN/CRT: 10/1.3>11/1.15>23/1.34>19/1.03>15/1>22/1.22>34/1.28  Adverse Events on VAD:  Drips:  Epinephrine  0.5 mcg/min Milrinone  0.375 mcg/kg/min Heparin  500 units/hr-off Amiodarone  30mg /hr Lasix  15 mg/hr  Patient Education:  Pt's daughter Jon performed dressing change today at bedside with VAD coordinator assisting  Plan/Recommendations:  1. Page VAD coordinator with any drive line or equipment concerns 2. Daily drive line dressing changes per VAD coordinator or nurse champion  Lauraine Ip RN, BSN VAD Coordinator 24/7 Pager [phone]

## 2024-12-30 NOTE — Progress Notes (Addendum)
 Patient ID: Karen Warner, female   DOB: [birth year], 74 y.o.   MRN: [id number]     Advanced Heart Failure Rounding Note  Cardiologist: Dub Huntsman, DO  [facility] Cardiologist: Dr Rolan    Patient Profile   Karen Warner is a 74 y.o. female with history of CAD and ischemic cardiomyopathy, EF <20%, CKD III and COPD admitted w/ a/c CHF, chest/abdominal pain. RHC c/w low output.   Significant events:   1/15: RHC (RA 2, PA 21/10, PCW 3,  PA sat 59%, TD CI 1.49, FICK CI 1.88, PAPi 5.5), started on milrinone            LHC patent stents, nonobstructive CAD   1/19: co-ox persistently in 40s, milrinone  increased 0.375 12/23/24: S/P HMIII Implant.Intra-op speed placed at 4700 due to low filling pressures.  Received 2UPRBC, 3 FFP, cellsaver, 1 albumin , and 4 FFP  12/23/24 POCUS: VAD well positioned. RV looked ok but PAPI low with high CVP 12/24/24: Extubated. Had some low flows. Cleveprex started 1/25: Re-intubated with desaturation, increased work of breathing 1/26: Ramp echo, speed increased to 5100 rpm.  1 unit PRBCs. 1/27: Extubated. Increased work of breathing, back on Bipap 1/28: Stopped Clevidipine   Subjective:    POD #7  Karen Warner is out, has PICC line.  Co-ox 68% on 0.375 milrinone  + 0.5 epi.  CVP 10. Negative 1.7L last 24 hrs with lasix  gtt at 15 mg/hr + 5 mg metolazone . Weight remains up 10 lb from pre-op.  Appears tachypneic. States she is hungry but otherwise difficult to engage. Refused to ambulate this am. Told RN she wanted to stay in bed.  HeartMate 2 VAD Equipment Check Pump Speed (RPM): 4600 RPM HeartMate 3 VAD Equipment Check Pump Speed (RPM): 5100 RPM Pump Flow (LPM): 4.5 Power (Watts): 3 Watts Pulsatility Index: 3 Fixed Speed Limit: 5100 rpm Low Speed Limit: 4800 rpm Alarms: No alarms Auscultated: Normal expected humming Power Module Self-Test (Daily): Done System Controller Self-Test: Passed Patient Battery Source: [facility] / Wall unit Emergency  Equipment at Bedside: Yes No low flow alarms since 1/27   Objective:   Weight Range: 78.8 kg Body mass index is 28.91 kg/m.   Vital Signs:   Temp:  [97.9 F (36.6 C)-99.1 F (37.3 C)] 98.1 F (36.7 C) (01/30 0834) Pulse Rate:  [112-148] 129 (01/30 0834) Resp:  [16-26] 23 (01/30 0834) SpO2:  [94 %-100 %] 98 % (01/30 0834) Arterial Line BP: (69-146)/(54-83) 77/62 (01/30 0700) Weight:  [78.8 kg] 78.8 kg (01/30 0600) Last BM Date : 12/29/24  Weight change: Filed Weights   12/28/24 0500 12/29/24 0530 12/30/24 0600  Weight: 77.8 kg 78.6 kg 78.8 kg    Intake/Output:   Intake/Output Summary (Last 24 hours) at 12/30/2024 9075 Last data filed at 12/30/2024 0700 Gross per 24 hour  Intake 2585.05 ml  Output 4370 ml  Net -1784.95 ml     Physical Exam   Physical Exam: GENERAL: Fatigued appearing NECK: JVP 10-12 CARDIAC:  Mechanical heart sounds with LVAD hum Respiratory: Tachypneic  LVAD exit site:   Dressing dry and intact. Stabilization device present and accurately applied.   EXTREMITIES:  Trace edema NEUROLOGIC:  Alert and oriented x 4.  Affect flat.     Telemetry   ST 110s/120s, BiV paced  Labs   CBC Recent Labs    12/29/24 0329 12/29/24 0546 12/30/24 0348  WBC 15.6*  --  19.0*  NEUTROABS 11.9*  --  14.1*  HGB 8.7* 9.2* 9.1*  HCT 26.5*  27.0* 28.0*  MCV 91.1  --  92.1  PLT 308  --  378   Basic Metabolic Panel Recent Labs    98/70/73 0329 12/29/24 0546 12/30/24 0348 12/30/24 0818  NA 138   < > 142 142  K 4.3   < > 2.6* 3.5  CL 104  --  100 102  CO2 23  --  28 28  GLUCOSE 254*  --  150* 144*  BUN 22  --  32* 34*  CREATININE 1.22*  --  1.25* 1.28*  CALCIUM  8.5*  --  8.8* 9.1  MG 2.3  --  2.1  --   PHOS 2.2*  --  3.2  --    < > = values in this interval not displayed.   Liver Function Tests Recent Labs    12/28/24 0810 12/28/24 1547 12/30/24 0348  AST 39  --   --   ALT 6  --   --   ALKPHOS 98  --   --   BILITOT 0.5  --   --   PROT 6.6   --   --   ALBUMIN  3.6 3.5 3.4*   No results for input(s): LIPASE, AMYLASE in the last 72 hours. Cardiac Enzymes No results for input(s): CKTOTAL, CKMB, CKMBINDEX, TROPONINI in the last 72 hours.  BNP: BNP (last 3 results) Recent Labs    09/01/24 1457  BNP 169.4*    ProBNP (last 3 results) Recent Labs    12/14/24 0826 12/24/24 0406 12/30/24 0348  PROBNP 1,539.0* 3,345.0* 14,579.0*     D-Dimer No results for input(s): DDIMER in the last 72 hours.  Hemoglobin A1C No results for input(s): HGBA1C in the last 72 hours. Fasting Lipid Panel No results for input(s): CHOL, HDL, LDLCALC, TRIG, CHOLHDL, LDLDIRECT in the last 72 hours. Medications:   Scheduled Medications:  arformoterol   15 mcg Nebulization BID   Chlorhexidine  Gluconate Cloth  6 each Topical Daily   docusate  100 mg Per Tube Daily   feeding supplement (PIVOT 1.5 CAL)  1,000 mL Per Tube Q24H   gabapentin   200 mg Per Tube QHS   hydrALAZINE   50 mg Per Tube Q8H   insulin  aspart  0-20 Units Subcutaneous Q4H   insulin  aspart  8 Units Subcutaneous Q4H   insulin  glargine-yfgn  20 Units Subcutaneous BID   lidocaine   2 patch Transdermal Q24H   mouth rinse  15 mL Mouth Rinse 4 times per day   pantoprazole  (PROTONIX ) IV  40 mg Intravenous Q24H   polyethylene glycol  17 g Per Tube Daily   potassium chloride   40 mEq Per Tube Q4H   revefenacin   175 mcg Nebulization Daily   sildenafil   20 mg Per Tube TID   sodium chloride  flush  10-40 mL Intracatheter Q12H   sodium chloride  flush  3 mL Intravenous Q12H   Vitamin D  (Ergocalciferol )  50,000 Units Oral Q Sun   warfarin  2.5 mg Oral ONCE-1600   Warfarin - Pharmacist Dosing Inpatient   Does not apply q1600    Infusions:  amiodarone  30 mg/hr (12/30/24 0700)   epinephrine  0.5 mcg/min (12/30/24 0824)   furosemide  (LASIX ) 200 mg in dextrose  5 % 100 mL (2 mg/mL) infusion 15 mg/hr (12/30/24 0700)   insulin  5 Units/hr (12/30/24 0700)   meropenem   (MERREM ) IV 1 g (12/30/24 0921)   milrinone  0.375 mcg/kg/min (12/30/24 0922)   vancomycin  Stopped (12/29/24 1617)    PRN Medications: dextrose , fentaNYL  (SUBLIMAZE ) injection, hydrALAZINE , LORazepam , ondansetron  (ZOFRAN ) IV,  mouth rinse, oxyCODONE , sodium chloride  flush, sodium chloride  flush     Assessment/Plan   1. 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  - Intra Op Speed 4600 due to low filling pressures.  - Ramp echo 1/26: RV small, moderate dysfunction; interventricular septum bows to the right; the aortic valve does not open. Speed increased from 4800 step-wise to 5100.  Flow increased 3.0 L/min => 3.7 L/min, no ectopy.  LVIDD decreased appropriately.  At 5100 rpm, the IV septum still had some rightward shift but less than initially and the RV appeared to fill more.  - Currently on milrinone  0.375 + epinephrine  0.5. Decrease milrinone  to 0.25. Very sensitive to med changes, will continue epi today while diuresing. - Improved off Clevidipine  => some concern this was causing pulmonary A-V shunting.   - Continue aggressive diuresis. CVP 10. Continue lasix  gtt at 15 mg/hr and given 5 mg metolazone  BID - Continue hydralazine  50 TID - Continue sildenafil  20 TID - MAP okay in 70s. Remove Aline. - INR 2.0. Stop heparin  gtt. Warfarin per HF pharmD.  2.  Acute on chronic systolic CHF: Ischemic cardiomyopathy.  [employer] CRT-D device  - She is end-stage NYHA IV.  - s/p HM-3 VAD - management as above  3. Acute hypoxic respiratory failure - reintubated 1/25, extubated 1/27, currently on 4L O2 [place] - More tachypenic today. No fever. Persistent leukocytosis, however; was on steroids until 1/29. Has been on vanc/meropenem .  - Repeat CXR today - Not sure that all of her O2 needs and tachypnea are d/t fluid  4. CAD: PCI to mid/distal LAD in 2020 and proximal LAD in 2021.   Cath this admit showed stable coronary anatomy. Suspect CP related  to HF as above - Off ranolazine , imdur , plavix  - Eventually add back crestor  and zetia   5. COPD: History of smoking, quit 2021.    6. CKD 3: Follows with nephology at [facility].  - Creatinine stable.   7. Obesity: Has been on semaglutide. Now on hold.   8. PAD: Moderately decreased ABI on right in 7/25. Medical mgmt per Dr. Serene. Again ABI on 1/19, the R had moderate disease, and mild on the L. No claudication or other PAD symptoms.   9. Anemia: Post-op, 1 unit PRBCs 1/25.  Hgb 9.1  - Transfuse Hgb < 8.   10. FEN: TFs ongoing.  - Encourage PO intake  Needs to mobilize. Refused ambulation this am.    CRITICAL CARE Performed by: COLLETTA SHAVER N   Total critical care time: 10 minutes  Critical care time was exclusive of separately billable procedures and treating other patients.  Critical care was necessary to treat or prevent imminent or life-threatening deterioration.  Critical care was time spent personally by me on the following activities: development of treatment plan with patient and/or surrogate as well as nursing, discussions with consultants, evaluation of patient's response to treatment, examination of patient, obtaining history from patient or surrogate, ordering and performing treatments and interventions, ordering and review of laboratory studies, ordering and review of radiographic studies, pulse oximetry and re-evaluation of patient's condition.   Length of Stay: 15  FINCH, LINDSAY N, PA-C  12/30/2024, 9:24 AM  Advanced Heart Failure Team Pager [phone] (M-F; 7a - 5p)   Please visit [email or web address]: For overnight coverage please call cardiology fellow first. If fellow not available call Shock/ECMO MD on call.  For ECMO / Mechanical Support (Impella, IABP, LVAD) issues call Shock /  ECMO MD on call.   Patient seen with PA, I formulated the plan and agree with the above note.   Co-ox 68% this morning with CVP around 8.  I/Os net negative 1721 cc on Lasix  gtt 15  mg/hr + 1 dose metolazone  yesterday.  She is on epinephrine  0.5, milrinone  0.375.     Afebrile but WBCs higher at 19 on meropenem /vancomycin . Off steroids.  Still on 5L HFNC.    Good flow on LVAD 4.5 L/min, no alarms.    General: Mildly tachypneic.  HEENT: Normal. Neck: Supple, JVP 8-9 cm. Carotids OK.  Cardiac:  Mechanical heart sounds with LVAD hum present.  Lungs:  CTAB, normal effort.  Abdomen:  NT, ND, no HSM. No bruits or masses. +BS  LVAD exit site: Well-healed and incorporated. Dressing dry and intact. No erythema or drainage. Stabilization device present and accurately applied. Driveline dressing changed daily per sterile technique. Extremities:  Warm and dry. No cyanosis, clubbing, rash, or edema.  Neuro:  Alert & oriented x 3. Cranial nerves grossly intact. Moves all 4 extremities w/o difficulty. Affect pleasant     Possible intrapulmonary shunting triggered by clevidipine , now off. She remains on 5L HFNC.  CVP down to 8 though weight still above baseline. She still looks tachypneic. WBCs higher at 19 but afebrile now.  - Continue diuresis today, Lasix  15 mg/hr + metolazone  x 1 dose.  Replace K.  - She remains on vancomycin /meropenem  for possible PNA.  - Incentive spirometry, need to mobilize.    MAP stable. Co-ox 68%.  Very sensitive to elevated MAP (low flows).  No low flows on LVAD since 1/27.  Remains on low dose epinephrine  0.5 for RV, continue for now until diuresed.  Off NTG gtt.  Now on po hydralazine  50 tid.  Continue sildenafil  20 tid. Will remove arterial line today.  Will decrease milrinone  to 0.25 today.   Stop heparin  gtt with INR 2.   Encouraged po nutrition and walking today.  She is on tube feeds.   CRITICAL CARE Performed by: Ezra Shuck  Total critical care time: 45 minutes  Critical care time was exclusive of separately billable procedures and treating other patients.  Critical care was necessary to treat or prevent imminent or life-threatening  deterioration.  Critical care was time spent personally by me on the following activities: development of treatment plan with patient and/or surrogate as well as nursing, discussions with consultants, evaluation of patient's response to treatment, examination of patient, obtaining history from patient or surrogate, ordering and performing treatments and interventions, ordering and review of laboratory studies, ordering and review of radiographic studies, pulse oximetry and re-evaluation of patient's condition.  Ezra Shuck 12/30/2024 10:22 AM

## 2024-12-30 NOTE — Progress Notes (Addendum)
 CBG improved on insulin  drip. Ordered glargine 20 units BID, tube feed coverage 8 units q4h (with hold parameters) and SSI with plan to transition off insulin  drip. Is on tresiba 26 units BID at home.  Rexene LOISE Blush, PA-C 12/30/24 4:56 AM [facility]  For contact information, see Amion.  After hours, 7PM- 7AM, please call on call APP for 2H.

## 2024-12-30 NOTE — Plan of Care (Signed)
 Adamantly refused to attempt ambulation, get OOB to chair, do bed mobility exercises, or to have the bed placed in cardiac chair position after multiple attempts by RN and several providers.   Problem: Activity: Goal: Ability to return to baseline activity level will improve Outcome: Not Progressing   Problem: Activity: Goal: Risk for activity intolerance will decrease Outcome: Not Progressing

## 2024-12-31 ENCOUNTER — Inpatient Hospital Stay (HOSPITAL_COMMUNITY)

## 2024-12-31 DIAGNOSIS — J9601 Acute respiratory failure with hypoxia: Secondary | ICD-10-CM | POA: Diagnosis not present

## 2024-12-31 DIAGNOSIS — D62 Acute posthemorrhagic anemia: Secondary | ICD-10-CM | POA: Diagnosis not present

## 2024-12-31 DIAGNOSIS — Z95811 Presence of heart assist device: Secondary | ICD-10-CM

## 2024-12-31 DIAGNOSIS — E1159 Type 2 diabetes mellitus with other circulatory complications: Secondary | ICD-10-CM | POA: Diagnosis not present

## 2024-12-31 DIAGNOSIS — R109 Unspecified abdominal pain: Secondary | ICD-10-CM | POA: Diagnosis not present

## 2024-12-31 DIAGNOSIS — I255 Ischemic cardiomyopathy: Secondary | ICD-10-CM | POA: Diagnosis not present

## 2024-12-31 DIAGNOSIS — D72829 Elevated white blood cell count, unspecified: Secondary | ICD-10-CM | POA: Diagnosis not present

## 2024-12-31 DIAGNOSIS — I5023 Acute on chronic systolic (congestive) heart failure: Secondary | ICD-10-CM | POA: Diagnosis not present

## 2024-12-31 DIAGNOSIS — A419 Sepsis, unspecified organism: Secondary | ICD-10-CM | POA: Diagnosis not present

## 2024-12-31 DIAGNOSIS — N1832 Chronic kidney disease, stage 3b: Secondary | ICD-10-CM | POA: Diagnosis not present

## 2024-12-31 DIAGNOSIS — R000 Tachycardia, unspecified: Secondary | ICD-10-CM | POA: Diagnosis not present

## 2024-12-31 DIAGNOSIS — Z794 Long term (current) use of insulin: Secondary | ICD-10-CM | POA: Diagnosis not present

## 2024-12-31 LAB — COOXEMETRY PANEL
Carboxyhemoglobin: 2 % — ABNORMAL HIGH (ref 0.5–1.5)
Carboxyhemoglobin: 1.2 % (ref 0.5–1.5)
Methemoglobin: <0.7 % (ref 0.0–1.5)
Methemoglobin: <0.7 % (ref 0.0–1.5)
O2 Saturation: 73.6 %
O2 Saturation: 60 %
Total hemoglobin: 9.7 g/dL — ABNORMAL LOW (ref 12.0–16.0)
Total hemoglobin: 8.8 g/dL — ABNORMAL LOW (ref 12.0–16.0)

## 2024-12-31 LAB — BASIC METABOLIC PANEL WITH GFR
Anion gap: 11 (ref 5–15)
BUN: 47 mg/dL — ABNORMAL HIGH (ref 8–23)
CO2: 31 mmol/L (ref 22–32)
Calcium: 9.6 mg/dL (ref 8.9–10.3)
Chloride: 93 mmol/L — ABNORMAL LOW (ref 98–111)
Creatinine, Ser: 1.32 mg/dL — ABNORMAL HIGH (ref 0.44–1.00)
GFR, Estimated: 42 mL/min — ABNORMAL LOW
Glucose, Bld: 206 mg/dL — ABNORMAL HIGH (ref 70–99)
Potassium: 4.8 mmol/L (ref 3.5–5.1)
Sodium: 135 mmol/L (ref 135–145)

## 2024-12-31 LAB — GLUCOSE, CAPILLARY
Glucose-Capillary: 115 mg/dL — ABNORMAL HIGH (ref 70–99)
Glucose-Capillary: 116 mg/dL — ABNORMAL HIGH (ref 70–99)
Glucose-Capillary: 125 mg/dL — ABNORMAL HIGH (ref 70–99)
Glucose-Capillary: 126 mg/dL — ABNORMAL HIGH (ref 70–99)
Glucose-Capillary: 143 mg/dL — ABNORMAL HIGH (ref 70–99)
Glucose-Capillary: 153 mg/dL — ABNORMAL HIGH (ref 70–99)
Glucose-Capillary: 155 mg/dL — ABNORMAL HIGH (ref 70–99)
Glucose-Capillary: 156 mg/dL — ABNORMAL HIGH (ref 70–99)
Glucose-Capillary: 160 mg/dL — ABNORMAL HIGH (ref 70–99)
Glucose-Capillary: 160 mg/dL — ABNORMAL HIGH (ref 70–99)
Glucose-Capillary: 162 mg/dL — ABNORMAL HIGH (ref 70–99)
Glucose-Capillary: 166 mg/dL — ABNORMAL HIGH (ref 70–99)
Glucose-Capillary: 174 mg/dL — ABNORMAL HIGH (ref 70–99)
Glucose-Capillary: 174 mg/dL — ABNORMAL HIGH (ref 70–99)
Glucose-Capillary: 180 mg/dL — ABNORMAL HIGH (ref 70–99)
Glucose-Capillary: 184 mg/dL — ABNORMAL HIGH (ref 70–99)
Glucose-Capillary: 188 mg/dL — ABNORMAL HIGH (ref 70–99)
Glucose-Capillary: 191 mg/dL — ABNORMAL HIGH (ref 70–99)
Glucose-Capillary: 195 mg/dL — ABNORMAL HIGH (ref 70–99)
Glucose-Capillary: 215 mg/dL — ABNORMAL HIGH (ref 70–99)
Glucose-Capillary: 463 mg/dL — ABNORMAL HIGH (ref 70–99)

## 2024-12-31 LAB — CBC
HCT: 28.5 % — ABNORMAL LOW (ref 36.0–46.0)
Hemoglobin: 9.2 g/dL — ABNORMAL LOW (ref 12.0–15.0)
MCH: 29.4 pg (ref 26.0–34.0)
MCHC: 32.3 g/dL (ref 30.0–36.0)
MCV: 91.1 fL (ref 80.0–100.0)
Platelets: 439 10*3/uL — ABNORMAL HIGH (ref 150–400)
RBC: 3.13 MIL/uL — ABNORMAL LOW (ref 3.87–5.11)
RDW: 16.8 % — ABNORMAL HIGH (ref 11.5–15.5)
WBC: 17.6 10*3/uL — ABNORMAL HIGH (ref 4.0–10.5)
nRBC: 1.1 % — ABNORMAL HIGH (ref 0.0–0.2)

## 2024-12-31 LAB — RENAL FUNCTION PANEL
Albumin: 3.3 g/dL — ABNORMAL LOW (ref 3.5–5.0)
Anion gap: 14 (ref 5–15)
BUN: 42 mg/dL — ABNORMAL HIGH (ref 8–23)
CO2: 32 mmol/L (ref 22–32)
Calcium: 9.3 mg/dL (ref 8.9–10.3)
Chloride: 89 mmol/L — ABNORMAL LOW (ref 98–111)
Creatinine, Ser: 1.25 mg/dL — ABNORMAL HIGH (ref 0.44–1.00)
GFR, Estimated: 45 mL/min — ABNORMAL LOW
Glucose, Bld: 424 mg/dL — ABNORMAL HIGH (ref 70–99)
Phosphorus: 2.6 mg/dL (ref 2.5–4.6)
Potassium: 3.1 mmol/L — ABNORMAL LOW (ref 3.5–5.1)
Sodium: 135 mmol/L (ref 135–145)

## 2024-12-31 LAB — ECHOCARDIOGRAM LIMITED
Est EF: <20
Height: 65 in
Weight: 2779.56 [oz_av]

## 2024-12-31 LAB — PROTIME-INR
INR: 2.1 — ABNORMAL HIGH (ref 0.8–1.2)
Prothrombin Time: 24.8 s — ABNORMAL HIGH (ref 11.4–15.2)

## 2024-12-31 LAB — LACTATE DEHYDROGENASE: LDH: 641 U/L — ABNORMAL HIGH (ref 105–235)

## 2024-12-31 MED ORDER — ACETAMINOPHEN 325 MG PO TABS: 650.0000 mg | ORAL_TABLET | Freq: Four times a day (QID) | ORAL | Status: DC

## 2024-12-31 MED ORDER — ACETAMINOPHEN 325 MG PO TABS: 650.0000 mg | ORAL_TABLET | Freq: Four times a day (QID) | ORAL | Status: DC | PRN

## 2024-12-31 MED ORDER — POTASSIUM CHLORIDE 20 MEQ PO PACK
40.0000 meq | PACK | ORAL | Status: AC
  Administered 2024-12-31 (×2): 40 meq via ORAL
  Filled 2024-12-31 (×2): qty 2

## 2024-12-31 MED ORDER — GERHARDT'S BUTT CREAM
1.0000 | TOPICAL_CREAM | CUTANEOUS | Status: AC | PRN
  Filled 2024-12-31: qty 60

## 2024-12-31 MED ORDER — POTASSIUM CHLORIDE 10 MEQ/50ML IV SOLN
10.0000 meq | INTRAVENOUS | Status: AC
  Administered 2024-12-31 (×2): 10 meq via INTRAVENOUS
  Filled 2024-12-31 (×2): qty 50

## 2024-12-31 MED ORDER — ACETAMINOPHEN 160 MG/5ML PO SOLN
650.0000 mg | Freq: Four times a day (QID) | ORAL | Status: DC
  Administered 2024-12-31 – 2025-01-02 (×9): 650 mg
  Filled 2024-12-31 (×9): qty 20.3

## 2024-12-31 MED ORDER — WARFARIN SODIUM 2.5 MG PO TABS
2.5000 mg | ORAL_TABLET | Freq: Once | ORAL | Status: AC
  Administered 2024-12-31: 2.5 mg via ORAL
  Filled 2024-12-31: qty 1

## 2024-12-31 NOTE — Progress Notes (Signed)
 Patient ID: Karen Warner, female   DOB: [birth year], 74 y.o.   MRN: [id number]     Advanced Heart Failure Rounding Note  Cardiologist: Dub Huntsman, DO  [facility] Cardiologist: Dr Rolan    Patient Profile   Karen Warner is a 74 y.o. female with history of CAD and ischemic cardiomyopathy, EF <20%, CKD III and COPD admitted w/ a/c CHF, chest/abdominal pain. RHC c/w low output.   Significant events:   1/15: RHC (RA 2, PA 21/10, PCW 3,  PA sat 59%, TD CI 1.49, FICK CI 1.88, PAPi 5.5), started on milrinone            LHC patent stents, nonobstructive CAD   1/19: co-ox persistently in 40s, milrinone  increased 0.375 12/23/24: S/P HMIII Implant.Intra-op speed placed at 4700 due to low filling pressures.  Received 2UPRBC, 3 FFP, cellsaver, 1 albumin , and 4 FFP  12/23/24 POCUS: VAD well positioned. RV looked ok but PAPI low with high CVP 12/24/24: Extubated. Had some low flows. Cleveprex started 1/25: Re-intubated with desaturation, increased work of breathing 1/26: Ramp echo, speed increased to 5100 rpm.  1 unit PRBCs. 1/27: Extubated. Increased work of breathing, back on Bipap 1/28: Stopped Clevidipine   Subjective:    POD #8  Co-ox 74%% on 0.25 milrinone  + 0.5 epinephrine .  MAP is difficult to measure, most recently 70.   Breathing better today, not tachypneic.  CVP 7.  Net negative 3464 cc last 24 hrs with lasix  gtt at 15 mg/hr + 5 mg bid metolazone . No weight yet. Creatinine 1.25.   More alert this morning.   She remains on vancomycin /meropenem  for PNA coverage.   She ate about half of breakfast,  getting TFs as well.   HeartMate 2 VAD Equipment Check Pump Speed (RPM): 4600 RPM HeartMate 3 VAD Equipment Check Pump Speed (RPM): 5150 RPM Pump Flow (LPM): 4.0 Power (Watts): 3 Watts Pulsatility Index: 4.7 Fixed Speed Limit: 5100 rpm Low Speed Limit: 4800 rpm Alarms: No alarms Auscultated: Normal expected humming Power Module Self-Test (Daily): Done System  Controller Self-Test: Passed Patient Battery Source: [facility] / Wall unit Emergency Equipment at Bedside: Yes No low flow alarms since 1/27 LDH 640 => 641 INR 2.1   Objective:   Weight Range: 78.8 kg Body mass index is 28.91 kg/m.   Vital Signs:   Temp:  [97.3 F (36.3 C)-99.1 F (37.3 C)] 99 F (37.2 C) (01/31 0800) Pulse Rate:  [113-127] 127 (01/31 0700) Resp:  [15-35] 35 (01/31 0900) BP: (82-127)/(50-110) 83/50 (01/31 0800) SpO2:  [93 %-99 %] 96 % (01/31 0700) Arterial Line BP: (74-94)/(60-77) 94/74 (01/30 1400) FiO2 (%):  [40 %-50 %] 50 % (01/31 0334) Last BM Date : 12/30/24  Weight change: Filed Weights   12/28/24 0500 12/29/24 0530 12/30/24 0600  Weight: 77.8 kg 78.6 kg 78.8 kg    Intake/Output:   Intake/Output Summary (Last 24 hours) at 12/31/2024 0952 Last data filed at 12/31/2024 0950 Gross per 24 hour  Intake 2504.87 ml  Output 5640 ml  Net -3135.13 ml     Physical Exam   General: Well appearing this am. NAD.  HEENT: Normal. Neck: Supple, JVP 7-8 cm. Carotids OK.  Cardiac:  Mechanical heart sounds with LVAD hum present.  Lungs:  CTAB, normal effort.  Abdomen:  NT, ND, no HSM. No bruits or masses. +BS  LVAD exit site: Well-healed and incorporated. Dressing dry and intact. No erythema or drainage. Stabilization device present and accurately applied. Driveline dressing changed daily per sterile  technique. Extremities:  Warm and dry. No cyanosis, clubbing, rash, or edema.  Neuro:  Alert & oriented x 3. Cranial nerves grossly intact. Moves all 4 extremities w/o difficulty. Affect pleasant     Telemetry   ST 110s/120s, BiV paced (personally reviewed)  Labs   CBC Recent Labs    12/29/24 0329 12/29/24 0546 12/30/24 0348 12/31/24 0512  WBC 15.6*  --  19.0* 17.6*  NEUTROABS 11.9*  --  14.1*  --   HGB 8.7*   < > 9.1* 9.2*  HCT 26.5*   < > 28.0* 28.5*  MCV 91.1  --  92.1 91.1  PLT 308  --  378 439*   < > = values in this interval not displayed.    Basic Metabolic Panel Recent Labs    98/70/73 0329 12/29/24 0546 12/30/24 0348 12/30/24 0818 12/30/24 1400 12/31/24 0512  NA 138   < > 142   < > 136 135  K 4.3   < > 2.6*   < > 5.1 3.1*  CL 104  --  100   < > 98 89*  CO2 23  --  28   < > 26 32  GLUCOSE 254*  --  150*   < > 471* 424*  BUN 22  --  32*   < > 36* 42*  CREATININE 1.22*  --  1.25*   < > 1.30* 1.25*  CALCIUM  8.5*  --  8.8*   < > 9.2 9.3  MG 2.3  --  2.1  --   --   --   PHOS 2.2*  --  3.2  --   --  2.6   < > = values in this interval not displayed.   Liver Function Tests Recent Labs    12/30/24 0348 12/31/24 0512  ALBUMIN  3.4* 3.3*   No results for input(s): LIPASE, AMYLASE in the last 72 hours. Cardiac Enzymes No results for input(s): CKTOTAL, CKMB, CKMBINDEX, TROPONINI in the last 72 hours.  BNP: BNP (last 3 results) Recent Labs    09/01/24 1457  BNP 169.4*    ProBNP (last 3 results) Recent Labs    12/14/24 0826 12/24/24 0406 12/30/24 0348  PROBNP 1,539.0* 3,345.0* 14,579.0*     D-Dimer No results for input(s): DDIMER in the last 72 hours.  Hemoglobin A1C No results for input(s): HGBA1C in the last 72 hours. Fasting Lipid Panel No results for input(s): CHOL, HDL, LDLCALC, TRIG, CHOLHDL, LDLDIRECT in the last 72 hours. Medications:   Scheduled Medications:  arformoterol   15 mcg Nebulization BID   Chlorhexidine  Gluconate Cloth  6 each Topical Daily   docusate  100 mg Per Tube Daily   feeding supplement (PIVOT 1.5 CAL)  1,000 mL Per Tube Q24H   gabapentin   200 mg Per Tube QHS   hydrALAZINE   50 mg Per Tube Q8H   lidocaine   2 patch Transdermal Q24H   mouth rinse  15 mL Mouth Rinse 4 times per day   pantoprazole  (PROTONIX ) IV  40 mg Intravenous Q24H   polyethylene glycol  17 g Per Tube Daily   potassium chloride   40 mEq Oral Q3H   revefenacin   175 mcg Nebulization Daily   sildenafil   20 mg Per Tube TID   sodium chloride  flush  10-40 mL Intracatheter Q12H    sodium chloride  flush  3 mL Intravenous Q12H   Vitamin D  (Ergocalciferol )  50,000 Units Oral Q Sun   Warfarin - Pharmacist Dosing Inpatient   Does not apply  q1600    Infusions:  amiodarone  30 mg/hr (12/31/24 0800)   epinephrine  0.5 mcg/min (12/31/24 0800)   furosemide  (LASIX ) 200 mg in dextrose  5 % 100 mL (2 mg/mL) infusion 20 mg/hr (12/31/24 0800)   insulin  6.5 Units/hr (12/31/24 0800)   meropenem  (MERREM ) IV Stopped (12/30/24 2310)   milrinone  0.25 mcg/kg/min (12/31/24 0800)   potassium chloride  10 mEq (12/31/24 0946)   vancomycin  Stopped (12/30/24 1646)    PRN Medications: dextrose , fentaNYL  (SUBLIMAZE ) injection, hydrALAZINE , LORazepam , ondansetron  (ZOFRAN ) IV, mouth rinse, oxyCODONE , sodium chloride  flush, sodium chloride  flush     Assessment/Plan   1. 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  - Intra Op Speed 4600 due to low filling pressures.  - Ramp echo 1/26: RV small, moderate dysfunction; interventricular septum bows to the right; the aortic valve does not open. Speed increased from 4800 step-wise to 5100.  Flow increased 3.0 L/min => 3.7 L/min, no ectopy.  LVIDD decreased appropriately.  At 5100 rpm, the IV septum still had some rightward shift but less than initially and the RV appeared to fill more.  - Currently on milrinone  0.25 + epinephrine  0.5. Co-ox 74% today.  Decrease milrinone  to 0.125. Repeat co-ox in pm.  - Oxygen saturation improved off Clevidipine  => some concern this was causing pulmonary A-V shunting.   - MAP difficult to obtain, appears to be around 70.  Will stop hydralazine  for now.  - Good diuresis over the last day, CVP 7 today. Decrease Lasix  gtt to 10 mg/hr and will not give metolazone  today.  Replace K, BMET in pm.  - Continue sildenafil  20 TID for RV support.  - Continue warfarin, INR 2.1.  - LVAD parameters stable with no low flow events.  Limited echo today, RV normal size with moderate dysfunction,  IV septum mildly towards the right.  Aortic valve does not open.  I increased speed incrementally to 5200 rpm.   2.  Acute on chronic systolic CHF: Ischemic cardiomyopathy.  [employer] CRT-D device  - She is end-stage NYHA IV.  - s/p HM-3 VAD - management as above  3. Acute hypoxic respiratory failure - reintubated 1/25, extubated 1/27.  - On vancomycin /meropenem  to cover for possible PNA.  - She did not have enough fluid for thoracentesis.  - CXR 1/30 with worsening pulmonary edema.  Breathing better today, excellent diuresis.  Will repeat pCXR.    4. CAD: PCI to mid/distal LAD in 2020 and proximal LAD in 2021.   Cath this admit showed stable coronary anatomy. Suspect CP related to HF as above - Off ranolazine , imdur , plavix  - Eventually add back crestor  and zetia   5. COPD: History of smoking, quit 2021.    6. CKD 3: Follows with nephology at [facility].  - Creatinine stable.   7. Obesity: Has been on semaglutide. Now on hold.   8. PAD: Moderately decreased ABI on right in 7/25. Medical mgmt per Dr. Serene. Again ABI on 1/19, the R had moderate disease, and mild on the L. No claudication or other PAD symptoms.   9. Anemia: Post-op, 1 unit PRBCs 1/25.  Hgb 9.2  - Transfuse Hgb < 8.   10. FEN: TFs ongoing.  - Encourage PO intake  Encouraged her to get out of bed, walk in room, sit in chair. Needs to use IS.    CRITICAL CARE Performed by: Ezra Shuck  Total critical care time: 45 minutes  Critical care time was exclusive of  separately billable procedures and treating other patients.  Critical care was necessary to treat or prevent imminent or life-threatening deterioration.  Critical care was time spent personally by me on the following activities: development of treatment plan with patient and/or surrogate as well as nursing, discussions with consultants, evaluation of patient's response to treatment, examination of patient, obtaining history from patient or  surrogate, ordering and performing treatments and interventions, ordering and review of laboratory studies, ordering and review of radiographic studies, pulse oximetry and re-evaluation of patient's condition.  Ezra Shuck 12/31/2024 9:52 AM

## 2024-12-31 NOTE — Progress Notes (Signed)
 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 Upon assessment tonight, patient's respiratory status improving, still some increase WOB. Did educate patient and family, would benefit in wearing the BIPAP tonight as well, once ready to sleep.  Suspect with ongoing diuresis- respiratory status has improved, along with BIPAP in evening   Vitals:   12/31/24 1600 12/31/24 1700 12/31/24 1800 12/31/24 1900  BP: (!) 89/76 (!) 77/59 (!) 85/67 (!) 80/67  Pulse:      Temp: 99 F (37.2 C) 99.3 F (37.4 C) 99.1 F (37.3 C) 99.5 F (37.5 C)  Resp: (!) 22 (!) 26 (!) 28 (!) 22  Height:      Weight:      SpO2: 100% 97% 100% 100%  TempSrc: Bladder     BMI (Calculated):         Intake/Output Summary (Last 24 hours) at 12/31/2024 2018 Last data filed at 12/31/2024 1900 Gross per 24 hour  Intake 2764.93 ml  Output 4355 ml  Net -1590.07 ml    P: Heart Failure following, as well as TCTs- appreciate assistance  Continue amio gtt Continue low dose epi  Continue milrinone  gtt  Continue diuresis as tolerated  On broad spectrum abx for 7 days- mero and vanc due to fevers and elevated WBC- continue to avoid fever contributing meds   Sherlean Sharps AGACNP-BC   [facility] 12/15/2024, 8:41 PM  Call Cell: [phone] if have any emergent needs

## 2024-12-31 NOTE — Progress Notes (Signed)
 "  NAME:  Karen Warner, MRN:  [id number], DOB:  [birth year], LOS: 16 ADMISSION DATE:  12/14/2024, CONSULTATION DATE:  1/23 REFERRING MD:  Lucas, CHIEF COMPLAINT:  post-LVAD   History of Present Illness:  [name] is a 74 y/o woman with a history of ICM and HFrEF who presented on 1/14 with chest pain and decompensated heart failure. She underwent left and right heart catheterization this admission showing low CO and low filling pressures.  She was managed with inotropic support. Due to progression of disease and poor tolerance of GDMT, she was evaluated for LVAD.  Intraoperatively her course was uncomplicated other than low flows on LVAD when her BP went up at the end of the case when she was stimulated. She transferred to the ICU on epi 2, NE 1, milrinone  0.325mcg. TEE with normal RV function, evidence of intrapulmonary shunt.  Paralytics not reversed in ICU. Bypass time , crossclamp time 29 min, cell saver 225cc. LVAD speed 4600 RPM. Alarms on LVAD went off during periods of hypertension post-op, controlled by afterload reduction.   Pertinent  Medical History  HFrEF due to ICM DM2 IDA FM CKD3  Significant Hospital Events: Including procedures, antibiotic start and stop dates in addition to other pertinent events   1/14 admission 1/15 L&R heart cath> nonobstructive disease, low filling pressures 1/23 LVAD 1/24 extubated; fluid boluses & milrinone  increased due to frequent LVAD alarms with high PI 1/25 reintubated for respiratory distress, worsening lactic acidosis 1/26 Tmax 100.4 on vanc and zosyn , remains intubated  1/27 extubated, remains with labile blood pressure, worsening fever/tachycardia and hypoxia requiring BiPap and resumption of inhaled nitric, cultured and broadened to vanc and meropenem , some low flows associated with hypertension. Steroids added for fevers 1/28 stopped cleviprex > improved respiratory status (pulmonary shunting) 1/29 Eating jello. Pain is  better today since she could start oral pain meds. SOB is stable.   Interim History / Subjective:  Patient is lying in bed.  She had 4 BMs.  She apparently started eating better.  She is requesting us  to hold tube feeds as she is not tolerating them well.  She says she would rather eat.  Abdominal x-ray did not show anything acute.  She diuresed well.  Respiratory status is improving slowly.  Chest x-ray this morning looks better. She needed BiPAP overnight but currently on nasal cannula 5 L/min and feels better.  Objective    Blood pressure (!) 85/43, pulse (!) 169, temperature 98.8 F (37.1 C), resp. rate 19, height 5' 5 (1.651 m), weight 78.8 kg, SpO2 100%. CVP:  [0 mmHg-11 mmHg] 7 mmHg  Vent Mode: BIPAP;PSV FiO2 (%):  [40 %-50 %] 50 % PEEP:  [5 cmH20] 5 cmH20 Pressure Support:  [8 cmH20] 8 cmH20   Intake/Output Summary (Last 24 hours) at 12/31/2024 1559 Last data filed at 12/31/2024 1500 Gross per 24 hour  Intake 2652.67 ml  Output 5210 ml  Net -2557.33 ml   Filed Weights   12/28/24 0500 12/29/24 0530 12/30/24 0600  Weight: 77.8 kg 78.6 kg 78.8 kg   Examination: Physical exam: General: Chronically ill-appearing female, lying on the bed HEENT: [place]/AT, eyes anicteric.  moist mucus membranes Neuro: Alert, awake following commands Chest: Coarse breath sounds, no wheezes or rhonchi Heart: Tachycardia and rhythm, no murmurs or gallops LVAD hum is heard Abdomen: Soft, mild diffuse tenderness no guarding no rigidity no rebound tenderness., nondistended, bowel sounds present   Resolved problem list   Assessment and Plan   Acute on  chronic HFrEF due to ICM; NYHA class IV heart failure s/p LVAD on 1/23 Plan Continuing IV diuresis with Lasix  and metolazone .  See advanced heart failure Continuing low-dose epinephrine  for RV support, currently off nitroglycerin  Milrinone  discontinued at 0.125 mcg/kg/min. Oral hydralazine  Continue sildenafil  Removing arterial line  today Continuing Coumadin .  Off heparin .  INR 2.1. Continue to trend Co. oximetry today 60. Heart failure decreasing milrinone  today to 0.25 Need to mobilize as tolerated Avoid additional calcium  channel blockers Chest tube management per surgical Team Postoperative analgesia, trying to cut back on this now  Acute respiratory failure with hypoxia;  Likely secondary to pulm edema improving. Plan Continuing diuresis Continued schedule antibiotics for possible pulmonary infection  Continue scheduled bronchodilators with Brovana  and Yupelri  Avoiding calcium  channel blocker, there is concern about this causing shunt effect hypoxia, although now this seems less likely with the pulmonary edema  Sinus tachycardia, even at rates of 130-160 appeared to have P waves on tele, ECG difficult to interpret given VAD artifact; likely related to fever & post-op status.  Plan Amiodarone  per heart failure  Fevers, leukocytosis.  Tmax 99.1, white blood cells still elevated Stop steroids yesterday Plan Continue to trend fever and white blood cell curve Broad-spectrum antibiotics for 7 days, currently on meropenem  and vancomycin  with stop date placed avoiding precedex ; previously d/c several meds that could have contributed to fever  Hyperglycemia; h/o DM. A1c 6.6 Off IV insulin  again Plan On insulin  gtt. Tube feed coverage increased to 8 every 4 Steroids stopped  Resistant sliding scale  Anticipated acute blood loss anemia Plan monitor; transfuse for Hb <7 or hemodynamically significant bleeding  GERD Plan  PPI daily  H/o migraine Plan  stopped PTA ubrelvy    H/o FM Plan  holding PTA trintellix  due to fever concern mobilize, optimize sleep  CKD 3b Plan  Renal dose meds  Avoid nephrotoxins  Strict intake output  A.m. chemistry   At risk for malnutrition  Plan advance diet as tolerated Patient states he wants us  to hold tube feeds as she wants to be able to eat.  Also she states  her abdominal is being bloated and she is discomfort.  Tube feeds will be held for now and we will see how she does eating wise. Abdominal x-ray did not show anything acute.  Deconditioning -may need PT & OT    Tamela Stakes, MD  Attending Physician, Critical Care Medicine [facility] See Amion for pager For contact information, see Amion. If no response to pager, please call PCCM 2H APP. After hours, 7PM- 7AM, please call on call APP for 2H.       "

## 2024-12-31 NOTE — Progress Notes (Addendum)
 HeartMate 3 Rounding Note  Subjective:    Stable night. Was on bipap overnight.  Says she feels better this am. Denies SOB. Ate 40% of breakfast this am.  Milrinone  0.25, epi 0.5. Co-ox 73.6.  -3469 cc yesterday. No wt yet today.  LVAD INTERROGATION:  HeartMate IIl LVAD:  Flow 4.0 liters/min, speed 5150, power 3, PI 4.7.    Objective:    Vital Signs:   Temp:  [97.3 F (36.3 C)-99.1 F (37.3 C)] 99 F (37.2 C) (01/31 0800) Pulse Rate:  [113-127] 127 (01/31 0700) Resp:  [15-29] 29 (01/31 0800) BP: (82-127)/(50-110) 83/50 (01/31 0800) SpO2:  [93 %-99 %] 96 % (01/31 0700) Arterial Line BP: (74-94)/(60-77) 94/74 (01/30 1400) FiO2 (%):  [40 %-50 %] 50 % (01/31 0334) Last BM Date : 12/30/24 Mean arterial Pressure 80's  Intake/Output:   Intake/Output Summary (Last 24 hours) at 12/31/2024 0848 Last data filed at 12/31/2024 0800 Gross per 24 hour  Intake 2504.87 ml  Output 5200 ml  Net -2695.13 ml     Physical Exam: General:  Still looks fatigued but more conversant this am.  Breaths heavy. HEENT: normal Neck: normal. Cor: Distant heart sounds with LVAD hum present. Lungs: clear Abdomen: soft, nontender, nondistended. Good bowel sounds. Extremities: no edema in legs, hands still puffy. Neuro: alert & orientedx3, cranial nerves grossly intact. moves all 4 extremities w/o difficulty. Affect pleasant  Telemetry: internal pacing.  Labs: Basic Metabolic Panel: Recent Labs  Lab 12/27/24 0519 12/27/24 [id number] 12/28/24 0435 12/28/24 0534 12/28/24 1547 12/28/24 1555 12/29/24 0329 12/29/24 0546 12/30/24 0348 12/30/24 0818 12/30/24 1400 12/31/24 0512  NA  --    < > 138   < > 140  140   < > 138 141 142 142 136 135  K  --    < > 3.6   < > 3.7  3.7   < > 4.3 3.8 2.6* 3.5 5.1 3.1*  CL  --    < > 102  --  104  104  --  104  --  100 102 98 89*  CO2  --    < > 23  --  22  22  --  23  --  28 28 26  32  GLUCOSE  --    < > 234*  --  208*  208*  --  254*  --  150* 144* 471*  424*  BUN  --    < > 15  --  16  16  --  22  --  32* 34* 36* 42*  CREATININE  --    < > 1.00  --  1.09*  1.05*  --  1.22*  --  1.25* 1.28* 1.30* 1.25*  CALCIUM   --    < > 8.6*  --  8.6*  8.6*  --  8.5*  --  8.8* 9.1 9.2 9.3  MG 2.3  --  1.8  --  2.2  --  2.3  --  2.1  --   --   --   PHOS 1.8*  --  4.0  --  2.4*  --  2.2*  --  3.2  --   --  2.6   < > = values in this interval not displayed.    Liver Function Tests: Recent Labs  Lab 12/25/24 0414 12/26/24 0454 12/28/24 0810 12/28/24 1547 12/30/24 0348 12/31/24 0512  AST 123* 77* 39  --   --   --   ALT 11 8 6   --   --   --  ALKPHOS 61 68 98  --   --   --   BILITOT 1.6* 0.6 0.5  --   --   --   PROT 6.1* 5.6* 6.6  --   --   --   ALBUMIN  3.9 3.4* 3.6 3.5 3.4* 3.3*   No results for input(s): LIPASE, AMYLASE in the last 168 hours. No results for input(s): AMMONIA in the last 168 hours.  CBC: Recent Labs  Lab 12/26/24 0454 12/26/24 0455 12/27/24 0519 12/27/24 9357 12/28/24 0435 12/28/24 0534 12/28/24 1555 12/29/24 0329 12/29/24 0546 12/30/24 0348 12/31/24 0512  WBC 21.0*  --  15.2*  --  17.4*  --   --  15.6*  --  19.0* 17.6*  NEUTROABS 17.1*  --  11.2*  --  13.9*  --   --  11.9*  --  14.1*  --   HGB 7.6*   < > 8.9*   < > 9.5*   < > 9.5* 8.7* 9.2* 9.1* 9.2*  HCT 22.7*   < > 27.5*   < > 28.5*   < > 28.0* 26.5* 27.0* 28.0* 28.5*  MCV 94.2  --  92.9  --  90.2  --   --  91.1  --  92.1 91.1  PLT 184  --  210  --  248  --   --  308  --  378 439*   < > = values in this interval not displayed.    INR: Recent Labs  Lab 12/27/24 0519 12/28/24 0435 12/29/24 0329 12/30/24 0348 12/31/24 0512  INR 1.3* 1.4* 1.6* 2.0* 2.1*    Other results: EKG:   Imaging: No results found.   Medications:     Scheduled Medications:  arformoterol   15 mcg Nebulization BID   Chlorhexidine  Gluconate Cloth  6 each Topical Daily   docusate  100 mg Per Tube Daily   feeding supplement (PIVOT 1.5 CAL)  1,000 mL Per Tube Q24H    gabapentin   200 mg Per Tube QHS   hydrALAZINE   50 mg Per Tube Q8H   lidocaine   2 patch Transdermal Q24H   mouth rinse  15 mL Mouth Rinse 4 times per day   pantoprazole  (PROTONIX ) IV  40 mg Intravenous Q24H   polyethylene glycol  17 g Per Tube Daily   potassium chloride   40 mEq Oral Q3H   revefenacin   175 mcg Nebulization Daily   sildenafil   20 mg Per Tube TID   sodium chloride  flush  10-40 mL Intracatheter Q12H   sodium chloride  flush  3 mL Intravenous Q12H   Vitamin D  (Ergocalciferol )  50,000 Units Oral Q Sun   Warfarin - Pharmacist Dosing Inpatient   Does not apply q1600    Infusions:  amiodarone  30 mg/hr (12/31/24 0800)   epinephrine  0.5 mcg/min (12/31/24 0800)   furosemide  (LASIX ) 200 mg in dextrose  5 % 100 mL (2 mg/mL) infusion 20 mg/hr (12/31/24 0800)   insulin  6.5 Units/hr (12/31/24 0800)   meropenem  (MERREM ) IV Stopped (12/30/24 2310)   milrinone  0.25 mcg/kg/min (12/31/24 0800)   potassium chloride  10 mEq (12/31/24 0843)   vancomycin  Stopped (12/30/24 1646)    PRN Medications: dextrose , fentaNYL  (SUBLIMAZE ) injection, hydrALAZINE , LORazepam , ondansetron  (ZOFRAN ) IV, mouth rinse, oxyCODONE , sodium chloride  flush, sodium chloride  flush   Assessment:   POD 8 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 Normal RV. Preop LVEF 20-25%. She has CRT-D  device and Barostim.   CAD with stable coronary anatomy per recent cath.  COPD with previous smoking until 2021.   Stage 3 CKD with creat around 1.5-1.6 preop.   Preop RUQ and epigastric abdominal pain with no evidence of acute cholecystitis on US  . Seen by GI. Felt to possibly be related to low CO.   Obesity.    Acute postop respiratory failure requiring reintubation over the weekend. Appeared to be developing sepsis with fever, leukocytosis, increased lactic acid. Started on vanc and Zosyn  with quick improvement in lactic acid. Temp curve improving with Tmax 99.1 the past 2 days.  On Merrem   and Vanc. BC neative so far. This could just be SIRS early postop. Has foley from OR.  All other lines removed and PICC is new. She received some steroids. WBC up a bit since which could be related to that.    Acute postop blood loss anemia improved with transfusion and stable.  Plan/Discussion:    Hemodynamics stable on current inotropes.  Her main issue now is respiratory status and mobilization. CXR yesterday looked bad with edema and atelectasis. Diuresed a lot and CVP is low. She had a small complex left pleural effusion on US  done in ICU. I don't this this is large enough to warrant thoracentesis which would be higher risk given her body habitus and anticoagulation.  Continuing tube feeds for nutrition in addition to po diet.  INR therapeutic. Coumadin  per pharmacy.  I reviewed the LVAD parameters from today, and compared the results to the patient's prior recorded data.  No programming changes were made.  The LVAD is functioning within specified parameters.   Length of Stay: [address]  Dorise POUR [facility] 12/31/2024, 8:48 AM

## 2024-12-31 NOTE — Progress Notes (Signed)
 Drive Line: Existing VAD dressing removed and site care performed using sterile technique by daughter Jon with nurse champion assisting. Drive line exit site cleaned with Chlora prep applicators x 2, allowed to dry, and Silverlon patch with gauze dressing applied. Exit site unincorporated, the velour is fully implanted at exit site. Moderate amount of sanguinous drainage noted on previous dressing. No redness, tenderness, foul odor or rash noted. Drive line anchor re-applied. Continue daily dressing changes by VAD coordinator or nurse champion. Next dressing change due 01/01/25.   Karen Warner PEAK

## 2025-01-01 ENCOUNTER — Inpatient Hospital Stay (HOSPITAL_COMMUNITY)

## 2025-01-01 ENCOUNTER — Encounter (HOSPITAL_COMMUNITY): Payer: Self-pay | Admitting: Surgery

## 2025-01-01 DIAGNOSIS — E1159 Type 2 diabetes mellitus with other circulatory complications: Secondary | ICD-10-CM | POA: Diagnosis not present

## 2025-01-01 DIAGNOSIS — I255 Ischemic cardiomyopathy: Secondary | ICD-10-CM | POA: Diagnosis not present

## 2025-01-01 DIAGNOSIS — D62 Acute posthemorrhagic anemia: Secondary | ICD-10-CM | POA: Diagnosis not present

## 2025-01-01 DIAGNOSIS — J9601 Acute respiratory failure with hypoxia: Secondary | ICD-10-CM | POA: Diagnosis not present

## 2025-01-01 DIAGNOSIS — A419 Sepsis, unspecified organism: Secondary | ICD-10-CM | POA: Diagnosis not present

## 2025-01-01 DIAGNOSIS — Z95811 Presence of heart assist device: Secondary | ICD-10-CM | POA: Diagnosis not present

## 2025-01-01 DIAGNOSIS — I5023 Acute on chronic systolic (congestive) heart failure: Secondary | ICD-10-CM | POA: Diagnosis not present

## 2025-01-01 DIAGNOSIS — N1832 Chronic kidney disease, stage 3b: Secondary | ICD-10-CM | POA: Diagnosis not present

## 2025-01-01 DIAGNOSIS — Z794 Long term (current) use of insulin: Secondary | ICD-10-CM | POA: Diagnosis not present

## 2025-01-01 DIAGNOSIS — R109 Unspecified abdominal pain: Secondary | ICD-10-CM | POA: Diagnosis not present

## 2025-01-01 DIAGNOSIS — R000 Tachycardia, unspecified: Secondary | ICD-10-CM | POA: Diagnosis not present

## 2025-01-01 DIAGNOSIS — D72829 Elevated white blood cell count, unspecified: Secondary | ICD-10-CM | POA: Diagnosis not present

## 2025-01-01 LAB — CG4 I-STAT (LACTIC ACID)
Lactic Acid, Venous: 1.2 mmol/L (ref 0.5–1.9)
Lactic Acid, Venous: 1.6 mmol/L (ref 0.5–1.9)

## 2025-01-01 LAB — GLUCOSE, CAPILLARY
Glucose-Capillary: 102 mg/dL — ABNORMAL HIGH (ref 70–99)
Glucose-Capillary: 111 mg/dL — ABNORMAL HIGH (ref 70–99)
Glucose-Capillary: 113 mg/dL — ABNORMAL HIGH (ref 70–99)
Glucose-Capillary: 122 mg/dL — ABNORMAL HIGH (ref 70–99)
Glucose-Capillary: 124 mg/dL — ABNORMAL HIGH (ref 70–99)
Glucose-Capillary: 127 mg/dL — ABNORMAL HIGH (ref 70–99)
Glucose-Capillary: 127 mg/dL — ABNORMAL HIGH (ref 70–99)
Glucose-Capillary: 143 mg/dL — ABNORMAL HIGH (ref 70–99)
Glucose-Capillary: 148 mg/dL — ABNORMAL HIGH (ref 70–99)
Glucose-Capillary: 161 mg/dL — ABNORMAL HIGH (ref 70–99)
Glucose-Capillary: 168 mg/dL — ABNORMAL HIGH (ref 70–99)
Glucose-Capillary: 187 mg/dL — ABNORMAL HIGH (ref 70–99)
Glucose-Capillary: 209 mg/dL — ABNORMAL HIGH (ref 70–99)
Glucose-Capillary: 275 mg/dL — ABNORMAL HIGH (ref 70–99)
Glucose-Capillary: 305 mg/dL — ABNORMAL HIGH (ref 70–99)
Glucose-Capillary: 98 mg/dL (ref 70–99)

## 2025-01-01 LAB — RENAL FUNCTION PANEL
Albumin: 3.4 g/dL — ABNORMAL LOW (ref 3.5–5.0)
Anion gap: 17 — ABNORMAL HIGH (ref 5–15)
BUN: 47 mg/dL — ABNORMAL HIGH (ref 8–23)
CO2: 28 mmol/L (ref 22–32)
Calcium: 9 mg/dL (ref 8.9–10.3)
Chloride: 89 mmol/L — ABNORMAL LOW (ref 98–111)
Creatinine, Ser: 1.38 mg/dL — ABNORMAL HIGH (ref 0.44–1.00)
GFR, Estimated: 40 mL/min — ABNORMAL LOW
Glucose, Bld: 237 mg/dL — ABNORMAL HIGH (ref 70–99)
Phosphorus: 3.8 mg/dL (ref 2.5–4.6)
Potassium: 3.6 mmol/L (ref 3.5–5.1)
Sodium: 134 mmol/L — ABNORMAL LOW (ref 135–145)

## 2025-01-01 LAB — COOXEMETRY PANEL
Carboxyhemoglobin: 1.3 % (ref 0.5–1.5)
Carboxyhemoglobin: 0.6 % (ref 0.5–1.5)
Methemoglobin: <0.7 % (ref 0.0–1.5)
Methemoglobin: <0.7 % (ref 0.0–1.5)
O2 Saturation: 52.9 %
O2 Saturation: 58.6 %
Total hemoglobin: 10.4 g/dL — ABNORMAL LOW (ref 12.0–16.0)
Total hemoglobin: 10.2 g/dL — ABNORMAL LOW (ref 12.0–16.0)

## 2025-01-01 LAB — LACTATE DEHYDROGENASE: LDH: 426 U/L — ABNORMAL HIGH (ref 105–235)

## 2025-01-01 LAB — CULTURE, BLOOD (ROUTINE X 2): Culture: NO GROWTH

## 2025-01-01 LAB — PROTIME-INR
INR: 2.1 — ABNORMAL HIGH (ref 0.8–1.2)
Prothrombin Time: 24.3 s — ABNORMAL HIGH (ref 11.4–15.2)

## 2025-01-01 LAB — MAGNESIUM: Magnesium: 2 mg/dL (ref 1.7–2.4)

## 2025-01-01 MED ORDER — CALCIUM CARBONATE ANTACID 500 MG PO CHEW
1.0000 | CHEWABLE_TABLET | Freq: Once | ORAL | Status: AC
  Administered 2025-01-01: 200 mg via ORAL
  Filled 2025-01-01: qty 1

## 2025-01-01 MED ORDER — WARFARIN SODIUM 2.5 MG PO TABS
2.5000 mg | ORAL_TABLET | Freq: Once | ORAL | Status: AC
  Administered 2025-01-01: 2.5 mg via ORAL
  Filled 2025-01-01: qty 1

## 2025-01-01 MED ORDER — POTASSIUM CHLORIDE 20 MEQ PO PACK
20.0000 meq | PACK | ORAL | Status: AC
  Administered 2025-01-01 (×3): 20 meq
  Filled 2025-01-01 (×2): qty 1

## 2025-01-01 MED ORDER — INSULIN GLARGINE-YFGN 100 UNIT/ML ~~LOC~~ SOLN
24.0000 [IU] | Freq: Every day | SUBCUTANEOUS | Status: DC
  Administered 2025-01-01 – 2025-01-04 (×4): 24 [IU] via SUBCUTANEOUS
  Filled 2025-01-01 (×5): qty 0.24

## 2025-01-01 MED ORDER — MILRINONE LACTATE IN DEXTROSE 20-5 MG/100ML-% IV SOLN
0.1250 ug/kg/min | INTRAVENOUS | Status: AC
  Administered 2025-01-01 – 2025-01-06 (×5): 0.125 ug/kg/min via INTRAVENOUS
  Filled 2025-01-01 (×5): qty 100

## 2025-01-01 MED ORDER — INSULIN ASPART 100 UNIT/ML IJ SOLN
0.0000 [IU] | INTRAMUSCULAR | Status: AC
  Administered 2025-01-01: 2 [IU] via SUBCUTANEOUS
  Administered 2025-01-01: 8 [IU] via SUBCUTANEOUS
  Administered 2025-01-01: 12 [IU] via SUBCUTANEOUS
  Administered 2025-01-02: 2 [IU] via SUBCUTANEOUS
  Administered 2025-01-02 (×2): 8 [IU] via SUBCUTANEOUS
  Administered 2025-01-02: 4 [IU] via SUBCUTANEOUS
  Administered 2025-01-02: 16 [IU] via SUBCUTANEOUS
  Administered 2025-01-02: 4 [IU] via SUBCUTANEOUS
  Administered 2025-01-03: 2 [IU] via SUBCUTANEOUS
  Administered 2025-01-03: 8 [IU] via SUBCUTANEOUS
  Administered 2025-01-03 (×2): 4 [IU] via SUBCUTANEOUS
  Administered 2025-01-04 – 2025-01-06 (×8): 2 [IU] via SUBCUTANEOUS
  Administered 2025-01-06: 8 [IU] via SUBCUTANEOUS
  Filled 2025-01-01: qty 1
  Filled 2025-01-01: qty 8
  Filled 2025-01-01 (×2): qty 4
  Filled 2025-01-01 (×2): qty 2
  Filled 2025-01-01: qty 8
  Filled 2025-01-01: qty 3
  Filled 2025-01-01: qty 12
  Filled 2025-01-01: qty 13
  Filled 2025-01-01: qty 2
  Filled 2025-01-01: qty 8
  Filled 2025-01-01: qty 16
  Filled 2025-01-01: qty 4
  Filled 2025-01-01 (×2): qty 2
  Filled 2025-01-01: qty 4
  Filled 2025-01-01: qty 2
  Filled 2025-01-01 (×2): qty 4
  Filled 2025-01-01: qty 8
  Filled 2025-01-01: qty 2

## 2025-01-01 NOTE — Progress Notes (Signed)
" °  Drive Line: Existing VAD dressing removed and site care performed using sterile technique by nurse champion. Drive line exit site cleaned with Chlora prep applicators x 2, allowed to dry, and Silverlon patch with gauze dressing applied. Exit site unincorporated, the velour is fully implanted at exit site. Moderate amount of sanguinous drainage noted on previous dressing. No redness, tenderness, foul odor or rash noted. Drive line anchor re-applied. Continue daily dressing changes by VAD coordinator or nurse champion. Next dressing change due 01/02/25.    Karen Warner PEAK     "

## 2025-01-01 NOTE — Progress Notes (Signed)
 9 Days Post-Op Procedures (LRB): INSERTION OF IMPLANTABLE LEFT VENTRICULAR ASSIST DEVICE HEARTMATE 3 (N/A) ECHOCARDIOGRAM, TRANSESOPHAGEAL, INTRAOPERATIVE (N/A) Subjective: Feels a little better  Objective: Vital signs in last 24 hours: Temp:  [96.8 F (36 C)-99.7 F (37.6 C)] 98.2 F (36.8 C) (02/01 1628) Pulse Rate:  [97-200] 108 (02/01 1200) Cardiac Rhythm: Sinus tachycardia (02/01 1200) Resp:  [19-37] 37 (02/01 1200) BP: (57-140)/(15-125) 85/55 (02/01 1200) SpO2:  [75 %-100 %] 99 % (02/01 1200) FiO2 (%):  [50 %] 50 % (01/31 2230) Weight:  [79.4 kg] 79.4 kg (02/01 0450)  Hemodynamic parameters for last 24 hours: CVP:  [0 mmHg-15 mmHg] 0 mmHg  LVAD Speed 5200 RPM Flow 3.4 L/min Power: PI 3.6  Intake/Output from previous day: 01/31 0701 - 02/01 0700 In: 2618.8 [P.O.:400; I.V.:998.1; NG/GT:720.7; IV Piggyback:500] Out: 3230 [Urine:3230] Intake/Output this shift: Total I/O In: 219.6 [I.V.:119.6; IV Piggyback:100] Out: 575 [Urine:575]  General appearance: alert, cooperative, and no distress Neurologic: intact Heart: VAD hum Lungs: diminished breath sounds bibasilar  Lab Results: Recent Labs    12/30/24 0348 12/31/24 0512  WBC 19.0* 17.6*  HGB 9.1* 9.2*  HCT 28.0* 28.5*  PLT 378 439*   BMET:  Recent Labs    12/31/24 1620 01/01/25 0427  NA 135 134*  K 4.8 3.6  CL 93* 89*  CO2 31 28  GLUCOSE 206* 237*  BUN 47* 47*  CREATININE 1.32* 1.38*  CALCIUM  9.6 9.0    PT/INR:  Recent Labs    01/01/25 0427  LABPROT 24.3*  INR 2.1*   ABG    Component Value Date/Time   PHART 7.461 (H) 12/29/2024 0546   HCO3 22.3 12/29/2024 0546   TCO2 23 12/29/2024 0546   ACIDBASEDEF 1.0 12/29/2024 0546   O2SAT 58.6 01/01/2025 0846   CBG (last 3)  Recent Labs    01/01/25 1026 01/01/25 1150 01/01/25 1627  GLUCAP 127* 143* 275*    Assessment/Plan: S/P Procedures (LRB): INSERTION OF IMPLANTABLE LEFT VENTRICULAR ASSIST DEVICE HEARTMATE 3 (N/A) ECHOCARDIOGRAM,  TRANSESOPHAGEAL, INTRAOPERATIVE (N/A) POD # 9Continues to be slow to progress  LVAD functioning well. Initial co-ox 53 but 59 on repeat CVP < 5 On milrinone  0.125 Lasix  drip stopped INR 2.1 Creatinine 1.38 (1.32 yesterday)- Lasix  on hold Eating better but resistant to mobilization Afebrile on meropenem  and vancomycin    LOS: 17 days    Karen Warner 01/01/2025

## 2025-01-01 NOTE — Progress Notes (Signed)
 Patient ID: Karen Warner Karen Warner, female   DOB: 05-12-1951, 74 y.o.   MRN: [id number]     Advanced Heart Failure Rounding Note  Cardiologist: Dub Huntsman, DO  [facility] Cardiologist: Dr Rolan    Patient Profile   Karen Warner is a 74 y.o. female with history of CAD and ischemic cardiomyopathy, EF <20%, CKD III and COPD admitted w/ a/c CHF, chest/abdominal pain. RHC c/w low output.   Significant events:   1/15: RHC (RA 2, PA 21/10, PCW 3,  PA sat 59%, TD CI 1.49, FICK CI 1.88, PAPi 5.5), started on milrinone            LHC patent stents, nonobstructive CAD   1/19: co-ox persistently in 40s, milrinone  increased 0.375 12/23/24: S/P HMIII Implant.Intra-op speed placed at 4700 due to low filling pressures.  Received 2UPRBC, 3 FFP, cellsaver, 1 albumin , and 4 FFP  12/23/24 POCUS: VAD well positioned. RV looked ok but PAPI low with high CVP 12/24/24: Extubated. Had some low flows. Cleveprex started 1/25: Re-intubated with desaturation, increased work of breathing 1/26: Ramp echo, speed increased to 5100 rpm.  1 unit PRBCs. 1/27: Extubated. Increased work of breathing, back on Bipap 1/28: Stopped Clevidipine   Subjective:    POD #9  Co-ox 59% on 0.125 milrinone  + 0.5 epinephrine .  MAP 70s.    Breathing better today, not tachypneic.  CVP < 5.  Net negative 611 cc last 24 hrs with lasix  gtt at 10 mg/hr, no metolazone  yesterday. Creatinine 1.25 => 1.38.   Had some abdominal pain this morning but improved after bowel movement.  Still feels bloated.   She remains on vancomycin /meropenem  for PNA coverage. Still on 5L [place].   She ate more breakfast this morning, getting TFs as well.   She got up to the chair this morning, had 2 low flow events after getting up.  She has not been very motivated to try to walk.   HeartMate 2 VAD Equipment Check Pump Speed (RPM): 4600 RPM HeartMate 3 VAD Equipment Check Pump Speed (RPM): 5200 RPM Pump Flow (LPM): 3.4 Power (Watts): 3  Watts Pulsatility Index: 3.6 Fixed Speed Limit: 5200 rpm Low Speed Limit: 4800 rpm Alarms: No alarms Auscultated: Normal expected humming Power Module Self-Test (Daily): Done System Controller Self-Test: Passed Patient Battery Source: [facility] / Wall unit Emergency Equipment at Bedside: Yes 2 low flow events when she stood up to get to chair.  LDH 640 => 641 => 426 INR 2.1   Objective:   Weight Range: 79.4 kg Body mass index is 29.13 kg/m.   Vital Signs:   Temp:  [96.8 F (36 C)-99.7 F (37.6 C)] 98.4 F (36.9 C) (02/01 0800) Pulse Rate:  [97-200] 103 (02/01 0800) Resp:  [19-33] 20 (02/01 0800) BP: (57-140)/(15-125) 75/54 (02/01 0800) SpO2:  [75 %-100 %] 100 % (02/01 0800) FiO2 (%):  [50 %] 50 % (01/31 2230) Weight:  [79.4 kg] 79.4 kg (02/01 0450) Last BM Date : 01/01/25  Weight change: Filed Weights   12/29/24 0530 12/30/24 0600 01/01/25 0450  Weight: 78.6 kg 78.8 kg 79.4 kg    Intake/Output:   Intake/Output Summary (Last 24 hours) at 01/01/2025 1009 Last data filed at 01/01/2025 1000 Gross per 24 hour  Intake 1823.63 ml  Output 3240 ml  Net -1416.37 ml     Physical Exam   General: Well appearing this am. NAD.  HEENT: Normal. Neck: Supple, JVP 7-8 cm. Carotids OK.  Cardiac:  Mechanical heart sounds with LVAD hum present.  Lungs:  Decreased BS bilaterally.  Abdomen:  NT, ND, no HSM. No bruits or masses. +BS  LVAD exit site: Well-healed and incorporated. Dressing dry and intact. No erythema or drainage. Stabilization device present and accurately applied. Driveline dressing changed daily per sterile technique. Extremities:  Warm and dry. No cyanosis, clubbing, rash, or edema.  Neuro:  Alert & oriented x 3. Cranial nerves grossly intact. Moves all 4 extremities w/o difficulty. Affect pleasant    Telemetry   ST 100s, BiV paced (personally reviewed)  Labs   CBC Recent Labs    12/30/24 0348 12/31/24 0512  WBC 19.0* 17.6*  NEUTROABS 14.1*  --   HGB 9.1* 9.2*   HCT 28.0* 28.5*  MCV 92.1 91.1  PLT 378 439*   Basic Metabolic Panel Recent Labs    98/69/73 0348 12/30/24 0818 12/31/24 0512 12/31/24 1620 01/01/25 0427  NA 142   < > 135 135 134*  K 2.6*   < > 3.1* 4.8 3.6  CL 100   < > 89* 93* 89*  CO2 28   < > 32 31 28  GLUCOSE 150*   < > 424* 206* 237*  BUN 32*   < > 42* 47* 47*  CREATININE 1.25*   < > 1.25* 1.32* 1.38*  CALCIUM  8.8*   < > 9.3 9.6 9.0  MG 2.1  --   --   --  2.0  PHOS 3.2  --  2.6  --  3.8   < > = values in this interval not displayed.   Liver Function Tests Recent Labs    12/31/24 0512 01/01/25 0427  ALBUMIN  3.3* 3.4*   No results for input(s): LIPASE, AMYLASE in the last 72 hours. Cardiac Enzymes No results for input(s): CKTOTAL, CKMB, CKMBINDEX, TROPONINI in the last 72 hours.  BNP: BNP (last 3 results) Recent Labs    09/01/24 1457  BNP 169.4*    ProBNP (last 3 results) Recent Labs    12/14/24 0826 12/24/24 0406 12/30/24 0348  PROBNP 1,539.0* 3,345.0* 14,579.0*     D-Dimer No results for input(s): DDIMER in the last 72 hours.  Hemoglobin A1C No results for input(s): HGBA1C in the last 72 hours. Fasting Lipid Panel No results for input(s): CHOL, HDL, LDLCALC, TRIG, CHOLHDL, LDLDIRECT in the last 72 hours. Medications:   Scheduled Medications:  acetaminophen  (TYLENOL ) oral liquid 160 mg/5 mL  650 mg Per Tube Q6H   arformoterol   15 mcg Nebulization BID   Chlorhexidine  Gluconate Cloth  6 each Topical Daily   docusate  100 mg Per Tube Daily   feeding supplement (PIVOT 1.5 CAL)  1,000 mL Per Tube Q24H   gabapentin   200 mg Per Tube QHS   insulin  aspart  0-24 Units Subcutaneous Q4H   insulin  glargine-yfgn  24 Units Subcutaneous Daily   lidocaine   2 patch Transdermal Q24H   mouth rinse  15 mL Mouth Rinse 4 times per day   pantoprazole  (PROTONIX ) IV  40 mg Intravenous Q24H   polyethylene glycol  17 g Per Tube Daily   potassium chloride   20 mEq Per Tube Q4H    revefenacin   175 mcg Nebulization Daily   sildenafil   20 mg Per Tube TID   sodium chloride  flush  10-40 mL Intracatheter Q12H   sodium chloride  flush  3 mL Intravenous Q12H   Vitamin D  (Ergocalciferol )  50,000 Units Oral Q Sun   Warfarin - Pharmacist Dosing Inpatient   Does not apply q1600    Infusions:  amiodarone   30 mg/hr (01/01/25 1000)   epinephrine  0.5 mcg/min (01/01/25 1000)   meropenem  (MERREM ) IV Stopped (12/31/24 2134)   milrinone      vancomycin  Stopped (12/31/24 1733)    PRN Medications: dextrose , fentaNYL  (SUBLIMAZE ) injection, Gerhardt's butt cream, hydrALAZINE , LORazepam , ondansetron  (ZOFRAN ) IV, mouth rinse, oxyCODONE , sodium chloride  flush, sodium chloride  flush     Assessment/Plan   1. 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  - Intra Op Speed 4600 due to low filling pressures.  - Ramp echo 1/26: RV small, moderate dysfunction; interventricular septum bows to the right; the aortic valve does not open. Speed increased from 4800 step-wise to 5100.  Flow increased 3.0 L/min => 3.7 L/min, no ectopy.  LVIDD decreased appropriately.  At 5100 rpm, the IV septum still had some rightward shift but less than initially and the RV appeared to fill more.  - Speed increased to 5200 rpm under echo guidance on 1/31.  - Currently on milrinone  0.125 + epinephrine  0.5. Co-ox 59% today. Will not change today.  - Oxygen saturation improved off Clevidipine  => some concern this was causing pulmonary A-V shunting.   - MAP difficult to obtain, appears to be around 70.   - CVP now < 5, had 2 low flow events when stood up and sat in chair.  Stop Lasix  gtt.  Will likely need to start torsemide  tomorrow.  - Continue sildenafil  20 TID for RV support.  - Continue warfarin, INR 2.1.   2.  Acute on chronic systolic CHF: Ischemic cardiomyopathy.  [employer] CRT-D device  - She is end-stage NYHA IV.  - s/p HM-3 VAD - management as above  3.  Acute hypoxic respiratory failure - reintubated 1/25, extubated 1/27.  - On vancomycin /meropenem  to cover for possible PNA.  - She did not have enough fluid for thoracentesis.  - Repeat pCXR today, still on 5L [place].     4. CAD: PCI to mid/distal LAD in 2020 and proximal LAD in 2021.   Cath this admit showed stable coronary anatomy. Suspect CP related to HF as above - Off ranolazine , imdur , plavix  - Eventually add back crestor  and zetia   5. COPD: History of smoking, quit 2021.    6. CKD 3: Follows with nephology at [facility].  - Mildly higher at 1.38 with diuresis.  Would hold Lasix  today with CVP < 5.   7. Obesity: Has been on semaglutide. Now on hold.   8. PAD: Moderately decreased ABI on right in 7/25. Medical mgmt per Dr. Serene. Again ABI on 1/19, the R had moderate disease, and mild on the L. No claudication or other PAD symptoms.   9. Anemia: Post-op, 1 unit PRBCs 1/25.  Hgb 9.2  - Transfuse Hgb < 8.   10. FEN: TFs ongoing.  - Encourage PO intake, seems to be eating better.   She has been refusing mobilization.  She is up in chair this morning.  Needs to try to walk later on.  Strongly encouraged mobilization and use of incentive spirometer. Needs aggressive PT.    CRITICAL CARE Performed by: Karen Warner  Total critical care time: 45 minutes  Critical care time was exclusive of separately billable procedures and treating other patients.  Critical care was necessary to treat or prevent imminent or life-threatening deterioration.  Critical care was time spent personally by me on the following activities: development of treatment plan with patient and/or surrogate as well as nursing, discussions with consultants, evaluation of  patient's response to treatment, examination of patient, obtaining history from patient or surrogate, ordering and performing treatments and interventions, ordering and review of laboratory studies, ordering and review of radiographic studies, pulse  oximetry and re-evaluation of patient's condition.  Karen Warner 01/01/2025 10:09 AM

## 2025-01-01 NOTE — Progress Notes (Signed)
" °   01/01/25 2320  BiPAP/CPAP/SIPAP  $ Non-Invasive Ventilator  Non-Invasive Vent Subsequent  BiPAP/CPAP/SIPAP Pt Type Adult  BiPAP/CPAP/SIPAP SERVO  Mask Type Full face mask  Dentures removed? Not applicable  Mask Size Medium  Respiratory Rate 21 breaths/min  IPAP 13 cmH20  EPAP 5 cmH2O  Pressure Support 8 cmH20  PEEP 5 cmH20  FiO2 (%) 50 %  Flow Rate 0 lpm  Minute Ventilation 12.2  Leak 52  Peak Inspiratory Pressure (PIP) 13  Tidal Volume (Vt) 505  Patient Home Machine No  Patient Home Mask No  Patient Home Tubing No  Auto Titrate No  Press High Alarm 25 cmH2O  CPAP/SIPAP surface wiped down Yes  Device Plugged into RED Power Outlet Yes  BiPAP/CPAP /SiPAP Vitals  Temp 98.3 F (36.8 C)  MEWS Score/Color  MEWS Score 4  MEWS Score Color Red    "

## 2025-01-01 NOTE — Progress Notes (Signed)
 PHARMACY - ANTICOAGULATION CONSULT NOTE  Pharmacy Consult for heparin  bridge to warfarin  Indication: LVAD  Allergies[1]  Patient Measurements: Height: 5' 5 (165.1 cm) Weight: 79.4 kg (175 lb 0.7 oz) IBW/kg (Calculated) : 57 HEPARIN  DW (KG): 73.7  Vital Signs: Temp: 98.4 F (36.9 C) (02/01 0800) BP: 75/54 (02/01 0800) Pulse Rate: 103 (02/01 0800)  Labs: Recent Labs    12/30/24 0348 12/30/24 0818 12/31/24 0512 12/31/24 1620 01/01/25 0427  HGB 9.1*  --  9.2*  --   --   HCT 28.0*  --  28.5*  --   --   PLT 378  --  439*  --   --   LABPROT 23.6*  --  24.8*  --  24.3*  INR 2.0*  --  2.1*  --  2.1*  HEPARINUNFRC 0.10*  --   --   --   --   CREATININE 1.25*   < > 1.25* 1.32* 1.38*   < > = values in this interval not displayed.    Estimated Creatinine Clearance: 37.3 mL/min (A) (by C-G formula based on SCr of 1.38 mg/dL (H)).   Medical History: Past Medical History:  Diagnosis Date   AICD (automatic cardioverter/defibrillator) present    Anemia 12/12/2019   Anginal pain    Arthralgia 01/14/2020   Arthritis    Asthma    Atherosclerotic heart disease of native coronary artery without angina pectoris 04/13/2014   CHF (congestive heart failure) (HCC)    Chronic bilateral low back pain with bilateral sciatica 01/13/2019   Chronic bladder pain 11/08/2014   Last Assessment & Plan:  Formatting of this note might be different from the original. Patient has chronic pain syndrome in general I think this is unfortunately worsening her pelvic pain and bladder pain her examination today was very reassuring I am advised her to use the estrogen cream I did start her on Elavil 10 milligrams at that time if she does tolerated will increase it to 25 milligrams.    Chronic headaches    Chronic idiopathic constipation 12/22/2014   Formatting of this note might be different from the original. Last Assessment & Plan:  For better bowel emptying please use either Citrucel / Benefiber start with  2 tablespoons daily and titrate up or down to effect.  Also please use glycerin  suppositories as needed to assist in evacuation. Last Assessment & Plan:  Formatting of this note might be different from the original. For better bowel empt   Chronic obstructive pulmonary disease, unspecified (HCC) 03/18/2018   CKD (chronic kidney disease) stage 3, GFR 30-59 ml/min (HCC)    Class 1 obesity due to excess calories with serious comorbidity and body mass index (BMI) of 31.0 to 31.9 in adult 06/26/2020   Colon polyps    COPD (chronic obstructive pulmonary disease) (HCC)    Coronary artery disease    DDD (degenerative disc disease), cervical 01/13/2019   Depression 11/05/2019   Diabetic polyneuropathy associated with type 2 diabetes mellitus (HCC) 02/24/2019   Diverticulitis 05/20/2018   Diverticulitis of colon 03/18/2018   Family history of ischemic heart disease (IHD) 08/16/2013   Fibromyalgia    Fibromyalgia affecting multiple sites 01/14/2020   Gastro-esophageal reflux disease without esophagitis 01/13/2019   Glaucoma 11/08/2014   Hordeolum externum of left upper eyelid 03/13/2020   Hypertension 11/08/2014   IBS (irritable bowel syndrome)    Iron deficiency anemia 01/13/2019   Left renal mass 11/08/2019   Leukocytosis 05/28/2018   Low vitamin B12 level 03/13/2020  Low vitamin D  level 12/12/2019   LV dysfunction 05/31/2019   Malignant essential hypertension 01/22/2016   Migraine, unspecified, not intractable, without status migrainosus 11/08/2014   Mixed hyperlipidemia 01/13/2019   Myocardial infarction [facility])    Other premature beats 11/08/2014   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 Last Assessment & Plan:  For vaginal atrophy please place a pea size dab of Estrogen vaginal cream  into the vagina 3 times a week ( Monday, Wednesday, Friday) Last Assessment & Plan:  Formatting of this note might be different from the original. For vaginal atrophy please place a pea size dab of Estrogen vaginal cream  into the vagina     Medications:  Scheduled:   acetaminophen  (TYLENOL ) oral liquid 160 mg/5 mL  650 mg Per Tube Q6H   arformoterol   15 mcg Nebulization BID   Chlorhexidine  Gluconate Cloth  6 each Topical Daily   docusate  100 mg Per Tube Daily   feeding supplement (PIVOT 1.5 CAL)  1,000 mL Per Tube Q24H   gabapentin   200 mg Per Tube QHS   insulin  aspart  0-24 Units Subcutaneous Q4H   insulin  glargine-yfgn  24 Units Subcutaneous Daily   lidocaine   2 patch Transdermal Q24H   mouth rinse  15 mL Mouth Rinse 4 times per day   pantoprazole  (PROTONIX ) IV  40 mg Intravenous Q24H   polyethylene glycol  17 g Per Tube Daily   potassium chloride   20 mEq Per Tube Q4H   revefenacin   175 mcg Nebulization Daily   sildenafil   20 mg Per Tube TID   sodium chloride  flush  10-40 mL Intracatheter Q12H   sodium chloride  flush  3 mL Intravenous Q12H   Vitamin D  (Ergocalciferol )  50,000 Units Oral Q Sun   Warfarin - Pharmacist Dosing Inpatient   Does not apply q1600    Assessment: Patient is a 74 year old female with new LVAD placement 1/23. She was not on anticoagulation PTA. Pharmacy is consulted to dose warfarin for LVAD.   -INR= 2.1, hg= 9.2  Goal of Therapy:  INR 2-2.5   Monitor platelets by anticoagulation protocol: Yes   Plan:  Give Warfarin 2.5 mg tonight. .  Monitor daily INR  Prentice Poisson, PharmD Clinical Pharmacist **Pharmacist phone directory can now be found on [email or web address] ([email or web address]).  Listed under [facility].             [1]  Allergies Allergen Reactions   Bee Venom Anaphylaxis    Sumatriptan Shortness Of Breath    Migraine worsened   Amoxicillin-Pot Clavulanate Diarrhea and Nausea And Vomiting    GI Intolerance   Oxycodone  Itching and Hives    Other reaction(s): Other (see comments) Funny feeling in head  off the edge    Buprenorphine Hcl     Other reaction(s): Itching   Clarithromycin Diarrhea    Abdominal pain   Duloxetine  Hcl Other (See Comments)    drowsiness   Hydrocodone Itching   Lactose Intolerance (Gi) Other (See Comments)    Upset stomach, gas/bloating   Liraglutide Other (See Comments)    Abdominal discomfort   Tramadol  Hcl Itching

## 2025-01-01 NOTE — Progress Notes (Signed)
 "  NAME:  Karen Warner, MRN:  [id number], DOB:  October 21, 1951, LOS: 17 ADMISSION DATE:  12/14/2024, CONSULTATION DATE:  1/23 REFERRING MD:  Lucas, CHIEF COMPLAINT:  post-LVAD   History of Present Illness:  Karen Warner is a 74 y/o woman with a history of ICM and HFrEF who presented on 1/14 with chest pain and decompensated heart failure. She underwent left and right heart catheterization this admission showing low CO and low filling pressures.  She was managed with inotropic support. Due to progression of disease and poor tolerance of GDMT, she was evaluated for LVAD.  Intraoperatively her course was uncomplicated other than low flows on LVAD when her BP went up at the end of the case when she was stimulated. She transferred to the ICU on epi 2, NE 1, milrinone  0.38mcg. TEE with normal RV function, evidence of intrapulmonary shunt.  Paralytics not reversed in ICU. Bypass time , crossclamp time 29 min, cell saver 225cc. LVAD speed 4600 RPM. Alarms on LVAD went off during periods of hypertension post-op, controlled by afterload reduction.   Pertinent  Medical History  HFrEF due to ICM DM2 IDA FM CKD3  Significant Hospital Events: Including procedures, antibiotic start and stop dates in addition to other pertinent events   1/14 admission 1/15 L&R heart cath> nonobstructive disease, low filling pressures 1/23 LVAD 1/24 extubated; fluid boluses & milrinone  increased due to frequent LVAD alarms with high PI 1/25 reintubated for respiratory distress, worsening lactic acidosis 1/26 Tmax 100.4 on vanc and zosyn , remains intubated  1/27 extubated, remains with labile blood pressure, worsening fever/tachycardia and hypoxia requiring BiPap and resumption of inhaled nitric, cultured and broadened to vanc and meropenem , some low flows associated with hypertension. Steroids added for fevers 1/28 stopped cleviprex > improved respiratory status (pulmonary shunting) 1/29 Eating jello. Pain is  better today since she could start oral pain meds. SOB is stable.  2/1 Able to walk in the hall way  Interim History / Subjective:  Patient able to eat better.  She is not on tube feeds anymore.  She still has some diarrhea.  Abdominal discomfort is much better since tube feeds have stopped.  She is off Cleviprex   Objective    Blood pressure (!) 85/55, pulse (!) 108, temperature 98.4 F (36.9 C), resp. rate (!) 37, height 5' 5 (1.651 m), weight 79.4 kg, SpO2 99%. CVP:  [0 mmHg-15 mmHg] 0 mmHg  FiO2 (%):  [50 %] 50 % PEEP:  [5 cmH20] 5 cmH20 Pressure Support:  [8 cmH20] 8 cmH20   Intake/Output Summary (Last 24 hours) at 01/01/2025 1426 Last data filed at 01/01/2025 1100 Gross per 24 hour  Intake 1515.61 ml  Output 2345 ml  Net -829.39 ml   Filed Weights   12/29/24 0530 12/30/24 0600 01/01/25 0450  Weight: 78.6 kg 78.8 kg 79.4 kg   Examination: Physical exam: General: Chronically ill-appearing female, lying on the bed HEENT: [place]/AT, eyes anicteric.  moist mucus membranes Neuro: Alert, awake following commands Chest: Coarse breath sounds, no wheezes or rhonchi Heart: Regular rate and rhythm, no murmurs or gallops LVAD hum is present. Abdomen: Soft, nontender, nondistended, bowel sounds present   Resolved problem list   Assessment and Plan   Acute on chronic HFrEF due to ICM; NYHA class IV heart failure s/p LVAD on 1/23 Plan Off diuretics Continuing low-dose epinephrine  for RV support, currently off nitroglycerin  Milrinone  continued at 0.125 mcg/kg/min. Continue sildenafil  Continuing Coumadin .  Off heparin .  INR 2.1. Continue to trend Co. oximetry  today 58.2 Need to mobilize as tolerated Avoid additional calcium  channel blockers Chest tube management per surgical Team Postoperative analgesia, trying to cut back on this now  Acute respiratory failure with hypoxia;  Likely secondary to pulm edema improving. Plan Continuing diuresis Continued schedule antibiotics for  possible pulmonary infection  Continue scheduled bronchodilators with Brovana  and Yupelri  Avoiding calcium  channel blocker, there is concern about this causing shunt effect hypoxia, although now this seems less likely with the pulmonary edema  Sinus tachycardia, even at rates of 130-160 appeared to have P waves on tele, ECG difficult to interpret given VAD artifact; likely related to fever & post-op status.  Plan Amiodarone  per heart failure  Fevers, leukocytosis.  Tmax 99.1, white blood cells still elevated Stop steroids yesterday Plan Continue to trend fever and white blood cell curve Broad-spectrum antibiotics for 7 days, currently on meropenem  and vancomycin  with stop date placed avoiding precedex ; previously d/c several meds that could have contributed to fever  Hyperglycemia; h/o DM. A1c 6.6 Off IV insulin  again Plan On basal bolus insulin  Steroids stopped  Resistant sliding scale  Anticipated acute blood loss anemia Plan monitor; transfuse for Hb <7 or hemodynamically significant bleeding  GERD Plan  PPI daily  H/o migraine Plan  stopped PTA ubrelvy    H/o FM Plan  holding PTA trintellix  due to fever concern mobilize, optimize sleep  CKD 3b Plan  Renal dose meds  Avoid nephrotoxins  Strict intake output  A.m. chemistry   At risk for malnutrition  Plan advance diet as tolerated  Deconditioning -may need PT & OT   Tamela Stakes, MD  Attending Physician, Critical Care Medicine [facility] See Amion for pager For contact information, see Amion. If no response to pager, please call PCCM 2H APP. After hours, 7PM- 7AM, please call on call APP for 2H.       "

## 2025-01-02 ENCOUNTER — Inpatient Hospital Stay (HOSPITAL_COMMUNITY)

## 2025-01-02 DIAGNOSIS — E46 Unspecified protein-calorie malnutrition: Secondary | ICD-10-CM

## 2025-01-02 DIAGNOSIS — J9601 Acute respiratory failure with hypoxia: Secondary | ICD-10-CM | POA: Diagnosis not present

## 2025-01-02 DIAGNOSIS — I5023 Acute on chronic systolic (congestive) heart failure: Secondary | ICD-10-CM | POA: Diagnosis not present

## 2025-01-02 DIAGNOSIS — E119 Type 2 diabetes mellitus without complications: Secondary | ICD-10-CM | POA: Diagnosis not present

## 2025-01-02 DIAGNOSIS — I255 Ischemic cardiomyopathy: Secondary | ICD-10-CM | POA: Diagnosis not present

## 2025-01-02 DIAGNOSIS — D62 Acute posthemorrhagic anemia: Secondary | ICD-10-CM | POA: Diagnosis not present

## 2025-01-02 LAB — GLUCOSE, CAPILLARY
Glucose-Capillary: 187 mg/dL — ABNORMAL HIGH (ref 70–99)
Glucose-Capillary: 172 mg/dL — ABNORMAL HIGH (ref 70–99)
Glucose-Capillary: 304 mg/dL — ABNORMAL HIGH (ref 70–99)
Glucose-Capillary: 157 mg/dL — ABNORMAL HIGH (ref 70–99)
Glucose-Capillary: 216 mg/dL — ABNORMAL HIGH (ref 70–99)
Glucose-Capillary: 235 mg/dL — ABNORMAL HIGH (ref 70–99)

## 2025-01-02 LAB — CBC
HCT: 30.2 % — ABNORMAL LOW (ref 36.0–46.0)
Hemoglobin: 9.6 g/dL — ABNORMAL LOW (ref 12.0–15.0)
MCH: 29.3 pg (ref 26.0–34.0)
MCHC: 31.8 g/dL (ref 30.0–36.0)
MCV: 92.1 fL (ref 80.0–100.0)
Platelets: 578 10*3/uL — ABNORMAL HIGH (ref 150–400)
RBC: 3.28 MIL/uL — ABNORMAL LOW (ref 3.87–5.11)
RDW: 16.3 % — ABNORMAL HIGH (ref 11.5–15.5)
WBC: 21.3 10*3/uL — ABNORMAL HIGH (ref 4.0–10.5)
nRBC: 0.6 % — ABNORMAL HIGH (ref 0.0–0.2)

## 2025-01-02 LAB — COOXEMETRY PANEL
Carboxyhemoglobin: 1.4 % (ref 0.5–1.5)
Carboxyhemoglobin: 2.4 % — ABNORMAL HIGH (ref 0.5–1.5)
Methemoglobin: <0.7 % (ref 0.0–1.5)
Methemoglobin: <0.7 % (ref 0.0–1.5)
O2 Saturation: 50.2 %
O2 Saturation: 54.5 %
Total hemoglobin: 10 g/dL — ABNORMAL LOW (ref 12.0–16.0)
Total hemoglobin: 10.7 g/dL — ABNORMAL LOW (ref 12.0–16.0)

## 2025-01-02 LAB — CULTURE, BLOOD (ROUTINE X 2): Culture: NO GROWTH

## 2025-01-02 LAB — RENAL FUNCTION PANEL
Albumin: 3 g/dL — ABNORMAL LOW (ref 3.5–5.0)
Anion gap: 18 — ABNORMAL HIGH (ref 5–15)
BUN: 45 mg/dL — ABNORMAL HIGH (ref 8–23)
CO2: 22 mmol/L (ref 22–32)
Calcium: 9.2 mg/dL (ref 8.9–10.3)
Chloride: 93 mmol/L — ABNORMAL LOW (ref 98–111)
Creatinine, Ser: 1.42 mg/dL — ABNORMAL HIGH (ref 0.44–1.00)
GFR, Estimated: 39 mL/min — ABNORMAL LOW
Glucose, Bld: 118 mg/dL — ABNORMAL HIGH (ref 70–99)
Phosphorus: 3.7 mg/dL (ref 2.5–4.6)
Potassium: 4.2 mmol/L (ref 3.5–5.1)
Sodium: 132 mmol/L — ABNORMAL LOW (ref 135–145)

## 2025-01-02 LAB — PROTIME-INR
INR: 2.7 — ABNORMAL HIGH (ref 0.8–1.2)
Prothrombin Time: 30 s — ABNORMAL HIGH (ref 11.4–15.2)

## 2025-01-02 LAB — LACTATE DEHYDROGENASE: LDH: 529 U/L — ABNORMAL HIGH (ref 105–235)

## 2025-01-02 LAB — MAGNESIUM: Magnesium: 2.2 mg/dL (ref 1.7–2.4)

## 2025-01-02 LAB — LACTIC ACID, PLASMA: Lactic Acid, Venous: 1.2 mmol/L (ref 0.5–1.9)

## 2025-01-02 LAB — OCCULT BLOOD X 1 CARD TO LAB, STOOL: Fecal Occult Bld: POSITIVE — AB

## 2025-01-02 MED ORDER — LACTATED RINGERS IV BOLUS
250.0000 mL | Freq: Once | INTRAVENOUS | Status: AC
  Administered 2025-01-02: 250 mL via INTRAVENOUS

## 2025-01-02 MED ORDER — WARFARIN SODIUM 1 MG PO TABS
1.5000 mg | ORAL_TABLET | Freq: Once | ORAL | Status: AC
  Administered 2025-01-02: 1.5 mg via ORAL
  Filled 2025-01-02: qty 1

## 2025-01-02 MED ORDER — BOOST PLUS PO LIQD
237.0000 mL | Freq: Three times a day (TID) | ORAL | Status: AC
  Administered 2025-01-02 – 2025-01-06 (×2): 237 mL via ORAL
  Filled 2025-01-02 (×15): qty 237

## 2025-01-02 MED ORDER — SIMETHICONE 80 MG PO CHEW
80.0000 mg | CHEWABLE_TABLET | Freq: Four times a day (QID) | ORAL | Status: AC | PRN
  Administered 2025-01-02 – 2025-01-06 (×8): 80 mg via ORAL
  Filled 2025-01-02 (×7): qty 1

## 2025-01-02 MED ORDER — CALCIUM CARBONATE ANTACID 500 MG PO CHEW
1.0000 | CHEWABLE_TABLET | Freq: Once | ORAL | Status: AC
  Administered 2025-01-02: 200 mg via ORAL
  Filled 2025-01-02: qty 1

## 2025-01-02 MED ORDER — CALCIUM CARBONATE ANTACID 500 MG PO CHEW: 1.0000 | CHEWABLE_TABLET | Freq: Every day | ORAL | Status: DC

## 2025-01-02 MED ORDER — PANTOPRAZOLE SODIUM 40 MG IV SOLR
40.0000 mg | Freq: Two times a day (BID) | INTRAVENOUS | Status: DC
  Administered 2025-01-02 – 2025-01-05 (×7): 40 mg via INTRAVENOUS
  Filled 2025-01-02 (×7): qty 10

## 2025-01-02 MED ORDER — CALCIUM CARBONATE ANTACID 500 MG PO CHEW
1.0000 | CHEWABLE_TABLET | Freq: Two times a day (BID) | ORAL | Status: AC | PRN
  Administered 2025-01-02 – 2025-01-04 (×4): 200 mg via ORAL
  Filled 2025-01-02 (×4): qty 1

## 2025-01-02 NOTE — Progress Notes (Addendum)
 Nutrition Follow-up  DOCUMENTATION CODES:   Not applicable  INTERVENTION:   Liberalize diet to GI Soft (easy to digest) -Encourage small frequent meals -Encourage protein intake; discussed good sources of protein  Add Boost Plus po TID, each supplement provides 360 kcal and 14 grams of protein   NUTRITION DIAGNOSIS:   Increased nutrient needs related to chronic illness as evidenced by estimated needs.  Continues but being addressed  GOAL:   Patient will meet greater than or equal to 90% of their needs  Progressing  MONITOR:   Diet advancement, PO intake, TF tolerance, Labs, Weight trends, I & O's  REASON FOR ASSESSMENT:   Consult LVAD Eval  ASSESSMENT:   74 y.o. female present to the ED with chest pain. PMH includes GERD, CHF, T2DM, HLD, CAD, COPD, HTN, Glaucoma, diverticulitis, s/p pacemaker, [place] stimulator, fibromyalgia, and CKD III. Pt admitted with chest pain, acute on chronic HF, and AKI on CKD.  1/15 Admitted  1/23 HM3 LVAD implantation 1/24 Extubated, Cleviprex  started 1/25 Re-Intubated, pressors, trickle TF started 1/26 Remains on trickle TF 1/27 Extubated, later on BiPAP, iNO restarted 1/28 Cortrak placed, trickle TF initiated 1/29 TF increased to 30 ml/hr 1/30 TF increased to 40 ml/hr 2/02 Cortrak removed, abd xray with mild diffuse gaseous distention of colon (no ileus or obstruction)  HM3 LVAD on 1/23 Epinephrine  at 0.5, remains on milrinone  and amiodarone  gtt  TF off, Cortrak removed today  Noted pt receiving a fluid bolus, soft pressure with CVP on low side, lasix  gtt off  Pt does not feel well currently, tired and has stomach pain.   Pt ate some oatmeal for breakfast and little fruit for lunch with bites of rice. Ordered fish but too dry for pt to eat. Pt did drink Glucerna shake that was provided by RN; plan to order Boost/Ensure which has double the amount of protein  No recorded po intake yesterday but noted pt ate 0% lunch and  dinner on 1/31  Multiple small black stools today, noted checking hemoccult  CBGs extremely variable, 98-304 but partly due to inconsistent oral intake.   Labs: CBGs 98-304 Sodium 132 (L) Potassium 4.3 (wdl) Phosphorus 3.7 (wdl) Magnesium  2.2 (wdl)   Meds: SS novolog  Colace Tums prn Glargine Miralax  Vitamin D  Coumadin  Protonix   Diet Order:   Diet Order             Diet heart healthy/carb modified Room service appropriate? Yes with Assist; Fluid consistency: Thin  Diet effective now                   EDUCATION NEEDS:   Education needs have been addressed  Skin:  Skin Assessment: Skin Integrity Issues: Skin Integrity Issues:: Incisions Incisions: Driveline- new LVAD on 12/23/24  Last BM:  1/29  Height:   Ht Readings from Last 1 Encounters:  01/02/25 5' 5 (1.651 m)    Weight:   Wt Readings from Last 1 Encounters:  01/02/25 72.5 kg    Ideal Body Weight:  56.8 kg  BMI:  Body mass index is 26.6 kg/m.  Estimated Nutritional Needs:   Kcal:  1800-2000  Protein:  90-115 g  Fluid:  1.8 L   Betsey Finger MS, RDN, LDN, CNSC Registered Dietitian 3 Clinical Nutrition RD Inpatient Contact Info in Amion

## 2025-01-02 NOTE — Progress Notes (Signed)
 LVAD Coordinator Rounding Note:  Admitted 12/14/24 due to CHF exacerbation. Underwent LVAD evaluation.   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  1/23: S/P HMIII Implant.Intra-op speed placed at 4600 due to low filling pressures.   1/24: Extubated. Had some low flows. Cleveprex started. 1/25: Re-intubated with desaturation, increased work of breathing 1/26: Ramp echo, speed increased to 5100 rpm.  1 unit PRBCs. 1/27: Extubated. Increased work of breathing, back on Bipap 1/28: Stopped Clevidipine   Pt is lying bed this afternoon on my rounds. She is c/o of lower abdominal pain today.   KUB performed: Mild diffuse gaseous distention of the colon, unchanged from prior abdominal radiograph from 12/31/2024. No small bowel dilatation to indicate ileus or obstruction.  Pts daughter Jon performed dressing change today. See below for full documentation.  Vital signs: Temp: 97.7 HR: 119 Doppler Pressure: 84 Arterial BP: 99/79 (86) O2 Sat: 95% on RA Wt: 183.6>173.7>171.5>173.7>159.8 lbs    LVAD interrogation reveals:  Speed: 5200 Flow: 3.8 Power: 3.5 w PI: 5.3  Alarms: mult LF yesterday - nurse states this occurred when they got the pt up the chair Events: 6 today  Hematocrit: 28 Fixed speed: 5200 Low speed limit: 4900   Drive Line: Existing VAD dressing removed and site care performed using sterile technique by daughter Jon with VAD coordinator assisting. Drive line exit site cleaned with Chlora prep applicators x 2, allowed to dry, and Silverlon patch with gauze dressing applied. Exit site unincorporated, the velour is fully implanted at exit site. Small amount of sanguinous drainage noted on previous dressing. No redness, tenderness, foul odor or rash noted. Drive line anchor re-applied. Advance to MWF dressing changes by VAD coordinator or nurse champion. Next dressing change due 01/04/25.        Labs:  LDH trend:  464>529>535>539>612>672>640>529  INR trend: 1.3>1.5>1.6>1.3>1.4>1.6>2>2.7  WBC trend: 18.5>23.6>21.0>15.2>17.4>15.6>19>21.3  Hgb trend: 9.2>8.5>7.6>8.9>9.5>8.7>8.1>9.6  Anticoagulation Plan: - INR Goal: 2.0 - 2.5 - ASA Dose: none - Heparin  gtt per pharmacy - Coumadin  dosing per pharmacy  Blood Products:  - Intra-op 1/23:   2 PRBC  4 FFP  225 cc cell saver - Post op:  1/26: 1 PRBC  Device: - [employer] -Therapies: OFF  Arrythmias:   Respiratory: Extubated 1/24. Reintubated 1/25 due to respiratory distress. Extubated 1/27.   Infection:   Renal:  -BUN/CRT: 10/1.3>11/1.15>23/1.34>19/1.03>15/1>22/1.22>34/1.28>45/1.42  Adverse Events on VAD:  Drips:  Epinephrine  0.5 mcg/min Milrinone  0.375 mcg/kg/min Heparin  500 units/hr-off Amiodarone  30mg /hr Lasix  15 mg/hr-off  Patient Education:  Pt's daughter Jon performed dressing change today at bedside with VAD coordinator assisting  Plan/Recommendations:  1. Page VAD coordinator with any drive line or equipment concerns 2. Daily drive line dressing changes per VAD coordinator or nurse champion  Lauraine Ip RN, BSN VAD Coordinator 24/7 Pager [phone]

## 2025-01-02 NOTE — Progress Notes (Signed)
 Seen on afternoon rounds.  Able to take a few bites of food and some sips of liquids.   MAP consistently low 60s and doppler 64.   More fatigued this afternoon.  Give back 250 cc LR. May need to hold sildenafil  if pressures remain soft.  RN notes dark stools. Check hemoccult.  Manuelita Dutch, PA-C Advanced Heart Failure

## 2025-01-02 NOTE — Progress Notes (Signed)
 PHARMACY - ANTICOAGULATION CONSULT NOTE  Pharmacy Consult for heparin  bridge to warfarin  Indication: LVAD  Allergies[1]  Patient Measurements: Height: 5' 5 (165.1 cm) Weight: 72.5 kg (159 lb 13.3 oz) IBW/kg (Calculated) : 57 HEPARIN  DW (KG): 71.6  Vital Signs: Temp: 97.7 F (36.5 C) (02/02 0800) Temp Source: Oral (02/02 0800) BP: 99/79 (02/02 0900) Pulse Rate: 107 (02/02 0817)  Labs: Recent Labs    12/31/24 0512 12/31/24 1620 01/01/25 0427 01/02/25 0158 01/02/25 0359  HGB 9.2*  --   --   --  9.6*  HCT 28.5*  --   --   --  30.2*  PLT 439*  --   --   --  578*  LABPROT 24.8*  --  24.3* 30.0*  --   INR 2.1*  --  2.1* 2.7*  --   CREATININE 1.25* 1.32* 1.38* 1.42*  --     Estimated Creatinine Clearance: 34.7 mL/min (A) (by C-G formula based on SCr of 1.42 mg/dL (H)).   Medical History: Past Medical History:  Diagnosis Date   AICD (automatic cardioverter/defibrillator) present    Anemia 12/12/2019   Anginal pain    Arthralgia 01/14/2020   Arthritis    Asthma    Atherosclerotic heart disease of native coronary artery without angina pectoris 04/13/2014   CHF (congestive heart failure) (HCC)    Chronic bilateral low back pain with bilateral sciatica 01/13/2019   Chronic bladder pain 11/08/2014   Last Assessment & Plan:  Formatting of this note might be different from the original. Patient has chronic pain syndrome in general I think this is unfortunately worsening her pelvic pain and bladder pain her examination today was very reassuring I am advised her to use the estrogen cream I did start her on Elavil 10 milligrams at that time if she does tolerated will increase it to 25 milligrams.    Chronic headaches    Chronic idiopathic constipation 12/22/2014   Formatting of this note might be different from the original. Last Assessment & Plan:  For better bowel emptying please use either Citrucel / Benefiber start with 2 tablespoons daily and titrate up or down to effect.   Also please use glycerin  suppositories as needed to assist in evacuation. Last Assessment & Plan:  Formatting of this note might be different from the original. For better bowel empt   Chronic obstructive pulmonary disease, unspecified (HCC) 03/18/2018   CKD (chronic kidney disease) stage 3, GFR 30-59 ml/min (HCC)    Class 1 obesity due to excess calories with serious comorbidity and body mass index (BMI) of 31.0 to 31.9 in adult 06/26/2020   Colon polyps    COPD (chronic obstructive pulmonary disease) (HCC)    Coronary artery disease    DDD (degenerative disc disease), cervical 01/13/2019   Depression 11/05/2019   Diabetic polyneuropathy associated with type 2 diabetes mellitus (HCC) 02/24/2019   Diverticulitis 05/20/2018   Diverticulitis of colon 03/18/2018   Family history of ischemic heart disease (IHD) 08/16/2013   Fibromyalgia    Fibromyalgia affecting multiple sites 01/14/2020   Gastro-esophageal reflux disease without esophagitis 01/13/2019   Glaucoma 11/08/2014   Hordeolum externum of left upper eyelid 03/13/2020   Hypertension 11/08/2014   IBS (irritable bowel syndrome)    Iron deficiency anemia 01/13/2019   Left renal mass 11/08/2019   Leukocytosis 05/28/2018   Low vitamin B12 level 03/13/2020   Low vitamin D  level 12/12/2019   LV dysfunction 05/31/2019   Malignant essential hypertension 01/22/2016   Migraine, unspecified,  not intractable, without status migrainosus 11/08/2014   Mixed hyperlipidemia 01/13/2019   Myocardial infarction [facility])    Other premature beats 11/08/2014   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 Last Assessment & Plan:  For vaginal atrophy please place a pea size dab of Estrogen vaginal cream  into the vagina 3 times a week ( Monday, Wednesday, Friday) Last Assessment & Plan:  Formatting of this note might be different from the original. For vaginal atrophy please place a pea size dab of Estrogen vaginal cream  into the vagina     Medications:  Scheduled:   acetaminophen  (TYLENOL ) oral liquid 160 mg/5 mL  650 mg Per Tube Q6H   arformoterol   15 mcg Nebulization BID   Chlorhexidine  Gluconate Cloth  6 each Topical Daily   docusate  100 mg Per Tube Daily   feeding supplement (PIVOT 1.5 CAL)  1,000 mL Per Tube Q24H   gabapentin   200 mg Per Tube QHS   insulin  aspart  0-24 Units Subcutaneous Q4H   insulin  glargine-yfgn  24 Units Subcutaneous Daily   lidocaine   2 patch Transdermal Q24H   mouth rinse  15 mL Mouth Rinse 4 times per day   pantoprazole  (PROTONIX ) IV  40 mg Intravenous Q24H   polyethylene glycol  17 g Per Tube Daily   revefenacin   175 mcg Nebulization Daily   sildenafil   20 mg Per Tube TID   sodium chloride  flush  10-40 mL Intracatheter Q12H   sodium chloride  flush  3 mL Intravenous Q12H   Vitamin D  (Ergocalciferol )  50,000 Units Oral Q Sun   Warfarin - Pharmacist Dosing Inpatient   Does not apply q1600    Assessment: Patient is a 74 year old female with new LVAD placement 1/23. She was not on anticoagulation PTA. Pharmacy is consulted to dose warfarin for LVAD.   INR slightly above goal at 2.7, CBC ok, LDH stable. TFs stopped.  Goal of Therapy:  INR 2-2.5   Monitor platelets by anticoagulation protocol: Yes   Plan:  Warfarin 1.5mg  x1 tonight Daily INR  Karen Warner, PharmD, BCPS, [facility] Clinical Pharmacist [phone] Please check AMION for all [facility] Pharmacy numbers 01/02/2025             [1]  Allergies Allergen Reactions   Bee Venom Anaphylaxis   Sumatriptan Shortness Of Breath    Migraine worsened   Amoxicillin-Pot Clavulanate  Diarrhea and Nausea And Vomiting    GI Intolerance   Oxycodone  Itching and Hives    Other reaction(s): Other (see comments) Funny feeling in head  off the edge    Buprenorphine Hcl     Other reaction(s): Itching   Clarithromycin Diarrhea    Abdominal pain   Duloxetine  Hcl Other (See Comments)    drowsiness   Hydrocodone Itching   Lactose Intolerance (Gi) Other (See Comments)    Upset stomach, gas/bloating   Liraglutide Other (See Comments)    Abdominal discomfort   Tramadol Hcl Itching

## 2025-01-02 NOTE — Progress Notes (Signed)
 HeartMate 3 Rounding Note  Subjective:    Her main complaint is of lower abdominal discomfort. Bowels working but loose. Appetite better since off tube feeds.  Milrinone  0.125 and epi 0.5.   Co-ox 50.2 and repeat 54.5  LVAD INTERROGATION:  HeartMate IIl LVAD:  Flow 3.7 liters/min, speed 5200, power 4, PI 6.1.    Objective:    Vital Signs:   Temp:  [97.7 F (36.5 C)-98.6 F (37 C)] 97.7 F (36.5 C) (02/02 0800) Pulse Rate:  [96-194] 107 (02/02 0817) Resp:  [19-37] 28 (02/02 0900) BP: (51-125)/(40-81) 99/79 (02/02 0900) SpO2:  [62 %-100 %] 95 % (02/02 0817) FiO2 (%):  [50 %] 50 % (02/01 2320) Weight:  [72.5 kg] 72.5 kg (02/02 0500) Last BM Date : 01/02/25 Mean arterial Pressure 70-80  Intake/Output:   Intake/Output Summary (Last 24 hours) at 01/02/2025 1016 Last data filed at 01/02/2025 0800 Gross per 24 hour  Intake 851.86 ml  Output 2200 ml  Net -1348.14 ml     Physical Exam: General:  Well appearing. No resp difficulty HEENT: normal Neck: Normal Cor: Distant heart sounds with LVAD hum present. Lungs: clear Abdomen: soft, nontender, nondistended. Good bowel sounds. Extremities: no edema Neuro: alert & orientedx3, cranial nerves grossly intact. moves all 4 extremities w/o difficulty. Affect pleasant  Telemetry: internally paced.  Labs: Basic Metabolic Panel: Recent Labs  Lab 12/28/24 1547 12/28/24 1555 12/29/24 0329 12/29/24 0546 12/30/24 0348 12/30/24 0818 12/30/24 1400 12/31/24 0512 12/31/24 1620 01/01/25 0427 01/02/25 0158  NA 140  140   < > 138   < > 142   < > 136 135 135 134* 132*  K 3.7  3.7   < > 4.3   < > 2.6*   < > 5.1 3.1* 4.8 3.6 4.2  CL 104  104  --  104  --  100   < > 98 89* 93* 89* 93*  CO2 22  22  --  23  --  28   < > 26 32 31 28 22   GLUCOSE 208*  208*  --  254*  --  150*   < > 471* 424* 206* 237* 118*  BUN 16  16  --  22  --  32*   < > 36* 42* 47* 47* 45*  CREATININE 1.09*  1.05*  --  1.22*  --  1.25*   < > 1.30* 1.25* 1.32*  1.38* 1.42*  CALCIUM  8.6*  8.6*  --  8.5*  --  8.8*   < > 9.2 9.3 9.6 9.0 9.2  MG 2.2  --  2.3  --  2.1  --   --   --   --  2.0 2.2  PHOS 2.4*  --  2.2*  --  3.2  --   --  2.6  --  3.8 3.7   < > = values in this interval not displayed.    Liver Function Tests: Recent Labs  Lab 12/28/24 0810 12/28/24 1547 12/30/24 0348 12/31/24 0512 01/01/25 0427 01/02/25 0158  AST 39  --   --   --   --   --   ALT 6  --   --   --   --   --   ALKPHOS 98  --   --   --   --   --   BILITOT 0.5  --   --   --   --   --   PROT 6.6  --   --   --   --   --  ALBUMIN  3.6 3.5 3.4* 3.3* 3.4* 3.0*   No results for input(s): LIPASE, AMYLASE in the last 168 hours. No results for input(s): AMMONIA in the last 168 hours.  CBC: Recent Labs  Lab 12/27/24 0519 12/27/24 9357 12/28/24 0435 12/28/24 0534 12/29/24 0329 12/29/24 0546 12/30/24 0348 12/31/24 0512 01/02/25 0359  WBC 15.2*  --  17.4*  --  15.6*  --  19.0* 17.6* 21.3*  NEUTROABS 11.2*  --  13.9*  --  11.9*  --  14.1*  --   --   HGB 8.9*   < > 9.5*   < > 8.7* 9.2* 9.1* 9.2* 9.6*  HCT 27.5*   < > 28.5*   < > 26.5* 27.0* 28.0* 28.5* 30.2*  MCV 92.9  --  90.2  --  91.1  --  92.1 91.1 92.1  PLT 210  --  248  --  308  --  378 439* 578*   < > = values in this interval not displayed.    INR: Recent Labs  Lab 12/29/24 0329 12/30/24 0348 12/31/24 0512 01/01/25 0427 01/02/25 0158  INR 1.6* 2.0* 2.1* 2.1* 2.7*    Other results: EKG:   Imaging: DG Chest Port 1 View Result Date: 01/02/2025 CLINICAL DATA:  Left ventricular assist device. EXAM: PORTABLE CHEST 1 VIEW COMPARISON:  01/01/2025 FINDINGS: The cardio pericardial silhouette is enlarged. No overt edema. Left base largely obscured. Lungs otherwise appear clear with interval decrease in right base atelectasis seen previously. Left ventricular assist device again noted with left-sided permanent pacemaker/AICD and battery pack for stimulator device over the right chest. Right PICC line tip  overlies the SVC/RA junction. IMPRESSION: 1. Interval decrease in right base atelectasis. 2. Opacification of the left base previously not well demonstrated today given superimposition of support devices. Electronically Signed   By: Camellia Candle M.D.   On: 01/02/2025 08:02   DG CHEST PORT 1 VIEW Result Date: 01/01/2025 CLINICAL DATA:  CHF. EXAM: PORTABLE CHEST 1 VIEW COMPARISON:  12/31/2024 FINDINGS: Left-sided pacemaker unchanged. LVAD unchanged. Enteric tube courses into the region of the stomach and off the image as tip is not visualized. Right-sided PICC line unchanged. Right chest wall stimulator device with lead extending up into the right neck unchanged. Lungs are hypoinflated with opacification over the left base/retrocardiac region without significant change possibly atelectasis with effusion. Improved aeration over the left mid lung/perihilar region. Minimal linear atelectasis right base. Mild stable cardiomegaly. Remainder of the exam is unchanged. IMPRESSION: 1. Hypoinflation with persistent opacification over the left base/retrocardiac region possibly atelectasis with effusion. Improved aeration over the left mid lung/perihilar region. Minimal linear atelectasis right base. 2. Tubes and lines as described. Electronically Signed   By: Toribio Agreste M.D.   On: 01/01/2025 10:40   DG Abd 1 View Result Date: 12/31/2024 CLINICAL DATA:  Abdominal pain. EXAM: ABDOMEN - 1 VIEW COMPARISON:  12/28/2024. FINDINGS: There is a nonobstructive bowel-gas pattern. An enteric tube terminates in the distal stomach near the anticipated region of the gastric antrum. Surgical clips are seen in the mid left abdomen and left pelvis. There is partial visualization of LVAD device. IMPRESSION: 1. Nonobstructive bowel-gas pattern. 2. Enteric tube terminates in the distal stomach in the region of the gastric antrum. Electronically Signed   By: Leita Birmingham M.D.   On: 12/31/2024 15:25     Medications:     Scheduled  Medications:  acetaminophen  (TYLENOL ) oral liquid 160 mg/5 mL  650 mg Per Tube Q6H   arformoterol   15 mcg Nebulization BID   Chlorhexidine  Gluconate Cloth  6 each Topical Daily   docusate  100 mg Per Tube Daily   feeding supplement (PIVOT 1.5 CAL)  1,000 mL Per Tube Q24H   gabapentin   200 mg Per Tube QHS   insulin  aspart  0-24 Units Subcutaneous Q4H   insulin  glargine-yfgn  24 Units Subcutaneous Daily   lidocaine   2 patch Transdermal Q24H   mouth rinse  15 mL Mouth Rinse 4 times per day   pantoprazole  (PROTONIX ) IV  40 mg Intravenous Q24H   polyethylene glycol  17 g Per Tube Daily   revefenacin   175 mcg Nebulization Daily   sildenafil   20 mg Per Tube TID   sodium chloride  flush  10-40 mL Intracatheter Q12H   sodium chloride  flush  3 mL Intravenous Q12H   Vitamin D  (Ergocalciferol )  50,000 Units Oral Q Sun   Warfarin - Pharmacist Dosing Inpatient   Does not apply q1600    Infusions:  amiodarone  30 mg/hr (01/02/25 0700)   epinephrine  0.5 mcg/min (01/02/25 0700)   milrinone  0.125 mcg/kg/min (01/02/25 0700)    PRN Medications: dextrose , fentaNYL  (SUBLIMAZE ) injection, Gerhardt's butt cream, hydrALAZINE , LORazepam , ondansetron  (ZOFRAN ) IV, mouth rinse, oxyCODONE , sodium chloride  flush, sodium chloride  flush   Assessment:   POD 10 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 Normal RV. Preop LVEF 20-25%. She has CRT-D  device and Barostim.   CAD with stable coronary anatomy per recent cath.   COPD with previous smoking until 2021.   Stage 3 CKD with creat around 1.5-1.6 preop.   Preop RUQ and epigastric abdominal pain with no evidence of acute cholecystitis on US  . Seen by GI. Felt to possibly be related to low CO. Continues to complain of lower abdominal discomfort. She has hx of diverticulosis/diverticulitis and had left hemi-colectomy before.   Obesity.    Acute postop respiratory failure requiring reintubation over the weekend. Appeared to be  developing sepsis with fever, leukocytosis, increased lactic acid. Started on vanc and Zosyn  with quick improvement in lactic acid. Temp curve improving with Tmax 99.1 yesterday.  On Merrem  and Vanc. BC neative so far. This could just be SIRS early postop. Has foley and arterial line from OR.  She received some steroids. WBC up a bit since which could be related to that.    Acute postop blood loss anemia improved with transfusion and stable.    Plan/Discussion:    Co-ox marginal this am on current low dose milrinone  and epi. CVP has been low. AHF team will decides about inotropes.   -1554 cc yesterday. Recorded wt is 16 lbs down from yesterday. Don't think that is accurate.   Has completed 7 day course of antibiotics with no clear infection and negative cultures so will stop them. WBC ct remains elevated but could be due to steroids.  Therapeutic on Coumadin .  Continue IS, ambulation.   Remove Cortrack  and encourage po nutrition.   I reviewed the LVAD parameters from today, and compared the results to the patient's prior recorded data.  No programming changes were made.  The LVAD is functioning within specified parameters.   Length of Stay: [address]  Dorise POUR [facility] 01/02/2025, 10:16 AM

## 2025-01-02 NOTE — Progress Notes (Signed)
 TCTS PM Rounding Progress Note  PO intake not great today, Cortrak is out Says she's having abdominal discomfort - no nausea or vomiting, has had multiple BM today, not passing gas KUB showed dilated colon  Vitals:   01/02/25 1630 01/02/25 1700  BP: (!) 78/62   Pulse:    Resp: (!) 31 17  Temp:    SpO2:     Exam: Abdomen is slightly distended, tender but soft, no peritonitis  Plan: - Will try simethicone  for gas pain - Encouraged walking to help mobilize gas  Con Clunes, MD Cardiothoracic Surgery Pager: [phone]

## 2025-01-02 NOTE — Progress Notes (Signed)
 Had sustained run of VT Plan Will f/u K and Mg

## 2025-01-02 NOTE — Progress Notes (Signed)
 Physical Therapy Treatment Patient Details Name: Karen Warner MRN: [id number] DOB: 1951/05/07 Today's Date: 01/02/2025   History of Present Illness 74 yo F presented to hospital with anterior chest pain. She states this pain is similar to her previous CAD episodes. Underwent heart cath 12/15/2024 and now advanced heart failure and LVAD coordinators consulted. S/p HM3 LVAD placement 1/23 and extubated s/p surgery on 1/24. Re-intubated 1/25-1/27 secondary to respiratory failure. PHMx:CAD, COPD (quit smoking 2021). Ischemic CM with EF < 20%, ICD placed. CHF, chronic low back pain, CKD3, DM2, DDD-cervical, fibromyalgia, MI, peripheral neuropathy, PVD, retinopathy and glaucoma    PT Comments  Pt is making good progress towards her mobility goals since LVAD placement, once pt starts moving. Pt however has difficulty with identifying parts of her LVAD, sequencing change from wall power to battery power and manipulating the cords. Pt will require further education and practice with LVAD to become facile with change overs. Pt is modA for bed mobility, min-modAx2 for transfers and ambulation with EVA walker. Pt requiring supplemental O2 to maintain SpO2 >90%O2 with ambulation. D/c plans remain appropriate. PT will continue to follow acutely.     If plan is discharge home, recommend the following: A little help with bathing/dressing/bathroom;Assistance with cooking/housework;Assist for transportation;Help with stairs or ramp for entrance   Can travel by private vehicle      No  Equipment Recommendations  None recommended by PT    Recommendations for Other Services Rehab consult     Precautions / Restrictions Precautions Precautions: Fall;Sternal Recall of Precautions/Restrictions: Intact Precaution/Restrictions Comments: LVAD,  foley Restrictions Other Position/Activity Restrictions: sternal precautions     Mobility  Bed Mobility Overal bed mobility: Needs Assistance Bed Mobility:  Rolling, Sidelying to Sit           General bed mobility comments: coming to EOB with heavy assist from PT on arrival    Transfers Overall transfer level: Needs assistance Equipment used: 1 person hand held assist, 2 person hand held assist (face to face vs with EVA) Transfers: Sit to/from Stand Sit to Stand: Min assist, +2 safety/equipment           General transfer comment: light assist for anterior weight shift    Ambulation/Gait Ambulation/Gait assistance: Min assist, Mod assist, +2 safety/equipment (+ chair follow) Gait Distance (Feet): 25 Feet (+25, =50) Assistive device: Elyn Finder Gait Pattern/deviations: Step-through pattern, Decreased stride length, Trunk flexed Gait velocity: reduced Gait velocity interpretation: <1.8 ft/sec, indicate of risk for recurrent falls   General Gait Details: min-modA for stabilization at the hips with ambulation in EVA walker, pt with increased WoB, and need for supplemental O2, which improved ambulation tolerance for return to room       Balance Overall balance assessment: Needs assistance Sitting-balance support: No upper extremity supported, Feet supported Sitting balance-Leahy Scale: Fair     Standing balance support: No upper extremity supported, During functional activity Standing balance-Leahy Scale: Poor Standing balance comment: forward posture with need for cues with standing                            Communication Communication Communication: No apparent difficulties  Cognition Arousal: Alert Behavior During Therapy: Flat affect, WFL for tasks assessed/performed   PT - Cognitive impairments: Memory, Attention, Sequencing, Safety/Judgement, Problem solving                       PT - Cognition Comments: pt with  increased difficulty managing her LVAD switch from wall power to battery power, has difficulty identifying control box, drive line, increased difficulty with manipulation of connections  to change power source. requiring maxA to complete switch over Following commands: Intact      Cueing Cueing Techniques: Verbal cues, Gestural cues     General Comments General comments (skin integrity, edema, etc.): initially on RA but desatting as low as 88 with movement when able to get reading. Donned 3 L supplemental O2      Pertinent Vitals/Pain Pain Assessment Pain Assessment: Faces Faces Pain Scale: Hurts little more Pain Location: abdomen Pain Descriptors / Indicators: Discomfort, Grimacing, Guarding Pain Intervention(s): Limited activity within patient's tolerance, Monitored during session, Repositioned, Other (comment) (N provided Tums on pt request)     PT Goals (current goals can now be found in the care plan section) Acute Rehab PT Goals PT Goal Formulation: With patient Time For Goal Achievement: 01/13/25 Potential to Achieve Goals: Good Progress towards PT goals: Progressing toward goals    Frequency    Min 2X/week           Co-evaluation PT/OT/SLP Co-Evaluation/Treatment: Yes Reason for Co-Treatment: Complexity of the patient's impairments (multi-system involvement);For patient/therapist safety;To address functional/ADL transfers   OT goals addressed during session: ADL's and self-care      AM-PAC PT 6 Clicks Mobility   Outcome Measure  Help needed turning from your back to your side while in a flat bed without using bedrails?: Total Help needed moving from lying on your back to sitting on the side of a flat bed without using bedrails?: Total Help needed moving to and from a bed to a chair (including a wheelchair)?: Total Help needed standing up from a chair using your arms (e.g., wheelchair or bedside chair)?: Total Help needed to walk in hospital room?: Total Help needed climbing 3-5 steps with a railing? : Total 6 Click Score: 6    End of Session   Activity Tolerance: Patient tolerated treatment well;Patient limited by fatigue;Patient  limited by pain Patient left: in bed;with call bell/phone within reach;with bed alarm set;with family/visitor present Nurse Communication: Mobility status PT Visit Diagnosis: Other abnormalities of gait and mobility (R26.89);Muscle weakness (generalized) (M62.81)     Time: 8982-8875 PT Time Calculation (min) (ACUTE ONLY): 67 min  Charges:    $Gait Training: 23-37 mins $Self Care/Home Management: 8-22 PT General Charges $$ ACUTE PT VISIT: 1 Visit                     Celvin Taney B. Fleeta Lapidus PT, DPT Acute Rehabilitation Services Please use secure chat or  Call Office [phone]    [name] KATHEE Fleeta [facility] 01/02/2025, 1:58 PM

## 2025-01-02 NOTE — Progress Notes (Signed)
 H&V Care Navigation CSW Progress Note  Outpatient Heart Failure Clinic met with patient and patient daughter, Jon, at bedside to check in.  Patient alert and oriented but feeling poorly today- has been having an upset stomach which is really bothering her and not having much of an appetite.  Daughter and patient report no needs at this time.  CSW will continue to follow during inpatient hospital stay and assist as needed  Aireana Ryland H. Dan Scearce, LCSW Clinical Social Worker Advanced Heart Failure Clinic Desk#: [phone] Cell#: [phone]

## 2025-01-03 DIAGNOSIS — I130 Hypertensive heart and chronic kidney disease with heart failure and stage 1 through stage 4 chronic kidney disease, or unspecified chronic kidney disease: Secondary | ICD-10-CM | POA: Diagnosis not present

## 2025-01-03 DIAGNOSIS — D649 Anemia, unspecified: Secondary | ICD-10-CM | POA: Diagnosis not present

## 2025-01-03 DIAGNOSIS — N1832 Chronic kidney disease, stage 3b: Secondary | ICD-10-CM | POA: Diagnosis not present

## 2025-01-03 DIAGNOSIS — I255 Ischemic cardiomyopathy: Secondary | ICD-10-CM | POA: Diagnosis not present

## 2025-01-03 DIAGNOSIS — E1122 Type 2 diabetes mellitus with diabetic chronic kidney disease: Secondary | ICD-10-CM | POA: Diagnosis not present

## 2025-01-03 DIAGNOSIS — E1165 Type 2 diabetes mellitus with hyperglycemia: Secondary | ICD-10-CM | POA: Diagnosis not present

## 2025-01-03 DIAGNOSIS — N179 Acute kidney failure, unspecified: Secondary | ICD-10-CM | POA: Diagnosis not present

## 2025-01-03 DIAGNOSIS — Z95811 Presence of heart assist device: Secondary | ICD-10-CM | POA: Diagnosis not present

## 2025-01-03 DIAGNOSIS — I252 Old myocardial infarction: Secondary | ICD-10-CM

## 2025-01-03 DIAGNOSIS — D72829 Elevated white blood cell count, unspecified: Secondary | ICD-10-CM | POA: Diagnosis not present

## 2025-01-03 DIAGNOSIS — J9601 Acute respiratory failure with hypoxia: Secondary | ICD-10-CM | POA: Diagnosis not present

## 2025-01-03 DIAGNOSIS — Z794 Long term (current) use of insulin: Secondary | ICD-10-CM | POA: Diagnosis not present

## 2025-01-03 DIAGNOSIS — I5023 Acute on chronic systolic (congestive) heart failure: Secondary | ICD-10-CM | POA: Diagnosis not present

## 2025-01-03 LAB — BASIC METABOLIC PANEL WITH GFR
Anion gap: 9 (ref 5–15)
BUN: 35 mg/dL — ABNORMAL HIGH (ref 8–23)
CO2: 28 mmol/L (ref 22–32)
Calcium: 9.4 mg/dL (ref 8.9–10.3)
Chloride: 98 mmol/L (ref 98–111)
Creatinine, Ser: 1.25 mg/dL — ABNORMAL HIGH (ref 0.44–1.00)
GFR, Estimated: 45 mL/min — ABNORMAL LOW
Glucose, Bld: 116 mg/dL — ABNORMAL HIGH (ref 70–99)
Potassium: 4.3 mmol/L (ref 3.5–5.1)
Sodium: 135 mmol/L (ref 135–145)

## 2025-01-03 LAB — CBC
HCT: 26.2 % — ABNORMAL LOW (ref 36.0–46.0)
HCT: 28.6 % — ABNORMAL LOW (ref 36.0–46.0)
Hemoglobin: 8.1 g/dL — ABNORMAL LOW (ref 12.0–15.0)
Hemoglobin: 8.9 g/dL — ABNORMAL LOW (ref 12.0–15.0)
MCH: 28.9 pg (ref 26.0–34.0)
MCH: 29.1 pg (ref 26.0–34.0)
MCHC: 30.9 g/dL (ref 30.0–36.0)
MCHC: 31.1 g/dL (ref 30.0–36.0)
MCV: 93.6 fL (ref 80.0–100.0)
MCV: 93.5 fL (ref 80.0–100.0)
Platelets: 540 10*3/uL — ABNORMAL HIGH (ref 150–400)
Platelets: 681 10*3/uL — ABNORMAL HIGH (ref 150–400)
RBC: 2.8 MIL/uL — ABNORMAL LOW (ref 3.87–5.11)
RBC: 3.06 MIL/uL — ABNORMAL LOW (ref 3.87–5.11)
RDW: 16.1 % — ABNORMAL HIGH (ref 11.5–15.5)
RDW: 16.3 % — ABNORMAL HIGH (ref 11.5–15.5)
WBC: 18.8 10*3/uL — ABNORMAL HIGH (ref 4.0–10.5)
WBC: 23.3 10*3/uL — ABNORMAL HIGH (ref 4.0–10.5)
nRBC: 0.4 % — ABNORMAL HIGH (ref 0.0–0.2)
nRBC: 0.5 % — ABNORMAL HIGH (ref 0.0–0.2)

## 2025-01-03 LAB — RENAL FUNCTION PANEL
Albumin: 3.2 g/dL — ABNORMAL LOW (ref 3.5–5.0)
Anion gap: 12 (ref 5–15)
BUN: 23 mg/dL (ref 8–23)
CO2: 24 mmol/L (ref 22–32)
Calcium: 9 mg/dL (ref 8.9–10.3)
Chloride: 105 mmol/L (ref 98–111)
Creatinine, Ser: 1.65 mg/dL — ABNORMAL HIGH (ref 0.44–1.00)
GFR, Estimated: 32 mL/min — ABNORMAL LOW
Glucose, Bld: 105 mg/dL — ABNORMAL HIGH (ref 70–99)
Phosphorus: 3.2 mg/dL (ref 2.5–4.6)
Potassium: 2.8 mmol/L — ABNORMAL LOW (ref 3.5–5.1)
Sodium: 141 mmol/L (ref 135–145)

## 2025-01-03 LAB — GLUCOSE, CAPILLARY
Glucose-Capillary: 93 mg/dL (ref 70–99)
Glucose-Capillary: 242 mg/dL — ABNORMAL HIGH (ref 70–99)
Glucose-Capillary: 121 mg/dL — ABNORMAL HIGH (ref 70–99)
Glucose-Capillary: 82 mg/dL (ref 70–99)
Glucose-Capillary: 165 mg/dL — ABNORMAL HIGH (ref 70–99)

## 2025-01-03 LAB — PROTIME-INR
INR: 3.4 — ABNORMAL HIGH (ref 0.8–1.2)
Prothrombin Time: 36.1 s — ABNORMAL HIGH (ref 11.4–15.2)

## 2025-01-03 LAB — COOXEMETRY PANEL
Carboxyhemoglobin: 2 % — ABNORMAL HIGH (ref 0.5–1.5)
Methemoglobin: <0.7 % (ref 0.0–1.5)
O2 Saturation: 61.6 %
Total hemoglobin: 8.7 g/dL — ABNORMAL LOW (ref 12.0–16.0)

## 2025-01-03 LAB — LACTATE DEHYDROGENASE: LDH: 499 U/L — ABNORMAL HIGH (ref 105–235)

## 2025-01-03 LAB — MAGNESIUM: Magnesium: 2.3 mg/dL (ref 1.7–2.4)

## 2025-01-03 MED ORDER — POTASSIUM CHLORIDE CRYS ER 20 MEQ PO TBCR: 40.0000 meq | EXTENDED_RELEASE_TABLET | ORAL | Status: DC

## 2025-01-03 MED ORDER — POTASSIUM CHLORIDE 20 MEQ PO PACK
40.0000 meq | PACK | ORAL | Status: AC
  Administered 2025-01-03 (×2): 40 meq via ORAL
  Filled 2025-01-03: qty 2

## 2025-01-03 MED ORDER — VORTIOXETINE HBR 5 MG PO TABS
5.0000 mg | ORAL_TABLET | Freq: Every day | ORAL | Status: AC
  Administered 2025-01-03 – 2025-01-06 (×4): 5 mg via ORAL
  Filled 2025-01-03 (×4): qty 1

## 2025-01-03 MED ORDER — ACETAMINOPHEN 325 MG PO TABS
650.0000 mg | ORAL_TABLET | Freq: Four times a day (QID) | ORAL | Status: AC
  Administered 2025-01-03 – 2025-01-06 (×14): 650 mg via ORAL
  Filled 2025-01-03 (×14): qty 2

## 2025-01-03 MED ORDER — POTASSIUM CHLORIDE 10 MEQ/100ML IV SOLN
10.0000 meq | INTRAVENOUS | Status: AC
  Administered 2025-01-03 (×2): 10 meq via INTRAVENOUS
  Filled 2025-01-03 (×2): qty 100

## 2025-01-03 NOTE — TOC Progression Note (Signed)
 Transition of Care [facility]) - Progression Note    Patient Details  Name: Karen Warner MRN: [id number] Date of Birth: 12/31/1950  Transition of [facility]) CM/SW Contact  [name] Phone Number: [phone] 01/03/2025, 3:47 PM  Clinical Narrative:   Per chart review, patient is not medically stable for dc. Patient being screened for CIR, possible candidate. ICM will continue to follow.    HF CSW/CM will continue to follow and monitor for dc readiness.    Expected Discharge Plan: Home/Self Care Barriers to Discharge: Continued Medical Work up               Expected Discharge Plan and Services       Living arrangements for the past 2 months: Single Family Home                                       Social Drivers of Health (SDOH) Interventions SDOH Screenings   Food Insecurity: No Food Insecurity (12/15/2024)  Housing: Low Risk (12/17/2024)  Transportation Needs: No Transportation Needs (12/17/2024)  Utilities: Not At Risk (12/17/2024)  Depression (PHQ2-9): Low Risk (05/26/2024)  Financial Resource Strain: Low Risk (12/07/2024)   Received from [facility]  Social Connections: Moderately Integrated (12/17/2024)  Tobacco Use: Medium Risk (01/01/2025)    Readmission Risk Interventions     No data to display

## 2025-01-03 NOTE — Progress Notes (Signed)
  Inpatient Rehab Admissions Coordinator :  Per therapy recommendations, patient was screened for CIR candidacy by Ottie Glazier RN MSN.  At this time patient appears to be a potential candidate for CIR. I will place a rehab consult per protocol for full assessment. Please call me with any questions.  Ottie Glazier RN MSN Admissions Coordinator [phone]

## 2025-01-03 NOTE — Progress Notes (Signed)
 LVAD Coordinator Rounding Note:  Admitted 12/14/24 due to CHF exacerbation. Underwent LVAD evaluation.   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  1/23: S/P HMIII Implant.Intra-op speed placed at 4600 due to low filling pressures.   1/24: Extubated. Had some low flows. Cleveprex started. 1/25: Re-intubated with desaturation, increased work of breathing 1/26: Ramp echo, speed increased to 5100 rpm.  1 unit PRBCs. 1/27: Extubated. Increased work of breathing, back on Bipap 1/28: Stopped Clevidipine  2/2: MAPs low (60s). Given IVF back. Stopped sildenafil . Ongoing abdominal pain. KUB with dilated colon.   Pt is lying bed this morning. She continues to c/o of lower abdominal pain she tells me it is slightly better today. Nurse reported dark stools yesterday. Pt is hemoccult + INR is 3.4 today. Hgb dropped from 9.6 to 8.1 today. Pt had low maps yesterday afternoon and fit the clinical picture for GIB in VAD pt. Pt is weak today and had a low flow this morning and last night. Spoke with pharmacist who is going to hold her Coumadin  tonight.  KUB performed 01/02/25: Mild diffuse gaseous distention of the colon, unchanged from prior abdominal radiograph from 12/31/2024. No small bowel dilatation to indicate ileus or obstruction.  Pts daughter Jon remains in the room.   Vital signs: Temp: 97.8 HR: 95 Doppler Pressure: 85 Arterial BP: 101/84 (90) O2 Sat: 95% on 4L/[place] Wt: 183.6>173.7>171.5>173.7>159.8>162.2 lbs    LVAD interrogation reveals:  Speed: 5200 Flow: 3.5 Power: 3.6 w PI: 6  Alarms: LF @ 0600 today and last night @ 2108 Events: 3 today  Hematocrit: 26 Fixed speed: 5200 Low speed limit: 4900   Drive Line: CDI. Drive line anchor secure. Advance to MWF dressing changes by VAD coordinator or nurse champion. Next dressing change due 01/04/25.   Labs:  LDH trend: [phone]  INR trend:  1.3>1.5>1.6>1.3>1.4>1.6>2>2.7>3.4  WBC trend: 18.5>23.6>21.0>15.2>17.4>15.6>19>21.3>18.8  Hgb trend: 9.2>8.5>7.6>8.9>9.5>8.7>8.1>9.6>8.1  Anticoagulation Plan: - INR Goal: 2.0 - 2.5 - ASA Dose: none - Heparin  gtt per pharmacy - Coumadin  dosing per pharmacy  Blood Products:  - Intra-op 1/23:   2 PRBC  4 FFP  225 cc cell saver - Post op:  1/26: 1 PRBC  Device: - [employer] -Therapies: OFF  Arrythmias:   Respiratory: Extubated 1/24. Reintubated 1/25 due to respiratory distress. Extubated 1/27.   Infection:   Renal:  -BUN/CRT: 10/1.3>11/1.15>23/1.34>19/1.03>15/1>22/1.22>34/1.28>45/1.42>23/1.65  Adverse Events on VAD:  Drips:  Epinephrine  0.5 mcg/min Milrinone  0.125 mcg/kg/min Heparin  500 units/hr-off Amiodarone  30mg /hr-off Lasix  15 mg/hr-off  Patient Education:  Pt's daughter Jon performed dressing change today at bedside with VAD coordinator assisting  Plan/Recommendations:  1. Page VAD coordinator with any drive line or equipment concerns 2. MWF drive line dressing changes per VAD coordinator or nurse champion  Lauraine Ip RN, BSN VAD Coordinator 24/7 Pager [phone]

## 2025-01-03 NOTE — Progress Notes (Signed)
 TCTS Evening Rounds:  Hemodynamically stable on milrinone  0.125 and epi 0.5. LVAD parameters stable.  Good UO  Afebrile.  Still not eating much. Complains of lower abdominal discomfort.  Ambulated a small loop today.  Sitting up in chair with her family.

## 2025-01-04 DIAGNOSIS — K921 Melena: Secondary | ICD-10-CM

## 2025-01-04 DIAGNOSIS — D62 Acute posthemorrhagic anemia: Secondary | ICD-10-CM | POA: Insufficient documentation

## 2025-01-04 DIAGNOSIS — I429 Cardiomyopathy, unspecified: Secondary | ICD-10-CM

## 2025-01-04 DIAGNOSIS — R570 Cardiogenic shock: Secondary | ICD-10-CM

## 2025-01-04 LAB — CBC
HCT: 24.5 % — ABNORMAL LOW (ref 36.0–46.0)
HCT: 29.4 % — ABNORMAL LOW (ref 36.0–46.0)
HCT: 29.4 % — ABNORMAL LOW (ref 36.0–46.0)
Hemoglobin: 7.4 g/dL — ABNORMAL LOW (ref 12.0–15.0)
Hemoglobin: 9.2 g/dL — ABNORMAL LOW (ref 12.0–15.0)
Hemoglobin: 9.2 g/dL — ABNORMAL LOW (ref 12.0–15.0)
MCH: 28.7 pg (ref 26.0–34.0)
MCH: 29.4 pg (ref 26.0–34.0)
MCH: 29.4 pg (ref 26.0–34.0)
MCHC: 30.2 g/dL (ref 30.0–36.0)
MCHC: 31.3 g/dL (ref 30.0–36.0)
MCHC: 31.3 g/dL (ref 30.0–36.0)
MCV: 95 fL (ref 80.0–100.0)
MCV: 93.9 fL (ref 80.0–100.0)
MCV: 93.9 fL (ref 80.0–100.0)
Platelets: 568 10*3/uL — ABNORMAL HIGH (ref 150–400)
Platelets: 573 10*3/uL — ABNORMAL HIGH (ref 150–400)
Platelets: 571 10*3/uL — ABNORMAL HIGH (ref 150–400)
RBC: 2.58 MIL/uL — ABNORMAL LOW (ref 3.87–5.11)
RBC: 3.13 MIL/uL — ABNORMAL LOW (ref 3.87–5.11)
RBC: 3.13 MIL/uL — ABNORMAL LOW (ref 3.87–5.11)
RDW: 16.3 % — ABNORMAL HIGH (ref 11.5–15.5)
RDW: 15.7 % — ABNORMAL HIGH (ref 11.5–15.5)
RDW: 16 % — ABNORMAL HIGH (ref 11.5–15.5)
WBC: 18.4 10*3/uL — ABNORMAL HIGH (ref 4.0–10.5)
WBC: 16.6 10*3/uL — ABNORMAL HIGH (ref 4.0–10.5)
WBC: 15.2 10*3/uL — ABNORMAL HIGH (ref 4.0–10.5)
nRBC: 0.5 % — ABNORMAL HIGH (ref 0.0–0.2)
nRBC: 0.4 % — ABNORMAL HIGH (ref 0.0–0.2)
nRBC: 0.5 % — ABNORMAL HIGH (ref 0.0–0.2)

## 2025-01-04 LAB — RENAL FUNCTION PANEL
Albumin: 3.1 g/dL — ABNORMAL LOW (ref 3.5–5.0)
Anion gap: 9 (ref 5–15)
BUN: 33 mg/dL — ABNORMAL HIGH (ref 8–23)
CO2: 27 mmol/L (ref 22–32)
Calcium: 9 mg/dL (ref 8.9–10.3)
Chloride: 100 mmol/L (ref 98–111)
Creatinine, Ser: 1.26 mg/dL — ABNORMAL HIGH (ref 0.44–1.00)
GFR, Estimated: 45 mL/min — ABNORMAL LOW
Glucose, Bld: 114 mg/dL — ABNORMAL HIGH (ref 70–99)
Phosphorus: 3.3 mg/dL (ref 2.5–4.6)
Potassium: 3.9 mmol/L (ref 3.5–5.1)
Sodium: 135 mmol/L (ref 135–145)

## 2025-01-04 LAB — PROTIME-INR
INR: 2.4 — ABNORMAL HIGH (ref 0.8–1.2)
Prothrombin Time: 27.4 s — ABNORMAL HIGH (ref 11.4–15.2)

## 2025-01-04 LAB — GLUCOSE, CAPILLARY
Glucose-Capillary: 190 mg/dL — ABNORMAL HIGH (ref 70–99)
Glucose-Capillary: 115 mg/dL — ABNORMAL HIGH (ref 70–99)
Glucose-Capillary: 136 mg/dL — ABNORMAL HIGH (ref 70–99)
Glucose-Capillary: 167 mg/dL — ABNORMAL HIGH (ref 70–99)
Glucose-Capillary: 158 mg/dL — ABNORMAL HIGH (ref 70–99)
Glucose-Capillary: 68 mg/dL — ABNORMAL LOW (ref 70–99)
Glucose-Capillary: 105 mg/dL — ABNORMAL HIGH (ref 70–99)
Glucose-Capillary: 128 mg/dL — ABNORMAL HIGH (ref 70–99)
Glucose-Capillary: 81 mg/dL (ref 70–99)

## 2025-01-04 LAB — COOXEMETRY PANEL
Carboxyhemoglobin: 3.1 % — ABNORMAL HIGH (ref 0.5–1.5)
Methemoglobin: <0.7 % (ref 0.0–1.5)
O2 Saturation: 63.3 %
Total hemoglobin: 7.9 g/dL — ABNORMAL LOW (ref 12.0–16.0)

## 2025-01-04 LAB — PREPARE RBC (CROSSMATCH)

## 2025-01-04 LAB — LACTATE DEHYDROGENASE: LDH: 330 U/L — ABNORMAL HIGH (ref 105–235)

## 2025-01-04 LAB — MAGNESIUM: Magnesium: 2.2 mg/dL (ref 1.7–2.4)

## 2025-01-04 MED ORDER — SODIUM CHLORIDE 0.9% IV SOLUTION: Freq: Once | INTRAVENOUS | Status: AC

## 2025-01-04 MED ORDER — OLANZAPINE 2.5 MG PO TABS
2.5000 mg | ORAL_TABLET | Freq: Every day | ORAL | Status: AC
  Administered 2025-01-04 – 2025-01-06 (×3): 2.5 mg via ORAL
  Filled 2025-01-04 (×3): qty 1

## 2025-01-04 MED ORDER — POLYETHYLENE GLYCOL 3350 17 G PO PACK
17.0000 g | PACK | Freq: Every day | ORAL | Status: AC
  Administered 2025-01-06: 17 g via ORAL
  Filled 2025-01-04 (×2): qty 1

## 2025-01-04 MED ORDER — DOCUSATE SODIUM 100 MG PO CAPS
100.0000 mg | ORAL_CAPSULE | Freq: Every day | ORAL | Status: AC
  Administered 2025-01-04 – 2025-01-06 (×2): 100 mg via ORAL
  Filled 2025-01-04 (×2): qty 1

## 2025-01-04 MED ORDER — SALINE SPRAY 0.65 % NA SOLN
1.0000 | NASAL | Status: AC | PRN
  Administered 2025-01-04: 1 via NASAL
  Filled 2025-01-04: qty 44

## 2025-01-04 MED ORDER — OXYCODONE HCL 5 MG PO TABS
2.5000 mg | ORAL_TABLET | ORAL | Status: AC | PRN
  Administered 2025-01-04 – 2025-01-06 (×9): 2.5 mg via ORAL
  Filled 2025-01-04 (×8): qty 1

## 2025-01-04 MED ORDER — GABAPENTIN 100 MG PO CAPS
200.0000 mg | ORAL_CAPSULE | Freq: Every day | ORAL | Status: DC
  Filled 2025-01-04: qty 2

## 2025-01-04 NOTE — Progress Notes (Signed)
 "  NAME:  Karen Warner, MRN:  [id number], DOB:  [birth year], LOS: 20 ADMISSION DATE:  12/14/2024, CONSULTATION DATE:  1/23 REFERRING MD:  Lucas, CHIEF COMPLAINT:  post-LVAD   History of Present Illness:  [name] is a 74 y/o woman with a history of ICM and HFrEF who presented on 1/14 with chest pain and decompensated heart failure. She underwent left and right heart catheterization this admission showing low CO and low filling pressures.  She was managed with inotropic support. Due to progression of disease and poor tolerance of GDMT, she was evaluated for LVAD.  Intraoperatively her course was uncomplicated other than low flows on LVAD when her BP went up at the end of the case when she was stimulated. She transferred to the ICU on epi 2, NE 1, milrinone  0.386mcg. TEE with normal RV function, evidence of intrapulmonary shunt.  Paralytics not reversed in ICU. Bypass time , crossclamp time 29 min, cell saver 225cc. LVAD speed 4600 RPM. Alarms on LVAD went off during periods of hypertension post-op, controlled by afterload reduction.   Pertinent  Medical History  HFrEF due to ICM DM2 IDA FM CKD3  Significant Hospital Events: Including procedures, antibiotic start and stop dates in addition to other pertinent events   1/14 admission 1/15 L&R heart cath> nonobstructive disease, low filling pressures 1/23 LVAD 1/24 extubated; fluid boluses & milrinone  increased due to frequent LVAD alarms with high PI 1/25 reintubated for respiratory distress, worsening lactic acidosis 1/26 Tmax 100.4 on vanc and zosyn , remains intubated  1/27 extubated, remains with labile blood pressure, worsening fever/tachycardia and hypoxia requiring BiPap and resumption of inhaled nitric, cultured and broadened to vanc and meropenem , some low flows associated with hypertension. Steroids added for fevers 1/28 stopped cleviprex > improved respiratory status (pulmonary shunting) 1/29 Eating jello. Pain is  better today since she could start oral pain meds. SOB is stable.  2/1 Able to walk in the hall way  Interim History / Subjective:  Patient had another bout of melena overnight.  Hemoglobin dropped from 8.9-7.4.  Hemodynamically stable.  GI is aware and evaluating patient.  Holding Coumadin .  Objective    Blood pressure 92/78, pulse 95, temperature 97.8 F (36.6 C), temperature source Oral, resp. rate 19, height 5' 5 (1.651 m), weight 73.1 kg, SpO2 98%. CVP:  [2 mmHg-13 mmHg] 13 mmHg      Intake/Output Summary (Last 24 hours) at 01/04/2025 1323 Last data filed at 01/04/2025 0600 Gross per 24 hour  Intake 56.49 ml  Output 500 ml  Net -443.51 ml   Filed Weights   01/02/25 0500 01/03/25 0600 01/04/25 0300  Weight: 72.5 kg 73.6 kg 73.1 kg   Examination: Physical exam: General: Chronically ill-appearing female, sitting in chair. HEENT: [place]/AT, eyes anicteric.  moist mucus membranes Neuro: Alert, awake following commands Chest: Coarse breath sounds, no wheezes or rhonchi LVAD hum noted. Heart: Regular rate and rhythm, no murmurs or gallops Abdomen: Soft, nondistended with mild epigastric tenderness no guarding rigidity no rebound tenderness, bowel sounds present    Resolved problem list   Assessment and Plan    AoC HfrEF, ICM- s/p LVAD P -LVAP, inotropes per HF  -amio  -sildenafil   -AC held because of GI bleed - On milrinone  and epinephrine .  Acute resp failure w hypoxia Plan -IS, mobility -cont bronchodilators   Anemia UGIB  GERD AoC abd pain -Hx diverticulitis, polyps  -heme + stool 2/2 P -BID PPI - Status post 1 unit blood transfusion overnight.  Hemoglobin  improved from 7.4 to 9.2. -Appreciate GI input plans for EGD when INR is less than 2 likely tomorrow -simethicone  added 2/3   Leukocytosis -no clear infectious source -has had about 1 wk broad abx  P -abx stopped 2/2, WBC down-trending -follow off abx   AKI on CKD 3b  Hypokalemia P -follow renal  indices, UOP  -K is being replaced   DM2 w hyperglycemia P -SSI + glargine 24 u/d   At risk for malnutrition  -encourage PO intake   Physical deconditioning  -PT/OT mobility   H/o fibromyalgia  Plan  -holding PTA trintellix  due to fever concern mobilize, optimize sleep  Full Code On coumadin  Clear liquid diet for today  Tamela Stakes, MD  Attending Physician, Critical Care Medicine [facility] [facility] See Amion for pager For contact information, see Amion. If no response to pager, please call PCCM 2H APP. After hours, 7PM- 7AM, please call on call APP for 2H.      "

## 2025-01-04 NOTE — Progress Notes (Signed)
 LVAD Coordinator Rounding Note:  Admitted 12/14/24 due to CHF exacerbation. Underwent LVAD evaluation.   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  1/23: S/P HMIII Implant.Intra-op speed placed at 4600 due to low filling pressures.   1/24: Extubated. Had some low flows. Cleveprex started. 1/25: Re-intubated with desaturation, increased work of breathing 1/26: Ramp echo, speed increased to 5100 rpm.  1 unit PRBCs. 1/27: Extubated. Increased work of breathing, back on Bipap 1/28: Stopped Clevidipine  2/2: MAPs low (60s). Given IVF back. Stopped sildenafil . Ongoing abdominal pain. KUB with dilated colon.   KUB performed 01/02/25: Mild diffuse gaseous distention of the colon, unchanged from prior abdominal radiograph from 12/31/2024. No small bowel dilatation to indicate ileus or obstruction.   Pt is lying bed asleep. Hgb has drifted down with 3 dark, tarry stools yesterday. Hemoccult +.  Hgb 7.4- Received 1 PRBC this morning. For repeat CBC later today. Had 1 low flow overnight. Plan to continue to hold Coumadin  per provider note. GI consulted- plan for enteroscopy once INR lower; possibly tomorrow.   Pts daughter Luke remains in the room. Spoke with daughter Mercy on the phone. She plans to bring FMLA paperwork for VAD coordinators to fill out.   Vital signs: Temp: 98.1 HR: 92 Doppler Pressure: 82 Automatic BP: 92/78 (84) O2 Sat: 98% on 4L/[place] Wt: 183.6>173.7>171.5>173.7>159.8>162.2>161.1 lbs    LVAD interrogation reveals:  Speed: 5200 Flow: 3.8 Power: 3.5 w PI: 4.4  Alarms: LF early this morning Events: 5 PI events so far today  Hematocrit: 25 Fixed speed: 5200 Low speed limit: 4900   Drive Line:  Existing VAD dressing removed and site care performed using sterile technique by daughter Luke with VAD coordinator assisting. Drive line exit site cleaned with Chlora prep applicators x 2, allowed to dry, and Silverlon patch with gauze dressing  applied. Exit site unincorporated, the velour is fully implanted at exit site. Small amount of sanguinous drainage noted on previous dressing. No redness, tenderness, foul odor or rash noted. Drive line anchor re-applied. Continue MWF dressing changes by VAD coordinator or nurse champion. Next dressing change due 01/06/25.     Labs:  LDH trend: [phone]  INR trend: 1.3>1.5>1.6>1.3>1.4>1.6>2>2.7>3.4>2.4  WBC trend: 18.5>23.6>21.0>15.2>17.4>15.6>19>21.3>18.8>18.4  Hgb trend: 9.2>8.5>7.6>8.9>9.5>8.7>8.1>9.6>8.1>7.4  Anticoagulation Plan: - INR Goal: 2.0 - 2.5 - ASA Dose: none - Heparin  gtt per pharmacy - Coumadin  dosing per pharmacy  Blood Products:  - Intra-op 1/23:   2 PRBC  4 FFP  225 cc cell saver - Post op:  1/26: 1 PRBC  Device: - [employer] -Therapies: OFF  Arrythmias:   Respiratory: Extubated 1/24. Reintubated 1/25 due to respiratory distress. Extubated 1/27.   Infection:   Renal:  -BUN/CRT: 10/1.3>11/1.15>23/1.34>19/1.03>15/1>22/1.22>34/1.28>45/1.42>23/1.65  Adverse Events on VAD:  Drips:  Epinephrine  0.5 mcg/min Milrinone  0.125 mcg/kg/min Heparin  500 units/hr-off Amiodarone  30mg /hr-off Lasix  15 mg/hr-off  Patient Education:  Pt's daughter Luke performed dressing change today at bedside with VAD coordinator assisting  Plan/Recommendations:  1. Page VAD coordinator with any drive line or equipment concerns 2. MWF drive line dressing changes per VAD coordinator or nurse champion  Isaiah Knoll RN VAD Coordinator  Office: [phone]  24/7 Pager: [phone]

## 2025-01-04 NOTE — Progress Notes (Signed)
 "    Daily Progress Note  DOA: 12/14/2024 Hospital Day: 22  Cc:  Reconsult : GI bleed and abdominal pain    Brief Summary: Patient is 74 yo female with ischemic cardiomyopathy s/p recent LVAD. We saw her this admission on 1/16 for abdominal pain / chest pain , refer to that consult for details. In summary, we felt pain was likely 2/2 to severe CHF and also with musculoskeletal component. She had neg US  and CT scan . Since then she has undergone LVAD placement. We have been reconsulted for GI bleed, she has ongoing generalized abdominal pain as well  Assessment   # 74 year old female with CAD, ischemic cardiomyopathy status post recent LVAD  # GI bleed with melena on warfarin # Acute on chronic anemia in setting of GI bleed  Spoke with RN at bedside.  Patient had 2 dark tarry stools yesterday with decline in  hgb from 8.9 to 7.4 overnight Received a unit of RBCs this a.m. .  Follow-up hemoglobin 9.2 posttransfusion hemoglobin   # Generalized abdominal pain / bloating.  Etiology unclear. We evaluated her for this earlier this admission, thought pain musculoskeletal and possible related to heart failure. Continues to have generalized abdominal pain. Has some nausea, appetite not great but daughter.  Non-contrast CT in January unrevealing except for gallbladder sludge but pain is diffuse no localized to suggest biliary origin  # Leukocytosis ? etiology  # Diverticular disease, s/p left hemicolectomy   # COPD  # CKD  # DM2   Plan: - Continue twice daily IV pantoprazole  - Trend H&H - Patient will need EGD, possibly tomorrow if INR is acceptable. Heart Failure Team holding Warfarin. INR 2.4 today  -Clear liquids okay today  Principal Problem:   Acute on chronic systolic heart failure (HCC) Active Problems:   Abdominal pain   Diabetes mellitus (HCC)   LVAD (left ventricular assist device) present (HCC)   Chronic respiratory failure with hypoxia (HCC)   Acute hypoxic  respiratory failure (HCC)   Fever    Recent Labs    01/03/25 0916 01/03/25 1212 01/04/25 0341  WBC 18.8* 23.3* 18.4*  HGB 8.1* 8.9* 7.4*  HCT 26.2* 28.6* 24.5*  MCV 93.6 93.5 95.0  PLT 540* 681* 568*   No results for input(s): FOLATE, VITAMINB12, FERRITIN, TIBC, IRONPCTSAT in the last 72 hours. Recent Labs    01/03/25 0406 01/03/25 1452 01/04/25 0341  NA 141 135 135  K 2.8* 4.3 3.9  CL 105 98 100  CO2 24 28 27   GLUCOSE 105* 116* 114*  BUN 23 35* 33*  CREATININE 1.65* 1.25* 1.26*  CALCIUM  9.0 9.4 9.0   Recent Labs    01/02/25 0158 01/03/25 0406 01/04/25 0341  ALBUMIN  3.0* 3.2* 3.1*     Imaging:  DG Abd Portable 1V CLINICAL DATA:  Abdominal pain.  EXAM: PORTABLE ABDOMEN - 1 VIEW  COMPARISON:  12/31/2024  FINDINGS: Mild diffuse gaseous distention of the colon similar to prior radiographs of the abdomen from 12/31/2024. No small bowel dilatation to indicate ileus or obstruction.  Enteric tube is unchanged in position terminating, likely within the distal stomach/proximal duodenum.  AICD and LEFT ventricular assist device partially visualized.  IMPRESSION: Mild diffuse gaseous distention of the colon, unchanged from prior abdominal radiograph from 12/31/2024. No small bowel dilatation to indicate ileus or obstruction.  Electronically Signed   By: Aliene Lloyd M.D.   On: 01/02/2025 13:37 DG Chest Port 1 View CLINICAL DATA:  Left ventricular assist device.  EXAM: PORTABLE CHEST 1 VIEW  COMPARISON:  01/01/2025  FINDINGS: The cardio pericardial silhouette is enlarged. No overt edema. Left base largely obscured. Lungs otherwise appear clear with interval decrease in right base atelectasis seen previously. Left ventricular assist device again noted with left-sided permanent pacemaker/AICD and battery pack for stimulator device over the right chest. Right PICC line tip overlies the SVC/RA junction.  IMPRESSION: 1. Interval decrease in  right base atelectasis. 2. Opacification of the left base previously not well demonstrated today given superimposition of support devices.  Electronically Signed   By: Camellia Candle M.D.   On: 01/02/2025 08:02     Scheduled inpatient medications:   sodium chloride    Intravenous Once   acetaminophen   650 mg Oral Q6H   arformoterol   15 mcg Nebulization BID   Chlorhexidine  Gluconate Cloth  6 each Topical Daily   docusate sodium   100 mg Oral Daily   gabapentin   200 mg Oral QHS   insulin  aspart  0-24 Units Subcutaneous Q4H   insulin  glargine-yfgn  24 Units Subcutaneous Daily   lactose free nutrition  237 mL Oral TID WC   lidocaine   2 patch Transdermal Q24H   mouth rinse  15 mL Mouth Rinse 4 times per day   pantoprazole  (PROTONIX ) IV  40 mg Intravenous Q12H   polyethylene glycol  17 g Oral Daily   revefenacin   175 mcg Nebulization Daily   sodium chloride  flush  10-40 mL Intracatheter Q12H   sodium chloride  flush  3 mL Intravenous Q12H   Vitamin D  (Ergocalciferol )  50,000 Units Oral Q Sun   vortioxetine  HBr  5 mg Oral Daily   Warfarin - Pharmacist Dosing Inpatient   Does not apply q1600   Continuous inpatient infusions:   epinephrine  0.5 mcg/min (01/04/25 0137)   milrinone  0.125 mcg/kg/min (01/04/25 0351)   PRN inpatient medications: calcium  carbonate, dextrose , fentaNYL  (SUBLIMAZE ) injection, Gerhardt's butt cream, LORazepam , ondansetron  (ZOFRAN ) IV, mouth rinse, oxyCODONE , simethicone , sodium chloride , sodium chloride  flush, sodium chloride  flush  Vital signs in last 24 hours: Temp:  [97.5 F (36.4 C)-98.9 F (37.2 C)] 98.1 F (36.7 C) (02/04 0841) Pulse Rate:  [94-104] 95 (02/04 0602) Resp:  [16-30] 19 (02/04 0930) BP: (57-112)/(45-93) 92/78 (02/04 0930) SpO2:  [92 %-99 %] 98 % (02/04 0602) Weight:  [73.1 kg] 73.1 kg (02/04 0300) Last BM Date : 01/03/25  Intake/Output Summary (Last 24 hours) at 01/04/2025 1015 Last data filed at 01/04/2025 0600 Gross per 24 hour  Intake  69.92 ml  Output 800 ml  Net -730.08 ml    Intake/Output from previous day: 02/03 0701 - 02/04 0700 In: 257 [I.V.:128.1; IV Piggyback:128.9] Out: 1125 [Urine:1125] Intake/Output this shift: No intake/output data recorded.   Physical Exam:  General: Alert female in NAD Heart:  Regular rate and rhythm. LVAD hum Pulmonary: Normal respiratory effort Abdomen: Soft, nondistended, mild generalized tenderness. Normal bowel sounds. Neurologic: Alert and oriented Psych: Pleasant. Cooperative     LOS: 20 days   Vina Dasen ,NP 01/04/2025, 10:15 AM       "

## 2025-01-04 NOTE — Progress Notes (Signed)
 PHARMACY - ANTICOAGULATION CONSULT NOTE  Pharmacy Consult for warfarin (held) Indication: LVAD  Allergies[1]  Patient Measurements: Height: 5' 5 (165.1 cm) Weight: 73.1 kg (161 lb 2.5 oz) IBW/kg (Calculated) : 57 HEPARIN  DW (KG): 72  Vital Signs: Temp: 97.8 F (36.6 C) (02/04 1121) Temp Source: Oral (02/04 1121) BP: 92/78 (02/04 0930) Pulse Rate: 95 (02/04 0602)  Labs: Recent Labs    01/02/25 0158 01/02/25 0359 01/03/25 0406 01/03/25 0916 01/03/25 1212 01/03/25 1452 01/04/25 0341 01/04/25 0958  HGB  --    < >  --    < > 8.9*  --  7.4* 9.2*  HCT  --    < >  --    < > 28.6*  --  24.5* 29.4*  PLT  --    < >  --    < > 681*  --  568* 573*  LABPROT 30.0*  --  36.1*  --   --   --  27.4*  --   INR 2.7*  --  3.4*  --   --   --  2.4*  --   CREATININE 1.42*  --  1.65*  --   --  1.25* 1.26*  --    < > = values in this interval not displayed.    Estimated Creatinine Clearance: 39.2 mL/min (A) (by C-G formula based on SCr of 1.26 mg/dL (H)).   Medical History: Past Medical History:  Diagnosis Date   AICD (automatic cardioverter/defibrillator) present    Anemia 12/12/2019   Anginal pain    Arthralgia 01/14/2020   Arthritis    Asthma    Atherosclerotic heart disease of native coronary artery without angina pectoris 04/13/2014   CHF (congestive heart failure) (HCC)    Chronic bilateral low back pain with bilateral sciatica 01/13/2019   Chronic bladder pain 11/08/2014   Last Assessment & Plan:  Formatting of this note might be different from the original. Patient has chronic pain syndrome in general I think this is unfortunately worsening her pelvic pain and bladder pain her examination today was very reassuring I am advised her to use the estrogen cream I did start her on Elavil 10 milligrams at that time if she does tolerated will increase it to 25 milligrams.    Chronic headaches    Chronic idiopathic constipation 12/22/2014   Formatting of this note might be different  from the original. Last Assessment & Plan:  For better bowel emptying please use either Citrucel / Benefiber start with 2 tablespoons daily and titrate up or down to effect.  Also please use glycerin  suppositories as needed to assist in evacuation. Last Assessment & Plan:  Formatting of this note might be different from the original. For better bowel empt   Chronic obstructive pulmonary disease, unspecified (HCC) 03/18/2018   CKD (chronic kidney disease) stage 3, GFR 30-59 ml/min (HCC)    Class 1 obesity due to excess calories with serious comorbidity and body mass index (BMI) of 31.0 to 31.9 in adult 06/26/2020   Colon polyps    COPD (chronic obstructive pulmonary disease) (HCC)    Coronary artery disease    DDD (degenerative disc disease), cervical 01/13/2019   Depression 11/05/2019   Diabetic polyneuropathy associated with type 2 diabetes mellitus (HCC) 02/24/2019   Diverticulitis 05/20/2018   Diverticulitis of colon 03/18/2018   Family history of ischemic heart disease (IHD) 08/16/2013   Fibromyalgia    Fibromyalgia affecting multiple sites 01/14/2020   Gastro-esophageal reflux disease without esophagitis 01/13/2019  Glaucoma 11/08/2014   Hordeolum externum of left upper eyelid 03/13/2020   Hypertension 11/08/2014   IBS (irritable bowel syndrome)    Iron deficiency anemia 01/13/2019   Left renal mass 11/08/2019   Leukocytosis 05/28/2018   Low vitamin B12 level 03/13/2020   Low vitamin D  level 12/12/2019   LV dysfunction 05/31/2019   Malignant essential hypertension 01/22/2016   Migraine, unspecified, not intractable, without status migrainosus 11/08/2014   Mixed hyperlipidemia 01/13/2019   Myocardial infarction (HCC)    Other premature beats 11/08/2014   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 Last Assessment & Plan:  For vaginal atrophy please place a pea size dab of Estrogen vaginal cream  into the vagina 3 times a week ( Monday, Wednesday, Friday) Last Assessment & Plan:  Formatting of this note might be different from the original. For vaginal atrophy please place a pea size dab of Estrogen vaginal cream  into the vagina     Medications:  Scheduled:   sodium chloride    Intravenous Once   acetaminophen   650 mg Oral Q6H   arformoterol   15 mcg Nebulization BID   Chlorhexidine  Gluconate Cloth  6 each Topical Daily   docusate sodium   100 mg Oral Daily   gabapentin   200 mg Oral QHS   insulin  aspart  0-24 Units Subcutaneous Q4H   insulin  glargine-yfgn  24 Units Subcutaneous Daily   lactose free nutrition  237 mL Oral TID WC   lidocaine   2 patch Transdermal Q24H   mouth rinse  15 mL Mouth Rinse 4 times per day   pantoprazole  (PROTONIX ) IV  40 mg Intravenous Q12H   polyethylene glycol  17 g Oral Daily   revefenacin   175 mcg Nebulization Daily   sodium chloride  flush  10-40 mL Intracatheter Q12H   sodium chloride  flush  3 mL Intravenous Q12H   Vitamin D  (Ergocalciferol )  50,000 Units Oral Q Sun   vortioxetine  HBr  5 mg Oral Daily   Warfarin - Pharmacist Dosing Inpatient   Does not apply q1600    Assessment: Patient is a 74 year old female with new LVAD placement 1/23. She was not on anticoagulation PTA. Pharmacy is consulted to dose warfarin for LVAD.   INR at goal at 324, LDH stable, CBC ok.  Pharmacy asked to hold warfarin tonight to allow INR to drop for GI scope.  Goal of Therapy:  INR 2-2.5   Monitor platelets by anticoagulation protocol: Yes   Plan:  Hold warfarin until after scope. Daily INR  Harlene Barlow, [employer], [facility] Clinical Pharmacist  01/04/2025 12:46 PM   [facility] [place]  pharmacy phone numbers are listed on [email or web address]            [1]  Allergies Allergen Reactions   Bee Venom Anaphylaxis   Sumatriptan Shortness Of Breath    Migraine worsened   Amoxicillin-Pot Clavulanate Diarrhea and Nausea And Vomiting    GI Intolerance   Oxycodone  Itching and Hives    Other reaction(s): Other (see comments) Funny feeling in head  off the edge    Buprenorphine Hcl     Other reaction(s): Itching  Clarithromycin Diarrhea    Abdominal pain   Duloxetine  Hcl Other (See Comments)    drowsiness   Hydrocodone Itching   Lactose Intolerance (Gi) Other (See Comments)    Upset stomach, gas/bloating   Liraglutide Other (See Comments)    Abdominal discomfort   Tramadol Hcl Itching

## 2025-01-04 NOTE — Progress Notes (Signed)
 This chaplain is present for F/U spiritual care. The Pt. is sitting on the side of the bed with the assistance of PT. The RNs are participating and collaborating with Pt. wound care.   The chaplain observed the Pt. weakness and perseverance in attempting to walk with the assistance of PT. The Pt. daughter in law is visiting the Pt. and provides interaction and support as needed.  This chaplain quickly reflects on previous visits with the Pt. and offers support as needed before leaving the room.  Chaplain Leeroy Hummer [phone]

## 2025-01-04 NOTE — Progress Notes (Signed)
" ° ° °  PROCEDURAL EXPEDITER PROGRESS NOTE  Patient Name: Karen Warner  DOB:1951-06-13 Date of Admission: 12/14/2024  Date of Assessment:01/04/25   -------------------------------------------------------------------------------------------------------------------   Brief clinical summary: pt is a 74 yr old female having a EGD on 01/05/25.    Orders in place:  No   Communication with surgical team if no orders: n/a  Labs, test, and orders reviewed: yes  Requires surgical clearance:  No   -------------------------------------------------------------------------------------------------------------------  [facility] Health Patient Care Command Expediter, Karen Warner Please contact us  directly via secure chat (search for [facility]) or by calling us  at [phone] [facility]).  "

## 2025-01-04 NOTE — Progress Notes (Signed)
 Hypoglycemic Event  CBG: 68  Treatment: 4 oz juice/soda  Symptoms: None  Follow-up CBG: Time:1550 CBG Result:105  Possible Reasons for Event: Medication regimen, inadequate intake   Comments/MD notified: Tinnie Furth, PA    Tyrek Lawhorn G Sheza Strickland

## 2025-01-04 NOTE — Progress Notes (Signed)
" °  Inpatient Rehabilitation Admissions Coordinator   Met with patient and daughter in law, Luke, at bedside for rehab assessment. We discussed goals and expectations of a possible CIR admit. Daughter, Jon and DIL, Luke to be primary caregivers. Luke will take FMLA and moving from [place] of [place] to assist. I await further medical/surgical/therapy progress to assist with planning dispo when appropriate. They prefer CIR for rehab. Family can provide expected caregiver support that is recommended. Please call me with any questions.   Heron Leavell, RN, MSN Rehab Admissions Coordinator [phone]   "

## 2025-01-04 NOTE — Progress Notes (Signed)
 Palliative:  HPI: 74 y.o. female with past medical history of HFrEF EF <20%, ICM, CAD, s/p Environmental Manager CRT-D, s/p Barostim device implant, iron deficiency anemia, CKD stage 3b, COPD, fibromyalgia, diverticulosis, GERD admitted on 12/14/2024 with chest pain with acute on chronic heart failure exacerbation and low output heart failure. Palliative care consulted for LVAD evaluation. 1/23 S/P VAD. 1/24 extubated, high PI alarms. 1/25 reintubated due to respiratory distress. 1/27 extubated but required BiPAP transiently. 2/4 concern for bleeding.     I met today at Karen Warner's bedside. She is awake and in chair position in chair. Concern for bleeding s/p transfusion. Plans for potential scope with GI. Luke (daughter-in-law) in room and has just completed dressing change education. [name] reports occasional abdominal pain and bloating/pressure but no real nausea. Appetite has remained poor. Her main complaint is insomnia and lack of sleep. She tells me if I could just have one good night of sleep - she feels this would give her the energy and motivation she needs. She tells me she is hanging in there but just wants to have some good sleep.   All questions/concerns addressed. Emotional support provided.   I discussed with heart failure PA and PCCM MD consideration of low dose Zyprexa  that could benefit insomnia, appetite, anxiety/depression, and even nausea.   Exam: Alert, oriented. No distress. VAD. Breathing regular, unlabored. Abd soft. Generalized weakness and fatigue.   Plan: - Time for improvement. Slow course of recovery.  - Zyprexa  2.5 mg at bedtime recommended.   25 min  Bernarda Kitty, NP Palliative Medicine Team Pager [phone] (Please see [email or web address] for schedule) Team Phone [phone]

## 2025-01-04 NOTE — Progress Notes (Addendum)
 Patient ID: Karen Warner Karen Warner, female   DOB: November 23, 1951, 74 y.o.   MRN: [id number]     Advanced Heart Failure Rounding Note  Cardiologist: Dub Huntsman, DO  [facility] Cardiologist: Dr Rolan    Patient Profile   Karen Warner is a 74 y.o. female with history of CAD and ischemic cardiomyopathy, EF <20%, CKD III and COPD admitted w/ a/c CHF, chest/abdominal pain. RHC c/w low output.   Significant events:   1/15: RHC (RA 2, PA 21/10, PCW 3,  PA sat 59%, TD CI 1.49, FICK CI 1.88, PAPi 5.5), started on milrinone            LHC patent stents, nonobstructive CAD   1/19: co-ox persistently in 40s, milrinone  increased 0.375 12/23/24: S/P HMIII Implant.Intra-op speed placed at 4700 due to low filling pressures.  Received 2UPRBC, 3 FFP, cellsaver, 1 albumin , and 4 FFP  12/23/24 POCUS: VAD well positioned. RV looked ok but PAPI low with high CVP 12/24/24: Extubated. Had some low flows. Cleveprex started 1/25: Re-intubated with desaturation, increased work of breathing 1/26: Ramp echo, speed increased to 5100 rpm.  1 unit PRBCs. 1/27: Extubated. Increased work of breathing, back on Bipap 1/28: Stopped Clevidipine  2/2: MAPs low (60s). Given IVF back. Stopped sildenafil . Ongoing abdominal pain. KUB with dilated colon.  Subjective:    POD #12  Co-ox 63% on 0.5 Epi + 0.125 milrinone .  Hgb drifting down last few days, 7.4 early this am. Hemoccult positive with 3 dark, tarry stools yesterday. 1 u RBCs running.  Multiple musculoskeletal complaints.   HeartMate 3 VAD Equipment Check Pump Speed (RPM): 5200 RPM Pump Flow (LPM): 3.7 Power (Watts): 3 Watts Pulsatility Index: 5.1 Fixed Speed Limit: 5200 rpm Low Speed Limit: 4900 rpm Alarms: Other (Comment) (low flow alarm with ambulation) Auscultated: Normal expected humming Power Module Self-Test (Daily): Done System Controller Self-Test: Passed Patient Battery Source: [facility] / Wall unit Emergency Equipment at Bedside: Yes  1 Low flow  alarm  LDH 330(downtrending slowly) INR 2.4 (holding warfarin)  Objective:   Weight Range: 73.1 kg Body mass index is 26.82 kg/m.   Vital Signs:   Temp:  [97.5 F (36.4 C)-98.9 F (37.2 C)] 98.1 F (36.7 C) (02/04 0841) Pulse Rate:  [94-104] 95 (02/04 0602) Resp:  [17-30] 20 (02/04 0602) BP: (57-152)/(45-103) 100/74 (02/04 0602) SpO2:  [92 %-99 %] 98 % (02/04 0602) Weight:  [73.1 kg] 73.1 kg (02/04 0300) Last BM Date : 01/03/25  Weight change: Filed Weights   01/02/25 0500 01/03/25 0600 01/04/25 0300  Weight: 72.5 kg 73.6 kg 73.1 kg    Intake/Output:   Intake/Output Summary (Last 24 hours) at 01/04/2025 9071 Last data filed at 01/04/2025 0600 Gross per 24 hour  Intake 85.85 ml  Output 1000 ml  Net -914.15 ml     Physical Exam   Physical Exam: GENERAL: Fatigued appearing. Sitting up in chair. CARDIAC:  JVP flat. Mechanical heart sounds with LVAD hum present.  LUNGS:  Clear to auscultation bilaterally.  ABDOMEN:  Soft, nondistended, tender with palpation   LVAD exit site:   Dressing dry and intact.  Stabilization device present and accurately applied.   EXTREMITIES:  No edema NEUROLOGIC:  Alert and oriented x 4.  Affect flat   Telemetry   SR/ST 90s-100s  Labs   CBC Recent Labs    01/03/25 1212 01/04/25 0341  WBC 23.3* 18.4*  HGB 8.9* 7.4*  HCT 28.6* 24.5*  MCV 93.5 95.0  PLT 681* 568*   Basic  Metabolic Panel Recent Labs    97/96/73 0406 01/03/25 1452 01/04/25 0341  NA 141 135 135  K 2.8* 4.3 3.9  CL 105 98 100  CO2 24 28 27   GLUCOSE 105* 116* 114*  BUN 23 35* 33*  CREATININE 1.65* 1.25* 1.26*  CALCIUM  9.0 9.4 9.0  MG 2.3  --  2.2  PHOS 3.2  --  3.3   Liver Function Tests Recent Labs    01/03/25 0406 01/04/25 0341  ALBUMIN  3.2* 3.1*   No results for input(s): LIPASE, AMYLASE in the last 72 hours. Cardiac Enzymes No results for input(s): CKTOTAL, CKMB, CKMBINDEX, TROPONINI in the last 72 hours.  BNP: BNP (last 3  results) Recent Labs    09/01/24 1457  BNP 169.4*    ProBNP (last 3 results) Recent Labs    12/14/24 0826 12/24/24 0406 12/30/24 0348  PROBNP 1,539.0* 3,345.0* 14,579.0*     D-Dimer No results for input(s): DDIMER in the last 72 hours.  Hemoglobin A1C No results for input(s): HGBA1C in the last 72 hours. Fasting Lipid Panel No results for input(s): CHOL, HDL, LDLCALC, TRIG, CHOLHDL, LDLDIRECT in the last 72 hours. Medications:   Scheduled Medications:  sodium chloride    Intravenous Once   acetaminophen   650 mg Oral Q6H   arformoterol   15 mcg Nebulization BID   Chlorhexidine  Gluconate Cloth  6 each Topical Daily   docusate sodium   100 mg Oral Daily   gabapentin   200 mg Oral QHS   insulin  aspart  0-24 Units Subcutaneous Q4H   insulin  glargine-yfgn  24 Units Subcutaneous Daily   lactose free nutrition  237 mL Oral TID WC   lidocaine   2 patch Transdermal Q24H   mouth rinse  15 mL Mouth Rinse 4 times per day   pantoprazole  (PROTONIX ) IV  40 mg Intravenous Q12H   polyethylene glycol  17 g Oral Daily   revefenacin   175 mcg Nebulization Daily   sodium chloride  flush  10-40 mL Intracatheter Q12H   sodium chloride  flush  3 mL Intravenous Q12H   Vitamin D  (Ergocalciferol )  50,000 Units Oral Q Sun   vortioxetine  HBr  5 mg Oral Daily   Warfarin - Pharmacist Dosing Inpatient   Does not apply q1600    Infusions:  epinephrine  0.5 mcg/min (01/04/25 0137)   milrinone  0.125 mcg/kg/min (01/04/25 0351)    PRN Medications: calcium  carbonate, dextrose , fentaNYL  (SUBLIMAZE ) injection, Gerhardt's butt cream, LORazepam , ondansetron  (ZOFRAN ) IV, mouth rinse, oxyCODONE , simethicone , sodium chloride , sodium chloride  flush, sodium chloride  flush     Assessment/Plan   1. 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  - Intra Op Speed 4600 due to low filling pressures.  - Ramp echo 1/26: Speed increased from 4800 step-wise to 5100.  Flow  increased 3.0 L/min => 3.7 L/min, no ectopy.  LVIDD decreased appropriately.  At 5100 rpm, the IV septum still had some rightward shift but less than initially and the RV appeared to fill more.  - Speed increased to 5200 rpm under echo guidance on 1/31.  - Continues on 0.125 milrinone  + 0.5 epi. Co-ox 63%.  - Volume status low. No diuresis.  - CVP < 5. No diuresis. Transfusing today. - Off sildenafil  with soft MAPs. Will hold off on adding back today with low flows and GI bleeding - INR 2.4. Hold warfarin, see discussion below regarding anemia.  2.  Acute on chronic systolic CHF: Ischemic cardiomyopathy.  [employer] CRT-D device  - She is end-stage  NYHA IV.  - s/p HM-3 VAD - management as above  3. Acute hypoxic respiratory failure - reintubated 1/25, extubated 1/27.  - Oxygen saturation improved off Clevidipine  => some concern this was causing pulmonary A-V shunting.  - Completed vancomycin /meropenem  to cover for possible PNA.  - She did not have enough fluid for thoracentesis 1/30 - Now on RA  4. CAD: PCI to mid/distal LAD in 2020 and proximal LAD in 2021.   Cath this admit showed stable coronary anatomy. Suspect CP related to HF as above - Off ranolazine , imdur , plavix  - Eventually add back crestor  and zetia   5. COPD: History of smoking, quit 2021.    6. CKD 3: Follows with nephology at [facility].  - Scr stable 1.2 Now off diuretics with low volume.  7. Obesity: Has been on semaglutide. Now on hold.   8. PAD: Moderately decreased ABI on right in 7/25. Medical mgmt per Dr. Serene. Again ABI on 1/19, the R had moderate disease, and mild on the L. No claudication or other PAD symptoms.   9. Anemia: Post-op, 1 unit PRBCs 1/25. Hgb trending down last few days, 7.4 this am. Transfusing 1 u RBCs. Recheck CBC in a few hrs. May need another unit of blood. - Hemoccult positive. + melena - Holding warfarin. - GI consulted  10. FEN: TFs now off - Encourage PO intake.  11.  Abdominal pain: - Chronic issue. Reports some relief with simethicone  and tums.  - Has hx of diverticulitis with prior left hemicolectomy. - KUB with mild gaseous distension of colon. - Working up possible GI blood loss as above - Notes history of IBS.    CRITICAL CARE Performed by: COLLETTA SHAVER N   Total critical care time: 14 minutes  Critical care time was exclusive of separately billable procedures and treating other patients.  Critical care was necessary to treat or prevent imminent or life-threatening deterioration.  Critical care was time spent personally by me on the following activities: development of treatment plan with patient and/or surrogate as well as nursing, discussions with consultants, evaluation of patient's response to treatment, examination of patient, obtaining history from patient or surrogate, ordering and performing treatments and interventions, ordering and review of laboratory studies, ordering and review of radiographic studies, pulse oximetry and re-evaluation of patient's condition.    FINCH, LINDSAY N 01/04/2025 9:28 AM   Agree with above.   Remains very weak. Had episode of melena with drop in hgb and low flow on VAD. Responded to transfusion.   Still on low-dose epi and milrinone . Co-ox ok   INR 2.4  General:  Weak appearing HEENT: normal  Neck: supple. JVP not elevated.  Carotids 2+ bilat; no bruits. No lymphadenopathy or thryomegaly appreciated. Cor: LVAD hum.  Lungs: Clear. Abdomen: obese soft, nontender, non-distended. No hepatosplenomegaly. No bruits or masses. Good bowel sounds. Driveline site clean. Anchor in place.  Extremities: no cyanosis, clubbing, rash. Warm no edema  Neuro: alert & oriented x 3. No focal deficits. Moves all 4 without problem   Continue epi and milrinone  for now.   Most acute issues is GI bleeding. GI has seen. We are holding warfarin. Plan for EGD soon. Transfuse today.   Continue to mobilize.   CRITICAL  CARE Performed by: Cherrie Sieving  Total critical care time: 41 minutes  Critical care time was exclusive of separately billable procedures and treating other patients.  Critical care was necessary to treat or prevent imminent or life-threatening deterioration.  Critical care was time  spent personally by me (independent of midlevel providers or residents) on the following activities: development of treatment plan with patient and/or surrogate as well as nursing, discussions with consultants, evaluation of patient's response to treatment, examination of patient, obtaining history from patient or surrogate, ordering and performing treatments and interventions, ordering and review of laboratory studies, ordering and review of radiographic studies, pulse oximetry and re-evaluation of patient's condition.  Toribio Fuel, MD  2:29 PM

## 2025-01-04 NOTE — Consult Note (Signed)
" °  CLINICAL SUPPORT TEAM - WOUND OSTOMY AND CONTINENCE TEAM  CONSULTATION SERVICES   WOC Nurse-Inpatient Note  WOC Nurse Consult Note: Reason for Consult: buttock DTPI evolving  Wound type: pressure related, Right sacrum Pressure Injury POA: No No images noted upon admission Measurement: wide area of involvement Wound azi:dnfz epidermal sloughing is noted, linear wound to Left sacrum Drainage has desiccated appearance Periwound: dusky, dark maroon discoloration, scar tissue, due to darkly pigmented skin tone not able to determine if erythema is present. Could continue to evolve and worsen in condition, could deepen, patient has many comorbidities that may not allow for dermal healing, staff to monitor wounds and follow pressure relieving interventions, continue use of sacrum foam dressing with silicone contact layer to pad and protect..    Dressing procedure/placement/frequency:  Cleanse buttock with with Vashe solution #[id number] pat dry with gauze (do not rinse off) and apply 2 layers of Xeroform yellow gauze #[id number] over wound, cover with Sacrum Mepilex foam dressing to secure in place, change out Mepilex every 2-3 days, or PRN. Off-load heels at all times onto pillows/protective boots.       WOC will follow patient and see within 10 days.  Please reconsult if wound worsens in condition and notify provider.   Sherrilyn Hals MSN RN [facility] WOC [facility]  [phone] (Available from 7-3 pm Mon-Friday) '     "

## 2025-01-04 NOTE — Progress Notes (Signed)
 Physical Therapy Treatment Patient Details Name: Karen Warner MRN: [id number] DOB: 1951-09-06 Today's Date: 01/04/2025   History of Present Illness 74 yo F presented to hospital with anterior chest pain. She states this pain is similar to her previous CAD episodes. Underwent heart cath 12/15/2024 and now advanced heart failure and LVAD coordinators consulted. S/p HM3 LVAD placement 1/23 and extubated s/p surgery on 1/24. Re-intubated 1/25-1/27 secondary to respiratory failure. PHMx:CAD, COPD (quit smoking 2021). Ischemic CM with EF < 20%, ICD placed. CHF, chronic low back pain, CKD3, DM2, DDD-cervical, fibromyalgia, MI, peripheral neuropathy, PVD, retinopathy and glaucoma    PT Comments  Pt with increase complaints of abdominal pain on entry, reporting nothing makes it feel better. Pt reluctantly agreeable to getting up with therapy. During the course of getting RN arrived to check for wounds, and found a spot on pt sacrum. Pt stood x2 for inspection of wound and then for measurement and dressing of wound. Pt requiring min A x1 power up and min progressing to modA for maintaining face to face stand on both occasions. During sitting EoB, Scientist, Water Quality arrived. Both providing increased encouragement for ambulation. Pt reporting that she would really like to return to bed but since she was up she is willing to go for a walk. Pt requiring total A for transition from wall power to batteries and back, given distraction with pain. Pt is able to ambulate 50 feet with EVA walker and close chair follow. With return to room pt pleads not to sit up in the recliner as it is so uncomfortable. Pt agreeable to bed in fully upright chair position. Pt requiring mod A for return to bed. Pt exhibits maximal effort with therapy today despite her pain and fatigue. PT will continue to follow acutely.    If plan is discharge home, recommend the following: A little help with  bathing/dressing/bathroom;Assistance with cooking/housework;Assist for transportation;Help with stairs or ramp for entrance   Can travel by private vehicle     No   Equipment Recommendations  None recommended by PT    Recommendations for Other Services Rehab consult     Precautions / Restrictions Precautions Precautions: Fall;Sternal Recall of Precautions/Restrictions: Intact Precaution/Restrictions Comments: LVAD,  foley Restrictions Other Position/Activity Restrictions: sternal precautions     Mobility  Bed Mobility Overal bed mobility: Needs Assistance Bed Mobility: Rolling, Sidelying to Sit, Sit to Sidelying Rolling: Min assist Sidelying to sit: Mod assist     Sit to sidelying: Mod assist General bed mobility comments: min A for rolling onto side and modA for coming to EoB while maintaining sternal precautions, requires modA for returning LE to bed and with min A pt is able to roll on her back and align hips and shoulders    Transfers Overall transfer level: Needs assistance Equipment used: 1 person hand held assist (face to face, and power up to EVA) Transfers: Sit to/from Stand Sit to Stand: Min assist, +2 safety/equipment, Mod assist           General transfer comment: pt performed 3x STS from EoB with face to face assist providing support at pt elbows with her arms across her chest, standing for visualizing sacral wound and then for measurement and dressing of wound and change of wet bed linens, pt requiring min A and cuing for sequencing for coming to standing with fatigue pt needing min progressing to modA for steadying in standing, on third bout pt provided assist from side and waist to power up  to EVA walker    Ambulation/Gait Ambulation/Gait assistance: Min assist, Mod assist, +2 safety/equipment (+ chair follow) Gait Distance (Feet): 50 Feet Assistive device: Elyn Finder Gait Pattern/deviations: Step-through pattern, Decreased stride length, Trunk  flexed Gait velocity: reduced Gait velocity interpretation: <1.31 ft/sec, indicative of household ambulator   General Gait Details: min-modA for stabilization at the hips with ambulation in EVA walker, pt with increased WoB, and fatigue due to abdominal pain         Balance Overall balance assessment: Needs assistance Sitting-balance support: No upper extremity supported, Feet supported Sitting balance-Leahy Scale: Fair Sitting balance - Comments: seated EOB   Standing balance support: No upper extremity supported, During functional activity Standing balance-Leahy Scale: Zero Standing balance comment: forward posture with need for cues with standing                            Communication Communication Communication: No apparent difficulties  Cognition Arousal: Alert Behavior During Therapy: Flat affect, WFL for tasks assessed/performed   PT - Cognitive impairments: Memory, Attention, Sequencing, Safety/Judgement, Problem solving                       PT - Cognition Comments: pt preoccupied by pain, and limited ability to participate in transfer from wall power to battery power and back, Following commands: Intact      Cueing Cueing Techniques: Verbal cues, Gestural cues     General Comments General comments (skin integrity, edema, etc.): pt on 3L O2 via [place], SpO2 after ambulation 98%O2 once good pleth wave available      Pertinent Vitals/Pain Pain Assessment Pain Assessment: Faces Faces Pain Scale: Hurts whole lot Pain Location: abdomen Pain Descriptors / Indicators: Discomfort, Grimacing, Guarding Pain Intervention(s): Limited activity within patient's tolerance, Premedicated before session, Monitored during session, Utilized relaxation techniques     PT Goals (current goals can now be found in the care plan section) Acute Rehab PT Goals PT Goal Formulation: With patient Time For Goal Achievement: 01/13/25 Potential to Achieve Goals:  Good Progress towards PT goals: Progressing toward goals    Frequency    Min 2X/week       AM-PAC PT 6 Clicks Mobility   Outcome Measure  Help needed turning from your back to your side while in a flat bed without using bedrails?: Total Help needed moving from lying on your back to sitting on the side of a flat bed without using bedrails?: Total Help needed moving to and from a bed to a chair (including a wheelchair)?: Total Help needed standing up from a chair using your arms (e.g., wheelchair or bedside chair)?: Total Help needed to walk in hospital room?: Total Help needed climbing 3-5 steps with a railing? : Total 6 Click Score: 6    End of Session   Activity Tolerance: Patient tolerated treatment well;Patient limited by fatigue;Patient limited by pain Patient left: in bed;with call bell/phone within reach;with bed alarm set;with family/visitor present Nurse Communication: Mobility status PT Visit Diagnosis: Other abnormalities of gait and mobility (R26.89);Muscle weakness (generalized) (M62.81)     Time: 8984-8853 PT Time Calculation (min) (ACUTE ONLY): 91 min  Charges:    $Gait Training: 23-37 mins $Therapeutic Activity: 53-67 mins PT General Charges $$ ACUTE PT VISIT: 1 Visit                     Kamilah Correia B. Van Fleet PT, DPT Acute Rehabilitation Services Please  use secure chat or  Call Office [phone]    [name] KATHEE Salinas Fleet 01/04/2025, 1:11 PM

## 2025-01-04 NOTE — H&P (View-Only) (Signed)
 "    Daily Progress Note  DOA: 12/14/2024 Hospital Day: 22  Cc:  Reconsult : GI bleed and abdominal pain    Brief Summary: Patient is 74 yo female with ischemic cardiomyopathy s/p recent LVAD. We saw her this admission on 1/16 for abdominal pain / chest pain , refer to that consult for details. In summary, we felt pain was likely 2/2 to severe CHF and also with musculoskeletal component. She had neg US  and CT scan . Since then she has undergone LVAD placement. We have been reconsulted for GI bleed, she has ongoing generalized abdominal pain as well  Assessment   # 74 year old female with CAD, ischemic cardiomyopathy status post recent LVAD  # GI bleed with melena on warfarin # Acute on chronic anemia in setting of GI bleed  Spoke with RN at bedside.  Patient had 2 dark tarry stools yesterday with decline in  hgb from 8.9 to 7.4 overnight Received a unit of RBCs this a.m. .  Follow-up hemoglobin 9.2 posttransfusion hemoglobin   # Generalized abdominal pain / bloating.  Etiology unclear. We evaluated her for this earlier this admission, thought pain musculoskeletal and possible related to heart failure. Continues to have generalized abdominal pain. Has some nausea, appetite not great but daughter.  Non-contrast CT in January unrevealing except for gallbladder sludge but pain is diffuse no localized to suggest biliary origin  # Leukocytosis ? etiology  # Diverticular disease, s/p left hemicolectomy   # COPD  # CKD  # DM2   Plan: - Continue twice daily IV pantoprazole  - Trend H&H - Patient will need EGD, possibly tomorrow if INR is acceptable. Heart Failure Team holding Warfarin. INR 2.4 today  -Clear liquids okay today  Principal Problem:   Acute on chronic systolic heart failure (HCC) Active Problems:   Abdominal pain   Diabetes mellitus (HCC)   LVAD (left ventricular assist device) present (HCC)   Chronic respiratory failure with hypoxia (HCC)   Acute hypoxic  respiratory failure (HCC)   Fever    Recent Labs    01/03/25 0916 01/03/25 1212 01/04/25 0341  WBC 18.8* 23.3* 18.4*  HGB 8.1* 8.9* 7.4*  HCT 26.2* 28.6* 24.5*  MCV 93.6 93.5 95.0  PLT 540* 681* 568*   No results for input(s): FOLATE, VITAMINB12, FERRITIN, TIBC, IRONPCTSAT in the last 72 hours. Recent Labs    01/03/25 0406 01/03/25 1452 01/04/25 0341  NA 141 135 135  K 2.8* 4.3 3.9  CL 105 98 100  CO2 24 28 27   GLUCOSE 105* 116* 114*  BUN 23 35* 33*  CREATININE 1.65* 1.25* 1.26*  CALCIUM  9.0 9.4 9.0   Recent Labs    01/02/25 0158 01/03/25 0406 01/04/25 0341  ALBUMIN  3.0* 3.2* 3.1*     Imaging:  DG Abd Portable 1V CLINICAL DATA:  Abdominal pain.  EXAM: PORTABLE ABDOMEN - 1 VIEW  COMPARISON:  12/31/2024  FINDINGS: Mild diffuse gaseous distention of the colon similar to prior radiographs of the abdomen from 12/31/2024. No small bowel dilatation to indicate ileus or obstruction.  Enteric tube is unchanged in position terminating, likely within the distal stomach/proximal duodenum.  AICD and LEFT ventricular assist device partially visualized.  IMPRESSION: Mild diffuse gaseous distention of the colon, unchanged from prior abdominal radiograph from 12/31/2024. No small bowel dilatation to indicate ileus or obstruction.  Electronically Signed   By: Aliene Lloyd M.D.   On: 01/02/2025 13:37 DG Chest Port 1 View CLINICAL DATA:  Left ventricular assist device.  EXAM: PORTABLE CHEST 1 VIEW  COMPARISON:  01/01/2025  FINDINGS: The cardio pericardial silhouette is enlarged. No overt edema. Left base largely obscured. Lungs otherwise appear clear with interval decrease in right base atelectasis seen previously. Left ventricular assist device again noted with left-sided permanent pacemaker/AICD and battery pack for stimulator device over the right chest. Right PICC line tip overlies the SVC/RA junction.  IMPRESSION: 1. Interval decrease in  right base atelectasis. 2. Opacification of the left base previously not well demonstrated today given superimposition of support devices.  Electronically Signed   By: Camellia Candle M.D.   On: 01/02/2025 08:02     Scheduled inpatient medications:   sodium chloride    Intravenous Once   acetaminophen   650 mg Oral Q6H   arformoterol   15 mcg Nebulization BID   Chlorhexidine  Gluconate Cloth  6 each Topical Daily   docusate sodium   100 mg Oral Daily   gabapentin   200 mg Oral QHS   insulin  aspart  0-24 Units Subcutaneous Q4H   insulin  glargine-yfgn  24 Units Subcutaneous Daily   lactose free nutrition  237 mL Oral TID WC   lidocaine   2 patch Transdermal Q24H   mouth rinse  15 mL Mouth Rinse 4 times per day   pantoprazole  (PROTONIX ) IV  40 mg Intravenous Q12H   polyethylene glycol  17 g Oral Daily   revefenacin   175 mcg Nebulization Daily   sodium chloride  flush  10-40 mL Intracatheter Q12H   sodium chloride  flush  3 mL Intravenous Q12H   Vitamin D  (Ergocalciferol )  50,000 Units Oral Q Sun   vortioxetine  HBr  5 mg Oral Daily   Warfarin - Pharmacist Dosing Inpatient   Does not apply q1600   Continuous inpatient infusions:   epinephrine  0.5 mcg/min (01/04/25 0137)   milrinone  0.125 mcg/kg/min (01/04/25 0351)   PRN inpatient medications: calcium  carbonate, dextrose , fentaNYL  (SUBLIMAZE ) injection, Gerhardt's butt cream, LORazepam , ondansetron  (ZOFRAN ) IV, mouth rinse, oxyCODONE , simethicone , sodium chloride , sodium chloride  flush, sodium chloride  flush  Vital signs in last 24 hours: Temp:  [97.5 F (36.4 C)-98.9 F (37.2 C)] 98.1 F (36.7 C) (02/04 0841) Pulse Rate:  [94-104] 95 (02/04 0602) Resp:  [16-30] 19 (02/04 0930) BP: (57-112)/(45-93) 92/78 (02/04 0930) SpO2:  [92 %-99 %] 98 % (02/04 0602) Weight:  [73.1 kg] 73.1 kg (02/04 0300) Last BM Date : 01/03/25  Intake/Output Summary (Last 24 hours) at 01/04/2025 1015 Last data filed at 01/04/2025 0600 Gross per 24 hour  Intake  69.92 ml  Output 800 ml  Net -730.08 ml    Intake/Output from previous day: 02/03 0701 - 02/04 0700 In: 257 [I.V.:128.1; IV Piggyback:128.9] Out: 1125 [Urine:1125] Intake/Output this shift: No intake/output data recorded.   Physical Exam:  General: Alert female in NAD Heart:  Regular rate and rhythm. LVAD hum Pulmonary: Normal respiratory effort Abdomen: Soft, nondistended, mild generalized tenderness. Normal bowel sounds. Neurologic: Alert and oriented Psych: Pleasant. Cooperative     LOS: 20 days   Vina Dasen ,NP 01/04/2025, 10:15 AM       "

## 2025-01-04 NOTE — Progress Notes (Signed)
 Am hgb down to 7.4 Nursing reports low flow alarm with lightheadness getting up earlier that resolved quickly with sitting back down  Reviewing day notes  -already concerned about UGIB w/ tarry stools  Hgb trend 8.1 ->7,4 Had a reading of 8.9 between above w/out xfusion so question accuracy.  *FOB + stool. *PPI was already increased to BID  No other source of obvious bleeding  Plan Xfuse 1 unit for symptomatic anemia  She's on warfarin for her VAD & was held yesterday  Will need GI consult in am

## 2025-01-05 ENCOUNTER — Encounter (HOSPITAL_COMMUNITY): Payer: Self-pay | Admitting: Unknown Physician Specialty

## 2025-01-05 ENCOUNTER — Encounter (HOSPITAL_COMMUNITY): Payer: Self-pay | Admitting: Surgery

## 2025-01-05 ENCOUNTER — Encounter (HOSPITAL_COMMUNITY): Admission: EM | Payer: Self-pay | Source: Home / Self Care

## 2025-01-05 ENCOUNTER — Inpatient Hospital Stay (HOSPITAL_COMMUNITY): Admitting: Anesthesiology

## 2025-01-05 DIAGNOSIS — K5521 Angiodysplasia of colon with hemorrhage: Secondary | ICD-10-CM

## 2025-01-05 DIAGNOSIS — E1165 Type 2 diabetes mellitus with hyperglycemia: Secondary | ICD-10-CM | POA: Diagnosis not present

## 2025-01-05 DIAGNOSIS — E876 Hypokalemia: Secondary | ICD-10-CM

## 2025-01-05 DIAGNOSIS — I5023 Acute on chronic systolic (congestive) heart failure: Secondary | ICD-10-CM | POA: Diagnosis not present

## 2025-01-05 DIAGNOSIS — N1832 Chronic kidney disease, stage 3b: Secondary | ICD-10-CM | POA: Diagnosis not present

## 2025-01-05 DIAGNOSIS — N179 Acute kidney failure, unspecified: Secondary | ICD-10-CM | POA: Diagnosis not present

## 2025-01-05 DIAGNOSIS — J9601 Acute respiratory failure with hypoxia: Secondary | ICD-10-CM | POA: Diagnosis not present

## 2025-01-05 DIAGNOSIS — Z95811 Presence of heart assist device: Secondary | ICD-10-CM | POA: Diagnosis not present

## 2025-01-05 DIAGNOSIS — D72829 Elevated white blood cell count, unspecified: Secondary | ICD-10-CM | POA: Diagnosis not present

## 2025-01-05 DIAGNOSIS — K922 Gastrointestinal hemorrhage, unspecified: Secondary | ICD-10-CM

## 2025-01-05 DIAGNOSIS — D62 Acute posthemorrhagic anemia: Secondary | ICD-10-CM | POA: Diagnosis not present

## 2025-01-05 LAB — BPAM RBC
Blood Product Expiration Date: 202602272359
ISSUE DATE / TIME: 202602040501
Unit Type and Rh: 6200

## 2025-01-05 LAB — GLUCOSE, CAPILLARY
Glucose-Capillary: 103 mg/dL — ABNORMAL HIGH (ref 70–99)
Glucose-Capillary: 108 mg/dL — ABNORMAL HIGH (ref 70–99)
Glucose-Capillary: 146 mg/dL — ABNORMAL HIGH (ref 70–99)
Glucose-Capillary: 82 mg/dL (ref 70–99)
Glucose-Capillary: 89 mg/dL (ref 70–99)

## 2025-01-05 LAB — TYPE AND SCREEN
ABO/RH(D): A POS
Antibody Screen: NEGATIVE
Unit division: 0

## 2025-01-05 LAB — CBC
HCT: 29.5 % — ABNORMAL LOW (ref 36.0–46.0)
Hemoglobin: 9.3 g/dL — ABNORMAL LOW (ref 12.0–15.0)
MCH: 29.8 pg (ref 26.0–34.0)
MCHC: 31.5 g/dL (ref 30.0–36.0)
MCV: 94.6 fL (ref 80.0–100.0)
Platelets: 614 10*3/uL — ABNORMAL HIGH (ref 150–400)
RBC: 3.12 MIL/uL — ABNORMAL LOW (ref 3.87–5.11)
RDW: 16.1 % — ABNORMAL HIGH (ref 11.5–15.5)
WBC: 15.7 10*3/uL — ABNORMAL HIGH (ref 4.0–10.5)
nRBC: 0.4 % — ABNORMAL HIGH (ref 0.0–0.2)

## 2025-01-05 LAB — PROTIME-INR
INR: 2.2 — ABNORMAL HIGH (ref 0.8–1.2)
Prothrombin Time: 25.3 s — ABNORMAL HIGH (ref 11.4–15.2)

## 2025-01-05 LAB — RENAL FUNCTION PANEL
Albumin: 3.2 g/dL — ABNORMAL LOW (ref 3.5–5.0)
Anion gap: 10 (ref 5–15)
BUN: 20 mg/dL (ref 8–23)
CO2: 26 mmol/L (ref 22–32)
Calcium: 9.1 mg/dL (ref 8.9–10.3)
Chloride: 102 mmol/L (ref 98–111)
Creatinine, Ser: 1.15 mg/dL — ABNORMAL HIGH (ref 0.44–1.00)
GFR, Estimated: 50 mL/min — ABNORMAL LOW
Glucose, Bld: 84 mg/dL (ref 70–99)
Phosphorus: 3.5 mg/dL (ref 2.5–4.6)
Potassium: 3.9 mmol/L (ref 3.5–5.1)
Sodium: 138 mmol/L (ref 135–145)

## 2025-01-05 LAB — MAGNESIUM: Magnesium: 2.2 mg/dL (ref 1.7–2.4)

## 2025-01-05 LAB — COOXEMETRY PANEL
Carboxyhemoglobin: 3.2 % — ABNORMAL HIGH (ref 0.5–1.5)
Methemoglobin: <0.7 % (ref 0.0–1.5)
O2 Saturation: 76.3 %
Total hemoglobin: 9.4 g/dL — ABNORMAL LOW (ref 12.0–16.0)

## 2025-01-05 LAB — LACTATE DEHYDROGENASE: LDH: 336 U/L — ABNORMAL HIGH (ref 105–235)

## 2025-01-05 MED ORDER — SODIUM CHLORIDE 0.9 % IV SOLN: INTRAVENOUS | Status: DC

## 2025-01-05 MED ORDER — PROPOFOL 10 MG/ML IV BOLUS
INTRAVENOUS | Status: DC | PRN
  Administered 2025-01-05: 20 mg via INTRAVENOUS
  Administered 2025-01-05: 30 mg via INTRAVENOUS
  Administered 2025-01-05: 20 mg via INTRAVENOUS
  Administered 2025-01-05: 25 ug/kg/min via INTRAVENOUS
  Administered 2025-01-05: 20 mg via INTRAVENOUS
  Administered 2025-01-05: 15 mg via INTRAVENOUS
  Administered 2025-01-05: 20 mg via INTRAVENOUS

## 2025-01-05 MED ORDER — INSULIN GLARGINE-YFGN 100 UNIT/ML ~~LOC~~ SOLN
12.0000 [IU] | Freq: Every day | SUBCUTANEOUS | Status: AC
  Administered 2025-01-05 – 2025-01-06 (×2): 12 [IU] via SUBCUTANEOUS
  Filled 2025-01-05 (×3): qty 0.12

## 2025-01-05 MED ORDER — SPOT INK MARKER SYRINGE KIT
PACK | SUBMUCOSAL | Status: DC | PRN
  Administered 2025-01-05: 3.5 mL via SUBMUCOSAL

## 2025-01-05 NOTE — Progress Notes (Signed)
 "  NAME:  Karen Warner, MRN:  [id number], DOB:  [birth year], LOS: 21 ADMISSION DATE:  12/14/2024, CONSULTATION DATE:  1/23 REFERRING MD:  Lucas, CHIEF COMPLAINT:  post-LVAD   History of Present Illness:  [name] is a 74 y/o woman with a history of ICM and HFrEF who presented on 1/14 with chest pain and decompensated heart failure. She underwent left and right heart catheterization this admission showing low CO and low filling pressures.  She was managed with inotropic support. Due to progression of disease and poor tolerance of GDMT, she was evaluated for LVAD.  Intraoperatively her course was uncomplicated other than low flows on LVAD when her BP went up at the end of the case when she was stimulated. She transferred to the ICU on epi 2, NE 1, milrinone  0.353mcg. TEE with normal RV function, evidence of intrapulmonary shunt.  Paralytics not reversed in ICU. Bypass time , crossclamp time 29 min, cell saver 225cc. LVAD speed 4600 RPM. Alarms on LVAD went off during periods of hypertension post-op, controlled by afterload reduction.   Pertinent  Medical History  HFrEF due to ICM DM2 IDA FM CKD3  Significant Hospital Events: Including procedures, antibiotic start and stop dates in addition to other pertinent events   1/14 admission 1/15 L&R heart cath> nonobstructive disease, low filling pressures 1/23 LVAD 1/24 extubated; fluid boluses & milrinone  increased due to frequent LVAD alarms with high PI 1/25 reintubated for respiratory distress, worsening lactic acidosis 1/26 Tmax 100.4 on vanc and zosyn , remains intubated  1/27 extubated, remains with labile blood pressure, worsening fever/tachycardia and hypoxia requiring BiPap and resumption of inhaled nitric, cultured and broadened to vanc and meropenem , some low flows associated with hypertension. Steroids added for fevers 1/28 stopped cleviprex > improved respiratory status (pulmonary shunting) 1/29 Eating jello. Pain is  better today since she could start oral pain meds. SOB is stable.  2/1 Able to walk in the hall way 2/5 EGD today  Interim History / Subjective:  Went for EGD today- Jejunal AVM was the culprit for her recent  bleeding  Objective    Blood pressure 99/74, pulse (!) 150, temperature 98.8 F (37.1 C), temperature source Oral, resp. rate 20, height 5' 5 (1.651 m), weight 72.2 kg, SpO2 100%. CVP:  [0 mmHg-65 mmHg] 26 mmHg      Intake/Output Summary (Last 24 hours) at 01/05/2025 1502 Last data filed at 01/05/2025 1200 Gross per 24 hour  Intake 451.92 ml  Output --  Net 451.92 ml   Filed Weights   01/03/25 0600 01/04/25 0300 01/05/25 0500  Weight: 73.6 kg 73.1 kg 72.2 kg   Examination: Physical exam: General: Chronically ill-appearing F resting in bed in NAD HEENT: [place]/AT, eyes anicteric.  MMM Neuro: Alert, awake following commands, slightly uncomfortable  Chest: Coarse breath sounds, no wheezes or rhonchi LVAD hum noted. Heart: Regular rate and rhythm, no murmurs or gallops Abdomen: soft and non-tender to palpation   LVAD    Resolved problem list   Assessment and Plan    AoC HfrEF, ICM- s/p LVAD -LVAP, inotropes per HF, on milrinone  0.125 and 0.5 epi, coox 75% -amio  -sildenafil   -warfarin was held secondary to GIB, discuss resuming tomorrow post EGD today  Acute resp failure w hypoxia -improving  -IS, mobility -cont bronchodilators   Anemia UGIB  GERD AoC abd pain -Hx diverticulitis, polyps  -heme + stool 2/2 -BID PPI - Status post 1 unit blood transfusion overnight 2/4.  Hemoglobin improved from 7.4 to  9.2. 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 S/p Fulguration to  ablate the lesions to prevent bleeding by argon plasma.     A single angiodysplastic lesion with  active bleeding and fresh blood was  found in the proximal jejunum s/p fulguration to stop the bleeding by argon plasma -simethicone  added 2/3   Leukocytosis -no clear infectious source -has had about 1 wk broad abx  -abx stopped 2/2, WBC down-trending -follow off abx   AKI on CKD 3b  Hypokalemia -follow renal indices, UOP  -K is being replaced   DM2 w hyperglycemia -SSI + glargine 24 u/d   At risk for malnutrition  -encourage PO intake   Physical deconditioning  -PT/OT mobility   H/o fibromyalgia  -holding PTA trintellix  due to fever concern mobilize, optimize sleep  Full Code Coumadin  held Clear liquid diet for today  CRITICAL CARE Performed by: Leita SAUNDERS Eutha Cude   Total critical care time: 40 minutes  Critical care time was exclusive of separately billable procedures and treating other patients.  Critical care was necessary to treat or prevent imminent or life-threatening deterioration.  Critical care was time spent personally by me on the following activities: development of treatment plan with patient and/or surrogate as well as nursing, discussions with consultants, evaluation of patient's response to treatment, examination of patient, obtaining history from patient or surrogate, ordering and performing treatments and interventions, ordering and review of laboratory studies, ordering and review of radiographic studies, pulse oximetry and re-evaluation of patient's condition.   Leita SAUNDERS Linnie Delgrande, PA-C [facility] 7a-7p: For contact information, see AMION. If no response to pager, please call PCCM 2-H APP. After 7p: Please call PCCM APP on-call for 2-H.       "

## 2025-01-05 NOTE — Progress Notes (Signed)
 LVAD Coordinator Rounding Note:  Admitted 12/14/24 due to CHF exacerbation. Underwent LVAD evaluation.   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  1/23: S/P HMIII Implant.Intra-op speed placed at 4600 due to low filling pressures.   1/24: Extubated. Had some low flows. Cleveprex started. 1/25: Re-intubated with desaturation, increased work of breathing 1/26: Ramp echo, speed increased to 5100 rpm.  1 unit PRBCs. 1/27: Extubated. Increased work of breathing, back on Bipap 1/28: Stopped Clevidipine  2/2: MAPs low (60s). Given IVF back. Stopped sildenafil . Ongoing abdominal pain. KUB with dilated colon.   KUB performed 01/02/25: Mild diffuse gaseous distention of the colon, unchanged from prior abdominal radiograph from 12/31/2024. No small bowel dilatation to indicate ileus or obstruction.  VAD coordinator met pt in Endo. Hgb has drifted down with 3 dark, tarry stools on 2/3. Hemoccult +.  Hgb 9.3 - Received 1 PRBC yesterday.  Pt tells me this morning that she is feeling stronger.   Vital signs: Temp: 97 HR: 95 Doppler Pressure: 82 Automatic BP: 95/82 (87) O2 Sat: 98% on 2L/[place] Wt: 183.6>173.7>171.5>173.7>159.8>162.2>161.1>159.1 lbs    LVAD interrogation reveals:  Speed: 5200 Flow: 3.5 Power: 3.5 w PI: 7  Alarms: none in last 24 hrs Events: 2 PI events so far today  Hematocrit: 29 Fixed speed: 5200 Low speed limit: 4900   Drive Line:  CDI. Drive Tree Surgeon. Continue MWF dressing changes by VAD coordinator or nurse champion. Next dressing change due 01/06/25.     Labs:  LDH trend: [phone]  INR trend: 1.3>1.5>1.6>1.3>1.4>1.6>2>2.7>3.4>2.4  WBC trend: 18.5>23.6>21.0>15.2>17.4>15.6>19>21.3>18.8>18.4  Hgb trend: 9.2>8.5>7.6>8.9>9.5>8.7>8.1>9.6>8.1>7.4  Anticoagulation Plan: - INR Goal: 2.0 - 2.5 - ASA Dose: none - Heparin  gtt per pharmacy - Coumadin  dosing per pharmacy  Blood Products:  -  Intra-op 1/23:   2 PRBC  4 FFP  225 cc cell saver - Post op:  1/26: 1 PRBC 2/4: 1 PRBC  Device: - [employer] -Therapies: OFF  Arrythmias:   Respiratory: Extubated 1/24. Reintubated 1/25 due to respiratory distress. Extubated 1/27.   Infection:   Renal:  -BUN/CRT: 10/1.3>11/1.15>23/1.34>19/1.03>15/1>22/1.22>34/1.28>45/1.42>23/1.65>20/1.15  Adverse Events on VAD:  Drips:  Epinephrine  0.5 mcg/min Milrinone  0.125 mcg/kg/min Heparin  500 units/hr-off Amiodarone  30mg /hr-off Lasix  15 mg/hr-off  Patient Education:  Pt's daughter Luke performed dressing change yesterday at bedside with VAD coordinator assisting  Plan/Recommendations:  1. Page VAD coordinator with any drive line or equipment concerns 2. MWF drive line dressing changes per VAD coordinator or nurse champion  Lauraine Ip RN VAD Coordinator  Office: [phone]  24/7 Pager: [phone]

## 2025-01-05 NOTE — Anesthesia Postprocedure Evaluation (Signed)
"   Anesthesia Post Note  Patient: Karen Warner  Procedure(s) Performed: EGD (ESOPHAGOGASTRODUODENOSCOPY) ENTEROSCOPY EGD, WITH ARGON PLASMA COAGULATION CONTROL OF HEMORRHAGE, GI TRACT, ENDOSCOPIC, BY CLIPPING OR OVERSEWING     Patient location during evaluation: PACU Anesthesia Type: MAC Level of consciousness: awake and alert Pain management: pain level controlled Vital Signs Assessment: post-procedure vital signs reviewed and stable Respiratory status: spontaneous breathing, nonlabored ventilation, respiratory function stable and patient connected to nasal cannula oxygen Cardiovascular status: blood pressure returned to baseline Anesthetic complications: no   No notable events documented.  Last Vitals:  Vitals:   01/05/25 1020 01/05/25 1101  BP: 106/88   Pulse: 95   Resp: (!) 23   Temp:  37.1 C  SpO2: 95%     Last Pain:  Vitals:   01/05/25 1101  TempSrc: Oral  PainSc:                  Debby FORBES Like      "

## 2025-01-05 NOTE — Transfer of Care (Signed)
 Immediate Anesthesia Transfer of Care Note  Patient: Karen Warner  Procedure(s) Performed: EGD (ESOPHAGOGASTRODUODENOSCOPY) ENTEROSCOPY EGD, WITH ARGON PLASMA COAGULATION CONTROL OF HEMORRHAGE, GI TRACT, ENDOSCOPIC, BY CLIPPING OR OVERSEWING  Patient Location: PACU  Anesthesia Type:MAC  Level of Consciousness: drowsy  Airway & Oxygen Therapy: Patient connected to nasal cannula oxygen  Post-op Assessment: Report given to RN, Post -op Vital signs reviewed and stable, and Patient moving all extremities  Post vital signs: Reviewed and stable  Last Vitals:  Vitals Value Taken Time  BP 115/80 01/05/25    1000  Temp    Pulse 80 01/05/25   1000  Resp 24 01/05/25    1000  SpO2 100 01/05/25    1000    Last Pain:  Vitals:   01/05/25 0847  TempSrc: Temporal  PainSc: 0-No pain      Patients Stated Pain Goal: 3 (12/31/24 1600)  Complications: No notable events documented.

## 2025-01-05 NOTE — Interval H&P Note (Signed)
 History and Physical Interval Note: Patient here for enteroscopy today. No further bleeding reported overnight. Hgb stable. INR 2.2 - acceptable range to proceed for enteroscopy. I have discussed risks / benefits of the exam and anethesia with her - she understands she is a bit higher risk for bleeding with her INR on coumadin . She wishes to proceed, further recommendations pending the results.   01/05/2025 8:54 AM  Karen Warner Karen Warner  has presented today for surgery, with the diagnosis of melena.  The various methods of treatment have been discussed with the patient and family. After consideration of risks, benefits and other options for treatment, the patient has consented to  Procedures: EGD (ESOPHAGOGASTRODUODENOSCOPY) (N/A) as a surgical intervention.  The patient's history has been reviewed, patient examined, no change in status, stable for surgery.  I have reviewed the patient's chart and labs.  Questions were answered to the patient's satisfaction.     Elspeth P Courvoisier Hamblen

## 2025-01-05 NOTE — Anesthesia Preprocedure Evaluation (Addendum)
"                                    Anesthesia Evaluation  Patient identified by MRN, date of birth, ID band Patient awake    Reviewed: Allergy & Precautions, NPO status , Patient's Chart, lab work & pertinent test results, reviewed documented beta blocker date and time   History of Anesthesia Complications Negative for: history of anesthetic complications  Airway Mallampati: III  TM Distance: >3 FB Neck ROM: Full    Dental  (+) Dental Advisory Given   Pulmonary asthma , sleep apnea , COPD, former smoker   Pulmonary exam normal        Cardiovascular hypertension, Pt. on medications and Pt. on home beta blockers + CAD, + Past MI, + Cardiac Stents, + Peripheral Vascular Disease and +CHF  Normal cardiovascular exam+ Cardiac Defibrillator    '26 TTE - EF <20%. The left ventricle demonstrates global hypokinesis. Right ventricular systolic function is moderately reduced. Trivial mitral valve regurgitation. A small pericardial effusion is present.   S/p LVAD placement 12/23/24    Neuro/Psych  Headaches PSYCHIATRIC DISORDERS  Depression       GI/Hepatic Neg liver ROS,GERD  Medicated and Controlled,,  Endo/Other  diabetes, Type 2, Insulin  Dependent, Oral Hypoglycemic Agents   On GLP-1a   Renal/GU CRFRenal disease     Musculoskeletal  (+) Arthritis ,  Fibromyalgia -  Abdominal   Peds  Hematology  On plavix     Anesthesia Other Findings   Reproductive/Obstetrics                              Anesthesia Physical Anesthesia Plan  ASA: 4  Anesthesia Plan: MAC   Post-op Pain Management: Minimal or no pain anticipated   Induction:   PONV Risk Score and Plan: 2 and Propofol  infusion and Treatment may vary due to age or medical condition  Airway Management Planned: Nasal Cannula and Natural Airway  Additional Equipment: None  Intra-op Plan:   Post-operative Plan:   Informed Consent: I have reviewed the patients History and  Physical, chart, labs and discussed the procedure including the risks, benefits and alternatives for the proposed anesthesia with the patient or authorized representative who has indicated his/her understanding and acceptance.       Plan Discussed with: CRNA and Anesthesiologist  Anesthesia Plan Comments:          Anesthesia Quick Evaluation  "

## 2025-01-05 NOTE — Op Note (Signed)
 [facility] Patient Name: Karen Warner Procedure Date : 01/05/2025 MRN: [id number] Attending MD: Elspeth SQUIBB. Leigh , MD, [id number] Date of Birth: September 07, 1951 CSN: [id number] Age: 74 Admit Type: Inpatient Procedure:                Small bowel enteroscopy Indications:              Melena - LVAD in place on coumadin , INR 2.2 today Providers:                Elspeth SQUIBB. Leigh, MD, Fairy Marina,                            Technician, Darleene Bare, RN Referring MD:              Medicines:                Monitored Anesthesia Care Complications:            No immediate complications. Estimated blood loss:                            Minimal. Estimated Blood Loss:     Estimated blood loss was minimal. Procedure:                Pre-Anesthesia Assessment:                           - Prior to the procedure, a History and Physical                            was performed, and patient medications and                            allergies were reviewed. The patient's tolerance of                            previous anesthesia was also reviewed. The risks                            and benefits of the procedure and the sedation                            options and risks were discussed with the patient.                            All questions were answered, and informed consent                            was obtained. Prior Anticoagulants: The patient has                            taken Coumadin  (warfarin), last dose was 1 day                            prior to procedure. ASA Grade Assessment: IV - A  patient with severe systemic disease that is a                            constant threat to life. After reviewing the risks                            and benefits, the patient was deemed in                            satisfactory condition to undergo the procedure.                           After obtaining informed consent, the endoscope was                             passed under direct vision. Throughout the                            procedure, the patient's blood pressure, pulse, and                            oxygen saturations were monitored continuously. The                            [id number]-HQ190L ([id number]) Olympus colonoscope was                            introduced through the mouth, and advanced to the                            proximal jejunum. After obtaining informed consent,                            the endoscope was passed under direct vision.                            Throughout the procedure, the patient's blood                            pressure, pulse, and oxygen saturations were                            monitored continuously. The small bowel enteroscopy                            was accomplished without difficulty. The patient                            tolerated the procedure well. Scope In: Scope Out: Findings:      Esophagogastric landmarks were identified: the Z-line was found at 40       cm, the gastroesophageal junction was found at 40 cm and the upper       extent of the gastric folds was found at 40 cm from the incisors.  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      The exam of the esophagus was otherwise normal.      The entire examined stomach was normal.      A few diminutive angiodysplastic lesions with stigmata of recent       bleeding - adherent heme were found in the duodenal bulb. Fulguration to       ablate the lesions to prevent bleeding by argon plasma was successful.      The exam of the duodenum was otherwise normal.      A single angiodysplastic lesion with active bleeding and fresh blood was       found in the proximal jejunum. Fulguration to stop the bleeding by argon       plasma was successful. To prevent bleeding post-intervention, one       hemostatic clip was successfully placed across the area.      Exam of the  jejunum was otherwise normal. It was deeply intubated.      An area of the distal most aspect of the jejunum that was reached was       tattooed with an injection of Spot (carbon black). Impression:               - Esophagogastric landmarks identified.                           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                           - Normal stomach.                           - A few recently bleeding angiodysplastic lesions                            in the duodenum. Treated with argon plasma                            coagulation (APC).                           - A single bleeding angiodysplastic lesion in the                            duodenum. Treated with argon plasma coagulation                            (APC). Clip was placed.                           - Normal duodenum / jejunum otherwise - distal                            aspect of jejunum reached was tattooed.  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 Recommendation:           - Return patient to hospital ward for ongoing care.                           - Clear liquid diet.                           - Continue present medications.                           - Keep INR at lowest acceptable range for LVAD                           - Trend Hgb, monitor for recurrent bleeding                           - Protonix  40mg  PO BID Procedure Code(s):        --- Professional ---                           [phone], Small intestinal endoscopy, enteroscopy                            beyond second portion of duodenum, not including                            ileum; with control of bleeding (eg, injection,                            bipolar cautery, unipolar  cautery, laser, heater                            probe, stapler, plasma coagulator)                           44799, Unlisted procedure, small intestine Diagnosis Code(s):        --- Professional ---                           K20.90, Esophagitis, unspecified without bleeding                           K31.811, Angiodysplasia of stomach and duodenum                            with bleeding  K92.1, Melena (includes Hematochezia) CPT copyright 2022 [employer]. All rights reserved. The codes documented in this report are preliminary and upon coder review may  be revised to meet current compliance requirements. Elspeth P. Leigh, MD 01/05/2025 10:07:48 AM This report has been signed electronically. Number of Addenda: 0

## 2025-01-05 NOTE — Progress Notes (Addendum)
 PHARMACY - ANTICOAGULATION CONSULT NOTE  Pharmacy Consult for warfarin  Indication: LVAD  Allergies[1]  Patient Measurements: Height: 5' 5 (165.1 cm) Weight: 72.2 kg (159 lb 2.8 oz) IBW/kg (Calculated) : 57 HEPARIN  DW (KG): 72  Vital Signs: Temp: 97.9 F (36.6 C) (02/05 0341) Temp Source: Oral (02/05 0341) BP: 95/71 (02/05 0500) Pulse Rate: 94 (02/05 0500)  Labs: Recent Labs    01/03/25 0406 01/03/25 0916 01/03/25 1452 01/04/25 0341 01/04/25 0958 01/04/25 2116 01/05/25 0443  HGB  --    < >  --  7.4* 9.2* 9.2* 9.3*  HCT  --    < >  --  24.5* 29.4* 29.4* 29.5*  PLT  --    < >  --  568* 573* 571* 614*  LABPROT 36.1*  --   --  27.4*  --   --  25.3*  INR 3.4*  --   --  2.4*  --   --  2.2*  CREATININE 1.65*  --  1.25* 1.26*  --   --  1.15*   < > = values in this interval not displayed.    Estimated Creatinine Clearance: 42.8 mL/min (A) (by C-G formula based on SCr of 1.15 mg/dL (H)).   Medical History: Past Medical History:  Diagnosis Date   AICD (automatic cardioverter/defibrillator) present    Anemia 12/12/2019   Anginal pain    Arthralgia 01/14/2020   Arthritis    Asthma    Atherosclerotic heart disease of native coronary artery without angina pectoris 04/13/2014   CHF (congestive heart failure) (HCC)    Chronic bilateral low back pain with bilateral sciatica 01/13/2019   Chronic bladder pain 11/08/2014   Last Assessment & Plan:  Formatting of this note might be different from the original. Patient has chronic pain syndrome in general I think this is unfortunately worsening her pelvic pain and bladder pain her examination today was very reassuring I am advised her to use the estrogen cream I did start her on Elavil 10 milligrams at that time if she does tolerated will increase it to 25 milligrams.    Chronic headaches    Chronic idiopathic constipation 12/22/2014   Formatting of this note might be different from the original. Last Assessment & Plan:  For better  bowel emptying please use either Citrucel / Benefiber start with 2 tablespoons daily and titrate up or down to effect.  Also please use glycerin  suppositories as needed to assist in evacuation. Last Assessment & Plan:  Formatting of this note might be different from the original. For better bowel empt   Chronic obstructive pulmonary disease, unspecified (HCC) 03/18/2018   CKD (chronic kidney disease) stage 3, GFR 30-59 ml/min (HCC)    Class 1 obesity due to excess calories with serious comorbidity and body mass index (BMI) of 31.0 to 31.9 in adult 06/26/2020   Colon polyps    COPD (chronic obstructive pulmonary disease) (HCC)    Coronary artery disease    DDD (degenerative disc disease), cervical 01/13/2019   Depression 11/05/2019   Diabetic polyneuropathy associated with type 2 diabetes mellitus (HCC) 02/24/2019   Diverticulitis 05/20/2018   Diverticulitis of colon 03/18/2018   Family history of ischemic heart disease (IHD) 08/16/2013   Fibromyalgia    Fibromyalgia affecting multiple sites 01/14/2020   Gastro-esophageal reflux disease without esophagitis 01/13/2019   Glaucoma 11/08/2014   Hordeolum externum of left upper eyelid 03/13/2020   Hypertension 11/08/2014   IBS (irritable bowel syndrome)    Iron deficiency anemia 01/13/2019  Left renal mass 11/08/2019   Leukocytosis 05/28/2018   Low vitamin B12 level 03/13/2020   Low vitamin D  level 12/12/2019   LV dysfunction 05/31/2019   Malignant essential hypertension 01/22/2016   Migraine, unspecified, not intractable, without status migrainosus 11/08/2014   Mixed hyperlipidemia 01/13/2019   Myocardial infarction (HCC)    Other premature beats 11/08/2014   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 Last Assessment & Plan:  For vaginal atrophy please place a pea size dab of Estrogen vaginal cream  into the vagina 3 times a week ( Monday, Wednesday, Friday) Last Assessment & Plan:  Formatting of this note might be different from the original. For vaginal atrophy please place a pea size dab of Estrogen vaginal cream  into the vagina     Medications:  Scheduled:   sodium chloride    Intravenous Once   acetaminophen   650 mg Oral Q6H   arformoterol   15 mcg Nebulization BID   Chlorhexidine  Gluconate Cloth  6 each Topical Daily   docusate sodium   100 mg Oral Daily   gabapentin   200 mg Oral QHS   insulin  aspart  0-24 Units Subcutaneous Q4H   insulin  glargine-yfgn  24 Units Subcutaneous Daily   lactose free nutrition  237 mL Oral TID WC   lidocaine   2 patch Transdermal Q24H   OLANZapine   2.5 mg Oral QHS   mouth rinse  15 mL Mouth Rinse 4 times per day   pantoprazole  (PROTONIX ) IV  40 mg Intravenous Q12H   polyethylene glycol  17 g Oral Daily   revefenacin   175 mcg Nebulization Daily   sodium chloride  flush  10-40 mL Intracatheter Q12H   sodium chloride  flush  3 mL Intravenous Q12H   Vitamin D  (Ergocalciferol )  50,000 Units Oral Q Sun   vortioxetine  HBr  5 mg Oral Daily    Assessment: Patient is a 74 year old female with new LVAD placement 1/23. She was not on anticoagulation PTA. Pharmacy is consulted to dose warfarin for LVAD.   INR at goal at 2.2, LDH stable, CBC ok. Warfarin on hold for GI evaluation, will resume tomorrow with lower INR goal (1.8-2.3) given GIB.  Goal of Therapy:  INR 1.8-2.3 Monitor platelets by anticoagulation protocol: Yes   Plan:  Hold warfarin  Daily INR   Ozell Jamaica, PharmD, BCPS, [facility] Clinical Pharmacist [phone] Please check AMION for all [facility] Pharmacy numbers 01/05/2025              [1]   Allergies Allergen Reactions   Bee Venom Anaphylaxis   Sumatriptan Shortness Of Breath    Migraine worsened   Amoxicillin-Pot Clavulanate Diarrhea and Nausea And Vomiting    GI Intolerance   Oxycodone  Itching and Hives    Other reaction(s): Other (see comments) Funny feeling in head  off the edge    Buprenorphine Hcl     Other reaction(s): Itching   Clarithromycin Diarrhea    Abdominal pain   Duloxetine  Hcl Other (See Comments)    drowsiness   Hydrocodone Itching   Lactose Intolerance (Gi) Other (See Comments)    Upset stomach, gas/bloating   Liraglutide  Other (See Comments)    Abdominal discomfort   Tramadol Hcl Itching

## 2025-01-05 NOTE — Progress Notes (Addendum)
 Patient ID: [name], female   DOB: 19-Jan-1951, 74 y.o.   MRN: [id number]     Advanced Heart Failure Rounding Note  Cardiologist: Dub Huntsman, DO  [facility] Cardiologist: Dr Rolan    Patient Profile   Breya Cass is a 74 y.o. female with history of CAD and ischemic cardiomyopathy, EF <20%, CKD III and COPD admitted w/ a/c CHF, chest/abdominal pain. RHC c/w low output.   Significant events:   1/15: RHC (RA 2, PA 21/10, PCW 3,  PA sat 59%, TD CI 1.49, FICK CI 1.88, PAPi 5.5), started on milrinone            LHC patent stents, nonobstructive CAD   1/19: co-ox persistently in 40s, milrinone  increased 0.375 12/23/24: S/P HMIII Implant.Intra-op speed placed at 4700 due to low filling pressures.  Received 2UPRBC, 3 FFP, cellsaver, 1 albumin , and 4 FFP  12/23/24 POCUS: VAD well positioned. RV looked ok but PAPI low with high CVP 12/24/24: Extubated. Had some low flows. Cleveprex started 1/25: Re-intubated with desaturation, increased work of breathing 1/26: Ramp echo, speed increased to 5100 rpm.  1 unit PRBCs. 1/27: Extubated. Increased work of breathing, back on Bipap 1/28: Stopped Clevidipine  2/2: MAPs low (60s). Given IVF back. Stopped sildenafil . Ongoing abdominal pain. KUB with dilated colon. 2/4: 1 u RBCs. GI consulted.  Subjective:    POD #13  Co-ox 76% on 0.5 epi + 0.125 milrinone   CVP < 5  1 u RBCs yesterday. Hgb stable 9.3 this am.   Has already walked a lap around the unit this am.   Going for endoscopy with GI today.  HeartMate 3 VAD Equipment Check Pump Speed (RPM): 5200 RPM Pump Flow (LPM): 3.5 Power (Watts): 4 Watts Pulsatility Index: 7 Fixed Speed Limit: 5200 rpm Low Speed Limit: 4900 rpm Alarms: No alarms Auscultated: Normal expected humming Power Module Self-Test (Daily): Done System Controller Self-Test: Passed Patient Battery Source: [facility] / Wall unit Emergency Equipment at Bedside: Yes  Last low flow on 2/4  LDH 336 (downtrending  slowly) INR 2.2 (holding warfarin)  Objective:   Weight Range: 72.2 kg Body mass index is 26.49 kg/m.   Vital Signs:   Temp:  [97 F (36.1 C)-98.3 F (36.8 C)] 97.2 F (36.2 C) (02/05 1001) Pulse Rate:  [69-243] 95 (02/05 1020) Resp:  [16-34] 23 (02/05 1020) BP: (71-127)/(48-107) 106/88 (02/05 1020) SpO2:  [75 %-100 %] 95 % (02/05 1020) Weight:  [72.2 kg] 72.2 kg (02/05 0500) Last BM Date : 01/03/25  Weight change: Filed Weights   01/03/25 0600 01/04/25 0300 01/05/25 0500  Weight: 73.6 kg 73.1 kg 72.2 kg    Intake/Output:   Intake/Output Summary (Last 24 hours) at 01/05/2025 1050 Last data filed at 01/05/2025 0958 Gross per 24 hour  Intake 439.02 ml  Output 500 ml  Net -60.98 ml     Physical Exam   Physical Exam: GENERAL: Lying in bed. Fatigued appearing NECK: No JVD CARDIAC:  Mechanical heart sounds with LVAD hum present.  LUNGS:  Clear to auscultation anteriorly ABDOMEN:  Soft, round, nontender LVAD exit site:   Dressing dry and intact.  Stabilization device present and accurately applied.   EXTREMITIES:  No edema NEUROLOGIC:  Alert and oriented x 4. Affect pleasant.       Telemetry   SR/ST 90s-100s  Labs   CBC Recent Labs    01/04/25 2116 01/05/25 0443  WBC 15.2* 15.7*  HGB 9.2* 9.3*  HCT 29.4* 29.5*  MCV 93.9 94.6  PLT 571* 614*   Basic Metabolic Panel Recent Labs    97/95/73 0341 01/05/25 0443  NA 135 138  K 3.9 3.9  CL 100 102  CO2 27 26  GLUCOSE 114* 84  BUN 33* 20  CREATININE 1.26* 1.15*  CALCIUM  9.0 9.1  MG 2.2 2.2  PHOS 3.3 3.5   Liver Function Tests Recent Labs    01/04/25 0341 01/05/25 0443  ALBUMIN  3.1* 3.2*   No results for input(s): LIPASE, AMYLASE in the last 72 hours. Cardiac Enzymes No results for input(s): CKTOTAL, CKMB, CKMBINDEX, TROPONINI in the last 72 hours.  BNP: BNP (last 3 results) Recent Labs    09/01/24 1457  BNP 169.4*    ProBNP (last 3 results) Recent Labs    12/14/24 0826  12/24/24 0406 12/30/24 0348  PROBNP 1,539.0* 3,345.0* 14,579.0*     D-Dimer No results for input(s): DDIMER in the last 72 hours.  Hemoglobin A1C No results for input(s): HGBA1C in the last 72 hours. Fasting Lipid Panel No results for input(s): CHOL, HDL, LDLCALC, TRIG, CHOLHDL, LDLDIRECT in the last 72 hours. Medications:   Scheduled Medications:  sodium chloride    Intravenous Once   acetaminophen   650 mg Oral Q6H   arformoterol   15 mcg Nebulization BID   Chlorhexidine  Gluconate Cloth  6 each Topical Daily   docusate sodium   100 mg Oral Daily   gabapentin   200 mg Oral QHS   insulin  aspart  0-24 Units Subcutaneous Q4H   insulin  glargine-yfgn  12 Units Subcutaneous Daily   lactose free nutrition  237 mL Oral TID WC   lidocaine   2 patch Transdermal Q24H   OLANZapine   2.5 mg Oral QHS   mouth rinse  15 mL Mouth Rinse 4 times per day   pantoprazole  (PROTONIX ) IV  40 mg Intravenous Q12H   polyethylene glycol  17 g Oral Daily   revefenacin   175 mcg Nebulization Daily   sodium chloride  flush  10-40 mL Intracatheter Q12H   sodium chloride  flush  3 mL Intravenous Q12H   Vitamin D  (Ergocalciferol )  50,000 Units Oral Q Sun   vortioxetine  HBr  5 mg Oral Daily    Infusions:  epinephrine  0.5 mcg/min (01/05/25 0923)   milrinone  0.125 mcg/kg/min (01/05/25 0923)    PRN Medications: calcium  carbonate, dextrose , fentaNYL  (SUBLIMAZE ) injection, Gerhardt's butt cream, LORazepam , ondansetron  (ZOFRAN ) IV, mouth rinse, oxyCODONE , simethicone , sodium chloride , sodium chloride  flush, sodium chloride  flush     Assessment/Plan   1. 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  - Intra Op Speed 4600 due to low filling pressures.  - Ramp echo 1/26: Speed increased from 4800 step-wise to 5100.  Flow increased 3.0 L/min => 3.7 L/min, no ectopy.  LVIDD decreased appropriately.  At 5100 rpm, the IV septum still had some rightward shift but less than  initially and the RV appeared to fill more.  - Speed increased to 5200 rpm under echo guidance on 1/31.  - Continues on 0.125 milrinone  + 0.5 epi. Co-ox 756%. - MAPs up and down, not sure how accurate they have been (difficult to obtain). Attempt to wean off Epi today. - Volume status low. No diuresis. - Eventually add back sildenafil  for RV - INR 2.2. Hold warfarin for GI bleed, see discussion.  2.  Acute on chronic systolic CHF: Ischemic cardiomyopathy.  [employer] CRT-D device  - She is end-stage NYHA IV.  - s/p HM-3 VAD - management as above  3. Acute hypoxic respiratory  failure - reintubated 1/25, extubated 1/27.  - Oxygen saturation improved off Clevidipine  => some concern this was causing pulmonary A-V shunting.  - Completed vancomycin /meropenem  to cover for possible PNA.  - She did not have enough fluid for thoracentesis 1/30 - Now on RA  4. CAD: PCI to mid/distal LAD in 2020 and proximal LAD in 2021.   Cath this admit showed stable coronary anatomy. Suspect CP related to HF as above - Off ranolazine , imdur , plavix  - Eventually add back crestor  and zetia   5. COPD: History of smoking, quit 2021.    6. CKD 3: Follows with nephology at [facility].  - Scr stable 1.15. Now off diuretics with low volume.  7. Obesity: Has been on semaglutide. Now on hold.   8. PAD: Moderately decreased ABI on right in 7/25. Medical mgmt per Dr. Serene. Again ABI on 1/19, the R had moderate disease, and mild on the L. No claudication or other PAD symptoms.   9. Anemia: Post-op, 1 unit PRBCs 1/25. Hgb down to 7.4 on 2/4. Given 1 u RBCs. Melena noted. Hgb improved to 9s. - Holding warfarin. - GI consulted. Planning for SBE today.  - continue PPI  10. FEN: TFs now off - Encourage PO intake. - Have added zyprexa  which may help with appetite and nausea  11. Abdominal pain: - Chronic issue. No clear source on non-contrast CT in 01/26 - GI following. Appreciate assistance.  12.  Anxiety/depression - Home trintellix  added back post VAD - Zyprexa  added 2/4 (stop at discharge)   CRITICAL CARE Performed by: COLLETTA SHAVER N   Total critical care time: 12 minutes  Critical care time was exclusive of separately billable procedures and treating other patients.  Critical care was necessary to treat or prevent imminent or life-threatening deterioration.  Critical care was time spent personally by me on the following activities: development of treatment plan with patient and/or surrogate as well as nursing, discussions with consultants, evaluation of patient's response to treatment, examination of patient, obtaining history from patient or surrogate, ordering and performing treatments and interventions, ordering and review of laboratory studies, ordering and review of radiographic studies, pulse oximetry and re-evaluation of patient's condition.   FINCH, LINDSAY N 01/05/2025 10:50 AM  Agree with above.   Epi stopped this am. Remains on milrinone . Coox ok  Endoscopy today with several AVMs that were APC'. No further bleeding.   Still fatigued.  General:  NAD.  HEENT: normal  Neck: supple. JVP not elevated.  Carotids 2+ bilat; no bruits. No lymphadenopathy or thryomegaly appreciated. Cor: LVAD hum.  Lungs: Clear. Abdomen: obese soft, nontender, non-distended. No hepatosplenomegaly. No bruits or masses. Good bowel sounds. Driveline site clean. Anchor in place.  Extremities: no cyanosis, clubbing, rash. Warm no edema  Neuro: alert & oriented x 3. No focal deficits. Moves all 4 without problem   Continues to progress slowly. Epi stopped today. Continue milrinone    VAD interrogated personally. Parameters stable.  Hold warfarin tonight. (Discussed with pharmD)  Continue to ambulate. Watch Hgb.  CRITICAL CARE Performed by: Cherrie Sieving  Total critical care time: 39 minutes  Critical care time was exclusive of separately billable procedures and treating  other patients.  Critical care was necessary to treat or prevent imminent or life-threatening deterioration.  Critical care was time spent personally by me (independent of midlevel providers or residents) on the following activities: development of treatment plan with patient and/or surrogate as well as nursing, discussions with consultants, evaluation of patient's response to treatment,  examination of patient, obtaining history from patient or surrogate, ordering and performing treatments and interventions, ordering and review of laboratory studies, ordering and review of radiographic studies, pulse oximetry and re-evaluation of patient's condition.  Toribio Fuel, MD  10:49 PM

## 2025-01-05 NOTE — Progress Notes (Signed)
 VAD Coordinator Procedure Note:   VAD Coordinator met patient in Endoscopy. Pt undergoing Enteroscopy per Dr. Leigh. Hemodynamics and VAD parameters monitored by myself and anesthesia throughout the procedure. Blood pressures were obtained with automatic cuff on left arm.    Time: Doppler Auto  BP Flow PI Power Speed  Pre-procedure:  0845  105/84(92) 3.5 7 3.5 5200                    Sedation Induction: 0922  111/94 (102) 4.4 3.2 3.5 5200   0930  95/60(70) 4.1 3.4 3.5 5200   0945  113/89(96) 4.3 3.4 3.4 5200   0955  109/74(82) 4.3 3.8 3.5 5200           Recovery Area: 1003  115/61(76) 3.6 5.7 3.5 5200   1015  113/69(82) 3.6 5.6 3.5 5200    Patient tolerated the procedure well. VAD Coordinator accompanied and remained with patient in recovery area.    Patient Disposition: pt transported to [facility] and handoff given to bedside nurse.  Lauraine Ip RN, BSN VAD Coordinator 24/7 Pager [phone]

## 2025-01-05 NOTE — Progress Notes (Signed)
 EVENING ROUNDS NOTE :     [address] [place] [address]             [phone]               * Day of Surgery * Procedures (LRB): EGD (ESOPHAGOGASTRODUODENOSCOPY) (N/A) ENTEROSCOPY (N/A) EGD, WITH ARGON PLASMA COAGULATION (N/A) CONTROL OF HEMORRHAGE, GI TRACT, ENDOSCOPIC, BY CLIPPING OR OVERSEWING INJECTION, SUBMUCOSAL   Total Length of Stay:  LOS: 21 days  Events:   Endoscopy performed with treatment H/H stable    BP (!) 102/90   Pulse (!) 102   Temp (!) 97.5 F (36.4 C) (Oral)   Resp 17   Ht 5' 5 (1.651 m)   Wt 72.2 kg   SpO2 100%   BMI 26.49 kg/m   CVP:  [0 mmHg-65 mmHg] 26 mmHg      milrinone  0.125 mcg/kg/min (01/05/25 1800)    I/O last 3 completed shifts: In: 594.7 [I.V.:188.1; Blood:406.7] Out: 1000 [Urine:1000]      Latest Ref Rng & Units 01/05/2025    4:43 AM 01/04/2025    9:16 PM 01/04/2025    9:58 AM  CBC  WBC 4.0 - 10.5 K/uL 15.7  15.2  16.6   Hemoglobin 12.0 - 15.0 g/dL 9.3  9.2  9.2   Hematocrit 36.0 - 46.0 % 29.5  29.4  29.4   Platelets 150 - 400 K/uL 614  571  573        Latest Ref Rng & Units 01/05/2025    4:43 AM 01/04/2025    3:41 AM 01/03/2025    2:52 PM  BMP  Glucose 70 - 99 mg/dL 84  885  883   BUN 8 - 23 mg/dL 20  33  35   Creatinine 0.44 - 1.00 mg/dL 8.84  8.73  8.74   Sodium 135 - 145 mmol/L 138  135  135   Potassium 3.5 - 5.1 mmol/L 3.9  3.9  4.3   Chloride 98 - 111 mmol/L 102  100  98   CO2 22 - 32 mmol/L 26  27  28    Calcium  8.9 - 10.3 mg/dL 9.1  9.0  9.4     ABG    Component Value Date/Time   PHART 7.461 (H) 12/29/2024 0546   PCO2ART 31.3 (L) 12/29/2024 0546   PO2ART 99 12/29/2024 0546   HCO3 22.3 12/29/2024 0546   TCO2 23 12/29/2024 0546   ACIDBASEDEF 1.0 12/29/2024 0546   O2SAT 76.3 01/05/2025 0443       Linnie Rayas, MD 01/05/2025 6:19 PM

## 2025-01-06 LAB — CBC
HCT: 30.4 % — ABNORMAL LOW (ref 36.0–46.0)
Hemoglobin: 9.4 g/dL — ABNORMAL LOW (ref 12.0–15.0)
MCH: 29.7 pg (ref 26.0–34.0)
MCHC: 30.9 g/dL (ref 30.0–36.0)
MCV: 96.2 fL (ref 80.0–100.0)
Platelets: 567 10*3/uL — ABNORMAL HIGH (ref 150–400)
RBC: 3.16 MIL/uL — ABNORMAL LOW (ref 3.87–5.11)
RDW: 16 % — ABNORMAL HIGH (ref 11.5–15.5)
WBC: 16.3 10*3/uL — ABNORMAL HIGH (ref 4.0–10.5)
nRBC: 0.1 % (ref 0.0–0.2)

## 2025-01-06 LAB — RENAL FUNCTION PANEL
Albumin: 3.2 g/dL — ABNORMAL LOW (ref 3.5–5.0)
Anion gap: 9 (ref 5–15)
BUN: 13 mg/dL (ref 8–23)
CO2: 25 mmol/L (ref 22–32)
Calcium: 8.8 mg/dL — ABNORMAL LOW (ref 8.9–10.3)
Chloride: 102 mmol/L (ref 98–111)
Creatinine, Ser: 1.2 mg/dL — ABNORMAL HIGH (ref 0.44–1.00)
GFR, Estimated: 47 mL/min — ABNORMAL LOW
Glucose, Bld: 171 mg/dL — ABNORMAL HIGH (ref 70–99)
Phosphorus: 2.8 mg/dL (ref 2.5–4.6)
Potassium: 4.2 mmol/L (ref 3.5–5.1)
Sodium: 136 mmol/L (ref 135–145)

## 2025-01-06 LAB — GLUCOSE, CAPILLARY
Glucose-Capillary: 74 mg/dL (ref 70–99)
Glucose-Capillary: 151 mg/dL — ABNORMAL HIGH (ref 70–99)
Glucose-Capillary: 123 mg/dL — ABNORMAL HIGH (ref 70–99)
Glucose-Capillary: 152 mg/dL — ABNORMAL HIGH (ref 70–99)
Glucose-Capillary: 211 mg/dL — ABNORMAL HIGH (ref 70–99)
Glucose-Capillary: 118 mg/dL — ABNORMAL HIGH (ref 70–99)
Glucose-Capillary: 174 mg/dL — ABNORMAL HIGH (ref 70–99)
Glucose-Capillary: 157 mg/dL — ABNORMAL HIGH (ref 70–99)
Glucose-Capillary: 117 mg/dL — ABNORMAL HIGH (ref 70–99)

## 2025-01-06 LAB — LACTATE DEHYDROGENASE: LDH: 335 U/L — ABNORMAL HIGH (ref 105–235)

## 2025-01-06 LAB — PROTIME-INR
INR: 1.9 — ABNORMAL HIGH (ref 0.8–1.2)
Prothrombin Time: 22.8 s — ABNORMAL HIGH (ref 11.4–15.2)

## 2025-01-06 LAB — MAGNESIUM: Magnesium: 2.2 mg/dL (ref 1.7–2.4)

## 2025-01-06 LAB — COOXEMETRY PANEL
Carboxyhemoglobin: 2.6 % — ABNORMAL HIGH (ref 0.5–1.5)
Methemoglobin: 0.9 % (ref 0.0–1.5)
O2 Saturation: 63.6 %
Total hemoglobin: 8.1 g/dL — ABNORMAL LOW (ref 12.0–16.0)

## 2025-01-06 LAB — PRO BRAIN NATRIURETIC PEPTIDE: Pro Brain Natriuretic Peptide: 4047 pg/mL — ABNORMAL HIGH

## 2025-01-06 MED ORDER — PANTOPRAZOLE SODIUM 40 MG PO TBEC
40.0000 mg | DELAYED_RELEASE_TABLET | Freq: Two times a day (BID) | ORAL | Status: AC
  Administered 2025-01-06 (×2): 40 mg via ORAL
  Filled 2025-01-06 (×2): qty 1

## 2025-01-06 MED ORDER — PREGABALIN 75 MG PO CAPS
75.0000 mg | ORAL_CAPSULE | Freq: Every day | ORAL | Status: AC
  Administered 2025-01-06: 75 mg via ORAL
  Filled 2025-01-06: qty 1

## 2025-01-06 MED ORDER — EZETIMIBE 10 MG PO TABS: 10.0000 mg | ORAL_TABLET | Freq: Every day | ORAL | Status: AC

## 2025-01-06 MED ORDER — TRAZODONE HCL 50 MG PO TABS: 50.0000 mg | ORAL_TABLET | Freq: Every evening | ORAL | Status: AC | PRN

## 2025-01-06 MED ORDER — ROSUVASTATIN CALCIUM 20 MG PO TABS: 40.0000 mg | ORAL_TABLET | Freq: Every day | ORAL | Status: AC

## 2025-01-06 MED ORDER — WARFARIN - PHARMACIST DOSING INPATIENT: Freq: Every day | Status: AC

## 2025-01-06 MED ORDER — PREGABALIN 75 MG PO CAPS: 75.0000 mg | ORAL_CAPSULE | Freq: Two times a day (BID) | ORAL | Status: DC

## 2025-01-06 MED ORDER — WARFARIN SODIUM 1 MG PO TABS
1.0000 mg | ORAL_TABLET | Freq: Once | ORAL | Status: AC
  Administered 2025-01-06: 1 mg via ORAL
  Filled 2025-01-06: qty 1

## 2025-01-06 NOTE — Progress Notes (Signed)
 "  NAME:  Karen Warner, MRN:  [id number], DOB:  [birth year], LOS: 22 ADMISSION DATE:  12/14/2024, CONSULTATION DATE:  1/23 REFERRING MD:  Lucas, CHIEF COMPLAINT:  post-LVAD   History of Present Illness:  [name] is a 74 y/o woman with a history of ICM and HFrEF who presented on 1/14 with chest pain and decompensated heart failure. She underwent left and right heart catheterization this admission showing low CO and low filling pressures.  She was managed with inotropic support. Due to progression of disease and poor tolerance of GDMT, she was evaluated for LVAD.  Intraoperatively her course was uncomplicated other than low flows on LVAD when her BP went up at the end of the case when she was stimulated. She transferred to the ICU on epi 2, NE 1, milrinone  0.387mcg. TEE with normal RV function, evidence of intrapulmonary shunt.  Paralytics not reversed in ICU. Bypass time , crossclamp time 29 min, cell saver 225cc. LVAD speed 4600 RPM. Alarms on LVAD went off during periods of hypertension post-op, controlled by afterload reduction.   Pertinent  Medical History  HFrEF due to ICM DM2 IDA FM CKD3  Significant Hospital Events: Including procedures, antibiotic start and stop dates in addition to other pertinent events   1/14 admission 1/15 L&R heart cath> nonobstructive disease, low filling pressures 1/23 LVAD 1/24 extubated; fluid boluses & milrinone  increased due to frequent LVAD alarms with high PI 1/25 reintubated for respiratory distress, worsening lactic acidosis 1/26 Tmax 100.4 on vanc and zosyn , remains intubated  1/27 extubated, remains with labile blood pressure, worsening fever/tachycardia and hypoxia requiring BiPap and resumption of inhaled nitric, cultured and broadened to vanc and meropenem , some low flows associated with hypertension. Steroids added for fevers 1/28 stopped cleviprex > improved respiratory status (pulmonary shunting) 1/29 Eating jello. Pain is  better today since she could start oral pain meds. SOB is stable.  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 rate 19, height 5' 5 (1.651 m), weight 73.1 kg, SpO2 97%. CVP:  [0 mmHg-57 mmHg] 6 mmHg      Intake/Output Summary (Last 24 hours) at 01/06/2025 9078 Last data filed at 01/06/2025 0700 Gross per 24 hour  Intake 369.48 ml  Output --  Net 369.48 ml   Filed Weights   01/04/25 0300 01/05/25 0500 01/06/25 0500  Weight: 73.1 kg 72.2 kg 73.1 kg   Examination: No distress LVAD hum Ext warm Moves to command RASS 0 Minimal edema  HeartMate 2 VAD Equipment Check Pump Speed (RPM): 4600 RPM HeartMate 3 VAD Equipment Check Pump Speed (RPM): 5200 RPM Pump Flow (LPM): 3.0 Power (Watts): 4 Watts Pulsatility Index: 8.8 Fixed Speed Limit: 5200 rpm Low Speed Limit: 4900 rpm Alarms: No alarms Auscultated: Normal expected humming Power Module Self-Test (Daily): Done System Controller Self-Test: Passed Patient Battery Source: [facility] / Wall unit Emergency Equipment at Bedside: Yes  LDH, leukocytosis, thrombocytosis stable  Resolved problem list   Assessment and Plan    AoC HfrEF, ICM- s/p LVAD- some RV cardioplegia post Acute resp failure w hypoxia 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

## 2025-01-06 NOTE — Progress Notes (Signed)
 OT Cancellation Note  Patient Details Name: Karen Warner MRN: [id number] DOB: 05/20/1951   Cancelled Treatment:    Reason Eval/Treat Not Completed: Other (comment) (Pt walked with nsg and fatigued. Will return at a later time as schedule allows.)  [facility] 01/06/2025, 1:40 PM Kreg Sink, OT/L   Acute OT Clinical Specialist Acute Rehabilitation Services Pager [phone] Office [phone]

## 2025-01-06 NOTE — Progress Notes (Signed)
 Nutrition Follow-up  DOCUMENTATION CODES:   Not applicable  INTERVENTION:   Continue GI Soft (easy to digest) -Encourage small frequent meals -Encourage protein intake; discussed good sources of protein  Continue Boost Plus po TID, each supplement provides 360 kcal and 14 grams of protein  NUTRITION DIAGNOSIS:   Increased nutrient needs related to chronic illness as evidenced by estimated needs.  Remains applicable  GOAL:   Patient will meet greater than or equal to 90% of their needs  Progressing  MONITOR:   Diet advancement, PO intake, TF tolerance, Labs, Weight trends, I & O's  REASON FOR ASSESSMENT:   Consult LVAD Eval  ASSESSMENT:   74 y.o. female present to the ED with chest pain. PMH includes GERD, CHF, T2DM, HLD, CAD, COPD, HTN, Glaucoma, diverticulitis, s/p pacemaker, [place] stimulator, fibromyalgia, and CKD III. Pt admitted with chest pain, acute on chronic HF, and AKI on CKD.  1/15 Admitted  1/23 HM3 LVAD implantation 1/24 Extubated, Cleviprex  started 1/25 Re-Intubated, pressors, trickle TF started 1/26 Remains on trickle TF 1/27 Extubated, later on BiPAP, iNO restarted 1/28 Cortrak placed, trickle TF initiated 1/29 TF increased to 30 ml/hr 1/30 TF increased to 40 ml/hr 2/02 Cortrak removed, abd xray with mild diffuse gaseous distention of colon (no ileus or obstruction) 2/05 s/p EGD, jejunal AVM s/p clip  Pt off pressors, will transfer out of ICU when bed available. S/p EGD and AVM clip, no bleeding reported today. Pt eager to eat, has an appetite but was not a fan of clear liquids. GI cleared pt for advancement to soft diet. Pt happy about advancement. Encouraged working on appetite and intake. Encouraged intake of Boost plus as pt is lactose intolerant.  Will continue to monitor intake.   Pt at risk for future AVMs given hx, continue to monitor tolerance of PO intake.   No bowel movements in last few days, may need escalation of scheduled  colace and miralax  if pt does not have bowel movement soon.  Medications:  Colace daily SSI 0-24 units q4h Insulin  Glargine 12 units daily Protonix  Miralax  daily Vitamin D  50,000 units weekly Milrinone    Labs: CBGs 74-211 Cr 1.20 Hgb 9.4  Diet Order:   Diet Order             DIET SOFT Room service appropriate? Yes; Fluid consistency: Thin  Diet effective now                   EDUCATION NEEDS:   Education needs have been addressed  Skin:  Skin Assessment: Skin Integrity Issues: Skin Integrity Issues:: DTI DTI: sacrum Incisions: Driveline- new LVAD on 12/23/24  Last BM:  2/3  Height:   Ht Readings from Last 1 Encounters:  01/03/25 5' 5 (1.651 m)    Weight:   Wt Readings from Last 1 Encounters:  01/06/25 73.1 kg    Ideal Body Weight:  56.8 kg  BMI:  Body mass index is 26.82 kg/m.  Estimated Nutritional Needs:   Kcal:  1800-2000  Protein:  90-115 g  Fluid:  1.8 L   Karen Glance, MS, RDN, LDN Clinical Dietitian I Please reach out via secure chat

## 2025-01-06 NOTE — Progress Notes (Signed)
 PHARMACY - ANTICOAGULATION CONSULT NOTE  Pharmacy Consult for warfarin  Indication: LVAD  Allergies[1]  Patient Measurements: Height: 5' 5 (165.1 cm) Weight: 73.1 kg (161 lb 2.5 oz) IBW/kg (Calculated) : 57 HEPARIN  DW (KG): 72  Vital Signs: Temp: 98.4 F (36.9 C) (02/06 0402) Temp Source: Oral (02/06 0402) BP: 112/75 (02/06 0600) Pulse Rate: 112 (02/06 0700)  Labs: Recent Labs    01/04/25 0341 01/04/25 0958 01/04/25 2116 01/05/25 0443 01/06/25 0500 01/06/25 0502  HGB 7.4*   < > 9.2* 9.3*  --  9.4*  HCT 24.5*   < > 29.4* 29.5*  --  30.4*  PLT 568*   < > 571* 614*  --  567*  LABPROT 27.4*  --   --  25.3*  --  22.8*  INR 2.4*  --   --  2.2*  --  1.9*  CREATININE 1.26*  --   --  1.15* 1.20*  --    < > = values in this interval not displayed.    Estimated Creatinine Clearance: 41.2 mL/min (A) (by C-G formula based on SCr of 1.2 mg/dL (H)).   Medical History: Past Medical History:  Diagnosis Date   AICD (automatic cardioverter/defibrillator) present    Anemia 12/12/2019   Anginal pain    Arthralgia 01/14/2020   Arthritis    Asthma    Atherosclerotic heart disease of native coronary artery without angina pectoris 04/13/2014   CHF (congestive heart failure) (HCC)    Chronic bilateral low back pain with bilateral sciatica 01/13/2019   Chronic bladder pain 11/08/2014   Last Assessment & Plan:  Formatting of this note might be different from the original. Patient has chronic pain syndrome in general I think this is unfortunately worsening her pelvic pain and bladder pain her examination today was very reassuring I am advised her to use the estrogen cream I did start her on Elavil 10 milligrams at that time if she does tolerated will increase it to 25 milligrams.    Chronic headaches    Chronic idiopathic constipation 12/22/2014   Formatting of this note might be different from the original. Last Assessment & Plan:  For better bowel emptying please use either Citrucel /  Benefiber start with 2 tablespoons daily and titrate up or down to effect.  Also please use glycerin  suppositories as needed to assist in evacuation. Last Assessment & Plan:  Formatting of this note might be different from the original. For better bowel empt   Chronic obstructive pulmonary disease, unspecified (HCC) 03/18/2018   CKD (chronic kidney disease) stage 3, GFR 30-59 ml/min (HCC)    Class 1 obesity due to excess calories with serious comorbidity and body mass index (BMI) of 31.0 to 31.9 in adult 06/26/2020   Colon polyps    COPD (chronic obstructive pulmonary disease) (HCC)    Coronary artery disease    DDD (degenerative disc disease), cervical 01/13/2019   Depression 11/05/2019   Diabetic polyneuropathy associated with type 2 diabetes mellitus (HCC) 02/24/2019   Diverticulitis 05/20/2018   Diverticulitis of colon 03/18/2018   Family history of ischemic heart disease (IHD) 08/16/2013   Fibromyalgia    Fibromyalgia affecting multiple sites 01/14/2020   Gastro-esophageal reflux disease without esophagitis 01/13/2019   Glaucoma 11/08/2014   Hordeolum externum of left upper eyelid 03/13/2020   Hypertension 11/08/2014   IBS (irritable bowel syndrome)    Iron deficiency anemia 01/13/2019   Left renal mass 11/08/2019   Leukocytosis 05/28/2018   Low vitamin B12 level 03/13/2020  Low vitamin D  level 12/12/2019   LV dysfunction 05/31/2019   Malignant essential hypertension 01/22/2016   Migraine, unspecified, not intractable, without status migrainosus 11/08/2014   Mixed hyperlipidemia 01/13/2019   Myocardial infarction [facility])    Other premature beats 11/08/2014   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 Last Assessment & Plan:  For vaginal atrophy please place a pea size dab of Estrogen vaginal cream  into the vagina 3 times a week ( Monday, Wednesday, Friday) Last Assessment & Plan:  Formatting of this note might be different from the original. For vaginal atrophy please place a pea size dab of Estrogen vaginal cream  into the vagina     Medications:  Scheduled:   sodium chloride    Intravenous Once   acetaminophen   650 mg Oral Q6H   arformoterol   15 mcg Nebulization BID   Chlorhexidine  Gluconate Cloth  6 each Topical Daily   docusate sodium   100 mg Oral Daily   gabapentin   200 mg Oral QHS   insulin  aspart  0-24 Units Subcutaneous Q4H   insulin  glargine-yfgn  12 Units Subcutaneous Daily   lactose free nutrition  237 mL Oral TID WC   lidocaine   2 patch Transdermal Q24H   OLANZapine   2.5 mg Oral QHS   mouth rinse  15 mL Mouth Rinse 4 times per day   pantoprazole   40 mg Oral BID   polyethylene glycol  17 g Oral Daily   revefenacin   175 mcg Nebulization Daily   sodium chloride  flush  10-40 mL Intracatheter Q12H   sodium chloride  flush  3 mL Intravenous Q12H   Vitamin D  (Ergocalciferol )  50,000 Units Oral Q Sun   vortioxetine  HBr  5 mg Oral Daily    Assessment: Patient is a 74 year old female with new LVAD placement 1/23. She was not on anticoagulation PTA. Pharmacy is consulted to dose warfarin for LVAD. Pt with dark stools 2/2 s/p EGD 2/5 with APC and clips.  INR at goal at 1.9, LDH stable, CBC ok. INR goal lowered to 1.8-2.3 with GI bleeding.   Goal of Therapy:  INR 1.8-2.3 Monitor platelets by anticoagulation protocol: Yes   Plan:  Warfarin 1mg  x1 tonight Daily INR, CBC, LDH   Karen Warner, PharmD, BCPS, [facility] Clinical Pharmacist [phone] Please check AMION for all [facility] Pharmacy numbers 01/06/2025               [1]  Allergies Allergen Reactions   Bee Venom  Anaphylaxis   Sumatriptan Shortness Of Breath    Migraine worsened   Amoxicillin-Pot Clavulanate Diarrhea and Nausea And Vomiting    GI Intolerance   Oxycodone  Itching and Hives    Other reaction(s): Other (see comments) Funny feeling in head  off the edge    Buprenorphine Hcl     Other reaction(s): Itching   Clarithromycin Diarrhea    Abdominal pain   Duloxetine  Hcl Other (See Comments)    drowsiness   Hydrocodone Itching   Lactose Intolerance (Gi) Other (See Comments)    Upset stomach, gas/bloating   Liraglutide Other (See Comments)    Abdominal discomfort   Tramadol  Hcl Itching

## 2025-01-06 NOTE — Progress Notes (Signed)
 This chaplain attempted F/U spiritual care. The Pt. is sleeping at the time of the visit. This chaplain will plan a time to revisit.  Chaplain Leeroy Hummer [phone]

## 2025-01-06 NOTE — Progress Notes (Addendum)
 Patient ID: Karen Warner, female   DOB: [birth year], 74 y.o.   MRN: [id number]     Advanced Heart Failure Rounding Note  Cardiologist: Dub Huntsman, DO  [facility] Cardiologist: Dr Rolan   Patient Profile   Karen Warner is a 74 y.o. female with history of CAD and ischemic cardiomyopathy, EF <20%, CKD III and COPD admitted w/ a/c CHF, chest/abdominal pain. RHC c/w low output.   Significant events:   1/15: RHC (RA 2, PA 21/10, PCW 3,  PA sat 59%, TD CI 1.49, FICK CI 1.88, PAPi 5.5), started on milrinone            LHC patent stents, nonobstructive CAD   1/19: co-ox persistently in 40s, milrinone  increased 0.375 12/23/24: S/P HMIII Implant.Intra-op speed placed at 4700 due to low filling pressures.  Received 2UPRBC, 3 FFP, cellsaver, 1 albumin , and 4 FFP  12/23/24 POCUS: VAD well positioned. RV looked ok but PAPI low with high CVP 12/24/24: Extubated. Had some low flows. Cleveprex started 1/25: Re-intubated with desaturation, increased work of breathing 1/26: Ramp echo, speed increased to 5100 rpm.  1 unit PRBCs. 1/27: Extubated. Increased work of breathing, back on Bipap 1/28: Stopped Clevidipine  2/2: MAPs low (60s). Given IVF back. Stopped sildenafil . Ongoing abdominal pain. KUB with dilated colon. 2/4: 1 u RBCs. GI consulted. 2/5: s/p EGD, several AVMs treated w/ APC. Epi discontinued   Subjective:    POD #14  Remains on Milrinone  0.125. Co-ox 64%. CVP 5-6   Hgb remains stable at 9.4. No BM since EGD.   MAP 80.   OOB, sitting up in chair. Feels tired/fatigued. Doesn't like the clear liquid diet. Asking for oatmeal    HeartMate 3 VAD Equipment Check Pump Speed (RPM): 5200 RPM Pump Flow (LPM): 3.0 Power (Watts): 4 Watts Pulsatility Index: 8.8 Fixed Speed Limit: 5200 rpm Low Speed Limit: 4900 rpm Alarms: No alarms Auscultated: Normal expected humming Power Module Self-Test (Daily): Done System Controller Self-Test: Passed Patient Battery Source: [facility] / Wall  unit Emergency Equipment at Bedside: Yes  Last low flow on 2/4  LDH 335 (downtrending slowly) INR 1.9 (holding warfarin)  Objective:   Weight Range: 73.1 kg Body mass index is 26.82 kg/m.   Vital Signs:   Temp:  [97 F (36.1 C)-98.8 F (37.1 C)] 98.4 F (36.9 C) (02/06 0402) Pulse Rate:  [87-150] 112 (02/06 0700) Resp:  [14-35] 18 (02/06 0700) BP: (87-127)/(54-107) 112/75 (02/06 0600) SpO2:  [90 %-100 %] 97 % (02/06 0700) Weight:  [73.1 kg] 73.1 kg (02/06 0500) Last BM Date : 01/03/25  Weight change: Filed Weights   01/04/25 0300 01/05/25 0500 01/06/25 0500  Weight: 73.1 kg 72.2 kg 73.1 kg    Intake/Output:   Intake/Output Summary (Last 24 hours) at 01/06/2025 0757 Last data filed at 01/06/2025 0700 Gross per 24 hour  Intake 376.94 ml  Output --  Net 376.94 ml     Physical Exam   CVP 5-6  GENERAL: fatigued appearing, no acute distress. HEENT: normal  NECK: Supple JVD not elevated.  CARDIAC:  Mechanical heart sounds with LVAD hum present.  LUNGS:  Clear to auscultation bilaterally.  ABDOMEN:  Soft, round, nontender, positive bowel sounds x4.     LVAD exit site:   Dressing dry and intact.  No erythema or drainage.  Stabilization device present and accurately applied.  EXTREMITIES:  Warm and dry, no cyanosis, clubbing, rash or edema  NEUROLOGIC:  Alert and oriented x 4.  Gait steady.  No  aphasia.  No dysarthria.  Affect pleasant.      Telemetry   NSR 90s w/ occasional PVCs, personally reviewed   Labs   CBC Recent Labs    01/05/25 0443 01/06/25 0502  WBC 15.7* 16.3*  HGB 9.3* 9.4*  HCT 29.5* 30.4*  MCV 94.6 96.2  PLT 614* 567*   Basic Metabolic Panel Recent Labs    97/94/73 0443 01/06/25 0500 01/06/25 0502  NA 138 136  --   K 3.9 4.2  --   CL 102 102  --   CO2 26 25  --   GLUCOSE 84 171*  --   BUN 20 13  --   CREATININE 1.15* 1.20*  --   CALCIUM  9.1 8.8*  --   MG 2.2  --  2.2  PHOS 3.5 2.8  --    Liver Function Tests Recent Labs     01/05/25 0443 01/06/25 0500  ALBUMIN  3.2* 3.2*   No results for input(s): LIPASE, AMYLASE in the last 72 hours. Cardiac Enzymes No results for input(s): CKTOTAL, CKMB, CKMBINDEX, TROPONINI in the last 72 hours.  BNP: BNP (last 3 results) Recent Labs    09/01/24 1457  BNP 169.4*    ProBNP (last 3 results) Recent Labs    12/24/24 0406 12/30/24 0348 01/06/25 0502  PROBNP 3,345.0* 14,579.0* 4,047.0*     D-Dimer No results for input(s): DDIMER in the last 72 hours.  Hemoglobin A1C No results for input(s): HGBA1C in the last 72 hours. Fasting Lipid Panel No results for input(s): CHOL, HDL, LDLCALC, TRIG, CHOLHDL, LDLDIRECT in the last 72 hours. Medications:   Scheduled Medications:  sodium chloride    Intravenous Once   acetaminophen   650 mg Oral Q6H   arformoterol   15 mcg Nebulization BID   Chlorhexidine  Gluconate Cloth  6 each Topical Daily   docusate sodium   100 mg Oral Daily   gabapentin   200 mg Oral QHS   insulin  aspart  0-24 Units Subcutaneous Q4H   insulin  glargine-yfgn  12 Units Subcutaneous Daily   lactose free nutrition  237 mL Oral TID WC   lidocaine   2 patch Transdermal Q24H   OLANZapine   2.5 mg Oral QHS   mouth rinse  15 mL Mouth Rinse 4 times per day   pantoprazole  (PROTONIX ) IV  40 mg Intravenous Q12H   polyethylene glycol  17 g Oral Daily   revefenacin   175 mcg Nebulization Daily   sodium chloride  flush  10-40 mL Intracatheter Q12H   sodium chloride  flush  3 mL Intravenous Q12H   Vitamin D  (Ergocalciferol )  50,000 Units Oral Q Sun   vortioxetine  HBr  5 mg Oral Daily    Infusions:  milrinone  0.125 mcg/kg/min (01/06/25 0700)    PRN Medications: calcium  carbonate, dextrose , fentaNYL  (SUBLIMAZE ) injection, Gerhardt's butt cream, LORazepam , ondansetron  (ZOFRAN ) IV, mouth rinse, oxyCODONE , simethicone , sodium chloride , sodium chloride  flush, sodium chloride  flush     Assessment/Plan   1. 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  - Intra Op Speed 4600 due to low filling pressures.  - Ramp echo 1/26: Speed increased from 4800 step-wise to 5100.  Flow increased 3.0 L/min => 3.7 L/min, no ectopy.  LVIDD decreased appropriately.  At 5100 rpm, the IV septum still had some rightward shift but less than initially and the RV appeared to fill more.  - Speed increased to 5200 rpm under echo guidance on 1/31.  - Continues on 0.125 milrinone , off Epi. Co-ox 64%.   -  CVP 5. No diuretics today.  - MAP 80s  - Eventually add back sildenafil  for RV - INR 1.9. Plan to resume coumadin  tonight   2.  Acute on chronic systolic CHF: Ischemic cardiomyopathy.  [employer] CRT-D device  - She is end-stage NYHA IV.  - s/p HM-3 VAD - management as above  3. Acute hypoxic respiratory failure - reintubated 1/25, extubated 1/27.  - Oxygen saturation improved off Clevidipine  => some concern this was causing pulmonary A-V shunting.  - Completed vancomycin /meropenem  to cover for possible PNA.  - She did not have enough fluid for thoracentesis 1/30 - Now on RA  4. CAD: PCI to mid/distal LAD in 2020 and proximal LAD in 2021.   Cath this admit showed stable coronary anatomy. Suspect CP related to HF as above - Off ranolazine , imdur , plavix  - Eventually add back crestor  and zetia   5. COPD: History of smoking, quit 2021.    6. CKD 3: Follows with nephology at [facility].  - Scr stable 1.2. Now off diuretics with low volume.  7. Obesity: Has been on semaglutide. Now on hold.   8. PAD: Moderately decreased ABI on right in 7/25. Medical mgmt per Dr. Serene. Again ABI on 1/19, the R had moderate disease, and mild on the L. No claudication or other PAD symptoms.   9. Anemia: Post-op, 1 unit PRBCs 1/25. Hgb down to 7.4 on 2/4. Given 1 u RBCs. Melena noted. Hgb improved to 9s. - Holding warfarin. - EGD 2/5 showed several AVMs treated w/ APC - Hgb stable today at 9.4 - continue PPI - monitor H/H  w/ restart of a/c   10. FEN: TFs now off - Encourage PO intake. - Have added zyprexa  which may help with appetite and nausea  11. Abdominal pain: - Chronic issue. No clear source on non-contrast CT in 01/26 - GI following. Appreciate assistance.  12. Anxiety/depression - Home trintellix  added back post VAD - Zyprexa  added 2/4 (stop at discharge)   Karen Shed, PA-C  01/06/2025 7:57 AM  Patient seen and examined with the above-signed Advanced Practice Provider and/or Housestaff. I personally reviewed laboratory data, imaging studies and relevant notes. I independently examined the patient and formulated the important aspects of the plan. I have edited the note to reflect any of my changes or salient points. I have personally discussed the plan with the patient and/or family.  Remains on milrinone  0.25. Co-ox ok. Off epi.   Hgb stable  Feels tired   General:  NAD.  HEENT: normal  Neck: supple. JVP not elevated.  Carotids 2+ bilat; no bruits. No lymphadenopathy or thryomegaly appreciated. Cor: LVAD hum.  Lungs: Clear. Abdomen: obese soft, nontender, non-distended. No hepatosplenomegaly. No bruits or masses. Good bowel sounds. Driveline site clean. Anchor in place.  Extremities: no cyanosis, clubbing, rash. Warm no edema  Neuro: alert & oriented x 3. No focal deficits. Moves all 4 without problem   She is making slow progress. No evidence of ongoing GI bleed. Co-ox ok.   INR 1.9. Discussed warfarin dosing with PharmD personally.  VAD interrogated personally. Parameters stable.  Stressed need to continue to ambulate  Toribio Fuel, MD  4:33 PM

## 2025-01-06 NOTE — Progress Notes (Signed)
 H&V Care Navigation CSW Progress Note  Outpatient Heart Failure CSW continuing to follow during LVAD implant admission- no CSW needs identified at this time.  Andriette HILARIO Leech, LCSW Clinical Social Worker Advanced Heart Failure Clinic Desk#: [phone] Cell#: [phone]

## 2025-01-06 NOTE — Progress Notes (Signed)
 OT Cancellation Note  Patient Details Name: Karen Warner MRN: [id number] DOB: 27-Sep-1951   Cancelled Treatment:    Reason Eval/Treat Not Completed: Other (comment) (attempted again this pm however pt sleeping and family requesting to let her sleep. Discussed possibly clustering care to decrease interruptions at night in order to increase participation with therapy during daytime hours.)  Vinnie Bobst,HILLARY 01/06/2025, 6:25 PM Kreg Sink, OT/L   Acute OT Clinical Specialist Acute Rehabilitation Services Pager [phone] Office [phone]

## 2025-01-06 NOTE — Progress Notes (Signed)
 LVAD Coordinator Rounding Note:  Admitted 12/14/24 due to CHF exacerbation. Underwent LVAD evaluation.   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  1/23: S/P HMIII Implant.Intra-op speed placed at 4600 due to low filling pressures.   1/24: Extubated. Had some low flows. Cleveprex started. 1/25: Re-intubated with desaturation, increased work of breathing 1/26: Ramp echo, speed increased to 5100 rpm.  1 unit PRBCs. 1/27: Extubated. Increased work of breathing, back on Bipap 1/28: Stopped Clevidipine  2/2: MAPs low (60s). Given IVF back. Stopped sildenafil . Ongoing abdominal pain. KUB with dilated colon.  2/4: 1 u RBCs. GI consulted. 2/5: s/p EGD, several AVMs treated w/ APC. Epi discontinued   Pt lying in bed this morning. She tells me that she is hungry and wants to eat. Denies any BM yet since procedure yesterday. Epi turned off yesterday. Pt has been up and walked this morning.  Vital signs: Temp: 98.4 HR: 99 Doppler Pressure: 94 Automatic BP: 84/74 (80) O2 Sat: 97% on 1L/Roy Wt: 183.6>173.7>171.5>173.7>159.8>162.2>161.1>159.1>161.1 lbs    LVAD interrogation reveals:  Speed: 5200 Flow: 3.1 Power: 3.5 w PI: 7.6  Alarms: none in last 24 hrs Events: 1 PI events so far today; 10 yesterday  Hematocrit: 30 Fixed speed: 5200 Low speed limit: 4900   Drive Line: Existing VAD dressing removed and site care performed using sterile technique by daughter Jon with VAD coordinator supervision. Drive line exit site cleaned with Chlora prep applicators x 2, allowed to dry, and gauze dressing with Silverlon patch applied. Exit site healing and unincorporated, the velour is fully implanted at exit site. Small amount of serous drainage. No redness, tenderness, foul odor or rash noted. Drive line anchor re-applied. Continue MWF dressing changes by VAD coordinator or nurse champion. Next dressing change due 01/09/25.        Labs:  LDH trend:  464>529>535>539>612>672>640>529>499>330>335  INR trend: 1.3>1.5>1.6>1.3>1.4>1.6>2>2.7>3.4>2.4>1.9  WBC trend: 18.5>23.6>21.0>15.2>17.4>15.6>19>21.3>18.8>18.4>16.3  Hgb trend: 9.2>8.5>7.6>8.9>9.5>8.7>8.1>9.6>8.1>7.4>9.4  Anticoagulation Plan: - INR Goal: 1.8-2.3 - ASA Dose: none - Heparin  gtt per pharmacy - Coumadin  dosing per pharmacy  Blood Products:  - Intra-op 1/23:   2 PRBC  4 FFP  225 cc cell saver - Post op:  1/26: 1 PRBC 2/4: 1 PRBC  Device: - [employer] -Therapies: OFF  Arrythmias:   Respiratory: Extubated 1/24. Reintubated 1/25 due to respiratory distress. Extubated 1/27.   Infection:   Renal:  -BUN/CRT: 10/1.3>11/1.15>23/1.34>19/1.03>15/1>22/1.22>34/1.28>45/1.42>23/1.65>20/1.15>13/1.2  Adverse Events on VAD:  Drips:  Milrinone  0.125 mcg/kg/min   Patient Education:  Pt's daughter Luke performed dressing change at bedside with VAD coordinator assisting Pts daughter Jon checked off on dressing change today. Education Binder delivered to bedside.  Plan/Recommendations:  1. Page VAD coordinator with any drive line or equipment concerns 2. MWF drive line dressing changes per VAD coordinator or nurse champion or daughter Jon Lauraine Ip RN VAD Coordinator  Office: [phone]  24/7 Pager: [phone]

## 2025-01-06 NOTE — Progress Notes (Signed)
 PT Cancellation Note  Patient Details Name: Karen Warner MRN: [id number] DOB: 1951-04-26   Cancelled Treatment:    Reason Eval/Treat Not Completed: (P) Other (comment) Pt has already walked around the unit this morning. Respectfully request PT/OT to come back this afternoon for another walk. PT/OT will follow back then.  Gabrial Domine B. Fleeta Lapidus PT, DPT Acute Rehabilitation Services Please use secure chat or  Call Office [phone]    [name] KATHEE Fleeta [facility] 01/06/2025, 9:44 AM

## 2025-01-06 NOTE — Progress Notes (Signed)
 PT Cancellation Note  Patient Details Name: Karen Warner MRN: [id number] DOB: 11-Aug-1951   Cancelled Treatment:    Reason Eval/Treat Not Completed: Other (comment) Pt just walked with nursing and wants a nap. PT will follow back later for LVAD training.   Daryn Hicks B. Fleeta Lapidus PT, DPT Acute Rehabilitation Services Please use secure chat or  Call Office [phone]    [name] [name] 01/06/2025, 1:52 PM

## 2025-01-06 NOTE — Progress Notes (Signed)
" ° ° ° °   Progress Note   Subjective  Patient denies complaints this AM, wants to eat. No further blood per rectum. Hgb stable.    Objective   Vital signs in last 24 hours: Temp:  [97.2 F (36.2 C)-98.8 F (37.1 C)] 98.4 F (36.9 C) (02/06 0402) Pulse Rate:  [87-150] 105 (02/06 0930) Resp:  [14-35] 27 (02/06 0930) BP: (84-125)/(54-106) 91/74 (02/06 0930) SpO2:  [90 %-100 %] 97 % (02/06 0930) Weight:  [73.1 kg] 73.1 kg (02/06 0500) Last BM Date : 01/03/25 General:    AA female in NAD Neurologic:  Alert and oriented,  grossly normal neurologically. Psych:  Cooperative. Normal mood and affect.  Intake/Output from previous day: 02/05 0701 - 02/06 0700 In: 376.9 [I.V.:376.9] Out: -  Intake/Output this shift: No intake/output data recorded.  Lab Results: Recent Labs    01/04/25 2116 01/05/25 0443 01/06/25 0502  WBC 15.2* 15.7* 16.3*  HGB 9.2* 9.3* 9.4*  HCT 29.4* 29.5* 30.4*  PLT 571* 614* 567*   BMET Recent Labs    01/04/25 0341 01/05/25 0443 01/06/25 0500  NA 135 138 136  K 3.9 3.9 4.2  CL 100 102 102  CO2 27 26 25   GLUCOSE 114* 84 171*  BUN 33* 20 13  CREATININE 1.26* 1.15* 1.20*  CALCIUM  9.0 9.1 8.8*   LFT Recent Labs    01/06/25 0500  ALBUMIN  3.2*   PT/INR Recent Labs    01/05/25 0443 01/06/25 0502  LABPROT 25.3* 22.8*  INR 2.2* 1.9*    Studies/Results: No results found.     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  Hemoglobin is stable, she is tolerating clear liquid diet.  I think okay to advance to soft diet today.  Moving forward would keep INR at lowest acceptable range from the LVAD team.  She is at risk for recurrent AVMs moving  forward, she should keep an eye on her stool monitor for bleeding in the future.  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  Recommended Protonix  40 mg twice daily.  Signing off at this time, call with questions or concerns moving forward.  Karen Naval, MD [facility]  "

## 2025-01-06 NOTE — TOC Progression Note (Signed)
 Transition of Care [facility]) - Progression Note    Patient Details  Name: Karen Warner MRN: [id number] Date of Birth: [birth year]  [facility] CM/SW Contact  Arlana JINNY Moats, LCSWA Phone [phone] 01/06/2025, 10:28 AM  Clinical Narrative:   Per chart review, patient is not medically stable or ready for dc. ICM will continue to follow.   HF CSW/Unit CM will continue to follow and monitor dc readiness.     Expected Discharge Plan: Home/Self Care Barriers to Discharge: Continued Medical Work up               Expected Discharge Plan and Services       Living arrangements for the past 2 months: Single Family Home                                       Social Drivers of Health (SDOH) Interventions SDOH Screenings   Food Insecurity: No Food Insecurity (12/15/2024)  Housing: Low Risk (12/17/2024)  Transportation Needs: No Transportation Needs (12/17/2024)  Utilities: Not At Risk (12/17/2024)  Depression (PHQ2-9): Low Risk (05/26/2024)  Financial Resource Strain: Low Risk (12/07/2024)   Received from [facility]  Social Connections: Moderately Integrated (12/17/2024)  Tobacco Use: Medium Risk (01/05/2025)    Readmission Risk Interventions     No data to display

## 2025-02-16 ENCOUNTER — Ambulatory Visit: Admitting: Podiatry

## 2025-03-31 ENCOUNTER — Ambulatory Visit

## 2025-06-06 ENCOUNTER — Inpatient Hospital Stay

## 2025-06-06 ENCOUNTER — Inpatient Hospital Stay: Admitting: Oncology

## 2025-06-30 ENCOUNTER — Ambulatory Visit
